# Patient Record
Sex: Male | Born: 1945 | Race: Black or African American | Hispanic: No | Marital: Married | Smoking: Former smoker
Health system: Southern US, Community
[De-identification: ages and names within clinical notes are randomized; demographics above are authoritative.]

## PROBLEM LIST (undated history)

## (undated) DIAGNOSIS — M1990 Unspecified osteoarthritis, unspecified site: Secondary | ICD-10-CM

## (undated) DIAGNOSIS — I9789 Other postprocedural complications and disorders of the circulatory system, not elsewhere classified: Secondary | ICD-10-CM

## (undated) DIAGNOSIS — M109 Gout, unspecified: Secondary | ICD-10-CM

## (undated) DIAGNOSIS — N183 Chronic kidney disease, stage 3 unspecified: Secondary | ICD-10-CM

## (undated) DIAGNOSIS — G4733 Obstructive sleep apnea (adult) (pediatric): Secondary | ICD-10-CM

## (undated) DIAGNOSIS — I4891 Unspecified atrial fibrillation: Secondary | ICD-10-CM

## (undated) DIAGNOSIS — I2510 Atherosclerotic heart disease of native coronary artery without angina pectoris: Secondary | ICD-10-CM

## (undated) DIAGNOSIS — E7800 Pure hypercholesterolemia, unspecified: Secondary | ICD-10-CM

## (undated) DIAGNOSIS — T8859XA Other complications of anesthesia, initial encounter: Secondary | ICD-10-CM

## (undated) DIAGNOSIS — I10 Essential (primary) hypertension: Secondary | ICD-10-CM

## (undated) DIAGNOSIS — E119 Type 2 diabetes mellitus without complications: Secondary | ICD-10-CM

## (undated) DIAGNOSIS — G4730 Sleep apnea, unspecified: Secondary | ICD-10-CM

## (undated) DIAGNOSIS — K635 Polyp of colon: Secondary | ICD-10-CM

## (undated) DIAGNOSIS — Z9989 Dependence on other enabling machines and devices: Secondary | ICD-10-CM

## (undated) DIAGNOSIS — N471 Phimosis: Secondary | ICD-10-CM

## (undated) DIAGNOSIS — I350 Nonrheumatic aortic (valve) stenosis: Secondary | ICD-10-CM

## (undated) DIAGNOSIS — R011 Cardiac murmur, unspecified: Secondary | ICD-10-CM

## (undated) DIAGNOSIS — I340 Nonrheumatic mitral (valve) insufficiency: Secondary | ICD-10-CM

## (undated) HISTORY — DX: Nonrheumatic mitral (valve) insufficiency: I34.0

## (undated) HISTORY — DX: Unspecified atrial fibrillation: I48.91

## (undated) HISTORY — DX: Gout, unspecified: M10.9

## (undated) HISTORY — DX: Chronic kidney disease, stage 3 (moderate): N18.3

## (undated) HISTORY — DX: Cardiac murmur, unspecified: R01.1

## (undated) HISTORY — DX: Other postprocedural complications and disorders of the circulatory system, not elsewhere classified: I97.89

## (undated) HISTORY — DX: Chronic kidney disease, stage 3 unspecified: N18.30

## (undated) HISTORY — DX: Essential (primary) hypertension: I10

## (undated) HISTORY — PX: EYE SURGERY: SHX253

## (undated) HISTORY — DX: Sleep apnea, unspecified: G47.30

## (undated) HISTORY — DX: Nonrheumatic aortic (valve) stenosis: I35.0

## (undated) HISTORY — DX: Polyp of colon: K63.5

## (undated) HISTORY — PX: CARDIAC CATHETERIZATION: SHX172

---

## 1998-08-11 ENCOUNTER — Ambulatory Visit (HOSPITAL_COMMUNITY): Admission: RE | Admit: 1998-08-11 | Discharge: 1998-08-11 | Payer: Self-pay | Admitting: *Deleted

## 2001-01-03 ENCOUNTER — Encounter: Admission: RE | Admit: 2001-01-03 | Discharge: 2001-02-22 | Payer: Self-pay | Admitting: Emergency Medicine

## 2006-07-26 HISTORY — PX: COLONOSCOPY: SHX174

## 2006-10-28 ENCOUNTER — Ambulatory Visit: Payer: Self-pay | Admitting: Gastroenterology

## 2006-11-04 ENCOUNTER — Ambulatory Visit: Payer: Self-pay | Admitting: Gastroenterology

## 2006-11-04 ENCOUNTER — Encounter (INDEPENDENT_AMBULATORY_CARE_PROVIDER_SITE_OTHER): Payer: Self-pay | Admitting: Specialist

## 2008-07-24 ENCOUNTER — Encounter: Admission: RE | Admit: 2008-07-24 | Discharge: 2008-07-24 | Payer: Self-pay | Admitting: Family Medicine

## 2010-12-08 ENCOUNTER — Encounter: Payer: BC Managed Care – PPO | Attending: Family Medicine | Admitting: *Deleted

## 2010-12-08 DIAGNOSIS — Z713 Dietary counseling and surveillance: Secondary | ICD-10-CM | POA: Insufficient documentation

## 2010-12-08 DIAGNOSIS — E119 Type 2 diabetes mellitus without complications: Secondary | ICD-10-CM | POA: Insufficient documentation

## 2011-07-16 ENCOUNTER — Ambulatory Visit (INDEPENDENT_AMBULATORY_CARE_PROVIDER_SITE_OTHER): Payer: BC Managed Care – PPO | Admitting: Family Medicine

## 2011-07-16 DIAGNOSIS — I10 Essential (primary) hypertension: Secondary | ICD-10-CM

## 2011-07-16 DIAGNOSIS — IMO0001 Reserved for inherently not codable concepts without codable children: Secondary | ICD-10-CM

## 2011-07-16 DIAGNOSIS — M109 Gout, unspecified: Secondary | ICD-10-CM

## 2011-08-03 ENCOUNTER — Ambulatory Visit: Payer: 59

## 2011-08-03 DIAGNOSIS — Z23 Encounter for immunization: Secondary | ICD-10-CM

## 2011-08-12 ENCOUNTER — Encounter: Payer: Self-pay | Admitting: Family Medicine

## 2011-08-12 DIAGNOSIS — G4733 Obstructive sleep apnea (adult) (pediatric): Secondary | ICD-10-CM | POA: Insufficient documentation

## 2011-08-12 DIAGNOSIS — M109 Gout, unspecified: Secondary | ICD-10-CM | POA: Insufficient documentation

## 2011-08-12 DIAGNOSIS — I10 Essential (primary) hypertension: Secondary | ICD-10-CM

## 2011-08-12 DIAGNOSIS — E7800 Pure hypercholesterolemia, unspecified: Secondary | ICD-10-CM | POA: Insufficient documentation

## 2011-08-12 DIAGNOSIS — E1165 Type 2 diabetes mellitus with hyperglycemia: Secondary | ICD-10-CM

## 2011-08-27 ENCOUNTER — Ambulatory Visit (INDEPENDENT_AMBULATORY_CARE_PROVIDER_SITE_OTHER): Payer: 59 | Admitting: Family Medicine

## 2011-08-27 ENCOUNTER — Encounter: Payer: Self-pay | Admitting: Family Medicine

## 2011-08-27 DIAGNOSIS — M109 Gout, unspecified: Secondary | ICD-10-CM

## 2011-08-27 DIAGNOSIS — E785 Hyperlipidemia, unspecified: Secondary | ICD-10-CM

## 2011-08-27 DIAGNOSIS — E1165 Type 2 diabetes mellitus with hyperglycemia: Secondary | ICD-10-CM

## 2011-08-27 DIAGNOSIS — E7800 Pure hypercholesterolemia, unspecified: Secondary | ICD-10-CM

## 2011-08-27 DIAGNOSIS — I10 Essential (primary) hypertension: Secondary | ICD-10-CM

## 2011-08-27 LAB — COMPREHENSIVE METABOLIC PANEL
Alkaline Phosphatase: 50 U/L (ref 39–117)
Creat: 1.35 mg/dL (ref 0.50–1.35)
Glucose, Bld: 205 mg/dL — ABNORMAL HIGH (ref 70–99)
Sodium: 141 mEq/L (ref 135–145)
Total Bilirubin: 0.4 mg/dL (ref 0.3–1.2)
Total Protein: 7.2 g/dL (ref 6.0–8.3)

## 2011-08-27 LAB — LIPID PANEL
Cholesterol: 157 mg/dL (ref 0–200)
Total CHOL/HDL Ratio: 3.7 Ratio

## 2011-08-27 MED ORDER — LOSARTAN POTASSIUM-HCTZ 100-12.5 MG PO TABS: 1.0000 | ORAL_TABLET | Freq: Every day | ORAL | Status: DC

## 2011-08-27 NOTE — Assessment & Plan Note (Addendum)
Will check home cbgs at various times, and rtc in about 6-7 wks to recheck hgb a1c  Work on diet for wt loss.  Paper rx given for testing strips

## 2011-08-27 NOTE — Progress Notes (Signed)
  Subjective:    Patient ID: Antonio Wells, male    DOB: Oct 23, 1945, 66 y.o.   MRN: [id number]  HPI Antonio Wells is a 66 y.o. male  DM - checking cbg 110-120 in am.  No symptomatic lows.  Rare missed dose metformin, increased to bid last ov due to A1c 9.1.  Exercise 5 d/week.    HTN - No outside BP's recorded, no missed doses. Cr 1.28-->1.39at 07/07/11 ov.  Fasting for labs today.  Gout - Uric acid 9.0 on 07/15/12.  2 flairs in past year,  Last one August. Hasn't purchased Colcrys yet due to cost. Would prefer to treat flairs only instead of daily med.   Review of Systems  Constitutional: Negative for fever, chills, diaphoresis and unexpected weight change.  Respiratory: Negative for cough, chest tightness and shortness of breath.   Cardiovascular: Negative for chest pain and palpitations.  Gastrointestinal: Negative for blood in stool.  Genitourinary: Negative for hematuria.  Musculoskeletal: Positive for arthralgias. Negative for gait problem.       R knee with bike riding, but no flairs of gout.  Knee pain few months.  Skin: Negative for rash.  Neurological: Negative for dizziness, syncope and light-headedness.  Hematological: Does not bruise/bleed easily.       Objective:   Physical Exam  Constitutional: He is oriented to person, place, and time. He appears well-developed and well-nourished.  HENT:  Head: Normocephalic and atraumatic.  Eyes: Pupils are equal, round, and reactive to light.  Neck: Normal range of motion. Neck supple. No JVD present.  Cardiovascular: Normal rate, regular rhythm and intact distal pulses.  Exam reveals no gallop and no friction rub.   No murmur heard.      No bruit.  Pulmonary/Chest: Effort normal and breath sounds normal. He has no rales.  Abdominal: Soft.  Musculoskeletal: Normal range of motion. He exhibits no edema and no tenderness.  Neurological: He is alert and oriented to person, place, and time.  Skin: Skin is warm and dry.    Psychiatric: He has a normal mood and affect.          Assessment & Plan:

## 2011-08-27 NOTE — Assessment & Plan Note (Addendum)
Uncontrolled for DM.  Will incr doses today of losartan.  Slight elevated creatinine last ov - will recheck today, and if still elevated, may need to check renal U/S, or stop the ARB

## 2011-08-27 NOTE — Assessment & Plan Note (Signed)
Tolerating lovastatin.  Has been taking last 3 weeks.  Fasting labs today.

## 2011-08-27 NOTE — Patient Instructions (Signed)
See handout.  Check blood sugars, and outside blood pressures.  Recheck office visit in 6-8 weeks, sooner if any new sx's, or worsening.  Gout Gout is an inflammatory condition (arthritis) caused by a buildup of uric acid crystals in the joints. Uric acid is a chemical that is normally present in the blood. Under some circumstances, uric acid can form into crystals in your joints. This causes joint redness, soreness, and swelling (inflammation). Repeat attacks are common. Over time, uric acid crystals can form into masses (tophi) near a joint, causing disfigurement. Gout is treatable and often preventable. CAUSES  The disease begins with elevated levels of uric acid in the blood. Uric acid is produced by your body when it breaks down a naturally found substance called purines. This also happens when you eat certain foods such as meats and fish. Causes of an elevated uric acid level include:  Being passed down from parent to child (heredity).   Diseases that cause increased uric acid production (obesity, psoriasis, some cancers).   Excessive alcohol use.   Diet, especially diets rich in meat and seafood.   Medicines, including certain cancer-fighting drugs (chemotherapy), diuretics, and aspirin.   Chronic kidney disease. The kidneys are no longer able to remove uric acid well.   Problems with metabolism.  Conditions strongly associated with gout include:  Obesity.   High blood pressure.   High cholesterol.   Diabetes.  Not everyone with elevated uric acid levels gets gout. It is not understood why some people get gout and others do not. Surgery, joint injury, and eating too much of certain foods are some of the factors that can lead to gout. SYMPTOMS   An attack of gout comes on quickly. It causes intense pain with redness, swelling, and warmth in a joint.   Fever can occur.   Often, only one joint is involved. Certain joints are more commonly involved:   Base of the big toe.    Knee.   Ankle.   Wrist.   Finger.  Without treatment, an attack usually goes away in a few days to weeks. Between attacks, you usually will not have symptoms, which is different from many other forms of arthritis. DIAGNOSIS  Your caregiver will suspect gout based on your symptoms and exam. Removal of fluid from the joint (arthrocentesis) is done to check for uric acid crystals. Your caregiver will give you a medicine that numbs the area (local anesthetic) and use a needle to remove joint fluid for exam. Gout is confirmed when uric acid crystals are seen in joint fluid, using a special microscope. Sometimes, blood, urine, and X-ray tests are also used. TREATMENT  There are 2 phases to gout treatment: treating the sudden onset (acute) attack and preventing attacks (prophylaxis). Treatment of an Acute Attack  Medicines are used. These include anti-inflammatory medicines or steroid medicines.   An injection of steroid medicine into the affected joint is sometimes necessary.   The painful joint is rested. Movement can worsen the arthritis.   You may use warm or cold treatments on painful joints, depending which works best for you.   Discuss the use of coffee, vitamin C, or cherries with your caregiver. These may be helpful treatment options.  Treatment to Prevent Attacks After the acute attack subsides, your caregiver may advise prophylactic medicine. These medicines either help your kidneys eliminate uric acid from your body or decrease your uric acid production. You may need to stay on these medicines for a very long time. The  early phase of treatment with prophylactic medicine can be associated with an increase in acute gout attacks. For this reason, during the first few months of treatment, your caregiver may also advise you to take medicines usually used for acute gout treatment. Be sure you understand your caregiver's directions. You should also discuss dietary treatment with your  caregiver. Certain foods such as meats and fish can increase uric acid levels. Other foods such as dairy can decrease levels. Your caregiver can give you a list of foods to avoid. HOME CARE INSTRUCTIONS   Do not take aspirin to relieve pain. This raises uric acid levels.   Only take over-the-counter or prescription medicines for pain, discomfort, or fever as directed by your caregiver.   Rest the joint as much as possible. When in bed, keep sheets and blankets off painful areas.   Keep the affected joint raised (elevated).   Use crutches if the painful joint is in your leg.   Drink enough water and fluids to keep your urine clear or pale yellow. This helps your body get rid of uric acid. Do not drink alcoholic beverages. They slow the passage of uric acid.   Follow your caregiver's dietary instructions. Pay careful attention to the amount of protein you eat. Your daily diet should emphasize fruits, vegetables, whole grains, and fat-free or low-fat milk products.   Maintain a healthy body weight.  SEEK MEDICAL CARE IF:   You have an oral temperature above 102 F (38.9 C).   You develop diarrhea, vomiting, or any side effects from medicines.   You do not feel better in 24 hours, or you are getting worse.  SEEK IMMEDIATE MEDICAL CARE IF:   Your joint becomes suddenly more tender and you have:   Chills.   An oral temperature above 102 F (38.9 C), not controlled by medicine.  MAKE SURE YOU:   Understand these instructions.   Will watch your condition.   Will get help right away if you are not doing well or get worse.  Document Released: 07/09/2000 Document Revised: 03/24/2011 Document Reviewed: 10/20/2009 [facility] Patient Information 2012 [place], [place].

## 2011-08-27 NOTE — Assessment & Plan Note (Addendum)
Discussed daily allopurinol.  Wishes to treat acute flairs with colcrys.  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 Paper Rx and coupon for 7days given.

## 2011-09-01 ENCOUNTER — Telehealth: Payer: Self-pay

## 2011-09-01 NOTE — Telephone Encounter (Signed)
.  [facility]  PT IS REQUESTING WE SEND HIS PRESCRIPTION FOR DIABETIC STRIPS TO [facility]  1 TOUCH ULTRA

## 2011-09-04 ENCOUNTER — Other Ambulatory Visit: Payer: Self-pay

## 2011-09-04 DIAGNOSIS — E119 Type 2 diabetes mellitus without complications: Secondary | ICD-10-CM

## 2011-09-04 MED ORDER — GLUCOSE BLOOD VI STRP: ORAL_STRIP | Status: DC

## 2011-09-04 NOTE — Telephone Encounter (Signed)
Please RF if indicated.

## 2011-10-06 ENCOUNTER — Other Ambulatory Visit: Payer: Self-pay | Admitting: Family Medicine

## 2011-10-22 ENCOUNTER — Encounter: Payer: Self-pay | Admitting: Family Medicine

## 2011-10-22 ENCOUNTER — Ambulatory Visit (INDEPENDENT_AMBULATORY_CARE_PROVIDER_SITE_OTHER): Payer: 59 | Admitting: Family Medicine

## 2011-10-22 VITALS — BP 135/78 | HR 76 | Temp 97.7°F | Resp 18 | Ht 62.5 in | Wt 204.0 lb

## 2011-10-22 DIAGNOSIS — I10 Essential (primary) hypertension: Secondary | ICD-10-CM

## 2011-10-22 DIAGNOSIS — E119 Type 2 diabetes mellitus without complications: Secondary | ICD-10-CM

## 2011-10-22 LAB — BASIC METABOLIC PANEL
CO2: 27 mEq/L (ref 19–32)
Chloride: 102 mEq/L (ref 96–112)
Potassium: 4 mEq/L (ref 3.5–5.3)
Sodium: 139 mEq/L (ref 135–145)

## 2011-10-22 LAB — POCT GLYCOSYLATED HEMOGLOBIN (HGB A1C): Hemoglobin A1C: 7.4

## 2011-10-22 MED ORDER — GLIPIZIDE 10 MG PO TABS: 10.0000 mg | ORAL_TABLET | Freq: Every day | ORAL | Status: DC

## 2011-10-22 MED ORDER — METFORMIN HCL 500 MG PO TABS: 500.0000 mg | ORAL_TABLET | Freq: Two times a day (BID) | ORAL | Status: DC

## 2011-10-22 NOTE — Progress Notes (Signed)
Subjective:    Patient ID: ABHIRAJ DOZAL, male    DOB: [birth year], 66 y.o.   MRN: [id number]  HPI DM- tolerating meds.  Home readings 145-165  Chol.-- no new myalgias, or se's.   Results for orders placed in visit on 08/27/11  LIPID PANEL      Component Value Range   Cholesterol 157  0 - 200 (mg/dL)   Triglycerides 956 (*) <150 (mg/dL)   HDL 42  >21 (mg/dL)   Total CHOL/HDL Ratio 3.7     VLDL 32  0 - 40 (mg/dL)   LDL Cholesterol 83  0 - 99 (mg/dL)  COMPREHENSIVE METABOLIC PANEL      Component Value Range   Sodium 141  135 - 145 (mEq/L)   Potassium 4.4  3.5 - 5.3 (mEq/L)   Chloride 103  96 - 112 (mEq/L)   CO2 28  19 - 32 (mEq/L)   Glucose, Bld 205 (*) 70 - 99 (mg/dL)   BUN 18  6 - 23 (mg/dL)   Creat 3.08  6.57 - 8.46 (mg/dL)   Total Bilirubin 0.4  0.3 - 1.2 (mg/dL)   Alkaline Phosphatase 50  39 - 117 (U/L)   AST 20  0 - 37 (U/L)   ALT 30  0 - 53 (U/L)   Total Protein 7.2  6.0 - 8.3 (g/dL)   Albumin 4.7  3.5 - 5.2 (g/dL)   Calcium 9.4  8.4 - 96.2 (mg/dL)    HTn - no recent outside BP's. Borderline creat last ov.  Trend 1.28 to 1.39 to 1.35.  Review of Systems  Constitutional: Negative for fatigue and unexpected weight change.  Eyes: Negative for visual disturbance.  Respiratory: Negative for cough, chest tightness and shortness of breath.        Minimal cough - occasional, not daily.  Cardiovascular: Negative for chest pain, palpitations and leg swelling.  Gastrointestinal: Negative for abdominal pain and blood in stool.  Neurological: Negative for dizziness, light-headedness and headaches.       Objective:   Physical Exam  Constitutional: He is oriented to person, place, and time. He appears well-developed and well-nourished.  HENT:  Head: Normocephalic and atraumatic.  Eyes: EOM are normal. Pupils are equal, round, and reactive to light.  Neck: No JVD present. Carotid bruit is not present.  Cardiovascular: Normal rate, regular rhythm, normal heart sounds and  intact distal pulses.   No murmur heard. Pulmonary/Chest: Effort normal and breath sounds normal. He has no rales.  Abdominal: Soft. There is no tenderness.  Musculoskeletal: He exhibits no edema.  Neurological: He is alert and oriented to person, place, and time.       Microfilament testing of feet normal bilaterally.  Skin: Skin is warm, dry and intact. No rash noted.  Psychiatric: He has a normal mood and affect. His behavior is normal.   Results for orders placed in visit on 10/22/11  GLUCOSE, POCT (MANUAL RESULT ENTRY)      Component Value Range   POC Glucose 208    POCT GLYCOSYLATED HEMOGLOBIN (HGB A1C)      Component Value Range   Hemoglobin A1C 7.4           Assessment & Plan:   PAZ WINSETT is a 66 y.o. male 1. DM2 (diabetes mellitus, type 2)  POCT glucose (manual entry), POCT glycosylated hemoglobin (Hb A1C)  2. HTN (hypertension)  Basic metabolic panel    DM -  Still uncontrolled, but improving.  Cont same doses of  meds, including metformin, glipizide and ARB, and work on diet, and keep record of cbg's.   Recheck 3 months.  HTN, borderline creat prior.  Improved control.  Check BMP.  Recheck in 3 months.

## 2011-10-22 NOTE — Patient Instructions (Signed)
Continue  To watch diet and same meds for now.   REcheck in 3 months.

## 2011-11-08 ENCOUNTER — Encounter: Payer: Self-pay | Admitting: Gastroenterology

## 2011-11-24 ENCOUNTER — Other Ambulatory Visit: Payer: Self-pay | Admitting: Family Medicine

## 2011-11-29 ENCOUNTER — Other Ambulatory Visit: Payer: Self-pay

## 2012-01-30 ENCOUNTER — Other Ambulatory Visit: Payer: Self-pay | Admitting: Family Medicine

## 2012-01-31 ENCOUNTER — Other Ambulatory Visit: Payer: Self-pay | Admitting: Family Medicine

## 2012-02-11 ENCOUNTER — Encounter: Payer: Self-pay | Admitting: Family Medicine

## 2012-02-11 ENCOUNTER — Ambulatory Visit (INDEPENDENT_AMBULATORY_CARE_PROVIDER_SITE_OTHER): Payer: 59 | Admitting: Family Medicine

## 2012-02-11 VITALS — BP 136/79 | HR 77 | Temp 97.7°F | Resp 16 | Ht 63.0 in | Wt 207.0 lb

## 2012-02-11 DIAGNOSIS — E1165 Type 2 diabetes mellitus with hyperglycemia: Secondary | ICD-10-CM

## 2012-02-11 DIAGNOSIS — I10 Essential (primary) hypertension: Secondary | ICD-10-CM

## 2012-02-11 LAB — POCT GLYCOSYLATED HEMOGLOBIN (HGB A1C): Hemoglobin A1C: 7.4

## 2012-02-11 LAB — BASIC METABOLIC PANEL
BUN: 17 mg/dL (ref 6–23)
Calcium: 9.5 mg/dL (ref 8.4–10.5)
Creat: 1.3 mg/dL (ref 0.50–1.35)

## 2012-02-11 LAB — GLUCOSE, POCT (MANUAL RESULT ENTRY): POC Glucose: 149 mg/dl — AB (ref 70–99)

## 2012-02-11 NOTE — Patient Instructions (Signed)
Your should receive a call or letter about your lab results within the next week to 10 days.  Continue same doses of meds for now, but work on diet and exercise for weight loss.  If A1c remains elevated at next office visit we may need to change doses.  Keep a record of your blood pressures outside of the office and bring them to the next office visit.

## 2012-02-11 NOTE — Progress Notes (Signed)
Subjective:    Patient ID: Antonio Wells, male    DOB: [birth year], 66 y.o.   MRN: [id number]  HPI Antonio Wells is a 66 y.o. male HTn - hx borderline creat.  Trend 1.28 to 1.39 to 1.35.  1.41 last ov. Drinking water frequently.  No difficulty urinating.  Physical in next few months. No new side effects with meds.  No outside BP readings.  Dm2 - last labs 3/29:  No recent home blood sugars. Did cut back on exercise due to knee pain.  Restarting exercise recently. No missed doses.    Results for orders placed in visit on 10/22/11  GLUCOSE, POCT (MANUAL RESULT ENTRY)      Component Value Range   POC Glucose 208    POCT GLYCOSYLATED HEMOGLOBIN (HGB A1C)      Component Value Range   Hemoglobin A1C 7.4    BASIC METABOLIC PANEL      Component Value Range   Sodium 139  135 - 145 mEq/L   Potassium 4.0  3.5 - 5.3 mEq/L   Chloride 102  96 - 112 mEq/L   CO2 27  19 - 32 mEq/L   Glucose, Bld 230 (*) 70 - 99 mg/dL   BUN 16  6 - 23 mg/dL   Creat 4.09 (*) 8.11 - 1.35 mg/dL   Calcium 9.3  8.4 - 91.4 mg/dL   Review of Systems  Constitutional: Negative for fatigue and unexpected weight change.  Eyes: Negative for visual disturbance.  Respiratory: Negative for cough, chest tightness and shortness of breath.   Cardiovascular: Negative for chest pain, palpitations and leg swelling.  Gastrointestinal: Negative for abdominal pain and blood in stool.  Neurological: Negative for dizziness, light-headedness and headaches.       Objective:   Physical Exam  Constitutional: He is oriented to person, place, and time. He appears well-developed and well-nourished.  HENT:  Head: Normocephalic and atraumatic.  Eyes: Pupils are equal, round, and reactive to light.  Cardiovascular: Normal rate, regular rhythm, normal heart sounds and intact distal pulses.   Pulmonary/Chest: Effort normal and breath sounds normal.  Abdominal: Soft. Normal appearance. He exhibits no abdominal bruit and no pulsatile  midline mass. There is no tenderness.  Neurological: He is alert and oriented to person, place, and time.       Microfilament testing of feet normal bilaterally.  Skin: Skin is warm, dry and intact. No rash noted.  Psychiatric: He has a normal mood and affect. His behavior is normal.    Results for orders placed in visit on 02/11/12  GLUCOSE, POCT (MANUAL RESULT ENTRY)      Component Value Range   POC Glucose 149 (*) 70 - 99 mg/dl  POCT GLYCOSYLATED HEMOGLOBIN (HGB A1C)      Component Value Range   Hemoglobin A1C 7.4         Assessment & Plan:  Antonio Wells is a 66 y.o. male  HTN - borderline high, with prior borderline creat.  Recheck BMP.  Outside readings to next ov.  Cont same doses for now.  unknown if rf's needed - ok to refill x 6 months on meds.     DM2 - uncontrolled, slight weight gain with decrease in exercise, but now restarting exercise regimen.  Can keep doses same if working on weight loss and exercise, but consider increasing next ov in 3 months if still elevated.    Plan on lipids at next ov - schedule physical in next 3 months.

## 2012-02-18 ENCOUNTER — Other Ambulatory Visit: Payer: Self-pay | Admitting: Family Medicine

## 2012-02-18 NOTE — Telephone Encounter (Signed)
PT STATES HE HAVE GOUT AND WOULD LIKE TO HAVE SOMETHING CALLED IN. PLEASE CALL PT AT [phone]    [facility] [address]

## 2012-02-20 ENCOUNTER — Telehealth: Payer: Self-pay | Admitting: *Deleted

## 2012-02-20 MED ORDER — COLCHICINE 0.6 MG PO TABS: ORAL_TABLET | ORAL | Status: DC

## 2012-02-20 NOTE — Telephone Encounter (Signed)
Con Memos Kaba 02/18/2012 11:33 AM Signed  PT STATES HE HAVE GOUT AND WOULD LIKE TO HAVE SOMETHING CALLED IN. PLEASE CALL PT AT [phone]  [facility] [address]

## 2012-02-20 NOTE — Telephone Encounter (Signed)
Sent colcrys into pharmacy

## 2012-02-20 NOTE — Telephone Encounter (Signed)
PT NOTIFIED THAT RX WAS SENT INTO PHARMACY 

## 2012-04-25 ENCOUNTER — Other Ambulatory Visit: Payer: Self-pay | Admitting: Physician Assistant

## 2012-04-25 ENCOUNTER — Other Ambulatory Visit: Payer: Self-pay | Admitting: Family Medicine

## 2012-04-29 ENCOUNTER — Other Ambulatory Visit: Payer: Self-pay | Admitting: Physician Assistant

## 2012-04-30 ENCOUNTER — Other Ambulatory Visit: Payer: Self-pay | Admitting: Physician Assistant

## 2012-05-08 ENCOUNTER — Encounter: Payer: 59 | Admitting: Family Medicine

## 2012-05-12 ENCOUNTER — Encounter: Payer: 59 | Admitting: Family Medicine

## 2012-06-09 ENCOUNTER — Other Ambulatory Visit: Payer: Self-pay | Admitting: Family Medicine

## 2012-07-03 ENCOUNTER — Encounter: Payer: Self-pay | Admitting: Family Medicine

## 2012-07-03 ENCOUNTER — Ambulatory Visit (INDEPENDENT_AMBULATORY_CARE_PROVIDER_SITE_OTHER): Payer: 59 | Admitting: Family Medicine

## 2012-07-03 VITALS — BP 128/70 | HR 69 | Temp 98.3°F | Resp 16 | Ht 62.75 in | Wt 204.4 lb

## 2012-07-03 DIAGNOSIS — E1165 Type 2 diabetes mellitus with hyperglycemia: Secondary | ICD-10-CM

## 2012-07-03 DIAGNOSIS — Z23 Encounter for immunization: Secondary | ICD-10-CM

## 2012-07-03 DIAGNOSIS — M25559 Pain in unspecified hip: Secondary | ICD-10-CM

## 2012-07-03 DIAGNOSIS — Z0000 Encounter for general adult medical examination without abnormal findings: Secondary | ICD-10-CM

## 2012-07-03 DIAGNOSIS — M109 Gout, unspecified: Secondary | ICD-10-CM

## 2012-07-03 DIAGNOSIS — IMO0002 Reserved for concepts with insufficient information to code with codable children: Secondary | ICD-10-CM

## 2012-07-03 DIAGNOSIS — M25569 Pain in unspecified knee: Secondary | ICD-10-CM

## 2012-07-03 DIAGNOSIS — E785 Hyperlipidemia, unspecified: Secondary | ICD-10-CM

## 2012-07-03 DIAGNOSIS — I10 Essential (primary) hypertension: Secondary | ICD-10-CM

## 2012-07-03 LAB — COMPREHENSIVE METABOLIC PANEL
ALT: 31 U/L (ref 0–53)
Albumin: 4.8 g/dL (ref 3.5–5.2)
Alkaline Phosphatase: 49 U/L (ref 39–117)
CO2: 29 mEq/L (ref 19–32)
Glucose, Bld: 179 mg/dL — ABNORMAL HIGH (ref 70–99)
Potassium: 4.1 mEq/L (ref 3.5–5.3)
Sodium: 140 mEq/L (ref 135–145)
Total Bilirubin: 0.5 mg/dL (ref 0.3–1.2)
Total Protein: 7.1 g/dL (ref 6.0–8.3)

## 2012-07-03 LAB — CBC WITH DIFFERENTIAL/PLATELET
Eosinophils Absolute: 0.1 10*3/uL (ref 0.0–0.7)
Hemoglobin: 14.5 g/dL (ref 13.0–17.0)
Lymphs Abs: 2 10*3/uL (ref 0.7–4.0)
MCH: 30.5 pg (ref 26.0–34.0)
Monocytes Relative: 7 % (ref 3–12)
Neutro Abs: 1.3 10*3/uL — ABNORMAL LOW (ref 1.7–7.7)
Neutrophils Relative %: 35 % — ABNORMAL LOW (ref 43–77)
Platelets: 221 10*3/uL (ref 150–400)
RBC: 4.76 MIL/uL (ref 4.22–5.81)
WBC: 3.6 10*3/uL — ABNORMAL LOW (ref 4.0–10.5)

## 2012-07-03 LAB — GLUCOSE, POCT (MANUAL RESULT ENTRY): POC Glucose: 175 mg/dl — AB (ref 70–99)

## 2012-07-03 LAB — POCT GLYCOSYLATED HEMOGLOBIN (HGB A1C): Hemoglobin A1C: 7.9

## 2012-07-03 LAB — LIPID PANEL
Cholesterol: 159 mg/dL (ref 0–200)
LDL Cholesterol: 81 mg/dL (ref 0–99)
VLDL: 30 mg/dL (ref 0–40)

## 2012-07-03 LAB — IFOBT (OCCULT BLOOD): IFOBT: NEGATIVE

## 2012-07-03 MED ORDER — FELODIPINE ER 10 MG PO TB24: 10.0000 mg | ORAL_TABLET | Freq: Every day | ORAL | Status: DC

## 2012-07-03 MED ORDER — METOPROLOL SUCCINATE ER 100 MG PO TB24: 100.0000 mg | ORAL_TABLET | Freq: Every day | ORAL | Status: DC

## 2012-07-03 MED ORDER — LOVASTATIN 20 MG PO TABS: 20.0000 mg | ORAL_TABLET | Freq: Every day | ORAL | Status: DC

## 2012-07-03 MED ORDER — LOSARTAN POTASSIUM-HCTZ 100-12.5 MG PO TABS: 1.0000 | ORAL_TABLET | Freq: Every day | ORAL | Status: DC

## 2012-07-03 MED ORDER — METFORMIN HCL 500 MG PO TABS: ORAL_TABLET | ORAL | Status: DC

## 2012-07-03 MED ORDER — COLCHICINE 0.6 MG PO TABS: ORAL_TABLET | ORAL | Status: DC

## 2012-07-03 MED ORDER — GLIPIZIDE 10 MG PO TABS: 10.0000 mg | ORAL_TABLET | Freq: Two times a day (BID) | ORAL | Status: DC

## 2012-07-03 NOTE — Progress Notes (Signed)
Subjective:    Patient ID: Antonio Wells, male    DOB: [birth year], 66 y.o.   MRN: [id number]  HPI Antonio Wells is a 66 y.o. male Hx of HTN, DM2, hyperlipidemia, gout, OSA.  Here for CPE.  Colonoscopy 5 years ago - due for repeat this year. Due for   DM2 - elevated last ov, but planning on diet and exercise changes.  No recent blood sugar checks.  No new side effects with meds. Only rare cough. - less than in past. Has not changed much in weight, minimal diet change.  Last optho visit - 1 year ago. Dentist: last visit 1 year ago.  Will have EKG today.  Results for orders placed in visit on 02/11/12  BASIC METABOLIC PANEL      Component Value Range   Sodium 140  135 - 145 mEq/L   Potassium 4.1  3.5 - 5.3 mEq/L   Chloride 104  96 - 112 mEq/L   CO2 26  19 - 32 mEq/L   Glucose, Bld 149 (*) 70 - 99 mg/dL   BUN 17  6 - 23 mg/dL   Creat 0.86  5.78 - 4.69 mg/dL   Calcium 9.5  8.4 - 62.9 mg/dL  GLUCOSE, POCT (MANUAL RESULT ENTRY)      Component Value Range   POC Glucose 149 (*) 70 - 99 mg/dl  POCT GLYCOSYLATED HEMOGLOBIN (HGB A1C)      Component Value Range   Hemoglobin A1C 7.4    has had some hip and knee pains at times. No attempted treatments. Hip pains for over a year - worse at times - noted with walking [place] theme park recently. 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  R knee pain without injury - on and off for few years,more recently and notes with going down stairs more than up.This has impacted exercise. Rare alleve - minimal relief.   No outside blood pressures.   Rare dry cough - better than in past when on different blood pressure med.  Hx of tobacco abuse in past - quit in 90's.  Brother had lung cancer- deceased in 12-25-2004.   Review of Systems  Constitutional: Negative for fatigue and unexpected weight change.  Eyes: Negative for visual disturbance.  Respiratory: Negative for cough (minimal - improving. ), chest tightness and shortness of breath.    Cardiovascular: Negative for chest pain, palpitations and leg swelling.  Gastrointestinal: Negative for abdominal pain and blood in stool.  Musculoskeletal: Positive for arthralgias.  Neurological: Negative for dizziness, light-headedness and headaches.  other per CMA note form Patient health survey.   EKG: L axis, nonspecific t waves inferiorly, but seen in Dec 26, 2002.     Objective:   Physical Exam  Constitutional: He is oriented to person, place, and time. He appears well-developed and well-nourished.  HENT:  Head: Normocephalic and atraumatic.  Right Ear: External ear normal.  Left Ear: External ear normal.  Mouth/Throat: Oropharynx is clear and moist.  Eyes: Conjunctivae normal and EOM are normal. Pupils are equal, round, and reactive to light.  Neck: Normal range of motion. Neck supple. No thyromegaly present.  Cardiovascular: Normal rate, regular rhythm, normal heart sounds and intact distal pulses.   Pulmonary/Chest: Effort normal and breath sounds normal. No respiratory distress. He has no wheezes.  Abdominal: Soft. He exhibits no distension. There is no tenderness. Hernia confirmed negative in the right inguinal area and confirmed negative in the left inguinal area.  Genitourinary: Prostate normal.  Musculoskeletal: Normal range of motion. He exhibits no edema and no tenderness.  Lymphadenopathy:    He has no cervical adenopathy.  Neurological: He is alert and oriented to person, place, and time. He has normal reflexes.  Skin: Skin is warm and dry.  Psychiatric: He has a normal mood and affect. His behavior is normal.      Results for orders placed in visit on 07/03/12  GLUCOSE, POCT (MANUAL RESULT ENTRY)      Component Value Range   POC Glucose 175 (*) 70 - 99 mg/dl  POCT GLYCOSYLATED HEMOGLOBIN (HGB A1C)      Component Value Range   Hemoglobin A1C 7.9    IFOBT (OCCULT BLOOD)      Component Value Range   IFOBT Negative         Assessment & Plan:  Antonio Wells  is a 66 y.o. male 1. Need for prophylactic vaccination and inoculation against influenza  Flu vaccine greater than or equal to 3yo preservative free IM  2. Annual physical exam  IFOBT POC (occult bld, rslt in office), CBC with Differential, PSA, TSH  3. HTN (hypertension)  felodipine (PLENDIL) 10 MG 24 hr tablet, losartan-hydrochlorothiazide (HYZAAR) 100-12.5 MG per tablet, metoprolol succinate (TOPROL-XL) 100 MG 24 hr tablet, Comprehensive metabolic panel  4. DM (diabetes mellitus), type 2, uncontrolled  glipiZIDE (GLUCOTROL) 10 MG tablet, POCT glucose (manual entry), POCT glycosylated hemoglobin (Hb A1C), metFORMIN (GLUCOPHAGE) 500 MG tablet  5. Gout  colchicine 0.6 MG tablet, Uric Acid  6. Knee pain    7. Hip pain    8. Hyperlipidemia  lovastatin (MEVACOR) 20 MG tablet, Lipid panel   CPE -  Pt will call and schedule repeat colonoscopy.  Flu vaccine given today, tdap, and pneumovax given.  Discussed zostavax - would like to check into this first - i can Rx this without ov if he wants to receive this.  Check labs above.   HTN - controlled. Continue same meds.   DM2 - uncontrolled - increase metformin to 1000mg  in the morning, 500mg  qhs - see rx. Recheck in 3 months.   Gout - no recent attack.  Hip, knee pain more likely OA, but gout in ddx. .  Will check uric acid.  Rare dry cough - FH of lung cancer, and remote tobacco abuse - recommended cxr - declined at present, but encouraged to rtc if he changes his mind or certainly if any new pulmonary sx's. Understanding expressed.   Bilaterral hip pain and R knee pain - longstanding.  Suspected arthritic change, can try otc tylenol and aquatic exercise, quad strengthening,  and recheck in next few weeks for possible xray and further eval.  Patient Instructions  Call your gastroenterologist to schedule the colonoscopy.  Your should receive a call or letter about your lab results within the next week to 10 days.  Increase the metformin to 2 each  morning, 1 each night. Recheck levels in 3 months.  Try tylenol for your knee and hip pain, water exercise as discussed and recheck in the next few weeks to discuss further.  Keeping you healthy  Get these tests  Blood pressure- Have your blood pressure checked once a year by your healthcare provider.  Normal blood pressure is 120/80  Weight- Have your body mass index (BMI) calculated to screen for obesity.  BMI is a measure of body fat based on height and weight. You can also calculate your own BMI at [email or web address].  Cholesterol- Have your cholesterol checked  every year.  Diabetes- Have your blood sugar checked regularly if you have high blood pressure, high cholesterol, have a family history of diabetes or if you are overweight.  Screening for Colon Cancer- Colonoscopy starting at age 60.  Screening may begin sooner depending on your family history and other health conditions. Follow up colonoscopy as directed by your Gastroenterologist.  Screening for Prostate Cancer- Both blood work (PSA) and a rectal exam help screen for Prostate Cancer.  Screening begins at age 6 with African-American men and at age 58 with Caucasian men.  Screening may begin sooner depending on your family history.  Take these medicines  Aspirin- One aspirin daily can help prevent Heart disease and Stroke.  Flu shot- Every fall.  Tetanus- Every 10 years.  Zostavax- Once after the age of 26 to prevent Shingles.  Pneumonia shot- Once after the age of 45; if you are younger than 8, ask your healthcare provider if you need a Pneumonia shot.  Take these steps  Don't smoke- If you do smoke, talk to your doctor about quitting.  For tips on how to quit, go to www.smokefree.gov or call 1-800-QUIT-NOW.  Be physically active- Exercise 5 days a week for at least 30 minutes.  If you are not already physically active start slow and gradually work up to 30 minutes of moderate physical activity.  Examples of  moderate activity include walking briskly, mowing the yard, dancing, swimming, bicycling, etc.  Eat a healthy diet- Eat a variety of healthy food such as fruits, vegetables, low fat milk, low fat cheese, yogurt, lean meant, poultry, fish, beans, tofu, etc. For more information go to www.thenutritionsource.org  Drink alcohol in moderation- Limit alcohol intake to less than two drinks a day. Never drink and drive.  Dentist- Brush and floss twice daily; visit your dentist twice a year.  Depression- Your emotional health is as important as your physical health. If you're feeling down, or losing interest in things you would normally enjoy please talk to your healthcare provider.  Eye exam- Visit your eye doctor every year.  Safe sex- If you may be exposed to a sexually transmitted infection, use a condom.  Seat belts- Seat belts can save your life; always wear one.  Smoke/Carbon Monoxide detectors- These detectors need to be installed on the appropriate level of your home.  Replace batteries at least once a year.  Skin cancer- When out in the sun, cover up and use sunscreen 15 SPF or higher.  Violence- If anyone is threatening you, please tell your healthcare provider.  Living Will/ Health care power of attorney- Speak with your healthcare provider and family.

## 2012-07-03 NOTE — Progress Notes (Signed)
  Subjective:    Patient ID: Antonio Wells, male    DOB: 1945/09/24, 66 y.o.   MRN: [id number]  HPI    Review of Systems  Constitutional: Negative.   HENT: Negative.   Eyes: Negative.   Respiratory: Negative.   Cardiovascular: Negative.   Gastrointestinal: Negative.   Genitourinary: Negative.   Musculoskeletal: Negative.   Skin: Negative.   Neurological: Negative.   Hematological: Negative.   Psychiatric/Behavioral: Negative.        Objective:   Physical Exam        Assessment & Plan:

## 2012-07-03 NOTE — Patient Instructions (Addendum)
Call your gastroenterologist to schedule the colonoscopy.  Your should receive a call or letter about your lab results within the next week to 10 days.  Increase the metformin to 2 each morning, 1 each night. Recheck levels in 3 months.  Try tylenol for your knee and hip pain, water exercise as discussed and recheck in the next few weeks to discuss further.  Keeping you healthy  Get these tests  Blood pressure- Have your blood pressure checked once a year by your healthcare provider.  Normal blood pressure is 120/80  Weight- Have your body mass index (BMI) calculated to screen for obesity.  BMI is a measure of body fat based on height and weight. You can also calculate your own BMI at [email or web address].  Cholesterol- Have your cholesterol checked every year.  Diabetes- Have your blood sugar checked regularly if you have high blood pressure, high cholesterol, have a family history of diabetes or if you are overweight.  Screening for Colon Cancer- Colonoscopy starting at age 5.  Screening may begin sooner depending on your family history and other health conditions. Follow up colonoscopy as directed by your Gastroenterologist.  Screening for Prostate Cancer- Both blood work (PSA) and a rectal exam help screen for Prostate Cancer.  Screening begins at age 32 with African-American men and at age 28 with Caucasian men.  Screening may begin sooner depending on your family history.  Take these medicines  Aspirin- One aspirin daily can help prevent Heart disease and Stroke.  Flu shot- Every fall.  Tetanus- Every 10 years.  Zostavax- Once after the age of 49 to prevent Shingles.  Pneumonia shot- Once after the age of 71; if you are younger than 37, ask your healthcare provider if you need a Pneumonia shot.  Take these steps  Don't smoke- If you do smoke, talk to your doctor about quitting.  For tips on how to quit, go to www.smokefree.gov or call 1-800-QUIT-NOW.  Be physically  active- Exercise 5 days a week for at least 30 minutes.  If you are not already physically active start slow and gradually work up to 30 minutes of moderate physical activity.  Examples of moderate activity include walking briskly, mowing the yard, dancing, swimming, bicycling, etc.  Eat a healthy diet- Eat a variety of healthy food such as fruits, vegetables, low fat milk, low fat cheese, yogurt, lean meant, poultry, fish, beans, tofu, etc. For more information go to www.thenutritionsource.org  Drink alcohol in moderation- Limit alcohol intake to less than two drinks a day. Never drink and drive.  Dentist- Brush and floss twice daily; visit your dentist twice a year.  Depression- Your emotional health is as important as your physical health. If you're feeling down, or losing interest in things you would normally enjoy please talk to your healthcare provider.  Eye exam- Visit your eye doctor every year.  Safe sex- If you may be exposed to a sexually transmitted infection, use a condom.  Seat belts- Seat belts can save your life; always wear one.  Smoke/Carbon Monoxide detectors- These detectors need to be installed on the appropriate level of your home.  Replace batteries at least once a year.  Skin cancer- When out in the sun, cover up and use sunscreen 15 SPF or higher.  Violence- If anyone is threatening you, please tell your healthcare provider.  Living Will/ Health care power of attorney- Speak with your healthcare provider and family.

## 2012-07-04 LAB — TSH: TSH: 3.132 u[IU]/mL (ref 0.350–4.500)

## 2012-07-04 LAB — URIC ACID: Uric Acid, Serum: 8.8 mg/dL — ABNORMAL HIGH (ref 4.0–7.8)

## 2012-07-10 ENCOUNTER — Other Ambulatory Visit: Payer: Self-pay | Admitting: Radiology

## 2012-07-10 DIAGNOSIS — M109 Gout, unspecified: Secondary | ICD-10-CM

## 2012-07-10 MED ORDER — COLCHICINE 0.6 MG PO TABS: ORAL_TABLET | ORAL | Status: DC

## 2012-08-18 ENCOUNTER — Encounter: Payer: Self-pay | Admitting: Gastroenterology

## 2012-08-31 ENCOUNTER — Encounter: Payer: Self-pay | Admitting: Gastroenterology

## 2012-10-05 ENCOUNTER — Encounter: Payer: Self-pay | Admitting: Gastroenterology

## 2012-12-02 ENCOUNTER — Other Ambulatory Visit: Payer: Self-pay | Admitting: Family Medicine

## 2012-12-03 NOTE — Telephone Encounter (Signed)
Pt was due for OV, labs in March 2014

## 2012-12-07 ENCOUNTER — Other Ambulatory Visit: Payer: Self-pay | Admitting: Family Medicine

## 2012-12-07 NOTE — Telephone Encounter (Signed)
Pt last seen Dec 2013, per last note was due for labs March 2014

## 2013-01-03 ENCOUNTER — Other Ambulatory Visit: Payer: Self-pay

## 2013-01-03 DIAGNOSIS — I10 Essential (primary) hypertension: Secondary | ICD-10-CM

## 2013-01-03 DIAGNOSIS — E785 Hyperlipidemia, unspecified: Secondary | ICD-10-CM

## 2013-01-03 MED ORDER — LOVASTATIN 20 MG PO TABS: 20.0000 mg | ORAL_TABLET | Freq: Every day | ORAL | Status: DC

## 2013-01-03 MED ORDER — LOSARTAN POTASSIUM-HCTZ 100-12.5 MG PO TABS: 1.0000 | ORAL_TABLET | Freq: Every day | ORAL | Status: DC

## 2013-05-31 ENCOUNTER — Emergency Department (HOSPITAL_COMMUNITY)
Admission: EM | Admit: 2013-05-31 | Discharge: 2013-05-31 | Disposition: A | Discharge status: Home / Self Care | Payer: Medicare Other | Attending: Emergency Medicine | Admitting: Emergency Medicine

## 2013-05-31 ENCOUNTER — Encounter (HOSPITAL_COMMUNITY): Payer: Self-pay | Admitting: Emergency Medicine

## 2013-05-31 ENCOUNTER — Emergency Department (HOSPITAL_COMMUNITY): Payer: Medicare Other

## 2013-05-31 DIAGNOSIS — R011 Cardiac murmur, unspecified: Secondary | ICD-10-CM | POA: Diagnosis not present

## 2013-05-31 DIAGNOSIS — IMO0002 Reserved for concepts with insufficient information to code with codable children: Secondary | ICD-10-CM

## 2013-05-31 DIAGNOSIS — Z7982 Long term (current) use of aspirin: Secondary | ICD-10-CM | POA: Diagnosis not present

## 2013-05-31 DIAGNOSIS — Z79899 Other long term (current) drug therapy: Secondary | ICD-10-CM | POA: Insufficient documentation

## 2013-05-31 DIAGNOSIS — R51 Headache: Secondary | ICD-10-CM | POA: Diagnosis not present

## 2013-05-31 DIAGNOSIS — Z8669 Personal history of other diseases of the nervous system and sense organs: Secondary | ICD-10-CM | POA: Insufficient documentation

## 2013-05-31 DIAGNOSIS — E7800 Pure hypercholesterolemia, unspecified: Secondary | ICD-10-CM | POA: Insufficient documentation

## 2013-05-31 DIAGNOSIS — R42 Dizziness and giddiness: Secondary | ICD-10-CM | POA: Diagnosis not present

## 2013-05-31 DIAGNOSIS — I10 Essential (primary) hypertension: Secondary | ICD-10-CM | POA: Diagnosis not present

## 2013-05-31 DIAGNOSIS — Z87891 Personal history of nicotine dependence: Secondary | ICD-10-CM | POA: Diagnosis not present

## 2013-05-31 DIAGNOSIS — R739 Hyperglycemia, unspecified: Secondary | ICD-10-CM

## 2013-05-31 DIAGNOSIS — E119 Type 2 diabetes mellitus without complications: Secondary | ICD-10-CM | POA: Diagnosis not present

## 2013-05-31 DIAGNOSIS — E1165 Type 2 diabetes mellitus with hyperglycemia: Secondary | ICD-10-CM

## 2013-05-31 DIAGNOSIS — Z880 Allergy status to penicillin: Secondary | ICD-10-CM | POA: Insufficient documentation

## 2013-05-31 HISTORY — DX: Pure hypercholesterolemia, unspecified: E78.00

## 2013-05-31 LAB — RAPID URINE DRUG SCREEN, HOSP PERFORMED
Barbiturates: NOT DETECTED
Benzodiazepines: NOT DETECTED
Opiates: NOT DETECTED

## 2013-05-31 LAB — CBC WITH DIFFERENTIAL/PLATELET
Basophils Absolute: 0 10*3/uL (ref 0.0–0.1)
Basophils Relative: 0 % (ref 0–1)
Eosinophils Absolute: 0.1 10*3/uL (ref 0.0–0.7)
Eosinophils Relative: 1 % (ref 0–5)
HCT: 41.6 % (ref 39.0–52.0)
MCH: 32.3 pg (ref 26.0–34.0)
MCHC: 35.8 g/dL (ref 30.0–36.0)
Monocytes Absolute: 0.4 10*3/uL (ref 0.1–1.0)
Monocytes Relative: 8 % (ref 3–12)
Neutro Abs: 1.2 10*3/uL — ABNORMAL LOW (ref 1.7–7.7)
RDW: 13.3 % (ref 11.5–15.5)

## 2013-05-31 LAB — URINALYSIS, ROUTINE W REFLEX MICROSCOPIC
Glucose, UA: >1000 mg/dL — AB
Ketones, ur: NEGATIVE mg/dL
Leukocytes, UA: NEGATIVE
Specific Gravity, Urine: 1.026 (ref 1.005–1.030)
pH: 5.5 (ref 5.0–8.0)

## 2013-05-31 LAB — COMPREHENSIVE METABOLIC PANEL
Albumin: 4.1 g/dL (ref 3.5–5.2)
Alkaline Phosphatase: 57 U/L (ref 39–117)
BUN: 18 mg/dL (ref 6–23)
CO2: 23 mEq/L (ref 19–32)
Creatinine, Ser: 1.28 mg/dL (ref 0.50–1.35)
GFR calc Af Amer: 66 mL/min — ABNORMAL LOW (ref >90)
GFR calc non Af Amer: 57 mL/min — ABNORMAL LOW (ref >90)
Glucose, Bld: 364 mg/dL — ABNORMAL HIGH (ref 70–99)
Potassium: 4.2 mEq/L (ref 3.5–5.1)
Total Bilirubin: 0.2 mg/dL — ABNORMAL LOW (ref 0.3–1.2)
Total Protein: 7 g/dL (ref 6.0–8.3)

## 2013-05-31 LAB — PROTIME-INR
INR: 0.93 (ref 0.00–1.49)
Prothrombin Time: 12.3 seconds (ref 11.6–15.2)

## 2013-05-31 LAB — POCT I-STAT, CHEM 8
Creatinine, Ser: 1.6 mg/dL — ABNORMAL HIGH (ref 0.50–1.35)
HCT: 45 % (ref 39.0–52.0)
Hemoglobin: 15.3 g/dL (ref 13.0–17.0)
Potassium: 3.5 mEq/L (ref 3.5–5.1)
Sodium: 138 mEq/L (ref 135–145)
TCO2: 27 mmol/L (ref 0–100)

## 2013-05-31 LAB — TROPONIN I: Troponin I: <0.3 ng/mL (ref <0.30)

## 2013-05-31 LAB — URINE MICROSCOPIC-ADD ON

## 2013-05-31 LAB — GLUCOSE, CAPILLARY
Glucose-Capillary: 373 mg/dL — ABNORMAL HIGH (ref 70–99)
Glucose-Capillary: 321 mg/dL — ABNORMAL HIGH (ref 70–99)
Glucose-Capillary: 197 mg/dL — ABNORMAL HIGH (ref 70–99)

## 2013-05-31 LAB — POCT I-STAT TROPONIN I: Troponin i, poc: 0 ng/mL (ref 0.00–0.08)

## 2013-05-31 MED ORDER — INSULIN ASPART 100 UNIT/ML ~~LOC~~ SOLN
10.0000 [IU] | Freq: Once | SUBCUTANEOUS | Status: AC
  Administered 2013-05-31: 10 [IU] via INTRAVENOUS
  Filled 2013-05-31: qty 1

## 2013-05-31 MED ORDER — METOPROLOL TARTRATE 25 MG PO TABS
100.0000 mg | ORAL_TABLET | Freq: Once | ORAL | Status: AC
  Administered 2013-05-31: 100 mg via ORAL
  Filled 2013-05-31: qty 4

## 2013-05-31 MED ORDER — HYDROCHLOROTHIAZIDE 12.5 MG PO CAPS
12.5000 mg | ORAL_CAPSULE | Freq: Once | ORAL | Status: AC
  Administered 2013-05-31: 12.5 mg via ORAL
  Filled 2013-05-31: qty 1

## 2013-05-31 MED ORDER — ACETAMINOPHEN 325 MG PO TABS
650.0000 mg | ORAL_TABLET | Freq: Once | ORAL | Status: AC
  Administered 2013-05-31: 650 mg via ORAL
  Filled 2013-05-31: qty 2

## 2013-05-31 MED ORDER — SODIUM CHLORIDE 0.9 % IV BOLUS (SEPSIS)
500.0000 mL | Freq: Once | INTRAVENOUS | Status: AC
  Administered 2013-05-31: 500 mL via INTRAVENOUS

## 2013-05-31 MED ORDER — COLCHICINE 0.6 MG PO TABS: 0.6000 mg | ORAL_TABLET | Freq: Every day | ORAL | Status: DC | PRN

## 2013-05-31 MED ORDER — LOVASTATIN 20 MG PO TABS: 20.0000 mg | ORAL_TABLET | Freq: Every day | ORAL | Status: DC

## 2013-05-31 MED ORDER — FELODIPINE ER 10 MG PO TB24
10.0000 mg | ORAL_TABLET | Freq: Every day | ORAL | Status: DC
  Filled 2013-05-31 (×2): qty 1

## 2013-05-31 MED ORDER — LOSARTAN POTASSIUM-HCTZ 50-12.5 MG PO TABS: 1.0000 | ORAL_TABLET | Freq: Every day | ORAL | Status: DC

## 2013-05-31 MED ORDER — METFORMIN HCL 500 MG PO TABS: 500.0000 mg | ORAL_TABLET | Freq: Two times a day (BID) | ORAL | Status: DC

## 2013-05-31 MED ORDER — GLUCOSE BLOOD VI STRP: ORAL_STRIP | Status: DC

## 2013-05-31 MED ORDER — METOPROLOL SUCCINATE ER 100 MG PO TB24: 100.0000 mg | ORAL_TABLET | Freq: Every day | ORAL | Status: DC

## 2013-05-31 MED ORDER — ASPIRIN EC 81 MG PO TBEC: 81.0000 mg | DELAYED_RELEASE_TABLET | Freq: Every day | ORAL | Status: DC

## 2013-05-31 MED ORDER — GLIPIZIDE 10 MG PO TABS: 10.0000 mg | ORAL_TABLET | Freq: Two times a day (BID) | ORAL | Status: DC

## 2013-05-31 MED ORDER — HYDRALAZINE HCL 20 MG/ML IJ SOLN
10.0000 mg | Freq: Once | INTRAMUSCULAR | Status: AC
  Administered 2013-05-31: 10 mg via INTRAVENOUS
  Filled 2013-05-31: qty 1

## 2013-05-31 MED ORDER — SODIUM CHLORIDE 0.9 % IV SOLN
100.0000 mL/h | INTRAVENOUS | Status: DC
  Administered 2013-05-31: 100 mL/h via INTRAVENOUS

## 2013-05-31 MED ORDER — FELODIPINE ER 10 MG PO TB24: 10.0000 mg | ORAL_TABLET | Freq: Every day | ORAL | Status: DC

## 2013-05-31 MED ORDER — SODIUM CHLORIDE 0.9 % IV SOLN: Freq: Once | INTRAVENOUS | Status: DC

## 2013-05-31 MED ORDER — LOSARTAN POTASSIUM 50 MG PO TABS
50.0000 mg | ORAL_TABLET | Freq: Once | ORAL | Status: AC
  Administered 2013-05-31: 50 mg via ORAL
  Filled 2013-05-31: qty 1

## 2013-05-31 NOTE — ED Notes (Signed)
Pt reports HA has decreased. PA states pt okay to leave.

## 2013-05-31 NOTE — ED Provider Notes (Signed)
CSN: [id number]     Arrival date & time 05/31/13  0133 History   First MD Initiated Contact with Patient 05/31/13 0143     Chief Complaint  Patient presents with  . Hypertension   (Consider location/radiation/quality/duration/timing/severity/associated sxs/prior Treatment) HPI Comments: The patient is a 67 year-old male with a past medical history of DM, HTN, OSA, presenting the Emergency Department with a chief complaint of high blood pressure.  The patient states that he ran out of all of his medication for more than one week.  He complains of "equilibrium problems" starting this week and worsening today.  He describes this as having to steady himself after laying or sitting down.  He denies syncope or fall due to the equilibrium issues.  Reports he was concerned because today his symptoms worsened.  The patient reports he has been unable to refill his medication due to insurance reasons.   The history is provided by the patient.    Past Medical History  Diagnosis Date  . Diabetes mellitus without complication   . Hypertension   . High cholesterol    History reviewed. No pertinent past surgical history. No family history on file. History  Substance Use Topics  . Smoking status: Former Games developer  . Smokeless tobacco: Not on file  . Alcohol Use: No    Review of Systems  All other systems reviewed and are negative.    Allergies  Lisinopril and Penicillins  Home Medications   Current Outpatient Rx  Name  Route  Sig  Dispense  Refill  . aspirin EC 81 MG tablet   Oral   Take 1 tablet (81 mg total) by mouth daily.   30 tablet   0   . colchicine 0.6 MG tablet   Oral   Take 1-2 tablets (0.6-1.2 mg total) by mouth daily as needed (for gout flare). 2 po at onset of flair and then repeat in 1 h for acute gout attack then 1 po qd until pain free   30 tablet   0   . felodipine (PLENDIL) 10 MG 24 hr tablet   Oral   Take 1 tablet (10 mg total) by mouth daily. NEED VISIT/LABS!   30 tablet   0   . glipiZIDE (GLUCOTROL) 10 MG tablet   Oral   Take 1 tablet (10 mg total) by mouth 2 (two) times daily before a meal.   90 tablet   1   . glucose blood test strip      Use as directed   100 each   3   . losartan-hydrochlorothiazide (HYZAAR) 50-12.5 MG per tablet   Oral   Take 1 tablet by mouth daily.   30 tablet   0   . lovastatin (MEVACOR) 20 MG tablet   Oral   Take 1 tablet (20 mg total) by mouth at bedtime.   30 tablet   0   . metFORMIN (GLUCOPHAGE) 500 MG tablet   Oral   Take 1 tablet (500 mg total) by mouth 2 (two) times daily with a meal.   60 tablet   0   . metoprolol succinate (TOPROL-XL) 100 MG 24 hr tablet   Oral   Take 1 tablet (100 mg total) by mouth daily. NEED VISIT, LABS!   30 tablet   0    BP 181/84  Pulse 59  Temp(Src) 98 F (36.7 C) (Oral)  Resp 20  Ht 5\' 3"  (1.6 m)  Wt 193 lb 4.8 oz (87.68 kg)  BMI  34.25 kg/m2  SpO2 100% Physical Exam  Nursing note and vitals reviewed. Constitutional: He appears well-developed and well-nourished. No distress.  HENT:  Head: Normocephalic and atraumatic.  Neck: Neck supple.  Cardiovascular: Normal rate and regular rhythm.   Murmur heard. Pulmonary/Chest: Effort normal and breath sounds normal. He has no wheezes. He has no rales.  Abdominal: Soft. Bowel sounds are normal. He exhibits no distension. There is no tenderness. There is no rebound and no guarding.  Musculoskeletal:  Trace pitting edema bilaterally.  Neurological: He is alert. No cranial nerve deficit. Coordination and gait normal.  Patient able to ambulate in room without assistance. Able to walk on the balls of his feet without any sings of ataxia or coordination issues.    ED Course  Procedures (including critical care time) Labs Review Labs Reviewed  COMPREHENSIVE METABOLIC PANEL - Abnormal; Notable for the following:    Glucose, Bld 364 (*)    Total Bilirubin 0.2 (*)    GFR calc non Af Amer 57 (*)    GFR calc Af  Amer 66 (*)    All other components within normal limits  URINALYSIS, ROUTINE W REFLEX MICROSCOPIC - Abnormal; Notable for the following:    Glucose, UA >1000 (*)    Hgb urine dipstick SMALL (*)    Protein, ur 100 (*)    All other components within normal limits  CBC WITH DIFFERENTIAL - Abnormal; Notable for the following:    Neutrophils Relative % 25 (*)    Neutro Abs 1.2 (*)    Lymphocytes Relative 66 (*)    All other components within normal limits  GLUCOSE, CAPILLARY - Abnormal; Notable for the following:    Glucose-Capillary 373 (*)    All other components within normal limits  GLUCOSE, CAPILLARY - Abnormal; Notable for the following:    Glucose-Capillary 325 (*)    All other components within normal limits  GLUCOSE, CAPILLARY - Abnormal; Notable for the following:    Glucose-Capillary 321 (*)    All other components within normal limits  GLUCOSE, CAPILLARY - Abnormal; Notable for the following:    Glucose-Capillary 197 (*)    All other components within normal limits  POCT I-STAT, CHEM 8 - Abnormal; Notable for the following:    Creatinine, Ser 1.60 (*)    Glucose, Bld 357 (*)    All other components within normal limits  ETHANOL  PROTIME-INR  APTT  TROPONIN I  URINE RAPID DRUG SCREEN (HOSP PERFORMED)  URINE MICROSCOPIC-ADD ON  POCT I-STAT TROPONIN I   Imaging Review Ct Head Wo Contrast  05/31/2013   CLINICAL DATA:  Elevated blood pressure. Dizziness.  EXAM: CT HEAD WITHOUT CONTRAST  TECHNIQUE: Contiguous axial images were obtained from the base of the skull through the vertex without intravenous contrast.  COMPARISON:  No priors.  FINDINGS: Mild cerebral atrophy. No acute intracranial abnormalities. Specifically, no evidence of acute intracranial hemorrhage, no definite findings of acute/subacute cerebral ischemia, no mass, mass effect, hydrocephalus or abnormal intra or extra-axial fluid collections. Visualized paranasal sinuses and mastoids are well pneumatized. No  acute displaced skull fractures are identified.  IMPRESSION: 1. No acute intracranial abnormalities. 2. Mild cerebral atrophy.   Electronically Signed   By: Trudie Reed M.D.   On: 05/31/2013 02:47    EKG Interpretation     Ventricular Rate:  58 PR Interval:  147 QRS Duration: 99 QT Interval:  413 QTC Calculation: 406 R Axis:   -30 Text Interpretation:  Age not entered, assumed to  be  67 years old for purpose of ECG interpretation Sinus rhythm Left axis deviation RSR' in V1 or V2, probably normal variant Borderline T abnormalities, inferior leads No old tracing to compare            MDM   1. Hypertension   2. Hyperglycemia   3. Type II or unspecified type diabetes mellitus without mention of complication, not stated as uncontrolled   4. DM (diabetes mellitus), type 2, uncontrolled    Patient with a history of non-compliance presents with a greater than one week history of no HTN and DM medication.  His BP in the ED is 238/99.  Will try and reduce by 20% -25% with patients home oral medications.   TIA-protocol has been initiated.  Due to neuro symptoms.  No focal neuro deficits on exam.  Patient is able to ambulate without assistance.  Last BP 181/88.  Discussed lab result with patient and treatment plan.  He reports understanding and agrees with the treatment plan.  He complains of a headache last BP 175/71, will give tylenol and continue to monitor patients BP to make sure he is not at risk for underperfusion.   Patient headache improved and he is stable for discharge. Reordered all home medications.  Meds given in ED:  Medications  metoprolol tartrate (LOPRESSOR) tablet 100 mg (100 mg Oral Given 05/31/13 0226)  losartan (COZAAR) tablet 50 mg (50 mg Oral Given 05/31/13 0234)  hydrochlorothiazide (MICROZIDE) capsule 12.5 mg (12.5 mg Oral Given 05/31/13 0226)  sodium chloride 0.9 % bolus 500 mL (0 mLs Intravenous Stopped 05/31/13 0320)  insulin aspart (novoLOG) injection  10 Units (10 Units Intravenous Given 05/31/13 0329)  hydrALAZINE (APRESOLINE) injection 10 mg (10 mg Intravenous Given 05/31/13 0353)  acetaminophen (TYLENOL) tablet 650 mg (650 mg Oral Given 05/31/13 0457)    Discharge Medication List as of 05/31/2013  4:46 AM        Leotis Shames Doretha Imus, PA-C 05/31/13 2250

## 2013-05-31 NOTE — ED Provider Notes (Signed)
Medical screening examination/treatment/procedure(s) were conducted as a shared visit with non-physician practitioner(s) and myself.  I personally evaluated the patient during the encounter  Please see my separate respective documentation pertaining to this patient encounter   Vida Roller, MD 05/31/13 2304

## 2013-05-31 NOTE — ED Provider Notes (Signed)
Pt is a 67 year old male with a history of hypertension and diabetes who has been off of his medications for approximately 2 weeks. He states that yesterday he developed intermittent disequilibrium which became worse tonight and he felt dizzy in bed. This is a vague description of his dizziness but he denies vertigo or near syncope. He has no visual changes, no chest pain shortness of breath and no nausea or vomiting. He took his blood pressure and noted that it was severely elevated thus he presents to the hospital for evaluation. He does note that his dizziness gets worse when he stands, he needs to steady himself before he starts to walk which is very abnormal for him.  On exam the patient has a moderate systolic murmur, normal pulses, no carotid bruits and a normal neurologic exam in the supine position. Please see physician assistant's note for gait findings.  Neurologic exam:  Speech clear, pupils equal round reactive to light, extraocular movements intact Normal peripheral visual fields Cranial nerves III through XII normal including no facial droop Follows commands, moves all extremities x4, normal strength to bilateral upper and lower extremities at all major muscle groups including grip Sensation normal to light touch and pinprick Coordination intact, no limb ataxia, finger-nose-finger normal Rapid alternating movements normal No pronator drift Gait normal  The patient will have a stroke workup, antihypertensives to treat his blood pressure an attempt to bring it down approximately 20-25%. He does not have any focal neurologic deficits on my exam that would suggest activating a code stroke.  Last blood pressure check showed 180/88, the patient has no symptoms at rest, he has ambulated twice in the hallway without assistance without any ataxia or disequilibrium. I have discussed the results of the patient's labs with him and CT scan, he has expressed his understanding to the indications for  return. I have offered him admission to the hospital for observation overnight but he has refused and states he would rather followup with his family doctor, will get his medications filled today.  Medical screening examination/treatment/procedure(s) were conducted as a shared visit with non-physician practitioner(s) and myself.  I personally evaluated the patient during the encounter.  Clinical Impression: Hypertension, hyperglycemia      Vida Roller, MD 05/31/13 [phone]

## 2013-05-31 NOTE — ED Notes (Signed)
PA Lauren in to see patient

## 2013-05-31 NOTE — ED Notes (Signed)
Pt. reports elevated blood pressure this evening at home - 208/110 , pt. stated he has not taken his antihypertensive medications for several days , respirations unlabored , slight dizziness .

## 2013-05-31 NOTE — ED Notes (Signed)
Pt complaining of HA. PA notified and told patient he can stay in bed and rest for a while prior to discharge to monitor HA.

## 2013-05-31 NOTE — ED Notes (Signed)
Dr Miller at bedside. 

## 2013-06-25 ENCOUNTER — Encounter: Payer: 59 | Admitting: Family Medicine

## 2013-07-02 ENCOUNTER — Ambulatory Visit (INDEPENDENT_AMBULATORY_CARE_PROVIDER_SITE_OTHER): Payer: Medicare Other | Admitting: Family Medicine

## 2013-07-02 ENCOUNTER — Ambulatory Visit: Payer: Medicare Other

## 2013-07-02 ENCOUNTER — Encounter: Payer: Self-pay | Admitting: Family Medicine

## 2013-07-02 VITALS — BP 150/90 | HR 73 | Temp 97.5°F | Resp 16 | Ht 62.5 in | Wt 198.4 lb

## 2013-07-02 DIAGNOSIS — E785 Hyperlipidemia, unspecified: Secondary | ICD-10-CM | POA: Diagnosis not present

## 2013-07-02 DIAGNOSIS — H6121 Impacted cerumen, right ear: Secondary | ICD-10-CM

## 2013-07-02 DIAGNOSIS — E1165 Type 2 diabetes mellitus with hyperglycemia: Secondary | ICD-10-CM

## 2013-07-02 DIAGNOSIS — R05 Cough: Secondary | ICD-10-CM | POA: Diagnosis not present

## 2013-07-02 DIAGNOSIS — M549 Dorsalgia, unspecified: Secondary | ICD-10-CM

## 2013-07-02 DIAGNOSIS — R059 Cough, unspecified: Secondary | ICD-10-CM

## 2013-07-02 DIAGNOSIS — I10 Essential (primary) hypertension: Secondary | ICD-10-CM | POA: Diagnosis not present

## 2013-07-02 DIAGNOSIS — IMO0001 Reserved for inherently not codable concepts without codable children: Secondary | ICD-10-CM

## 2013-07-02 DIAGNOSIS — H6120 Impacted cerumen, unspecified ear: Secondary | ICD-10-CM

## 2013-07-02 DIAGNOSIS — IMO0002 Reserved for concepts with insufficient information to code with codable children: Secondary | ICD-10-CM

## 2013-07-02 LAB — GLUCOSE, POCT (MANUAL RESULT ENTRY): POC Glucose: 208 mg/dl — AB (ref 70–99)

## 2013-07-02 LAB — COMPREHENSIVE METABOLIC PANEL
ALT: 30 U/L (ref 0–53)
AST: 19 U/L (ref 0–37)
Albumin: 4.7 g/dL (ref 3.5–5.2)
Alkaline Phosphatase: 47 U/L (ref 39–117)
Chloride: 102 mEq/L (ref 96–112)
Potassium: 4.2 mEq/L (ref 3.5–5.3)
Sodium: 141 mEq/L (ref 135–145)
Total Bilirubin: 0.6 mg/dL (ref 0.3–1.2)
Total Protein: 7.1 g/dL (ref 6.0–8.3)

## 2013-07-02 LAB — LIPID PANEL
LDL Cholesterol: 84 mg/dL (ref 0–99)
Triglycerides: 126 mg/dL (ref <150)
VLDL: 25 mg/dL (ref 0–40)

## 2013-07-02 MED ORDER — METFORMIN HCL 500 MG PO TABS: 500.0000 mg | ORAL_TABLET | Freq: Two times a day (BID) | ORAL | Status: DC

## 2013-07-02 MED ORDER — LOSARTAN POTASSIUM-HCTZ 50-12.5 MG PO TABS: 1.0000 | ORAL_TABLET | Freq: Every day | ORAL | Status: DC

## 2013-07-02 MED ORDER — LOVASTATIN 20 MG PO TABS: 20.0000 mg | ORAL_TABLET | Freq: Every day | ORAL | Status: DC

## 2013-07-02 MED ORDER — FELODIPINE ER 10 MG PO TB24: 10.0000 mg | ORAL_TABLET | Freq: Every day | ORAL | Status: DC

## 2013-07-02 MED ORDER — METOPROLOL SUCCINATE ER 100 MG PO TB24: 100.0000 mg | ORAL_TABLET | Freq: Every day | ORAL | Status: DC

## 2013-07-02 MED ORDER — GLIPIZIDE 10 MG PO TABS: 10.0000 mg | ORAL_TABLET | Freq: Two times a day (BID) | ORAL | Status: DC

## 2013-07-02 NOTE — Progress Notes (Signed)
   Subjective:    Patient ID: Antonio Wells, male    DOB: 1945/09/02, 67 y.o.   MRN: [id number]  HPI    Review of Systems  Constitutional: Negative.   HENT: Positive for ear pain.        Right since 07/01/2013  Eyes: Negative.   Respiratory: Positive for cough.   Cardiovascular: Negative.   Gastrointestinal: Negative.   Endocrine: Negative.   Genitourinary: Negative.   Musculoskeletal: Positive for arthralgias and back pain.  Skin: Negative.   Allergic/Immunologic: Negative.   Neurological: Negative.   Hematological: Negative.   Psychiatric/Behavioral: Negative.        Objective:   Physical Exam        Assessment & Plan:

## 2013-07-02 NOTE — Patient Instructions (Addendum)
Your blood sugar test over 3 months is uncontrolled as off meds, but recent home readings better. Continue same medicine for now and recheck in next 6 weeks with home readings. Keep a record of your blood pressures outside of the office and bring them to the next office visit.  I did not see a pneumonia on your chest x ray, but if the soreness in your back worsens, not improved in next few days, or any shortness of breath or chest pain - return here or emergency room to discuss. You should receive a call or letter about your lab results within the next week to 10 days.  If any increase in ear pain - return for recheck.  Return to the clinic or go to the nearest emergency room if any of your symptoms worsen or new symptoms occur. Will plan on physical at future office visit.

## 2013-07-02 NOTE — Progress Notes (Signed)
Subjective:    Patient ID: Antonio Wells, male    DOB: [birth year], 67 y.o.   MRN: [id number]  HPI Antonio Wells is a 67 y.o. male Scheduled for physical and multiple other concerns today, but last ov with me 07/03/2012. Advised to follow up with me in few weeks then for arthralgias, and 3 months for diabetes. Additionally here after hospital eval 05/31/13 for dizziness/disequilibrium. Reason for not follow up - off insurance for awhile.   DM2 - uncontrolled at last OV about a year ago, with A1c 7.9.  Increased metformin to 1000mg  Qam, 500mg  QHS. Did not increase then or recently, has been taking 500mg  twice a day.  Ran out of this only few weeks. When taking glipizide, took twice per day. Back on this and metformin since hospital eval last month. Had been off one of these prior - not sure which one.   recent home cbg's in am: 103, 173, 176, 169, 207, 179, 166, 179, 166, 179, 211, 180, 199.   HTN - controlled at CPE last year. Continued on same meds then. had been off medicine for a month or two at ER eval.  No missed doses since eval, but ran out yesterday.  No other medical providers except here. See below re: hospital eval in November.  Home BP's on meds past few weeks - 128/84-154/95. Usually 130-140's. NO recent chest pains. Denies recent lightheadedness/dizziness. No weakness in arms/legs/face, no slurred speech. No headaches. No new side effects with BP meds. Prior dry cough improved off lisinopril.   Hyperlipidemia - restrted mevacor recently as above, fasting labs drawn this am.   Ear pain - R ear feels clogged since yesterday am. Tried otc wax removal - no relief. No fever. No pain, just blocked. No new cough, no dyspnea. Has had some soreness in upper back today - concerned about pain in back today as hx of pneumonia years ago that occurred wen had back pain and little cough. No calf pain or swelling, no chest pain, not short of breath, no hx of blood clots. Did drive in car over  thanksgiving holiday - 6-7 hours, but no chest/calf pain and not dyspneic. Pain with deep breath in upper back only.   Seen 05/31/13 in ER - off meds for approx 2 weeks, dizziness/equilibrium problems day prior. With elevated BP (238/99 on initial eval).  treatment in ER with hydralazine injection and po lopressor, microzide, cozaar. discharge blood pressure: 180/88. Nonfocal neuro exam. CT head indicated mild cerebral atrophy, but no acute intracranial abnormalities. Admission to hospital offered for overnight observation, but this was refused with plan on follow up with primary provider. See above. No new neurologic sx's today.   Refuses flu shot.   Hospital labs from 05/2013: Results for orders placed during the hospital encounter of 05/31/13  ETHANOL      Result Value Range   Alcohol, Ethyl (B) <11  0 - 11 mg/dL  PROTIME-INR      Result Value Range   Prothrombin Time 12.3  11.6 - 15.2 seconds   INR 0.93  0.00 - 1.49  APTT      Result Value Range   aPTT 31  24 - 37 seconds  COMPREHENSIVE METABOLIC PANEL      Result Value Range   Sodium 137  135 - 145 mEq/L   Potassium 4.2  3.5 - 5.1 mEq/L   Chloride 100  96 - 112 mEq/L   CO2 23  19 - 32 mEq/L  Glucose, Bld 364 (*) 70 - 99 mg/dL   BUN 18  6 - 23 mg/dL   Creatinine, Ser 5.40  0.50 - 1.35 mg/dL   Calcium 9.1  8.4 - 98.1 mg/dL   Total Protein 7.0  6.0 - 8.3 g/dL   Albumin 4.1  3.5 - 5.2 g/dL   AST 26  0 - 37 U/L   ALT 32  0 - 53 U/L   Alkaline Phosphatase 57  39 - 117 U/L   Total Bilirubin 0.2 (*) 0.3 - 1.2 mg/dL   GFR calc non Af Amer 57 (*) >90 mL/min   GFR calc Af Amer 66 (*) >90 mL/min  TROPONIN I      Result Value Range   Troponin I <0.30  <0.30 ng/mL  URINE RAPID DRUG SCREEN (HOSP PERFORMED)      Result Value Range   Opiates NONE DETECTED  NONE DETECTED   Cocaine NONE DETECTED  NONE DETECTED   Benzodiazepines NONE DETECTED  NONE DETECTED   Amphetamines NONE DETECTED  NONE DETECTED   Tetrahydrocannabinol NONE DETECTED   NONE DETECTED   Barbiturates NONE DETECTED  NONE DETECTED  URINALYSIS, ROUTINE W REFLEX MICROSCOPIC      Result Value Range   Color, Urine YELLOW  YELLOW   APPearance CLEAR  CLEAR   Specific Gravity, Urine 1.026  1.005 - 1.030   pH 5.5  5.0 - 8.0   Glucose, UA >1000 (*) NEGATIVE mg/dL   Hgb urine dipstick SMALL (*) NEGATIVE   Bilirubin Urine NEGATIVE  NEGATIVE   Ketones, ur NEGATIVE  NEGATIVE mg/dL   Protein, ur 191 (*) NEGATIVE mg/dL   Urobilinogen, UA 0.2  0.0 - 1.0 mg/dL   Nitrite NEGATIVE  NEGATIVE   Leukocytes, UA NEGATIVE  NEGATIVE  CBC WITH DIFFERENTIAL      Result Value Range   WBC 4.7  4.0 - 10.5 K/uL   RBC 4.61  4.22 - 5.81 MIL/uL   Hemoglobin 14.9  13.0 - 17.0 g/dL   HCT 47.8  29.5 - 62.1 %   MCV 90.2  78.0 - 100.0 fL   MCH 32.3  26.0 - 34.0 pg   MCHC 35.8  30.0 - 36.0 g/dL   RDW 30.8  65.7 - 84.6 %   Platelets 199  150 - 400 K/uL   Neutrophils Relative % 25 (*) 43 - 77 %   Neutro Abs 1.2 (*) 1.7 - 7.7 K/uL   Lymphocytes Relative 66 (*) 12 - 46 %   Lymphs Abs 3.1  0.7 - 4.0 K/uL   Monocytes Relative 8  3 - 12 %   Monocytes Absolute 0.4  0.1 - 1.0 K/uL   Eosinophils Relative 1  0 - 5 %   Eosinophils Absolute 0.1  0.0 - 0.7 K/uL   Basophils Relative 0  0 - 1 %   Basophils Absolute 0.0  0.0 - 0.1 K/uL  GLUCOSE, CAPILLARY      Result Value Range   Glucose-Capillary 373 (*) 70 - 99 mg/dL  GLUCOSE, CAPILLARY      Result Value Range   Glucose-Capillary 325 (*) 70 - 99 mg/dL  URINE MICROSCOPIC-ADD ON      Result Value Range   WBC, UA 0-2  <3 WBC/hpf   RBC / HPF 3-6  <3 RBC/hpf   Bacteria, UA RARE  RARE  GLUCOSE, CAPILLARY      Result Value Range   Glucose-Capillary 321 (*) 70 - 99 mg/dL  GLUCOSE, CAPILLARY  Result Value Range   Glucose-Capillary 197 (*) 70 - 99 mg/dL  POCT I-STAT, CHEM 8      Result Value Range   Sodium 138  135 - 145 mEq/L   Potassium 3.5  3.5 - 5.1 mEq/L   Chloride 101  96 - 112 mEq/L   BUN 19  6 - 23 mg/dL   Creatinine, Ser 1.61  (*) 0.50 - 1.35 mg/dL   Glucose, Bld 096 (*) 70 - 99 mg/dL   Calcium, Ion 0.45  4.09 - 1.30 mmol/L   TCO2 27  0 - 100 mmol/L   Hemoglobin 15.3  13.0 - 17.0 g/dL   HCT 81.1  91.4 - 78.2 %  POCT I-STAT TROPONIN I      Result Value Range   Troponin i, poc 0.00  0.00 - 0.08 ng/mL   Comment 3              Patient Active Problem List   Diagnosis Date Noted  . Gout 08/12/2011  . Hypertension 08/12/2011  . High cholesterol 08/12/2011  . OSA (obstructive sleep apnea) 08/12/2011  . Diabetes type 2, uncontrolled 08/12/2011   Past Medical History  Diagnosis Date  . Diabetes mellitus without complication   . Hypertension   . High cholesterol    No past surgical history on file. Allergies  Allergen Reactions  . Lisinopril Cough  . Penicillins Other (See Comments)    Passed out   Prior to Admission medications   Medication Sig Start Date End Date Taking? Authorizing Provider  aspirin EC 81 MG tablet Take 1 tablet (81 mg total) by mouth daily. 05/31/13  Yes Lauren Doretha Imus, PA-C  colchicine 0.6 MG tablet Take 1-2 tablets (0.6-1.2 mg total) by mouth daily as needed (for gout flare). 2 po at onset of flair and then repeat in 1 h for acute gout attack then 1 po qd until pain free 05/31/13  Yes Lauren Doretha Imus, PA-C  felodipine (PLENDIL) 10 MG 24 hr tablet Take 1 tablet (10 mg total) by mouth daily. NEED VISIT/LABS! 05/31/13  Yes Lauren Doretha Imus, PA-C  glipiZIDE (GLUCOTROL) 10 MG tablet Take 1 tablet (10 mg total) by mouth 2 (two) times daily before a meal. 05/31/13  Yes Lauren Doretha Imus, PA-C  glucose blood test strip Use as directed 05/31/13  Yes Lauren Doretha Imus, PA-C  losartan-hydrochlorothiazide (HYZAAR) 50-12.5 MG per tablet Take 1 tablet by mouth daily. 05/31/13  Yes Lauren Doretha Imus, PA-C  lovastatin (MEVACOR) 20 MG tablet Take 1 tablet (20 mg total) by mouth at bedtime. 05/31/13  Yes Lauren Doretha Imus, PA-C  metFORMIN (GLUCOPHAGE) 500 MG tablet Take 1 tablet (500 mg total) by mouth 2 (two) times  daily with a meal. 05/31/13  Yes Lauren Doretha Imus, PA-C  metoprolol succinate (TOPROL-XL) 100 MG 24 hr tablet Take 1 tablet (100 mg total) by mouth daily. NEED VISIT, LABS! 05/31/13  Yes Clabe Seal, PA-C   History   Social History  . Marital Status: Single    Spouse Name: N/A    Number of Children: N/A  . Years of Education: N/A   Occupational History  . Musician    Social History Main Topics  . Smoking status: Former Games developer  . Smokeless tobacco: Not on file  . Alcohol Use: No  . Drug Use: No  . Sexual Activity: Yes   Other Topics Concern  . Not on file   Social History Narrative   Married. Education: college.  Review of Systems  Constitutional: Negative for fatigue and unexpected weight change.  Eyes: Negative for visual disturbance.  Respiratory: Negative for cough, chest tightness and shortness of breath.   Cardiovascular: Negative for chest pain, palpitations and leg swelling.  Gastrointestinal: Negative for abdominal pain and blood in stool.  Neurological: Negative for dizziness, light-headedness and headaches.    13 point review of systems per patient health survey noted.  Negative other than as indicated on reviewed nursing note or above.      Objective:   Physical Exam  Vitals reviewed. Constitutional: He is oriented to person, place, and time. He appears well-developed and well-nourished.  HENT:  Head: Normocephalic and atraumatic.  R canal occluded - dark yellow cerumen - curette used to remove some of cerumen in external canal without complication. No abrasions noted. Still cerumen close to tm - lavage performed by staff.   Eyes: Pupils are equal, round, and reactive to light.  Cardiovascular: Normal rate, regular rhythm, normal heart sounds and intact distal pulses.   Pulmonary/Chest: Effort normal and breath sounds normal.  Abdominal: Soft. There is no tenderness.  Neurological: He is alert and oriented to person, place, and time.    Microfilament testing of feet normal bilaterally.  Skin: Skin is warm, dry and intact. No rash noted.  Psychiatric: He has a normal mood and affect. His behavior is normal.  noted cerumen in L canal as well, but not completely obstructive.  [facility] reading (PRIMARY) by  Dr. Neva Seat: CXR: few RLL>LLL markings. No discrete infiltrate.   Results for orders placed in visit on 07/02/13  GLUCOSE, POCT (MANUAL RESULT ENTRY)      Result Value Range   POC Glucose 208 (*) 70 - 99 mg/dl  POCT GLYCOSYLATED HEMOGLOBIN (HGB A1C)      Result Value Range   Hemoglobin A1C 10.7     4:24 PM Repeat ear exam after lavage - still some wax bilaterally. Will continue lavage attempt.   After lavage by Eula Listen, PA-C, resolution of cerumen.      Assessment & Plan:   Antonio Wells is a 67 y.o. male CPE deferred for later date d/t other acute concerns followed up today.   Type II or unspecified type diabetes mellitus without mention of complication, uncontrolled - Plan: [facility] Diabetes Foot Exam, POCT glucose (manual entry), POCT glycosylated hemoglobin (Hb A1C)  -uncontrolled due to prior med nonadherence. Recent home readings better. Will restart metformin and glipizide. Will try metformin at 500mg  BID, bu may need increase as discussed at last CPE a year ago. Return with home readings next 6 weeks.   Essential hypertension, benign - Plan: Comprehensive metabolic panel, metoprolol succinate (TOPROL-XL) 100 MG 24 hr tablet, losartan-hydrochlorothiazide (HYZAAR) 50-12.5 MG per tablet, felodipine (PLENDIL) 10 MG 24 hr tablet.   -prior med nonadherence, and off med today, but home readings on meds overall ok.  Restart prior regimen and recheck in next 6 weeks.   Other and unspecified hyperlipidemia - Plan: Lipid panel, lovastatin (MEVACOR) 20 MG tablet refilled. Lab pending  Upper back pain - not reproducible on exam, but location possible MSk/rhomboid source. No hx of dvt, no chest pain, no dyspnea and dry, rare  cough - unchanged. Reassurance at present, but XR to overread and ER/rtc precautions discussed as below.   Cough - Plan: DG Chest 2 View - improved of Ace -I. XR to overread. former smoker and FH of lung CA in brother  Cerumen impaction, right but noted cerumen in left  as well, removed partly by myselfm then CMA and Eula Listen, PA-C. Improved. rtc precautions.   Meds ordered this encounter  Medications  . metoprolol succinate (TOPROL-XL) 100 MG 24 hr tablet    Sig: Take 1 tablet (100 mg total) by mouth daily.    Dispense:  90 tablet    Refill:  1  . lovastatin (MEVACOR) 20 MG tablet    Sig: Take 1 tablet (20 mg total) by mouth at bedtime.    Dispense:  90 tablet    Refill:  1  . losartan-hydrochlorothiazide (HYZAAR) 50-12.5 MG per tablet    Sig: Take 1 tablet by mouth daily.    Dispense:  90 tablet    Refill:  1  . glipiZIDE (GLUCOTROL) 10 MG tablet    Sig: Take 1 tablet (10 mg total) by mouth 2 (two) times daily before a meal.    Dispense:  90 tablet    Refill:  1  . felodipine (PLENDIL) 10 MG 24 hr tablet    Sig: Take 1 tablet (10 mg total) by mouth daily.    Dispense:  90 tablet    Refill:  1  . metFORMIN (GLUCOPHAGE) 500 MG tablet    Sig: Take 1 tablet (500 mg total) by mouth 2 (two) times daily with a meal.    Dispense:  180 tablet    Refill:  1   Patient Instructions  Your blood sugar test over 3 months is uncontrolled as off meds, but recent home readings better. Continue same medicine for now and recheck in next 6 weeks with home readings. Keep a record of your blood pressures outside of the office and bring them to the next office visit.  I did not see a pneumonia on your chest x ray, but if the soreness in your back worsens, not improved in next few days, or any shortness of breath or chest pain - return here or emergency room to discuss. You should receive a call or letter about your lab results within the next week to 10 days.  If any increase in ear pain - return for  recheck.  Return to the clinic or go to the nearest emergency room if any of your symptoms worsen or new symptoms occur. Will plan on physical at future office visit.

## 2013-07-27 ENCOUNTER — Telehealth: Payer: Self-pay | Admitting: Radiology

## 2013-07-27 ENCOUNTER — Telehealth: Payer: Self-pay

## 2013-07-27 DIAGNOSIS — I10 Essential (primary) hypertension: Secondary | ICD-10-CM

## 2013-07-27 DIAGNOSIS — E1165 Type 2 diabetes mellitus with hyperglycemia: Secondary | ICD-10-CM

## 2013-07-27 DIAGNOSIS — E785 Hyperlipidemia, unspecified: Secondary | ICD-10-CM

## 2013-07-27 DIAGNOSIS — IMO0002 Reserved for concepts with insufficient information to code with codable children: Secondary | ICD-10-CM

## 2013-07-27 MED ORDER — FELODIPINE ER 10 MG PO TB24: 10.0000 mg | ORAL_TABLET | Freq: Every day | ORAL | Status: DC

## 2013-07-27 MED ORDER — LOSARTAN POTASSIUM-HCTZ 50-12.5 MG PO TABS: 1.0000 | ORAL_TABLET | Freq: Every day | ORAL | Status: DC

## 2013-07-27 MED ORDER — METOPROLOL SUCCINATE ER 100 MG PO TB24: 100.0000 mg | ORAL_TABLET | Freq: Every day | ORAL | Status: DC

## 2013-07-27 MED ORDER — METFORMIN HCL 500 MG PO TABS
500.0000 mg | ORAL_TABLET | Freq: Two times a day (BID) | ORAL | Status: DC
Start: 2013-07-27 — End: 2014-01-28

## 2013-07-27 MED ORDER — LOVASTATIN 20 MG PO TABS: 20.0000 mg | ORAL_TABLET | Freq: Every day | ORAL | Status: DC

## 2013-07-27 MED ORDER — GLIPIZIDE 10 MG PO TABS: 10.0000 mg | ORAL_TABLET | Freq: Two times a day (BID) | ORAL | Status: DC

## 2013-07-27 NOTE — Telephone Encounter (Signed)
Patient wants the following medications transferred to  [facility] [address], [place], [place]  Fax [phone]  felodipine (PLENDIL) 10 MG 24 hr tablet losartan-hydrochlorothiazide (HYZAAR) 50-12.5 MG per tablet metoprolol succinate (TOPROL-XL) 100 MG 24 hr tablet metFORMIN (GLUCOPHAGE) 500 MG tablet glipiZIDE (GLUCOTROL) 10 MG tablet lovastatin (MEVACOR) 20 MG tablet

## 2013-07-27 NOTE — Telephone Encounter (Signed)
Called to verify. Sent to [facility] order. [phone]

## 2013-07-27 NOTE — Telephone Encounter (Signed)
Can we call to verify pharmacy - I do not see it is in the list for mail order?

## 2013-07-27 NOTE — Telephone Encounter (Signed)
Faxed meds to [facility] order.

## 2013-10-03 ENCOUNTER — Encounter: Payer: Self-pay | Admitting: Internal Medicine

## 2013-10-24 ENCOUNTER — Ambulatory Visit (INDEPENDENT_AMBULATORY_CARE_PROVIDER_SITE_OTHER): Payer: Medicare Other | Admitting: Family Medicine

## 2013-10-24 ENCOUNTER — Encounter: Payer: Self-pay | Admitting: Family Medicine

## 2013-10-24 VITALS — BP 146/82 | HR 80 | Temp 98.0°F | Resp 18

## 2013-10-24 DIAGNOSIS — IMO0001 Reserved for inherently not codable concepts without codable children: Secondary | ICD-10-CM | POA: Diagnosis not present

## 2013-10-24 DIAGNOSIS — I10 Essential (primary) hypertension: Secondary | ICD-10-CM | POA: Diagnosis not present

## 2013-10-24 DIAGNOSIS — R059 Cough, unspecified: Secondary | ICD-10-CM

## 2013-10-24 DIAGNOSIS — IMO0002 Reserved for concepts with insufficient information to code with codable children: Secondary | ICD-10-CM

## 2013-10-24 DIAGNOSIS — R05 Cough: Secondary | ICD-10-CM | POA: Diagnosis not present

## 2013-10-24 DIAGNOSIS — R079 Chest pain, unspecified: Secondary | ICD-10-CM

## 2013-10-24 DIAGNOSIS — J22 Unspecified acute lower respiratory infection: Secondary | ICD-10-CM

## 2013-10-24 DIAGNOSIS — E1165 Type 2 diabetes mellitus with hyperglycemia: Secondary | ICD-10-CM

## 2013-10-24 LAB — GLUCOSE, POCT (MANUAL RESULT ENTRY): POC Glucose: 105 mg/dl — AB (ref 70–99)

## 2013-10-24 LAB — POCT GLYCOSYLATED HEMOGLOBIN (HGB A1C): Hemoglobin A1C: 7.9

## 2013-10-24 MED ORDER — AZITHROMYCIN 250 MG PO TABS: ORAL_TABLET | ORAL | Status: DC

## 2013-10-24 MED ORDER — LOSARTAN POTASSIUM-HCTZ 100-12.5 MG PO TABS: 1.0000 | ORAL_TABLET | Freq: Every day | ORAL | Status: DC

## 2013-10-24 NOTE — Patient Instructions (Addendum)
For your cough - you likely had a virus and if continues to improve - can treat with Mucinex for cough, saline nasal spray for nasal congestion. If not improving in next 3-4 days - can fill antibiotic.   For blood pressure - will change to new dose of losartan (100/12/5mg  - one per day. Keep a record of your blood pressures outside of the office and bring them to the next office visit. Watch for low blood pressure symptoms/lightheadedness.   For diabetes - not quite at goal (A1C less than 7), but much better today. Continue same medicines, but really work on diet, and once evaluated by cardiology - exercise as instructed. Recheck these levels in 3 months and if still elevated, can adjust doses of meds at that time. We will call you about that appointment.   For your chest symptoms, although these sound stable - I will refer you to cardiologist for further evaluation and to discuss stress testing. Return to the clinic or go to the nearest emergency room/call 911 if any of your symptoms worsen or new symptoms occur.  Will likely need to be on a stronger dose of cholesterol medicine (Lipitor or Crestor) but can also discuss this with cardiology.

## 2013-10-24 NOTE — Progress Notes (Addendum)
Subjective:   This chart was scribed for Antonio Ray, MD by Antonio Wells, [facility] Scribe. This patient was seen in room 9 and the patient's care was started 5:21 PM.  Authored by Antonio Wells    Patient ID: Antonio Wells, male    DOB: 1945-08-24, 68 y.o.   MRN: [id number]  HPI  HPI Comments: Regular patient of mine. Last OV December 2014 with plan of recheck in 6 weeks for uncontrolled diabetes and elevated BP in office off medications.  Antonio Wells is a 68 y.o. Male with a PMHx of HTN, high cholesterol, gout, and DM type 2 who presents to [facility] complaining of a constant occasionally productive cough x 4-5 days. He reports associated wheezing and fever that has now resolved. He has tried OTC Tylenol, Nyquil, cough medication with anti-congestions with mild temporary improvement. At this time he denies any fever or chills.  Pt states he has been recording his BP readings regularly. He reports readings ranging from 131/89 to 154/96 while out of office. He admits to central chest "constrictions" during illness while coughing. Pt also reports this same discomfort during exertion. However, he states these pains resolve over time while exercising. Denies any CP while going up flights of stairs. Denies ever undergoing stress test studies in the past. No recent changes in these chest symptoms.   A1C 10.7 during last OV. Blood sugar reading roughly 2 months ago ranging from 149 to 198 out of office. No recent symptomatic lows.   At time of last visit pt was out of Metformin. States he is currently taking Metformin 500 mg, Losartan-HCTZ 50-12.5 mg, and Glipizide 10 mg BID as directed. However, he reports a missed dose of Metformin this morning. Pt is also taking Lovastatin 20 mg once daily . No complaints with any of his current medications.  Pt has seen his dentist in the last 6 months. However, he denies seeing a opthamologist recently. He  plans to follow up within the coming weeks.   Pt states he quit smoking in the 90's. Does not recall exact date.  No other concerns this visit.  Patient Active Problem List   Diagnosis Date Noted   Gout 08/12/2011   Hypertension 08/12/2011   High cholesterol 08/12/2011   OSA (obstructive sleep apnea) 08/12/2011   Diabetes type 2, uncontrolled 08/12/2011   Past Medical History  Diagnosis Date   Diabetes mellitus without complication    Hypertension    High cholesterol    No past surgical history on file. Allergies  Allergen Reactions   Lisinopril Cough   Penicillins Other (See Comments)    Passed out   Prior to Admission medications   Medication Sig Start Date End Date Taking? Authorizing Provider  aspirin EC 81 MG tablet Take 1 tablet (81 mg total) by mouth daily. 05/31/13  Yes Lauren Burnetta Sabin, PA-C  colchicine 0.6 MG tablet Take 1-2 tablets (0.6-1.2 mg total) by mouth daily as needed (for gout flare). 2 po at onset of flair and then repeat in 1 h for acute gout attack then 1 po qd until pain free 05/31/13  Yes Lauren Burnetta Sabin, PA-C  felodipine (PLENDIL) 10 MG 24 hr tablet Take 1 tablet (10 mg total) by mouth daily. 07/27/13  Yes Mancel Bale, PA-C  glipiZIDE (GLUCOTROL) 10 MG tablet Take 1 tablet (10 mg total) by mouth 2 (two) times daily before a meal. 07/27/13  Yes Mancel Bale, PA-C  glucose blood test strip Use as directed 05/31/13  Yes Lauren Burnetta Sabin, PA-C  losartan-hydrochlorothiazide (HYZAAR) 50-12.5 MG per tablet Take 1 tablet by mouth daily. 07/27/13  Yes Mancel Bale, PA-C  lovastatin (MEVACOR) 20 MG tablet Take 1 tablet (20 mg total) by mouth at bedtime. 07/27/13  Yes Mancel Bale, PA-C  metFORMIN (GLUCOPHAGE) 500 MG tablet Take 1 tablet (500 mg total) by mouth 2 (two) times daily with a meal. 07/27/13  Yes Mancel Bale, PA-C  metoprolol succinate (TOPROL-XL) 100 MG 24 hr tablet Take 1 tablet (100 mg total) by mouth daily. 07/27/13  Yes Mancel Bale, PA-C    History   Social History   Marital Status: Single    Spouse Name: N/A    Number of Children: N/A   Years of Education: N/A   Occupational History   Musician    Social History Main Topics   Smoking status: Former Smoker   Smokeless tobacco: Not on file   Alcohol Use: No   Drug Use: No   Sexual Activity: Yes   Other Topics Concern   Not on file   Social History Narrative   Married. Education: college.     Review of Systems  Constitutional: Negative for fatigue and unexpected weight change.  Eyes: Negative for visual disturbance.  Respiratory: Positive for cough and chest tightness. Negative for shortness of breath and wheezing.   Cardiovascular: Positive for chest pain. Negative for palpitations and leg swelling.  Gastrointestinal: Negative for abdominal pain and blood in stool.  Neurological: Negative for dizziness, light-headedness and headaches.    Danley Danker Vitals:   10/24/13 1633  BP: 146/82  Pulse: 80  Temp: 98 F (36.7 C)  TempSrc: Oral  Resp: 18  SpO2: 98%    Objective:  Physical Exam  Vitals reviewed. Constitutional: He is oriented to person, place, and time. He appears well-developed and well-nourished.  HENT:  Head: Normocephalic and atraumatic.  Right Ear: Tympanic membrane, external ear and ear canal normal.  Left Ear: Tympanic membrane, external ear and ear canal normal.  Nose: No rhinorrhea.  Mouth/Throat: Oropharynx is clear and moist and mucous membranes are normal. No oropharyngeal exudate or posterior oropharyngeal erythema.  Eyes: Conjunctivae and EOM are normal. Pupils are equal, round, and reactive to light.  Neck: Neck supple. No JVD present. Carotid bruit is not present.  Cardiovascular: Normal rate, regular rhythm, normal heart sounds and intact distal pulses.  Exam reveals no gallop and no friction rub.   No murmur heard. Pulmonary/Chest: Effort normal and breath sounds normal. He has no wheezes. He has no rhonchi. He has  no rales.  Abdominal: Soft. There is no tenderness.  Musculoskeletal: He exhibits no edema.  No lower extremity edema  Lymphadenopathy:    He has no cervical adenopathy.  Neurological: He is alert and oriented to person, place, and time.  Skin: Skin is warm and dry. No rash noted.  Psychiatric: He has a normal mood and affect. His behavior is normal.    EKG: SR, RSR' V1, no apparent changes from 05/2013 EKG.   Results for orders placed in visit on 10/24/13  GLUCOSE, POCT (MANUAL RESULT ENTRY)      Result Value Ref Range   POC Glucose 105 (*) 70 - 99 mg/dl  POCT GLYCOSYLATED HEMOGLOBIN (HGB A1C)      Result Value Ref Range   Hemoglobin A1C 7.9       Assessment & Plan:   YONIEL ARKWRIGHT is a  68 y.o. male Cough - Plan: EKG 12-Lead, POCT glucose (manual entry), POCT glycosylated hemoglobin (Hb A1C)  -VIRAL uri VS. Lower resp. tract infection - Plan: sx care with mucinex and saline NS, but Rx for Zpak if needed if sx's not improving next 4-5 days.  DM (diabetes mellitus), type 2, uncontrolled -   - improved, not quite at goal, but will keep meds at same doses with plan on diet then exercise changes once eval by cardiology.   Hypertension -   -not at control here or overall home readings with goal below 140/90. will increase losartan component of ACE/thiazide combo. Orthostatic precautions.   Chest pain -   -no recent changes, but discussed ASCVD risks of HTN and DM2.  Will refer to Cardiology to discuss stress testing. Rtc/ER precautions.    Meds ordered this encounter  Medications   losartan-hydrochlorothiazide (HYZAAR) 100-12.5 MG per tablet    Sig: Take 1 tablet by mouth daily.    Dispense:  90 tablet    Refill:  1   azithromycin (ZITHROMAX) 250 MG tablet    Sig: Take 2 pills by mouth on day 1, then 1 pill by mouth per day on days 2 through 5.    Dispense:  6 each    Refill:  0   Patient Instructions  For your cough - you likely had a virus and if continues to  improve - can treat with Mucinex for cough, saline nasal spray for nasal congestion. If not improving in next 3-4 days - can fill antibiotic.   For blood pressure - will change to new dose of losartan (100/12/5mg  - one per day. Keep a record of your blood pressures outside of the office and bring them to the next office visit. Watch for low blood pressure symptoms/lightheadedness.   For diabetes - not quite at goal (A1C less than 7), but much better today. Continue same medicines, but really work on diet, and once evaluated by cardiology - exercise as instructed. Recheck these levels in 3 months and if still elevated, can adjust doses of meds at that time. We will call you about that appointment.   For your chest symptoms, although these sound stable - I will refer you to cardiologist for further evaluation and to discuss stress testing. Return to the clinic or go to the nearest emergency room/call 911 if any of your symptoms worsen or new symptoms occur.  Will likely need to be on a stronger dose of cholesterol medicine (Lipitor or Crestor) but can also discuss this with cardiology.           I personally performed the services described in this documentation, which was scribed in my presence. The recorded information has been reviewed and is accurate.

## 2013-10-30 ENCOUNTER — Encounter: Payer: Self-pay | Admitting: Cardiology

## 2013-10-31 NOTE — Progress Notes (Signed)
Scheduled appt with dr Carlota Raspberry 7/6 @ 2pm for a 3 month reck

## 2013-11-21 ENCOUNTER — Institutional Professional Consult (permissible substitution): Payer: Medicare Other | Admitting: Cardiology

## 2013-11-23 ENCOUNTER — Ambulatory Visit: Payer: Medicare Other | Admitting: *Deleted

## 2013-11-23 VITALS — Ht 63.0 in | Wt 205.8 lb

## 2013-11-23 DIAGNOSIS — Z86010 Personal history of colonic polyps: Secondary | ICD-10-CM

## 2013-11-23 MED ORDER — NA SULFATE-K SULFATE-MG SULF 17.5-3.13-1.6 GM/177ML PO SOLN: 1.0000 | Freq: Once | ORAL | Status: DC

## 2013-11-23 NOTE — Progress Notes (Signed)
No egg or soy allergy. No anesthesia problems.  No home O2.  No diet meds.  

## 2013-11-26 ENCOUNTER — Ambulatory Visit (INDEPENDENT_AMBULATORY_CARE_PROVIDER_SITE_OTHER): Payer: Medicare Other | Admitting: Cardiology

## 2013-11-26 ENCOUNTER — Encounter: Payer: Self-pay | Admitting: Cardiology

## 2013-11-26 VITALS — BP 168/83 | HR 78 | Ht 63.0 in | Wt 206.8 lb

## 2013-11-26 DIAGNOSIS — R0600 Dyspnea, unspecified: Secondary | ICD-10-CM | POA: Insufficient documentation

## 2013-11-26 DIAGNOSIS — R0609 Other forms of dyspnea: Secondary | ICD-10-CM

## 2013-11-26 DIAGNOSIS — R0989 Other specified symptoms and signs involving the circulatory and respiratory systems: Secondary | ICD-10-CM

## 2013-11-26 DIAGNOSIS — R011 Cardiac murmur, unspecified: Secondary | ICD-10-CM

## 2013-11-26 MED ORDER — NEBIVOLOL HCL 10 MG PO TABS: 10.0000 mg | ORAL_TABLET | Freq: Every day | ORAL | Status: DC

## 2013-11-26 NOTE — Patient Instructions (Signed)
STOP THE METOPROLOL   START BYSTOLIC 10 MG DAILY  Your physician has requested that you have an echocardiogram. Echocardiography is a painless test that uses sound waves to create images of your heart. It provides your doctor with information about the size and shape of your heart and how well your heart's chambers and valves are working. This procedure takes approximately one hour. There are no restrictions for this procedure.  Your physician has requested that you have en exercise stress myoview. For further information please visit [email or web address]. Please follow instruction sheet, as given.  Your physician recommends that you schedule a follow-up appointment in: [place]

## 2013-11-26 NOTE — Progress Notes (Signed)
Patient ID: AURTHER HARLIN, male   DOB: 1945/09/28, 68 y.o.   MRN: [id number]    Patient Name: Antonio Wells Date of Encounter: 11/26/2013  Primary Care Provider:  [name], MD Primary Cardiologist:  [name]  Problem List   Past Medical History  Diagnosis Date  . Diabetes mellitus without complication   . Hypertension   . High cholesterol   . Gout   . Heart murmur   . Sleep apnea    Past Surgical History  Procedure Laterality Date  . Colonoscopy  2008   Allergies  Allergies  Allergen Reactions  . Lisinopril Cough  . Penicillins Other (See Comments)    Passed out   HPI  A pleasant 68 year old male with prior medical history of insulin-dependent diabetes mellitus known for the last 5 years, hypertension diagnosed 15 years ago, hyperlipidemia, obstructive sleep apnea, and prior history of smoking, quit 15 years ago who is coming with concern of dyspnea on exertion.the patient is currently retired and states that he used to exercise on a regular basis. Specifically he was use a stationary bike and exercise for up to an hour 3 times a week. However shows he also states that lately he hasn't been that active and noticed that he gets short of breath easily. This is a episodic sometimes he can exercise no problem and then other times he gets short of breath there quickly for example after walking one or 2 flight of stairs. He is very compliant with his medications. He quit smoking 15 years ago. He was prescribed a CPAP machine for obstructive sleep apnea but is not using it. He denies any chest pain left arm pain jaw pain or back pain, no palpitations or syncope no orthopnea or lower extremity edema.  The patient also states that his blood pressure has been uncontrolled lately.  Home Medications  Prior to Admission medications   Medication Sig Start Date End Date Taking? Authorizing Provider  aspirin EC 81 MG tablet Take 1 tablet (81 mg total) by mouth daily.  05/31/13  Yes Lauren Burnetta Sabin, PA-C  colchicine 0.6 MG tablet Take 1-2 tablets (0.6-1.2 mg total) by mouth daily as needed (for gout flare). 2 po at onset of flair and then repeat in 1 h for acute gout attack then 1 po qd until pain free 05/31/13  Yes Lauren Burnetta Sabin, PA-C  felodipine (PLENDIL) 10 MG 24 hr tablet Take 1 tablet (10 mg total) by mouth daily. 07/27/13  Yes Mancel Bale, PA-C  glipiZIDE (GLUCOTROL) 10 MG tablet Take 1 tablet (10 mg total) by mouth 2 (two) times daily before a meal. 07/27/13  Yes Mancel Bale, PA-C  glucose blood test strip Use as directed 05/31/13  Yes Lauren Burnetta Sabin, PA-C  losartan-hydrochlorothiazide (HYZAAR) 100-12.5 MG per tablet Take 1 tablet by mouth daily. 10/24/13  Yes [name], MD  lovastatin (MEVACOR) 20 MG tablet Take 1 tablet (20 mg total) by mouth at bedtime. 07/27/13  Yes Mancel Bale, PA-C  metFORMIN (GLUCOPHAGE) 500 MG tablet Take 1 tablet (500 mg total) by mouth 2 (two) times daily with a meal. 07/27/13  Yes Mancel Bale, PA-C  metoprolol succinate (TOPROL-XL) 100 MG 24 hr tablet Take 1 tablet (100 mg total) by mouth daily. 07/27/13  Yes Mancel Bale, PA-C    Family History  Family History  Problem Relation Age of Onset  . Hypertension Mother   . Hypertension Father   . Cancer Brother   .  Stroke Brother   . Colon cancer Neg Hx     Social History  History   Social History  . Marital Status: Single    Spouse Name: N/A    Number of Children: N/A  . Years of Education: N/A   Occupational History  . Musician    Social History Main Topics  . Smoking status: Former Research scientist (life sciences)  . Smokeless tobacco: Never Used  . Alcohol Use: Yes     Comment: occasionally  . Drug Use: No  . Sexual Activity: Yes   Other Topics Concern  . Not on file   Social History Narrative   Married. Education: college.     Review of Systems, as per HPI, otherwise negative General:  No chills, fever, night sweats or weight changes.  Cardiovascular:  No chest pain,  dyspnea on exertion, edema, orthopnea, palpitations, paroxysmal nocturnal dyspnea. Dermatological: No rash, lesions/masses Respiratory: No cough, dyspnea Urologic: No hematuria, dysuria Abdominal:   No nausea, vomiting, diarrhea, bright red blood per rectum, melena, or hematemesis Neurologic:  No visual changes, wkns, changes in mental status. All other systems reviewed and are otherwise negative except as noted above.  Physical Exam  Blood pressure 168/83, pulse 78, height 5\' 3"  (1.6 m), weight 206 lb 12.8 oz (93.804 kg).  General: Pleasant, NAD Psych: Normal affect. Neuro: Alert and oriented X 3. Moves all extremities spontaneously. HEENT: Normal  Neck: Supple without bruits or JVD. Lungs:  Resp regular and unlabored, CTA. Heart: RRR no s3, s4, or 2/6 systolic murmur.. Abdomen: Soft, non-tender, non-distended, BS + x 4.  Extremities: No clubbing, cyanosis or edema. DP/PT/Radials 2+ and equal bilaterally.  Labs:  No results found for this basename: CKTOTAL, CKMB, TROPONINI,  in the last 72 hours Lab Results  Component Value Date   WBC 4.7 05/31/2013   HGB 15.3 05/31/2013   HCT 45.0 05/31/2013   MCV 90.2 05/31/2013   PLT 199 05/31/2013    No results found for this basename: DDIMER   No components found with this basename: POCBNP,     Component Value Date/Time   NA 141 07/02/2013 1044   K 4.2 07/02/2013 1044   CL 102 07/02/2013 1044   CO2 30 07/02/2013 1044   GLUCOSE 195* 07/02/2013 1044   BUN 13 07/02/2013 1044   CREATININE 1.29 07/02/2013 1044   CREATININE 1.60* 05/31/2013 0208   CALCIUM 9.5 07/02/2013 1044   PROT 7.1 07/02/2013 1044   ALBUMIN 4.7 07/02/2013 1044   AST 19 07/02/2013 1044   ALT 30 07/02/2013 1044   ALKPHOS 47 07/02/2013 1044   BILITOT 0.6 07/02/2013 1044   GFRNONAA 57* 05/31/2013 0205   GFRAA 66* 05/31/2013 0205   Lab Results  Component Value Date   CHOL 156 07/02/2013   HDL 47 07/02/2013   LDLCALC 84 07/02/2013   TRIG 126 07/02/2013    Accessory Clinical  Findings  Echocardiogram - none  ECG - Sinus rhythm, nonspecific ST-T wave several days.    Assessment & Plan  68 year old male with multiple risk factors for coronary artery disease including prior smoking, obesity, insulin-dependent diabetes mellitus, hyperlipidemia, uncontrolled hypertension and untreated obstructive sleep apnea who is coming with concerns of   1. Dyspnea on exertion. EKG shows nonspecific ST-T wave abnormalities. Patient states that he has difficulty walking but would try to walk on a treadmill. Therefore we will order an exercise nuclear stress test to evaluate for possible ischemia. If unable to achieve the goal with exercise we would convert to a  Lexiscan test.  2. Hypertension - we'll switch metoprolol XL Bystolic (nebivolol) 10 mg daily and order an echocardiogram to evaluate for systolic diastolic function and degree of LVH.  3. Murmur - we will order echocardiogram to evaluate  4. Hyperlipidemia - followed by his primary care currently on lovastatin.  Followup in 1 months.   [name], MD, [facility] 11/26/2013, 2:36 PM

## 2013-12-07 ENCOUNTER — Encounter: Payer: Self-pay | Admitting: Internal Medicine

## 2013-12-20 ENCOUNTER — Ambulatory Visit (HOSPITAL_COMMUNITY): Payer: Medicare Other | Attending: Cardiovascular Disease | Admitting: Radiology

## 2013-12-20 ENCOUNTER — Ambulatory Visit (HOSPITAL_BASED_OUTPATIENT_CLINIC_OR_DEPARTMENT_OTHER): Payer: Medicare Other | Admitting: Radiology

## 2013-12-20 VITALS — BP 169/87 | HR 61 | Ht 63.0 in | Wt 204.0 lb

## 2013-12-20 DIAGNOSIS — R011 Cardiac murmur, unspecified: Secondary | ICD-10-CM | POA: Diagnosis not present

## 2013-12-20 DIAGNOSIS — R0602 Shortness of breath: Secondary | ICD-10-CM

## 2013-12-20 DIAGNOSIS — R0609 Other forms of dyspnea: Principal | ICD-10-CM

## 2013-12-20 DIAGNOSIS — R0989 Other specified symptoms and signs involving the circulatory and respiratory systems: Principal | ICD-10-CM | POA: Insufficient documentation

## 2013-12-20 MED ORDER — TECHNETIUM TC 99M SESTAMIBI GENERIC - CARDIOLITE
33.0000 | Freq: Once | INTRAVENOUS | Status: AC | PRN
  Administered 2013-12-20: 33 via INTRAVENOUS

## 2013-12-20 MED ORDER — REGADENOSON 0.4 MG/5ML IV SOLN
0.4000 mg | Freq: Once | INTRAVENOUS | Status: AC
  Administered 2013-12-20: 0.4 mg via INTRAVENOUS

## 2013-12-20 MED ORDER — TECHNETIUM TC 99M SESTAMIBI GENERIC - CARDIOLITE
11.0000 | Freq: Once | INTRAVENOUS | Status: AC | PRN
  Administered 2013-12-20: 11 via INTRAVENOUS

## 2013-12-20 NOTE — Progress Notes (Signed)
[place] [facility] NUCLE[facility] [address] [place], [place] [address] [phone]    Cardiology Nuclear Med Study  Antonio Wells is a 68 y.o. male     MRN : [id number]     DOB: 18-Nov-1945  Procedure Date: 12/20/2013  Nuclear Med Background Indication for Stress Test:  Evaluation for Ischemia History:  No known CAD, Echo 11/2013 (pending) Cardiac Risk Factors: History of Smoking, Hypertension, Lipids and NIDDM  Symptoms:  DOE   Nuclear Pre-Procedure Caffeine/Decaff Intake:  None> 12hrs NPO After: 9:00pm   Lungs:  clear O2 Sat: 98% on room air. IV 0.9% NS with Angio Cath:  22g  IV Site: R Antecubital x 1, tolerated well IV Started by:  Irven Baltimore, RN  Chest Size (in):  46 Cup Size: n/a  Height: 5\' 3"  (1.6 m)  Weight:  204 lb (92.534 kg)  BMI:  Body mass index is 36.15 kg/(m^2). Tech Comments:  Held Bystolic x 24 hrs. No medications (Metformin, or Glipizide) this am. Irven Baltimore, RN.    Nuclear Med Study 1 or 2 day study: 1 day  Stress Test Type:  Treadmill/Lexiscan  Reading MD: N/A  Order Authorizing Provider:  Ena Dawley, MD  Resting Radionuclide: Technetium 67m Sestamibi  Resting Radionuclide Dose: 11.0 mCi   Stress Radionuclide:  Technetium 54m Sestamibi  Stress Radionuclide Dose: 33.0 mCi           Stress Protocol Rest HR: 61 Stress HR: 114  Rest BP: 169/87 Stress BP: 154/79  Exercise Time (min): n/a METS: n/a           Dose of Adenosine (mg):  n/a Dose of Lexiscan: 0.4 mg  Dose of Atropine (mg): n/a Dose of Dobutamine: n/a mcg/kg/min (at max HR)  Stress Test Technologist: Glade Lloyd, BS-ES  Nuclear Technologist:  Charlton Amor, CNMT     Rest Procedure:  Myocardial perfusion imaging was performed at rest 45 minutes following the intravenous administration of Technetium 80m Sestamibi. 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  Quantitative spect images were obtained after a 45-minute delay.  Attempted to walk patient on the [place] Protocol but the patient had hip and back pain.  Switched to [place].  During the infusion the patient complained of SOB and stomach feeling uneasy.  These symptoms began to resolve in recovery.  Stress ECG: Nonspecific T wave abnormality at peak exercise and in recovery  QPS Raw Data Images:  There is interference from nuclear activity from structures below the diaphragm. This does not affect the ability to read the study. Stress Images:  Normal homogeneous uptake in all areas of the myocardium. Rest Images:  Normal homogeneous uptake in all areas of the myocardium. Subtraction (SDS):  Normal Transient Ischemic Dilatation (Normal <1.22):  1.08 Lung/Heart Ratio (Normal <0.45):  0.38  Quantitative Gated Spect Images QGS EDV:  106 ml QGS ESV:  50 ml  Impression Exercise Capacity:  Lexiscan with low level exercise. BP Response:  Normal blood pressure response. Clinical Symptoms:  There is dyspnea. ECG Impression:  Nonspecific T wave abnormality during exercise and Lexiscan infusion and in recovery Comparison with Prior Nuclear Study: No images to compare  Overall Impression:  Normal stress nuclear study.  LV Ejection Fraction: 53%.  LV Wall Motion:  NL LV Function; NL Wall Motion  Signed: Fransico Him, MD [facility]

## 2013-12-20 NOTE — Progress Notes (Signed)
Echocardiogram performed.  

## 2013-12-24 ENCOUNTER — Telehealth: Payer: Self-pay

## 2013-12-24 NOTE — Telephone Encounter (Signed)
Discussed with pt.   Using cane or assistive device to walk long distances to to knee and hip pain, and short of breath with walking - seen by cardiologist recently for stress testing.  Card completed, but advised to rtc to discuss this areas in further detail to determine if OA or other cause and treatment options.

## 2013-12-24 NOTE — Telephone Encounter (Signed)
Please advise 

## 2013-12-24 NOTE — Telephone Encounter (Signed)
Pt brought handicap placard form by 104 and asked if Dr Carlota Raspberry could fill it out. I am placing the form at Amy L's desk and let the pt know Dr Carlota Raspberry will take care of it when he is able.   Call pt when ready: [phone]  bf

## 2013-12-25 ENCOUNTER — Telehealth: Payer: Self-pay | Admitting: *Deleted

## 2013-12-25 NOTE — Telephone Encounter (Signed)
Message copied by Earvin Hansen on Tue Dec 25, 2013 10:05 AM ------      Message from: [name]      Created: Mon Dec 24, 2013  8:10 AM       Normal stress test, you please let him know.      Take ------

## 2013-12-25 NOTE — Telephone Encounter (Signed)
Advised patient of results.  

## 2014-01-01 ENCOUNTER — Encounter: Payer: Self-pay | Admitting: Cardiology

## 2014-01-01 ENCOUNTER — Ambulatory Visit (INDEPENDENT_AMBULATORY_CARE_PROVIDER_SITE_OTHER): Payer: Medicare Other | Admitting: Cardiology

## 2014-01-01 VITALS — BP 140/82 | HR 66 | Ht 63.0 in | Wt 207.0 lb

## 2014-01-01 DIAGNOSIS — I080 Rheumatic disorders of both mitral and aortic valves: Secondary | ICD-10-CM

## 2014-01-01 NOTE — Progress Notes (Signed)
Patient ID: Antonio KAWANO, male   DOB: May 02, 1946, 68 y.o.   MRN: [id number]    Patient Name: Antonio Wells Date of Encounter: 01/01/2014  Primary Care Provider:  [name], MD Primary Cardiologist:  [name]  Problem List   Past Medical History  Diagnosis Date  . Diabetes mellitus without complication   . Hypertension   . High cholesterol   . Gout   . Heart murmur   . Sleep apnea    Past Surgical History  Procedure Laterality Date  . Colonoscopy  2008   Allergies  Allergies  Allergen Reactions  . Lisinopril Cough  . Penicillins Other (See Comments)    Passed out   HPI  A pleasant 68 year old male with prior medical history of insulin-dependent diabetes mellitus known for the last 5 years, hypertension diagnosed 15 years ago, hyperlipidemia, obstructive sleep apnea, and prior history of smoking, quit 15 years ago who is coming with concern of dyspnea on exertion.the patient is currently retired and states that he used to exercise on a regular basis. Specifically he was use a stationary bike and exercise for up to an hour 3 times a week. However shows he also states that lately he hasn't been that active and noticed that he gets short of breath easily. This is a episodic sometimes he can exercise no problem and then other times he gets short of breath there quickly for example after walking one or 2 flight of stairs. He is very compliant with his medications. He quit smoking 15 years ago. He was prescribed a CPAP machine for obstructive sleep apnea but is not using it. He denies any chest pain left arm pain jaw pain or back pain, no palpitations or syncope no orthopnea or lower extremity edema.  The patient also states that his blood pressure has been uncontrolled lately.  Since the last visit patient blood pressure has been under much better control. He states that his shortness of breath has significantly improved since the blood pressure has been better  controlled. He denies any chest pain palpitation or syncope.he denies any side effect Bystolic.  Home Medications  Prior to Admission medications   Medication Sig Start Date End Date Taking? Authorizing Provider  aspirin EC 81 MG tablet Take 1 tablet (81 mg total) by mouth daily. 05/31/13  Yes Lauren Burnetta Sabin, PA-C  colchicine 0.6 MG tablet Take 1-2 tablets (0.6-1.2 mg total) by mouth daily as needed (for gout flare). 2 po at onset of flair and then repeat in 1 h for acute gout attack then 1 po qd until pain free 05/31/13  Yes Lauren Burnetta Sabin, PA-C  felodipine (PLENDIL) 10 MG 24 hr tablet Take 1 tablet (10 mg total) by mouth daily. 07/27/13  Yes Mancel Bale, PA-C  glipiZIDE (GLUCOTROL) 10 MG tablet Take 1 tablet (10 mg total) by mouth 2 (two) times daily before a meal. 07/27/13  Yes Mancel Bale, PA-C  glucose blood test strip Use as directed 05/31/13  Yes Lauren Burnetta Sabin, PA-C  losartan-hydrochlorothiazide (HYZAAR) 100-12.5 MG per tablet Take 1 tablet by mouth daily. 10/24/13  Yes [name], MD  lovastatin (MEVACOR) 20 MG tablet Take 1 tablet (20 mg total) by mouth at bedtime. 07/27/13  Yes Mancel Bale, PA-C  metFORMIN (GLUCOPHAGE) 500 MG tablet Take 1 tablet (500 mg total) by mouth 2 (two) times daily with a meal. 07/27/13  Yes Mancel Bale, PA-C  metoprolol succinate (TOPROL-XL) 100 MG 24 hr  tablet Take 1 tablet (100 mg total) by mouth daily. 07/27/13  Yes Mancel Bale, PA-C    Family History  Family History  Problem Relation Age of Onset  . Hypertension Mother   . Hypertension Father   . Cancer Brother   . Stroke Brother   . Colon cancer Neg Hx     Social History  History   Social History  . Marital Status: Single    Spouse Name: N/A    Number of Children: N/A  . Years of Education: N/A   Occupational History  . Musician    Social History Main Topics  . Smoking status: Former Research scientist (life sciences)  . Smokeless tobacco: Never Used  . Alcohol Use: Yes     Comment: occasionally  . Drug  Use: No  . Sexual Activity: Yes   Other Topics Concern  . Not on file   Social History Narrative   Married. Education: college.     Review of Systems, as per HPI, otherwise negative General:  No chills, fever, night sweats or weight changes.  Cardiovascular:  No chest pain, dyspnea on exertion, edema, orthopnea, palpitations, paroxysmal nocturnal dyspnea. Dermatological: No rash, lesions/masses Respiratory: No cough, dyspnea Urologic: No hematuria, dysuria Abdominal:   No nausea, vomiting, diarrhea, bright red blood per rectum, melena, or hematemesis Neurologic:  No visual changes, wkns, changes in mental status. All other systems reviewed and are otherwise negative except as noted above.  Physical Exam  Blood pressure 140/82, pulse 66, height 5\' 3"  (1.6 m), weight 207 lb (93.895 kg).  General: Pleasant, NAD Psych: Normal affect. Neuro: Alert and oriented X 3. Moves all extremities spontaneously. HEENT: Normal  Neck: Supple without bruits or JVD. Lungs:  Resp regular and unlabored, CTA. Heart: RRR no s3, s4, or 2/6 systolic murmur.. Abdomen: Soft, non-tender, non-distended, BS + x 4.  Extremities: No clubbing, cyanosis or edema. DP/PT/Radials 2+ and equal bilaterally.  Labs:  No results found for this basename: CKTOTAL, CKMB, TROPONINI,  in the last 72 hours Lab Results  Component Value Date   WBC 4.7 05/31/2013   HGB 15.3 05/31/2013   HCT 45.0 05/31/2013   MCV 90.2 05/31/2013   PLT 199 05/31/2013    No results found for this basename: DDIMER   No components found with this basename: POCBNP,     Component Value Date/Time   NA 141 07/02/2013 1044   K 4.2 07/02/2013 1044   CL 102 07/02/2013 1044   CO2 30 07/02/2013 1044   GLUCOSE 195* 07/02/2013 1044   BUN 13 07/02/2013 1044   CREATININE 1.29 07/02/2013 1044   CREATININE 1.60* 05/31/2013 0208   CALCIUM 9.5 07/02/2013 1044   PROT 7.1 07/02/2013 1044   ALBUMIN 4.7 07/02/2013 1044   AST 19 07/02/2013 1044   ALT 30 07/02/2013  1044   ALKPHOS 47 07/02/2013 1044   BILITOT 0.6 07/02/2013 1044   GFRNONAA 57* 05/31/2013 0205   GFRAA 66* 05/31/2013 0205   Lab Results  Component Value Date   CHOL 156 07/02/2013   HDL 47 07/02/2013   LDLCALC 84 07/02/2013   TRIG 126 07/02/2013    Accessory Clinical Findings  Echocardiogram -12/20/2013   - Left ventricle: The cavity size was normal. There was mild concentric hypertrophy. Systolic function was normal. The estimated ejection fraction was in the range of 60% to 65%. Wall motion was normal; there were no regional wall motion abnormalities. Doppler parameters are consistent with abnormal left ventricular relaxation (grade 1 diastolic dysfunction). The E/e&'  ratio is >15, suggesting elevated LV filling pressure. - Aortic valve: Trileaflet; mildly calcified leaflets. There is mild aortic stenosis. Peak and mean gradients of 18 and 13 mmHg, respectively. Based on an LVOT diameter of 2.2, the calculated AVA is 1.5-1.6 cm2. - Mitral valve: There was mild regurgitation. - Atrial septum: No defect or patent foramen ovale was identified. - Tricuspid valve: There was mild regurgitation. - Pulmonary arteries: PA peak pressure: 37 mm Hg (S). Mild pulmonary hypertension.  Impressions:  - LVEF of 60-65%, mild concentric LVH. Diastolic dysfunction with elevated LV filling pressure. Mild aortic stenosis - AVA around 1.6 cm2. Mild pulmonary hypertension - RVSP 37 mmHg.  ECG - Sinus rhythm, nonspecific ST-T wave several days.  Lexiscan nuclear stress test; 12/21/13 Quantitative Gated Spect Images  QGS EDV: 106 ml  QGS ESV: 50 ml  Impression  Exercise Capacity: Lexiscan with low level exercise.  BP Response: Normal blood pressure response.  Clinical Symptoms: There is dyspnea.  ECG Impression: Nonspecific T wave abnormality during exercise and Lexiscan infusion and in recovery  Comparison with Prior Nuclear Study: No images to compare  Overall Impression: Normal stress nuclear  study.  LV Ejection Fraction: 53%. LV Wall Motion: NL LV Function; NL Wall Motion   Assessment & Plan  68 year old male with multiple risk factors for coronary artery disease including prior smoking, obesity, insulin-dependent diabetes mellitus, hyperlipidemia, uncontrolled hypertension and untreated obstructive sleep apnea who is coming with concerns of   1. Dyspnea on exertion. EKG shows nonspecific ST-T wave abnormalities.  the patient only performed a lower of exercise and a stress test was converted to [employer]. However it showed no prior scar and no ischemia. It showed normal blood pressure response to stress. Since the patient is deconditioned we advised on proper exercise and his frequency.  2. Hypertension - since we  switched metoprolol XL to Bystolic (nebivolol) 10 mg daily  blood pressure controlled and just borderline on today's doctor's visit. Echocardiogram showed mild LVH impaired for observation and mild pulmonary hypertension. Bystolic should improve all of the above abnormalities.  3. Murmur - mild mitral regurgitation and mild aortic stenosis, but will follow with echocardiogram in 1 year   4. Hyperlipidemia - followed by his primary care currently on lovastatin.  Followup in 6 months.   [name], MD, [facility] 01/01/2014, 12:05 PM

## 2014-01-01 NOTE — Patient Instructions (Signed)
Your physician wants you to follow-up in: with Dr Meda Coffee in 6 months  You will receive a reminder letter in the mail two months in advance. If you don't receive a letter, please call our office to schedule the follow-up appointment.

## 2014-01-02 DIAGNOSIS — I080 Rheumatic disorders of both mitral and aortic valves: Secondary | ICD-10-CM | POA: Insufficient documentation

## 2014-01-28 ENCOUNTER — Encounter: Payer: Self-pay | Admitting: Family Medicine

## 2014-01-28 ENCOUNTER — Ambulatory Visit (INDEPENDENT_AMBULATORY_CARE_PROVIDER_SITE_OTHER): Payer: Medicare Other | Admitting: Family Medicine

## 2014-01-28 VITALS — BP 126/72 | HR 69 | Temp 97.2°F | Resp 16 | Ht 63.0 in | Wt 204.0 lb

## 2014-01-28 DIAGNOSIS — M25562 Pain in left knee: Secondary | ICD-10-CM

## 2014-01-28 DIAGNOSIS — M25569 Pain in unspecified knee: Secondary | ICD-10-CM | POA: Diagnosis not present

## 2014-01-28 DIAGNOSIS — IMO0002 Reserved for concepts with insufficient information to code with codable children: Secondary | ICD-10-CM

## 2014-01-28 DIAGNOSIS — M25551 Pain in right hip: Secondary | ICD-10-CM

## 2014-01-28 DIAGNOSIS — I10 Essential (primary) hypertension: Secondary | ICD-10-CM

## 2014-01-28 DIAGNOSIS — E785 Hyperlipidemia, unspecified: Secondary | ICD-10-CM

## 2014-01-28 DIAGNOSIS — E1165 Type 2 diabetes mellitus with hyperglycemia: Secondary | ICD-10-CM

## 2014-01-28 DIAGNOSIS — G4733 Obstructive sleep apnea (adult) (pediatric): Secondary | ICD-10-CM

## 2014-01-28 DIAGNOSIS — M25559 Pain in unspecified hip: Secondary | ICD-10-CM

## 2014-01-28 DIAGNOSIS — IMO0001 Reserved for inherently not codable concepts without codable children: Secondary | ICD-10-CM

## 2014-01-28 DIAGNOSIS — M545 Low back pain, unspecified: Secondary | ICD-10-CM

## 2014-01-28 DIAGNOSIS — M25561 Pain in right knee: Secondary | ICD-10-CM

## 2014-01-28 LAB — GLUCOSE, POCT (MANUAL RESULT ENTRY): POC Glucose: 219 mg/dl — AB (ref 70–99)

## 2014-01-28 LAB — POCT GLYCOSYLATED HEMOGLOBIN (HGB A1C): HEMOGLOBIN A1C: 8.7

## 2014-01-28 MED ORDER — LOSARTAN POTASSIUM-HCTZ 100-12.5 MG PO TABS: 1.0000 | ORAL_TABLET | Freq: Every day | ORAL | Status: DC

## 2014-01-28 MED ORDER — GLIPIZIDE 10 MG PO TABS: 10.0000 mg | ORAL_TABLET | Freq: Two times a day (BID) | ORAL | Status: DC

## 2014-01-28 MED ORDER — FELODIPINE ER 10 MG PO TB24: 10.0000 mg | ORAL_TABLET | Freq: Every day | ORAL | Status: DC

## 2014-01-28 MED ORDER — METFORMIN HCL 850 MG PO TABS: 850.0000 mg | ORAL_TABLET | Freq: Two times a day (BID) | ORAL | Status: DC

## 2014-01-28 MED ORDER — LOVASTATIN 20 MG PO TABS: 20.0000 mg | ORAL_TABLET | Freq: Every day | ORAL | Status: DC

## 2014-01-28 MED ORDER — NEBIVOLOL HCL 10 MG PO TABS: 10.0000 mg | ORAL_TABLET | Freq: Every day | ORAL | Status: DC
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
## 2014-01-28 NOTE — Patient Instructions (Addendum)
Pool based exercise, see the back care manual, tylenol if needed for aches in back, knees or hip, and if not improved - recheck in next 4-6 weeks. Return to the clinic or go to the nearest emergency room if any of your symptoms worsen or new symptoms occur. Increased metformin to 850mg  twice per day.  No other med changes at this time.  Return in next week for fasting lab only visit.  We will refer you back to sleep medicine to possible repeat sleep study and discuss CPAP.  You should receive a call or letter about your lab results within the next week to 10 days.

## 2014-02-01 DIAGNOSIS — M12869 Other specific arthropathies, not elsewhere classified, unspecified knee: Secondary | ICD-10-CM | POA: Diagnosis not present

## 2014-02-01 DIAGNOSIS — M25469 Effusion, unspecified knee: Secondary | ICD-10-CM | POA: Diagnosis not present

## 2014-02-01 DIAGNOSIS — M1710 Unilateral primary osteoarthritis, unspecified knee: Secondary | ICD-10-CM | POA: Diagnosis not present

## 2014-02-04 ENCOUNTER — Encounter: Payer: Self-pay | Admitting: Cardiology

## 2014-02-04 ENCOUNTER — Encounter: Payer: Self-pay | Admitting: Neurology

## 2014-02-04 ENCOUNTER — Telehealth: Payer: Self-pay | Admitting: *Deleted

## 2014-02-04 ENCOUNTER — Ambulatory Visit (INDEPENDENT_AMBULATORY_CARE_PROVIDER_SITE_OTHER): Payer: Medicare Other | Admitting: Neurology

## 2014-02-04 VITALS — BP 147/77 | HR 66 | Resp 16 | Ht 63.75 in | Wt 205.0 lb

## 2014-02-04 DIAGNOSIS — E669 Obesity, unspecified: Secondary | ICD-10-CM | POA: Insufficient documentation

## 2014-02-04 DIAGNOSIS — G4710 Hypersomnia, unspecified: Secondary | ICD-10-CM | POA: Diagnosis not present

## 2014-02-04 DIAGNOSIS — G4733 Obstructive sleep apnea (adult) (pediatric): Secondary | ICD-10-CM | POA: Diagnosis not present

## 2014-02-04 DIAGNOSIS — I10 Essential (primary) hypertension: Secondary | ICD-10-CM

## 2014-02-04 MED ORDER — METOPROLOL TARTRATE 50 MG PO TABS: 50.0000 mg | ORAL_TABLET | Freq: Two times a day (BID) | ORAL | Status: DC

## 2014-02-04 NOTE — Patient Instructions (Signed)
Sleep Apnea  Sleep apnea is a sleep disorder characterized by abnormal pauses in breathing while you sleep. When your breathing pauses, the level of oxygen in your blood decreases. This causes you to move out of deep sleep and into light sleep. As a result, your quality of sleep is poor, and the system that carries your blood throughout your body (cardiovascular system) experiences stress. If sleep apnea remains untreated, the following conditions can develop:  High blood pressure (hypertension).  Coronary artery disease.  Inability to achieve or maintain an erection (impotence).  Impairment of your thought process (cognitive dysfunction). There are three types of sleep apnea: 1. Obstructive sleep apnea--Pauses in breathing during sleep because of a blocked airway. 2. Central sleep apnea--Pauses in breathing during sleep because the area of the brain that controls your breathing does not send the correct signals to the muscles that control breathing. 3. Mixed sleep apnea--A combination of both obstructive and central sleep apnea. RISK FACTORS The following risk factors can increase your risk of developing sleep apnea:  Being overweight.  Smoking.  Having narrow passages in your nose and throat.  Being of older age.  Being male.  Alcohol use.  Sedative and tranquilizer use.  Ethnicity. Among individuals younger than 35 years, African Americans are at increased risk of sleep apnea. SYMPTOMS   Difficulty staying asleep.  Daytime sleepiness and fatigue.  Loss of energy.  Irritability.  Loud, heavy snoring.  Morning headaches.  Trouble concentrating.  Forgetfulness.  Decreased interest in sex. DIAGNOSIS  In order to diagnose sleep apnea, your caregiver will perform a physical examination. Your caregiver may suggest that you take a home sleep test. Your caregiver may also recommend that you spend the night in a sleep lab. In the sleep lab, several monitors record  information about your heart, lungs, and brain while you sleep. Your leg and arm movements and blood oxygen level are also recorded. TREATMENT The following actions may help to resolve mild sleep apnea:  Sleeping on your side.   Using a decongestant if you have nasal congestion.   Avoiding the use of depressants, including alcohol, sedatives, and narcotics.   Losing weight and modifying your diet if you are overweight. There also are devices and treatments to help open your airway:  Oral appliances. These are custom-made mouthpieces that shift your lower jaw forward and slightly open your bite. This opens your airway.  Devices that create positive airway pressure. This positive pressure "splints" your airway open to help you breathe better during sleep. The following devices create positive airway pressure:  Continuous positive airway pressure (CPAP) device. The CPAP device creates a continuous level of air pressure with an air pump. The air is delivered to your airway through a mask while you sleep. This continuous pressure keeps your airway open.  Nasal expiratory positive airway pressure (EPAP) device. The EPAP device creates positive air pressure as you exhale. The device consists of single-use valves, which are inserted into each nostril and held in place by adhesive. The valves create very little resistance when you inhale but create much more resistance when you exhale. That increased resistance creates the positive airway pressure. This positive pressure while you exhale keeps your airway open, making it easier to breath when you inhale again.  Bilevel positive airway pressure (BPAP) device. The BPAP device is used mainly in patients with central sleep apnea. This device is similar to the CPAP device because it also uses an air pump to deliver continuous air pressure   through a mask. However, with the BPAP machine, the pressure is set at two different levels. The pressure when you  exhale is lower than the pressure when you inhale.  Surgery. Typically, surgery is only done if you cannot comply with less invasive treatments or if the less invasive treatments do not improve your condition. Surgery involves removing excess tissue in your airway to create a wider passage way. Document Released: 07/02/2002 Document Revised: 11/06/2012 Document Reviewed: 11/18/2011 [facility] Patient Information 2015 [place], [place]. This information is not intended to replace advice given to you by your health care provider. Make sure you discuss any questions you have with your health care provider. Obesity Obesity is defined as having too much total body fat and a body mass index (BMI) of 30 or more. BMI is an estimate of body fat and is calculated from your height and weight. Obesity happens when you consume more calories than you can burn by exercising or performing daily physical tasks. Prolonged obesity can cause major illnesses or emergencies, such as:   A stroke.  Heart disease.  Diabetes.  Cancer.  Arthritis.  High blood pressure (hypertension).  High cholesterol.  Sleep apnea.  Erectile dysfunction.  Infertility problems. CAUSES   Regularly eating unhealthy foods.  Physical inactivity.  Certain disorders, such as an underactive thyroid (hypothyroidism), Cushing's syndrome, and polycystic ovarian syndrome.  Certain medicines, such as steroids, some depression medicines, and antipsychotics.  Genetics.  Lack of sleep. DIAGNOSIS  A caregiver can diagnose obesity after calculating your BMI. Obesity will be diagnosed if your BMI is 30 or higher.  There are other methods of measuring obesity levels. Some other methods include measuring your skin fold thickness, your waist circumference, and comparing your hip circumference to your waist circumference. TREATMENT  A healthy treatment program includes some or all of the following:  Long-term dietary changes.  Exercise  and physical activity.  Behavioral and lifestyle changes.  Medicine only under the supervision of your caregiver. Medicines may help, but only if they are used with diet and exercise programs. An unhealthy treatment program includes:  Fasting.  Fad diets.  Supplements and drugs. These choices do not succeed in long-term weight control.  HOME CARE INSTRUCTIONS   Exercise and perform physical activity as directed by your caregiver. To increase physical activity, try the following:  Use stairs instead of elevators.  Park farther away from store entrances.  Garden, bike, or walk instead of watching television or using the computer.  Eat healthy, low-calorie foods and drinks on a regular basis. Eat more fruits and vegetables. Use low-calorie cookbooks or take healthy cooking classes.  Limit fast food, sweets, and processed snack foods.  Eat smaller portions.  Keep a daily journal of everything you eat. There are many free websites to help you with this. It may be helpful to measure your foods so you can determine if you are eating the correct portion sizes.  Avoid drinking alcohol. Drink more water and drinks without calories.  Take vitamins and supplements only as recommended by your caregiver.  Weight-loss support groups, Nurse, mental health, counselors, and stress reduction education can also be very helpful. SEEK IMMEDIATE MEDICAL CARE IF:  You have chest pain or tightness.  You have trouble breathing or feel short of breath.  You have weakness or leg numbness.  You feel confused or have trouble talking.  You have sudden changes in your vision. MAKE SURE YOU:  Understand these instructions.  Will watch your condition.  Will get help  right away if you are not doing well or get worse. Document Released: 08/19/2004 Document Revised: 01/11/2012 Document Reviewed: 08/18/2011 [facility] Patient Information 2015 [place], [place]. This information is not intended to  replace advice given to you by your health care provider. Make sure you discuss any questions you have with your health care provider.

## 2014-02-04 NOTE — Telephone Encounter (Signed)
Pharmacy notified us that Bystolic is not covered. They suggested switching to Metoprolol, carvedilol, Atenolol/chlorthalidone. Please advise.  Thanks,

## 2014-02-04 NOTE — Progress Notes (Signed)
[facility]  Provider:  Larey Seat, [place] D  Referring Provider: [name], MD Primary Care Physician:  Antonio Agreste, MD  Chief Complaint  Patient presents with  . New Evaluation    Room 10  . Sleep consult    HPI:  Antonio Wells is a 68 y.o. male , who  is seen here as a referral  from Dr. Carlota Wells for sleep apnea evaluation,  Antonio Wells is afro-american , right handed gentleman with a known diagnosis of OSA, diagnosed in 2011. At the time he was still full time employed.  He was placed on CPAP after being told he had severe apnea. He discontinued using CPAP after he felt he had no benefit from using the CPAP.  He  felt neither refreshed nor restored and continues to have trouble staying awake in meetings. This "embarrassment " caused him to look for early retirement. He has poorly controled diabetes and confessed this may cause fatigue as well. He is now retired and often can take a nap after lunch , which would have been impossible while gainfully employed . 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  He is a Therapist, nutritional, Teacher, English as a foreign language - when reading or studying a new piece, he can fall asleep!  He has gaines weight slowly over a decade. He gained 10 pounds the last 3 years .      He will usually go to sleep around 11.00 PM , he watches TV in bedroom( set on a sleeper 15 minutes )  , he falls asleep promptly.  It varied greatly how long he sleeps, due to pain form arthritis in back , knees and hips. He became a side sleeper.  He has some restless legs symptoms, witnessed PLMs.  He is a loud snorer and has apneas, witnessed by his wife, who shares the bedroom with him. The bed room  is cool, quiet and dark.  He wakes up with an alarm, needs it. He wakes up but doesn't rise , drinks a cup of coffee in the morning. Diet soda in afternoon.    He is not a smoker and not ETOH drinker.   He has no trauma or  surgeries to neck, face or airway. No tonsillectomy.       Review of Systems: Out of a complete 14 system review, the patient complains of only the following symptoms, and all other reviewed systems are negative. GDS 2 , FSS 36, Epworth 16   History   Social History  . Marital Status: Married    Spouse Name: [name]    Number of Children: 3  . Years of Education: 16   Occupational History  . Musician    Social History Main Topics  . Smoking status: Former Research scientist (life sciences)  . Smokeless tobacco: Never Used  . Alcohol Use: Yes     Comment: occasionally  . Drug Use: No  . Sexual Activity: Yes   Other Topics Concern  . Not on file   Social History Narrative   Patient is married [name]) and lives at home with his wife.   Patient has three adult children.   Patient is retired.   Patient is a Research officer, political party.   Patient is right-handed.   Patient drinks one cup of coffee daily and 2-3 sodas per day.          Family History  Problem Relation Age of Onset  . Hypertension Mother   .  Hypertension Father   . Cancer Brother     lung  . Stroke Brother   . Colon cancer Neg Hx     Past Medical History  Diagnosis Date  . Diabetes mellitus without complication   . Hypertension   . High cholesterol   . Gout   . Heart murmur   . Sleep apnea     Past Surgical History  Procedure Laterality Date  . Colonoscopy  2008    Current Outpatient Prescriptions  Medication Sig Dispense Refill  . aspirin EC 81 MG tablet Take 1 tablet (81 mg total) by mouth daily.  30 tablet  0  . colchicine 0.6 MG tablet Take 1-2 tablets (0.6-1.2 mg total) by mouth daily as needed (for gout flare). 2 po at onset of flair and then repeat in 1 h for acute gout attack then 1 po qd until pain free  30 tablet  0  . felodipine (PLENDIL) 10 MG 24 hr tablet Take 1 tablet (10 mg total) by mouth daily.  90 tablet  1  . glipiZIDE (GLUCOTROL) 10 MG tablet Take 1 tablet (10 mg total) by mouth 2 (two) times daily before a  meal.  180 tablet  1  . glucose blood test strip Use as directed  100 each  3  . losartan-hydrochlorothiazide (HYZAAR) 100-12.5 MG per tablet Take 1 tablet by mouth daily.  90 tablet  1  . lovastatin (MEVACOR) 20 MG tablet Take 1 tablet (20 mg total) by mouth at bedtime.  90 tablet  1  . metFORMIN (GLUCOPHAGE) 850 MG tablet Take 1 tablet (850 mg total) by mouth 2 (two) times daily with a meal.  180 tablet  1  . nebivolol (BYSTOLIC) 10 MG tablet Take 1 tablet (10 mg total) by mouth daily.  30 tablet  5   No current facility-administered medications for this visit.    Allergies as of 02/04/2014 - Review Complete 02/04/2014  Allergen Reaction Noted  . Lisinopril Cough 08/12/2011  . Penicillins Other (See Comments) 08/12/2011  . Tramadol  02/04/2014    Vitals: BP 147/77  Pulse 66  Resp 16  Ht 5' 3.75" (1.619 m)  Wt 205 lb (92.987 kg)  BMI 35.48 kg/m2 Last Weight:  Wt Readings from Last 1 Encounters:  02/04/14 205 lb (92.987 kg)   Last Height:   Ht Readings from Last 1 Encounters:  02/04/14 5' 3.75" (1.619 m)     Physical exam:  General: The patient is awake, alert and appears not in acute distress. The patient is well groomed. Head: Normocephalic, atraumatic. Neck is supple. Mallampati 3, neck circumference: 18.5 inches. He has clear  retrognathia with overbite.  Cardiovascular:  Regular rate and rhythm, without  murmurs or carotid bruit, and without distended neck veins. Respiratory: Lungs are clear to auscultation. Skin:  Without evidence of edema, or rash Trunk: BMI is  elevated . This  patient has normal posture.  Neurologic exam : The patient is awake and alert, oriented to place and time.  Memory subjective  described as intact. There is a normal attention span & concentration ability. Speech is fluent without dysarthria, dysphonia or aphasia. Mood and affect are appropriate.  Cranial nerves: Pupils are equal and briskly reactive to light. Funduscopic exam without  evidence of pallor or edema.  He is diabetic . Extraocular movements  in vertical and horizontal planes intact and without nystagmus. Visual fields by finger perimetry are intact. Hearing to finger rub intact.   Facial sensation intact to  fine touch. Facial motor strength is symmetric and tongue and uvula move midline.  Motor exam:  Normal tone , muscle bulk and symmetric normal strength in all extremities.  Sensory:  Fine touch, pinprick and vibration were tested in all extremities.  Proprioception is normal.  Coordination: Rapid alternating movements in the fingers/hands is tested and normal.  Finger-to-nose maneuver tested and normal without evidence of ataxia, dysmetria or tremor.  Gait and station: Patient walks without assistive device . Strength within normal limits. Stance is stable and normal.   Deep tendon reflexes: in the  upper and lower extremities are symmetric and intact. Babinski maneuver response is downgoing.   Assessment:  After physical and neurologic examination, review of laboratory studies, imaging, neurophysiology testing and pre-existing records, assessment is   1) patient with a history of sleep apnea, now untreated for several years. 2) reported persistent hypersomnia and fatigue while on CPAP. 3) retrognathia, weight gain and age related changes may have caused worsening of the OSA, there is not evidence that  it could have resolved.   Plan:  Treatment plan and additional workup :  SPLIT study ASA- david please, I need a gentle titration for a " jaded " patient with some apprehension to resume CPAP therapy  Nasal pillow. He had a full face mask before .

## 2014-02-04 NOTE — Telephone Encounter (Signed)
Can change to metoprolol 50mg  BID.  Stop Bystolic.  Keep a record of blood pressures outside of the office.  If blood pressures run over 160/100 or any lightheadedness or dizziness with this dose - return to clinic to discuss different meds.

## 2014-02-05 MED ORDER — METOPROLOL TARTRATE 50 MG PO TABS: 50.0000 mg | ORAL_TABLET | Freq: Two times a day (BID) | ORAL | Status: DC

## 2014-02-05 NOTE — Telephone Encounter (Signed)
No. He should not take both.  Based on cardiology notes, it appears they wanted him to stop metoprolol and start bystolic in place of that. I also see a note from cardiology yesterday that he should be on one or the other. Bystolic OR metoprolol.  Thanks. -JG.

## 2014-02-05 NOTE — Telephone Encounter (Signed)
Pt sent an email on mychart asking Dr Meda Coffee to prescribe something in place of the Nebivolol 10 mg he was ordered to take at last OV.  Pt states the nebivolol is not covered by his insurance plan.  Informed pt that Dr Meda Coffee is out of the office until the end of the month.  Obtain new order from DOD Dr Harrington Challenger for pt to supplement in taking metoprolol tartrate 50 mg bid.  Noted in pts current med list that he's already taking this.  Asked pt if he was taking both nebivolol and metoprolol at the same time.  Pt stated he was.  Advised pt to only take one of these meds not both.  Informed pt that he can finish out the nebivolol samples that was given to him, and start taking metoprolol tartrate 50mg  bid thereafter.  Informed pt to continue logging his BP reading and sending them to [place].  Sent the metoprolol tartrate 50 mg bid order to pts pharmacy of choice.  Pt verbalized understanding and agrees with this plan.

## 2014-02-05 NOTE — Telephone Encounter (Signed)
Lm for rtn call- please page Clarise Cruz to speak to patient.

## 2014-02-05 NOTE — Telephone Encounter (Signed)
Pt is taking metoprolol succinate 100 mg, do you want him to add the lopressor along with this medication?  Please advise

## 2014-02-06 NOTE — Telephone Encounter (Signed)
Spoke to pt in detail about medications. He verbalized an understanding not to take the Bystolic and Metoprolol together. He states that he is almost finished with the sample the cardiologist had dispensed at his visit. Pt is going to be changing pharmacy to mail order. He is not sure which one. He will contact us when he has all the information.

## 2014-02-07 ENCOUNTER — Telehealth: Payer: Self-pay

## 2014-02-07 ENCOUNTER — Telehealth: Payer: Self-pay | Admitting: General Surgery

## 2014-02-07 ENCOUNTER — Telehealth: Payer: Self-pay | Admitting: Cardiology

## 2014-02-07 NOTE — Telephone Encounter (Signed)
Pt asked for a call in regards to new medication Dr Meda Coffee put him on. It is not covered by his insurance.

## 2014-02-07 NOTE — Telephone Encounter (Signed)
Antonio Wells, I think pt is returning your call.

## 2014-02-07 NOTE — Telephone Encounter (Signed)
Pt mistakenly called back about the med telephone call on 7/14.  Pt in clear understanding and received new med.

## 2014-02-07 NOTE — Telephone Encounter (Signed)
New message    Patient calling stating he returning Amy calls.

## 2014-02-07 NOTE — Telephone Encounter (Signed)
Left message with pts wife to have pt call back in regards to his medications.

## 2014-02-07 NOTE — Telephone Encounter (Signed)
Patient states his medications should all (except for his gout medication) be sent to [facility] option #3

## 2014-02-12 ENCOUNTER — Other Ambulatory Visit (INDEPENDENT_AMBULATORY_CARE_PROVIDER_SITE_OTHER): Payer: Medicare Other | Admitting: Family Medicine

## 2014-02-12 DIAGNOSIS — IMO0001 Reserved for inherently not codable concepts without codable children: Secondary | ICD-10-CM | POA: Diagnosis not present

## 2014-02-12 DIAGNOSIS — IMO0002 Reserved for concepts with insufficient information to code with codable children: Secondary | ICD-10-CM

## 2014-02-12 DIAGNOSIS — I10 Essential (primary) hypertension: Secondary | ICD-10-CM | POA: Diagnosis not present

## 2014-02-12 DIAGNOSIS — E1165 Type 2 diabetes mellitus with hyperglycemia: Secondary | ICD-10-CM

## 2014-02-12 LAB — COMPLETE METABOLIC PANEL WITH GFR
ALT: 27 U/L (ref 0–53)
AST: 16 U/L (ref 0–37)
Albumin: 4.3 g/dL (ref 3.5–5.2)
Alkaline Phosphatase: 44 U/L (ref 39–117)
BILIRUBIN TOTAL: 0.4 mg/dL (ref 0.2–1.2)
BUN: 17 mg/dL (ref 6–23)
CO2: 28 meq/L (ref 19–32)
Calcium: 9 mg/dL (ref 8.4–10.5)
Chloride: 99 mEq/L (ref 96–112)
Creat: 1.49 mg/dL — ABNORMAL HIGH (ref 0.50–1.35)
GFR, EST AFRICAN AMERICAN: 55 mL/min — AB
GFR, EST NON AFRICAN AMERICAN: 48 mL/min — AB
GLUCOSE: 210 mg/dL — AB (ref 70–99)
Potassium: 4.5 mEq/L (ref 3.5–5.3)
SODIUM: 139 meq/L (ref 135–145)
TOTAL PROTEIN: 6.6 g/dL (ref 6.0–8.3)

## 2014-02-12 LAB — LIPID PANEL
CHOLESTEROL: 142 mg/dL (ref 0–200)
HDL: 48 mg/dL (ref >39)
LDL Cholesterol: 66 mg/dL (ref 0–99)
Total CHOL/HDL Ratio: 3 Ratio
Triglycerides: 141 mg/dL (ref <150)
VLDL: 28 mg/dL (ref 0–40)

## 2014-02-13 ENCOUNTER — Other Ambulatory Visit: Payer: Self-pay | Admitting: Physician Assistant

## 2014-02-19 ENCOUNTER — Ambulatory Visit (INDEPENDENT_AMBULATORY_CARE_PROVIDER_SITE_OTHER): Payer: Medicare Other | Admitting: Family Medicine

## 2014-02-19 ENCOUNTER — Ambulatory Visit (INDEPENDENT_AMBULATORY_CARE_PROVIDER_SITE_OTHER): Payer: Medicare Other

## 2014-02-19 VITALS — BP 146/87 | HR 69 | Temp 98.0°F | Resp 18 | Ht 61.5 in | Wt 202.2 lb

## 2014-02-19 DIAGNOSIS — E1165 Type 2 diabetes mellitus with hyperglycemia: Secondary | ICD-10-CM

## 2014-02-19 DIAGNOSIS — K3189 Other diseases of stomach and duodenum: Secondary | ICD-10-CM

## 2014-02-19 DIAGNOSIS — R1319 Other dysphagia: Secondary | ICD-10-CM

## 2014-02-19 DIAGNOSIS — R1013 Epigastric pain: Secondary | ICD-10-CM | POA: Diagnosis not present

## 2014-02-19 DIAGNOSIS — IMO0001 Reserved for inherently not codable concepts without codable children: Secondary | ICD-10-CM

## 2014-02-19 DIAGNOSIS — IMO0002 Reserved for concepts with insufficient information to code with codable children: Secondary | ICD-10-CM

## 2014-02-19 DIAGNOSIS — K30 Functional dyspepsia: Secondary | ICD-10-CM

## 2014-02-19 LAB — POCT CBC
Granulocyte percent: 39.6 %G (ref 37–80)
HCT, POC: 44 % (ref 43.5–53.7)
Hemoglobin: 14.4 g/dL (ref 14.1–18.1)
Lymph, poc: 2.2 (ref 0.6–3.4)
MCH, POC: 30.8 pg (ref 27–31.2)
MCHC: 32.8 g/dL (ref 31.8–35.4)
MCV: 94 fL (ref 80–97)
MID (cbc): 0.2 (ref 0–0.9)
MPV: 8.7 fL (ref 0–99.8)
POC Granulocyte: 1.6 — AB (ref 2–6.9)
POC LYMPH PERCENT: 55.8 %L — AB (ref 10–50)
POC MID %: 4.6 %M (ref 0–12)
Platelet Count, POC: 209 10*3/uL (ref 142–424)
RBC: 4.67 M/uL — AB (ref 4.69–6.13)
RDW, POC: 13.9 %
WBC: 4 10*3/uL — AB (ref 4.6–10.2)

## 2014-02-19 LAB — POCT URINALYSIS DIPSTICK
Bilirubin, UA: NEGATIVE
Blood, UA: NEGATIVE
Glucose, UA: 100
Ketones, UA: NEGATIVE
Leukocytes, UA: NEGATIVE
Nitrite, UA: NEGATIVE
Protein, UA: 30
Spec Grav, UA: 1.015
Urobilinogen, UA: 0.2
pH, UA: 7

## 2014-02-19 LAB — POCT UA - MICROSCOPIC ONLY
Bacteria, U Microscopic: NEGATIVE
Casts, Ur, LPF, POC: NEGATIVE
Crystals, Ur, HPF, POC: NEGATIVE
Epithelial cells, urine per micros: NEGATIVE
Mucus, UA: NEGATIVE
Yeast, UA: NEGATIVE

## 2014-02-19 LAB — POCT SEDIMENTATION RATE: POCT SED RATE: 31 mm/h — AB (ref 0–22)

## 2014-02-19 MED ORDER — OMEPRAZOLE 40 MG PO CPDR
40.0000 mg | DELAYED_RELEASE_CAPSULE | Freq: Every day | ORAL | Status: DC
Start: 2014-02-19 — End: 2014-05-30

## 2014-02-19 MED ORDER — METOCLOPRAMIDE HCL 10 MG PO TABS: 10.0000 mg | ORAL_TABLET | Freq: Three times a day (TID) | ORAL | Status: DC

## 2014-02-19 MED ORDER — SENNOSIDES-DOCUSATE SODIUM 8.6-50 MG PO TABS: 1.0000 | ORAL_TABLET | Freq: Two times a day (BID) | ORAL | Status: DC

## 2014-02-19 MED ORDER — GI COCKTAIL ~~LOC~~
30.0000 mL | Freq: Once | ORAL | Status: AC
  Administered 2014-02-19: 30 mL via ORAL

## 2014-02-19 NOTE — Progress Notes (Addendum)
Subjective:    Patient ID: Antonio Wells, male    DOB: July 18, 1946, 68 y.o.   MRN: [id number] Chief Complaint  Patient presents with  . Abdominal Pain    x over 1 week  . Constipation    HPI  2 wks ago pt saw ortho who did aspiration and injection of rt knee Violent hiccups and throwing up due to the tramadol - only took pill about 2 wks ago and the below sxs started 1d later. Know having bilaterally lower chest/abd pain every night with some dysphagia type sxs - feels like it is getting stuck in his lower esophagus No h/o bowel prob - usually has 3-4 BM daily but now bowels have decreased dramatically - still daily but with straining. Not taking any otc medications other than asa and alka-seltzer which does provide some relief.  Wife told him that he was breathing short when laying on his stomach but improves when lays to side or back.  Has not been eating as much as he is feeling more full quicker then later will have the lower abd discomfort - like spikes of pain.  Worse when taking a deep breath so that triggers the breathing shallow.  No melena or blood, no nausea, nml GU and no sxs w/ liquids.  At first thought he had a stomach flu - has the same amount of problems with all types of food.  Was thinking about trying some castor oil. No recent antibiotics, no h/o gerd or treatment of.  cbgs have been ok - below 200 - about 190s-200s in June.  Pt reports he had a cardiac stress test last wk and all was normal.  Past Medical History  Diagnosis Date  . Diabetes mellitus without complication   . Hypertension   . High cholesterol   . Gout   . Heart murmur   . Sleep apnea    Current Outpatient Prescriptions on File Prior to Visit  Medication Sig Dispense Refill  . aspirin EC 81 MG tablet Take 1 tablet (81 mg total) by mouth daily.  30 tablet  0  . colchicine 0.6 MG tablet Take 1-2 tablets (0.6-1.2 mg total) by mouth daily as needed (for gout flare). 2 po at onset of flair  and then repeat in 1 h for acute gout attack then 1 po qd until pain free  30 tablet  0  . felodipine (PLENDIL) 10 MG 24 hr tablet TAKE 1 TABLET BY MOUTH DAILY  90 tablet  1  . glipiZIDE (GLUCOTROL) 10 MG tablet TAKE 1 TABLET BY MOUTH TWICE DAILY BEFORE A MEAL  180 tablet  1  . glucose blood test strip Use as directed  100 each  3  . losartan-hydrochlorothiazide (HYZAAR) 100-12.5 MG per tablet Take 1 tablet by mouth daily.  90 tablet  1  . lovastatin (MEVACOR) 20 MG tablet TAKE 1 TABLET BY MOUTH AT BEDTIME  90 tablet  1  . metFORMIN (GLUCOPHAGE) 850 MG tablet Take 1 tablet (850 mg total) by mouth 2 (two) times daily with a meal.  180 tablet  1  . metoprolol (LOPRESSOR) 50 MG tablet Take 1 tablet (50 mg total) by mouth 2 (two) times daily.  180 tablet  6   No current facility-administered medications on file prior to visit.   Allergies  Allergen Reactions  . Lisinopril Cough  . Penicillins Other (See Comments)    Passed out  . Tramadol     Hiccups and vomiting  Review of Systems  Constitutional: Positive for appetite change. Negative for fever, chills, diaphoresis, activity change and unexpected weight change.  HENT: Positive for trouble swallowing. Negative for postnasal drip, sore throat and voice change.   Respiratory: Positive for shortness of breath. Negative for wheezing.   Cardiovascular: Negative for chest pain, palpitations and leg swelling.  Gastrointestinal: Positive for abdominal pain and constipation. Negative for nausea, vomiting, diarrhea, blood in stool, abdominal distention, anal bleeding and rectal pain.  Genitourinary: Negative for dysuria, frequency and decreased urine volume.  Skin: Negative for rash.  Hematological: Negative for adenopathy.  Psychiatric/Behavioral: Negative for sleep disturbance.     Objective:  BP 146/87  Pulse 69  Temp(Src) 98 F (36.7 C) (Oral)  Resp 18  Ht 5' 1.5" (1.562 m)  Wt 202 lb 3.2 oz (91.717 kg)  BMI 37.59 kg/m2  SpO2  96%  Physical Exam  Constitutional: He appears well-developed and well-nourished. No distress.  HENT:  Head: Normocephalic and atraumatic.  Neck: Normal range of motion. Neck supple. No thyromegaly present.  Cardiovascular: Normal rate, regular rhythm and S2 normal.   Murmur heard.  Systolic murmur is present with a grade of 3/6  Pulmonary/Chest: Effort normal and breath sounds normal.  Abdominal: Soft. Normal appearance and bowel sounds are normal. He exhibits distension. He exhibits no mass. There is tenderness in the epigastric area and left upper quadrant. There is no rigidity, no rebound, no guarding and no CVA tenderness. No hernia.  Genitourinary: Rectum normal and prostate normal. Rectal exam shows no tenderness and anal tone normal. Guaiac negative stool.  Lymphadenopathy:    He has no cervical adenopathy.  Skin: He is not diaphoretic.   Results for orders placed in visit on 02/19/14  POCT CBC      Result Value Ref Range   WBC 4.0 (*) 4.6 - 10.2 K/uL   Lymph, poc 2.2  0.6 - 3.4   POC LYMPH PERCENT 55.8 (*) 10 - 50 %L   MID (cbc) 0.2  0 - 0.9   POC MID % 4.6  0 - 12 %M   POC Granulocyte 1.6 (*) 2 - 6.9   Granulocyte percent 39.6  37 - 80 %G   RBC 4.67 (*) 4.69 - 6.13 M/uL   Hemoglobin 14.4  14.1 - 18.1 g/dL   HCT, POC 44.0  43.5 - 53.7 %   MCV 94.0  80 - 97 fL   MCH, POC 30.8  27 - 31.2 pg   MCHC 32.8  31.8 - 35.4 g/dL   RDW, POC 13.9     Platelet Count, POC 209  142 - 424 K/uL   MPV 8.7  0 - 99.8 fL  POCT SEDIMENTATION RATE      Result Value Ref Range   POCT SED RATE 31 (*) 0 - 22 mm/hr  POCT URINALYSIS DIPSTICK      Result Value Ref Range   Color, UA yellow     Clarity, UA clear     Glucose, UA 100     Bilirubin, UA neg     Ketones, UA neg     Spec Grav, UA 1.015     Blood, UA neg     pH, UA 7.0     Protein, UA 30     Urobilinogen, UA 0.2     Nitrite, UA neg     Leukocytes, UA Negative    POCT UA - MICROSCOPIC ONLY      Result Value Ref Range   WBC,  Ur,  HPF, POC 0-1     RBC, urine, microscopic 0-1     Bacteria, U Microscopic neg     Mucus, UA neg     Epithelial cells, urine per micros neg     Crystals, Ur, HPF, POC neg     Casts, Ur, LPF, POC neg     Yeast, UA neg      [facility] reading (PRIMARY) by  Dr. Brigitte Pulse. Acute abd series: No acute abnormality. Moderate constipation, no air fluid levels.    Assessment & Plan:   Diabetes type 2, uncontrolled - Plan: DG Abd Acute W/Chest, POCT CBC, POCT SEDIMENTATION RATE, POCT urinalysis dipstick, POCT UA - Microscopic Only, Comprehensive metabolic panel, Lipase  Abdominal pain, epigastric - Plan: DG Abd Acute W/Chest, POCT CBC, POCT SEDIMENTATION RATE, POCT urinalysis dipstick, POCT UA - Microscopic Only, Comprehensive metabolic panel, Lipase, gi cocktail (Maalox,Lidocaine,Donnatal)  Indigestion - Plan: DG Abd Acute W/Chest, POCT CBC, POCT SEDIMENTATION RATE, POCT urinalysis dipstick, POCT UA - Microscopic Only, Comprehensive metabolic panel, Lipase  Other dysphagia - Plan: DG Abd Acute W/Chest, POCT CBC, POCT SEDIMENTATION RATE, POCT urinalysis dipstick, POCT UA - Microscopic Only, Comprehensive metabolic panel, Lipase, gi cocktail (Maalox,Lidocaine,Donnatal)  Unknown etiology of epigastric dysphagia type sxs - ddx inc esophagitis, hiatal hernia, ileus after severe tramadol reaction.  Rec starting below meds then will try to wean back/off the reglan asap.  Recheck in 1 wk - sooner if worsening.  If sxs persist, rec imaging and GI referral.   Recheck renal function - last wk cr was bumped from baseline of 1.3 to 1.5 - if increased further may have to hold metformin (and if sig worsened acei/hctz) until improved.     Meds ordered this encounter  Medications  . gi cocktail (Maalox,Lidocaine,Donnatal)    Sig:   . senna-docusate (SENOKOT-S) 8.6-50 MG per tablet    Sig: Take 1 tablet by mouth 2 (two) times daily.    Dispense:  30 tablet    Refill:  0  . metoCLOPramide (REGLAN) 10 MG tablet    Sig:  Take 1 tablet (10 mg total) by mouth 4 (four) times daily -  before meals and at bedtime.    Dispense:  60 tablet    Refill:  0  . omeprazole (PRILOSEC) 40 MG capsule    Sig: Take 1 capsule (40 mg total) by mouth daily.    Dispense:  30 capsule    Refill:  1    Delman Cheadle, MD MPH

## 2014-02-20 ENCOUNTER — Encounter (HOSPITAL_COMMUNITY): Payer: Self-pay | Admitting: Emergency Medicine

## 2014-02-20 ENCOUNTER — Emergency Department (HOSPITAL_COMMUNITY): Payer: Medicare Other

## 2014-02-20 ENCOUNTER — Emergency Department (HOSPITAL_COMMUNITY)
Admission: EM | Admit: 2014-02-20 | Discharge: 2014-02-20 | Disposition: A | Discharge status: Home / Self Care | Payer: Medicare Other | Attending: Emergency Medicine | Admitting: Emergency Medicine

## 2014-02-20 DIAGNOSIS — Z880 Allergy status to penicillin: Secondary | ICD-10-CM | POA: Diagnosis not present

## 2014-02-20 DIAGNOSIS — K859 Acute pancreatitis without necrosis or infection, unspecified: Secondary | ICD-10-CM | POA: Insufficient documentation

## 2014-02-20 DIAGNOSIS — Z9889 Other specified postprocedural states: Secondary | ICD-10-CM | POA: Insufficient documentation

## 2014-02-20 DIAGNOSIS — R1013 Epigastric pain: Secondary | ICD-10-CM | POA: Insufficient documentation

## 2014-02-20 DIAGNOSIS — E7800 Pure hypercholesterolemia, unspecified: Secondary | ICD-10-CM | POA: Insufficient documentation

## 2014-02-20 DIAGNOSIS — Z79899 Other long term (current) drug therapy: Secondary | ICD-10-CM | POA: Diagnosis not present

## 2014-02-20 DIAGNOSIS — I10 Essential (primary) hypertension: Secondary | ICD-10-CM | POA: Insufficient documentation

## 2014-02-20 DIAGNOSIS — Z87891 Personal history of nicotine dependence: Secondary | ICD-10-CM | POA: Diagnosis not present

## 2014-02-20 DIAGNOSIS — E119 Type 2 diabetes mellitus without complications: Secondary | ICD-10-CM | POA: Diagnosis not present

## 2014-02-20 DIAGNOSIS — Z7982 Long term (current) use of aspirin: Secondary | ICD-10-CM | POA: Insufficient documentation

## 2014-02-20 DIAGNOSIS — K858 Other acute pancreatitis without necrosis or infection: Secondary | ICD-10-CM

## 2014-02-20 DIAGNOSIS — K7689 Other specified diseases of liver: Secondary | ICD-10-CM | POA: Diagnosis not present

## 2014-02-20 DIAGNOSIS — R011 Cardiac murmur, unspecified: Secondary | ICD-10-CM | POA: Diagnosis not present

## 2014-02-20 LAB — COMPREHENSIVE METABOLIC PANEL
ALK PHOS: 55 U/L (ref 39–117)
ALT: 26 U/L (ref 0–53)
ALT: 26 U/L (ref 0–53)
ANION GAP: 14 (ref 5–15)
AST: 15 U/L (ref 0–37)
AST: 18 U/L (ref 0–37)
Albumin: 4.6 g/dL (ref 3.5–5.2)
Albumin: 4.4 g/dL (ref 3.5–5.2)
Alkaline Phosphatase: 53 U/L (ref 39–117)
BILIRUBIN TOTAL: 0.5 mg/dL (ref 0.3–1.2)
BUN: 17 mg/dL (ref 6–23)
BUN: 17 mg/dL (ref 6–23)
CALCIUM: 9.3 mg/dL (ref 8.4–10.5)
CHLORIDE: 98 meq/L (ref 96–112)
CO2: 29 mEq/L (ref 19–32)
CO2: 28 meq/L (ref 19–32)
Calcium: 8.9 mg/dL (ref 8.4–10.5)
Chloride: 98 mEq/L (ref 96–112)
Creat: 1.24 mg/dL (ref 0.50–1.35)
Creatinine, Ser: 1.36 mg/dL — ABNORMAL HIGH (ref 0.50–1.35)
GFR calc Af Amer: 61 mL/min — ABNORMAL LOW (ref >90)
GFR, EST NON AFRICAN AMERICAN: 52 mL/min — AB (ref >90)
GLUCOSE: 162 mg/dL — AB (ref 70–99)
Glucose, Bld: 220 mg/dL — ABNORMAL HIGH (ref 70–99)
POTASSIUM: 4.3 meq/L (ref 3.7–5.3)
Potassium: 4.4 mEq/L (ref 3.5–5.3)
SODIUM: 140 meq/L (ref 137–147)
Sodium: 137 mEq/L (ref 135–145)
Total Bilirubin: 0.5 mg/dL (ref 0.2–1.2)
Total Protein: 7.4 g/dL (ref 6.0–8.3)
Total Protein: 7.3 g/dL (ref 6.0–8.3)

## 2014-02-20 LAB — CBC WITH DIFFERENTIAL/PLATELET
Basophils Absolute: 0 10*3/uL (ref 0.0–0.1)
Basophils Relative: 0 % (ref 0–1)
Eosinophils Absolute: 0.1 10*3/uL (ref 0.0–0.7)
Eosinophils Relative: 2 % (ref 0–5)
HCT: 39 % (ref 39.0–52.0)
HEMOGLOBIN: 13.3 g/dL (ref 13.0–17.0)
LYMPHS PCT: 54 % — AB (ref 12–46)
Lymphs Abs: 1.8 10*3/uL (ref 0.7–4.0)
MCH: 31.1 pg (ref 26.0–34.0)
MCHC: 34.1 g/dL (ref 30.0–36.0)
MCV: 91.1 fL (ref 78.0–100.0)
MONOS PCT: 7 % (ref 3–12)
Monocytes Absolute: 0.2 10*3/uL (ref 0.1–1.0)
NEUTROS PCT: 37 % — AB (ref 43–77)
Neutro Abs: 1.2 10*3/uL — ABNORMAL LOW (ref 1.7–7.7)
PLATELETS: 180 10*3/uL (ref 150–400)
RBC: 4.28 MIL/uL (ref 4.22–5.81)
RDW: 13.4 % (ref 11.5–15.5)
WBC: 3.3 10*3/uL — ABNORMAL LOW (ref 4.0–10.5)

## 2014-02-20 LAB — URINALYSIS, ROUTINE W REFLEX MICROSCOPIC
BILIRUBIN URINE: NEGATIVE
GLUCOSE, UA: NEGATIVE mg/dL
HGB URINE DIPSTICK: NEGATIVE
KETONES UR: NEGATIVE mg/dL
Leukocytes, UA: NEGATIVE
Nitrite: NEGATIVE
PROTEIN: NEGATIVE mg/dL
Specific Gravity, Urine: 1.016 (ref 1.005–1.030)
UROBILINOGEN UA: 0.2 mg/dL (ref 0.0–1.0)
pH: 6.5 (ref 5.0–8.0)

## 2014-02-20 LAB — LIPASE, BLOOD: Lipase: 167 U/L — ABNORMAL HIGH (ref 11–59)

## 2014-02-20 LAB — LIPASE: Lipase: 208 U/L — ABNORMAL HIGH (ref 0–75)

## 2014-02-20 MED ORDER — HYDROCODONE-ACETAMINOPHEN 5-325 MG PO TABS: 1.0000 | ORAL_TABLET | ORAL | Status: DC | PRN

## 2014-02-20 NOTE — ED Provider Notes (Signed)
CSN: [id number]     Arrival date & time 02/20/14  1013 History   First MD Initiated Contact with Patient 02/20/14 1216     Chief Complaint  Patient presents with  . Abdominal Pain     (Consider location/radiation/quality/duration/timing/severity/associated sxs/prior Treatment) HPI Comments: Patient is a 68 year old male with history of diabetes and high cholesterol. He presents for evaluation of epigastric discomfort and elevated lipase. He saw his primary care doctor yesterday and had laboratory studies obtained. This revealed an elevated lipase. He was called this morning and told to come here for further evaluation. He tells me his symptoms are improving somewhat. He denies any fevers or chills. He denies any vomiting or diarrhea. He does state that his discomfort is worse with eating. He denies regular alcohol use denies history of previous gallstones.  Patient is a 68 y.o. male presenting with abdominal pain. The history is provided by the patient.  Abdominal Pain Pain location:  Epigastric Pain quality: cramping   Pain radiates to:  Does not radiate Pain severity:  Mild Onset quality:  Sudden Duration:  2 days Timing:  Constant Progression:  Partially resolved Chronicity:  New Context: not alcohol use and not diet changes   Relieved by:  Nothing Worsened by:  Eating Ineffective treatments:  None tried   Past Medical History  Diagnosis Date  . Diabetes mellitus without complication   . Hypertension   . High cholesterol   . Gout   . Heart murmur   . Sleep apnea    Past Surgical History  Procedure Laterality Date  . Colonoscopy  2008   Family History  Problem Relation Age of Onset  . Hypertension Mother   . Hypertension Father   . Cancer Brother     lung  . Stroke Brother   . Colon cancer Neg Hx    History  Substance Use Topics  . Smoking status: Former Research scientist (life sciences)  . Smokeless tobacco: Never Used  . Alcohol Use: Yes     Comment: occasionally    Review of  Systems  Gastrointestinal: Positive for abdominal pain.  All other systems reviewed and are negative.     Allergies  Lisinopril; Penicillins; and Tramadol  Home Medications   Prior to Admission medications   Medication Sig Start Date End Date Taking? Authorizing Provider  aspirin EC 81 MG tablet Take 1 tablet (81 mg total) by mouth daily. 05/31/13  Yes Lauren Burnetta Sabin, PA-C  colchicine 0.6 MG tablet Take 1-2 tablets (0.6-1.2 mg total) by mouth daily as needed (for gout flare). 2 po at onset of flair and then repeat in 1 h for acute gout attack then 1 po qd until pain free 05/31/13  Yes Lauren Burnetta Sabin, PA-C  felodipine (PLENDIL) 10 MG 24 hr tablet Take 10 mg by mouth daily.   Yes Historical Provider, MD  glipiZIDE (GLUCOTROL) 10 MG tablet Take 10 mg by mouth daily before breakfast.   Yes Historical Provider, MD  losartan-hydrochlorothiazide (HYZAAR) 100-12.5 MG per tablet Take 1 tablet by mouth daily. 01/28/14  Yes [name], MD  lovastatin (MEVACOR) 20 MG tablet Take 20 mg by mouth at bedtime.   Yes Historical Provider, MD  metFORMIN (GLUCOPHAGE) 850 MG tablet Take 1 tablet (850 mg total) by mouth 2 (two) times daily with a meal. 01/28/14  Yes [name], MD  metoCLOPramide (REGLAN) 10 MG tablet Take 1 tablet (10 mg total) by mouth 4 (four) times daily -  before meals and at bedtime. 02/19/14  Yes Shawnee Knapp, MD  metoprolol (LOPRESSOR) 100 MG tablet Take 100 mg by mouth 2 (two) times daily.   Yes Historical Provider, MD  omeprazole (PRILOSEC) 40 MG capsule Take 1 capsule (40 mg total) by mouth daily. 02/19/14  Yes Shawnee Knapp, MD  senna-docusate (SENOKOT-S) 8.6-50 MG per tablet Take 1 tablet by mouth 2 (two) times daily. 02/19/14  Yes Shawnee Knapp, MD   BP 164/75  Pulse 81  Temp(Src) 98 F (36.7 C) (Oral)  Resp 17  Ht 5\' 3"  (1.6 m)  Wt 202 lb (91.627 kg)  BMI 35.79 kg/m2  SpO2 99% Physical Exam  Nursing note and vitals reviewed. Constitutional: He is oriented to person, place,  and time. He appears well-developed and well-nourished. No distress.  HENT:  Head: Normocephalic and atraumatic.  Mouth/Throat: Oropharynx is clear and moist.  Neck: Normal range of motion. Neck supple.  Cardiovascular: Normal rate, regular rhythm and normal heart sounds.   No murmur heard. Pulmonary/Chest: Effort normal and breath sounds normal. No respiratory distress. He has no wheezes.  Abdominal: Soft. Bowel sounds are normal. He exhibits no distension. There is tenderness. There is no rebound and no guarding.  There is mild ttp in the epigastric region with rebound or guarding.  Musculoskeletal: Normal range of motion. He exhibits no edema.  Neurological: He is alert and oriented to person, place, and time.  Skin: Skin is warm and dry. He is not diaphoretic.    ED Course  Procedures (including critical care time) Labs Review Labs Reviewed  CBC WITH DIFFERENTIAL - Abnormal; Notable for the following:    WBC 3.3 (*)    Neutrophils Relative % 37 (*)    Neutro Abs 1.2 (*)    Lymphocytes Relative 54 (*)    All other components within normal limits  COMPREHENSIVE METABOLIC PANEL - Abnormal; Notable for the following:    Glucose, Bld 220 (*)    Creatinine, Ser 1.36 (*)    GFR calc non Af Amer 52 (*)    GFR calc Af Amer 61 (*)    All other components within normal limits  LIPASE, BLOOD - Abnormal; Notable for the following:    Lipase 167 (*)    All other components within normal limits  URINALYSIS, ROUTINE W REFLEX MICROSCOPIC    Imaging Review Dg Abd Acute W/chest  02/19/2014   CLINICAL DATA:  Abdominal pain and constipation.  EXAM: ACUTE ABDOMEN SERIES (ABDOMEN 2 VIEW & CHEST 1 VIEW)  COMPARISON:  Chest x-ray 07/02/2013  FINDINGS: Lungs are somewhat hypoinflated without consolidation or effusion. There is mild stable vascular crowding in the right infrahilar region. Cardiomediastinal silhouette and remainder of the chest is unchanged.  Bowel gas pattern is nonobstructive with  mild fecal retention within the colon. There are a few air-filled nondilated small bowel loops. No free peritoneal air. No pathologic calcifications. Minimal degenerative change of the spine and hips.  IMPRESSION: Nonobstructive bowel gas pattern with mild fecal retention over the colon.  No acute cardiopulmonary disease.   Electronically Signed   By: Marin Olp M.D.   On: 02/19/2014 17:00     EKG Interpretation None      MDM   Final diagnoses:  None    Patient sent here for evaluation of epigastric pain and elevated lipase. He appears clinically well and in no distress. He is afebrile and is not tachycardic. Ultrasound reveals what appears to be pancreatitis, however no evidence for biliary obstruction or gallstones. I suspect and  idiopathic etiology as the patient does not consume alcohol. He'll be recommended to adhere to a clear liquid diet for the next 48 hours, then slowly advance his diet to normal. He is to followup with his doctor in the next week and return to the ER if he develops worsening pain, high fever, vomiting, or other new and concerning symptoms.    Veryl Speak, MD 02/20/14 [phone]

## 2014-02-20 NOTE — Discharge Instructions (Signed)
Hydrocodone as prescribed as needed for pain.  Return to the emergency department if you develop worsening pain, high fever, vomiting, or other new and concerning symptoms.   Acute Pancreatitis Acute pancreatitis is a disease in which the pancreas becomes suddenly inflamed. The pancreas is a large gland located behind your stomach. The pancreas produces enzymes that help digest food. The pancreas also releases the hormones glucagon and insulin that help regulate blood sugar. Damage to the pancreas occurs when the digestive enzymes from the pancreas are activated and begin attacking the pancreas before being released into the intestine. Most acute attacks last a couple of days and can cause serious complications. Some people become dehydrated and develop low blood pressure. In severe cases, bleeding into the pancreas can lead to shock and can be life-threatening. The lungs, heart, and kidneys may fail. CAUSES  Pancreatitis can happen to anyone. In some cases, the cause is unknown. Most cases are caused by:  Alcohol abuse.  Gallstones. Other less common causes are:  Certain medicines.  Exposure to certain chemicals.  Infection.  Damage caused by an accident (trauma).  Abdominal surgery. SYMPTOMS   Pain in the upper abdomen that may radiate to the back.  Tenderness and swelling of the abdomen.  Nausea and vomiting. DIAGNOSIS  Your caregiver will perform a physical exam. Blood and stool tests may be done to confirm the diagnosis. Imaging tests may also be done, such as X-rays, CT scans, or an ultrasound of the abdomen. TREATMENT  Treatment usually requires a stay in the hospital. Treatment may include:  Pain medicine.  Fluid replacement through an intravenous line (IV).  Placing a tube in the stomach to remove stomach contents and control vomiting.  Not eating for 3 or 4 days. This gives your pancreas a rest, because enzymes are not being produced that can cause further  damage.  Antibiotic medicines if your condition is caused by an infection.  Surgery of the pancreas or gallbladder. HOME CARE INSTRUCTIONS   Follow the diet advised by your caregiver. This may involve avoiding alcohol and decreasing the amount of fat in your diet.  Eat smaller, more frequent meals. This reduces the amount of digestive juices the pancreas produces.  Drink enough fluids to keep your urine clear or pale yellow.  Only take over-the-counter or prescription medicines as directed by your caregiver.  Avoid drinking alcohol if it caused your condition.  Do not smoke.  Get plenty of rest.  Check your blood sugar at home as directed by your caregiver.  Keep all follow-up appointments as directed by your caregiver. SEEK MEDICAL CARE IF:   You do not recover as quickly as expected.  You develop new or worsening symptoms.  You have persistent pain, weakness, or nausea.  You recover and then have another episode of pain. SEEK IMMEDIATE MEDICAL CARE IF:   You are unable to eat or keep fluids down.  Your pain becomes severe.  You have a fever or persistent symptoms for more than 2 to 3 days.  You have a fever and your symptoms suddenly get worse.  Your skin or the white part of your eyes turn yellow (jaundice).  You develop vomiting.  You feel dizzy, or you faint.  Your blood sugar is high (over 300 mg/dL). MAKE SURE YOU:   Understand these instructions.  Will watch your condition.  Will get help right away if you are not doing well or get worse. Document Released: 07/12/2005 Document Revised: 01/11/2012 Document Reviewed: 10/21/2011 ExitCare  Patient Information 2015 [place]. This information is not intended to replace advice given to you by your health care provider. Make sure you discuss any questions you have with your health care provider.

## 2014-02-20 NOTE — ED Notes (Signed)
Pt c/o abd pain, sts he went to PCP yesterday and then this morning was called by them and told he could have pancreatitis and to go straight to the ED. Pt reports he hasn't eaten or taken any medications today. Pt sts 2 weeks ago he took tramadol for his knee pain and had a very bad reaction to the medication, sts his abd pain has been going on ever since then. Reports the pain is worse at bedtime, sts he doesn't think the pain worsens directly after eating. Denies n/v/d. Nad, skin warm and dry, resp e/u.

## 2014-02-21 ENCOUNTER — Encounter: Payer: Self-pay | Admitting: Family Medicine

## 2014-02-21 DIAGNOSIS — M1710 Unilateral primary osteoarthritis, unspecified knee: Secondary | ICD-10-CM | POA: Diagnosis not present

## 2014-02-21 DIAGNOSIS — IMO0002 Reserved for concepts with insufficient information to code with codable children: Secondary | ICD-10-CM

## 2014-02-21 DIAGNOSIS — E1165 Type 2 diabetes mellitus with hyperglycemia: Secondary | ICD-10-CM

## 2014-02-22 ENCOUNTER — Telehealth: Payer: Self-pay | Admitting: *Deleted

## 2014-02-22 NOTE — Telephone Encounter (Signed)
Antonio Spark, MD [facility] Triage            Could you please prescribe this patient carvedilol 12.5 mg po BID and stop nebivolol 10 mg daily (his insurance doesn't cover it).  Thank you,  Ena Dawley     Left message for pt to call back to discuss medication change and where he would like RX sent to.

## 2014-02-23 ENCOUNTER — Other Ambulatory Visit: Payer: Self-pay | Admitting: *Deleted

## 2014-02-23 DIAGNOSIS — IMO0002 Reserved for concepts with insufficient information to code with codable children: Secondary | ICD-10-CM

## 2014-02-23 DIAGNOSIS — E1165 Type 2 diabetes mellitus with hyperglycemia: Secondary | ICD-10-CM

## 2014-02-23 MED ORDER — METFORMIN HCL 850 MG PO TABS: 850.0000 mg | ORAL_TABLET | Freq: Two times a day (BID) | ORAL | Status: DC

## 2014-03-04 NOTE — Telephone Encounter (Signed)
Will forward to [employer] to follow up.  Telephone notes from after this one states the patient got his medicine but the medication list still lists nebivolol and not carvedilol.

## 2014-03-11 ENCOUNTER — Encounter: Payer: Self-pay | Admitting: Family Medicine

## 2014-03-11 DIAGNOSIS — Z23 Encounter for immunization: Secondary | ICD-10-CM

## 2014-03-11 MED ORDER — ZOSTER VACCINE LIVE 19400 UNT/0.65ML ~~LOC~~ SOLR: 0.6500 mL | Freq: Once | SUBCUTANEOUS | Status: DC

## 2014-03-11 NOTE — Telephone Encounter (Signed)
Ready

## 2014-03-11 NOTE — Telephone Encounter (Signed)
Last OV 02/19/2014. Please advise.

## 2014-03-18 ENCOUNTER — Ambulatory Visit (INDEPENDENT_AMBULATORY_CARE_PROVIDER_SITE_OTHER): Payer: Medicare Other | Admitting: Neurology

## 2014-03-18 DIAGNOSIS — G4733 Obstructive sleep apnea (adult) (pediatric): Secondary | ICD-10-CM | POA: Diagnosis not present

## 2014-03-18 DIAGNOSIS — G4710 Hypersomnia, unspecified: Secondary | ICD-10-CM

## 2014-03-18 DIAGNOSIS — R0902 Hypoxemia: Secondary | ICD-10-CM

## 2014-03-20 LAB — HM DIABETES EYE EXAM

## 2014-04-08 ENCOUNTER — Encounter: Payer: Self-pay | Admitting: Neurology

## 2014-04-08 ENCOUNTER — Telehealth: Payer: Self-pay | Admitting: Neurology

## 2014-04-08 ENCOUNTER — Other Ambulatory Visit: Payer: Self-pay | Admitting: Neurology

## 2014-04-08 DIAGNOSIS — G4733 Obstructive sleep apnea (adult) (pediatric): Secondary | ICD-10-CM

## 2014-04-08 DIAGNOSIS — R0902 Hypoxemia: Secondary | ICD-10-CM

## 2014-04-08 NOTE — Telephone Encounter (Signed)
Pt was contacted and provided results of his split night study performed on 03/18/2014.  Pt was informed of the positive indicator for the presence of obstructive OSA with hypoxemia.  Pt was informed that Dr. Brett Fairy had placed an order for him to be placed on CPAP therapy.  Pt informed that the DME company would be [employer] and that a rep from their office would be in touch with him in order for him to acquire his own CPAP supplies.  Pt was informed a copy of the results would be mailed to him and a copy would be provided to Dr. [name].  Pt was understanding and in agreement with plan.

## 2014-04-17 ENCOUNTER — Encounter: Payer: Self-pay | Admitting: Family Medicine

## 2014-04-18 ENCOUNTER — Telehealth: Payer: Self-pay | Admitting: *Deleted

## 2014-04-18 NOTE — Telephone Encounter (Signed)
Called patient to advise make appointment to follow up on his diabetes to check HgAlc. The patient will call to make an appointment after he follow up with Dr Brett Fairy. He has been using a CPAP machine since his sleep study.

## 2014-05-08 ENCOUNTER — Other Ambulatory Visit: Payer: Self-pay | Admitting: Radiology

## 2014-05-08 DIAGNOSIS — I10 Essential (primary) hypertension: Secondary | ICD-10-CM

## 2014-05-08 MED ORDER — LOSARTAN POTASSIUM-HCTZ 100-12.5 MG PO TABS: 1.0000 | ORAL_TABLET | Freq: Every day | ORAL | Status: DC

## 2014-05-09 ENCOUNTER — Telehealth: Payer: Self-pay | Admitting: Family Medicine

## 2014-05-09 DIAGNOSIS — I10 Essential (primary) hypertension: Secondary | ICD-10-CM

## 2014-05-09 MED ORDER — METOPROLOL TARTRATE 100 MG PO TABS: 100.0000 mg | ORAL_TABLET | Freq: Two times a day (BID) | ORAL | Status: DC

## 2014-05-09 NOTE — Telephone Encounter (Signed)
rx request for metoprolol

## 2014-05-17 ENCOUNTER — Encounter: Payer: Self-pay | Admitting: Neurology

## 2014-05-24 ENCOUNTER — Ambulatory Visit (INDEPENDENT_AMBULATORY_CARE_PROVIDER_SITE_OTHER): Payer: Medicare Other

## 2014-05-24 ENCOUNTER — Encounter: Payer: Self-pay | Admitting: Family Medicine

## 2014-05-24 ENCOUNTER — Ambulatory Visit (INDEPENDENT_AMBULATORY_CARE_PROVIDER_SITE_OTHER): Payer: Medicare Other | Admitting: Family Medicine

## 2014-05-24 ENCOUNTER — Encounter: Payer: Self-pay | Admitting: Neurology

## 2014-05-24 VITALS — BP 136/76 | HR 98 | Temp 98.5°F | Resp 16 | Ht 63.0 in | Wt 203.0 lb

## 2014-05-24 DIAGNOSIS — E785 Hyperlipidemia, unspecified: Secondary | ICD-10-CM | POA: Diagnosis not present

## 2014-05-24 DIAGNOSIS — G4733 Obstructive sleep apnea (adult) (pediatric): Secondary | ICD-10-CM | POA: Diagnosis not present

## 2014-05-24 DIAGNOSIS — E1165 Type 2 diabetes mellitus with hyperglycemia: Secondary | ICD-10-CM

## 2014-05-24 DIAGNOSIS — R0600 Dyspnea, unspecified: Secondary | ICD-10-CM | POA: Diagnosis not present

## 2014-05-24 DIAGNOSIS — Z8719 Personal history of other diseases of the digestive system: Secondary | ICD-10-CM

## 2014-05-24 DIAGNOSIS — Z23 Encounter for immunization: Secondary | ICD-10-CM

## 2014-05-24 DIAGNOSIS — IMO0002 Reserved for concepts with insufficient information to code with codable children: Secondary | ICD-10-CM

## 2014-05-24 DIAGNOSIS — Z87891 Personal history of nicotine dependence: Secondary | ICD-10-CM

## 2014-05-24 DIAGNOSIS — I10 Essential (primary) hypertension: Secondary | ICD-10-CM | POA: Diagnosis not present

## 2014-05-24 DIAGNOSIS — E119 Type 2 diabetes mellitus without complications: Secondary | ICD-10-CM | POA: Diagnosis not present

## 2014-05-24 LAB — PULMONARY FUNCTION TEST

## 2014-05-24 LAB — POCT CBC
Granulocyte percent: 44.2 %G (ref 37–80)
HEMATOCRIT: 43 % — AB (ref 43.5–53.7)
HEMOGLOBIN: 13.4 g/dL — AB (ref 14.1–18.1)
Lymph, poc: 2.1 (ref 0.6–3.4)
MCH: 29.6 pg (ref 27–31.2)
MCHC: 31.1 g/dL — AB (ref 31.8–35.4)
MCV: 95 fL (ref 80–97)
MID (cbc): 0.3 (ref 0–0.9)
MPV: 9.4 fL (ref 0–99.8)
POC Granulocyte: 1.9 — AB (ref 2–6.9)
POC LYMPH PERCENT: 49.7 %L (ref 10–50)
POC MID %: 6.1 % (ref 0–12)
Platelet Count, POC: 223 10*3/uL (ref 142–424)
RBC: 4.53 M/uL — AB (ref 4.69–6.13)
RDW, POC: 14.5 %
WBC: 4.2 10*3/uL — AB (ref 4.6–10.2)

## 2014-05-24 LAB — POCT GLYCOSYLATED HEMOGLOBIN (HGB A1C): Hemoglobin A1C: 7.7

## 2014-05-24 MED ORDER — ZOSTER VACCINE LIVE 19400 UNT/0.65ML ~~LOC~~ SOLR: 0.6500 mL | Freq: Once | SUBCUTANEOUS | Status: DC

## 2014-05-24 MED ORDER — IPRATROPIUM-ALBUTEROL 20-100 MCG/ACT IN AERS
1.0000 | INHALATION_SPRAY | Freq: Four times a day (QID) | RESPIRATORY_TRACT | Status: DC | PRN
Start: 2014-05-24 — End: 2014-11-22

## 2014-05-24 MED ORDER — IPRATROPIUM BROMIDE 0.02 % IN SOLN: 0.5000 mg | Freq: Once | RESPIRATORY_TRACT | Status: DC

## 2014-05-24 MED ORDER — ALBUTEROL SULFATE (2.5 MG/3ML) 0.083% IN NEBU: 2.5000 mg | INHALATION_SOLUTION | Freq: Once | RESPIRATORY_TRACT | Status: DC

## 2014-05-24 NOTE — Progress Notes (Signed)
Subjective:    Patient ID: Antonio Wells, male    DOB: Feb 18, 1946, 68 y.o.   MRN: [id number]  HPI  Antonio Wells is an 68 y.o. male with pmh of DM (dx within the past 10 years), HTN, HL, pancreatitis (01/2014) presenting for 3 month diabetes follow up.  Patient is checking home blood sugars.   Home blood sugar records: BGs range between 140's and 190's fasting in September, ran out of strips so has not checked bs in October Current symptoms include: intermittent dyspnea, hypoglycemia when he skips breakfast. Patient denies foot ulcerations, increased appetite, paresthesia of the feet, polydipsia, polyuria, visual disturbances and weight loss.  Patient is checking their feet daily. Foot concerns (callous, ulcer, wound, thickened nails, toenail fungus, skin fungus, hammer toe): none. Last dilated eye exam earlier this year, no significant findings, vision unchanged, due for follow up in 2016.  Current treatments: metformin 500mg  BID, glipizide 10mg  QD. Reports GI disturbance at 850mg  of Metformin BID, denies adverse effects at current dose. Medication compliance: excellent  Current diet: non-compliant, reports that he knows he should be eating better Current exercise: limited by bilateral knee and hip pain, followed by orthopedist Known diabetic complications: recent history of pancreatitis (01/2015)   Other concerns:  OSA - started using CPAP 1 month ago. Patient is doing well with machine, not the usual mask, nasal canula, feels like he is seeing a downward trend in his blood pressure.  HTN - checking BP daily, averaging 203'T - 597'C systolic, diet and exercise as above.  Dyspnea - occurs once to twice a week for the past month, stops to rest for less than a minute, symptoms resolve thereafter. 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 Patient has an extensive smoking history, 2ppd for ~20 years, quit smoking  in 2000. Reports occasional use of alcohol. Denies chest pain, heart racing, palpitations, diaphoresis, neck pain, jaw pain, arm pain, dizziness, syncope, n/v, sense of dread.   Prior to Admission medications   Medication Sig Start Date End Date Taking? Authorizing Provider  aspirin EC 81 MG tablet Take 1 tablet (81 mg total) by mouth daily. 05/31/13  Yes Harvie Heck, PA-C  colchicine 0.6 MG tablet Take 1-2 tablets (0.6-1.2 mg total) by mouth daily as needed (for gout flare). 2 po at onset of flair and then repeat in 1 h for acute gout attack then 1 po qd until pain free 05/31/13  Yes Harvie Heck, PA-C  felodipine (PLENDIL) 10 MG 24 hr tablet Take 10 mg by mouth daily.   Yes Historical Provider, MD  glipiZIDE (GLUCOTROL) 10 MG tablet Take 10 mg by mouth daily before breakfast.   Yes Historical Provider, MD  HYDROcodone-acetaminophen (NORCO) 5-325 MG per tablet Take 1-2 tablets by mouth every 4 (four) hours as needed. 02/20/14  Yes Veryl Speak, MD  losartan-hydrochlorothiazide (HYZAAR) 100-12.5 MG per tablet Take 1 tablet by mouth daily. 05/08/14  Yes Shawnee Knapp, MD  lovastatin (MEVACOR) 20 MG tablet Take 20 mg by mouth at bedtime.   Yes Historical Provider, MD  metFORMIN (GLUCOPHAGE) 850 MG tablet Take 1 tablet (850 mg total) by mouth 2 (two) times daily with a meal. 02/23/14  Yes Mancel Bale, PA-C  metoprolol (LOPRESSOR) 100 MG tablet Take 1 tablet (100 mg total) by mouth 2 (two) times daily. 05/09/14  Yes Shawnee Knapp, MD  omeprazole (PRILOSEC) 40 MG capsule Take 1  capsule (40 mg total) by mouth daily. 02/19/14  Yes Shawnee Knapp, MD  metoCLOPramide (REGLAN) 10 MG tablet Take 1 tablet (10 mg total) by mouth 4 (four) times daily -  before meals and at bedtime. 02/19/14   Shawnee Knapp, MD  senna-docusate (SENOKOT-S) 8.6-50 MG per tablet Take 1 tablet by mouth 2 (two) times daily. 02/19/14   Shawnee Knapp, MD  zoster vaccine live, PF, (ZOSTAVAX) 03546 UNT/0.65ML injection Inject 19,400 Units into the skin once.  03/11/14   Mancel Bale, PA-C    Allergies  Allergen Reactions  . Lisinopril Cough  . Penicillins Other (See Comments)    Passed out  . Tramadol     Hiccups and vomiting    Review of Systems As in subjective.    Objective:   Physical Exam  Constitutional: He is oriented to person, place, and time. He appears well-developed and well-nourished. No distress.  BP 136/76  Pulse 98  Temp(Src) 98.5 F (36.9 C) (Oral)  Resp 16  Ht 5\' 3"  (1.6 m)  Wt 203 lb (92.08 kg)  BMI 35.97 kg/m2  SpO2 98%  BP Readings from Last 3 Encounters: 05/24/14 : 136/76 03/19/14 : 119/87 02/20/14 : 139/74  Wt Readings from Last 3 Encounters: 05/24/14 : 203 lb (92.08 kg) 02/20/14 : 202 lb (91.627 kg) 02/19/14 : 202 lb 3.2 oz (91.717 kg)   Eyes: Conjunctivae are normal. Pupils are equal, round, and reactive to light. No scleral icterus.  Neck: Normal range of motion. Neck supple. No thyromegaly present.  Cardiovascular: Normal rate, regular rhythm and intact distal pulses.  Exam reveals no gallop and no friction rub.   Murmur (systolic ejection murmur heard best in RUSB) heard. Pulmonary/Chest: Effort normal and breath sounds normal. No stridor. No respiratory distress. He has no wheezes. He has no rales. He exhibits no tenderness.  Abdominal: Soft. Bowel sounds are normal. He exhibits no distension and no mass. There is no tenderness.  Musculoskeletal: Normal range of motion. He exhibits edema (trace edema up to the calf bilaterally). He exhibits no tenderness.  Lymphadenopathy:    He has no cervical adenopathy.  Neurological: He is alert and oriented to person, place, and time.  Skin: Skin is warm and dry. No rash noted. He is not diaphoretic. No erythema.  Psychiatric: He has a normal mood and affect. His behavior is normal.    Results for orders placed in visit on 05/24/14 (from the past 24 hour(s))  POCT CBC     Status: Abnormal   Collection Time    05/24/14  3:25 PM      Result Value Ref  Range   WBC 4.2 (*) 4.6 - 10.2 K/uL   Lymph, poc 2.1  0.6 - 3.4   POC LYMPH PERCENT 49.7  10 - 50 %L   MID (cbc) 0.3  0 - 0.9   POC MID % 6.1  0 - 12 %M   POC Granulocyte 1.9 (*) 2 - 6.9   Granulocyte percent 44.2  37 - 80 %G   RBC 4.53 (*) 4.69 - 6.13 M/uL   Hemoglobin 13.4 (*) 14.1 - 18.1 g/dL   HCT, POC 43.0 (*) 43.5 - 53.7 %   MCV 95.0  80 - 97 fL   MCH, POC 29.6  27 - 31.2 pg   MCHC 31.1 (*) 31.8 - 35.4 g/dL   RDW, POC 14.5     Platelet Count, POC 223  142 - 424 K/uL   MPV 9.4  0 - 99.8 fL  POCT GLYCOSYLATED HEMOGLOBIN (HGB A1C)     Status: None   Collection Time    05/24/14  3:36 PM      Result Value Ref Range   Hemoglobin A1C 7.7     [facility] reading (PRIMARY) by  Dr. Brigitte Pulse and PA-Wilberto Console. CXR: No acute cardiopulmonary process. Mild signs of LVH. Lungs are clear bilaterally. The visualized skeletal structures are unremarkable.     Assessment & Plan:   1. Type II diabetes mellitus, uncontrolled Not at goal d/t dietary non-compliance and lack of exercise, discussed at length importance of diet and exercise, referral to nutrition for counseling; patient unable to tolerate higher doses of metformin, continue glipizide, d/t history of pancreatitis cannot try DPP-IV inhibitor or GLP-1 agonists, consider adding SGL-2 inhibitors if remains uncontrolled, follow up in 4 months  - [facility] Diabetes Foot Exam - POCT glycosylated hemoglobin (Hb A1C) - COMPLETE METABOLIC PANEL WITH GFR - Microalbumin, urine - Ambulatory referral to Nutrition and Diabetic Education  2. Need for shingles vaccine - zoster vaccine live, PF, (ZOSTAVAX) 16109 UNT/0.65ML injection; Inject 19,400 Units into the skin once.  Dispense: 1 each; Refill: 0  3. Dyspnea 4. History of smoking 10-25 pack years Likely due to pulmonary source considering PFTs today, recent cardiac work-up and extensive smoking history of abuse, advised patient to schedule visit with his Cardiologist, Rx Combivent for dyspnea and prophylactic use  prior to periods of physical activity, advised patient to seek immediate medical attention if he begins to experience chest pain or sob that does not resolve with Combivent, consider referral to Pulmonologist if symptoms fail to resolve, otherwise follow up in 4 months - DG Chest 2 View; Future - POCT CBC - EKG 12-Lead - Pulmonary Function Test; Standing - Pulmonary Function Test - DG Chest 2 View; Future - Ipratropium-Albuterol (COMBIVENT) respimat 1 puff; Inhale 1 puff into the lungs every 6 (six) hours as needed for wheezing or shortness of breath (use before becoming physically active or during periods of shortness of breath). - albuterol (PROVENTIL) (2.5 MG/3ML) 0.083% nebulizer solution 2.5 mg; Take 3 mLs (2.5 mg total) by nebulization once. - ipratropium (ATROVENT) nebulizer solution 0.5 mg; Take 2.5 mLs (0.5 mg total) by nebulization once.  5. History of pancreatitis Repeat labs today, monitor - Lipase  6. Hyperlipidemia Stable, repeat labs, follow up as above - Lipid panel  7. HTN Stable, continue current regimen, repeat labs, follow up as above   Jaynee Eagles, PA-C [facility] [phone] 05/24/2014 5:25 PM

## 2014-05-24 NOTE — Progress Notes (Signed)
EKG: NSR, no ischemic change PFTs: FVC 73%, FEV1 79% FEV1/FVC 81% After duoneb:  FVC 59%, FEV1 68% FEV1/FVC 85% [facility] reading (PRIMARY) by  Dr. Brigitte Pulse. CXR: no acute abnormality  Type II diabetes mellitus, uncontrolled - Plan: [facility] Diabetes Foot Exam, POCT glycosylated hemoglobin (Hb A1C), COMPLETE METABOLIC PANEL WITH GFR, Microalbumin, urine, Ambulatory referral to Nutrition and Diabetic Education - improving some - going to work on tlc and recheck at f/u - will need new med if a1c not at goal at f/u as cannot tolerate higher metformin dose - consider SGL-2 inh prn at f/u  Dyspnea - Plan: POCT CBC, EKG 12-Lead, Pulmonary Function Test, Pulmonary Function Test, DG Chest 2 View, Ipratropium-Albuterol (COMBIVENT) respimat 1 puff, albuterol (PROVENTIL) (2.5 MG/3ML) 0.083% nebulizer solution 2.5 mg, ipratropium (ATROVENT) nebulizer solution 0.5 mg - unknown etiology as PFTs wnml - try prn combivent when he has sxs, if this persists, consider pulmonary referral.  History of pancreatitis - Plan: Lipase, Pulmonary Function Test, Pulmonary Function Test - resolved after outpt management of initial episode.  Hyperlipidemia - Plan: Lipid panel  Essential hypertension  History of smoking 10-25 pack years - Plan: DG Chest 2 View  Need for shingles vaccine - Plan: zoster vaccine live, PF, (ZOSTAVAX) 07622 UNT/0.65ML injection, DISCONTINUED: zoster vaccine live, PF, (ZOSTAVAX) 63335 UNT/0.65ML injection  Meds ordered this encounter  Medications  . DISCONTD: zoster vaccine live, PF, (ZOSTAVAX) 45625 UNT/0.65ML injection    Sig: Inject 19,400 Units into the skin once.    Dispense:  1 each    Refill:  0  . zoster vaccine live, PF, (ZOSTAVAX) 63893 UNT/0.65ML injection    Sig: Inject 19,400 Units into the skin once.    Dispense:  1 each    Refill:  0    Order Specific Question:  Supervising Provider    Answer:  Anelly Samarin N [[id number]]  . Ipratropium-Albuterol (COMBIVENT) respimat 1 puff    Sig:   . albuterol  (PROVENTIL) (2.5 MG/3ML) 0.083% nebulizer solution 2.5 mg    Sig:   . ipratropium (ATROVENT) nebulizer solution 0.5 mg    Sig:    Pt assessed, reviewed xray and documentation and agree w/ assessment and plan. Delman Cheadle, MD MPH

## 2014-05-25 LAB — COMPLETE METABOLIC PANEL WITH GFR
ALT: 29 U/L (ref 0–53)
AST: 20 U/L (ref 0–37)
Albumin: 4.7 g/dL (ref 3.5–5.2)
Alkaline Phosphatase: 45 U/L (ref 39–117)
BUN: 17 mg/dL (ref 6–23)
CALCIUM: 8.9 mg/dL (ref 8.4–10.5)
CHLORIDE: 101 meq/L (ref 96–112)
CO2: 27 meq/L (ref 19–32)
Creat: 1.31 mg/dL (ref 0.50–1.35)
GFR, EST AFRICAN AMERICAN: 65 mL/min
GFR, Est Non African American: 56 mL/min — ABNORMAL LOW
Glucose, Bld: 151 mg/dL — ABNORMAL HIGH (ref 70–99)
POTASSIUM: 4.1 meq/L (ref 3.5–5.3)
Sodium: 141 mEq/L (ref 135–145)
Total Bilirubin: 0.4 mg/dL (ref 0.2–1.2)
Total Protein: 7 g/dL (ref 6.0–8.3)

## 2014-05-25 LAB — LIPID PANEL
Cholesterol: 157 mg/dL (ref 0–200)
HDL: 46 mg/dL (ref >39)
LDL Cholesterol: 87 mg/dL (ref 0–99)
Total CHOL/HDL Ratio: 3.4 Ratio
Triglycerides: 122 mg/dL (ref <150)
VLDL: 24 mg/dL (ref 0–40)

## 2014-05-25 LAB — LIPASE: Lipase: 23 U/L (ref 0–75)

## 2014-05-25 LAB — MICROALBUMIN, URINE: Microalb, Ur: 7.3 mg/dL — ABNORMAL HIGH (ref <2.0)

## 2014-05-29 ENCOUNTER — Telehealth: Payer: Self-pay

## 2014-05-29 ENCOUNTER — Encounter: Payer: Self-pay | Admitting: Urgent Care

## 2014-05-29 ENCOUNTER — Ambulatory Visit: Payer: Medicare Other | Admitting: Neurology

## 2014-05-29 NOTE — Telephone Encounter (Signed)
Pt states someone called him and he think it was Dr. Raul Del asst. Please call [phone], wasn't sure what it was about

## 2014-05-30 ENCOUNTER — Ambulatory Visit (INDEPENDENT_AMBULATORY_CARE_PROVIDER_SITE_OTHER): Payer: Medicare Other | Admitting: Neurology

## 2014-05-30 ENCOUNTER — Encounter: Payer: Self-pay | Admitting: Neurology

## 2014-05-30 VITALS — BP 122/66 | HR 68 | Temp 98.4°F | Resp 14 | Ht 63.5 in | Wt 207.0 lb

## 2014-05-30 DIAGNOSIS — G4710 Hypersomnia, unspecified: Secondary | ICD-10-CM | POA: Diagnosis not present

## 2014-05-30 DIAGNOSIS — G4733 Obstructive sleep apnea (adult) (pediatric): Secondary | ICD-10-CM | POA: Diagnosis not present

## 2014-05-30 DIAGNOSIS — G4730 Sleep apnea, unspecified: Secondary | ICD-10-CM | POA: Diagnosis not present

## 2014-05-30 DIAGNOSIS — Z9989 Dependence on other enabling machines and devices: Secondary | ICD-10-CM

## 2014-05-30 MED ORDER — ARMODAFINIL 150 MG PO TABS: 150.0000 mg | ORAL_TABLET | Freq: Every day | ORAL | Status: DC

## 2014-05-30 NOTE — Patient Instructions (Signed)
Modafinil tablets  What is this medicine?  MODAFINIL (moe DAF i nil) is used to treat excessive sleepiness caused by certain sleep disorders. This includes narcolepsy, sleep apnea, and shift work sleep disorder.  This medicine may be used for other purposes; ask your health care provider or pharmacist if you have questions.  COMMON BRAND NAME(S): Provigil  What should I tell my health care provider before I take this medicine?  They need to know if you have any of these conditions:  -history of depression, mania, or other mental disorder  -kidney disease  -liver disease  -an unusual or allergic reaction to modafinil, other medicines, foods, dyes, or preservatives  -pregnant or trying to get pregnant  -breast-feeding  How should I use this medicine?  Take this medicine by mouth with a glass of water. Follow the directions on the prescription label. Take your doses at regular intervals. Do not take your medicine more often than directed. Do not stop taking except on your doctor's advice.  A special MedGuide will be given to you by the pharmacist with each prescription and refill. Be sure to read this information carefully each time.  Talk to your pediatrician regarding the use of this medicine in children. This medicine is not approved for use in children.  Overdosage: If you think you have taken too much of this medicine contact a poison control center or emergency room at once.  NOTE: This medicine is only for you. Do not share this medicine with others.  What if I miss a dose?  If you miss a dose, take it as soon as you can. If it is almost time for your next dose, take only that dose. Do not take double or extra doses.  What may interact with this medicine?  Do not take this medicine with any of the following medications:  -amphetamine or dextroamphetamine  -dexmethylphenidate or methylphenidate  -medicines called MAO Inhibitors like Nardil, Parnate, Marplan, Eldepryl  -pemoline  -procarbazine  This medicine may  also interact with the following medications:  -antifungal medicines like itraconazole or ketoconazole  -barbiturates like phenobarbital  -birth control pills or other hormone-containing birth control devices or implants  -carbamazepine  -cyclosporine  -diazepam  -medicines for depression, anxiety, or psychotic disturbances  -phenytoin  -propranolol  -triazolam  -warfarin  This list may not describe all possible interactions. Give your health care provider a list of all the medicines, herbs, non-prescription drugs, or dietary supplements you use. Also tell them if you smoke, drink alcohol, or use illegal drugs. Some items may interact with your medicine.  What should I watch for while using this medicine?  Visit your doctor or health care professional for regular checks on your progress. The full effects of this medicine may not be seen right away.  This medicine may affect your concentration, function, or may hide signs that you are tired. You may get dizzy. Do not drive, use machinery, or do anything that needs mental alertness until you know how this drug affects you. Alcohol can make you more dizzy and may interfere with your response to this medicine or your alertness. Avoid alcoholic drinks.  Birth control pills may not work properly while you are taking this medicine. Talk to your doctor about using an extra method of birth control.  It is unknown if the effects of this medicine will be increased by the use of caffeine. Caffeine is available in many foods, beverages, and medications. Ask your doctor if you should limit or   swelling of the face, lips, or tongue -anxiety -breathing problems -chest pain -fast, irregular  heartbeat -hallucinations -increased blood pressure -redness, blistering, peeling or loosening of the skin, including inside the mouth -sore throat, fever, or chills -suicidal thoughts or other mood changes -tremors -vomiting Side effects that usually do not require medical attention (report to your doctor or health care professional if they continue or are bothersome): -headache -nausea, diarrhea, or stomach upset -nervousness -trouble sleeping This list may not describe all possible side effects. Call your doctor for medical advice about side effects. You may report side effects to FDA at 1-800-FDA-1088. Where should I keep my medicine? Keep out of the reach of children. This medicine can be abused. Keep your medicine in a safe place to protect it from theft. Do not share this medicine with anyone. Selling or giving away this medicine is dangerous and against the law. Store at room temperature between 20 and 25 degrees C (68 and 77 degrees F). Throw away any unused medicine after the expiration date. NOTE: This sheet is a summary. It may not cover all possible information. If you have questions about this medicine, talk to your doctor, pharmacist, or health care provider.  2015, [employer]/Gold Standard. (2009-06-24 21:07:42)  

## 2014-05-30 NOTE — Progress Notes (Signed)
[facility]  Provider:  Larey Seat, [place] D  Referring Provider: [name], MD Primary Care Physician:  Antonio Agreste, MD  Chief Complaint  Patient presents with  . RV Sleep cpap    Rm 11, alone    HPI:  Antonio Wells is a 68 y.o. male , who  is seen here as a referral  from Dr. Carlota Wells for sleep apnea evaluation,  Antonio Wells is afro-american , right handed gentleman with a known diagnosis of OSA, diagnosed in 2011. At the time he was still full time employed.  He was placed on CPAP after being told he had severe apnea. He discontinued using CPAP after he felt he had no benefit from using the CPAP.   He  felt neither refreshed nor restored and continues to have trouble staying awake in meetings. This "embarrassment " caused him to look for early retirement. He has poorly controled diabetes and confessed this may cause fatigue as well. He is now retired and often can take a nap after lunch , which would have been impossible while gainfully employed . 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  He is a Therapist, nutritional, Teacher, English as a foreign language - when reading or studying a new piece, he can fall asleep!  He has gained weight slowly, for  over a decade. He gained 10 pounds the last 3 years .      He will usually go to sleep around 11.00 PM , he watches TV in bedroom( set on a sleeper 15 minutes )  , he falls asleep promptly.  It varied greatly how long he sleeps, due to pain form arthritis in back , knees and hips. He became a side sleeper.  He has some restless legs symptoms, witnessed PLMs.  He is a loud snorer and has apneas, witnessed by his wife, who shares the bedroom with him. The bed room  is cool, quiet and dark.  He wakes up with an alarm, needs it. He wakes up but doesn't rise , drinks a cup of coffee in the morning. Diet soda in afternoon. He is not a smoker and not ETOH drinker.  He has no trauma or surgeries to  neck, face or airway. No tonsillectomy.   Interval history 05-30-14, [name] underwent a split night polysomnography on 8-20 4-15. He was diagnosed with an AHI of 58.1 and an RDI of 59.4 nearly all apneas were obstructive in nature his lowest oxygen level was 66% saturation 26.7 minutes of time he was then titrated to CPAP and the hypoxemia was much shorter 9.7 minutes but the nadir was still 75%. He did well on 11 cm water /3 cm EPR , a F and P p[place] mask in large size,  nasal model , was used. He is here today for his first follow-up visit after CPAP was initiated.  Today's compliance report for the last 30 days shows 100% compliance on days and 28 days of use a time over 4 hours at night. The was user time is 6 hours and 29 minutes the set pressure is 11 cm with 3 cm EPR the residual AHI is 1.6 and the airleak is some very modest at 7 L/m. The patient reports that he is no longer snoring this has been confirmed by his spouse.  One nocturia , some nights none. He feels refreshed in the morning , he feels still sleepy in PM. He has tried a small and large  nasal mask, but not a medium or standard size.          Review of Systems: Out of a complete 14 system review, the patient complains of only the following symptoms, and all other reviewed systems are negative.  pre CPAP ;GDS 2 , FSS 36, Epworth 16  He still endorsed the Epworth sleepiness score at 15 points, the geriatric depression score was endorsed at one point only.  History   Social History  . Marital Status: Married    Spouse Name: Antonio Wells    Number of Children: 3  . Years of Education: 16   Occupational History  . Musician    Social History Main Topics  . Smoking status: Former Smoker -- 2.00 packs/day for 15 years    Quit date: 07/26/1998  . Smokeless tobacco: Never Used  . Alcohol Use: Yes     Comment: occasionally  . Drug Use: No  . Sexual Activity: Yes   Other Topics Concern  . Not on file   Social History  Narrative   Patient is married Antonio Wells) and lives at home with his wife.   Patient has three adult children.   Patient is retired.   Patient is a Research officer, political party.   Patient is right-handed.   Patient drinks one cup of coffee daily and 2-3 sodas per day.          Family History  Problem Relation Age of Onset  . Hypertension Mother   . Hypertension Father   . Cancer Brother     lung  . Stroke Brother   . Colon cancer Neg Hx     Past Medical History  Diagnosis Date  . Diabetes mellitus without complication   . Hypertension   . High cholesterol   . Gout   . Heart murmur   . Sleep apnea     Past Surgical History  Procedure Laterality Date  . Colonoscopy  2008    Current Outpatient Prescriptions  Medication Sig Dispense Refill  . aspirin EC 81 MG tablet Take 1 tablet (81 mg total) by mouth daily. 30 tablet 0  . felodipine (PLENDIL) 10 MG 24 hr tablet Take 10 mg by mouth daily.    Marland Kitchen glipiZIDE (GLUCOTROL) 10 MG tablet Take 10 mg by mouth daily before breakfast.    . losartan-hydrochlorothiazide (HYZAAR) 100-12.5 MG per tablet Take 1 tablet by mouth daily. 90 tablet 1  . lovastatin (MEVACOR) 20 MG tablet 20 mg daily.     . metFORMIN (GLUCOPHAGE) 500 MG tablet 500 mg 2 (two) times daily with a meal.     . metoprolol (LOPRESSOR) 100 MG tablet Take 1 tablet (100 mg total) by mouth 2 (two) times daily. 90 tablet 0  . colchicine 0.6 MG tablet Take 1-2 tablets (0.6-1.2 mg total) by mouth daily as needed (for gout flare). 2 po at onset of flair and then repeat in 1 h for acute gout attack then 1 po qd until pain free 30 tablet 0  . HYDROcodone-acetaminophen (NORCO) 5-325 MG per tablet Take 1-2 tablets by mouth every 4 (four) hours as needed. 15 tablet 0  . zoster vaccine live, PF, (ZOSTAVAX) 55732 UNT/0.65ML injection Inject 19,400 Units into the skin once. 1 each 0   Current Facility-Administered Medications  Medication Dose Route Frequency Provider Last Rate Last Dose  . albuterol  (PROVENTIL) (2.5 MG/3ML) 0.083% nebulizer solution 2.5 mg  2.5 mg Nebulization Once Jaynee Eagles, PA-C      . ipratropium (ATROVENT) nebulizer solution  0.5 mg  0.5 mg Nebulization Once Jaynee Eagles, PA-C      . Ipratropium-Albuterol (COMBIVENT) respimat 1 puff  1 puff Inhalation Q6H PRN Jaynee Eagles, PA-C        Allergies as of 05/30/2014 - Review Complete 05/30/2014  Allergen Reaction Noted  . Lisinopril Cough 08/12/2011  . Penicillins Other (See Comments) 08/12/2011  . Tramadol  02/04/2014    Vitals: BP 122/66 mmHg  Pulse 68  Temp(Src) 98.4 F (36.9 C) (Oral)  Resp 14  Ht 5' 3.5" (1.613 m)  Wt 207 lb (93.895 kg)  BMI 36.09 kg/m2 Last Weight:  Wt Readings from Last 1 Encounters:  05/30/14 207 lb (93.895 kg)   Last Height:   Ht Readings from Last 1 Encounters:  05/30/14 5' 3.5" (1.613 m)     Physical exam:  General: The patient is awake, alert and appears not in acute distress. The patient is well groomed. Head: Normocephalic, atraumatic. Neck is supple. Mallampati 3, neck circumference: 18.5 inches. He has clear  retrognathia with overbite.  Cardiovascular:  Regular rate and rhythm, without  murmurs or carotid bruit, and without distended neck veins. Respiratory: Lungs are clear to auscultation. SOB reported.  Skin:  Without evidence of edema, or rash Trunk: BMI is  elevated . This  patient has normal posture.  Neurologic exam : The patient is awake and alert, oriented to place and time.  Memory subjective  described as intact. There is a normal attention span & concentration ability. Speech is fluent without dysarthria, dysphonia or aphasia. Mood and affect are appropriate.  Cranial nerves: Pupils are equal and briskly reactive to light. Visual fields by finger perimetry are intact. Hearing to finger rub intact.   Facial sensation intact to fine touch. Facial motor strength is symmetric and tongue and uvula move midline.  Motor exam:  Normal tone , muscle bulk and  symmetric normal strength in all extremities. Sensory:  Fine touch, pinprick and vibration were normal.  Coordination: Rapid alternating movements in the fingers/hands is tested and normal.  Finger-to-nose maneuverwithout evidence of ataxia, dysmetria or tremor.  Gait and station: Patient walks without assistive device . Strength within normal limits. Stance is stable and normal.      Assessment:  After physical and neurologic examination, review of laboratory studies, imaging, neurophysiology testing and pre-existing records, assessment is   1) patient with a  Renewed diagnosis  of  Severe sleep sleep apnea, now treated fon CPAP 11 cm water. Marland Kitchen 3)  Diabetes, nocturia , retrognathia, weight gain and age related change may have caused worsening of the OSA,  And fatigue and sleepiness.   Plan:  Treatment plan and additional workup :  11 cm water pressure CPAP, 3 cm EPR.  Consider nuvigil for this patient with high CPAP compliance and residual Epworth of 15, FSS of 30.  Persistent daytime slepiness - retired , not under the same pressure. Will try Nuvigil / modafinil prn.

## 2014-05-30 NOTE — Progress Notes (Deleted)
Subjective:    Patient ID: Antonio Wells is a 68 y.o. male.  HPI {Common ambulatory SmartLinks:[id number]}  Review of Systems  Objective:  Neurologic Exam  Physical Exam  Assessment:   ***  Plan:   ***

## 2014-05-31 ENCOUNTER — Encounter: Payer: Medicare Other | Attending: Urgent Care | Admitting: *Deleted

## 2014-05-31 VITALS — Ht 63.5 in | Wt 204.8 lb

## 2014-05-31 DIAGNOSIS — E1165 Type 2 diabetes mellitus with hyperglycemia: Secondary | ICD-10-CM | POA: Diagnosis not present

## 2014-05-31 DIAGNOSIS — IMO0002 Reserved for concepts with insufficient information to code with codable children: Secondary | ICD-10-CM

## 2014-05-31 DIAGNOSIS — Z713 Dietary counseling and surveillance: Secondary | ICD-10-CM | POA: Insufficient documentation

## 2014-05-31 NOTE — Progress Notes (Signed)
Diabetes Self-Management Education  Visit Type:  Initial  Appt. Start Time: 0800 Appt. End Time: 0930  05/31/2014  [name], identified by name and date of birth, is a 68 y.o. male with a diagnosis of Diabetes: Type 2.  Other people present during visit:  Patient   ASSESSMENT  Height 5' 3.5" (1.613 m), weight 204 lb 12.8 oz (92.897 kg). Body mass index is 35.71 kg/(m^2).  Initial Visit Information:  Are you currently following a meal plan?: No   Are you taking your medications as prescribed?: Yes Are you checking your feet?: Yes How many days per week are you checking your feet?: 7 How often do you need to have someone help you when you read instructions, pamphlets, or other written materials from your doctor or pharmacy?: 1 - Never What is the last grade level you completed in school?: college  Psychosocial:     Patient Belief/Attitude about Diabetes: Motivated to manage diabetes Self-care barriers: None Self-management support: Doctor's office Other persons present: Patient Patient Concerns: Nutrition/Meal planning Special Needs: None Preferred Learning Style: Visual Learning Readiness: Ready  Complications:   Last HgB A1C per patient/outside source: 7.7 % How often do you check your blood sugar?: 1-2 times/day (has new meter, not using yet, hasn't tested in about a month) Fasting Blood glucose range (mg/dL): 130-179 Have you had a dilated eye exam in the past 12 months?: Yes Have you had a dental exam in the past 12 months?: Yes  Diet Intake:  Breakfast: 2 pancakes with syrup OR eggs and toast OR fruit with fruit juice or water, coffee with cream and 2 pkgs Splenda Snack (morning): not usually Lunch: sandwich OR burger and small order of fries, diet soda or water Snack (afternoon): chips OR cookies occasionally Dinner: often lean meat, starch and vegetable, bread, diet soda or water Snack (evening): 1 or 2 miniature candy bars Beverage(s): diet soda  or water, coffee, fruit juice  Exercise:  Exercise: ADL's (interested in biking)  Individualized Plan for Diabetes Self-Management Training:   Learning Objective:  Patient will have a greater understanding of diabetes self-management.  Patient education plan per assessed needs and concerns is to attend individual sessions for     Education Topics Reviewed with Patient Today:  Definition of diabetes, type 1 and 2, and the diagnosis of diabetes Role of diet in the treatment of diabetes and the relationship between the three main macronutrients and blood glucose level, Reviewed blood glucose goals for pre and post meals and how to evaluate the patients' food intake on their blood glucose level., Food label reading, portion sizes and measuring food., Carbohydrate counting Role of exercise on diabetes management, blood pressure control and cardiac health. Reviewed patients medication for diabetes, action, purpose, timing of dose and side effects. Identified appropriate SMBG and/or A1C goals., Purpose and frequency of SMBG. Taught treatment of hypoglycemia - the 15 rule., Other (comment) (Discussed risk of hypoglycemia while on Glipizide if carb intake or increase in activity change) Relationship between chronic complications and blood glucose control Role of stress on diabetes      PATIENTS GOALS/Plan (Developed by the patient):  Nutrition: Adjust meds/carbs with exercise as discussed, Follow meal plan discussed Medications: take my medication as prescribed Monitoring : test blood glucose pre and post meals as discussed Reducing Risk: treat hypoglycemia with 15 grams of carbs if blood glucose less than 70mg /dL  Plan:   Patient Instructions  Plan:  Aim for 3 Carb Choices per meal (45 grams) +/-  1 either way  Aim for 0-2 Carbs per snack if hungry  Include protein in moderation with your meals and snacks Consider reading food labels for Total Carbohydrate of foods Consider  increasing  your activity level as tolerated Consider checking BG at alternate times per day         Expected Outcomes:  Demonstrated interest in learning. Expect positive outcomes  Education material provided: Living Well with Diabetes, Meal plan card and Carbohydrate counting sheet  If problems or questions, patient to contact team via:  Phone and Email  Future DSME appointment: PRN

## 2014-05-31 NOTE — Patient Instructions (Signed)
Plan:  Aim for 3 Carb Choices per meal (45 grams) +/- 1 either way  Aim for 0-2 Carbs per snack if hungry  Include protein in moderation with your meals and snacks Consider reading food labels for Total Carbohydrate of foods Consider  increasing your activity level as tolerated Consider checking BG at alternate times per day

## 2014-06-04 ENCOUNTER — Telehealth: Payer: Self-pay | Admitting: Neurology

## 2014-06-04 NOTE — Telephone Encounter (Signed)
Called the patient and advised him of normal overnight pulse oximetry results.  Patient verbalized understanding.

## 2014-07-01 ENCOUNTER — Encounter: Payer: Self-pay | Admitting: Family Medicine

## 2014-07-05 ENCOUNTER — Telehealth: Payer: Self-pay

## 2014-07-05 NOTE — Telephone Encounter (Signed)
Left message for patient to have his flu shot.

## 2014-07-08 ENCOUNTER — Ambulatory Visit (INDEPENDENT_AMBULATORY_CARE_PROVIDER_SITE_OTHER): Payer: Medicare Other | Admitting: Cardiology

## 2014-07-08 ENCOUNTER — Encounter: Payer: Self-pay | Admitting: Cardiology

## 2014-07-08 VITALS — BP 118/78 | HR 71 | Ht 63.5 in | Wt 209.0 lb

## 2014-07-08 DIAGNOSIS — R0609 Other forms of dyspnea: Secondary | ICD-10-CM | POA: Diagnosis not present

## 2014-07-08 DIAGNOSIS — I272 Pulmonary hypertension, unspecified: Secondary | ICD-10-CM

## 2014-07-08 DIAGNOSIS — I270 Primary pulmonary hypertension: Secondary | ICD-10-CM | POA: Diagnosis not present

## 2014-07-08 DIAGNOSIS — I10 Essential (primary) hypertension: Secondary | ICD-10-CM

## 2014-07-08 DIAGNOSIS — I340 Nonrheumatic mitral (valve) insufficiency: Secondary | ICD-10-CM | POA: Diagnosis not present

## 2014-07-08 DIAGNOSIS — R0600 Dyspnea, unspecified: Secondary | ICD-10-CM

## 2014-07-08 DIAGNOSIS — I350 Nonrheumatic aortic (valve) stenosis: Secondary | ICD-10-CM | POA: Diagnosis not present

## 2014-07-08 MED ORDER — AMLODIPINE BESYLATE 10 MG PO TABS: 10.0000 mg | ORAL_TABLET | Freq: Every day | ORAL | Status: DC

## 2014-07-08 MED ORDER — FUROSEMIDE 20 MG PO TABS: 20.0000 mg | ORAL_TABLET | Freq: Every day | ORAL | Status: DC | PRN

## 2014-07-08 MED ORDER — LOSARTAN POTASSIUM 100 MG PO TABS: 100.0000 mg | ORAL_TABLET | Freq: Every day | ORAL | Status: DC

## 2014-07-08 NOTE — Addendum Note (Signed)
Addended by: Newt Minion on: 07/08/2014 01:31 PM   Modules accepted: Orders

## 2014-07-08 NOTE — Patient Instructions (Addendum)
Your physician wants you to follow-up in: 6 MONTHS; You will need LAB work at least a week before this appointment.   Your physician has requested that you have an echocardiogram. Echocardiography is a painless test that uses sound waves to create images of your heart. It provides your doctor with information about the size and shape of your heart and how well your heart's chambers and valves are working. This procedure takes approximately one hour. There are no restrictions for this procedure. TO BE DONE IN ABOUT 6 MONTHS; WEEK OR 2 BEFORE NEXT APPT.    Your physician has recommended you make the following change in your medication:  1)STOP Losartan-Hydrochlorothiazide   2) START AMLODIPINE 10 MG DAILY  3) START LOSARTAN 100 MG DAILY  4) START LASIX 20 MG DAILY, AS NEEDED

## 2014-07-08 NOTE — Progress Notes (Signed)
Patient ID: RAVON MORTELLARO, male   DOB: 1945-10-08, 68 y.o.   MRN: [id number]    Patient Name: Antonio Wells Date of Encounter: 07/08/2014  Primary Care Provider:  [name], MD Primary Cardiologist:  [name]  Problem List   Past Medical History  Diagnosis Date  . Diabetes mellitus without complication   . Hypertension   . High cholesterol   . Gout   . Heart murmur   . Sleep apnea    Past Surgical History  Procedure Laterality Date  . Colonoscopy  2008   Allergies  Allergies  Allergen Reactions  . Lisinopril Cough  . Penicillins Other (See Comments)    Passed out  . Tramadol     Hiccups and vomiting   HPI  A pleasant 68 year old male with prior medical history of insulin-dependent diabetes mellitus known for the last 5 years, hypertension diagnosed 15 years ago, hyperlipidemia, obstructive sleep apnea, and prior history of smoking, quit 15 years ago who is coming with concern of dyspnea on exertion.the patient is currently retired and states that he used to exercise on a regular basis. Specifically he was use a stationary bike and exercise for up to an hour 3 times a week. However shows he also states that lately he hasn't been that active and noticed that he gets short of breath easily. This is a episodic sometimes he can exercise no problem and then other times he gets short of breath there quickly for example after walking one or 2 flight of stairs. He is very compliant with his medications. He quit smoking 15 years ago. He was prescribed a CPAP machine for obstructive sleep apnea but is not using it. He denies any chest pain left arm pain jaw pain or back pain, no palpitations or syncope no orthopnea or lower extremity edema.  The patient also states that his blood pressure has been uncontrolled lately.  Since the last visit patient blood pressure has been under much better control. He states that his shortness of breath has significantly improved  since the blood pressure has been better controlled. He denies any chest pain palpitation or syncope.he denies any side effect Bystolic.  [id number] -  The patient is coming after 6 months , he was prescribed nebivolol however couldn't afford it. He is compliant with his medicine but doesn't exercise. He states that his limiting factors are knee and hip pain. He denies any chest pain , claudications , palpitations orthopnea.  He has occasional lower extremity edema. He is asking if there is generic to felodipine. He had an episode of shortness of breath months ago that has resolved.   Home Medications  Prior to Admission medications   Medication Sig Start Date End Date Taking? Authorizing Provider  aspirin EC 81 MG tablet Take 1 tablet (81 mg total) by mouth daily. 05/31/13  Yes Lauren Burnetta Sabin, PA-C  colchicine 0.6 MG tablet Take 1-2 tablets (0.6-1.2 mg total) by mouth daily as needed (for gout flare). 2 po at onset of flair and then repeat in 1 h for acute gout attack then 1 po qd until pain free 05/31/13  Yes Lauren Burnetta Sabin, PA-C  felodipine (PLENDIL) 10 MG 24 hr tablet Take 1 tablet (10 mg total) by mouth daily. 07/27/13  Yes Mancel Bale, PA-C  glipiZIDE (GLUCOTROL) 10 MG tablet Take 1 tablet (10 mg total) by mouth 2 (two) times daily before a meal. 07/27/13  Yes Mancel Bale, PA-C  glucose blood test  strip Use as directed 05/31/13  Yes Lauren Burnetta Sabin, PA-C  losartan-hydrochlorothiazide (HYZAAR) 100-12.5 MG per tablet Take 1 tablet by mouth daily. 10/24/13  Yes [name], MD  lovastatin (MEVACOR) 20 MG tablet Take 1 tablet (20 mg total) by mouth at bedtime. 07/27/13  Yes Mancel Bale, PA-C  metFORMIN (GLUCOPHAGE) 500 MG tablet Take 1 tablet (500 mg total) by mouth 2 (two) times daily with a meal. 07/27/13  Yes Mancel Bale, PA-C  metoprolol succinate (TOPROL-XL) 100 MG 24 hr tablet Take 1 tablet (100 mg total) by mouth daily. 07/27/13  Yes Mancel Bale, PA-C    Family History  Family  History  Problem Relation Age of Onset  . Hypertension Mother   . Hypertension Father   . Cancer Brother     lung  . Stroke Brother   . Colon cancer Neg Hx     Social History  History   Social History  . Marital Status: Married    Spouse Name: [name]    Number of Children: 3  . Years of Education: 16   Occupational History  . Musician    Social History Main Topics  . Smoking status: Former Smoker -- 2.00 packs/day for 15 years    Quit date: 07/26/1998  . Smokeless tobacco: Never Used  . Alcohol Use: Yes     Comment: occasionally  . Drug Use: No  . Sexual Activity: Yes   Other Topics Concern  . Not on file   Social History Narrative   Patient is married [name]) and lives at home with his wife.   Patient has three adult children.   Patient is retired.   Patient is a Research officer, political party.   Patient is right-handed.   Patient drinks one cup of coffee daily and 2-3 sodas per day.           Review of Systems, as per HPI, otherwise negative General:  No chills, fever, night sweats or weight changes.  Cardiovascular:  No chest pain, dyspnea on exertion, edema, orthopnea, palpitations, paroxysmal nocturnal dyspnea. Dermatological: No rash, lesions/masses Respiratory: No cough, dyspnea Urologic: No hematuria, dysuria Abdominal:   No nausea, vomiting, diarrhea, bright red blood per rectum, melena, or hematemesis Neurologic:  No visual changes, wkns, changes in mental status. All other systems reviewed and are otherwise negative except as noted above.  Physical Exam  Blood pressure 118/78, pulse 71, height 5' 3.5" (1.613 m), weight 209 lb (94.802 kg), SpO2 97 %.  General: Pleasant, NAD Psych: Normal affect. Neuro: Alert and oriented X 3. Moves all extremities spontaneously. HEENT: Normal  Neck: Supple without bruits or JVD. Lungs:  Resp regular and unlabored, CTA. Heart: RRR no s3, s4, or 3/6 systolic murmur. Abdomen: Soft, non-tender, non-distended, BS + x 4.    Extremities: No clubbing, cyanosis or edema. DP/PT/Radials 2+ and equal bilaterally.  Labs:  No results for input(s): CKTOTAL, CKMB, TROPONINI in the last 72 hours. Lab Results  Component Value Date   WBC 4.2* 05/24/2014   HGB 13.4* 05/24/2014   HCT 43.0* 05/24/2014   MCV 95.0 05/24/2014   PLT 180 02/20/2014    No results found for: DDIMER Invalid input(s): POCBNP    Component Value Date/Time   NA 141 05/24/2014 0950   K 4.1 05/24/2014 0950   CL 101 05/24/2014 0950   CO2 27 05/24/2014 0950   GLUCOSE 151* 05/24/2014 0950   BUN 17 05/24/2014 0950   CREATININE 1.31 05/24/2014 0950   CREATININE 1.36*  02/20/2014 1041   CALCIUM 8.9 05/24/2014 0950   PROT 7.0 05/24/2014 0950   ALBUMIN 4.7 05/24/2014 0950   AST 20 05/24/2014 0950   ALT 29 05/24/2014 0950   ALKPHOS 45 05/24/2014 0950   BILITOT 0.4 05/24/2014 0950   GFRNONAA 56* 05/24/2014 0950   GFRNONAA 52* 02/20/2014 1041   GFRAA 65 05/24/2014 0950   GFRAA 61* 02/20/2014 1041   Lab Results  Component Value Date   CHOL 157 05/24/2014   HDL 46 05/24/2014   LDLCALC 87 05/24/2014   TRIG 122 05/24/2014    Accessory Clinical Findings  Echocardiogram -12/20/2013   - Left ventricle: The cavity size was normal. There was mild concentric hypertrophy. Systolic function was normal. The estimated ejection fraction was in the range of 60% to 65%. Wall motion was normal; there were no regional wall motion abnormalities. Doppler parameters are consistent with abnormal left ventricular relaxation (grade 1 diastolic dysfunction). The E/e&' ratio is >15, suggesting elevated LV filling pressure. - Aortic valve: Trileaflet; mildly calcified leaflets. There is mild aortic stenosis. Peak and mean gradients of 18 and 13 mmHg, respectively. Based on an LVOT diameter of 2.2, the calculated AVA is 1.5-1.6 cm2. - Mitral valve: There was mild regurgitation. - Atrial septum: No defect or patent foramen ovale was identified. - Tricuspid  valve: There was mild regurgitation. - Pulmonary arteries: PA peak pressure: 37 mm Hg (S). Mild pulmonary hypertension.  Impressions:  - LVEF of 60-65%, mild concentric LVH. Diastolic dysfunction with elevated LV filling pressure. Mild aortic stenosis - AVA around 1.6 cm2. Mild pulmonary hypertension - RVSP 37 mmHg.  ECG - Sinus rhythm, nonspecific ST-T wave several days.  Lexiscan nuclear stress test; 12/21/13 Quantitative Gated Spect Images  QGS EDV: 106 ml  QGS ESV: 50 ml  Impression  Exercise Capacity: Lexiscan with low level exercise.  BP Response: Normal blood pressure response.  Clinical Symptoms: There is dyspnea.  ECG Impression: Nonspecific T wave abnormality during exercise and Lexiscan infusion and in recovery  Comparison with Prior Nuclear Study: No images to compare  Overall Impression: Normal stress nuclear study.  LV Ejection Fraction: 53%. LV Wall Motion: NL LV Function; NL Wall Motion   Assessment & Plan  68 year old male with multiple risk factors for coronary artery disease including prior smoking, obesity, insulin-dependent diabetes mellitus, hyperlipidemia, uncontrolled hypertension and untreated obstructive sleep apnea who is coming with concerns of   1. Dyspnea on exertion. EKG shows nonspecific ST-T wave abnormalities.  the patient only performed a lower of exercise and a stress test was converted to [employer]. However it showed no prior scar and no ischemia. It showed normal blood pressure response to stress. Since the patient is deconditioned we advised on proper exercise and his frequency.  2. Hypertension -  Controlled now , we will change few medications. We'll switch felodipine to amlodipine 10 mg daily as it is generic. We will also break Hyzaar to losartan 100 mg daily. And discontinue hydrochlorothiazide as he has history of gout. Echocardiogram showed mild LVH impaired for observation and mild pulmonary hypertension.   3. Lower extremity edema -   Episodic, we'll prescribe Lasix 20 mg daily as needed.  4. Murmur - mild mitral regurgitation and mild aortic stenosis, but will follow with echocardiogram in 1 year , Prior to next visit.  5. Hyperlipidemia - followed by his primary care currently on lovastatin.  Followup in 6 months.   [name], MD, [facility] 07/08/2014, 10:13 AM

## 2014-07-08 NOTE — Addendum Note (Signed)
Addended by: Newt Minion on: 07/08/2014 01:19 PM   Modules accepted: Orders

## 2014-07-29 ENCOUNTER — Other Ambulatory Visit: Payer: Self-pay | Admitting: Family Medicine

## 2014-08-31 ENCOUNTER — Other Ambulatory Visit: Payer: Self-pay | Admitting: Radiology

## 2014-08-31 DIAGNOSIS — G4730 Sleep apnea, unspecified: Secondary | ICD-10-CM

## 2014-08-31 DIAGNOSIS — I10 Essential (primary) hypertension: Secondary | ICD-10-CM

## 2014-08-31 DIAGNOSIS — Z9989 Dependence on other enabling machines and devices: Secondary | ICD-10-CM

## 2014-08-31 DIAGNOSIS — G4733 Obstructive sleep apnea (adult) (pediatric): Secondary | ICD-10-CM

## 2014-08-31 DIAGNOSIS — G4710 Hypersomnia, unspecified: Secondary | ICD-10-CM

## 2014-08-31 NOTE — Telephone Encounter (Signed)
[facility] called: Patient requesting refills:  metformin 500mg  1 BID 90 day supply #180 Metoprolol 50mg  1 BID 90 day supply #180

## 2014-09-02 MED ORDER — METOPROLOL TARTRATE 50 MG PO TABS: 50.0000 mg | ORAL_TABLET | Freq: Two times a day (BID) | ORAL | Status: DC

## 2014-09-02 MED ORDER — METFORMIN HCL 500 MG PO TABS: 500.0000 mg | ORAL_TABLET | Freq: Two times a day (BID) | ORAL | Status: DC

## 2014-09-03 ENCOUNTER — Telehealth: Payer: Self-pay

## 2014-09-03 NOTE — Telephone Encounter (Signed)
Pt called in stating that he had his pharmacy call in his RX on Saturday 08/31/14 and he had not heard anything back from [place] or them so he wanted to check on the status. He was needing his metoprolol (LOPRESSOR) 50 MG tablet even though he stated he is taking 100MG  now and also Metformin 500MG  which I do not see on his med list. [facility] [address]  He would like to be called back on [phone]

## 2014-09-04 ENCOUNTER — Telehealth: Payer: Self-pay | Admitting: Radiology

## 2014-09-04 NOTE — Telephone Encounter (Signed)
Gave pt message that rx's had been called in.

## 2014-10-01 ENCOUNTER — Other Ambulatory Visit: Payer: Self-pay | Admitting: Family Medicine

## 2014-10-24 ENCOUNTER — Other Ambulatory Visit: Payer: Self-pay | Admitting: Family Medicine

## 2014-10-24 NOTE — Telephone Encounter (Signed)
Spoke to pt about need for f/up. He agreed to make appt, was set up for 11/22/14. I RFd his metformin until then.

## 2014-11-14 ENCOUNTER — Other Ambulatory Visit: Payer: Self-pay

## 2014-11-14 MED ORDER — LOSARTAN POTASSIUM 100 MG PO TABS: 100.0000 mg | ORAL_TABLET | Freq: Every day | ORAL | Status: DC

## 2014-11-14 MED ORDER — AMLODIPINE BESYLATE 10 MG PO TABS: 10.0000 mg | ORAL_TABLET | Freq: Every day | ORAL | Status: DC

## 2014-11-22 ENCOUNTER — Ambulatory Visit (INDEPENDENT_AMBULATORY_CARE_PROVIDER_SITE_OTHER): Payer: Medicare Other | Admitting: Family Medicine

## 2014-11-22 ENCOUNTER — Encounter: Payer: Self-pay | Admitting: Family Medicine

## 2014-11-22 VITALS — BP 142/78 | HR 72 | Temp 97.9°F | Resp 16 | Ht 62.75 in | Wt 208.0 lb

## 2014-11-22 DIAGNOSIS — I10 Essential (primary) hypertension: Secondary | ICD-10-CM

## 2014-11-22 DIAGNOSIS — E1165 Type 2 diabetes mellitus with hyperglycemia: Secondary | ICD-10-CM

## 2014-11-22 DIAGNOSIS — E669 Obesity, unspecified: Secondary | ICD-10-CM | POA: Diagnosis not present

## 2014-11-22 DIAGNOSIS — G4733 Obstructive sleep apnea (adult) (pediatric): Secondary | ICD-10-CM | POA: Diagnosis not present

## 2014-11-22 DIAGNOSIS — G4710 Hypersomnia, unspecified: Secondary | ICD-10-CM

## 2014-11-22 DIAGNOSIS — Z125 Encounter for screening for malignant neoplasm of prostate: Secondary | ICD-10-CM

## 2014-11-22 DIAGNOSIS — Z79899 Other long term (current) drug therapy: Secondary | ICD-10-CM | POA: Diagnosis not present

## 2014-11-22 DIAGNOSIS — IMO0002 Reserved for concepts with insufficient information to code with codable children: Secondary | ICD-10-CM

## 2014-11-22 DIAGNOSIS — I080 Rheumatic disorders of both mitral and aortic valves: Secondary | ICD-10-CM

## 2014-11-22 DIAGNOSIS — M79645 Pain in left finger(s): Secondary | ICD-10-CM

## 2014-11-22 DIAGNOSIS — R609 Edema, unspecified: Secondary | ICD-10-CM

## 2014-11-22 DIAGNOSIS — E7800 Pure hypercholesterolemia, unspecified: Secondary | ICD-10-CM

## 2014-11-22 DIAGNOSIS — D649 Anemia, unspecified: Secondary | ICD-10-CM

## 2014-11-22 DIAGNOSIS — R972 Elevated prostate specific antigen [PSA]: Secondary | ICD-10-CM

## 2014-11-22 DIAGNOSIS — E119 Type 2 diabetes mellitus without complications: Secondary | ICD-10-CM | POA: Diagnosis not present

## 2014-11-22 DIAGNOSIS — R0609 Other forms of dyspnea: Secondary | ICD-10-CM

## 2014-11-22 DIAGNOSIS — M109 Gout, unspecified: Secondary | ICD-10-CM

## 2014-11-22 MED ORDER — FUROSEMIDE 20 MG PO TABS: 20.0000 mg | ORAL_TABLET | Freq: Every day | ORAL | Status: DC

## 2014-11-22 MED ORDER — POTASSIUM CHLORIDE ER 10 MEQ PO TBCR: 10.0000 meq | EXTENDED_RELEASE_TABLET | Freq: Every day | ORAL | Status: DC

## 2014-11-22 MED ORDER — METHYLPHENIDATE HCL ER 10 MG PO TBCR: 10.0000 mg | EXTENDED_RELEASE_TABLET | Freq: Every day | ORAL | Status: DC

## 2014-11-22 NOTE — Patient Instructions (Signed)
De Quervain's Tenosynovitis De Quervain's tenosynovitis involves inflammation of one or two tendon linings (sheaths) or strain of one or two tendons to the thumb: extensor pollicis brevis (EPB), or abductor pollicis longus (APL). This causes pain on the side of the wrist and base of the thumb. Tendon sheaths secrete a fluid that lubricates the tendon, allowing the tendon to move smoothly. When the sheath becomes inflamed, the tendon cannot move freely in the sheath. Both the EPB and APL tendons are important for proper use of the hand. The EPB tendon is important for straightening the thumb. The APL tendon is important for moving the thumb away from the index finger (abducting). The two tendons pass through a small tube (canal) in the wrist, near the base of the thumb. When the tendons become inflamed, pain is usually felt in this area. SYMPTOMS   Pain, tenderness, swelling, warmth, or redness over the base of the thumb and thumb side of the wrist.  Pain that gets worse when straightening the thumb.  Pain that gets worse when moving the thumb away from the index finger, against resistance.  Pain with pinching or gripping.  Locking or catching of the thumb.  Limited motion of the thumb.  Crackling sound (crepitation) when the tendon or thumb is moved or touched.  Fluid-filled cyst in the area of the base of the thumb. CAUSES   Tenosynovitis is often linked with overuse of the wrist.  Tenosynovitis may be caused by repeated injury to the thumb muscle and tendon units, and with repeated motions of the hand and wrist, due to friction of the tendon within the lining (sheath).  Tenosynovitis may also be due to a sudden increase in activity or change in activity. RISK INCREASES WITH:  Sports that involve repeated hand and wrist motions (golf, bowling, tennis, squash, racquetball).  Heavy labor.  Poor physical wrist strength and flexibility.  Failure to warm up properly before practice or  play.  Male gender.  New mothers who hold their baby's head for long periods or lift infants with thumbs in the infant's armpit (axilla). PREVENTION  Warm up and stretch properly before practice or competition.  Allow enough time for rest and recovery between practices and competition.  Maintain appropriate conditioning:  Cardiovascular fitness.  Forearm, wrist, and hand flexibility.  Muscle strength and endurance.  Use proper exercise technique. PROGNOSIS  This condition is usually curable within 6 weeks, if treated properly with non-surgical treatment and resting of the affected area.  RELATED COMPLICATIONS   Longer healing time if not properly treated or if not given enough time to heal.  Chronic inflammation, causing recurring symptoms of tenosynovitis. Permanent pain or restriction of movement.  Risks of surgery: infection, bleeding, injury to nerves (numbness of the thumb), continued pain, incomplete release of the tendon sheath, recurring symptoms, cutting of the tendons, tendons sliding out of position, weakness of the thumb, thumb stiffness. TREATMENT  First, treatment involves the use of medicine and ice, to reduce pain and inflammation. Patients are encouraged to stop or modify activities that aggravate the injury. Stretching and strengthening exercises may be advised. Exercises may be completed at home or with a therapist. You may be fitted with a brace or splint, to limit motion and allow the injury to heal. Your caregiver may also choose to give you a corticosteroid injection, to reduce the pain and inflammation. If non-surgical treatment is not successful, surgery may be needed. Most tenosynovitis surgeries are done as outpatient procedures (you go home the   same day). Surgery may involve local, regional (whole arm), or general anesthesia.  MEDICATION   If pain medicine is needed, nonsteroidal anti-inflammatory medicines (aspirin and ibuprofen), or other minor pain  relievers (acetaminophen), are often advised.  Do not take pain medicine for 7 days before surgery.  Prescription pain relievers are often prescribed only after surgery. Use only as directed and only as much as you need.  Corticosteroid injections may be given if your caregiver thinks they are needed. There is a limited number of times these injections may be given. COLD THERAPY   Cold treatment (icing) should be applied for 10 to 15 minutes every 2 to 3 hours for inflammation and pain, and immediately after activity that aggravates your symptoms. Use ice packs or an ice massage. SEEK MEDICAL CARE IF:   Symptoms get worse or do not improve in 2 to 4 weeks, despite treatment.  You experience pain, numbness, or coldness in the hand.  Blue, gray, or dark color appears in the fingernails.  Any of the following occur after surgery: increased pain, swelling, redness, drainage of fluids, bleeding in the affected area, or signs of infection.  New, unexplained symptoms develop. (Drugs used in treatment may produce side effects.) Document Released: 07/12/2005 Document Revised: 10/04/2011 Document Reviewed: 10/24/2008 Exit[employer] Patient Information 2015 [employer]. This information is not intended to replace advice given to you by your health care provider. Make sure you discuss any questions you have with your health care provider.   

## 2014-11-22 NOTE — Progress Notes (Signed)
Subjective:    Patient ID: Antonio Wells, male    DOB: 1946/03/26, 69 y.o.   MRN: [id number] Chief Complaint  Patient presents with  . Medication Refill    amlodipine, losartan, metformin, lovastatin, glipizide, metoprolol, and furosemide    HPI  Pt is doing well.   He has started biking for exercise - road biking - needs to get helmet.  He is using cpap machine nightly and sleeping fine but still very very sleepy during the day - wants to nap all the time.  Dr. Westley Hummer had tried pt on Provigil which worked really well for him but he couldn't afford it - was going to be $800/mo.  He called neuro to ask if there was something more affordable that he could be switched to but has never heard back - has not tried anything else.  He has been noticing that is feet are swelling more - still fits in the same shoes - down to normal in a.m. When elevated then worsens throughout the day.  Has been taking lasix 20mg  for the pat sev days but it says prn on the bottle and he is unclear of when exactly he should start and stop the lasix or even what it is for.  Pt has appt w/ his cardiologist dr. Meda Coffee in June - get labs today.  He has stopped his plendil - having trouble w expense and claudication sxs seem to be resolving.  Past Medical History  Diagnosis Date  . Diabetes mellitus without complication   . Hypertension   . High cholesterol   . Gout   . Heart murmur   . Sleep apnea    Current Outpatient Prescriptions on File Prior to Visit  Medication Sig Dispense Refill  . amLODipine (NORVASC) 10 MG tablet Take 1 tablet (10 mg total) by mouth daily. 90 tablet 1  . aspirin EC 81 MG tablet Take 1 tablet (81 mg total) by mouth daily. 30 tablet 0  . colchicine 0.6 MG tablet Take 1-2 tablets (0.6-1.2 mg total) by mouth daily as needed (for gout flare). 2 po at onset of flair and then repeat in 1 h for acute gout attack then 1 po qd until pain free 30 tablet 0  . glipiZIDE (GLUCOTROL) 10 MG  tablet Take 10 mg by mouth daily before breakfast.    . losartan (COZAAR) 100 MG tablet Take 1 tablet (100 mg total) by mouth daily. 90 tablet 1  . lovastatin (MEVACOR) 20 MG tablet TAKE ONE TABLET BY MOUTH AT BEDTIME 90 tablet 0  . metFORMIN (GLUCOPHAGE) 500 MG tablet Take 1 tablet (500 mg total) by mouth 2 (two) times daily with a meal. 60 tablet 0  . metoprolol (LOPRESSOR) 50 MG tablet Take 1 tablet (50 mg total) by mouth 2 (two) times daily. 60 tablet 2   No current facility-administered medications on file prior to visit.   Allergies  Allergen Reactions  . Lisinopril Cough  . Penicillins Other (See Comments)    Passed out  . Tramadol     Hiccups and vomiting     Review of Systems  Constitutional: Positive for fatigue and unexpected weight change. Negative for fever, chills, activity change and appetite change.  Eyes: Negative for visual disturbance.  Respiratory: Positive for apnea. Negative for cough, shortness of breath and wheezing.   Cardiovascular: Positive for leg swelling. Negative for chest pain.  Genitourinary: Negative for dysuria, urgency, decreased urine volume and difficulty urinating.  Musculoskeletal: Positive for myalgias  and arthralgias. Negative for joint swelling and gait problem.  Skin: Positive for rash.  Neurological: Negative for dizziness, syncope, facial asymmetry, weakness, light-headedness, numbness and headaches.  Psychiatric/Behavioral: Positive for sleep disturbance. Negative for dysphoric mood. The patient is not nervous/anxious.        Objective:  BP 142/78 mmHg  Pulse 72  Temp(Src) 97.9 F (36.6 C) (Oral)  Resp 16  Ht 5' 2.75" (1.594 m)  Wt 208 lb (94.348 kg)  BMI 37.13 kg/m2  SpO2 98%  Physical Exam  Constitutional: He is oriented to person, place, and time. He appears well-developed and well-nourished. No distress.  HENT:  Head: Normocephalic and atraumatic.  Eyes: Conjunctivae are normal. Pupils are equal, round, and reactive to  light. No scleral icterus.  Neck: Normal range of motion. Neck supple. No thyromegaly present.  Cardiovascular: Normal rate, regular rhythm and intact distal pulses.   Murmur heard.  Systolic murmur is present with a grade of 3/6  2+ pitting edema bilaterally to distal knees  Pulmonary/Chest: Effort normal and breath sounds normal. No respiratory distress.  Musculoskeletal: He exhibits edema and tenderness.       Right hand: He exhibits decreased range of motion and bony tenderness. He exhibits normal capillary refill, no deformity and no swelling. Normal sensation noted.  Pain over first metacarpa worse with resisted extenion and medial rotation  Lymphadenopathy:    He has no cervical adenopathy.  Neurological: He is alert and oriented to person, place, and time.  Neg tinel's, phalen's, reverse phalen's.  THumb strength 4+/5   Skin: Skin is warm and dry. Rash noted. He is not diaphoretic. No erythema. No pallor.  Psychiatric: He has a normal mood and affect. His behavior is normal.      Diabetic Foot Exam - Simple   Simple Foot Form  Diabetic Foot exam was performed with the following findings:  Yes 11/22/2014  4:04 PM  Visual Inspection  Sensation Testing  Pulse Check  Comments         Assessment & Plan:   1. Type II diabetes mellitus, uncontrolled   2. Diabetes type 2, uncontrolled   3. Gout without tophus, unspecified cause, unspecified chronicity, unspecified site - cont prn colcyrs  4. Hypersomnia, persistent   5. Essential hypertension - mildly elevated. Check bp at home, starting lasix.  6. Mitral regurgitation and aortic stenosis   7. Obesity   8. High cholesterol   9. DOE (dyspnea on exertion)   10. OSA (obstructive sleep apnea)   11. 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 Peripheral edema - suspect mild venous stasis change due to HTN and abd obesity - try compression socks in winter, cont to be  active, push fluids, low salt, bike. Elevate legs when sedentary. Monitor weight at home and call if >3 lbs o/n or 5lbs in 1 wk with worsening edema so that diurtic can be adjusted.  INcrease lasix from 20 to 40 and start K10 replacement w/ lasix.  13. Polypharmacy   14. Anemia, unspecified anemia type   15. Screening for prostate cancer   16. Special screening for malignant neoplasm of prostate     Orders Placed This Encounter  Procedures  . Comprehensive metabolic panel    Order Specific Question:  Has the patient fasted?    Answer:  Yes  . Lipid panel    Order Specific Question:  Has the patient fasted?  Answer:  Yes  . Hemoglobin A1c  . TSH  . CBC  . Uric Acid  . PSA, Medicare  . HM Diabetes Foot Exam    Meds ordered this encounter  Medications  . furosemide (LASIX) 20 MG tablet    Sig: Take 1 tablet (20 mg total) by mouth daily.    Dispense:  90 tablet    Refill:  1  . potassium chloride (K-DUR) 10 MEQ tablet    Sig: Take 1 tablet (10 mEq total) by mouth daily.    Dispense:  90 tablet    Refill:  1  . methylphenidate 10 MG ER tablet    Sig: Take 1 tablet (10 mg total) by mouth daily.    Dispense:  30 tablet    Refill:  0   Over 40 min spent in face-to-face evaluation of and consultation with patient and coordination of care.  Over 50% of this time was spent counseling this patient.  Delman Cheadle, MD MPH   Results for orders placed or performed in visit on 11/22/14  Comprehensive metabolic panel  Result Value Ref Range   Sodium 142 135 - 145 mEq/L   Potassium 4.5 3.5 - 5.3 mEq/L   Chloride 105 96 - 112 mEq/L   CO2 26 19 - 32 mEq/L   Glucose, Bld 173 (H) 70 - 99 mg/dL   BUN 17 6 - 23 mg/dL   Creat 1.50 (H) 0.50 - 1.35 mg/dL   Total Bilirubin 0.3 0.2 - 1.2 mg/dL   Alkaline Phosphatase 57 39 - 117 U/L   AST 23 0 - 37 U/L   ALT 34 0 - 53 U/L   Total Protein 6.9 6.0 - 8.3 g/dL   Albumin 4.5 3.5 - 5.2 g/dL   Calcium 9.2 8.4 - 10.5 mg/dL  Lipid panel  Result  Value Ref Range   Cholesterol 149 0 - 200 mg/dL   Triglycerides 286 (H) <150 mg/dL   HDL 35 (L) >=40 mg/dL   Total CHOL/HDL Ratio 4.3 Ratio   VLDL 57 (H) 0 - 40 mg/dL   LDL Cholesterol 57 0 - 99 mg/dL  Hemoglobin A1c  Result Value Ref Range   Hgb A1c MFr Bld 9.6 (H) <5.7 %   Mean Plasma Glucose 229 (H) <117 mg/dL  TSH  Result Value Ref Range   TSH 3.244 0.350 - 4.500 uIU/mL  CBC  Result Value Ref Range   WBC 4.1 4.0 - 10.5 K/uL   RBC 4.30 4.22 - 5.81 MIL/uL   Hemoglobin 13.3 13.0 - 17.0 g/dL   HCT 39.9 39.0 - 52.0 %   MCV 92.8 78.0 - 100.0 fL   MCH 30.9 26.0 - 34.0 pg   MCHC 33.3 30.0 - 36.0 g/dL   RDW 14.4 11.5 - 15.5 %   Platelets 223 150 - 400 K/uL   MPV 12.2 8.6 - 12.4 fL  Uric Acid  Result Value Ref Range   Uric Acid, Serum 7.8 4.0 - 7.8 mg/dL  PSA, Medicare  Result Value Ref Range   PSA  <=4.00 ng/mL

## 2014-11-23 LAB — CBC
HEMATOCRIT: 39.9 % (ref 39.0–52.0)
Hemoglobin: 13.3 g/dL (ref 13.0–17.0)
MCH: 30.9 pg (ref 26.0–34.0)
MCHC: 33.3 g/dL (ref 30.0–36.0)
MCV: 92.8 fL (ref 78.0–100.0)
MPV: 12.2 fL (ref 8.6–12.4)
Platelets: 223 10*3/uL (ref 150–400)
RBC: 4.3 MIL/uL (ref 4.22–5.81)
RDW: 14.4 % (ref 11.5–15.5)
WBC: 4.1 10*3/uL (ref 4.0–10.5)

## 2014-11-23 LAB — COMPREHENSIVE METABOLIC PANEL
ALT: 34 U/L (ref 0–53)
AST: 23 U/L (ref 0–37)
Albumin: 4.5 g/dL (ref 3.5–5.2)
Alkaline Phosphatase: 57 U/L (ref 39–117)
BUN: 17 mg/dL (ref 6–23)
CALCIUM: 9.2 mg/dL (ref 8.4–10.5)
CO2: 26 meq/L (ref 19–32)
CREATININE: 1.5 mg/dL — AB (ref 0.50–1.35)
Chloride: 105 mEq/L (ref 96–112)
GLUCOSE: 173 mg/dL — AB (ref 70–99)
Potassium: 4.5 mEq/L (ref 3.5–5.3)
Sodium: 142 mEq/L (ref 135–145)
Total Bilirubin: 0.3 mg/dL (ref 0.2–1.2)
Total Protein: 6.9 g/dL (ref 6.0–8.3)

## 2014-11-23 LAB — URIC ACID: Uric Acid, Serum: 7.8 mg/dL (ref 4.0–7.8)

## 2014-11-23 LAB — LIPID PANEL
CHOLESTEROL: 149 mg/dL (ref 0–200)
HDL: 35 mg/dL — ABNORMAL LOW (ref >=40)
LDL Cholesterol: 57 mg/dL (ref 0–99)
TRIGLYCERIDES: 286 mg/dL — AB (ref <150)
Total CHOL/HDL Ratio: 4.3 Ratio
VLDL: 57 mg/dL — ABNORMAL HIGH (ref 0–40)

## 2014-11-23 LAB — HEMOGLOBIN A1C
HEMOGLOBIN A1C: 9.6 % — AB (ref <5.7)
Mean Plasma Glucose: 229 mg/dL — ABNORMAL HIGH (ref <117)

## 2014-11-23 LAB — TSH: TSH: 3.244 u[IU]/mL (ref 0.350–4.500)

## 2014-11-25 LAB — PSA, MEDICARE: PSA: 4.92 ng/mL — ABNORMAL HIGH (ref <=4.00)

## 2014-12-02 ENCOUNTER — Other Ambulatory Visit: Payer: Self-pay | Admitting: Family Medicine

## 2014-12-12 ENCOUNTER — Ambulatory Visit (INDEPENDENT_AMBULATORY_CARE_PROVIDER_SITE_OTHER): Payer: Medicare Other | Admitting: Neurology

## 2014-12-12 ENCOUNTER — Encounter: Payer: Self-pay | Admitting: Neurology

## 2014-12-12 VITALS — BP 108/80 | HR 82 | Resp 20 | Ht 63.78 in | Wt 208.5 lb

## 2014-12-12 DIAGNOSIS — Z9989 Dependence on other enabling machines and devices: Secondary | ICD-10-CM

## 2014-12-12 DIAGNOSIS — G4730 Sleep apnea, unspecified: Secondary | ICD-10-CM

## 2014-12-12 DIAGNOSIS — G4710 Hypersomnia, unspecified: Secondary | ICD-10-CM

## 2014-12-12 DIAGNOSIS — G4733 Obstructive sleep apnea (adult) (pediatric): Secondary | ICD-10-CM | POA: Diagnosis not present

## 2014-12-12 MED ORDER — METHYLPHENIDATE HCL ER 10 MG PO TBCR: 10.0000 mg | EXTENDED_RELEASE_TABLET | Freq: Every day | ORAL | Status: DC

## 2014-12-12 NOTE — Progress Notes (Signed)
[facility]  Provider:  Larey Wells, Antonio Wells  Referring Provider: [name], MD Primary Care Physician:  Antonio Agreste, MD  Chief Complaint  Patient presents with  . Follow-up    cpap, rm 11, alone    HPI:  Antonio Wells is a 69 y.o. male , who  is seen here as a referral  from Dr. Carlota Wells for sleep apnea evaluation,  Antonio Wells is afro-american , right handed gentleman with a known diagnosis of OSA, diagnosed in 2011. At the time he was still full time employed.  He was placed on CPAP after being told he had severe apnea. He discontinued using CPAP after he felt he had no benefit from using the CPAP.   He  felt neither refreshed nor restored and continues to have trouble staying awake in meetings. This "embarrassment " caused him to look for early retirement. He has poorly controled diabetes and confessed this may cause fatigue as well. He is now retired and often can take a nap after lunch , which would have been impossible while gainfully employed . 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  He is a Therapist, nutritional, Teacher, English as a foreign language - when reading or studying a new piece, he can fall asleep!  He has gained weight slowly, for  over a decade. He gained 10 pounds the last 3 years He will usually go to sleep around 11.00 PM , he watches TV in bedroom( set on a sleeper 15 minutes )  , he falls asleep promptly.It varied greatly how long he sleeps, due to pain form arthritis in back , knees and hips. He became a side sleeper.  He has some restless legs symptoms, witnessed PLMs.  He is a loud snorer and has apneas, witnessed by his wife, who shares the bedroom with him. The bed room  is cool, quiet and dark.  He wakes up with an alarm, needs it. He wakes up but doesn't rise , drinks a cup of coffee in the morning. Diet soda in afternoon. He is not a smoker and not ETOH drinker.  He has no trauma or surgeries to neck, face or  airway. No tonsillectomy.   Interval history 05-30-14, Antonio Wells underwent a split night polysomnography on 8-20 4-15. He was diagnosed with an AHI of 58.1 and an RDI of 59.4 nearly all apneas were obstructive in nature his lowest oxygen level was 66% saturation 26.7 minutes of time he was then titrated to CPAP and the hypoxemia was much shorter 9.7 minutes but the nadir was still 75%. He did well on 11 cm water /3 cm EPR , a F and P pico mask in large size,  nasal model , was used. He is here today for his first follow-up visit after CPAP was initiated. Today's compliance report for the last 30 days shows 100% compliance on days and 28 days of use a time over 4 hours at night. The was user time is 6 hours and 29 minutes the set pressure is 11 cm with 3 cm EPR the residual AHI is 1.6 and the airleak is some very modest at 7 L/m. The patient reports that he is no longer snoring this has been confirmed by his spouse.  One nocturia , some nights none. He feels refreshed in the morning , he feels still sleepy in PM. He has tried a small and large nasal mask, but not a medium or standard size. Cave  cm water pressure CPAP, 3 cm EPR.  Consider nuvigil for this patient with high CPAP compliance and residual Epworth of 15, FSS of 30.  Persistent daytime slepiness - retired , not under the same pressure.  Will try Nuvigil / modafinil prn.   Interval history 12-12-14  Antonio Wells is here today for a yearly revisit and CPAP compliance. His split-night polysomnography from 8-20 4-15 have been quoted above the patient had been titrated to 11 cm water was 170 m flex after a HI of 58 was diagnosed he also had 75% oxygen nadir. His compliance record is excellent 87% 4 days 26 out of 30 days, 21 of those days over 4 hours which is a compliance of 70% average user time right now is 4 hours and 30 minutes he has skipped about 3 days out of the last 40 days on CPAP use. The machine is still set at 11 cm water pressure with  3 cm EPR his AHI is 1.7. He does not need any adjustments this is an excellent result. I would like for Antonio Wells to use the machine about half an hour or more each day. Fatigue severity score is down to 23 points and Epworth sleepiness score was high at 16. Knowing that his sleep apnea is sufficiently treated I had prescribed modafinil after his last visit but she found was not covered by his insurance - burden to him with an out-of-pocket cost of $800.     Review of Systems: Out of a complete 14 system review, the patient complains of only the following symptoms, and all other reviewed systems are negative.  pre CPAP ;GDS 2 , FSS  23 from 36, Epworth  15 from 16   History   Social History  . Marital Status: Married    Spouse Name: Antonio Wells  . Number of Children: 3  . Years of Education: 16   Occupational History  . Musician    Social History Main Topics  . Smoking status: Former Smoker -- 2.00 packs/day for 15 years    Quit date: 07/26/1998  . Smokeless tobacco: Never Used  . Alcohol Use: Yes     Comment: occasionally  . Drug Use: No  . Sexual Activity: Yes   Other Topics Concern  . Not on file   Social History Narrative   Patient is married Antonio Wells) and lives at home with his wife.   Patient has three adult children.   Patient is retired.   Patient is a Research officer, political party.   Patient is right-handed.   Patient drinks one cup of coffee daily and 2-3 sodas per day.          Family History  Problem Relation Age of Onset  . Hypertension Mother   . Hypertension Father   . Cancer Brother     lung  . Stroke Brother   . Colon cancer Neg Hx     Past Medical History  Diagnosis Date  . Diabetes mellitus without complication   . Hypertension   . High cholesterol   . Gout   . Heart murmur   . Sleep apnea     Past Surgical History  Procedure Laterality Date  . Colonoscopy  2008    Current Outpatient Prescriptions  Medication Sig Dispense Refill  . amLODipine  (NORVASC) 10 MG tablet Take 1 tablet (10 mg total) by mouth daily. 90 tablet 1  . aspirin EC 81 MG tablet Take 1 tablet (81 mg total) by mouth daily. 30 tablet  0  . colchicine 0.6 MG tablet Take 1-2 tablets (0.6-1.2 mg total) by mouth daily as needed (for gout flare). 2 po at onset of flair and then repeat in 1 h for acute gout attack then 1 po qd until pain free 30 tablet 0  . furosemide (LASIX) 20 MG tablet Take 1 tablet (20 mg total) by mouth daily. 90 tablet 1  . glipiZIDE (GLUCOTROL) 10 MG tablet Take 10 mg by mouth daily before breakfast.    . losartan (COZAAR) 100 MG tablet Take 1 tablet (100 mg total) by mouth daily. 90 tablet 1  . lovastatin (MEVACOR) 20 MG tablet TAKE ONE TABLET BY MOUTH AT BEDTIME 90 tablet 0  . metFORMIN (GLUCOPHAGE) 500 MG tablet Take 1 tablet (500 mg total) by mouth 2 (two) times daily with a meal. 60 tablet 0  . metoprolol (LOPRESSOR) 50 MG tablet TAKE 1 TABLET BY MOUTH TWICE A DAY 60 tablet 2  . potassium chloride (K-DUR) 10 MEQ tablet Take 1 tablet (10 mEq total) by mouth daily. 90 tablet 1  . methylphenidate 10 MG ER tablet Take 1 tablet (10 mg total) by mouth daily. (Patient not taking: Reported on 12/12/2014) 30 tablet 0   No current facility-administered medications for this visit.    Allergies as of 12/12/2014 - Review Complete 12/12/2014  Allergen Reaction Noted  . Lisinopril Cough 08/12/2011  . Penicillins Other (See Comments) 08/12/2011  . Tramadol  02/04/2014    Vitals: BP 108/80 mmHg  Pulse 82  Resp 20  Ht 5' 3.78" (1.62 m)  Wt 208 lb 8 oz (94.575 kg)  BMI 36.04 kg/m2 Last Weight:  Wt Readings from Last 1 Encounters:  12/12/14 208 lb 8 oz (94.575 kg)   Last Height:   Ht Readings from Last 1 Encounters:  12/12/14 5' 3.78" (1.62 m)     Physical exam:  General: The patient is awake, alert and appears not in acute distress. The patient is well groomed. Head: Normocephalic, atraumatic. Neck is supple. Mallampati 3, neck circumference:  18.5 inches. He has clear retrognathia with overbite.  Cardiovascular:  Regular rate and rhythm.Marland Kitchen Respiratory: Lungs are clear to auscultation. Chest tightness reported.  Skin:  Without evidence of edema, or rash Trunk: BMI is elevated /normal posture.  Neurologic exam : The patient is awake and alert, oriented to place and time.  Memory subjective  described as intact. There is a normal attention span & concentration ability. Speech is fluent without dysarthria, dysphonia or aphasia. Cranial nerves: Pupils are equal and briskly reactive to light.  Visual fields by finger perimetry are intact. Hearing to finger rub intact.   Facial sensation intact to fine touch. He has no pressure marks form CPAP use.  Facial motor strength is symmetric and tongue and uvula move midline.  Motor exam:  Normal tone , muscle bulk and symmetric normal strength in all extremities.   Assessment:  After physical and neurologic examination, review of laboratory studies, imaging, neurophysiology testing and pre-existing records, assessment is   1) patient with a  Re-newed diagnosis of severe obstructive  sleep apnea, now treated on CPAP 11 cm water. His compliance can be slightly improved but the CPAP settings do not need adjustment, his residual AHI for May 2016 is 1.7 just very good. He remains excessively daytime sleepy and Nuvigil has not been covered by his health insurance Medicare plan. I discussed alternatives such as Ritalin and Adderall as being difficult to prescribe for an individual that may have left pressure  issues at baseline. Other medications that help to stay awake. 3)  Diabetes, nocturia, retrognathia, weight gain and age related change may have caused worsening of the OSA,  and fatigue and sleepiness.   Plan:  Treatment plan and additional workup :

## 2014-12-12 NOTE — Patient Instructions (Signed)
Hypersomnia Hypersomnia usually brings recurrent episodes of excessive daytime sleepiness or prolonged nighttime sleep. It is different than feeling tired due to lack of or interrupted sleep at night. People with hypersomnia are compelled to nap repeatedly during the day. This is often at inappropriate times such as:  At work.  During a meal.  In conversation. These daytime naps usually provide no relief. This disorder typically affects adolescents and young adults. CAUSES  This condition may be caused by:  Another sleep disorder (such as narcolepsy or sleep apnea).  Dysfunction of the autonomic nervous system.  Drug or alcohol abuse.  A physical problem, such as:  A tumor.  Head trauma. This is damage caused by an accident.  Injury to the central nervous system.  Certain medications, or medicine withdrawal.  Medical conditions may contribute to the disorder, including:  Multiple sclerosis.  Depression.  Encephalitis.  Epilepsy.  Obesity.  Some people appear to have a genetic predisposition to this disorder. In others, there is no known cause. SYMPTOMS   Patients often have difficulty waking from a long sleep. They may feel dazed or confused.  Other symptoms may include:  Anxiety.  Increased irritation (inflammation).  Decreased energy.  Restlessness.  Slow thinking.  Slow speech.  Loss of appetite.  Hallucinations.  Memory difficulty.  Tremors, Tics.  Some patients lose the ability to function in family, social, occupational, or other settings. TREATMENT  Treatment is symptomatic in nature. Stimulants and other drugs may be used to treat this disorder. Changes in behavior may help. For example, avoid night work and social activities that delay bed time. Changes in diet may offer some relief. Patients should avoid alcohol and caffeine. PROGNOSIS  The likely outcome (prognosis) for persons with hypersomnia depends on the cause of the disorder.  The disorder itself is not life threatening. But it can have serious consequences. For example, automobile accidents can be caused by falling asleep while driving. The attacks usually continue indefinitely. Document Released: 07/02/2002 Document Revised: 10/04/2011 Document Reviewed: 06/05/2008 [facility] Patient Information 2015 [place], [place]. This information is not intended to replace advice given to you by your health care provider. Make sure you discuss any questions you have with your health care provider.

## 2014-12-15 ENCOUNTER — Other Ambulatory Visit: Payer: Self-pay | Admitting: Family Medicine

## 2014-12-20 ENCOUNTER — Other Ambulatory Visit: Payer: Self-pay | Admitting: Family Medicine

## 2014-12-30 ENCOUNTER — Other Ambulatory Visit (INDEPENDENT_AMBULATORY_CARE_PROVIDER_SITE_OTHER): Payer: Medicare Other | Admitting: *Deleted

## 2014-12-30 ENCOUNTER — Ambulatory Visit (HOSPITAL_COMMUNITY): Payer: Medicare Other | Attending: Cardiovascular Disease

## 2014-12-30 ENCOUNTER — Other Ambulatory Visit: Payer: Self-pay

## 2014-12-30 DIAGNOSIS — I340 Nonrheumatic mitral (valve) insufficiency: Secondary | ICD-10-CM

## 2014-12-30 DIAGNOSIS — I10 Essential (primary) hypertension: Secondary | ICD-10-CM

## 2014-12-30 DIAGNOSIS — R0609 Other forms of dyspnea: Secondary | ICD-10-CM | POA: Diagnosis not present

## 2014-12-30 DIAGNOSIS — I350 Nonrheumatic aortic (valve) stenosis: Secondary | ICD-10-CM

## 2014-12-30 LAB — HEPATIC FUNCTION PANEL
ALT: 32 U/L (ref 0–53)
AST: 21 U/L (ref 0–37)
Albumin: 4.9 g/dL (ref 3.5–5.2)
Alkaline Phosphatase: 66 U/L (ref 39–117)
Bilirubin, Direct: 0.1 mg/dL (ref 0.0–0.3)
Total Bilirubin: 0.5 mg/dL (ref 0.2–1.2)
Total Protein: 7.7 g/dL (ref 6.0–8.3)

## 2014-12-30 LAB — BASIC METABOLIC PANEL
BUN: 18 mg/dL (ref 6–23)
CO2: 30 mEq/L (ref 19–32)
Calcium: 9.4 mg/dL (ref 8.4–10.5)
Chloride: 100 mEq/L (ref 96–112)
Creatinine, Ser: 1.44 mg/dL (ref 0.40–1.50)
GFR: 62.75 mL/min (ref >60.00)
Glucose, Bld: 243 mg/dL — ABNORMAL HIGH (ref 70–99)
Potassium: 3.6 mEq/L (ref 3.5–5.1)
Sodium: 138 mEq/L (ref 135–145)

## 2014-12-30 NOTE — Addendum Note (Signed)
Addended by: Eulis Foster on: 12/30/2014 12:14 PM   Modules accepted: Orders

## 2015-01-09 ENCOUNTER — Encounter: Payer: Self-pay | Admitting: Cardiology

## 2015-01-09 ENCOUNTER — Ambulatory Visit (INDEPENDENT_AMBULATORY_CARE_PROVIDER_SITE_OTHER): Payer: Medicare Other | Admitting: Cardiology

## 2015-01-09 VITALS — BP 140/98 | HR 71 | Ht 63.78 in | Wt 207.0 lb

## 2015-01-09 DIAGNOSIS — R0609 Other forms of dyspnea: Secondary | ICD-10-CM | POA: Diagnosis not present

## 2015-01-09 DIAGNOSIS — I350 Nonrheumatic aortic (valve) stenosis: Secondary | ICD-10-CM

## 2015-01-09 DIAGNOSIS — I10 Essential (primary) hypertension: Secondary | ICD-10-CM

## 2015-01-09 DIAGNOSIS — I080 Rheumatic disorders of both mitral and aortic valves: Secondary | ICD-10-CM

## 2015-01-09 DIAGNOSIS — R0600 Dyspnea, unspecified: Secondary | ICD-10-CM

## 2015-01-09 DIAGNOSIS — I5032 Chronic diastolic (congestive) heart failure: Secondary | ICD-10-CM

## 2015-01-09 DIAGNOSIS — R600 Localized edema: Secondary | ICD-10-CM

## 2015-01-09 MED ORDER — FUROSEMIDE 20 MG PO TABS: 20.0000 mg | ORAL_TABLET | Freq: Every day | ORAL | Status: DC

## 2015-01-09 MED ORDER — OMEGA-3-ACID ETHYL ESTERS 1 G PO CAPS
2.0000 g | ORAL_CAPSULE | Freq: Two times a day (BID) | ORAL | Status: DC
Start: 2015-01-09 — End: 2015-01-30

## 2015-01-09 NOTE — Progress Notes (Signed)
Patient ID: Antonio Wells, male   DOB: [birth year], 69 y.o.   MRN: [id number] Patient ID: Antonio Wells, male   DOB: 02-09-1946, 69 y.o.   MRN: [id number]    Patient Name: Antonio Wells Date of Encounter: 01/09/2015  Primary Care Provider:  [name], MD Primary Cardiologist:  [name]  Problem List   Past Medical History  Diagnosis Date  . Diabetes mellitus without complication   . Hypertension   . High cholesterol   . Gout   . Heart murmur   . Sleep apnea    Past Surgical History  Procedure Laterality Date  . Colonoscopy  2008   Allergies  Allergies  Allergen Reactions  . Lisinopril Cough  . Penicillins Other (See Comments)    Passed out  . Tramadol     Hiccups and vomiting   HPI  A pleasant 69 year old male with prior medical history of insulin-dependent diabetes mellitus known for the last 5 years, hypertension diagnosed 15 years ago, hyperlipidemia, obstructive sleep apnea, and prior history of smoking, quit 15 years ago who is coming with concern of dyspnea on exertion.the patient is currently retired and states that he used to exercise on a regular basis. Specifically he was use a stationary bike and exercise for up to an hour 3 times a week. However shows he also states that lately he hasn't been that active and noticed that he gets short of breath easily. This is a episodic sometimes he can exercise no problem and then other times he gets short of breath there quickly for example after walking one or 2 flight of stairs. He is very compliant with his medications. He quit smoking 15 years ago. He was prescribed a CPAP machine for obstructive sleep apnea but is not using it. He denies any chest pain left arm pain jaw pain or back pain, no palpitations or syncope no orthopnea or lower extremity edema.  The patient also states that his blood pressure has been uncontrolled lately.  Since the last visit patient blood pressure has been under much  better control. He states that his shortness of breath has significantly improved since the blood pressure has been better controlled. He denies any chest pain palpitation or syncope.he denies any side effect Bystolic.  01/09/2015  -  He is compliant with his medicine but doesn't exercise. There eis also room for improvement in his diet. Denies orthopnea, PND, chest pain, he has mild LE edema around the ankles. He started to take lasix 20 mg po BID with some improvement.  He denies any claudications , palpitations.   Home Medications  Prior to Admission medications   Medication Sig Start Date End Date Taking? Authorizing Provider  aspirin EC 81 MG tablet Take 1 tablet (81 mg total) by mouth daily. 05/31/13  Yes Lauren Burnetta Sabin, PA-C  colchicine 0.6 MG tablet Take 1-2 tablets (0.6-1.2 mg total) by mouth daily as needed (for gout flare). 2 po at onset of flair and then repeat in 1 h for acute gout attack then 1 po qd until pain free 05/31/13  Yes Lauren Burnetta Sabin, PA-C  felodipine (PLENDIL) 10 MG 24 hr tablet Take 1 tablet (10 mg total) by mouth daily. 07/27/13  Yes Mancel Bale, PA-C  glipiZIDE (GLUCOTROL) 10 MG tablet Take 1 tablet (10 mg total) by mouth 2 (two) times daily before a meal. 07/27/13  Yes Mancel Bale, PA-C  glucose blood test strip Use as directed 05/31/13  Yes Lauren  Burnetta Sabin, PA-C  losartan-hydrochlorothiazide (HYZAAR) 100-12.5 MG per tablet Take 1 tablet by mouth daily. 10/24/13  Yes [name], MD  lovastatin (MEVACOR) 20 MG tablet Take 1 tablet (20 mg total) by mouth at bedtime. 07/27/13  Yes Mancel Bale, PA-C  metFORMIN (GLUCOPHAGE) 500 MG tablet Take 1 tablet (500 mg total) by mouth 2 (two) times daily with a meal. 07/27/13  Yes Mancel Bale, PA-C  metoprolol succinate (TOPROL-XL) 100 MG 24 hr tablet Take 1 tablet (100 mg total) by mouth daily. 07/27/13  Yes Mancel Bale, PA-C    Family History  Family History  Problem Relation Age of Onset  . Hypertension Mother   .  Hypertension Father   . Cancer Brother     lung  . Stroke Brother   . Colon cancer Neg Hx     Social History  History   Social History  . Marital Status: Married    Spouse Name: Antonio Wells  . Number of Children: 3  . Years of Education: 16   Occupational History  . Musician    Social History Main Topics  . Smoking status: Former Smoker -- 2.00 packs/day for 15 years    Quit date: 07/26/1998  . Smokeless tobacco: Never Used  . Alcohol Use: Yes     Comment: occasionally  . Drug Use: No  . Sexual Activity: Yes   Other Topics Concern  . Not on file   Social History Narrative   Patient is married Antonio Wells) and lives at home with his wife.   Patient has three adult children.   Patient is retired.   Patient is a Research officer, political party.   Patient is right-handed.   Patient drinks one cup of coffee daily and 2-3 sodas per day.           Review of Systems, as per HPI, otherwise negative General:  No chills, fever, night sweats or weight changes.  Cardiovascular:  No chest pain, dyspnea on exertion, edema, orthopnea, palpitations, paroxysmal nocturnal dyspnea. Dermatological: No rash, lesions/masses Respiratory: No cough, dyspnea Urologic: No hematuria, dysuria Abdominal:   No nausea, vomiting, diarrhea, bright red blood per rectum, melena, or hematemesis Neurologic:  No visual changes, wkns, changes in mental status. All other systems reviewed and are otherwise negative except as noted above.  Physical Exam  There were no vitals taken for this visit.  General: Pleasant, NAD Psych: Normal affect. Neuro: Alert and oriented X 3. Moves all extremities spontaneously. HEENT: Normal  Neck: Supple without bruits or JVD. Lungs:  Resp regular and unlabored, CTA. Heart: RRR no s3, s4, or 3/6 systolic murmur. Abdomen: Soft, non-tender, non-distended, BS + x 4.  Extremities: No clubbing, cyanosis or edema. DP/PT/Radials 2+ and equal bilaterally.  Labs:  No results for input(s): CKTOTAL,  CKMB, TROPONINI in the last 72 hours. Lab Results  Component Value Date   WBC 4.1 11/22/2014   HGB 13.3 11/22/2014   HCT 39.9 11/22/2014   MCV 92.8 11/22/2014   PLT 223 11/22/2014    No results found for: DDIMER Invalid input(s): POCBNP    Component Value Date/Time   NA 138 12/30/2014 1214   K 3.6 12/30/2014 1214   CL 100 12/30/2014 1214   CO2 30 12/30/2014 1214   GLUCOSE 243* 12/30/2014 1214   BUN 18 12/30/2014 1214   CREATININE 1.44 12/30/2014 1214   CREATININE 1.50* 11/22/2014 1855   CALCIUM 9.4 12/30/2014 1214   PROT 7.7 12/30/2014 1214   ALBUMIN 4.9 12/30/2014 1214  AST 21 12/30/2014 1214   ALT 32 12/30/2014 1214   ALKPHOS 66 12/30/2014 1214   BILITOT 0.5 12/30/2014 1214   GFRNONAA 56* 05/24/2014 0950   GFRNONAA 52* 02/20/2014 1041   GFRAA 65 05/24/2014 0950   GFRAA 61* 02/20/2014 1041   Lab Results  Component Value Date   CHOL 149 11/22/2014   HDL 35* 11/22/2014   LDLCALC 57 11/22/2014   TRIG 286* 11/22/2014    Accessory Clinical Findings  Echocardiogram -12/20/2013   - Left ventricle: The cavity size was normal. There was mild concentric hypertrophy. Systolic function was normal. The estimated ejection fraction was in the range of 60% to 65%. Wall motion was normal; there were no regional wall motion abnormalities. Doppler parameters are consistent with abnormal left ventricular relaxation (grade 1 diastolic dysfunction). The E/e&' ratio is >15, suggesting elevated LV filling pressure. - Aortic valve: Trileaflet; mildly calcified leaflets. There is mild aortic stenosis. Peak and mean gradients of 18 and 13 mmHg, respectively. Based on an LVOT diameter of 2.2, the calculated AVA is 1.5-1.6 cm2. - Mitral valve: There was mild regurgitation. - Atrial septum: No defect or patent foramen ovale was identified. - Tricuspid valve: There was mild regurgitation. - Pulmonary arteries: PA peak pressure: 37 mm Hg (S). Mild pulmonary  hypertension.  Impressions:  - LVEF of 60-65%, mild concentric LVH. Diastolic dysfunction with elevated LV filling pressure. Mild aortic stenosis - AVA around 1.6 cm2. Mild pulmonary hypertension - RVSP 37 mmHg.  ECG - Sinus rhythm, nonspecific ST-T wave several days.  Lexiscan nuclear stress test; 12/21/13 Quantitative Gated Spect Images  QGS EDV: 106 ml  QGS ESV: 50 ml  Impression  Exercise Capacity: Lexiscan with low level exercise.  BP Response: Normal blood pressure response.  Clinical Symptoms: There is dyspnea.  ECG Impression: Nonspecific T wave abnormality during exercise and Lexiscan infusion and in recovery  Comparison with Prior Nuclear Study: No images to compare  Overall Impression: Normal stress nuclear study.  LV Ejection Fraction: 53%. LV Wall Motion: NL LV Function; NL Wall Motion   Assessment & Plan  69 year old male with multiple risk factors for coronary artery disease including prior smoking, obesity, insulin-dependent diabetes mellitus, hyperlipidemia, uncontrolled hypertension and untreated obstructive sleep apnea who is coming with concerns of   1. Dyspnea on exertion. EKG shows nonspecific ST-T wave abnormalities.  the patient only performed a lower of exercise and a stress test was converted to [employer]. However it showed no prior scar and no ischemia. It showed normal blood pressure response to stress. Since the patient is deconditioned we advised on proper exercise and his frequency again.  2. Hypertension -  Controlled now , we will continue the same regimen. Echocardiogram showed mild LVH impaired for observation and mild pulmonary hypertension.   3. Lower extremity edema -  Episodic, agree with lasix 20 mg po BID, we will add compression stocking for the night. Crea 1.44 - baseline.  4. Murmur - mild mitral regurgitation and mild aortic stenosis, but will follow with echocardiogram in 1 year , prior to next visit.  5. Hyperlipidemia - TAG 266, add  LOvaza (fish oil) 4 g daily.  Followup in 6 months.   [name], MD, [facility] 01/09/2015, 10:29 AM

## 2015-01-09 NOTE — Patient Instructions (Addendum)
Medication Instructions:   TAKE LASIX 20 MG ONCE DAILY  TAKE LOVAZA 4 GRAMS PER DAY.  PLEASE FOLLOW INSTRUCTIONS ON THE BOTTLE. (YOU WILL TAKE 2 CAPSULES 2 TIMES A DAY TO TOTAL 4 GRAMS)     Your physician has requested that you have an echocardiogram. Echocardiography is a painless test that uses sound waves to create images of your heart. It provides your doctor with information about the size and shape of your heart and how well your heart's chambers and valves are working. This procedure takes approximately one hour. There are no restrictions for this procedure.  PLEASE HAVE THIS SCHEDULED PRIOR TO YOUR 6 MONTH FOLLOW-UP APPOINTMENT WITH DR Meda Coffee    --PLEASE WEAR YOUR COMPRESSION STOCKINGS AT NIGHTIME/SEDENTARY --    Follow-Up:  Your physician wants you to follow-up in: [place] will receive a reminder letter in the mail two months in advance. If you don't receive a letter, please call our office to schedule the follow-up appointment.  PLEASE HAVE ECHO PRIOR TO THIS VISIT

## 2015-01-16 ENCOUNTER — Other Ambulatory Visit: Payer: Self-pay | Admitting: Family Medicine

## 2015-01-30 ENCOUNTER — Ambulatory Visit (INDEPENDENT_AMBULATORY_CARE_PROVIDER_SITE_OTHER): Payer: Medicare Other | Admitting: Emergency Medicine

## 2015-01-30 VITALS — BP 144/76 | HR 76 | Temp 98.2°F | Resp 16 | Ht 63.75 in | Wt 206.2 lb

## 2015-01-30 DIAGNOSIS — J0140 Acute pansinusitis, unspecified: Secondary | ICD-10-CM

## 2015-01-30 DIAGNOSIS — J209 Acute bronchitis, unspecified: Secondary | ICD-10-CM | POA: Diagnosis not present

## 2015-01-30 DIAGNOSIS — I10 Essential (primary) hypertension: Secondary | ICD-10-CM

## 2015-01-30 MED ORDER — HYDROCOD POLST-CPM POLST ER 10-8 MG/5ML PO SUER: 5.0000 mL | Freq: Two times a day (BID) | ORAL | Status: DC

## 2015-01-30 MED ORDER — LEVOFLOXACIN 500 MG PO TABS: 500.0000 mg | ORAL_TABLET | Freq: Every day | ORAL | Status: AC

## 2015-01-30 NOTE — Progress Notes (Signed)
Subjective:  Patient ID: Antonio Wells, male    DOB: 1945-08-25  Age: 69 y.o. MRN: [id number]  CC: Cough   HPI HANDSOME ANGLIN presents  With nasal congestion and postnasal drainage. He has a cough. His cough is productive of mucopurulent sputum he hasno shortness of breath. He does have some wheezing at night.he has no fever or chills. No nausea or vomiting. No stool change. No rash. No sore throat. Has had no improvement with over-the-counter medication. They'll for a week.  History Cecil has a past medical history of Diabetes mellitus without complication; Hypertension; High cholesterol; Gout; Heart murmur; and Sleep apnea.   He has past surgical history that includes Colonoscopy (2008).   His  family history includes Cancer in his brother; Hypertension in his father and mother; Stroke in his brother. There is no history of Colon cancer.  He   reports that he quit smoking about 16 years ago. He has never used smokeless tobacco. He reports that he drinks alcohol. He reports that he does not use illicit drugs.  Outpatient Prescriptions Prior to Visit  Medication Sig Dispense Refill  . amLODipine (NORVASC) 10 MG tablet Take 1 tablet (10 mg total) by mouth daily. 90 tablet 1  . aspirin EC 81 MG tablet Take 1 tablet (81 mg total) by mouth daily. 30 tablet 0  . colchicine 0.6 MG tablet Take 1-2 tablets (0.6-1.2 mg total) by mouth daily as needed (for gout flare). 2 po at onset of flair and then repeat in 1 h for acute gout attack then 1 po qd until pain free 30 tablet 0  . furosemide (LASIX) 20 MG tablet Take 1 tablet (20 mg total) by mouth daily. 90 tablet 3  . glipiZIDE (GLUCOTROL) 10 MG tablet Take 10 mg by mouth daily before breakfast.    . losartan (COZAAR) 100 MG tablet Take 1 tablet (100 mg total) by mouth daily. 90 tablet 1  . lovastatin (MEVACOR) 20 MG tablet TAKE ONE TABLET BY MOUTH AT BEDTIME 90 tablet 0  . metFORMIN (GLUCOPHAGE) 500 MG tablet TAKE 1 TABLET (500 MG  TOTAL) BY MOUTH 2 (TWO) TIMES DAILY WITH A MEAL. 60 tablet 0  . metFORMIN (GLUCOPHAGE) 500 MG tablet Take 1 tablet (500 mg total) by mouth 2 (two) times daily with a meal. PATIENT NEEDS OFFICE VISIT FOR ADDITIONAL REFILLS 180 tablet 0  . metoprolol (LOPRESSOR) 50 MG tablet TAKE 1 TABLET BY MOUTH TWICE A DAY 60 tablet 2  . methylphenidate 10 MG ER tablet Take 1 tablet (10 mg total) by mouth daily. 90 tablet 0  . omega-3 acid ethyl esters (LOVAZA) 1 G capsule Take 2 capsules (2 g total) by mouth 2 (two) times daily. 180 capsule 3   No facility-administered medications prior to visit.    History   Social History  . Marital Status: Married    Spouse Name: [name]  . Number of Children: 3  . Years of Education: 16   Occupational History  . Musician    Social History Main Topics  . Smoking status: Former Smoker -- 2.00 packs/day for 15 years    Quit date: 07/26/1998  . Smokeless tobacco: Never Used  . Alcohol Use: Yes     Comment: occasionally  . Drug Use: No  . Sexual Activity: Yes   Other Topics Concern  . None   Social History Narrative   Patient is married [name]) and lives at home with his wife.   Patient has three adult  children.   Patient is retired.   Patient is a Research officer, political party.   Patient is right-handed.   Patient drinks one cup of coffee daily and 2-3 sodas per day.           Review of Systems  Constitutional: Negative for fever, chills and appetite change.  HENT: Positive for congestion, postnasal drip and rhinorrhea. Negative for ear pain, sinus pressure and sore throat.   Eyes: Negative for pain and redness.  Respiratory: Positive for cough and wheezing. Negative for shortness of breath.   Cardiovascular: Negative for leg swelling.  Gastrointestinal: Negative for nausea, vomiting, abdominal pain, diarrhea, constipation and blood in stool.  Endocrine: Negative for polyuria.  Genitourinary: Negative for dysuria, urgency, frequency and flank pain.    Musculoskeletal: Negative for gait problem.  Skin: Negative for rash.  Neurological: Negative for weakness and headaches.  Psychiatric/Behavioral: Negative for confusion and decreased concentration. The patient is not nervous/anxious.     Objective:  BP 144/76 mmHg  Pulse 76  Temp(Src) 98.2 F (36.8 C) (Oral)  Resp 16  Ht 5' 3.75" (1.619 m)  Wt 206 lb 4 oz (93.554 kg)  BMI 35.69 kg/m2  SpO2 98%  Physical Exam  Constitutional: He is oriented to person, place, and time. He appears well-developed and well-nourished. No distress.  HENT:  Head: Normocephalic and atraumatic.  Right Ear: External ear normal.  Left Ear: External ear normal.  Nose: Nose normal.  Eyes: Conjunctivae and EOM are normal. Pupils are equal, round, and reactive to light. No scleral icterus.  Neck: Normal range of motion. Neck supple. No tracheal deviation present.  Cardiovascular: Normal rate, regular rhythm and normal heart sounds.   Pulmonary/Chest: Effort normal. No respiratory distress. He has no wheezes. He has no rales.  Abdominal: He exhibits no mass. There is no tenderness. There is no rebound and no guarding.  Musculoskeletal: He exhibits no edema.  Lymphadenopathy:    He has no cervical adenopathy.  Neurological: He is alert and oriented to person, place, and time. Coordination normal.  Skin: Skin is warm and dry. No rash noted.  Psychiatric: He has a normal mood and affect. His behavior is normal.      Assessment & Plan:   Elih was seen today for cough.  Diagnoses and all orders for this visit:  Acute pansinusitis, recurrence not specified  Acute bronchitis, unspecified organism  Essential hypertension  Other orders -     chlorpheniramine-HYDROcodone (TUSSIONEX PENNKINETIC ER) 10-8 MG/5ML SUER; Take 5 mLs by mouth 2 (two) times daily. -     levofloxacin (LEVAQUIN) 500 MG tablet; Take 1 tablet (500 mg total) by mouth daily.   I have discontinued Mr. Ingrum's methylphenidate and  omega-3 acid ethyl esters. I am also having him start on chlorpheniramine-HYDROcodone and levofloxacin. Additionally, I am having him maintain his colchicine, aspirin EC, glipiZIDE, lovastatin, amLODipine, losartan, metoprolol, metFORMIN, furosemide, and metFORMIN.  Meds ordered this encounter  Medications  . chlorpheniramine-HYDROcodone (TUSSIONEX PENNKINETIC ER) 10-8 MG/5ML SUER    Sig: Take 5 mLs by mouth 2 (two) times daily.    Dispense:  60 mL    Refill:  0  . levofloxacin (LEVAQUIN) 500 MG tablet    Sig: Take 1 tablet (500 mg total) by mouth daily.    Dispense:  10 tablet    Refill:  0    Appropriate red flag conditions were discussed with the patient as well as actions that should be taken.  Patient expressed his understanding.  Follow-up: Return  if symptoms worsen or fail to improve.  Roselee Culver, MD

## 2015-01-30 NOTE — Patient Instructions (Signed)

## 2015-02-10 ENCOUNTER — Other Ambulatory Visit: Payer: Self-pay | Admitting: Family Medicine

## 2015-02-28 ENCOUNTER — Encounter: Payer: Self-pay | Admitting: *Deleted

## 2015-03-06 DIAGNOSIS — M170 Bilateral primary osteoarthritis of knee: Secondary | ICD-10-CM | POA: Diagnosis not present

## 2015-03-08 ENCOUNTER — Other Ambulatory Visit: Payer: Self-pay | Admitting: Family Medicine

## 2015-03-17 ENCOUNTER — Other Ambulatory Visit: Payer: Self-pay | Admitting: Family Medicine

## 2015-04-18 ENCOUNTER — Other Ambulatory Visit: Payer: Self-pay | Admitting: Family Medicine

## 2015-04-20 ENCOUNTER — Other Ambulatory Visit: Payer: Self-pay | Admitting: Family Medicine

## 2015-05-05 ENCOUNTER — Other Ambulatory Visit: Payer: Self-pay | Admitting: Cardiology

## 2015-05-14 ENCOUNTER — Other Ambulatory Visit: Payer: Self-pay | Admitting: Emergency Medicine

## 2015-05-16 ENCOUNTER — Other Ambulatory Visit: Payer: Self-pay | Admitting: Family Medicine

## 2015-05-19 ENCOUNTER — Telehealth: Payer: Self-pay | Admitting: Family Medicine

## 2015-05-19 NOTE — Telephone Encounter (Signed)
SPOKE WITH PATIENT ABOUT COMING IN FOR HIS DIABETES CHECK UP.  HE WILL COME IN TO SEE DR Carlota Raspberry ON October 31 AT 315.

## 2015-05-22 ENCOUNTER — Other Ambulatory Visit: Payer: Self-pay | Admitting: Family Medicine

## 2015-05-26 ENCOUNTER — Ambulatory Visit (INDEPENDENT_AMBULATORY_CARE_PROVIDER_SITE_OTHER): Payer: Medicare Other | Admitting: Family Medicine

## 2015-05-26 ENCOUNTER — Encounter: Payer: Self-pay | Admitting: Family Medicine

## 2015-05-26 VITALS — BP 130/74 | HR 82 | Temp 99.4°F | Resp 16 | Ht 62.75 in | Wt 203.8 lb

## 2015-05-26 DIAGNOSIS — I10 Essential (primary) hypertension: Secondary | ICD-10-CM

## 2015-05-26 DIAGNOSIS — Z9989 Dependence on other enabling machines and devices: Secondary | ICD-10-CM

## 2015-05-26 DIAGNOSIS — E1165 Type 2 diabetes mellitus with hyperglycemia: Secondary | ICD-10-CM | POA: Diagnosis not present

## 2015-05-26 DIAGNOSIS — R609 Edema, unspecified: Secondary | ICD-10-CM | POA: Diagnosis not present

## 2015-05-26 DIAGNOSIS — IMO0001 Reserved for inherently not codable concepts without codable children: Secondary | ICD-10-CM

## 2015-05-26 DIAGNOSIS — G4733 Obstructive sleep apnea (adult) (pediatric): Secondary | ICD-10-CM | POA: Diagnosis not present

## 2015-05-26 LAB — GLUCOSE, POCT (MANUAL RESULT ENTRY): POC Glucose: 200 mg/dl — AB (ref 70–99)

## 2015-05-26 LAB — BASIC METABOLIC PANEL
BUN: 16 mg/dL (ref 7–25)
CHLORIDE: 103 mmol/L (ref 98–110)
CO2: 26 mmol/L (ref 20–31)
Calcium: 8.9 mg/dL (ref 8.6–10.3)
Creat: 1.4 mg/dL — ABNORMAL HIGH (ref 0.70–1.25)
GLUCOSE: 219 mg/dL — AB (ref 65–99)
POTASSIUM: 4.3 mmol/L (ref 3.5–5.3)
Sodium: 140 mmol/L (ref 135–146)

## 2015-05-26 LAB — POCT GLYCOSYLATED HEMOGLOBIN (HGB A1C): Hemoglobin A1C: 9.2

## 2015-05-26 MED ORDER — METFORMIN HCL 850 MG PO TABS: ORAL_TABLET | ORAL | Status: DC

## 2015-05-26 MED ORDER — LOVASTATIN 20 MG PO TABS
ORAL_TABLET | ORAL | Status: DC
Start: 2015-05-26 — End: 2016-01-08

## 2015-05-26 MED ORDER — GLIPIZIDE 10 MG PO TABS: ORAL_TABLET | ORAL | Status: DC

## 2015-05-26 NOTE — Patient Instructions (Signed)
Cut out sugar containing beverages/sodas. Decrease fast food.  Blood sugar too high, so will increase metformin to 850mg  twice per day, but diet changes will also be needed.  If not controlled in 3 months, will need to add another diabetes medicine.  Keep follow up with cardiology.  Decrease salt in diet (fast food is likely a big culprit) to see if this helps with swelling. continue lasix at same dose for now.  Return to the clinic or go to the nearest emergency room if any of your symptoms worsen or new symptoms occur. You should receive a call or letter about your lab results within the next week to 10 days.    Low-Sodium Eating Plan Sodium raises blood pressure and causes water to be held in the body. Getting less sodium from food will help lower your blood pressure, reduce any swelling, and protect your heart, liver, and kidneys. We get sodium by adding salt (sodium chloride) to food. Most of our sodium comes from canned, boxed, and frozen foods. Restaurant foods, fast foods, and pizza are also very high in sodium. Even if you take medicine to lower your blood pressure or to reduce fluid in your body, getting less sodium from your food is important. WHAT IS MY PLAN? Most people should limit their sodium intake to 2,300 mg a day. Your health care provider recommends that you limit your sodium intake to __________ a day.  WHAT DO I NEED TO KNOW ABOUT THIS EATING PLAN? For the low-sodium eating plan, you will follow these general guidelines:  Choose foods with a % Daily Value for sodium of less than 5% (as listed on the food label).   Use salt-free seasonings or herbs instead of table salt or sea salt.   Check with your health care provider or pharmacist before using salt substitutes.   Eat fresh foods.  Eat more vegetables and fruits.  Limit canned vegetables. If you do use them, rinse them well to decrease the sodium.   Limit cheese to 1 oz (28 g) per day.   Eat lower-sodium  products, often labeled as "lower sodium" or "no salt added."  Avoid foods that contain monosodium glutamate (MSG). MSG is sometimes added to [place] food and some canned foods.  Check food labels (Nutrition Facts labels) on foods to learn how much sodium is in one serving.  Eat more home-cooked food and less restaurant, buffet, and fast food.  When eating at a restaurant, ask that your food be prepared with less salt, or no salt if possible.  HOW DO I READ FOOD LABELS FOR SODIUM INFORMATION? The Nutrition Facts label lists the amount of sodium in one serving of the food. If you eat more than one serving, you must multiply the listed amount of sodium by the number of servings. Food labels may also identify foods as:  Sodium free--Less than 5 mg in a serving.  Very low sodium--35 mg or less in a serving.  Low sodium--140 mg or less in a serving.  Light in sodium--50% less sodium in a serving. For example, if a food that usually has 300 mg of sodium is changed to become light in sodium, it will have 150 mg of sodium.  Reduced sodium--25% less sodium in a serving. For example, if a food that usually has 400 mg of sodium is changed to reduced sodium, it will have 300 mg of sodium. WHAT FOODS CAN I EAT? Grains Low-sodium cereals, including oats, puffed wheat and rice, and shredded wheat  cereals. Low-sodium crackers. Unsalted rice and pasta. Lower-sodium bread.  Vegetables Frozen or fresh vegetables. Low-sodium or reduced-sodium canned vegetables. Low-sodium or reduced-sodium tomato sauce and paste. Low-sodium or reduced-sodium tomato and vegetable juices.  Fruits Fresh, frozen, and canned fruit. Fruit juice.  Meat and Other Protein Products Low-sodium canned tuna and salmon. Fresh or frozen meat, poultry, seafood, and fish. Lamb. Unsalted nuts. Dried beans, peas, and lentils without added salt. Unsalted canned beans. Homemade soups without salt. Eggs.  Dairy Milk. Soy milk.  Ricotta cheese. Low-sodium or reduced-sodium cheeses. Yogurt.  Condiments Fresh and dried herbs and spices. Salt-free seasonings. Onion and garlic powders. Low-sodium varieties of mustard and ketchup. Fresh or refrigerated horseradish. Lemon juice.  Fats and Oils Reduced-sodium salad dressings. Unsalted butter.  Other Unsalted popcorn and pretzels.  The items listed above may not be a complete list of recommended foods or beverages. Contact your dietitian for more options.  WHAT FOODS ARE NOT RECOMMENDED? Grains Instant hot cereals. Bread stuffing, pancake, and biscuit mixes. Croutons. Seasoned rice or pasta mixes. Noodle soup cups. Boxed or frozen macaroni and cheese. Self-rising flour. Regular salted crackers. Vegetables Regular canned vegetables. Regular canned tomato sauce and paste. Regular tomato and vegetable juices. Frozen vegetables in sauces. Salted [place] fries. Olives. Angie Fava. Relishes. Sauerkraut. Salsa. Meat and Other Protein Products Salted, canned, smoked, spiced, or pickled meats, seafood, or fish. Bacon, ham, sausage, hot dogs, corned beef, chipped beef, and packaged luncheon meats. Salt pork. Jerky. Pickled herring. Anchovies, regular canned tuna, and sardines. Salted nuts. Dairy Processed cheese and cheese spreads. Cheese curds. Blue cheese and cottage cheese. Buttermilk.  Condiments Onion and garlic salt, seasoned salt, table salt, and sea salt. Canned and packaged gravies. Worcestershire sauce. Tartar sauce. Barbecue sauce. Teriyaki sauce. Soy sauce, including reduced sodium. Steak sauce. Fish sauce. Oyster sauce. Cocktail sauce. Horseradish that you find on the shelf. Regular ketchup and mustard. Meat flavorings and tenderizers. Bouillon cubes. Hot sauce. Tabasco sauce. Marinades. Taco seasonings. Relishes. Fats and Oils Regular salad dressings. Salted butter. Margarine. Ghee. Bacon fat.  Other Potato and tortilla chips. Corn chips and puffs. Salted popcorn  and pretzels. Canned or dried soups. Pizza. Frozen entrees and pot pies.  The items listed above may not be a complete list of foods and beverages to avoid. Contact your dietitian for more information.   This information is not intended to replace advice given to you by your health care provider. Make sure you discuss any questions you have with your health care provider.   Document Released: 01/01/2002 Document Revised: 08/02/2014 Document Reviewed: 05/16/2013 Elsevier Interactive Patient Education [employer].

## 2015-05-26 NOTE — Progress Notes (Signed)
Subjective:    Patient ID: Antonio Wells, male    DOB: 03-12-1946, 69 y.o.   MRN: [id number] This chart was scribed for [name], MD by Zola Button, Medical Scribe. This patient was seen in Room 26 and the patient's care was started at 3:37 PM.    HPI HPI Comments: Antonio Wells is a 69 y.o. male with a history of DM and hypertension who presents to the [facility] for a follow-up. Patient has been doing okay overall. He is concerned with the amount of medications he is taking.  Diabetes:  Last seen by Dr. Brigitte Pulse in April this year for multiple concerns. Diabetes was uncontrolled at that time with A1c of 9.6. He had been checking his blood sugars before, but had not been checking recently; he had been getting blood sugars in the 140-206 range in May, but he has not checked his blood sugar in the past 2 months. He is on glipizide and metformin. He also takes aspirin 81 mg. He believes he had his eyes checked within the past year and does see a dentist regularly. Patient denies symptomatic lows. Lab Results  Component Value Date   MICROALBUR 7.3* 05/24/2014    Hypertension: Noted to have some peripheral edema at April visit with Dr. Brigitte Pulse. Improves in the morning, worse at night. He was taking Lasix 20 mg at that time. Had not been taking Plendil that was used for claudication in the past. Appointment with cardiology June 16th. Apparently had some DOE at that visit. He had non-specific ST/T waves on his EKG. He had a nuclear stress test May 2015, no scar and no ischemia. Thought to be de-condition, so advised to exercise. He did have an echocardiogram May 2015; mild LVH, mild pulmonary hypertension. Was continued on Lasix 20 mg twice a day, compression stockings at night. Follow-up in 1 year for repeat cardiogram for mild MR and aortic stenosis. Follow-up with cardiology in 6 months. Has had some creatinines up to 1.5 back in April. Patient states he has noticed more ankle  swelling. This is noticed more when he is not moving; he is a Therapist, nutritional and stands a lot. He has been using the compression stockings occasionally. He is taking the Lasix once a day. He has not checked his blood pressure recently, but had been getting blood pressure readings of 130s-140s/80s-90s. Patient does salt his food and admits to eating out 1-2 times a week and eating fast food 3-4 times a week. He does not drink much alcohol or eat much frozen food. He did begin exercising at the [facility] through the [employer] program; he has been exercising 3 times a week and plans to increase activity. Lab Results  Component Value Date   CREATININE 1.44 12/30/2014    Hyperlipidemia: They added Lovaza 4 g daily at June cardiology visit.  Lab Results  Component Value Date   CHOL 149 11/22/2014   HDL 35* 11/22/2014   LDLCALC 57 11/22/2014   TRIG 286* 11/22/2014   CHOLHDL 4.3 11/22/2014    OSA on CPAP, hypersomnia: Followed by Dr. Brett Fairy, last visit in May. Continued on CPAP. With daytime sleepiness, the Nuvigil was not covered by insurance. Possible consideration of Ritalin or Adderall, but difficult due to his history. Plan on following up in 1 year.  Health Maintenance: He declines flu shot.  Patient Active Problem List   Diagnosis Date Noted  . Aortic stenosis 01/09/2015  . OSA on CPAP 12/12/2014  .  Hypersomnia with sleep apnea 12/12/2014  . Obstructive sleep apnea (adult) (pediatric) 02/04/2014  . 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 Obesity 02/04/2014  . Mitral regurgitation and aortic stenosis 01/02/2014  . DOE (dyspnea on exertion) 11/26/2013  . Gout 08/12/2011  . Hypertension 08/12/2011  . High cholesterol 08/12/2011  . OSA (obstructive sleep apnea) 08/12/2011  . Diabetes type 2, uncontrolled ([place]) 08/12/2011   Past Medical History  Diagnosis Date  . Diabetes mellitus without complication ([place])   . Hypertension   . High cholesterol   . Gout   . Heart murmur   . Sleep apnea     Past Surgical History  Procedure Laterality Date  . Colonoscopy  2008   Allergies  Allergen Reactions  . Penicillins Other (See Comments)    Passed out  . Tramadol Other (See Comments)    Hiccups and vomiting  . Cortisone     Hiccups and vomiting  . Lisinopril Cough   Prior to Admission medications   Medication Sig Start Date End Date Taking? Authorizing Provider  amLODipine (NORVASC) 10 MG tablet TAKE 1 TABLET (10 MG TOTAL) BY MOUTH DAILY. 05/05/15   [name], MD  aspirin EC 81 MG tablet Take 1 tablet (81 mg total) by mouth daily. 05/31/13   Harvie Heck, PA-C  chlorpheniramine-HYDROcodone (TUSSIONEX PENNKINETIC ER) 10-8 MG/5ML SUER Take 5 mLs by mouth 2 (two) times daily. 01/30/15   Roselee Culver, MD  colchicine 0.6 MG tablet Take 1-2 tablets (0.6-1.2 mg total) by mouth daily as needed (for gout flare). 2 po at onset of flair and then repeat in 1 h for acute gout attack then 1 po qd until pain free 05/31/13   Harvie Heck, PA-C  furosemide (LASIX) 20 MG tablet Take 1 tablet (20 mg total) by mouth daily. 01/09/15   [name], MD  glipiZIDE (GLUCOTROL) 10 MG tablet TAKE 1 TABLET BY MOUTH TWICE A DAY BEFORE MEALS.  "OFFICE VISIT NEEDED" 05/14/15   Roselee Culver, MD  losartan (COZAAR) 100 MG tablet TAKE 1 TABLET (100 MG TOTAL) BY MOUTH DAILY. 05/05/15   [name], MD  lovastatin (MEVACOR) 20 MG tablet TAKE 1 TABLET BY MOUTH EVERY DAY AT BEDTIME.  "OV NEEDED" 03/18/15   Harrison Mons, PA-C  metFORMIN (GLUCOPHAGE) 500 MG tablet TAKE 1 TABLET (500 MG TOTAL) BY MOUTH 2 (TWO) TIMES DAILY WITH A MEAL. 12/21/14   Chelle Jeffery, PA-C  metFORMIN (GLUCOPHAGE) 500 MG tablet TAKE 1 TABLET BY MOUTH TWICE A DAY WITH A MEAL 05/23/15   Jaynee Eagles, PA-C  metoprolol (LOPRESSOR) 50 MG tablet TAKE 1 TABLET BY MOUTH TWICE A DAY  "OV NEEDED" 03/08/15   Mancel Bale, PA-C   Social History   Social History  . Marital Status: Married    Spouse Name: [name]  . Number of  Children: 3  . Years of Education: 16   Occupational History  . Musician    Social History Main Topics  . Smoking status: Former Smoker -- 2.00 packs/day for 15 years    Quit date: 07/26/1998  . Smokeless tobacco: Never Used  . Alcohol Use: Yes     Comment: occasionally  . Drug Use: No  . Sexual Activity: Yes   Other Topics Concern  . Not on file   Social History Narrative   Patient is married [name]) and lives at home with his wife.   Patient has three adult children.   Patient is retired.   Patient is a Secretary/administrator  grad.   Patient is right-handed.   Patient drinks one cup of coffee daily and 2-3 sodas per day.           Review of Systems  Cardiovascular: Positive for leg swelling.       Objective:   Physical Exam  Constitutional: He is oriented to person, place, and time. He appears well-developed and well-nourished.  HENT:  Head: Normocephalic and atraumatic.  Eyes: EOM are normal. Pupils are equal, round, and reactive to light.  Neck: No JVD present. Carotid bruit is not present.  Cardiovascular: Normal rate, regular rhythm and normal heart sounds.   No murmur heard. Pulmonary/Chest: Effort normal and breath sounds normal. He has no rales.  Musculoskeletal: He exhibits edema. He exhibits no tenderness.  1+ pedal edema extends to just the lower 1/3 of the tibia. Calves non-tender.  Neurological: He is alert and oriented to person, place, and time.  Skin: Skin is warm and dry.  Psychiatric: He has a normal mood and affect.  Vitals reviewed.  Results for orders placed or performed in visit on 05/26/15  POCT glucose (manual entry)  Result Value Ref Range   POC Glucose 200 (A) 70 - 99 mg/dl  POCT glycosylated hemoglobin (Hb A1C)  Result Value Ref Range   Hemoglobin A1C 9.2       Filed Vitals:   05/26/15 1524  BP: 130/74  Pulse: 82  Temp: 99.4 F (37.4 C)  TempSrc: Oral  Resp: 16  Height: 5' 2.75" (1.594 m)  Weight: 203 lb 12.8 oz (92.443 kg)  SpO2:  97%       Assessment & Plan:   Antonio Wells is a 69 y.o. male Uncontrolled type 2 diabetes mellitus without complication, without long-term current use of insulin ([place]) - Plan: POCT glucose (manual entry), POCT glycosylated hemoglobin (Hb A1C)  - uncontrolled.  Will try initial increase in metformin, but based oin A1c - suspect additional agent such as Invokana may be needed. He would like to try just metformin change initially. Advised to cut out sugar containing beverages. Recheck in next 3 months.   Essential hypertension - Plan: Basic metabolic panel  - controlled in office. No med changes. Keep follow up with cardiology.   Peripheral edema - Plan: Basic metabolic panel  -followed by cardiology, intolerant to compressive stockings. Continue lasix, and follow up with cardiology, but could also try decrease in sodium in diet.   OSA on CPAP  - continue CPAP.   Meds ordered this encounter  Medications  . glipiZIDE (GLUCOTROL) 10 MG tablet    Sig: TAKE 1 TABLET BY MOUTH TWICE A DAY BEFORE MEALS.    Dispense:  180 tablet    Refill:  1  . lovastatin (MEVACOR) 20 MG tablet    Sig: TAKE 1 TABLET BY MOUTH EVERY DAY AT BEDTIME.    Dispense:  90 tablet    Refill:  1  . metFORMIN (GLUCOPHAGE) 850 MG tablet    Sig: TAKE 1 TABLET (500 MG TOTAL) BY MOUTH 2 (TWO) TIMES DAILY WITH A MEAL.    Dispense:  180 tablet    Refill:  1   Patient Instructions  Cut out sugar containing beverages/sodas. Decrease fast food.  Blood sugar too high, so will increase metformin to 850mg  twice per day, but diet changes will also be needed.  If not controlled in 3 months, will need to add another diabetes medicine.  Keep follow up with cardiology.  Decrease salt in diet (fast food is  likely a big culprit) to see if this helps with swelling. continue lasix at same dose for now.  Return to the clinic or go to the nearest emergency room if any of your symptoms worsen or new symptoms occur. You should receive  a call or letter about your lab results within the next week to 10 days.    Low-Sodium Eating Plan Sodium raises blood pressure and causes water to be held in the body. Getting less sodium from food will help lower your blood pressure, reduce any swelling, and protect your heart, liver, and kidneys. We get sodium by adding salt (sodium chloride) to food. Most of our sodium comes from canned, boxed, and frozen foods. Restaurant foods, fast foods, and pizza are also very high in sodium. Even if you take medicine to lower your blood pressure or to reduce fluid in your body, getting less sodium from your food is important. WHAT IS MY PLAN? Most people should limit their sodium intake to 2,300 mg a day. Your health care provider recommends that you limit your sodium intake to __________ a day.  WHAT DO I NEED TO KNOW ABOUT THIS EATING PLAN? For the low-sodium eating plan, you will follow these general guidelines:  Choose foods with a % Daily Value for sodium of less than 5% (as listed on the food label).   Use salt-free seasonings or herbs instead of table salt or sea salt.   Check with your health care provider or pharmacist before using salt substitutes.   Eat fresh foods.  Eat more vegetables and fruits.  Limit canned vegetables. If you do use them, rinse them well to decrease the sodium.   Limit cheese to 1 oz (28 g) per day.   Eat lower-sodium products, often labeled as "lower sodium" or "no salt added."  Avoid foods that contain monosodium glutamate (MSG). MSG is sometimes added to [place] food and some canned foods.  Check food labels (Nutrition Facts labels) on foods to learn how much sodium is in one serving.  Eat more home-cooked food and less restaurant, buffet, and fast food.  When eating at a restaurant, ask that your food be prepared with less salt, or no salt if possible.  HOW DO I READ FOOD LABELS FOR SODIUM INFORMATION? The Nutrition Facts label lists the  amount of sodium in one serving of the food. If you eat more than one serving, you must multiply the listed amount of sodium by the number of servings. Food labels may also identify foods as:  Sodium free--Less than 5 mg in a serving.  Very low sodium--35 mg or less in a serving.  Low sodium--140 mg or less in a serving.  Light in sodium--50% less sodium in a serving. For example, if a food that usually has 300 mg of sodium is changed to become light in sodium, it will have 150 mg of sodium.  Reduced sodium--25% less sodium in a serving. For example, if a food that usually has 400 mg of sodium is changed to reduced sodium, it will have 300 mg of sodium. WHAT FOODS CAN I EAT? Grains Low-sodium cereals, including oats, puffed wheat and rice, and shredded wheat cereals. Low-sodium crackers. Unsalted rice and pasta. Lower-sodium bread.  Vegetables Frozen or fresh vegetables. Low-sodium or reduced-sodium canned vegetables. Low-sodium or reduced-sodium tomato sauce and paste. Low-sodium or reduced-sodium tomato and vegetable juices.  Fruits Fresh, frozen, and canned fruit. Fruit juice.  Meat and Other Protein Products Low-sodium canned tuna and salmon. Fresh or  frozen meat, poultry, seafood, and fish. Lamb. Unsalted nuts. Dried beans, peas, and lentils without added salt. Unsalted canned beans. Homemade soups without salt. Eggs.  Dairy Milk. Soy milk. Ricotta cheese. Low-sodium or reduced-sodium cheeses. Yogurt.  Condiments Fresh and dried herbs and spices. Salt-free seasonings. Onion and garlic powders. Low-sodium varieties of mustard and ketchup. Fresh or refrigerated horseradish. Lemon juice.  Fats and Oils Reduced-sodium salad dressings. Unsalted butter.  Other Unsalted popcorn and pretzels.  The items listed above may not be a complete list of recommended foods or beverages. Contact your dietitian for more options.  WHAT FOODS ARE NOT RECOMMENDED? Grains Instant hot  cereals. Bread stuffing, pancake, and biscuit mixes. Croutons. Seasoned rice or pasta mixes. Noodle soup cups. Boxed or frozen macaroni and cheese. Self-rising flour. Regular salted crackers. Vegetables Regular canned vegetables. Regular canned tomato sauce and paste. Regular tomato and vegetable juices. Frozen vegetables in sauces. Salted [place] fries. Olives. Angie Fava. Relishes. Sauerkraut. Salsa. Meat and Other Protein Products Salted, canned, smoked, spiced, or pickled meats, seafood, or fish. Bacon, ham, sausage, hot dogs, corned beef, chipped beef, and packaged luncheon meats. Salt pork. Jerky. Pickled herring. Anchovies, regular canned tuna, and sardines. Salted nuts. Dairy Processed cheese and cheese spreads. Cheese curds. Blue cheese and cottage cheese. Buttermilk.  Condiments Onion and garlic salt, seasoned salt, table salt, and sea salt. Canned and packaged gravies. Worcestershire sauce. Tartar sauce. Barbecue sauce. Teriyaki sauce. Soy sauce, including reduced sodium. Steak sauce. Fish sauce. Oyster sauce. Cocktail sauce. Horseradish that you find on the shelf. Regular ketchup and mustard. Meat flavorings and tenderizers. Bouillon cubes. Hot sauce. Tabasco sauce. Marinades. Taco seasonings. Relishes. Fats and Oils Regular salad dressings. Salted butter. Margarine. Ghee. Bacon fat.  Other Potato and tortilla chips. Corn chips and puffs. Salted popcorn and pretzels. Canned or dried soups. Pizza. Frozen entrees and pot pies.  The items listed above may not be a complete list of foods and beverages to avoid. Contact your dietitian for more information.   This information is not intended to replace advice given to you by your health care provider. Make sure you discuss any questions you have with your health care provider.   Document Released: 01/01/2002 Document Revised: 08/02/2014 Document Reviewed: 05/16/2013 Elsevier Interactive Patient Education [employer].     I  personally performed the services described in this documentation, which was scribed in my presence. The recorded information has been reviewed and considered, and addended by me as needed.    By signing my name below, I, Zola Button, attest that this documentation has been prepared under the direction and in the presence of [name], MD.  Electronically Signed: Zola Button, Medical Scribe. 05/26/2015. 3:37 PM.

## 2015-06-01 NOTE — Addendum Note (Signed)
Addended by: Delman Cheadle on: 06/01/2015 08:37 AM   Modules accepted: Orders

## 2015-06-03 ENCOUNTER — Other Ambulatory Visit (INDEPENDENT_AMBULATORY_CARE_PROVIDER_SITE_OTHER): Payer: Medicare Other

## 2015-06-03 DIAGNOSIS — R972 Elevated prostate specific antigen [PSA]: Secondary | ICD-10-CM | POA: Diagnosis not present

## 2015-06-03 DIAGNOSIS — Z125 Encounter for screening for malignant neoplasm of prostate: Secondary | ICD-10-CM

## 2015-06-03 NOTE — Addendum Note (Signed)
Addended by: Tommas Olp B on: 06/03/2015 11:21 AM   Modules accepted: Orders

## 2015-06-03 NOTE — Progress Notes (Signed)
Pt is here for lab work only. 

## 2015-06-04 LAB — PSA: PSA: 0.88 ng/mL (ref <=4.00)

## 2015-06-13 ENCOUNTER — Other Ambulatory Visit: Payer: Self-pay | Admitting: Family Medicine

## 2015-06-15 ENCOUNTER — Other Ambulatory Visit: Payer: Self-pay | Admitting: Physician Assistant

## 2015-06-29 ENCOUNTER — Other Ambulatory Visit: Payer: Self-pay | Admitting: Physician Assistant

## 2015-07-09 ENCOUNTER — Ambulatory Visit: Payer: Medicare Other | Admitting: Cardiology

## 2015-07-09 ENCOUNTER — Other Ambulatory Visit (HOSPITAL_COMMUNITY): Payer: Medicare Other

## 2015-07-14 ENCOUNTER — Encounter: Payer: Self-pay | Admitting: Cardiology

## 2015-07-14 ENCOUNTER — Ambulatory Visit (HOSPITAL_COMMUNITY): Payer: Medicare Other | Attending: Cardiovascular Disease

## 2015-07-14 ENCOUNTER — Ambulatory Visit (INDEPENDENT_AMBULATORY_CARE_PROVIDER_SITE_OTHER): Payer: Medicare Other | Admitting: Cardiology

## 2015-07-14 ENCOUNTER — Other Ambulatory Visit: Payer: Self-pay

## 2015-07-14 VITALS — BP 142/72 | HR 72 | Ht 63.0 in | Wt 208.0 lb

## 2015-07-14 DIAGNOSIS — I080 Rheumatic disorders of both mitral and aortic valves: Secondary | ICD-10-CM | POA: Diagnosis not present

## 2015-07-14 DIAGNOSIS — G4733 Obstructive sleep apnea (adult) (pediatric): Secondary | ICD-10-CM | POA: Insufficient documentation

## 2015-07-14 DIAGNOSIS — Z87891 Personal history of nicotine dependence: Secondary | ICD-10-CM | POA: Insufficient documentation

## 2015-07-14 DIAGNOSIS — I371 Nonrheumatic pulmonary valve insufficiency: Secondary | ICD-10-CM | POA: Diagnosis not present

## 2015-07-14 DIAGNOSIS — I272 Other secondary pulmonary hypertension: Secondary | ICD-10-CM | POA: Diagnosis not present

## 2015-07-14 DIAGNOSIS — M109 Gout, unspecified: Secondary | ICD-10-CM | POA: Diagnosis not present

## 2015-07-14 DIAGNOSIS — I10 Essential (primary) hypertension: Secondary | ICD-10-CM | POA: Insufficient documentation

## 2015-07-14 DIAGNOSIS — I119 Hypertensive heart disease without heart failure: Secondary | ICD-10-CM | POA: Diagnosis not present

## 2015-07-14 DIAGNOSIS — I350 Nonrheumatic aortic (valve) stenosis: Secondary | ICD-10-CM | POA: Diagnosis not present

## 2015-07-14 DIAGNOSIS — R600 Localized edema: Secondary | ICD-10-CM | POA: Diagnosis not present

## 2015-07-14 DIAGNOSIS — E785 Hyperlipidemia, unspecified: Secondary | ICD-10-CM

## 2015-07-14 DIAGNOSIS — G4710 Hypersomnia, unspecified: Secondary | ICD-10-CM | POA: Insufficient documentation

## 2015-07-14 DIAGNOSIS — E119 Type 2 diabetes mellitus without complications: Secondary | ICD-10-CM | POA: Insufficient documentation

## 2015-07-14 DIAGNOSIS — E669 Obesity, unspecified: Secondary | ICD-10-CM | POA: Insufficient documentation

## 2015-07-14 DIAGNOSIS — R0609 Other forms of dyspnea: Secondary | ICD-10-CM

## 2015-07-14 DIAGNOSIS — I340 Nonrheumatic mitral (valve) insufficiency: Secondary | ICD-10-CM | POA: Insufficient documentation

## 2015-07-14 DIAGNOSIS — I517 Cardiomegaly: Secondary | ICD-10-CM | POA: Insufficient documentation

## 2015-07-14 DIAGNOSIS — Z6837 Body mass index (BMI) 37.0-37.9, adult: Secondary | ICD-10-CM | POA: Diagnosis not present

## 2015-07-14 NOTE — Patient Instructions (Signed)
Medication Instructions:   Your physician recommends that you continue on your current medications as directed. Please refer to the Current Medication list given to you today.   Testing/Procedures:  Your physician has requested that you have a lower extremity venous duplex. This test is an ultrasound of the veins in the legs or arms. It looks at venous blood flow that carries blood from the heart to the legs or arms. Allow one hour for a Lower Venous exam. Allow thirty minutes for an Upper Venous exam. There are no restrictions or special instructions.   Your physician has requested that you have en exercise stress myoview. For further information please visit [email or web address]. Please follow instruction sheet, as given.    Follow-Up:  2 MONTHS WITH DR Meda Coffee   If you need a refill on your cardiac medications before your next appointment, please call your pharmacy.

## 2015-07-14 NOTE — Progress Notes (Signed)
Patient ID: Antonio Wells, male   DOB: 1946/07/02, 69 y.o.   MRN: [id number]    Patient Name: Antonio Wells Date of Encounter: 07/14/2015  Primary Care Provider:  [name], MD Primary Cardiologist:  Antonio Wells  Problem List   Past Medical History  Diagnosis Date  . Diabetes mellitus without complication ([place])   . Hypertension   . High cholesterol   . Gout   . Heart murmur   . Sleep apnea    Past Surgical History  Procedure Laterality Date  . Colonoscopy  2008   Allergies  Allergies  Allergen Reactions  . Penicillins Other (See Comments)    Passed out  . Tramadol Other (See Comments)    Hiccups and vomiting  . Cortisone     Hiccups and vomiting  . Lisinopril Cough   Chief complain: DOE  HPI  A pleasant 68 year old male with prior medical history of insulin-dependent diabetes mellitus known for the last 5 years, hypertension diagnosed 15 years ago, hyperlipidemia, obstructive sleep apnea, and prior history of smoking, quit 15 years ago who is coming with concern of dyspnea on exertion.the patient is currently retired and states that he used to exercise on a regular basis. Specifically he was use a stationary bike and exercise for up to an hour 3 times a week. However shows he also states that lately he hasn't been that active and noticed that he gets short of breath easily. This is a episodic sometimes he can exercise no problem and then other times he gets short of breath there quickly for example after walking one or 2 flight of stairs. He is very compliant with his medications. He quit smoking 15 years ago. He was prescribed a CPAP machine for obstructive sleep apnea but is not using it. He denies any chest pain left arm pain jaw pain or back pain, no palpitations or syncope no orthopnea or lower extremity edema.  The patient also states that his blood pressure has been uncontrolled lately.  Since the last visit patient blood pressure has been under  much better control. He states that his shortness of breath has significantly improved since the blood pressure has been better controlled. He denies any chest pain palpitation or syncope.he denies any side effect Bystolic.  0000000  -  6 month follow up, he is trying to attend [employer] program 2x/week, unable to walk a lot sec to back and knee pains. Denies CP, however has worsening exertional dyspnea. On and off LLE edema. No palpitations, syncope, no orthopnea, PND. He is a Therapist, nutritional, plays saxofone.  Home Medications  Prior to Admission medications   Medication Sig Start Date End Date Taking? Authorizing Provider  aspirin EC 81 MG tablet Take 1 tablet (81 mg total) by mouth daily. 05/31/13  Yes Antonio Burnetta Sabin, PA-C  colchicine 0.6 MG tablet Take 1-2 tablets (0.6-1.2 mg total) by mouth daily as needed (for gout flare). 2 po at onset of flair and then repeat in 1 h for acute gout attack then 1 po qd until pain free 05/31/13  Yes Antonio Burnetta Sabin, PA-C  felodipine (PLENDIL) 10 MG 24 hr tablet Take 1 tablet (10 mg total) by mouth daily. 07/27/13  Yes Antonio Bale, PA-C  glipiZIDE (GLUCOTROL) 10 MG tablet Take 1 tablet (10 mg total) by mouth 2 (two) times daily before a meal. 07/27/13  Yes Antonio Bale, PA-C  glucose blood test strip Use as directed 05/31/13  Yes Antonio Burnetta Sabin,  PA-C  losartan-hydrochlorothiazide (HYZAAR) 100-12.5 MG per tablet Take 1 tablet by mouth daily. 10/24/13  Yes Antonio Agreste, MD  lovastatin (MEVACOR) 20 MG tablet Take 1 tablet (20 mg total) by mouth at bedtime. 07/27/13  Yes Antonio Bale, PA-C  metFORMIN (GLUCOPHAGE) 500 MG tablet Take 1 tablet (500 mg total) by mouth 2 (two) times daily with a meal. 07/27/13  Yes Antonio Bale, PA-C  metoprolol succinate (TOPROL-XL) 100 MG 24 hr tablet Take 1 tablet (100 mg total) by mouth daily. 07/27/13  Yes Antonio Bale, PA-C    Family History  Family History  Problem Relation Age of Onset  . Hypertension Mother   .  Hypertension Father   . Cancer Brother     lung  . Stroke Brother   . Colon cancer Neg Hx     Social History  Social History   Social History  . Marital Status: Married    Spouse Name: [name]  . Number of Children: 3  . Years of Education: 16   Occupational History  . Musician    Social History Main Topics  . Smoking status: Former Smoker -- 2.00 packs/day for 15 years    Quit date: 07/26/1998  . Smokeless tobacco: Never Used  . Alcohol Use: Yes     Comment: occasionally  . Drug Use: No  . Sexual Activity: Yes   Other Topics Concern  . Not on file   Social History Narrative   Patient is married [name]) and lives at home with his wife.   Patient has three adult children.   Patient is retired.   Patient is a Research officer, political party.   Patient is right-handed.   Patient drinks one cup of coffee daily and 2-3 sodas per day.           Review of Systems, as per HPI, otherwise negative General:  No chills, fever, night sweats or weight changes.  Cardiovascular:  No chest pain, dyspnea on exertion, edema, orthopnea, palpitations, paroxysmal nocturnal dyspnea. Dermatological: No rash, lesions/masses Respiratory: No cough, dyspnea Urologic: No hematuria, dysuria Abdominal:   No nausea, vomiting, diarrhea, bright red blood per rectum, melena, or hematemesis Neurologic:  No visual changes, wkns, changes in mental status. All other systems reviewed and are otherwise negative except as noted above.  Physical Exam  There were no vitals taken for this visit.  General: Pleasant, NAD Psych: Normal affect. Neuro: Alert and oriented X 3. Moves all extremities spontaneously. HEENT: Normal  Neck: Supple without bruits or JVD. Lungs:  Resp regular and unlabored, CTA. Heart: RRR no s3, s4, or 3/6 systolic murmur. Abdomen: Soft, non-tender, non-distended, BS + x 4.  Extremities: No clubbing, cyanosis or edema. DP/PT/Radials 2+ and equal bilaterally.  Labs:  No results for input(s):  CKTOTAL, CKMB, TROPONINI in the last 72 hours. Lab Results  Component Value Date   WBC 4.1 11/22/2014   HGB 13.3 11/22/2014   HCT 39.9 11/22/2014   MCV 92.8 11/22/2014   PLT 223 11/22/2014    No results found for: DDIMER Invalid input(s): POCBNP    Component Value Date/Time   NA 140 05/26/2015 1511   K 4.3 05/26/2015 1511   CL 103 05/26/2015 1511   CO2 26 05/26/2015 1511   GLUCOSE 219* 05/26/2015 1511   BUN 16 05/26/2015 1511   CREATININE 1.40* 05/26/2015 1511   CREATININE 1.44 12/30/2014 1214   CALCIUM 8.9 05/26/2015 1511   PROT 7.7 12/30/2014 1214   ALBUMIN 4.9 12/30/2014 1214  AST 21 12/30/2014 1214   ALT 32 12/30/2014 1214   ALKPHOS 66 12/30/2014 1214   BILITOT 0.5 12/30/2014 1214   GFRNONAA 56* 05/24/2014 0950   GFRNONAA 52* 02/20/2014 1041   GFRAA 65 05/24/2014 0950   GFRAA 61* 02/20/2014 1041   Lab Results  Component Value Date   CHOL 149 11/22/2014   HDL 35* 11/22/2014   LDLCALC 57 11/22/2014   TRIG 286* 11/22/2014    Accessory Clinical Findings  Echocardiogram -12/20/2013   - Left ventricle: The cavity size was normal. There was mild concentric hypertrophy. Systolic function was normal. The estimated ejection fraction was in the range of 60% to 65%. Wall motion was normal; there were no regional wall motion abnormalities. Doppler parameters are consistent with abnormal left ventricular relaxation (grade 1 diastolic dysfunction). The E/e&' ratio is >15, suggesting elevated LV filling pressure. - Aortic valve: Trileaflet; mildly calcified leaflets. There is mild aortic stenosis. Peak and mean gradients of 18 and 13 mmHg, respectively. Based on an LVOT diameter of 2.2, the calculated AVA is 1.5-1.6 cm2. - Mitral valve: There was mild regurgitation. - Atrial septum: No defect or patent foramen ovale was identified. - Tricuspid valve: There was mild regurgitation. - Pulmonary arteries: PA peak pressure: 37 mm Hg (S). Mild pulmonary  hypertension.  Impressions:  - LVEF of 60-65%, mild concentric LVH. Diastolic dysfunction with elevated LV filling pressure. Mild aortic stenosis - AVA around 1.6 cm2. Mild pulmonary hypertension - RVSP 37 mmHg.  TTE: 12/2014 - Left ventricle: The cavity size was normal. Systolic function was normal. The estimated ejection fraction was in the range of 60% to 65%. Wall motion was normal; there were no regional wall motion abnormalities. Indeterminate left ventricular filling pressure by Doppler parameters. - Aortic valve: There was very mild stenosis. Valve area (VTI): 1.67 cm^2. Valve area (Vmax): 1.49 cm^2. Valve area (Vmean): 1.29 cm^2. - Pulmonary arteries: PA peak pressure: 32 mm Hg (S).  ECG - Sinus rhythm, nonspecific ST-T wave several days.  Lexiscan nuclear stress test; 12/21/13 Quantitative Gated Spect Images  QGS EDV: 106 ml  QGS ESV: 50 ml  Impression  Exercise Capacity: Lexiscan with low level exercise.  BP Response: Normal blood pressure response.  Clinical Symptoms: There is dyspnea.  ECG Impression: Nonspecific T wave abnormality during exercise and Lexiscan infusion and in recovery  Comparison with Prior Nuclear Study: No images to compare  Overall Impression: Normal stress nuclear study.  LV Ejection Fraction: 53%. LV Wall Motion: NL LV Function; NL Wall Motion   Assessment & Plan  69 year old male with multiple risk factors for coronary artery disease including prior smoking, obesity, insulin-dependent diabetes mellitus, hyperlipidemia, uncontrolled hypertension and untreated obstructive sleep apnea who is coming with concerns of   1. Dyspnea on exertion. Worsening, we will order en exercise nuclears tress test, if normal consider coronary CT as normal in the past. EKG shows nonspecific ST-T wave abnormalities.    2. Hypertension -  Borderline, evaluate during rest and on exertion during the stress test. Echocardiogram showed mild LVH impaired for  observation and mild pulmonary hypertension.   3. Lower extremity edema -  Episodic, agree with lasix 20 mg po BID, L>R, we will order B/L venous Duplex.  4. Murmur - mild mitral regurgitation and mild aortic stenosis, today  5. Hyperlipidemia - TAG 266, add Lovaza (fish oil) 4 g daily.  Followup in 2 months.   Antonio Spark, MD, [facility] 07/14/2015, 10:55 AM

## 2015-08-03 ENCOUNTER — Other Ambulatory Visit: Payer: Self-pay | Admitting: Cardiology

## 2015-08-06 ENCOUNTER — Encounter (HOSPITAL_COMMUNITY): Payer: Medicare Other

## 2015-08-07 ENCOUNTER — Encounter (HOSPITAL_COMMUNITY): Payer: Medicare Other

## 2015-08-14 ENCOUNTER — Telehealth (HOSPITAL_COMMUNITY): Payer: Self-pay

## 2015-08-14 NOTE — Telephone Encounter (Signed)
Encounter complete. 

## 2015-08-19 ENCOUNTER — Ambulatory Visit (HOSPITAL_COMMUNITY)
Admission: RE | Admit: 2015-08-19 | Discharge: 2015-08-19 | Disposition: A | Discharge status: Home / Self Care | Payer: Medicare Other | Source: Ambulatory Visit | Attending: Cardiology | Admitting: Cardiology

## 2015-08-19 ENCOUNTER — Ambulatory Visit (HOSPITAL_BASED_OUTPATIENT_CLINIC_OR_DEPARTMENT_OTHER)
Admission: RE | Admit: 2015-08-19 | Discharge: 2015-08-19 | Disposition: A | Discharge status: Home / Self Care | Payer: Medicare Other | Source: Ambulatory Visit | Attending: Cardiology | Admitting: Cardiology

## 2015-08-19 ENCOUNTER — Telehealth: Payer: Self-pay | Admitting: *Deleted

## 2015-08-19 DIAGNOSIS — Z8249 Family history of ischemic heart disease and other diseases of the circulatory system: Secondary | ICD-10-CM | POA: Insufficient documentation

## 2015-08-19 DIAGNOSIS — Z87891 Personal history of nicotine dependence: Secondary | ICD-10-CM | POA: Insufficient documentation

## 2015-08-19 DIAGNOSIS — R0609 Other forms of dyspnea: Secondary | ICD-10-CM

## 2015-08-19 DIAGNOSIS — M7989 Other specified soft tissue disorders: Secondary | ICD-10-CM | POA: Diagnosis not present

## 2015-08-19 DIAGNOSIS — Z6836 Body mass index (BMI) 36.0-36.9, adult: Secondary | ICD-10-CM | POA: Insufficient documentation

## 2015-08-19 DIAGNOSIS — I119 Hypertensive heart disease without heart failure: Secondary | ICD-10-CM | POA: Diagnosis not present

## 2015-08-19 DIAGNOSIS — R600 Localized edema: Secondary | ICD-10-CM | POA: Diagnosis not present

## 2015-08-19 DIAGNOSIS — G4733 Obstructive sleep apnea (adult) (pediatric): Secondary | ICD-10-CM | POA: Insufficient documentation

## 2015-08-19 DIAGNOSIS — E669 Obesity, unspecified: Secondary | ICD-10-CM | POA: Insufficient documentation

## 2015-08-19 DIAGNOSIS — E119 Type 2 diabetes mellitus without complications: Secondary | ICD-10-CM | POA: Insufficient documentation

## 2015-08-19 DIAGNOSIS — I10 Essential (primary) hypertension: Secondary | ICD-10-CM | POA: Diagnosis not present

## 2015-08-19 DIAGNOSIS — R609 Edema, unspecified: Secondary | ICD-10-CM | POA: Diagnosis not present

## 2015-08-19 LAB — MYOCARDIAL PERFUSION IMAGING
Estimated workload: 7 METS
Exercise duration (min): 5 min
LV dias vol: 83 mL
LV sys vol: 39 mL
MPHR: 152 {beats}/min
Peak HR: 141 {beats}/min
Percent HR: 92 %
RPE: 15
Rest HR: 89 {beats}/min
SDS: 1
SRS: 0
SSS: 1
TID: 1.11

## 2015-08-19 MED ORDER — ISOSORBIDE MONONITRATE ER 30 MG PO TB24: 30.0000 mg | ORAL_TABLET | Freq: Every day | ORAL | Status: DC

## 2015-08-19 MED ORDER — TECHNETIUM TC 99M SESTAMIBI GENERIC - CARDIOLITE
10.5000 | Freq: Once | INTRAVENOUS | Status: AC | PRN
  Administered 2015-08-19: 11 via INTRAVENOUS

## 2015-08-19 MED ORDER — TECHNETIUM TC 99M SESTAMIBI GENERIC - CARDIOLITE
30.7000 | Freq: Once | INTRAVENOUS | Status: AC | PRN
  Administered 2015-08-19: 30.7 via INTRAVENOUS

## 2015-08-19 NOTE — Telephone Encounter (Signed)
Notified the pt that per Dr Meda Coffee, his stress test was normal, but hypertensive response was noted.  Informed the pt that per Dr Meda Coffee, she recommends that we add imdur 30 mg po daily to his regimen.  Confirmed the pharmacy of choice with the pt.  Pt verbalized understanding and agrees with this plan.

## 2015-08-19 NOTE — Telephone Encounter (Signed)
-----   Message from [name], MD sent at 08/19/2015  2:35 PM EST ----- Normal stress test , hypertensive response, please add imdur 30 mg po to his regimen

## 2015-08-20 ENCOUNTER — Telehealth: Payer: Self-pay | Admitting: Family Medicine

## 2015-08-20 NOTE — Telephone Encounter (Signed)
LEFT A MESSAGE FOR PATIENT TO RETURN CALL TO SEE IF HE HAS HAD A RECENT EYE EXAM WITHIN THE LAST YEAR.  IF SO WHERE AND WHEN?  IF HE HAS NOT, MAY WE HELP HIM GET ONE SCHEDULED.

## 2015-08-25 ENCOUNTER — Ambulatory Visit (INDEPENDENT_AMBULATORY_CARE_PROVIDER_SITE_OTHER): Payer: Medicare Other | Admitting: Family Medicine

## 2015-08-25 ENCOUNTER — Encounter: Payer: Self-pay | Admitting: Family Medicine

## 2015-08-25 VITALS — BP 165/82 | HR 70 | Temp 98.6°F | Resp 16 | Ht 63.0 in | Wt 203.0 lb

## 2015-08-25 DIAGNOSIS — IMO0001 Reserved for inherently not codable concepts without codable children: Secondary | ICD-10-CM

## 2015-08-25 DIAGNOSIS — E1165 Type 2 diabetes mellitus with hyperglycemia: Secondary | ICD-10-CM

## 2015-08-25 DIAGNOSIS — Z23 Encounter for immunization: Secondary | ICD-10-CM | POA: Diagnosis not present

## 2015-08-25 DIAGNOSIS — I10 Essential (primary) hypertension: Secondary | ICD-10-CM

## 2015-08-25 DIAGNOSIS — E119 Type 2 diabetes mellitus without complications: Secondary | ICD-10-CM | POA: Diagnosis not present

## 2015-08-25 LAB — MICROALBUMIN, URINE: Microalb, Ur: 39.6 mg/dL

## 2015-08-25 LAB — GLUCOSE, POCT (MANUAL RESULT ENTRY): POC GLUCOSE: 180 mg/dL — AB (ref 70–99)

## 2015-08-25 LAB — POCT GLYCOSYLATED HEMOGLOBIN (HGB A1C): Hemoglobin A1C: 9

## 2015-08-25 MED ORDER — DAPAGLIFLOZIN PROPANEDIOL 5 MG PO TABS: 5.0000 mg | ORAL_TABLET | Freq: Every day | ORAL | Status: DC

## 2015-08-25 MED ORDER — METFORMIN HCL 500 MG PO TABS: ORAL_TABLET | ORAL | Status: DC

## 2015-08-25 NOTE — Progress Notes (Signed)
Subjective:  By signing my name below, I, [name], attest that this documentation has been prepared under the direction and in the presence of [name], MD. Electronically Signed: [name], [place]. 08/25/2015 , 11:32 AM .  Patient was seen in Room 24 .   Patient ID: Antonio Wells, male    DOB: [birth year], 70 y.o.   MRN: [id number] Chief Complaint  Patient presents with  . Diabetes  . Hypertension   HPI Antonio Wells is a 70 y.o. male Pt is here for follow up.   DM Lab Results  Component Value Date   HGBA1C 9.2 05/26/2015   Lab Results  Component Value Date   MICROALBUR 7.3* 05/24/2014   Last eye exam: He's going to see them in next couple of months. He's waiting on insurance to cover him.  Uncontrolled last visit in October. Diet changes were discussed along with increasing metformin to 850 mg bid. Continued glipizide 10mg  bid.   He started on [facility] program 2 weeks ago.  He takes metformin 850 mg in the morning and 500 mg at night. When he took the 850 mg bid, he had stomachache with leading into diarrhea.   Wt Readings from Last 3 Encounters:  08/25/15 203 lb (92.08 kg)  08/19/15 208 lb (94.348 kg)  07/14/15 208 lb (94.348 kg)    HTN Borderline: Seen cardiology on dec 19th 2016, Dr. Meda Coffee with [facility].  Did have some ST/T-wave on EKG, he had nuclear stress test on Jan 24th. Recommended imdur 30mg  qd, low risk. With hypertensive response EF 53%.   Lab Results  Component Value Date   CREATININE 1.40* 05/26/2015   Prior creatinine in June was 1.44 with GFR of 62.   Planned on follow up in 2 weeks with Dr. Meda Coffee.  He denies complications with the imdur. He denies chest pain, chest tightness, abd pain, blood in stool. He has some shortness of breath with exertion.   He has a BP wrist cuff at home.   Lower extremity edema Advised to decrease salt in diet last visit.  Lower extremity doppler on jan 24th, no evidence of DVT.    Immunizations He received a flu shot today.   Patient Active Problem List   Diagnosis Date Noted  . Hypertensive heart disease 07/14/2015  . Lower extremity edema 07/14/2015  . Aortic stenosis 01/09/2015  . OSA on CPAP 12/12/2014  . Hypersomnia with sleep apnea 12/12/2014  . Obstructive sleep apnea (adult) (pediatric) 02/04/2014  . 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 Obesity 02/04/2014  . Mitral regurgitation and aortic stenosis 01/02/2014  . DOE (dyspnea on exertion) 11/26/2013  . Gout 08/12/2011  . Hypertension 08/12/2011  . High cholesterol 08/12/2011  . OSA (obstructive sleep apnea) 08/12/2011  . Diabetes type 2, uncontrolled ([place]) 08/12/2011   Past Medical History  Diagnosis Date  . Diabetes mellitus without complication ([place])   . Hypertension   . High cholesterol   . Gout   . Heart murmur   . Sleep apnea    Past Surgical History  Procedure Laterality Date  . Colonoscopy  2008   Allergies  Allergen Reactions  . Penicillins Other (See Comments)    Passed out  . Tramadol Other (See Comments)    Hiccups and vomiting  . Cortisone     Hiccups and vomiting  . Lisinopril Cough   Prior to Admission medications   Medication Sig Start Date End Date Taking? Authorizing Provider  amLODipine (NORVASC) 10 MG tablet  TAKE 1 TABLET (10 MG TOTAL) BY MOUTH DAILY. 08/04/15   [name], MD  aspirin EC 81 MG tablet Take 1 tablet (81 mg total) by mouth daily. 05/31/13   Harvie Heck, PA-C  colchicine 0.6 MG tablet Take 1-2 tablets (0.6-1.2 mg total) by mouth daily as needed (for gout flare). 2 po at onset of flair and then repeat in 1 h for acute gout attack then 1 po qd until pain free 05/31/13   Harvie Heck, PA-C  furosemide (LASIX) 20 MG tablet TAKE 1 TABLET (20 MG TOTAL) BY MOUTH DAILY 06/13/15   Shawnee Knapp, MD  glipiZIDE (GLUCOTROL) 10 MG tablet TAKE 1 TABLET BY MOUTH TWICE A DAY BEFORE MEALS. 05/26/15   [name], MD  isosorbide mononitrate (IMDUR) 30 MG 24  hr tablet Take 1 tablet (30 mg total) by mouth daily. 08/19/15   [name], MD  losartan (COZAAR) 100 MG tablet TAKE 1 TABLET (100 MG TOTAL) BY MOUTH DAILY. 08/04/15   [name], MD  lovastatin (MEVACOR) 20 MG tablet TAKE 1 TABLET BY MOUTH EVERY DAY AT BEDTIME. 05/26/15   [name], MD  metFORMIN (GLUCOPHAGE) 850 MG tablet TAKE 1 TABLET (500 MG TOTAL) BY MOUTH 2 (TWO) TIMES DAILY WITH A MEAL. 05/26/15   [name], MD  metoprolol (LOPRESSOR) 50 MG tablet Take 1 tablet (50 mg total) by mouth 2 (two) times daily. 06/16/15   [name], MD   Social History   Social History  . Marital Status: Married    Spouse Name: [name]  . Number of Children: 3  . Years of Education: 16   Occupational History  . Musician    Social History Main Topics  . Smoking status: Former Smoker -- 2.00 packs/day for 15 years    Quit date: 07/26/1998  . Smokeless tobacco: Never Used  . Alcohol Use: Yes     Comment: occasionally  . Drug Use: No  . Sexual Activity: Yes   Other Topics Concern  . Not on file   Social History Narrative   Patient is married [name]) and lives at home with his wife.   Patient has three adult children.   Patient is retired.   Patient is a Research officer, political party.   Patient is right-handed.   Patient drinks one cup of coffee daily and 2-3 sodas per day.         Review of Systems  Constitutional: Negative for fatigue and unexpected weight change.  Eyes: Negative for visual disturbance.  Respiratory: Negative for cough, chest tightness and shortness of breath.   Cardiovascular: Negative for chest pain, palpitations and leg swelling.  Gastrointestinal: Negative for abdominal pain and blood in stool.  Neurological: Negative for dizziness, light-headedness and headaches.       Objective:   Physical Exam  Constitutional: He is oriented to person, place, and time. He appears well-developed and well-nourished.  HENT:  Head: Normocephalic and atraumatic.   Eyes: EOM are normal. Pupils are equal, round, and reactive to light.  Neck: No JVD present. Carotid bruit is not present.  Cardiovascular: Normal rate, regular rhythm and normal heart sounds.   No murmur heard. Pulmonary/Chest: Effort normal and breath sounds normal. He has no rales.  Musculoskeletal: He exhibits no edema.  No lower extremity edema  Neurological: He is alert and oriented to person, place, and time.  Skin: Skin is warm and dry.  Psychiatric: He has a normal mood and affect.  Vitals reviewed.  Filed Vitals:   08/25/15 1047 08/25/15 1048  BP: 156/75 165/82  Pulse: 73 70  Temp: 98.6 F (37 C)   Resp: 16   Height: 5\' 3"  (1.6 m)   Weight: 203 lb (92.08 kg)    Results for orders placed or performed in visit on 08/25/15  POCT glucose (manual entry)  Result Value Ref Range   POC Glucose 180 (A) 70 - 99 mg/dl  POCT glycosylated hemoglobin (Hb A1C)  Result Value Ref Range   Hemoglobin A1C 9.0        Assessment & Plan:   Antonio Wells is a 70 y.o. male Need for prophylactic vaccination and inoculation against influenza - Plan: Flu Vaccine QUAD 36+ mos IM  Given  Uncontrolled type 2 diabetes mellitus without complication, without long-term current use of insulin ([place]) - Plan: Microalbumin, urine, POCT glucose (manual entry), POCT glycosylated hemoglobin (Hb 123XX123), Basic metabolic panel, dapagliflozin propanediol (FARXIGA) 5 MG TABS tablet, metFORMIN (GLUCOPHAGE) 500 MG tablet  -  Uncontrolled, and did not tolerate higher dose of metformin. We'll re-start metformin at 500 mg twice a day, continue glipizide 10 mg twice a day, but add Farxiga  5 mg daily. Will check BMP, to make sure his GFR is over 60, as if less than 60, would need to pick different agent. For cost reasons did start with this medicine as he should be able to get this for free with a discount card.   Plan on repeat BMP in the next 6 weeks and assessment of home blood sugars at that time.  Essential  hypertension - Plan: Basic metabolic panel  - uncontrolled  In office  - check outside blood pressures, and if remaining over 140/90, will need to adjust medications.  - Can also be rechecked and discussed at his upcoming cardiology visit.  Meds ordered this encounter  Medications  . dapagliflozin propanediol (FARXIGA) 5 MG TABS tablet    Sig: Take 5 mg by mouth daily.    Dispense:  30 tablet    Refill:  1  . metFORMIN (GLUCOPHAGE) 500 MG tablet    Sig: TAKE 1 TABLET (500 MG TOTAL) BY MOUTH 2 (TWO) TIMES DAILY WITH A MEAL.    Dispense:  180 tablet    Refill:  1   Patient Instructions  Keep a record of your blood pressures outside of the office and if remaining over 140/90 into next week or two, may need to adjust your medicines. This can also be discussed at upcoming cardiology visit.  Start [place] for improved diabetes control.  Can continue metformin 500mg  twice per day if not tolerating 850mg  twice per day, and continue same dose of glipizide for now. I will check another kidney test, but if it is low, may need to stop farxiga and pick different medicine. Follow up in 6 weeks with home blood sugar readings. Make sure you are staying well hydrated. Continue work on diet and avoidance of sugar containing beverages.  You should receive a call or letter about your other lab results within the next week to 10 days.     I personally performed the services described in this documentation, which was scribed in my presence. The recorded information has been reviewed and considered, and addended by me as needed.

## 2015-08-25 NOTE — Patient Instructions (Addendum)
Keep a record of your blood pressures outside of the office and if remaining over 140/90 into next week or two, may need to adjust your medicines. This can also be discussed at upcoming cardiology visit.  Start [place] for improved diabetes control.  Can continue metformin 500mg  twice per day if not tolerating 850mg  twice per day, and continue same dose of glipizide for now. I will check another kidney test, but if it is low, may need to stop farxiga and pick different medicine. Follow up in 6 weeks with home blood sugar readings. Make sure you are staying well hydrated. Continue work on diet and avoidance of sugar containing beverages.  You should receive a call or letter about your other lab results within the next week to 10 days.

## 2015-08-27 ENCOUNTER — Encounter: Payer: Self-pay | Admitting: Family Medicine

## 2015-08-27 LAB — BASIC METABOLIC PANEL
BUN: 16 mg/dL (ref 7–25)
CHLORIDE: 105 mmol/L (ref 98–110)
CO2: 22 mmol/L (ref 20–31)
CREATININE: 1.27 mg/dL — AB (ref 0.70–1.25)
Calcium: 9.5 mg/dL (ref 8.6–10.3)
GLUCOSE: 144 mg/dL — AB (ref 65–99)
Potassium: 4.3 mmol/L (ref 3.5–5.3)
Sodium: 140 mmol/L (ref 135–146)

## 2015-08-30 NOTE — Telephone Encounter (Signed)
Antonio Hurry, do know of other ways to get this covered on Medicare?  Or is Invokana going to be better covered? b

## 2015-09-10 ENCOUNTER — Other Ambulatory Visit: Payer: Self-pay | Admitting: Family Medicine

## 2015-09-12 NOTE — Telephone Encounter (Signed)
I had checked with the pharmacy and manufacturer rep and [facility] [place] is covered for $45 co-pay, but savings card can not be used. The Invokana would not be any less expensive, if it is even covered because both are brand name drugs. I asked pharm to get the rx ready for pt and advised pt of all of this on MyChart, along with giving him info about [place] and [place] assistance program.

## 2015-09-22 ENCOUNTER — Encounter: Payer: Self-pay | Admitting: Cardiology

## 2015-09-22 ENCOUNTER — Ambulatory Visit (INDEPENDENT_AMBULATORY_CARE_PROVIDER_SITE_OTHER): Payer: Medicare Other | Admitting: Cardiology

## 2015-09-22 VITALS — BP 160/92 | HR 63 | Ht 63.0 in | Wt 202.0 lb

## 2015-09-22 DIAGNOSIS — E7800 Pure hypercholesterolemia, unspecified: Secondary | ICD-10-CM

## 2015-09-22 DIAGNOSIS — I10 Essential (primary) hypertension: Secondary | ICD-10-CM

## 2015-09-22 DIAGNOSIS — R0600 Dyspnea, unspecified: Secondary | ICD-10-CM

## 2015-09-22 DIAGNOSIS — R0609 Other forms of dyspnea: Secondary | ICD-10-CM

## 2015-09-22 DIAGNOSIS — I5032 Chronic diastolic (congestive) heart failure: Secondary | ICD-10-CM | POA: Diagnosis not present

## 2015-09-22 NOTE — Patient Instructions (Signed)
Medication Instructions:  Same-no changes  Labwork: In 6 months (just before next office visit). Please do not eat or drink after midnight the night before labs are drawn.  Testing/Procedures: None  Follow-Up: Your physician wants you to follow-up in: 6 month. You will receive a reminder letter in the mail two months in advance. If you don't receive a letter, please call our office to schedule the follow-up appointment.     If you need a refill on your cardiac medications before your next appointment, please call your pharmacy.

## 2015-09-22 NOTE — Progress Notes (Signed)
Patient ID: JEX ARKIN, male   DOB: 09/25/1945, 70 y.o.   MRN: [id number]    Patient Name: Antonio Wells Date of Encounter: 09/22/2015  Primary Care Provider:  [name], MD Primary Cardiologist:  [name]  Problem List   Past Medical History  Diagnosis Date  . Diabetes mellitus without complication ([place])   . Hypertension   . High cholesterol   . Gout   . Heart murmur   . Sleep apnea    Past Surgical History  Procedure Laterality Date  . Colonoscopy  2008   Allergies  Allergies  Allergen Reactions  . Penicillins Other (See Comments)    Passed out  . Tramadol Other (See Comments)    Hiccups and vomiting  . Cortisone     Hiccups and vomiting  . Lisinopril Cough   Chief complain: DOE  HPI  A pleasant 70 year old male with prior medical history of insulin-dependent diabetes mellitus known for the last 5 years, hypertension diagnosed 15 years ago, hyperlipidemia, obstructive sleep apnea, and prior history of smoking, quit 15 years ago who is coming with concern of dyspnea on exertion.the patient is currently retired and states that he used to exercise on a regular basis. Specifically he was use a stationary bike and exercise for up to an hour 3 times a week. However shows he also states that lately he hasn't been that active and noticed that he gets short of breath easily. This is a episodic sometimes he can exercise no problem and then other times he gets short of breath there quickly for example after walking one or 2 flight of stairs. He is very compliant with his medications. He quit smoking 15 years ago. He was prescribed a CPAP machine for obstructive sleep apnea but is not using it. He denies any chest pain left arm pain jaw pain or back pain, no palpitations or syncope no orthopnea or lower extremity edema.  The patient also states that his blood pressure has been uncontrolled lately.  09/22/2015 - patient is coming after 2 months, his dyspnea  on exertion improved with adding milligrams daily. He underwent an exercise stress test that showed no ischemia but hypertensive response to stress. He is trying to exercise on a stationary and has improved on Lasix.  Home Medications  Prior to Admission medications   Medication Sig Start Date End Date Taking? Authorizing Provider  aspirin EC 81 MG tablet Take 1 tablet (81 mg total) by mouth daily. 05/31/13  Yes Lauren Burnetta Sabin, PA-C  colchicine 0.6 MG tablet Take 1-2 tablets (0.6-1.2 mg total) by mouth daily as needed (for gout flare). 2 po at onset of flair and then repeat in 1 h for acute gout attack then 1 po qd until pain free 05/31/13  Yes Lauren Burnetta Sabin, PA-C  felodipine (PLENDIL) 10 MG 24 hr tablet Take 1 tablet (10 mg total) by mouth daily. 07/27/13  Yes Mancel Bale, PA-C  glipiZIDE (GLUCOTROL) 10 MG tablet Take 1 tablet (10 mg total) by mouth 2 (two) times daily before a meal. 07/27/13  Yes Mancel Bale, PA-C  glucose blood test strip Use as directed 05/31/13  Yes Lauren Burnetta Sabin, PA-C  losartan-hydrochlorothiazide (HYZAAR) 100-12.5 MG per tablet Take 1 tablet by mouth daily. 10/24/13  Yes [name], MD  lovastatin (MEVACOR) 20 MG tablet Take 1 tablet (20 mg total) by mouth at bedtime. 07/27/13  Yes Mancel Bale, PA-C  metFORMIN (GLUCOPHAGE) 500 MG tablet Take 1  tablet (500 mg total) by mouth 2 (two) times daily with a meal. 07/27/13  Yes Mancel Bale, PA-C  metoprolol succinate (TOPROL-XL) 100 MG 24 hr tablet Take 1 tablet (100 mg total) by mouth daily. 07/27/13  Yes Mancel Bale, PA-C    Family History  Family History  Problem Relation Age of Onset  . Hypertension Mother   . Hypertension Father   . Cancer Brother     lung  . Stroke Brother   . Colon cancer Neg Hx     Social History  Social History   Social History  . Marital Status: Married    Spouse Name: [name]  . Number of Children: 3  . Years of Education: 16   Occupational History  . Musician    Social History  Main Topics  . Smoking status: Former Smoker -- 2.00 packs/day for 15 years    Quit date: 07/26/1998  . Smokeless tobacco: Never Used  . Alcohol Use: Yes     Comment: occasionally  . Drug Use: No  . Sexual Activity: Yes   Other Topics Concern  . Not on file   Social History Narrative   Patient is married [name]) and lives at home with his wife.   Patient has three adult children.   Patient is retired.   Patient is a Research officer, political party.   Patient is right-handed.   Patient drinks one cup of coffee daily and 2-3 sodas per day.           Review of Systems, as per HPI, otherwise negative General:  No chills, fever, night sweats or weight changes.  Cardiovascular:  No chest pain, dyspnea on exertion, edema, orthopnea, palpitations, paroxysmal nocturnal dyspnea. Dermatological: No rash, lesions/masses Respiratory: No cough, dyspnea Urologic: No hematuria, dysuria Abdominal:   No nausea, vomiting, diarrhea, bright red blood per rectum, melena, or hematemesis Neurologic:  No visual changes, wkns, changes in mental status. All other systems reviewed and are otherwise negative except as noted above.  Physical Exam  Blood pressure 160/92, pulse 63, height 5\' 3"  (1.6 m), weight 202 lb (91.627 kg).  General: Pleasant, NAD Psych: Normal affect. Neuro: Alert and oriented X 3. Moves all extremities spontaneously. HEENT: Normal  Neck: Supple without bruits or JVD. Lungs:  Resp regular and unlabored, CTA. Heart: RRR no s3, s4, or 3/6 systolic murmur. Abdomen: Soft, non-tender, non-distended, BS + x 4.  Extremities: No clubbing, cyanosis or edema. DP/PT/Radials 2+ and equal bilaterally.  Labs:  No results for input(s): CKTOTAL, CKMB, TROPONINI in the last 72 hours. Lab Results  Component Value Date   WBC 4.1 11/22/2014   HGB 13.3 11/22/2014   HCT 39.9 11/22/2014   MCV 92.8 11/22/2014   PLT 223 11/22/2014    No results found for: DDIMER Invalid input(s): POCBNP    Component Value  Date/Time   NA 140 08/25/2015 1213   K 4.3 08/25/2015 1213   CL 105 08/25/2015 1213   CO2 22 08/25/2015 1213   GLUCOSE 144* 08/25/2015 1213   BUN 16 08/25/2015 1213   CREATININE 1.27* 08/25/2015 1213   CREATININE 1.44 12/30/2014 1214   CALCIUM 9.5 08/25/2015 1213   PROT 7.7 12/30/2014 1214   ALBUMIN 4.9 12/30/2014 1214   AST 21 12/30/2014 1214   ALT 32 12/30/2014 1214   ALKPHOS 66 12/30/2014 1214   BILITOT 0.5 12/30/2014 1214   GFRNONAA 56* 05/24/2014 0950   GFRNONAA 52* 02/20/2014 1041   GFRAA 65 05/24/2014 0950   GFRAA 61*  02/20/2014 1041   Lab Results  Component Value Date   CHOL 149 11/22/2014   HDL 35* 11/22/2014   LDLCALC 57 11/22/2014   TRIG 286* 11/22/2014    Accessory Clinical Findings  Echocardiogram -12/20/2013   - Left ventricle: The cavity size was normal. There was mild concentric hypertrophy. Systolic function was normal. The estimated ejection fraction was in the range of 60% to 65%. Wall motion was normal; there were no regional wall motion abnormalities. Doppler parameters are consistent with abnormal left ventricular relaxation (grade 1 diastolic dysfunction). The E/e&' ratio is >15, suggesting elevated LV filling pressure. - Aortic valve: Trileaflet; mildly calcified leaflets. There is mild aortic stenosis. Peak and mean gradients of 18 and 13 mmHg, respectively. Based on an LVOT diameter of 2.2, the calculated AVA is 1.5-1.6 cm2. - Mitral valve: There was mild regurgitation. - Atrial septum: No defect or patent foramen ovale was identified. - Tricuspid valve: There was mild regurgitation. - Pulmonary arteries: PA peak pressure: 37 mm Hg (S). Mild pulmonary hypertension.  Impressions:  - LVEF of 60-65%, mild concentric LVH. Diastolic dysfunction with elevated LV filling pressure. Mild aortic stenosis - AVA around 1.6 cm2. Mild pulmonary hypertension - RVSP 37 mmHg.  TTE: 12/2014 - Left ventricle: The cavity size was normal. Systolic function  was normal. The estimated ejection fraction was in the range of 60% to 65%. Wall motion was normal; there were no regional wall motion abnormalities. Indeterminate left ventricular filling pressure by Doppler parameters. - Aortic valve: There was very mild stenosis. Valve area (VTI): 1.67 cm^2. Valve area (Vmax): 1.49 cm^2. Valve area (Vmean): 1.29 cm^2. - Pulmonary arteries: PA peak pressure: 32 mm Hg (S).  ECG - Sinus rhythm, nonspecific ST-T wave several days.  Lexiscan nuclear stress test; 12/21/13 Quantitative Gated Spect Images  QGS EDV: 106 ml  QGS ESV: 50 ml  Impression  Exercise Capacity: Lexiscan with low level exercise.  BP Response: Normal blood pressure response.  Clinical Symptoms: There is dyspnea.  ECG Impression: Nonspecific T wave abnormality during exercise and Lexiscan infusion and in recovery  Comparison with Prior Nuclear Study: No images to compare  Overall Impression: Normal stress nuclear study.  LV Ejection Fraction: 53%. LV Wall Motion: NL LV Function; NL Wall Motion   Assessment & Plan  70 year old male with multiple risk factors for coronary artery disease including prior smoking, obesity, insulin-dependent diabetes mellitus, hyperlipidemia, uncontrolled hypertension and untreated obstructive sleep apnea who is coming with concerns of   1. Dyspnea on exertion. He underwent an exercise nuclear stress test that was negative for ischemia but showed hypertensive response to stress. We added Imdur 30 mg daily with improvement of his symptoms, he is able to exercise on a stationary bike without any symptoms. EKG today is normal.  2. Hypertension -  Blood pressure today 138/85. Improved with adding Imdur to his regimen.  3. Lower extremity edema -  Episodic, agree with lasix 20 mg po BID, L>R, B/L venous Duplex was negative for clot.  4. Murmur - mild mitral regurgitation and mild aortic stenosis, we will follow.  5. Hyperlipidemia - TAG 266,  added Lovaza (fish oil) 4 g daily. We'll check lipids prior to next visit.  Followup in 6 months.   [name], MD, [facility] 09/22/2015, 10:42 AM

## 2015-10-15 ENCOUNTER — Encounter: Payer: Self-pay | Admitting: Family Medicine

## 2015-10-15 ENCOUNTER — Ambulatory Visit (INDEPENDENT_AMBULATORY_CARE_PROVIDER_SITE_OTHER): Payer: Medicare Other | Admitting: Family Medicine

## 2015-10-15 VITALS — BP 116/70 | HR 86 | Temp 98.3°F | Resp 16 | Ht 62.75 in | Wt 198.8 lb

## 2015-10-15 DIAGNOSIS — I10 Essential (primary) hypertension: Secondary | ICD-10-CM | POA: Diagnosis not present

## 2015-10-15 DIAGNOSIS — E1165 Type 2 diabetes mellitus with hyperglycemia: Secondary | ICD-10-CM | POA: Diagnosis not present

## 2015-10-15 DIAGNOSIS — R7989 Other specified abnormal findings of blood chemistry: Secondary | ICD-10-CM

## 2015-10-15 DIAGNOSIS — E781 Pure hyperglyceridemia: Secondary | ICD-10-CM | POA: Diagnosis not present

## 2015-10-15 DIAGNOSIS — R748 Abnormal levels of other serum enzymes: Secondary | ICD-10-CM | POA: Diagnosis not present

## 2015-10-15 DIAGNOSIS — IMO0001 Reserved for inherently not codable concepts without codable children: Secondary | ICD-10-CM

## 2015-10-15 LAB — COMPLETE METABOLIC PANEL WITH GFR
ALBUMIN: 4.6 g/dL (ref 3.6–5.1)
ALK PHOS: 51 U/L (ref 40–115)
ALT: 23 U/L (ref 9–46)
AST: 19 U/L (ref 10–35)
BILIRUBIN TOTAL: 0.5 mg/dL (ref 0.2–1.2)
BUN: 17 mg/dL (ref 7–25)
CALCIUM: 9.1 mg/dL (ref 8.6–10.3)
CO2: 28 mmol/L (ref 20–31)
CREATININE: 1.36 mg/dL — AB (ref 0.70–1.25)
Chloride: 105 mmol/L (ref 98–110)
GFR, EST AFRICAN AMERICAN: 61 mL/min (ref >=60)
GFR, EST NON AFRICAN AMERICAN: 53 mL/min — AB (ref >=60)
Glucose, Bld: 145 mg/dL — ABNORMAL HIGH (ref 65–99)
Potassium: 4.2 mmol/L (ref 3.5–5.3)
Sodium: 142 mmol/L (ref 135–146)
TOTAL PROTEIN: 6.7 g/dL (ref 6.1–8.1)

## 2015-10-15 LAB — LIPID PANEL
Cholesterol: 145 mg/dL (ref 125–200)
HDL: 41 mg/dL (ref >=40)
LDL CALC: 77 mg/dL (ref <130)
TRIGLYCERIDES: 133 mg/dL (ref <150)
Total CHOL/HDL Ratio: 3.5 Ratio (ref <=5.0)
VLDL: 27 mg/dL (ref <30)

## 2015-10-15 LAB — GLUCOSE, POCT (MANUAL RESULT ENTRY): POC Glucose: 136 mg/dl — AB (ref 70–99)

## 2015-10-15 NOTE — Progress Notes (Signed)
Subjective:  By signing my name below, I, [name], attest that this documentation has been prepared under the direction and in the presence of [name], MD. Electronically Signed: [name], [place]. 10/15/2015 , 4:35 PM .  Patient was seen in Room 21 .   Patient ID: Antonio Wells, male    DOB: [birth year], 70 y.o.   MRN: [id number] Chief Complaint  Patient presents with  . Follow-up    6 mos  . Diabetes  . Hyperlipidemia  . LE edema    "better"  . Shortness of Breath    "better"  . Hypertension   HPI Antonio Wells is a 70 y.o. male Pt is here for 6 months follow up.   DM Lab Results  Component Value Date   HGBA1C 9.0 08/25/2015   Last office visit 08/25/15. Did not tolerate higher dose of metformin so restarted metformin 500mg  bid. Continued glipizide 10mg  bid, and farxiga 5mg  qd. See emails regarding farxiga. Apparently this is a $45 copay with his insurance.   He is in [facility] diabetes program.  He's been taking metformin 850mg  in the morning and 500mg  in the evening. He still has some nausea but not as bad as before. He never tried [place] due to its cost.   He forgot to bring his sugar readings today. His sugar ran about 116 this morning. He remembers his sugar ranges from 90s to up to 150s. He denies any symptomatic lows.   Lab Results  Component Value Date   MICROALBUR 39.6 08/25/2015   Vision He doesn't remember when his last vision appointment was and believes more than 1 year ago. He is planning to have an appointment with eye doctor soon.   HTN BP elevated last visit. Saw cardiology 09/22/15. Has been noting dyspnea on exertion. Under went stress test without ischemia and was improving with exercise on stationary bike and lasix.   He is doing stationary bike. He will check with cardiology in 6 months.   Wt Readings from Last 3 Encounters:  10/15/15 198 lb 12.8 oz (90.175 kg)  09/22/15 202 lb (91.627 kg)  08/25/15 203 lb (92.08 kg)    Dyspnea on exertion See information above; negative stress test without ischemia but did have hypertensive response. He's continued imdur 30mg  qd.   Lower extremity edema He's on lasix 20mg  qd. He had a negative duplex for DVT in January.    HLD Cardiologist added Lovaza for elevated triglycerides; plan on lipids prior to next visit.   Patient Active Problem List   Diagnosis Date Noted  . Hypertensive heart disease 07/14/2015  . Lower extremity edema 07/14/2015  . Aortic stenosis 01/09/2015  . OSA on CPAP 12/12/2014  . Hypersomnia with sleep apnea 12/12/2014  . Obstructive sleep apnea (adult) (pediatric) 02/04/2014  . 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 Obesity 02/04/2014  . Mitral regurgitation and aortic stenosis 01/02/2014  . DOE (dyspnea on exertion) 11/26/2013  . Gout 08/12/2011  . Hypertension 08/12/2011  . High cholesterol 08/12/2011  . OSA (obstructive sleep apnea) 08/12/2011  . Diabetes type 2, uncontrolled ([place]) 08/12/2011   Past Medical History  Diagnosis Date  . Diabetes mellitus without complication ([place])   . Hypertension   . High cholesterol   . Gout   . Heart murmur   . Sleep apnea    Past Surgical History  Procedure Laterality Date  . Colonoscopy  2008   Allergies  Allergen Reactions  . Penicillins Other (See Comments)    Passed  out  . Tramadol Other (See Comments)    Hiccups and vomiting  . Cortisone     Hiccups and vomiting  . Lisinopril Cough   Prior to Admission medications   Medication Sig Start Date End Date Taking? Authorizing Provider  amLODipine (NORVASC) 10 MG tablet TAKE 1 TABLET (10 MG TOTAL) BY MOUTH DAILY. 08/04/15   [name], MD  aspirin EC 81 MG tablet Take 1 tablet (81 mg total) by mouth daily. 05/31/13   Harvie Heck, PA-C  colchicine 0.6 MG tablet Take 1-2 tablets (0.6-1.2 mg total) by mouth daily as needed (for gout flare). 2 po at onset of flair and then repeat in 1 h for acute gout attack then 1 po qd until pain  free 05/31/13   Harvie Heck, PA-C  furosemide (LASIX) 20 MG tablet TAKE 1 TABLET (20 MG TOTAL) BY MOUTH DAILY 09/10/15   [name], MD  glipiZIDE (GLUCOTROL) 10 MG tablet TAKE 1 TABLET BY MOUTH TWICE A DAY BEFORE MEALS. 05/26/15   [name], MD  isosorbide mononitrate (IMDUR) 30 MG 24 hr tablet Take 1 tablet (30 mg total) by mouth daily. 08/19/15   [name], MD  losartan (COZAAR) 100 MG tablet TAKE 1 TABLET (100 MG TOTAL) BY MOUTH DAILY. 08/04/15   [name], MD  lovastatin (MEVACOR) 20 MG tablet TAKE 1 TABLET BY MOUTH EVERY DAY AT BEDTIME. 05/26/15   [name], MD  metFORMIN (GLUCOPHAGE) 500 MG tablet TAKE 1 TABLET (500 MG TOTAL) BY MOUTH 2 (TWO) TIMES DAILY WITH A MEAL. 08/25/15   [name], MD  metoprolol (LOPRESSOR) 50 MG tablet Take 1 tablet (50 mg total) by mouth 2 (two) times daily. 06/16/15   [name], MD   Social History   Social History  . Marital Status: Married    Spouse Name: [name]  . Number of Children: 3  . Years of Education: 16   Occupational History  . Musician    Social History Main Topics  . Smoking status: Former Smoker -- 2.00 packs/day for 15 years    Quit date: 07/26/1998  . Smokeless tobacco: Never Used  . Alcohol Use: Yes     Comment: occasionally  . Drug Use: No  . Sexual Activity: Yes   Other Topics Concern  . Not on file   Social History Narrative   Patient is married [name]) and lives at home with his wife.   Patient has three adult children.   Patient is retired.   Patient is a Research officer, political party.   Patient is right-handed.   Patient drinks one cup of coffee daily and 2-3 sodas per day.         Review of Systems  Constitutional: Negative for fatigue and unexpected weight change.  Eyes: Negative for visual disturbance.  Respiratory: Negative for cough, chest tightness and shortness of breath.   Cardiovascular: Negative for chest pain, palpitations and leg swelling.  Gastrointestinal: Negative for  abdominal pain and blood in stool.  Neurological: Negative for dizziness, light-headedness and headaches.       Objective:   Physical Exam  Constitutional: He is oriented to person, place, and time. He appears well-developed and well-nourished.  HENT:  Head: Normocephalic and atraumatic.  Eyes: EOM are normal. Pupils are equal, round, and reactive to light.  Neck: No JVD present. Carotid bruit is not present.  Cardiovascular: Normal rate, regular rhythm and normal heart sounds.   No murmur heard. Pulmonary/Chest: Effort normal  and breath sounds normal. He has no rales.  Musculoskeletal: He exhibits no edema.  Neurological: He is alert and oriented to person, place, and time.  Skin: Skin is warm and dry.  Psychiatric: He has a normal mood and affect.  Vitals reviewed.   Filed Vitals:   10/15/15 1538  BP: 116/70  Pulse: 86  Temp: 98.3 F (36.8 C)  TempSrc: Oral  Resp: 16  Height: 5' 2.75" (1.594 m)  Weight: 198 lb 12.8 oz (90.175 kg)  SpO2: 97%      Assessment & Plan:    Antonio Wells is a 70 y.o. male Uncontrolled type 2 diabetes mellitus without complication, without long-term current use of insulin ([place]) - Plan: [facility] Diabetes Foot Exam, POCT glucose (manual entry)  - Improved control based on home readings. We'll continue 850 mg metformin morning, 500 mg at night. Cost prohibitive for [place]. We'll repeat A1c in 6 weeks. If still uncontrolled, may need to look at other classes of medications as intolerant to higher doses of metformin. Continue to work on diet and exercise.  Essential hypertension - Plan: COMPLETE METABOLIC PANEL WITH GFR  -Stable. Improving dyspnea on exertion and lower extremity edema. Has Lasix if needed, but advised may not need to take this unless he is symptomatic. Continue amlodipine, metoprolol and losartan at same doses. CMP pending.  Elevated serum creatinine  -Minimally elevated. Avoid nephrotoxins/NSAIDs. CMP  pending.  Hypertriglyceridemia - Plan: COMPLETE METABOLIC PANEL WITH GFR, Lipid panel  -Likely due in part to his uncontrolled diabetes when last checked. Will repeat lipid panel and CMP. May need to start Lovaza depending on those results. Continue lovastatin 20 mg daily for now.  No orders of the defined types were placed in this encounter.   Patient Instructions       IF you received an x-ray today, you will receive an invoice from [facility]. Please contact [facility] at [phone] with questions or concerns regarding your invoice.   IF you received labwork today, you will receive an invoice from [employer]. Please contact [facility] at [phone] with questions or concerns regarding your invoice.   Our billing staff will not be able to assist you with questions regarding bills from these companies.  You will be contacted with the lab results as soon as they are available. The fastest way to get your results is to activate your My Chart account. Instructions are located on the last page of this paperwork. If you have not heard from [place] regarding the results in 2 weeks, please contact this office.    Lasix if needed for swelling. Call cardiologist to see if you need to be on Lovaza for triglycerides. Can wait to see cholesterol test form today if you would like. These should also improve with improved blood sugar control.   Keep up the good work with diet and exercise. Ok to continue metformin and glipizide for now, but recheck in 6-8 weeks for repeat A1c.   Return to the clinic or go to the nearest emergency room if any of your symptoms worsen or new symptoms occur.      I personally performed the services described in this documentation, which was scribed in my presence. The recorded information has been reviewed and considered, and addended by me as needed.

## 2015-10-15 NOTE — Patient Instructions (Addendum)
     IF you received an x-ray today, you will receive an invoice from [facility]. Please contact [facility] at [phone] with questions or concerns regarding your invoice.   IF you received labwork today, you will receive an invoice from [employer]. Please contact [facility] at [phone] with questions or concerns regarding your invoice.   Our billing staff will not be able to assist you with questions regarding bills from these companies.  You will be contacted with the lab results as soon as they are available. The fastest way to get your results is to activate your My Chart account. Instructions are located on the last page of this paperwork. If you have not heard from [place] regarding the results in 2 weeks, please contact this office.    Lasix if needed for swelling. Call cardiologist to see if you need to be on Lovaza for triglycerides. Can wait to see cholesterol test form today if you would like. These should also improve with improved blood sugar control.   Keep up the good work with diet and exercise. Ok to continue metformin and glipizide for now, but recheck in 6-8 weeks for repeat A1c.   Return to the clinic or go to the nearest emergency room if any of your symptoms worsen or new symptoms occur.

## 2015-11-19 ENCOUNTER — Ambulatory Visit: Payer: Medicare Other | Admitting: Family Medicine

## 2015-12-11 DIAGNOSIS — E119 Type 2 diabetes mellitus without complications: Secondary | ICD-10-CM | POA: Diagnosis not present

## 2015-12-11 DIAGNOSIS — L602 Onychogryphosis: Secondary | ICD-10-CM | POA: Diagnosis not present

## 2015-12-11 DIAGNOSIS — L603 Nail dystrophy: Secondary | ICD-10-CM | POA: Diagnosis not present

## 2015-12-11 DIAGNOSIS — B353 Tinea pedis: Secondary | ICD-10-CM | POA: Diagnosis not present

## 2015-12-11 DIAGNOSIS — B351 Tinea unguium: Secondary | ICD-10-CM | POA: Diagnosis not present

## 2015-12-23 ENCOUNTER — Other Ambulatory Visit: Payer: Self-pay | Admitting: Family Medicine

## 2016-01-06 ENCOUNTER — Other Ambulatory Visit: Payer: Self-pay | Admitting: Family Medicine

## 2016-01-06 DIAGNOSIS — E083291 Diabetes mellitus due to underlying condition with mild nonproliferative diabetic retinopathy without macular edema, right eye: Secondary | ICD-10-CM | POA: Diagnosis not present

## 2016-01-06 DIAGNOSIS — H2513 Age-related nuclear cataract, bilateral: Secondary | ICD-10-CM | POA: Diagnosis not present

## 2016-01-06 DIAGNOSIS — H35033 Hypertensive retinopathy, bilateral: Secondary | ICD-10-CM | POA: Diagnosis not present

## 2016-01-06 DIAGNOSIS — Z7984 Long term (current) use of oral hypoglycemic drugs: Secondary | ICD-10-CM | POA: Diagnosis not present

## 2016-01-08 ENCOUNTER — Encounter: Payer: Self-pay | Admitting: Family Medicine

## 2016-01-08 ENCOUNTER — Ambulatory Visit (INDEPENDENT_AMBULATORY_CARE_PROVIDER_SITE_OTHER): Payer: Medicare Other | Admitting: Family Medicine

## 2016-01-08 VITALS — BP 155/77 | HR 74 | Temp 98.2°F | Resp 16 | Ht 63.0 in | Wt 199.6 lb

## 2016-01-08 DIAGNOSIS — E11319 Type 2 diabetes mellitus with unspecified diabetic retinopathy without macular edema: Secondary | ICD-10-CM

## 2016-01-08 DIAGNOSIS — E1165 Type 2 diabetes mellitus with hyperglycemia: Secondary | ICD-10-CM | POA: Diagnosis not present

## 2016-01-08 DIAGNOSIS — I10 Essential (primary) hypertension: Secondary | ICD-10-CM

## 2016-01-08 DIAGNOSIS — E113299 Type 2 diabetes mellitus with mild nonproliferative diabetic retinopathy without macular edema, unspecified eye: Secondary | ICD-10-CM

## 2016-01-08 DIAGNOSIS — Z1159 Encounter for screening for other viral diseases: Secondary | ICD-10-CM | POA: Diagnosis not present

## 2016-01-08 DIAGNOSIS — E785 Hyperlipidemia, unspecified: Secondary | ICD-10-CM

## 2016-01-08 DIAGNOSIS — R21 Rash and other nonspecific skin eruption: Secondary | ICD-10-CM

## 2016-01-08 DIAGNOSIS — Z0000 Encounter for general adult medical examination without abnormal findings: Secondary | ICD-10-CM | POA: Diagnosis not present

## 2016-01-08 DIAGNOSIS — R609 Edema, unspecified: Secondary | ICD-10-CM

## 2016-01-08 LAB — GLUCOSE, POCT (MANUAL RESULT ENTRY): POC Glucose: 242 mg/dl — AB (ref 70–99)

## 2016-01-08 LAB — POCT GLYCOSYLATED HEMOGLOBIN (HGB A1C): Hemoglobin A1C: 7.8

## 2016-01-08 MED ORDER — LOVASTATIN 20 MG PO TABS: ORAL_TABLET | ORAL | Status: DC

## 2016-01-08 MED ORDER — METOPROLOL TARTRATE 50 MG PO TABS: 50.0000 mg | ORAL_TABLET | Freq: Two times a day (BID) | ORAL | Status: DC

## 2016-01-08 MED ORDER — FUROSEMIDE 20 MG PO TABS: ORAL_TABLET | ORAL | Status: DC

## 2016-01-08 NOTE — Progress Notes (Signed)
By signing my name below, I, Antonio Wells, attest that this documentation has been prepared under the direction and in the presence of Antonio Ray, MD.  Electronically Signed: Verlee Wells, Medical Scribe. 01/08/2016. 10:49 AM.  Subjective:    Patient ID: Antonio Wells, male    DOB: 1946-04-09, 70 y.o.   MRN: [id number]  HPI Chief Complaint  Patient presents with  . Annual Exam    per patient no refills at this visit    HPI Comments: Antonio Wells is a 70 y.o. male with a PMHx of DM who presents to the [facility] for an annual physical exam. Pt had a visit with his podiatrist for a rash around his ankle that radiated up his leg- symptoms have improved since then. Pt was given a antifungal cream for symptoms. Pt gets a cough while on Lisinopril.  OSA on CPAP: Still uses his machine.  HTN: Stable at last visit. Seen by Cardiology in Feb. Stable echo in Dec 2016. NL stress test overall Jan 2017- Imdur 30 mg QD added at that time, continued other medications. Episodic lower extremity edema, treated with Lasix PRN. Pt doesn't skip on his medications, but states it's been running high; ranging 140/100. Pt will start to write down his numbers to help him remember. Pt has been taking Lasix QD for a while now.  HLD: Lab Results  Component Value Date   CHOL 145 10/15/2015   HDL 41 10/15/2015   LDLCALC 77 10/15/2015   TRIG 133 10/15/2015   CHOLHDL 3.5 10/15/2015   Lab Results  Component Value Date   ALT 23 10/15/2015   AST 19 10/15/2015   ALKPHOS 51 10/15/2015   BILITOT 0.5 10/15/2015   Controled on Lovastatin. Pt denies side affects from his medication.  Gout: Takes Colchicine PRN. Pt had a couple of flares in the past year- not that often. Last flare was a while back.  Aortic Stenosis: Cardiologist is Dr. Meda Wells  CA Screening: Colon CA: Colonoscopy in 2008- due in 10 years. Diverticulosis with hyperplastic polyps. Prostate CA: Lab Results    Component Value Date   PSA 0.88 06/03/2015   PSA 4.92* 11/22/2014   PSA 0.74 07/03/2012  Thyroid CA: Pt denies having FHx of thyroid CA.  Immunizations: Immunization History  Administered Date(s) Administered  . Influenza, Seasonal, Injecte, Preservative Fre 07/03/2012  . Influenza,inj,Quad PF,36+ Mos 08/25/2015  . Pneumococcal Polysaccharide-23 07/03/2012  . Tdap 07/03/2012   He is due for Prevnar. Pt deferred vaccine due to being sick for a couple of weeks from is last PNA vaccine. He felt like it was worse than getting PNA.   Depression Screening: Depression screen [facility] 2/9 01/08/2016 10/15/2015 08/25/2015 05/26/2015 01/30/2015  Decreased Interest 0 0 0 0 0  Down, Depressed, Hopeless 0 0 0 0 0  PHQ - 2 Score 0 0 0 0 0   Fall Screening: No falls in the past year.  Functional Status Survey: Positive for pain in his joints; affecting walking, climbing stairs, and going down stairs. Pt states he can't walk that far. Difficulty is due to knee and hip pain- has been going for a while. Pt will occasionally take Tylenol when it's bad. Pt had shots in both knees and found out he was allergic to Cortisone- preformed by [facility] Dr. Berenice Wells. Pt is willing to take synthetic cartilage injections.  Vision:  Visual Acuity Screening   Right eye Left eye Both eyes  Without correction:  With correction: 20/20 20/30 20/20    Pt went to his ophthalmologist recently. Pt had a mild issue in his right eye due to his DM- leakage of his blood vessel, no swelling in eye, and above the pupil.  Dentist: Can't get appt until Sept.  Exercise: Pt exercises 3-4x a week for about 45 mins to an hour.  Advance Directives: Not sure if he has a living will. Material is provided today.  DM:  Lab Results  Component Value Date   HGBA1C 9.0 08/25/2015   Lab Results  Component Value Date   MICROALBUR 39.6 08/25/2015   Takes Glipizide 10 mg QD, and Metformin 500 mg  BID. He was in a DM program for  diet and exercise. We tried adding [place], but cost prohibited. He was able to tolerate 500 mg of Metformin in the morning, 500 mg at night. He could not tolerate anything higher than 500 mg BID Metformin due to side affects. Pt's blood sugar runs between 110 (rarely) to 150. This morning his blood sugar was 170, but he thinks it's due to eating peanutbutter crackers this morning.  HIV/Hep C Testing: Pt would like to get Hep C testing. Pt deferred HIV screening since he doesn't have any symptoms and he hasn't had it yet.  SHx: Pt is a new grandfather as of last week.  Patient Active Problem List   Diagnosis Date Noted  . Hypertensive heart disease 07/14/2015  . Lower extremity edema 07/14/2015  . Aortic stenosis 01/09/2015  . OSA on CPAP 12/12/2014  . Hypersomnia with sleep apnea 12/12/2014  . Obstructive sleep apnea (adult) (pediatric) 02/04/2014  . 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 Obesity 02/04/2014  . Mitral regurgitation and aortic stenosis 01/02/2014  . DOE (dyspnea on exertion) 11/26/2013  . Gout 08/12/2011  . Hypertension 08/12/2011  . High cholesterol 08/12/2011  . OSA (obstructive sleep apnea) 08/12/2011  . Diabetes type 2, uncontrolled ([place]) 08/12/2011   Past Medical History  Diagnosis Date  . Diabetes mellitus without complication ([place])   . Hypertension   . High cholesterol   . Gout   . Heart murmur   . Sleep apnea    Past Surgical History  Procedure Laterality Date  . Colonoscopy  2008   Allergies  Allergen Reactions  . Penicillins Other (See Comments)    Passed out  . Tramadol Other (See Comments)    Hiccups and vomiting  . Cortisone     Hiccups and vomiting  . Lisinopril Cough   Prior to Admission medications   Medication Sig Start Date End Date Taking? Authorizing Provider  amLODipine (NORVASC) 10 MG tablet TAKE 1 TABLET (10 MG TOTAL) BY MOUTH DAILY. 08/04/15  Yes Antonio Spark, MD  aspirin EC 81 MG tablet Take 1 tablet (81 mg total) by mouth  daily. 05/31/13  Yes Antonio Heck, PA-C  colchicine 0.6 MG tablet Take 1-2 tablets (0.6-1.2 mg total) by mouth daily as needed (for gout flare). 2 po at onset of flair and then repeat in 1 h for acute gout attack then 1 po qd until pain free 05/31/13  Yes Antonio Heck, PA-C  furosemide (LASIX) 20 MG tablet TAKE 1 TABLET (20 MG TOTAL) BY MOUTH DAILY 12/24/15  Yes [name], MD  glipiZIDE (GLUCOTROL) 10 MG tablet TAKE 1 TABLET BY MOUTH TWICE A DAY BEFORE MEALS. 05/26/15  Yes [name], MD  isosorbide mononitrate (IMDUR) 30 MG 24 hr tablet Take 1 tablet (30 mg total) by mouth daily. 08/19/15  Yes Antonio Spark, MD  losartan (COZAAR) 100 MG tablet TAKE 1 TABLET (100 MG TOTAL) BY MOUTH DAILY. 08/04/15  Yes Antonio Spark, MD  lovastatin (MEVACOR) 20 MG tablet TAKE 1 TABLET BY MOUTH EVERY DAY AT BEDTIME. 05/26/15  Yes [name], MD  metFORMIN (GLUCOPHAGE) 500 MG tablet TAKE 1 TABLET (500 MG TOTAL) BY MOUTH 2 (TWO) TIMES DAILY WITH A MEAL. 08/25/15  Yes [name], MD  metoprolol (LOPRESSOR) 50 MG tablet Take 1 tablet (50 mg total) by mouth 2 (two) times daily. 06/16/15  Yes [name], MD   Social History   Social History  . Marital Status: Married    Spouse Name: [name]  . Number of Children: 3  . Years of Education: 16   Occupational History  . Musician    Social History Main Topics  . Smoking status: Former Smoker -- 2.00 packs/day for 15 years    Types: Cigarettes    Quit date: 07/26/1998  . Smokeless tobacco: Never Used  . Alcohol Use: 0.0 oz/week    0 Standard drinks or equivalent per week     Comment: occasionally  . Drug Use: No  . Sexual Activity: Yes   Other Topics Concern  . Not on file   Social History Narrative   Patient is married [name]) and lives at home with his wife.   Patient has three adult children.   Patient is retired.   Patient is a Research officer, political party.   Patient is right-handed.   Patient drinks one cup of Wells daily and 2-3  sodas per day.          Review of Systems  Constitutional: Negative for unexpected weight change.  Cardiovascular: Negative for leg swelling.  Gastrointestinal: Negative for abdominal pain.  Genitourinary: Negative for dysuria and difficulty urinating.  Musculoskeletal: Positive for arthralgias. Negative for back pain.  Skin: Positive for rash.  Neurological: Negative for dizziness and light-headedness.    Objective:  BP 155/77 mmHg  Pulse 74  Temp(Src) 98.2 F (36.8 C) (Oral)  Resp 16  Ht 5\' 3"  (1.6 m)  Wt 199 lb 9.6 oz (90.538 kg)  BMI 35.37 kg/m2  Physical Exam  Constitutional: He is oriented to person, place, and time. He appears well-developed and well-nourished.  HENT:  Head: Normocephalic and atraumatic.  Right Ear: External ear normal.  Left Ear: External ear normal.  Mouth/Throat: Oropharynx is clear and moist.  Eyes: Conjunctivae and EOM are normal. Pupils are equal, round, and reactive to light.  Neck: Normal range of motion. Neck supple. No thyromegaly present.  Cardiovascular: Normal rate, regular rhythm, normal heart sounds and intact distal pulses.   Pulmonary/Chest: Effort normal and breath sounds normal. No respiratory distress. He has no wheezes.  Abdominal: Soft. He exhibits no distension. There is no tenderness.  Musculoskeletal: Normal range of motion. He exhibits no edema or tenderness.  Bilateral knees have FROM No focal bony tenderness  Lymphadenopathy:    He has no cervical adenopathy.  Neurological: He is alert and oriented to person, place, and time. He has normal reflexes.  Skin: Skin is warm and dry.  Hyperpigmented areas with dry, scaly skin on medial thigh. Patches on lower leg bilaterally. Patches on the upper arm bilaterally.  Psychiatric: He has a normal mood and affect. His behavior is normal.  Vitals reviewed.  Results for orders placed or performed in visit on 01/08/16  POCT glycosylated hemoglobin (Hb A1C)  Result Value Ref  Range  Hemoglobin A1C 7.8   POCT glucose (manual entry)  Result Value Ref Range   POC Glucose 242 (A) 70 - 99 mg/dl   Assessment & Plan:   Antonio Wells is a 70 y.o. male Medicare annual wellness visit, subsequent  --anticipatory guidance as below in AVS, screening labs above. Health maintenance items as above in HPI discussed/recommended as applicable.   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 Discussed options, including adding medication such as Trulicity. He would like to continue diet efforts and exercise, maintain same medications for now. Discussed need for tighter control, especially with recent diagnosis of retinopathy. Will recheck in 3 months, and at that time if A1c over 7, plan on new medication.  Essential hypertension - Plan: Basic metabolic panel  -Elevated in office, discussed goal of under 130/80 with his diabetes and now retinopathy. If remaining over the level of the next few weeks at home, call and I can adjust medications, or can discuss with his cardiologist. Labs pending  Hyperlipidemia  -Tolerating lovastatin. No change for now.  Rash and nonspecific skin eruption  -Possible tinea, multiple areas. Currently on treatment from podiatrist, but if rash not improving the next few weeks, return for further testing or other options.  Need for hepatitis C screening test - Plan: Hepatitis C antibody  Peripheral edema - Plan: furosemide (LASIX) 20 MG tablet, Basic metabolic panel  -Stable. Labs pending. Lasix daily when necessary. Continue routine follow-up with cardiology.  Meds  ordered this encounter  Medications  . furosemide (LASIX) 20 MG tablet    Sig: TAKE 1 TABLET (20 MG TOTAL) BY MOUTH DAILY    Dispense:  90 tablet    Refill:  1  . lovastatin (MEVACOR) 20 MG tablet    Sig: TAKE 1 TABLET BY MOUTH EVERY DAY AT BEDTIME.    Dispense:  90 tablet    Refill:  1  . metoprolol (LOPRESSOR) 50 MG tablet    Sig: Take 1 tablet (50 mg total) by mouth 2 (two) times daily.    Dispense:  180 tablet    Refill:  1   Patient Instructions       IF you received an x-Wells today, you will receive an invoice from [facility]. Please contact [facility] at [phone] with questions or concerns regarding your invoice.   IF you received labwork today, you will receive an invoice from [employer]. Please contact [facility] at [phone] with questions or concerns regarding your invoice.   Our billing staff will not be able to assist you with questions regarding bills from these companies.  You will be contacted with the lab results as soon as they are available. The fastest way to get your results is to activate your My Chart account. Instructions are located on the last page of this paperwork. If you have not heard from [place] regarding the results in 2 weeks, please contact this office.    Congrats on being a grandfather!  Tylenol as needed for knee pain, and you can try glucosamine over the counter.  If you do not notice relief within a few months, would not continue. If pain persists - can follow up with your orthopaedist  to discuss viscosupplementation injections to see if these are an option for you.   Keep a record of your blood pressures outside of the office and if persistently running over 130/80 - return here or talk to cardiology about increasing medication.   If rash on arms,legs not resolving in next 2 weeks (or worse sooner) - return for recheck.   Diabetes control is improved, but still above the recommended goal of  7.0. Especially with recent information about your eyes, I would like to see improved control. If you want to continue diet efforts, exercise, and keep a record of your blood sugars throughout the day, we can recheck in 3 months. At that time if still over 7, would recommend starting new medication. If you would like to start that medicine sooner, let me know. The class of medicines I would consider would be something like Trulicity if you want to check to see how that is covered by insurance. Let me know if you have any questions in the meantime.   Keeping you healthy  Get these tests  Blood pressure- Have your blood pressure checked once a year by your healthcare provider.  Normal blood pressure is 120/80  Weight- Have your body mass index (BMI) calculated to screen for obesity.  BMI is a measure of body fat based on height and weight. You can also calculate your own BMI at [email or web address].  Cholesterol- Have your cholesterol checked every year.  Diabetes- Have your blood sugar checked regularly if you have high blood pressure, high cholesterol, have a family history of diabetes or if you are overweight.  Screening for Colon Cancer- Colonoscopy starting at age 37.  Screening may begin sooner depending on your family history and other health conditions. Follow up colonoscopy as directed by your Gastroenterologist.  Screening for Prostate Cancer- Both blood work (PSA) and a rectal exam help screen for Prostate Cancer.  Screening begins at age 54 with African-American men and at age 2 with Caucasian men.  Screening may begin sooner depending on your family history.  Take these medicines  Aspirin- One aspirin daily can help prevent Heart disease and Stroke.  Flu shot- Every fall.  Tetanus- Every 10 years.  Zostavax- Once after the age of 37 to prevent Shingles.  Pneumonia shot- Once after the age of 38; if you are younger than 9, ask your healthcare provider if you need a  Pneumonia shot.  Take these steps  Don't smoke- If you do smoke, talk to your doctor about quitting.  For tips on how to quit, go to www.smokefree.gov or call 1-800-QUIT-NOW.  Be physically active- Exercise 5 days a week for at least 30 minutes.  If you are not already physically active start slow and gradually work up to 30 minutes of moderate physical activity.  Examples of moderate activity include walking briskly, mowing the yard, dancing, swimming, bicycling, etc.  Eat a healthy diet- Eat a variety of healthy food such as fruits, vegetables, low fat milk, low fat cheese, yogurt, lean meant, poultry, fish, beans, tofu, etc. For more information go to www.thenutritionsource.org  Drink alcohol in moderation- Limit alcohol intake to less than two drinks a day. Never drink and drive.  Dentist- Brush and floss twice daily; visit your dentist twice a year.  Depression- Your emotional health is as important as your physical health. If you're feeling down, or losing interest in things you would normally enjoy please talk to your healthcare provider.  Eye exam- Visit your eye doctor every  year.  Safe sex- If you may be exposed to a sexually transmitted infection, use a condom.  Seat belts- Seat belts can save your life; always wear one.  Smoke/Carbon Monoxide detectors- These detectors need to be installed on the appropriate level of your home.  Replace batteries at least once a year.  Skin cancer- When out in the sun, cover up and use sunscreen 15 SPF or higher.  Violence- If anyone is threatening you, please tell your healthcare provider.  Living Will/ Health care power of attorney- Speak with your healthcare provider and family.      I personally performed the services described in this documentation, which was scribed in my presence. The recorded information has been reviewed and considered, and addended by me as needed.   Signed,   Antonio Ray, MD [facility].  01/10/2016 9:50 AM

## 2016-01-08 NOTE — Patient Instructions (Addendum)
IF you received an x-ray today, you will receive an invoice from [facility]. Please contact [facility] at [phone] with questions or concerns regarding your invoice.   IF you received labwork today, you will receive an invoice from [employer]. Please contact [facility] at [phone] with questions or concerns regarding your invoice.   Our billing staff will not be able to assist you with questions regarding bills from these companies.  You will be contacted with the lab results as soon as they are available. The fastest way to get your results is to activate your My Chart account. Instructions are located on the last page of this paperwork. If you have not heard from [place] regarding the results in 2 weeks, please contact this office.    Congrats on being a grandfather!  Tylenol as needed for knee pain, and you can try glucosamine over the counter.  If you do not notice relief within a few months, would not continue. If pain persists - can follow up with your orthopaedist to discuss viscosupplementation injections to see if these are an option for you.   Keep a record of your blood pressures outside of the office and if persistently running over 130/80 - return here or talk to cardiology about increasing medication.   If rash on arms,legs not resolving in next 2 weeks (or worse sooner) - return for recheck.   Diabetes control is improved, but still above the recommended goal of 7.0. Especially with recent information about your eyes, I would like to see improved control. If you want to continue diet efforts, exercise, and keep a record of your blood sugars throughout the day, we can recheck in 3 months. At that time if still over 7, would recommend starting new medication. If you would like to start that medicine sooner, let me know. The class of medicines I would consider would be something like Trulicity if you want to check to see how that is  covered by insurance. Let me know if you have any questions in the meantime.   Keeping you healthy  Get these tests  Blood pressure- Have your blood pressure checked once a year by your healthcare provider.  Normal blood pressure is 120/80  Weight- Have your body mass index (BMI) calculated to screen for obesity.  BMI is a measure of body fat based on height and weight. You can also calculate your own BMI at [email or web address].  Cholesterol- Have your cholesterol checked every year.  Diabetes- Have your blood sugar checked regularly if you have high blood pressure, high cholesterol, have a family history of diabetes or if you are overweight.  Screening for Colon Cancer- Colonoscopy starting at age 41.  Screening may begin sooner depending on your family history and other health conditions. Follow up colonoscopy as directed by your Gastroenterologist.  Screening for Prostate Cancer- Both blood work (PSA) and a rectal exam help screen for Prostate Cancer.  Screening begins at age 2 with African-American men and at age 5 with Caucasian men.  Screening may begin sooner depending on your family history.  Take these medicines  Aspirin- One aspirin daily can help prevent Heart disease and Stroke.  Flu shot- Every fall.  Tetanus- Every 10 years.  Zostavax- Once after the age of 58 to prevent Shingles.  Pneumonia shot- Once after the age of 51; if you are younger than 65, ask your healthcare provider if you need a Pneumonia shot.  Take these steps  Don't  smoke- If you do smoke, talk to your doctor about quitting.  For tips on how to quit, go to www.smokefree.gov or call 1-800-QUIT-NOW.  Be physically active- Exercise 5 days a week for at least 30 minutes.  If you are not already physically active start slow and gradually work up to 30 minutes of moderate physical activity.  Examples of moderate activity include walking briskly, mowing the yard, dancing, swimming, bicycling,  etc.  Eat a healthy diet- Eat a variety of healthy food such as fruits, vegetables, low fat milk, low fat cheese, yogurt, lean meant, poultry, fish, beans, tofu, etc. For more information go to www.thenutritionsource.org  Drink alcohol in moderation- Limit alcohol intake to less than two drinks a day. Never drink and drive.  Dentist- Brush and floss twice daily; visit your dentist twice a year.  Depression- Your emotional health is as important as your physical health. If you're feeling down, or losing interest in things you would normally enjoy please talk to your healthcare provider.  Eye exam- Visit your eye doctor every year.  Safe sex- If you may be exposed to a sexually transmitted infection, use a condom.  Seat belts- Seat belts can save your life; always wear one.  Smoke/Carbon Monoxide detectors- These detectors need to be installed on the appropriate level of your home.  Replace batteries at least once a year.  Skin cancer- When out in the sun, cover up and use sunscreen 15 SPF or higher.  Violence- If anyone is threatening you, please tell your healthcare provider.  Living Will/ Health care power of attorney- Speak with your healthcare provider and family.

## 2016-01-09 LAB — HEPATITIS C ANTIBODY: HCV AB: NEGATIVE

## 2016-01-09 LAB — BASIC METABOLIC PANEL
BUN: 19 mg/dL (ref 7–25)
CALCIUM: 9.6 mg/dL (ref 8.6–10.3)
CO2: 23 mmol/L (ref 20–31)
CREATININE: 1.39 mg/dL — AB (ref 0.70–1.25)
Chloride: 104 mmol/L (ref 98–110)
Glucose, Bld: 231 mg/dL — ABNORMAL HIGH (ref 65–99)
Potassium: 4.3 mmol/L (ref 3.5–5.3)
SODIUM: 143 mmol/L (ref 135–146)

## 2016-01-12 ENCOUNTER — Other Ambulatory Visit: Payer: Self-pay | Admitting: Family Medicine

## 2016-01-20 DIAGNOSIS — L602 Onychogryphosis: Secondary | ICD-10-CM | POA: Diagnosis not present

## 2016-01-20 DIAGNOSIS — Z79899 Other long term (current) drug therapy: Secondary | ICD-10-CM | POA: Diagnosis not present

## 2016-01-20 DIAGNOSIS — E119 Type 2 diabetes mellitus without complications: Secondary | ICD-10-CM | POA: Diagnosis not present

## 2016-02-17 DIAGNOSIS — B351 Tinea unguium: Secondary | ICD-10-CM | POA: Diagnosis not present

## 2016-02-17 DIAGNOSIS — Z79899 Other long term (current) drug therapy: Secondary | ICD-10-CM | POA: Diagnosis not present

## 2016-02-18 DIAGNOSIS — Z01818 Encounter for other preprocedural examination: Secondary | ICD-10-CM | POA: Diagnosis not present

## 2016-02-20 ENCOUNTER — Other Ambulatory Visit: Payer: Self-pay | Admitting: Family Medicine

## 2016-02-20 DIAGNOSIS — IMO0001 Reserved for inherently not codable concepts without codable children: Secondary | ICD-10-CM

## 2016-02-20 DIAGNOSIS — E1165 Type 2 diabetes mellitus with hyperglycemia: Principal | ICD-10-CM

## 2016-02-24 ENCOUNTER — Other Ambulatory Visit: Payer: Medicare Other | Admitting: *Deleted

## 2016-02-24 DIAGNOSIS — E7800 Pure hypercholesterolemia, unspecified: Secondary | ICD-10-CM

## 2016-02-24 LAB — COMPREHENSIVE METABOLIC PANEL
ALT: 19 U/L (ref 9–46)
AST: 15 U/L (ref 10–35)
Albumin: 4.5 g/dL (ref 3.6–5.1)
Alkaline Phosphatase: 48 U/L (ref 40–115)
BUN: 14 mg/dL (ref 7–25)
CO2: 25 mmol/L (ref 20–31)
Calcium: 8.9 mg/dL (ref 8.6–10.3)
Chloride: 106 mmol/L (ref 98–110)
Creat: 1.52 mg/dL — ABNORMAL HIGH (ref 0.70–1.25)
Glucose, Bld: 162 mg/dL — ABNORMAL HIGH (ref 65–99)
Potassium: 4.2 mmol/L (ref 3.5–5.3)
Sodium: 142 mmol/L (ref 135–146)
Total Bilirubin: 0.4 mg/dL (ref 0.2–1.2)
Total Protein: 6.5 g/dL (ref 6.1–8.1)

## 2016-02-24 LAB — LIPID PANEL
Cholesterol: 145 mg/dL (ref 125–200)
HDL: 51 mg/dL (ref >=40)
LDL Cholesterol: 70 mg/dL (ref <130)
Total CHOL/HDL Ratio: 2.8 Ratio (ref <=5.0)
Triglycerides: 121 mg/dL (ref <150)
VLDL: 24 mg/dL (ref <30)

## 2016-03-01 ENCOUNTER — Telehealth: Payer: Self-pay | Admitting: Cardiology

## 2016-03-01 DIAGNOSIS — R609 Edema, unspecified: Secondary | ICD-10-CM

## 2016-03-01 MED ORDER — FUROSEMIDE 20 MG PO TABS: 20.0000 mg | ORAL_TABLET | Freq: Every day | ORAL | 3 refills | Status: DC | PRN

## 2016-03-01 NOTE — Telephone Encounter (Signed)
-----   Message from [name], MD sent at 03/01/2016 11:10 AM EDT ----- His Crea is slightly worse, please advise to take lasix just as needed. Avoid if there is no LE edema. Thank you, KN

## 2016-03-01 NOTE — Telephone Encounter (Signed)
Follow up  Pt returning phone call to [facility].

## 2016-03-01 NOTE — Telephone Encounter (Signed)
Notified the pt that per Dr Meda Coffee, his labs showed that his Crea is slightly worse, she recommends that he take lasix 20 mg po daily just as needed and avoid if there is no LE edema.  Changed this in the pts medication list.  Informed the pt if he continuously has swelling and sob, then he should notify our office back.  Pt verbalized understanding and agrees with this plan.

## 2016-03-01 NOTE — Telephone Encounter (Signed)
New Message  Pt returning [facility] phone call.  Per notes it's regarding lab results.  Please follow up with pt.

## 2016-03-26 ENCOUNTER — Other Ambulatory Visit: Payer: Self-pay | Admitting: Family Medicine

## 2016-04-15 ENCOUNTER — Encounter: Payer: Self-pay | Admitting: Cardiology

## 2016-04-22 ENCOUNTER — Ambulatory Visit (INDEPENDENT_AMBULATORY_CARE_PROVIDER_SITE_OTHER): Payer: Medicare Other | Admitting: Family Medicine

## 2016-04-22 ENCOUNTER — Encounter: Payer: Self-pay | Admitting: Family Medicine

## 2016-04-22 VITALS — BP 128/78 | HR 58 | Temp 99.0°F | Resp 16 | Ht 63.0 in | Wt 204.6 lb

## 2016-04-22 DIAGNOSIS — N189 Chronic kidney disease, unspecified: Secondary | ICD-10-CM | POA: Diagnosis not present

## 2016-04-22 DIAGNOSIS — E11319 Type 2 diabetes mellitus with unspecified diabetic retinopathy without macular edema: Secondary | ICD-10-CM

## 2016-04-22 DIAGNOSIS — I10 Essential (primary) hypertension: Secondary | ICD-10-CM | POA: Diagnosis not present

## 2016-04-22 LAB — BASIC METABOLIC PANEL
BUN: 12 mg/dL (ref 7–25)
CO2: 27 mmol/L (ref 20–31)
Calcium: 9.5 mg/dL (ref 8.6–10.3)
Chloride: 103 mmol/L (ref 98–110)
Creat: 1.34 mg/dL — ABNORMAL HIGH (ref 0.70–1.25)
Glucose, Bld: 151 mg/dL — ABNORMAL HIGH (ref 65–99)
POTASSIUM: 4.4 mmol/L (ref 3.5–5.3)
SODIUM: 142 mmol/L (ref 135–146)

## 2016-04-22 NOTE — Progress Notes (Signed)
By signing my name below I, Tereasa Coop, attest that this documentation has been prepared under the direction and in the presence of [name], MD. Electonically Signed. Tereasa Coop, Scribe 04/22/2016 at 11:31 AM  Subjective:    Patient ID: Antonio Wells, male    DOB: May 11, 1946, 70 y.o.   MRN: [id number]  Chief Complaint  Patient presents with  . Follow-up    Diabetes and Hypertension     HPI Antonio Wells is a 70 y.o. male who presents to the [facility] for follow up reevaluation of his HTN and DM.  HTN Lab Results  Component Value Date   CREATININE 1.52 (H) 02/24/2016  Phone note from cardiology to take lasix only as needed and to avoid if there is no edema. Followed by Dr Meda Coffee with cardiology. On 5m amlodipine QD, 1026mlosartan QD, and 5086metoprolol BID.  Home BP has been ranging at 123-174/73-102. Pt uses a wrist BP cuff. Note that when pt seen by Dr GreCarlota Raspberry 01/08/16 BP was 155/77. BP on 10/15/15 visit was 116/70. Pt's BP with Dr NelMeda Coffees 160/92, pt started on 13m89mdur QD and pt's BP was 135/85 upon recheck with Dr NelsMeda Coffee has an upcoming appointment with Dr NelsMeda Coffeehin the next month. Pt denies any CP, SOB, dizziness, lightheadedness, HAs, or changes in his vision.   Pt has been taking lasix approximately every other day.   HLD Lab Results  Component Value Date   CHOL 145 02/24/2016   HDL 51 02/24/2016   LDLCALC 70 02/24/2016   TRIG 121 02/24/2016   CHOLHDL 2.8 02/24/2016  Takes lovastatin 20mg64m    DM Lab Results  Component Value Date   HGBA1C 7.8 01/08/2016   Lab Results  Component Value Date   MICROALBUR 39.6 08/25/2015     Pt has diabetic retinopathy. Due to history of complications, recommended target A1C to be under 7. Pt stated at last visit that he wanted to stay on same medication and work on diet and exercise. Pt has been exercising 3-4 time a week. Pt has been lifting weights and also riding an  exercise bike for 45 minutes at a time 3 times a week. Pt last met with a nutritionist a year ago and states that he has not been doing well with his diet and struggles with portion sizes. Pt states he "eats out more than [he] should." Pt does not think seeing a nutritionist helps. Pt takes 10mg 61mizide BID and 500mg m11mrmin BID. Pt had GI intolerance with higher doses of metformin. Pt denies having any diarrhea or any other GI intolerance at current dose.    Wt Readings from Last 3 Encounters:  04/22/16 204 lb 9.6 oz (92.8 kg)  01/08/16 199 lb 9.6 oz (90.5 kg)  10/15/15 198 lb 12.8 oz (90.2 kg)  Body mass index is 36.24 kg/m.  Lab Results  Component Value Date   MICROALBUR 39.6 08/25/2015        Patient Active Problem List   Diagnosis Date Noted  . Hypertensive heart disease 07/14/2015  . Lower extremity edema 07/14/2015  . Aortic stenosis 01/09/2015  . OSA on CPAP 12/12/2014  . Hypersomnia with sleep apnea 12/12/2014  . Obstructive sleep apnea (adult) (pediatric) 02/04/2014  . 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 Obesity 02/04/2014  . Mitral regurgitation and aortic stenosis 01/02/2014  . DOE (dyspnea on exertion) 11/26/2013  . Gout 08/12/2011  . Hypertension 08/12/2011  . High cholesterol  08/12/2011  . OSA (obstructive sleep apnea) 08/12/2011  . Diabetes type 2, uncontrolled ([place]) 08/12/2011   Past Medical History:  Diagnosis Date  . Diabetes mellitus without complication ([place])   . Gout   . Heart murmur   . High cholesterol   . Hypertension   . Sleep apnea    Past Surgical History:  Procedure Laterality Date  . COLONOSCOPY  2008   Allergies  Allergen Reactions  . Penicillins Other (See Comments)    Passed out  . Tramadol Other (See Comments)    Hiccups and vomiting  . Cortisone     Hiccups and vomiting  . Lisinopril Cough   Prior to Admission medications   Medication Sig Start Date End Date Taking? Authorizing Provider  amLODipine (NORVASC) 10 MG  tablet TAKE 1 TABLET (10 MG TOTAL) BY MOUTH DAILY. 08/04/15  Yes [name], MD  aspirin EC 81 MG tablet Take 1 tablet (81 mg total) by mouth daily. 05/31/13  Yes Harvie Heck, PA-C  colchicine 0.6 MG tablet Take 1-2 tablets (0.6-1.2 mg total) by mouth daily as needed (for gout flare). 2 po at onset of flair and then repeat in 1 h for acute gout attack then 1 po qd until pain free 05/31/13  Yes Harvie Heck, PA-C  furosemide (LASIX) 20 MG tablet Take 1 tablet (20 mg total) by mouth daily as needed for edema. 03/01/16  Yes [name], MD  glipiZIDE (GLUCOTROL) 10 MG tablet TAKE 1 TABLET BY MOUTH TWICE A DAY BEFORE MEALS. 03/26/16  Yes [name], MD  isosorbide mononitrate (IMDUR) 30 MG 24 hr tablet Take 1 tablet (30 mg total) by mouth daily. 08/19/15  Yes [name], MD  losartan (COZAAR) 100 MG tablet TAKE 1 TABLET (100 MG TOTAL) BY MOUTH DAILY. 08/04/15  Yes [name], MD  lovastatin (MEVACOR) 20 MG tablet TAKE 1 TABLET BY MOUTH EVERY DAY AT BEDTIME. 01/08/16  Yes [name], MD  metFORMIN (GLUCOPHAGE) 500 MG tablet TAKE 1 TABLET (500 MG TOTAL) BY MOUTH 2 (TWO) TIMES DAILY WITH A MEAL. 02/20/16  Yes [name], MD  metoprolol (LOPRESSOR) 50 MG tablet Take 1 tablet (50 mg total) by mouth 2 (two) times daily. 01/08/16  Yes [name], MD   Social History   Social History  . Marital status: Married    Spouse name: [name]  . Number of children: 3  . Years of education: 65   Occupational History  . Musician    Social History Main Topics  . Smoking status: Former Smoker    Packs/day: 2.00    Years: 15.00    Types: Cigarettes    Quit date: 07/26/1998  . Smokeless tobacco: Never Used  . Alcohol use 0.0 oz/week     Comment: occasionally  . Drug use: No  . Sexual activity: Yes   Other Topics Concern  . Not on file   Social History Narrative   Patient is married [name]) and lives at home with his wife.   Patient has three adult children.   Patient  is retired.   Patient is a Research officer, political party.   Patient is right-handed.   Patient drinks one cup of coffee daily and 2-3 sodas per day.            Review of Systems  Constitutional: Negative for fatigue and unexpected weight change.  Eyes: Negative for visual disturbance.  Respiratory: Negative for cough, chest tightness and shortness of breath.  Cardiovascular: Negative for chest pain, palpitations and leg swelling.  Gastrointestinal: Negative for abdominal pain, blood in stool and diarrhea.  Neurological: Negative for dizziness, light-headedness and headaches.       Objective:   Physical Exam  Constitutional: He is oriented to person, place, and time. He appears well-developed and well-nourished.  HENT:  Head: Normocephalic and atraumatic.  Eyes: EOM are normal. Pupils are equal, round, and reactive to light.  Neck: No JVD present. Carotid bruit is not present.  Cardiovascular: Normal rate and regular rhythm.  Exam reveals no gallop and no friction rub.   Murmur heard.  Systolic murmur is present with a grade of 1/6  Pulmonary/Chest: Effort normal and breath sounds normal. No accessory muscle usage. He has no decreased breath sounds. He has no wheezes. He has no rhonchi. He has no rales.  Abdominal: Normal appearance and bowel sounds are normal. He exhibits no distension and no mass. There is no tenderness. There is no rebound and no guarding.  Musculoskeletal: He exhibits no edema.  Neurological: He is alert and oriented to person, place, and time.  Skin: Skin is warm and dry.  Psychiatric: He has a normal mood and affect.  Vitals reviewed.   Vitals:   04/22/16 1031  BP: 128/78  Pulse: (!) 58  Resp: 16  Temp: 99 F (37.2 C)  TempSrc: Oral  SpO2: 98%  Weight: 204 lb 9.6 oz (92.8 kg)  Height: 5' 3"  (1.6 m)         Assessment & Plan:   Antonio Wells is a 70 y.o. male Type 2 diabetes mellitus with retinopathy, without long-term current use of insulin, macular  edema presence unspecified, unspecified laterality, unspecified retinopathy severity ([place]) - Plan: Hemoglobin A1C  - discussed concerns of persistent decreased control and hx of underlying retinopathy. Will recheck A1c, then consider adding other agent as unable to tolerate higher dose of metformin and elevated creatinine in past (depending on level, may need to stop metformin as well).   - diet recommendations given.   CKD (chronic kidney disease), unspecified stage - Plan: Basic metabolic panel  -repeat V8L, may need to hold metformin depending on reading.   Essential hypertension - Plan: Basic metabolic panel  - stable, no med changes for now.   No orders of the defined types were placed in this encounter.  Patient Instructions   I will check your kidney function today and if still elevated, we will need to stop metformin and change meds. If A1c still elevated, I would recommend additional medication to lessen damage to eyes, kidneys and other areas as discussed. If you attempt diet change as treatment - follow up in 6 weeks.  Can discuss this further after we receive your blood work results.  Follow up with cardiology next week to discuss blood pressure med changes and bring your meter to that visit to compare.    Tips for Eating Away From Home If You Have Diabetes Controlling your level of blood glucose, also known as blood sugar, can be challenging. It can be even more difficult when you do not prepare your own meals. The following tips can help you manage your diabetes when you eat away from home. PLANNING AHEAD Plan ahead if you know you will be eating away from home:  Ask your health care provider how to time meals and medicine if you are taking insulin.  Make a list of restaurants near you that offer healthy choices. If they have a carry-out menu,  take it home and plan what you will order ahead of time.  Look up the restaurant you want to eat at online. Many chain and fast-food  restaurants list nutritional information online. Use this information to choose the healthiest options and to calculate how many carbohydrates will be in your meal.  Use a carbohydrate-counting book or mobile app to look up the carbohydrate content and serving size of the foods you want to eat.  Become familiar with serving sizes and learn to recognize how many servings are in a portion. This will allow you to estimate how many carbohydrates you can eat. FREE FOODS A "free food" is any food or drink that has less than 5 g of carbohydrates per serving. Free foods include:  Many vegetables.  Hard boiled eggs.  Nuts or seeds.  Olives.  Cheeses.  Meats. These types of foods make good appetizer choices and are often available at salad bars. Lemon juice, vinegar, or a low-calorie salad dressing of fewer than 20 calories per serving can be used as a "free" salad dressing.  CHOICES TO REDUCE CARBOHYDRATES  Substitute nonfat sweetened yogurt with a sugar-free yogurt. Yogurt made from soy milk may also be used, but you will still want a sugar-free or plain option to choose a lower carbohydrate amount.  Ask your server to take away the bread basket or chips from your table.  Order fresh fruit. A salad bar often offers fresh fruit choices. Avoid canned fruit because it is usually packed in sugar or syrup.  Order a salad, and eat it without dressing. Or, create a "free" salad dressing.  Ask for substitutions. For example, instead of [place] fries, request an order of a vegetable such as salad, green beans, or broccoli. OTHER TIPS   If you take insulin, take the insulin once your food arrives to your table. This will ensure your insulin and food are timed correctly.  Ask your server about the portion size before your order, and ask for a take-out box if the portion has more servings than you should have. When your food comes, leave the amount you should have on the plate, and put the rest in the  take-out box.  Consider splitting an entree with someone and ordering a side salad.   This information is not intended to replace advice given to you by your health care provider. Make sure you discuss any questions you have with your health care provider.   Document Released: 07/12/2005 Document Revised: 04/02/2015 Document Reviewed: 10/09/2013 Elsevier Interactive Patient Education 2016 [employer]. Diabetes Mellitus and Food It is important for you to manage your blood sugar (glucose) level. Your blood glucose level can be greatly affected by what you eat. Eating healthier foods in the appropriate amounts throughout the day at about the same time each day will help you control your blood glucose level. It can also help slow or prevent worsening of your diabetes mellitus. Healthy eating may even help you improve the level of your blood pressure and reach or maintain a healthy weight.  General recommendations for healthful eating and cooking habits include:  Eating meals and snacks regularly. Avoid going long periods of time without eating to lose weight.  Eating a diet that consists mainly of plant-based foods, such as fruits, vegetables, nuts, legumes, and whole grains.  Using low-heat cooking methods, such as baking, instead of high-heat cooking methods, such as deep frying. Work with your dietitian to make sure you understand how to use the Nutrition Facts  information on food labels. HOW CAN FOOD AFFECT ME? Carbohydrates Carbohydrates affect your blood glucose level more than any other type of food. Your dietitian will help you determine how many carbohydrates to eat at each meal and teach you how to count carbohydrates. Counting carbohydrates is important to keep your blood glucose at a healthy level, especially if you are using insulin or taking certain medicines for diabetes mellitus. Alcohol Alcohol can cause sudden decreases in blood glucose (hypoglycemia), especially if you use  insulin or take certain medicines for diabetes mellitus. Hypoglycemia can be a life-threatening condition. Symptoms of hypoglycemia (sleepiness, dizziness, and disorientation) are similar to symptoms of having too much alcohol.  If your health care provider has given you approval to drink alcohol, do so in moderation and use the following guidelines:  Women should not have more than one drink per day, and men should not have more than two drinks per day. One drink is equal to:  12 oz of beer.  5 oz of wine.  1 oz of hard liquor.  Do not drink on an empty stomach.  Keep yourself hydrated. Have water, diet soda, or unsweetened iced tea.  Regular soda, juice, and other mixers might contain a lot of carbohydrates and should be counted. WHAT FOODS ARE NOT RECOMMENDED? As you make food choices, it is important to remember that all foods are not the same. Some foods have fewer nutrients per serving than other foods, even though they might have the same number of calories or carbohydrates. It is difficult to get your body what it needs when you eat foods with fewer nutrients. Examples of foods that you should avoid that are high in calories and carbohydrates but low in nutrients include:  Trans fats (most processed foods list trans fats on the Nutrition Facts label).  Regular soda.  Juice.  Candy.  Sweets, such as cake, pie, doughnuts, and cookies.  Fried foods. WHAT FOODS CAN I EAT? Eat nutrient-rich foods, which will nourish your body and keep you healthy. The food you should eat also will depend on several factors, including:  The calories you need.  The medicines you take.  Your weight.  Your blood glucose level.  Your blood pressure level.  Your cholesterol level. You should eat a variety of foods, including:  Protein.  Lean cuts of meat.  Proteins low in saturated fats, such as fish, egg whites, and beans. Avoid processed meats.  Fruits and vegetables.  Fruits and  vegetables that may help control blood glucose levels, such as apples, mangoes, and yams.  Dairy products.  Choose fat-free or low-fat dairy products, such as milk, yogurt, and cheese.  Grains, bread, pasta, and rice.  Choose whole grain products, such as multigrain bread, whole oats, and brown rice. These foods may help control blood pressure.  Fats.  Foods containing healthful fats, such as nuts, avocado, olive oil, canola oil, and fish. DOES EVERYONE WITH DIABETES MELLITUS HAVE THE SAME MEAL PLAN? Because every person with diabetes mellitus is different, there is not one meal plan that works for everyone. It is very important that you meet with a dietitian who will help you create a meal plan that is just right for you.   This information is not intended to replace advice given to you by your health care provider. Make sure you discuss any questions you have with your health care provider.   Document Released: 04/08/2005 Document Revised: 08/02/2014 Document Reviewed: 06/08/2013 Elsevier Interactive Patient Education [employer].  IF you received an x-ray today, you will receive an invoice from [facility]. Please contact [facility] at [phone] with questions or concerns regarding your invoice.   IF you received labwork today, you will receive an invoice from [employer]. Please contact [facility] at [phone] with questions or concerns regarding your invoice.   Our billing staff will not be able to assist you with questions regarding bills from these companies.  You will be contacted with the lab results as soon as they are available. The fastest way to get your results is to activate your My Chart account. Instructions are located on the last page of this paperwork. If you have not heard from [place] regarding the results in 2 weeks, please contact this office.        I personally performed the services described in  this documentation, which was scribed in my presence. The recorded information has been reviewed and considered, and addended by me as needed.   Signed,   [name], MD [facility].  04/24/16 12:48 AM

## 2016-04-22 NOTE — Patient Instructions (Addendum)
I will check your kidney function today and if still elevated, we will need to stop metformin and change meds. If A1c still elevated, I would recommend additional medication to lessen damage to eyes, kidneys and other areas as discussed. If you attempt diet change as treatment - follow up in 6 weeks.  Can discuss this further after we receive your blood work results.  Follow up with cardiology next week to discuss blood pressure med changes and bring your meter to that visit to compare.    Tips for Eating Away From Home If You Have Diabetes Controlling your level of blood glucose, also known as blood sugar, can be challenging. It can be even more difficult when you do not prepare your own meals. The following tips can help you manage your diabetes when you eat away from home. PLANNING AHEAD Plan ahead if you know you will be eating away from home:  Ask your health care provider how to time meals and medicine if you are taking insulin.  Make a list of restaurants near you that offer healthy choices. If they have a carry-out menu, take it home and plan what you will order ahead of time.  Look up the restaurant you want to eat at online. Many chain and fast-food restaurants list nutritional information online. Use this information to choose the healthiest options and to calculate how many carbohydrates will be in your meal.  Use a carbohydrate-counting book or mobile app to look up the carbohydrate content and serving size of the foods you want to eat.  Become familiar with serving sizes and learn to recognize how many servings are in a portion. This will allow you to estimate how many carbohydrates you can eat. FREE FOODS A "free food" is any food or drink that has less than 5 g of carbohydrates per serving. Free foods include:  Many vegetables.  Hard boiled eggs.  Nuts or seeds.  Olives.  Cheeses.  Meats. These types of foods make good appetizer choices and are often available at  salad bars. Lemon juice, vinegar, or a low-calorie salad dressing of fewer than 20 calories per serving can be used as a "free" salad dressing.  CHOICES TO REDUCE CARBOHYDRATES  Substitute nonfat sweetened yogurt with a sugar-free yogurt. Yogurt made from soy milk may also be used, but you will still want a sugar-free or plain option to choose a lower carbohydrate amount.  Ask your server to take away the bread basket or chips from your table.  Order fresh fruit. A salad bar often offers fresh fruit choices. Avoid canned fruit because it is usually packed in sugar or syrup.  Order a salad, and eat it without dressing. Or, create a "free" salad dressing.  Ask for substitutions. For example, instead of [place] fries, request an order of a vegetable such as salad, green beans, or broccoli. OTHER TIPS   If you take insulin, take the insulin once your food arrives to your table. This will ensure your insulin and food are timed correctly.  Ask your server about the portion size before your order, and ask for a take-out box if the portion has more servings than you should have. When your food comes, leave the amount you should have on the plate, and put the rest in the take-out box.  Consider splitting an entree with someone and ordering a side salad.   This information is not intended to replace advice given to you by your health care provider. Make sure you  discuss any questions you have with your health care provider.   Document Released: 07/12/2005 Document Revised: 04/02/2015 Document Reviewed: 10/09/2013 Elsevier Interactive Patient Education 2016 [employer]. Diabetes Mellitus and Food It is important for you to manage your blood sugar (glucose) level. Your blood glucose level can be greatly affected by what you eat. Eating healthier foods in the appropriate amounts throughout the day at about the same time each day will help you control your blood glucose level. It can also help slow or  prevent worsening of your diabetes mellitus. Healthy eating may even help you improve the level of your blood pressure and reach or maintain a healthy weight.  General recommendations for healthful eating and cooking habits include:  Eating meals and snacks regularly. Avoid going long periods of time without eating to lose weight.  Eating a diet that consists mainly of plant-based foods, such as fruits, vegetables, nuts, legumes, and whole grains.  Using low-heat cooking methods, such as baking, instead of high-heat cooking methods, such as deep frying. Work with your dietitian to make sure you understand how to use the Nutrition Facts information on food labels. HOW CAN FOOD AFFECT ME? Carbohydrates Carbohydrates affect your blood glucose level more than any other type of food. Your dietitian will help you determine how many carbohydrates to eat at each meal and teach you how to count carbohydrates. Counting carbohydrates is important to keep your blood glucose at a healthy level, especially if you are using insulin or taking certain medicines for diabetes mellitus. Alcohol Alcohol can cause sudden decreases in blood glucose (hypoglycemia), especially if you use insulin or take certain medicines for diabetes mellitus. Hypoglycemia can be a life-threatening condition. Symptoms of hypoglycemia (sleepiness, dizziness, and disorientation) are similar to symptoms of having too much alcohol.  If your health care provider has given you approval to drink alcohol, do so in moderation and use the following guidelines:  Women should not have more than one drink per day, and men should not have more than two drinks per day. One drink is equal to:  12 oz of beer.  5 oz of wine.  1 oz of hard liquor.  Do not drink on an empty stomach.  Keep yourself hydrated. Have water, diet soda, or unsweetened iced tea.  Regular soda, juice, and other mixers might contain a lot of carbohydrates and should be  counted. WHAT FOODS ARE NOT RECOMMENDED? As you make food choices, it is important to remember that all foods are not the same. Some foods have fewer nutrients per serving than other foods, even though they might have the same number of calories or carbohydrates. It is difficult to get your body what it needs when you eat foods with fewer nutrients. Examples of foods that you should avoid that are high in calories and carbohydrates but low in nutrients include:  Trans fats (most processed foods list trans fats on the Nutrition Facts label).  Regular soda.  Juice.  Candy.  Sweets, such as cake, pie, doughnuts, and cookies.  Fried foods. WHAT FOODS CAN I EAT? Eat nutrient-rich foods, which will nourish your body and keep you healthy. The food you should eat also will depend on several factors, including:  The calories you need.  The medicines you take.  Your weight.  Your blood glucose level.  Your blood pressure level.  Your cholesterol level. You should eat a variety of foods, including:  Protein.  Lean cuts of meat.  Proteins low in saturated fats, such  as fish, egg whites, and beans. Avoid processed meats.  Fruits and vegetables.  Fruits and vegetables that may help control blood glucose levels, such as apples, mangoes, and yams.  Dairy products.  Choose fat-free or low-fat dairy products, such as milk, yogurt, and cheese.  Grains, bread, pasta, and rice.  Choose whole grain products, such as multigrain bread, whole oats, and brown rice. These foods may help control blood pressure.  Fats.  Foods containing healthful fats, such as nuts, avocado, olive oil, canola oil, and fish. DOES EVERYONE WITH DIABETES MELLITUS HAVE THE SAME MEAL PLAN? Because every person with diabetes mellitus is different, there is not one meal plan that works for everyone. It is very important that you meet with a dietitian who will help you create a meal plan that is just right for you.    This information is not intended to replace advice given to you by your health care provider. Make sure you discuss any questions you have with your health care provider.   Document Released: 04/08/2005 Document Revised: 08/02/2014 Document Reviewed: 06/08/2013 Elsevier Interactive Patient Education 2016 [employer].      IF you received an x-ray today, you will receive an invoice from [facility]. Please contact [facility] at [phone] with questions or concerns regarding your invoice.   IF you received labwork today, you will receive an invoice from [employer]. Please contact [facility] at [phone] with questions or concerns regarding your invoice.   Our billing staff will not be able to assist you with questions regarding bills from these companies.  You will be contacted with the lab results as soon as they are available. The fastest way to get your results is to activate your My Chart account. Instructions are located on the last page of this paperwork. If you have not heard from [place] regarding the results in 2 weeks, please contact this office.

## 2016-04-23 LAB — HEMOGLOBIN A1C
HEMOGLOBIN A1C: 7.6 % — AB (ref <5.7)
MEAN PLASMA GLUCOSE: 171 mg/dL

## 2016-04-30 ENCOUNTER — Encounter: Payer: Self-pay | Admitting: Cardiology

## 2016-04-30 ENCOUNTER — Ambulatory Visit (INDEPENDENT_AMBULATORY_CARE_PROVIDER_SITE_OTHER): Payer: Medicare Other | Admitting: Cardiology

## 2016-04-30 VITALS — BP 126/76 | HR 60 | Ht 63.0 in | Wt 201.0 lb

## 2016-04-30 DIAGNOSIS — E7849 Other hyperlipidemia: Secondary | ICD-10-CM

## 2016-04-30 DIAGNOSIS — I10 Essential (primary) hypertension: Secondary | ICD-10-CM | POA: Diagnosis not present

## 2016-04-30 DIAGNOSIS — I119 Hypertensive heart disease without heart failure: Secondary | ICD-10-CM | POA: Diagnosis not present

## 2016-04-30 DIAGNOSIS — E784 Other hyperlipidemia: Secondary | ICD-10-CM | POA: Diagnosis not present

## 2016-04-30 DIAGNOSIS — R609 Edema, unspecified: Secondary | ICD-10-CM

## 2016-04-30 DIAGNOSIS — E782 Mixed hyperlipidemia: Secondary | ICD-10-CM

## 2016-04-30 DIAGNOSIS — I350 Nonrheumatic aortic (valve) stenosis: Secondary | ICD-10-CM

## 2016-04-30 DIAGNOSIS — I080 Rheumatic disorders of both mitral and aortic valves: Secondary | ICD-10-CM

## 2016-04-30 NOTE — Patient Instructions (Signed)

## 2016-04-30 NOTE — Progress Notes (Signed)
Patient ID: MICHALE FATULA, male   DOB: Jul 25, 1946, 70 y.o.   MRN: [id number]    Patient Name: Antonio Wells Date of Encounter: 04/30/2016  Primary Care Provider:  [name], MD Primary Cardiologist:  Ena Dawley  Problem List   Past Medical History:  Diagnosis Date  . Diabetes mellitus without complication ([place])   . Gout   . Heart murmur   . High cholesterol   . Hypertension   . Sleep apnea    Past Surgical History:  Procedure Laterality Date  . COLONOSCOPY  2008   Allergies  Allergies  Allergen Reactions  . Penicillins Other (See Comments)    Passed out  . Tramadol Other (See Comments)    Hiccups and vomiting  . Cortisone     Hiccups and vomiting  . Lisinopril Cough   Chief complain: DOE  HPI  A pleasant 70 year-old male with prior medical history of insulin-dependent diabetes mellitus known for the last 5 years, hypertension diagnosed 15 years ago, hyperlipidemia, obstructive sleep apnea, and prior history of smoking, quit 15 years ago who is coming with concern of dyspnea on exertion.the patient is currently retired and states that he used to exercise on a regular basis. Specifically he was use a stationary bike and exercise for up to an hour 3 times a week. However shows he also states that lately he hasn't been that active and noticed that he gets short of breath easily. This is a episodic sometimes he can exercise no problem and then other times he gets short of breath there quickly for example after walking one or 2 flight of stairs. He is very compliant with his medications. He quit smoking 15 years ago. He was prescribed a CPAP machine for obstructive sleep apnea but is not using it. He denies any chest pain left arm pain jaw pain or back pain, no palpitations or syncope no orthopnea or lower extremity edema.  The patient also states that his blood pressure has been uncontrolled lately.  04/30/2016 - This is 6 months follow-up, patient states that  he feels great, he is on and off lower extremity edema mostly toward the end of the day but hasn't been using his furosemide. He is compliant with his meds, and has no muscle pain from statins. His blood pressure has been well controlled, he denies any chest pain, dyspnea on exertion, orthopnea paroxysmal nocturnal dyspnea palpitations or syncope.   Home Medications  Prior to Admission medications   Medication Sig Start Date End Date Taking? Authorizing Provider  aspirin EC 81 MG tablet Take 1 tablet (81 mg total) by mouth daily. 05/31/13  Yes Lauren Burnetta Sabin, PA-C  colchicine 0.6 MG tablet Take 1-2 tablets (0.6-1.2 mg total) by mouth daily as needed (for gout flare). 2 po at onset of flair and then repeat in 1 h for acute gout attack then 1 po qd until pain free 05/31/13  Yes Lauren Burnetta Sabin, PA-C  felodipine (PLENDIL) 10 MG 24 hr tablet Take 1 tablet (10 mg total) by mouth daily. 07/27/13  Yes Mancel Bale, PA-C  glipiZIDE (GLUCOTROL) 10 MG tablet Take 1 tablet (10 mg total) by mouth 2 (two) times daily before a meal. 07/27/13  Yes Mancel Bale, PA-C  glucose blood test strip Use as directed 05/31/13  Yes Lauren Burnetta Sabin, PA-C  losartan-hydrochlorothiazide (HYZAAR) 100-12.5 MG per tablet Take 1 tablet by mouth daily. 10/24/13  Yes [name], MD  lovastatin (MEVACOR) 20 MG tablet  Take 1 tablet (20 mg total) by mouth at bedtime. 07/27/13  Yes Mancel Bale, PA-C  metFORMIN (GLUCOPHAGE) 500 MG tablet Take 1 tablet (500 mg total) by mouth 2 (two) times daily with a meal. 07/27/13  Yes Mancel Bale, PA-C  metoprolol succinate (TOPROL-XL) 100 MG 24 hr tablet Take 1 tablet (100 mg total) by mouth daily. 07/27/13  Yes Mancel Bale, PA-C    Family History  Family History  Problem Relation Age of Onset  . Hypertension Mother   . Hypertension Father   . Cancer Brother     lung  . Stroke Brother   . Colon cancer Neg Hx     Social History  Social History   Social History  . Marital status:  Married    Spouse name: [name]  . Number of children: 3  . Years of education: 62   Occupational History  . Musician    Social History Main Topics  . Smoking status: Former Smoker    Packs/day: 2.00    Years: 15.00    Types: Cigarettes    Quit date: 07/26/1998  . Smokeless tobacco: Never Used  . Alcohol use 0.0 oz/week     Comment: occasionally  . Drug use: No  . Sexual activity: Yes   Other Topics Concern  . Not on file   Social History Narrative   Patient is married [name]) and lives at home with his wife.   Patient has three adult children.   Patient is retired.   Patient is a Research officer, political party.   Patient is right-handed.   Patient drinks one cup of coffee daily and 2-3 sodas per day.           Review of Systems, as per HPI, otherwise negative General:  No chills, fever, night sweats or weight changes.  Cardiovascular:  No chest pain, dyspnea on exertion, edema, orthopnea, palpitations, paroxysmal nocturnal dyspnea. Dermatological: No rash, lesions/masses Respiratory: No cough, dyspnea Urologic: No hematuria, dysuria Abdominal:   No nausea, vomiting, diarrhea, bright red blood per rectum, melena, or hematemesis Neurologic:  No visual changes, wkns, changes in mental status. All other systems reviewed and are otherwise negative except as noted above.  Physical Exam  Blood pressure 126/76, pulse 60, height 5\' 3"  (1.6 m), weight 201 lb (91.2 kg).  General: Pleasant, NAD Psych: Normal affect. Neuro: Alert and oriented X 3. Moves all extremities spontaneously. HEENT: Normal  Neck: Supple without bruits or JVD. Lungs:  Resp regular and unlabored, CTA. Heart: RRR no s3, s4, or 3/6 systolic murmur. Abdomen: Soft, non-tender, non-distended, BS + x 4.  Extremities: No clubbing, cyanosis or edema. DP/PT/Radials 2+ and equal bilaterally.  Labs:  No results for input(s): CKTOTAL, CKMB, TROPONINI in the last 72 hours. Lab Results  Component Value Date   WBC 4.1 11/22/2014     HGB 13.3 11/22/2014   HCT 39.9 11/22/2014   MCV 92.8 11/22/2014   PLT 223 11/22/2014    No results found for: DDIMER Invalid input(s): POCBNP    Component Value Date/Time   NA 142 04/22/2016 1141   K 4.4 04/22/2016 1141   CL 103 04/22/2016 1141   CO2 27 04/22/2016 1141   GLUCOSE 151 (H) 04/22/2016 1141   BUN 12 04/22/2016 1141   CREATININE 1.34 (H) 04/22/2016 1141   CALCIUM 9.5 04/22/2016 1141   PROT 6.5 02/24/2016 0749   ALBUMIN 4.5 02/24/2016 0749   AST 15 02/24/2016 0749   ALT 19 02/24/2016 0749  ALKPHOS 48 02/24/2016 0749   BILITOT 0.4 02/24/2016 0749   GFRNONAA 53 (L) 10/15/2015 1638   GFRAA 61 10/15/2015 1638   Lab Results  Component Value Date   CHOL 145 02/24/2016   HDL 51 02/24/2016   LDLCALC 70 02/24/2016   TRIG 121 02/24/2016    Accessory Clinical Findings  Echocardiogram -12/20/2013   - Left ventricle: The cavity size was normal. There was mild concentric hypertrophy. Systolic function was normal. The estimated ejection fraction was in the range of 60% to 65%. Wall motion was normal; there were no regional wall motion abnormalities. Doppler parameters are consistent with abnormal left ventricular relaxation (grade 1 diastolic dysfunction). The E/e&' ratio is >15, suggesting elevated LV filling pressure. - Aortic valve: Trileaflet; mildly calcified leaflets. There is mild aortic stenosis. Peak and mean gradients of 18 and 13 mmHg, respectively. Based on an LVOT diameter of 2.2, the calculated AVA is 1.5-1.6 cm2. - Mitral valve: There was mild regurgitation. - Atrial septum: No defect or patent foramen ovale was identified. - Tricuspid valve: There was mild regurgitation. - Pulmonary arteries: PA peak pressure: 37 mm Hg (S). Mild pulmonary hypertension.  Impressions:  - LVEF of 60-65%, mild concentric LVH. Diastolic dysfunction with elevated LV filling pressure. Mild aortic stenosis - AVA around 1.6 cm2. Mild pulmonary hypertension - RVSP 37  mmHg.  TTE: 12/2014 - Left ventricle: The cavity size was normal. Systolic function was normal. The estimated ejection fraction was in the range of 60% to 65%. Wall motion was normal; there were no regional wall motion abnormalities. Indeterminate left ventricular filling pressure by Doppler parameters. - Aortic valve: There was very mild stenosis. Valve area (VTI): 1.67 cm^2. Valve area (Vmax): 1.49 cm^2. Valve area (Vmean): 1.29 cm^2. - Pulmonary arteries: PA peak pressure: 32 mm Hg (S).  ECG - Sinus rhythm, nonspecific ST-T wave several days.  Lexiscan nuclear stress test; 12/21/13 Quantitative Gated Spect Images  QGS EDV: 106 ml  QGS ESV: 50 ml  Impression  Exercise Capacity: Lexiscan with low level exercise.  BP Response: Normal blood pressure response.  Clinical Symptoms: There is dyspnea.  ECG Impression: Nonspecific T wave abnormality during exercise and Lexiscan infusion and in recovery  Comparison with Prior Nuclear Study: No images to compare  Overall Impression: Normal stress nuclear study.  LV Ejection Fraction: 53%. LV Wall Motion: NL LV Function; NL Wall Motion  EKG performed today 04/30/2016 shows normal sinus rhythm and early repolarization unchanged from the prior EKG.  Assessment & Plan  70 year old male with multiple risk factors for coronary artery disease including prior smoking, obesity, insulin-dependent diabetes mellitus, hyperlipidemia, uncontrolled hypertension and untreated obstructive sleep apnea who is coming with concerns of   1. Dyspnea on exertion. He underwent an exercise nuclear stress test that was negative for ischemia but showed hypertensive response to stress. We added Imdur 30 mg daily with improvement of his symptoms, he continues to exercise about 4 times a week without any symptoms..  2. 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   3. Lower extremity edema -   Episodic, he is again advised to use Lasix when necessary for lower extremity edema.   4. Murmur - mild mitral regurgitation and mild aortic stenosis on the last echo in June 2016, his murmur sounds about the same is no need to repeat right now.  5. Hyperlipidemia - TAG 266, repeat lipids significantly improved reckless rides 121, LDL 70 and HDL 51.  Followup in 6 months.   Ena Dawley, MD, [facility] 04/30/2016, 9:46 AM

## 2016-05-04 MED ORDER — SITAGLIPTIN PHOSPHATE 50 MG PO TABS: 50.0000 mg | ORAL_TABLET | Freq: Every day | ORAL | 2 refills | Status: DC

## 2016-05-04 NOTE — Addendum Note (Signed)
Addended by: [name] R on: 05/04/2016 04:20 PM   Modules accepted: Orders

## 2016-05-11 DIAGNOSIS — B353 Tinea pedis: Secondary | ICD-10-CM | POA: Diagnosis not present

## 2016-05-11 DIAGNOSIS — B351 Tinea unguium: Secondary | ICD-10-CM | POA: Diagnosis not present

## 2016-05-11 DIAGNOSIS — L602 Onychogryphosis: Secondary | ICD-10-CM | POA: Diagnosis not present

## 2016-05-20 ENCOUNTER — Other Ambulatory Visit: Payer: Self-pay | Admitting: Family Medicine

## 2016-05-20 DIAGNOSIS — IMO0001 Reserved for inherently not codable concepts without codable children: Secondary | ICD-10-CM

## 2016-05-20 DIAGNOSIS — E1165 Type 2 diabetes mellitus with hyperglycemia: Principal | ICD-10-CM

## 2016-05-31 ENCOUNTER — Other Ambulatory Visit: Payer: Self-pay | Admitting: Cardiology

## 2016-07-12 ENCOUNTER — Other Ambulatory Visit: Payer: Self-pay | Admitting: Family Medicine

## 2016-07-14 DIAGNOSIS — B353 Tinea pedis: Secondary | ICD-10-CM | POA: Diagnosis not present

## 2016-07-14 DIAGNOSIS — L602 Onychogryphosis: Secondary | ICD-10-CM | POA: Diagnosis not present

## 2016-07-14 DIAGNOSIS — Z23 Encounter for immunization: Secondary | ICD-10-CM | POA: Diagnosis not present

## 2016-07-14 DIAGNOSIS — B351 Tinea unguium: Secondary | ICD-10-CM | POA: Diagnosis not present

## 2016-07-14 NOTE — Telephone Encounter (Signed)
03/2016 last ov 03/2016 last labs

## 2016-07-22 ENCOUNTER — Ambulatory Visit: Payer: Medicare Other | Admitting: Family Medicine

## 2016-07-27 ENCOUNTER — Other Ambulatory Visit: Payer: Self-pay | Admitting: Cardiology

## 2016-07-29 ENCOUNTER — Ambulatory Visit: Payer: Medicare Other | Admitting: Family Medicine

## 2016-08-05 ENCOUNTER — Ambulatory Visit (INDEPENDENT_AMBULATORY_CARE_PROVIDER_SITE_OTHER): Payer: Medicare Other | Admitting: Family Medicine

## 2016-08-05 VITALS — BP 156/70 | HR 65 | Temp 98.7°F | Resp 18 | Ht 63.0 in | Wt 201.0 lb

## 2016-08-05 DIAGNOSIS — M5431 Sciatica, right side: Secondary | ICD-10-CM | POA: Diagnosis not present

## 2016-08-05 DIAGNOSIS — E11319 Type 2 diabetes mellitus with unspecified diabetic retinopathy without macular edema: Secondary | ICD-10-CM

## 2016-08-05 DIAGNOSIS — I10 Essential (primary) hypertension: Secondary | ICD-10-CM

## 2016-08-05 DIAGNOSIS — N189 Chronic kidney disease, unspecified: Secondary | ICD-10-CM

## 2016-08-05 DIAGNOSIS — E1165 Type 2 diabetes mellitus with hyperglycemia: Secondary | ICD-10-CM

## 2016-08-05 MED ORDER — METFORMIN HCL 500 MG PO TABS: ORAL_TABLET | ORAL | 1 refills | Status: DC

## 2016-08-05 MED ORDER — GLIPIZIDE 10 MG PO TABS: ORAL_TABLET | ORAL | 1 refills | Status: DC

## 2016-08-05 NOTE — Patient Instructions (Addendum)
Please ask your pharmacist to look at other medications comparable to Januvia to see if there are less costly options.  We need to get your A1C  below 7, especially with retinopathy. I will check your A1c, but then can discuss options.   Your back pain was likely due to sciatica. See information below as well as exercises. If those cause any pain, stop right away and if symptoms return, return to discuss further and possible x-rays.  Monitor your blood pressure outside of office and if remaining over 140/90, return here or discuss with your cardiologist.  Recheck in 3 months.    Sciatica Sciatica is pain, numbness, weakness, or tingling along the path of the sciatic nerve. The sciatic nerve starts in the lower back and runs down the back of each leg. The nerve controls the muscles in the lower leg and in the back of the knee. It also provides feeling (sensation) to the back of the thigh, the lower leg, and the sole of the foot. Sciatica is a symptom of another medical condition that pinches or puts pressure on the sciatic nerve. Generally, sciatica only affects one side of the body. Sciatica usually goes away on its own or with treatment. In some cases, sciatica may keep coming back (recur). What are the causes? This condition is caused by pressure on the sciatic nerve, or pinching of the sciatic nerve. This may be the result of:  A disk in between the bones of the spine (vertebrae) bulging out too far (herniated disk).  Age-related changes in the spinal disks (degenerative disk disease).  A pain disorder that affects a muscle in the buttock (piriformis syndrome).  Extra bone growth (bone spur) near the sciatic nerve.  An injury or break (fracture) of the pelvis.  Pregnancy.  Tumor (rare). What increases the risk? The following factors may make you more likely to develop this condition:  Playing sports that place pressure or stress on the spine, such as football or weight  lifting.  Having poor strength and flexibility.  A history of back injury.  A history of back surgery.  Sitting for long periods of time.  Doing activities that involve repetitive bending or lifting.  Obesity. What are the signs or symptoms? Symptoms can vary from mild to very severe, and they may include:  Any of these problems in the lower back, leg, hip, or buttock:  Mild tingling or dull aches.  Burning sensations.  Sharp pains.  Numbness in the back of the calf or the sole of the foot.  Leg weakness.  Severe back pain that makes movement difficult. These symptoms may get worse when you cough, sneeze, or laugh, or when you sit or stand for long periods of time. Being overweight may also make symptoms worse. In some cases, symptoms may recur over time. How is this diagnosed? This condition may be diagnosed based on:  Your symptoms.  A physical exam. Your health care provider may ask you to do certain movements to check whether those movements trigger your symptoms.  You may have tests, including:  Blood tests.  X-rays.  MRI.  CT scan. How is this treated? In many cases, this condition improves on its own, without any treatment. However, treatment may include:  Reducing or modifying physical activity during periods of pain.  Exercising and stretching to strengthen your abdomen and improve the flexibility of your spine.  Icing and applying heat to the affected area.  Medicines that help:  To relieve pain and  swelling.  To relax your muscles.  Injections of medicines that help to relieve pain, irritation, and inflammation around the sciatic nerve (steroids).  Surgery. Follow these instructions at home: Medicines  Take over-the-counter and prescription medicines only as told by your health care provider.  Do not drive or operate heavy machinery while taking prescription pain medicine. Managing pain  If directed, apply ice to the affected  area.  Put ice in a plastic bag.  Place a towel between your skin and the bag.  Leave the ice on for 20 minutes, 2-3 times a day.  After icing, apply heat to the affected area before you exercise or as often as told by your health care provider. Use the heat source that your health care provider recommends, such as a moist heat pack or a heating pad.  Place a towel between your skin and the heat source.  Leave the heat on for 20-30 minutes.  Remove the heat if your skin turns bright red. This is especially important if you are unable to feel pain, heat, or cold. You may have a greater risk of getting burned. Activity  Return to your normal activities as told by your health care provider. Ask your health care provider what activities are safe for you.  Avoid activities that make your symptoms worse.  Take brief periods of rest throughout the day. Resting in a lying or standing position is usually better than sitting to rest.  When you rest for longer periods, mix in some mild activity or stretching between periods of rest. This will help to prevent stiffness and pain.  Avoid sitting for long periods of time without moving. Get up and move around at least one time each hour.  Exercise and stretch regularly, as told by your health care provider.  Do not lift anything that is heavier than 10 lb (4.5 kg) while you have symptoms of sciatica. When you do not have symptoms, you should still avoid heavy lifting, especially repetitive heavy lifting.  When you lift objects, always use proper lifting technique, which includes:  Bending your knees.  Keeping the load close to your body.  Avoiding twisting. General instructions  Use good posture.  Avoid leaning forward while sitting.  Avoid hunching over while standing.  Maintain a healthy weight. Excess weight puts extra stress on your back and makes it difficult to maintain good posture.  Wear supportive, comfortable shoes. Avoid  wearing high heels.  Avoid sleeping on a mattress that is too soft or too hard. A mattress that is firm enough to support your back when you sleep may help to reduce your pain.  Keep all follow-up visits as told by your health care provider. This is important. Contact a health care provider if:  You have pain that wakes you up when you are sleeping.  You have pain that gets worse when you lie down.  Your pain is worse than you have experienced in the past.  Your pain lasts longer than 4 weeks.  You experience unexplained weight loss. Get help right away if:  You lose control of your bowel or bladder (incontinence).  You have:  Weakness in your lower back, pelvis, buttocks, or legs that gets worse.  Redness or swelling of your back.  A burning sensation when you urinate. This information is not intended to replace advice given to you by your health care provider. Make sure you discuss any questions you have with your health care provider. Document Released: 07/06/2001 Document Revised:  12/16/2015 Document Reviewed: 03/21/2015 Elsevier Interactive Patient Education  2017 [place].  Sciatica Rehab Ask your health care provider which exercises are safe for you. Do exercises exactly as told by your health care provider and adjust them as directed. It is normal to feel mild stretching, pulling, tightness, or discomfort as you do these exercises, but you should stop right away if you feel sudden pain or your pain gets worse.Do not begin these exercises until told by your health care provider. Stretching and range of motion exercises These exercises warm up your muscles and joints and improve the movement and flexibility of your hips and your back. These exercises also help to relieve pain, numbness, and tingling. Exercise A: Sciatic nerve glide 1. Sit in a chair with your head facing down toward your chest. Place your hands behind your back. Let your shoulders slump  forward. 2. Slowly straighten one of your knees while you tilt your head back as if you are looking toward the ceiling. Only straighten your leg as far as you can without making your symptoms worse. 3. Hold for __________ seconds. 4. Slowly return to the starting position. 5. Repeat with your other leg. Repeat __________ times. Complete this exercise __________ times a day. Exercise B: Knee to chest with hip adduction and internal rotation 1. Lie on your back on a firm surface with both legs straight. 2. Bend one of your knees and move it up toward your chest until you feel a gentle stretch in your lower back and buttock. Then, move your knee toward the shoulder that is on the opposite side from your leg.  Hold your leg in this position by holding onto the front of your knee. 3. Hold for __________ seconds. 4. Slowly return to the starting position. 5. Repeat with your other leg. Repeat __________ times. Complete this exercise __________ times a day. Exercise C: Prone extension on elbows 1. Lie on your abdomen on a firm surface. A bed may be too soft for this exercise. 2. Prop yourself up on your elbows. 3. Use your arms to help lift your chest up until you feel a gentle stretch in your abdomen and your lower back.  This will place some of your body weight on your elbows. If this is uncomfortable, try stacking pillows under your chest.  Your hips should stay down, against the surface that you are lying on. Keep your hip and back muscles relaxed. 4. Hold for __________ seconds. 5. Slowly relax your upper body and return to the starting position. Repeat __________ times. Complete this exercise __________ times a day. Strengthening exercises These exercises build strength and endurance in your back. Endurance is the ability to use your muscles for a long time, even after they get tired. Exercise D: Pelvic tilt 1. Lie on your back on a firm surface. Bend your knees and keep your feet  flat. 2. Tense your abdominal muscles. Tip your pelvis up toward the ceiling and flatten your lower back into the floor.  To help with this exercise, you may place a small towel under your lower back and try to push your back into the towel. 3. Hold for __________ seconds. 4. Let your muscles relax completely before you repeat this exercise. Repeat __________ times. Complete this exercise __________ times a day. Exercise E: Alternating arm and leg raises 1. Get on your hands and knees on a firm surface. If you are on a hard floor, you may want to use padding to cushion your  knees, such as an exercise mat. 2. Line up your arms and legs. Your hands should be below your shoulders, and your knees should be below your hips. 3. Lift your left leg behind you. At the same time, raise your right arm and straighten it in front of you.  Do not lift your leg higher than your hip.  Do not lift your arm higher than your shoulder.  Keep your abdominal and back muscles tight.  Keep your hips facing the ground.  Do not arch your back.  Keep your balance carefully, and do not hold your breath. 4. Hold for __________ seconds. 5. Slowly return to the starting position and repeat with your right leg and your left arm. Repeat __________ times. Complete this exercise __________ times a day. Posture and body mechanics   Body mechanics refers to the movements and positions of your body while you do your daily activities. Posture is part of body mechanics. Good posture and healthy body mechanics can help to relieve stress in your body's tissues and joints. Good posture means that your spine is in its natural S-curve position (your spine is neutral), your shoulders are pulled back slightly, and your head is not tipped forward. The following are general guidelines for applying improved posture and body mechanics to your everyday activities. Standing   When standing, keep your spine neutral and your feet about  hip-width apart. Keep a slight bend in your knees. Your ears, shoulders, and hips should line up.  When you do a task in which you stand in one place for a long time, place one foot up on a stable object that is 2-4 inches (5-10 cm) high, such as a footstool. This helps keep your spine neutral. Sitting  When sitting, keep your spine neutral and keep your feet flat on the floor. Use a footrest, if necessary, and keep your thighs parallel to the floor. Avoid rounding your shoulders, and avoid tilting your head forward.  When working at a desk or a computer, keep your desk at a height where your hands are slightly lower than your elbows. Slide your chair under your desk so you are close enough to maintain good posture.  When working at a computer, place your monitor at a height where you are looking straight ahead and you do not have to tilt your head forward or downward to look at the screen. Resting   When lying down and resting, avoid positions that are most painful for you.  If you have pain with activities such as sitting, bending, stooping, or squatting (flexion-based activities), lie in a position in which your body does not bend very much. For example, avoid curling up on your side with your arms and knees near your chest (fetal position).  If you have pain with activities such as standing for a long time or reaching with your arms (extension-based activities), lie with your spine in a neutral position and bend your knees slightly. Try the following positions:  Lying on your side with a pillow between your knees.  Lying on your back with a pillow under your knees. Lifting   When lifting objects, keep your feet at least shoulder-width apart and tighten your abdominal muscles.  Bend your knees and hips and keep your spine neutral. It is important to lift using the strength of your legs, not your back. Do not lock your knees straight out.  Always ask for help to lift heavy or awkward  objects. This information is not  intended to replace advice given to you by your health care provider. Make sure you discuss any questions you have with your health care provider. Document Released: 07/12/2005 Document Revised: 03/18/2016 Document Reviewed: 03/28/2015 Elsevier Interactive Patient Education  2017 [employer].        IF you received an x-ray today, you will receive an invoice from [facility]. Please contact [facility] at [phone] with questions or concerns regarding your invoice.   IF you received labwork today, you will receive an invoice from [facility]. Please contact [facility] at [phone] with questions or concerns regarding your invoice.   Our billing staff will not be able to assist you with questions regarding bills from these companies.  You will be contacted with the lab results as soon as they are available. The fastest way to get your results is to activate your My Chart account. Instructions are located on the last page of this paperwork. If you have not heard from [place] regarding the results in 2 weeks, please contact this office.

## 2016-08-05 NOTE — Progress Notes (Signed)
By signing my name below I, Tereasa Coop, attest that this documentation has been prepared under the direction and in the presence of [name], MD. Electonically Signed. Tereasa Coop, Scribe 08/05/2016 at 4:29 PM  Subjective:    Patient ID: Antonio Wells, male    DOB: Jul 29, 1945, 71 y.o.   MRN: [id number]  Chief Complaint  Patient presents with  . Follow-up    diabetes    HPI Antonio Wells is a 71 y.o. male who presents to the [facility] for follow up reevaluation of DM  DM with history of diabetic retinopathy Target A1C is less than 7. Is followed by eye doctor regularly for the retinopathy. Lab Results  Component Value Date   HGBA1C 7.6 (H) 04/22/2016  Added januvia 50mg  QD at last visit. Continued metformin 500mg  BID as creatinine was stable at 1.34. Intolerant due to higher dose of metformin due to GI side effects. Continued glucotrol 10mg  BID. Pt states he has not started [place] because it was too expensive.  HTN See last office visit. Followed by Dr Meda Coffee; cardiologist. Last office visit was 04/30/16. On Imdur 30mg  QD. Improved his DOE. Uses lasix as needed for lower extremity edema. 126/76 at that visit. Pt was continued on losartan, metoprolol, and amlodipine at same doses.   Pt states he had low back pain at sciatic notch that radiates down his rt leg. Pt thinks pain is due to the cold weather and has not been exercising as frequently as usual, states his back pain has improved and is planning to resume exercising.   Reports that he sees his dentist every 6 months.    Patient Active Problem List   Diagnosis Date Noted  . Hypertensive heart disease 07/14/2015  . Lower extremity edema 07/14/2015  . Aortic stenosis 01/09/2015  . OSA on CPAP 12/12/2014  . Hypersomnia with sleep apnea 12/12/2014  . Obstructive sleep apnea (adult) (pediatric) 02/04/2014  . 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 Obesity 02/04/2014  . Mitral  regurgitation and aortic stenosis 01/02/2014  . DOE (dyspnea on exertion) 11/26/2013  . Gout 08/12/2011  . Hypertension 08/12/2011  . High cholesterol 08/12/2011  . OSA (obstructive sleep apnea) 08/12/2011  . Diabetes type 2, uncontrolled ([place]) 08/12/2011   Past Medical History:  Diagnosis Date  . Diabetes mellitus without complication ([place])   . Gout   . Heart murmur   . High cholesterol   . Hypertension   . Sleep apnea    Past Surgical History:  Procedure Laterality Date  . COLONOSCOPY  2008   Allergies  Allergen Reactions  . Penicillins Other (See Comments)    Passed out  . Tramadol Other (See Comments)    Hiccups and vomiting  . Cortisone     Hiccups and vomiting  . Lisinopril Cough   Prior to Admission medications   Medication Sig Start Date End Date Taking? Authorizing Provider  amLODipine (NORVASC) 10 MG tablet TAKE 1 TABLET (10 MG TOTAL) BY MOUTH DAILY. 06/02/16   [name], MD  aspirin EC 81 MG tablet Take 1 tablet (81 mg total) by mouth daily. 05/31/13   Harvie Heck, PA-C  colchicine 0.6 MG tablet Take 1-2 tablets (0.6-1.2 mg total) by mouth daily as needed (for gout flare). 2 po at onset of flair and then repeat in 1 h for acute gout attack then 1 po qd until pain free 05/31/13   Harvie Heck, PA-C  furosemide (LASIX) 20 MG tablet Take 1  tablet (20 mg total) by mouth daily as needed for edema. 03/01/16   [name], MD  glipiZIDE (GLUCOTROL) 10 MG tablet TAKE 1 TABLET BY MOUTH TWICE A DAY BEFORE MEALS. 03/26/16   [name], MD  isosorbide mononitrate (IMDUR) 30 MG 24 hr tablet TAKE 1 TABLET (30 MG TOTAL) BY MOUTH DAILY. 07/28/16   [name], MD  losartan (COZAAR) 100 MG tablet TAKE 1 TABLET (100 MG TOTAL) BY MOUTH DAILY. 06/02/16   [name], MD  lovastatin (MEVACOR) 20 MG tablet TAKE 1 TABLET BY MOUTH EVERY DAY AT BEDTIME. 07/14/16   [name], MD  metFORMIN (GLUCOPHAGE) 500 MG tablet TAKE 1 TABLET (500 MG TOTAL) BY MOUTH 2  (TWO) TIMES DAILY WITH A MEAL. 05/20/16   [name], MD  metoprolol (LOPRESSOR) 50 MG tablet TAKE 1 TABLET (50 MG TOTAL) BY MOUTH 2 (TWO) TIMES DAILY. 07/14/16   [name], MD  sitaGLIPtin (JANUVIA) 50 MG tablet Take 1 tablet (50 mg total) by mouth daily. 05/04/16   [name], MD   Social History   Social History  . Marital status: Married    Spouse name: [name]  . Number of children: 3  . Years of education: 46   Occupational History  . Musician    Social History Main Topics  . Smoking status: Former Smoker    Packs/day: 2.00    Years: 15.00    Types: Cigarettes    Quit date: 07/26/1998  . Smokeless tobacco: Never Used  . Alcohol use 0.0 oz/week     Comment: occasionally  . Drug use: No  . Sexual activity: Yes   Other Topics Concern  . Not on file   Social History Narrative   Patient is married [name]) and lives at home with his wife.   Patient has three adult children.   Patient is retired.   Patient is a Research officer, political party.   Patient is right-handed.   Patient drinks one cup of coffee daily and 2-3 sodas per day.            Review of Systems  Constitutional: Negative for fatigue and unexpected weight change.  Eyes: Negative for visual disturbance.  Respiratory: Negative for cough, chest tightness and shortness of breath.   Cardiovascular: Negative for chest pain, palpitations and leg swelling.  Gastrointestinal: Negative for abdominal pain and blood in stool.  Musculoskeletal: Positive for back pain (mild, resolving spontaneously).  Neurological: Negative for dizziness, light-headedness and headaches.       Objective:   Physical Exam  Constitutional: He is oriented to person, place, and time. He appears well-developed and well-nourished.  HENT:  Head: Normocephalic and atraumatic.  Eyes: EOM are normal. Pupils are equal, round, and reactive to light.  Neck: No JVD present. Carotid bruit is not present.  Cardiovascular: Normal rate, regular  rhythm and normal heart sounds.   No murmur heard. Pulmonary/Chest: Effort normal and breath sounds normal. He has no rales.  Musculoskeletal: He exhibits no edema.       Lumbar back: He exhibits normal range of motion, no tenderness and no bony tenderness.  Pt has full ROM of the lumbar spine. Non tender.   Neurological: He is alert and oriented to person, place, and time.  Negative seated leg raise. Normal heel and toe walk.   Skin: Skin is warm and dry.  Psychiatric: He has a normal mood and affect.  Vitals reviewed.    Vitals:   08/05/16  1625  BP: (!) 156/70  Pulse: 65  Resp: 18  Temp: 98.7 F (37.1 C)  TempSrc: Oral  SpO2: 98%  Weight: 201 lb (91.2 kg)  Height: 5\' 3"  (1.6 m)    Wt Readings from Last 3 Encounters:  04/30/16 201 lb (91.2 kg)  04/22/16 204 lb 9.6 oz (92.8 kg)  01/08/16 199 lb 9.6 oz (90.5 kg)         Assessment & Plan:    Antonio Wells is a 71 y.o. male Uncontrolled type 2 diabetes mellitus with retinopathy, without long-term current use of insulin, macular edema presence unspecified, unspecified laterality, unspecified retinopathy severity ([place]) - Plan: metFORMIN (GLUCOPHAGE) 500 MG tablet, Hemoglobin A1C, glipiZIDE (GLUCOTROL) 10 MG tablet  -Did not start Januvia. Did not tolerate higher doses of metformin. Will check A1c, but likely will need additional agent as discussed last time, especially for improved control with history of kidney disease and retinopathy.  Essential hypertension  - Overall stable prior. Monitor outside of visit, a change in meds for now. Labs pending.  Chronic kidney disease, unspecified CKD stage - Plan: Basic metabolic panel  -Check creatinine, avoid nephrotoxins. If increasing creatinine, may need to discontinue metformin.  Sciatica, right side  -Suspected sciatica based on location and description. Improving now. Handout given, RTC precautions given.  Meds ordered this encounter  Medications  . terbinafine  (LAMISIL) 250 MG tablet    Sig: Take 250 mg by mouth daily.  . metFORMIN (GLUCOPHAGE) 500 MG tablet    Sig: TAKE 1 TABLET (500 MG TOTAL) BY MOUTH 2 (TWO) TIMES DAILY WITH A MEAL.    Dispense:  180 tablet    Refill:  1  . glipiZIDE (GLUCOTROL) 10 MG tablet    Sig: TAKE 1 TABLET BY MOUTH TWICE A DAY BEFORE MEALS.    Dispense:  180 tablet    Refill:  1   Patient Instructions    Please ask your pharmacist to look at other medications comparable to Januvia to see if there are less costly options.  We need to get your A1C  below 7, especially with retinopathy. I will check your A1c, but then can discuss options.   Your back pain was likely due to sciatica. See information below as well as exercises. If those cause any pain, stop right away and if symptoms return, return to discuss further and possible x-rays.  Monitor your blood pressure outside of office and if remaining over 140/90, return here or discuss with your cardiologist.  Recheck in 3 months.    Sciatica Sciatica is pain, numbness, weakness, or tingling along the path of the sciatic nerve. The sciatic nerve starts in the lower back and runs down the back of each leg. The nerve controls the muscles in the lower leg and in the back of the knee. It also provides feeling (sensation) to the back of the thigh, the lower leg, and the sole of the foot. Sciatica is a symptom of another medical condition that pinches or puts pressure on the sciatic nerve. Generally, sciatica only affects one side of the body. Sciatica usually goes away on its own or with treatment. In some cases, sciatica may keep coming back (recur). What are the causes? This condition is caused by pressure on the sciatic nerve, or pinching of the sciatic nerve. This may be the result of:  A disk in between the bones of the spine (vertebrae) bulging out too far (herniated disk).  Age-related changes in the spinal disks (degenerative  disk disease).  A pain disorder that  affects a muscle in the buttock (piriformis syndrome).  Extra bone growth (bone spur) near the sciatic nerve.  An injury or break (fracture) of the pelvis.  Pregnancy.  Tumor (rare). What increases the risk? The following factors may make you more likely to develop this condition:  Playing sports that place pressure or stress on the spine, such as football or weight lifting.  Having poor strength and flexibility.  A history of back injury.  A history of back surgery.  Sitting for long periods of time.  Doing activities that involve repetitive bending or lifting.  Obesity. What are the signs or symptoms? Symptoms can vary from mild to very severe, and they may include:  Any of these problems in the lower back, leg, hip, or buttock:  Mild tingling or dull aches.  Burning sensations.  Sharp pains.  Numbness in the back of the calf or the sole of the foot.  Leg weakness.  Severe back pain that makes movement difficult. These symptoms may get worse when you cough, sneeze, or laugh, or when you sit or stand for long periods of time. Being overweight may also make symptoms worse. In some cases, symptoms may recur over time. How is this diagnosed? This condition may be diagnosed based on:  Your symptoms.  A physical exam. Your health care provider may ask you to do certain movements to check whether those movements trigger your symptoms.  You may have tests, including:  Blood tests.  X-rays.  MRI.  CT scan. How is this treated? In many cases, this condition improves on its own, without any treatment. However, treatment may include:  Reducing or modifying physical activity during periods of pain.  Exercising and stretching to strengthen your abdomen and improve the flexibility of your spine.  Icing and applying heat to the affected area.  Medicines that help:  To relieve pain and swelling.  To relax your muscles.  Injections of medicines that help to  relieve pain, irritation, and inflammation around the sciatic nerve (steroids).  Surgery. Follow these instructions at home: Medicines  Take over-the-counter and prescription medicines only as told by your health care provider.  Do not drive or operate heavy machinery while taking prescription pain medicine. Managing pain  If directed, apply ice to the affected area.  Put ice in a plastic bag.  Place a towel between your skin and the bag.  Leave the ice on for 20 minutes, 2-3 times a day.  After icing, apply heat to the affected area before you exercise or as often as told by your health care provider. Use the heat source that your health care provider recommends, such as a moist heat pack or a heating pad.  Place a towel between your skin and the heat source.  Leave the heat on for 20-30 minutes.  Remove the heat if your skin turns bright red. This is especially important if you are unable to feel pain, heat, or cold. You may have a greater risk of getting burned. Activity  Return to your normal activities as told by your health care provider. Ask your health care provider what activities are safe for you.  Avoid activities that make your symptoms worse.  Take brief periods of rest throughout the day. Resting in a lying or standing position is usually better than sitting to rest.  When you rest for longer periods, mix in some mild activity or stretching between periods of rest. This will help  to prevent stiffness and pain.  Avoid sitting for long periods of time without moving. Get up and move around at least one time each hour.  Exercise and stretch regularly, as told by your health care provider.  Do not lift anything that is heavier than 10 lb (4.5 kg) while you have symptoms of sciatica. When you do not have symptoms, you should still avoid heavy lifting, especially repetitive heavy lifting.  When you lift objects, always use proper lifting technique, which  includes:  Bending your knees.  Keeping the load close to your body.  Avoiding twisting. General instructions  Use good posture.  Avoid leaning forward while sitting.  Avoid hunching over while standing.  Maintain a healthy weight. Excess weight puts extra stress on your back and makes it difficult to maintain good posture.  Wear supportive, comfortable shoes. Avoid wearing high heels.  Avoid sleeping on a mattress that is too soft or too hard. A mattress that is firm enough to support your back when you sleep may help to reduce your pain.  Keep all follow-up visits as told by your health care provider. This is important. Contact a health care provider if:  You have pain that wakes you up when you are sleeping.  You have pain that gets worse when you lie down.  Your pain is worse than you have experienced in the past.  Your pain lasts longer than 4 weeks.  You experience unexplained weight loss. Get help right away if:  You lose control of your bowel or bladder (incontinence).  You have:  Weakness in your lower back, pelvis, buttocks, or legs that gets worse.  Redness or swelling of your back.  A burning sensation when you urinate. This information is not intended to replace advice given to you by your health care provider. Make sure you discuss any questions you have with your health care provider. Document Released: 07/06/2001 Document Revised: 12/16/2015 Document Reviewed: 03/21/2015 Elsevier Interactive Patient Education  2017 [employer].  Sciatica Rehab Ask your health care provider which exercises are safe for you. Do exercises exactly as told by your health care provider and adjust them as directed. It is normal to feel mild stretching, pulling, tightness, or discomfort as you do these exercises, but you should stop right away if you feel sudden pain or your pain gets worse.Do not begin these exercises until told by your health care provider. Stretching  and range of motion exercises These exercises warm up your muscles and joints and improve the movement and flexibility of your hips and your back. These exercises also help to relieve pain, numbness, and tingling. Exercise A: Sciatic nerve glide 1. Sit in a chair with your head facing down toward your chest. Place your hands behind your back. Let your shoulders slump forward. 2. Slowly straighten one of your knees while you tilt your head back as if you are looking toward the ceiling. Only straighten your leg as far as you can without making your symptoms worse. 3. Hold for __________ seconds. 4. Slowly return to the starting position. 5. Repeat with your other leg. Repeat __________ times. Complete this exercise __________ times a day. Exercise B: Knee to chest with hip adduction and internal rotation 1. Lie on your back on a firm surface with both legs straight. 2. Bend one of your knees and move it up toward your chest until you feel a gentle stretch in your lower back and buttock. Then, move your knee toward the shoulder that  is on the opposite side from your leg.  Hold your leg in this position by holding onto the front of your knee. 3. Hold for __________ seconds. 4. Slowly return to the starting position. 5. Repeat with your other leg. Repeat __________ times. Complete this exercise __________ times a day. Exercise C: Prone extension on elbows 1. Lie on your abdomen on a firm surface. A bed may be too soft for this exercise. 2. Prop yourself up on your elbows. 3. Use your arms to help lift your chest up until you feel a gentle stretch in your abdomen and your lower back.  This will place some of your body weight on your elbows. If this is uncomfortable, try stacking pillows under your chest.  Your hips should stay down, against the surface that you are lying on. Keep your hip and back muscles relaxed. 4. Hold for __________ seconds. 5. Slowly relax your upper body and return to the  starting position. Repeat __________ times. Complete this exercise __________ times a day. Strengthening exercises These exercises build strength and endurance in your back. Endurance is the ability to use your muscles for a long time, even after they get tired. Exercise D: Pelvic tilt 1. Lie on your back on a firm surface. Bend your knees and keep your feet flat. 2. Tense your abdominal muscles. Tip your pelvis up toward the ceiling and flatten your lower back into the floor.  To help with this exercise, you may place a small towel under your lower back and try to push your back into the towel. 3. Hold for __________ seconds. 4. Let your muscles relax completely before you repeat this exercise. Repeat __________ times. Complete this exercise __________ times a day. Exercise E: Alternating arm and leg raises 1. Get on your hands and knees on a firm surface. If you are on a hard floor, you may want to use padding to cushion your knees, such as an exercise mat. 2. Line up your arms and legs. Your hands should be below your shoulders, and your knees should be below your hips. 3. Lift your left leg behind you. At the same time, raise your right arm and straighten it in front of you.  Do not lift your leg higher than your hip.  Do not lift your arm higher than your shoulder.  Keep your abdominal and back muscles tight.  Keep your hips facing the ground.  Do not arch your back.  Keep your balance carefully, and do not hold your breath. 4. Hold for __________ seconds. 5. Slowly return to the starting position and repeat with your right leg and your left arm. Repeat __________ times. Complete this exercise __________ times a day. Posture and body mechanics   Body mechanics refers to the movements and positions of your body while you do your daily activities. Posture is part of body mechanics. Good posture and healthy body mechanics can help to relieve stress in your body's tissues and  joints. Good posture means that your spine is in its natural S-curve position (your spine is neutral), your shoulders are pulled back slightly, and your head is not tipped forward. The following are general guidelines for applying improved posture and body mechanics to your everyday activities. Standing   When standing, keep your spine neutral and your feet about hip-width apart. Keep a slight bend in your knees. Your ears, shoulders, and hips should line up.  When you do a task in which you stand in one place for  a long time, place one foot up on a stable object that is 2-4 inches (5-10 cm) high, such as a footstool. This helps keep your spine neutral. Sitting  When sitting, keep your spine neutral and keep your feet flat on the floor. Use a footrest, if necessary, and keep your thighs parallel to the floor. Avoid rounding your shoulders, and avoid tilting your head forward.  When working at a desk or a computer, keep your desk at a height where your hands are slightly lower than your elbows. Slide your chair under your desk so you are close enough to maintain good posture.  When working at a computer, place your monitor at a height where you are looking straight ahead and you do not have to tilt your head forward or downward to look at the screen. Resting   When lying down and resting, avoid positions that are most painful for you.  If you have pain with activities such as sitting, bending, stooping, or squatting (flexion-based activities), lie in a position in which your body does not bend very much. For example, avoid curling up on your side with your arms and knees near your chest (fetal position).  If you have pain with activities such as standing for a long time or reaching with your arms (extension-based activities), lie with your spine in a neutral position and bend your knees slightly. Try the following positions:  Lying on your side with a pillow between your knees.  Lying on your  back with a pillow under your knees. Lifting   When lifting objects, keep your feet at least shoulder-width apart and tighten your abdominal muscles.  Bend your knees and hips and keep your spine neutral. It is important to lift using the strength of your legs, not your back. Do not lock your knees straight out.  Always ask for help to lift heavy or awkward objects. This information is not intended to replace advice given to you by your health care provider. Make sure you discuss any questions you have with your health care provider. Document Released: 07/12/2005 Document Revised: 03/18/2016 Document Reviewed: 03/28/2015 Elsevier Interactive Patient Education  2017 [employer].        IF you received an x-ray today, you will receive an invoice from [facility]. Please contact [facility] at [phone] with questions or concerns regarding your invoice.   IF you received labwork today, you will receive an invoice from [facility]. Please contact [facility] at [phone] with questions or concerns regarding your invoice.   Our billing staff will not be able to assist you with questions regarding bills from these companies.  You will be contacted with the lab results as soon as they are available. The fastest way to get your results is to activate your My Chart account. Instructions are located on the last page of this paperwork. If you have not heard from [place] regarding the results in 2 weeks, please contact this office.       I personally performed the services described in this documentation, which was scribed in my presence. The recorded information has been reviewed and considered, and addended by me as needed.   Signed,   [name], MD [facility].  08/06/16 4:34 PM

## 2016-08-06 ENCOUNTER — Encounter: Payer: Self-pay | Admitting: Family Medicine

## 2016-08-06 LAB — BASIC METABOLIC PANEL
BUN/Creatinine Ratio: 14 (ref 10–24)
BUN: 17 mg/dL (ref 8–27)
CALCIUM: 9.3 mg/dL (ref 8.6–10.2)
CHLORIDE: 105 mmol/L (ref 96–106)
CO2: 26 mmol/L (ref 18–29)
Creatinine, Ser: 1.2 mg/dL (ref 0.76–1.27)
GFR calc Af Amer: 71 mL/min/{1.73_m2} (ref >59)
GFR, EST NON AFRICAN AMERICAN: 61 mL/min/{1.73_m2} (ref >59)
GLUCOSE: 163 mg/dL — AB (ref 65–99)
POTASSIUM: 4.5 mmol/L (ref 3.5–5.2)
Sodium: 147 mmol/L — ABNORMAL HIGH (ref 134–144)

## 2016-08-06 LAB — HEMOGLOBIN A1C
Est. average glucose Bld gHb Est-mCnc: 154 mg/dL
HEMOGLOBIN A1C: 7 % — AB (ref 4.8–5.6)

## 2016-08-11 ENCOUNTER — Other Ambulatory Visit: Payer: Self-pay | Admitting: Cardiology

## 2016-09-11 ENCOUNTER — Other Ambulatory Visit: Payer: Self-pay | Admitting: Family Medicine

## 2016-09-11 DIAGNOSIS — E1165 Type 2 diabetes mellitus with hyperglycemia: Principal | ICD-10-CM

## 2016-09-11 DIAGNOSIS — IMO0001 Reserved for inherently not codable concepts without codable children: Secondary | ICD-10-CM

## 2016-09-23 ENCOUNTER — Other Ambulatory Visit: Payer: Self-pay | Admitting: Family Medicine

## 2016-09-23 NOTE — Telephone Encounter (Signed)
Meds ordered this encounter  Medications  . glipiZIDE (GLUCOTROL) 10 MG tablet    Sig: TAKE 1 TABLET BY MOUTH TWICE A DAY BEFORE MEALS.    Dispense:  180 tablet    Refill:  1   Visit with Dr. Carlota Raspberry 11/04/2016

## 2016-09-30 ENCOUNTER — Other Ambulatory Visit: Payer: Self-pay | Admitting: Family Medicine

## 2016-10-15 ENCOUNTER — Other Ambulatory Visit: Payer: Self-pay | Admitting: Family Medicine

## 2016-10-18 ENCOUNTER — Other Ambulatory Visit: Payer: Self-pay | Admitting: Family Medicine

## 2016-10-28 ENCOUNTER — Other Ambulatory Visit: Payer: Self-pay | Admitting: Family Medicine

## 2016-10-28 DIAGNOSIS — IMO0001 Reserved for inherently not codable concepts without codable children: Secondary | ICD-10-CM

## 2016-10-28 DIAGNOSIS — E1165 Type 2 diabetes mellitus with hyperglycemia: Principal | ICD-10-CM

## 2016-11-04 ENCOUNTER — Encounter: Payer: Self-pay | Admitting: Family Medicine

## 2016-11-04 ENCOUNTER — Ambulatory Visit (INDEPENDENT_AMBULATORY_CARE_PROVIDER_SITE_OTHER): Payer: Medicare Other | Admitting: Family Medicine

## 2016-11-04 VITALS — BP 150/81 | HR 65 | Temp 98.1°F | Resp 16 | Ht 63.0 in | Wt 205.0 lb

## 2016-11-04 DIAGNOSIS — E11319 Type 2 diabetes mellitus with unspecified diabetic retinopathy without macular edema: Secondary | ICD-10-CM | POA: Diagnosis not present

## 2016-11-04 DIAGNOSIS — R82998 Other abnormal findings in urine: Secondary | ICD-10-CM

## 2016-11-04 DIAGNOSIS — I10 Essential (primary) hypertension: Secondary | ICD-10-CM | POA: Diagnosis not present

## 2016-11-04 DIAGNOSIS — R8299 Other abnormal findings in urine: Secondary | ICD-10-CM | POA: Diagnosis not present

## 2016-11-04 DIAGNOSIS — E785 Hyperlipidemia, unspecified: Secondary | ICD-10-CM | POA: Diagnosis not present

## 2016-11-04 DIAGNOSIS — N189 Chronic kidney disease, unspecified: Secondary | ICD-10-CM

## 2016-11-04 MED ORDER — LOVASTATIN 20 MG PO TABS: 20.0000 mg | ORAL_TABLET | Freq: Every day | ORAL | 1 refills | Status: DC

## 2016-11-04 MED ORDER — HYDROCHLOROTHIAZIDE 12.5 MG PO TABS: 12.5000 mg | ORAL_TABLET | Freq: Every day | ORAL | 2 refills | Status: DC

## 2016-11-04 NOTE — Patient Instructions (Addendum)
Blood pressure has remained high, so will need to adjust medications. You can try the hydrochlorothiazide once per day, continue your other medications. If you do have a flare of gout, we may need to stop hydrochlorothiazide and look at other options. Follow up with your cardiologist, but if any increased shortness of breath or new chest pains, be seen there, here, or emergency room if needed.  Schedule ophthalmology follow-up. I will check kidney function and urine test for protein. Avoid sodas and continue exercise to work on weight loss. Recheck in 3 months.     IF you received an x-ray today, you will receive an invoice from [facility]. Please contact [facility] at [phone] with questions or concerns regarding your invoice.   IF you received labwork today, you will receive an invoice from [place]. Please contact [facility] at [phone] with questions or concerns regarding your invoice.   Our billing staff will not be able to assist you with questions regarding bills from these companies.  You will be contacted with the lab results as soon as they are available. The fastest way to get your results is to activate your My Chart account. Instructions are located on the last page of this paperwork. If you have not heard from [place] regarding the results in 2 weeks, please contact this office.

## 2016-11-04 NOTE — Progress Notes (Signed)
Subjective:  This chart was scribed for [name], MD by Tamsen Roers, at  [place] [facility].  This patient was seen in room 26 and the patient's care was started at 10:09 AM.    Patient ID: Antonio Wells, male    DOB: [birth year], 71 y.o.   MRN: [id number] Chief Complaint  Patient presents with  . Follow-up    3 mth f/u diabetes    HPI HPI Comments: Antonio Wells is a 71 y.o. male who presents to Primary Care at [facility] for a three month follow up regarding his diabetes. His last office visit was January 11th. At that time he had not started Januvia as planned due to cost.  He had been taking metformin 500 mg BID and Glucotrol 10 mg BID.  No new medications were added.  Patient sees the dentist every 6 months. He is also taking Statin and Aspirin. ----Patient exercises about 3-4 times every week (rides a bike 40-60 minutes). He states that when he urinates, he has foamy urine.  He denies any hematuria. Patient is not taking Januvia.  Diet: Patient drinks about 1-2 sodas per day. Vision- patient will schedule a visit with his eye doctor soon.   Lab Results  Component Value Date   HGBA1C 7.0 (H) 08/05/2016   Lab Results  Component Value Date   MICROALBUR 39.6 08/25/2015   Wt Readings from Last 3 Encounters:  11/04/16 205 lb (93 kg)  08/05/16 201 lb (91.2 kg)  04/30/16 201 lb (91.2 kg)    Shortness of breath/ occasional dizziness: Patient states that he occasionally has SOB and slight dizziness.  He is a [employer] fast streamer and states that when he was playing a couple weeks ago, he felt like he did not have enough of a breath to play with ease.  He will discuss this with his cardiologist.   He did have a negative nuclear stress test May 2015 and normal stress test January 2017.      Hypertension:  Slightly elevated last visit.  He is followed by Dr Meda Coffee with cardiology who has him on Imdur as well as Lasix and needed for edema. He was continued on losartan,  metoprolol and Norvasc at same doses.  ---- Patient is  occasionally checking his blood pressure but not as frequently as he used to.  He takes his Lasix occasionally- as needed.  He notes of swelling in his ankles about once or twice every month.  He is willing to try HCTZ and will keep track of his gout symptoms.     Immunizations:  He is due for Prevnar. -He deferred that as he felt sick for a few weeks after his last pneumonia vaccine. He would not like one today.     Chronic Kidney Disease: Creatinine had ranged from 1.2 to 1.52 most recently looked improved at 1.2.  Lab Results  Component Value Date   CREATININE 1.20 08/05/2016    Patient has a history of gout flare ups.     Patient Active Problem List   Diagnosis Date Noted  . Hypertensive heart disease 07/14/2015  . Lower extremity edema 07/14/2015  . Aortic stenosis 01/09/2015  . OSA on CPAP 12/12/2014  . Hypersomnia with sleep apnea 12/12/2014  . Obstructive sleep apnea (adult) (pediatric) 02/04/2014  . 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 Obesity 02/04/2014  . Mitral regurgitation and aortic stenosis 01/02/2014  . DOE (dyspnea on exertion) 11/26/2013  . Gout 08/12/2011  . Hypertension 08/12/2011  .  High cholesterol 08/12/2011  . OSA (obstructive sleep apnea) 08/12/2011  . Diabetes type 2, uncontrolled ([place]) 08/12/2011   Past Medical History:  Diagnosis Date  . Diabetes mellitus without complication ([place])   . Gout   . Heart murmur   . High cholesterol   . Hypertension   . Sleep apnea    Past Surgical History:  Procedure Laterality Date  . COLONOSCOPY  2008   Allergies  Allergen Reactions  . Penicillins Other (See Comments)    Passed out  . Tramadol Other (See Comments)    Hiccups and vomiting  . Cortisone     Hiccups and vomiting  . Lisinopril Cough   Prior to Admission medications   Medication Sig Start Date End Date Taking? Authorizing Provider  amLODipine (NORVASC) 10 MG tablet TAKE 1 TABLET  (10 MG TOTAL) BY MOUTH DAILY. 06/02/16  Yes [name], MD  aspirin EC 81 MG tablet Take 1 tablet (81 mg total) by mouth daily. 05/31/13  Yes Harvie Heck, PA-C  colchicine 0.6 MG tablet Take 1-2 tablets (0.6-1.2 mg total) by mouth daily as needed (for gout flare). 2 po at onset of flair and then repeat in 1 h for acute gout attack then 1 po qd until pain free 05/31/13  Yes Harvie Heck, PA-C  furosemide (LASIX) 20 MG tablet Take 1 tablet (20 mg total) by mouth daily as needed for edema. 03/01/16  Yes [name], MD  glipiZIDE (GLUCOTROL) 10 MG tablet TAKE 1 TABLET BY MOUTH TWICE A DAY BEFORE MEALS. 08/05/16  Yes [name], MD  glipiZIDE (GLUCOTROL) 10 MG tablet TAKE 1 TABLET BY MOUTH TWICE A DAY BEFORE MEALS. 09/23/16  Yes Chelle Jeffery, PA-C  glipiZIDE (GLUCOTROL) 10 MG tablet TAKE 1 TABLET BY MOUTH TWICE A DAY BEFORE MEALS. 09/30/16  Yes [name], MD  isosorbide mononitrate (IMDUR) 30 MG 24 hr tablet TAKE 1 TABLET (30 MG TOTAL) BY MOUTH DAILY. 07/28/16  Yes [name], MD  isosorbide mononitrate (IMDUR) 30 MG 24 hr tablet TAKE 1 TABLET (30 MG TOTAL) BY MOUTH DAILY. 08/13/16  Yes [name], MD  losartan (COZAAR) 100 MG tablet TAKE 1 TABLET (100 MG TOTAL) BY MOUTH DAILY. 06/02/16  Yes [name], MD  lovastatin (MEVACOR) 20 MG tablet TAKE 1 TABLET BY MOUTH EVERY DAY AT BEDTIME. 10/15/16  Yes [name], MD  lovastatin (MEVACOR) 20 MG tablet TAKE 1 TABLET BY MOUTH EVERY DAY AT BEDTIME. 10/20/16  Yes Mancel Bale, PA-C  metFORMIN (GLUCOPHAGE) 500 MG tablet TAKE 1 TABLET (500 MG TOTAL) BY MOUTH 2 (TWO) TIMES DAILY WITH A MEAL. 08/05/16  Yes [name], MD  metFORMIN (GLUCOPHAGE) 500 MG tablet TAKE 1 TABLET BY MOUTH TWICE A DAY WITH MEALS 09/13/16  Yes Elizabeth Whitney McVey, PA-C  metFORMIN (GLUCOPHAGE) 500 MG tablet TAKE 1 TABLET (500 MG TOTAL) BY MOUTH 2 (TWO) TIMES DAILY WITH A MEAL. 10/29/16  Yes [name], MD  metoprolol (LOPRESSOR) 50 MG  tablet TAKE 1 TABLET BY MOUTH TWICE A DAY 10/15/16  Yes [name], MD  metoprolol (LOPRESSOR) 50 MG tablet TAKE 1 TABLET BY MOUTH TWICE A DAY 10/20/16  Yes Mancel Bale, PA-C  sitaGLIPtin (JANUVIA) 50 MG tablet Take 1 tablet (50 mg total) by mouth daily. 05/04/16  Yes [name], MD  terbinafine (LAMISIL) 250 MG tablet Take 250 mg by mouth daily.   Yes Historical Provider, MD   Social History  Social History  . Marital status: Married    Spouse name: [name]  . Number of children: 3  . Years of education: 76   Occupational History  . Musician    Social History Main Topics  . Smoking status: Former Smoker    Packs/day: 2.00    Years: 15.00    Types: Cigarettes    Quit date: 07/26/1998  . Smokeless tobacco: Never Used  . Alcohol use 0.0 oz/week     Comment: occasionally  . Drug use: No  . Sexual activity: Yes   Other Topics Concern  . Not on file   Social History Narrative   Patient is married [name]) and lives at home with his wife.   Patient has three adult children.   Patient is retired.   Patient is a Research officer, political party.   Patient is right-handed.   Patient drinks one cup of coffee daily and 2-3 sodas per day.            Review of Systems  Constitutional: Negative for chills and fever.  Eyes: Negative for pain, redness and itching.  Cardiovascular: Negative for chest pain.  Gastrointestinal: Negative for nausea and vomiting.  Musculoskeletal: Negative for neck pain and neck stiffness.       Objective:   Physical Exam  Constitutional: He is oriented to person, place, and time. He appears well-developed and well-nourished.  HENT:  Head: Normocephalic and atraumatic.  Eyes: EOM are normal. Pupils are equal, round, and reactive to light.  Neck: No JVD present. Carotid bruit is not present.  Cardiovascular: Normal rate, regular rhythm and normal heart sounds.   No murmur heard. Pulmonary/Chest: Effort normal and breath sounds normal. He has no rales.    Musculoskeletal: He exhibits no edema.  Neurological: He is alert and oriented to person, place, and time.  Skin: Skin is warm and dry.  Psychiatric: He has a normal mood and affect.  Vitals reviewed.   Vitals:   11/04/16 0944 11/04/16 1042  BP: (!) 176/76 (!) 150/81  Pulse: 65   Resp: 16   Temp: 98.1 F (36.7 C)   TempSrc: Oral   SpO2: 98%   Weight: 205 lb (93 kg)   Height: 5\' 3"  (1.6 m)         Assessment & Plan:   Antonio Wells is a 71 y.o. male Type 2 diabetes mellitus with retinopathy, without long-term current use of insulin, macular edema presence unspecified, unspecified laterality, unspecified retinopathy severity ([place]) - Plan: HM Diabetes Foot Exam, Microalbumin, urine  - Improved on last reading. Continue follow-up with ophthalmologist. Continue same dose of metformin and Glucotrol for now with repeat A1c. Watch diet, exercise once cleared by cardiology with symptoms described above of dyspnea. ER/RTC precautions if acute worsening prior to evaluation with cardiology.  Essential hypertension  - Persistently elevated. With diabetes, would like to see him below 130/80 ideally. Will add hydrochlorothiazide, but if any flare of gout symptoms, may need to choose different agent. Potential risks and gout flare possibility discussed.  Chronic kidney disease, unspecified CKD stage  - Repeat CMP, previously stable/improved continue to avoid nephrotoxins, work on blood pressure and diabetes control as above.  Foamy urine - Plan: Hemoglobin A1c, Comprehensive metabolic panel  - Check urine microalbumin, CMP, creatinine. If persistent symptoms, or worsening renal function, consider nephrology evaluation.  Hyperlipidemia, unspecified hyperlipidemia type - Plan: Lipid panel  - Check lipid panel, continue lovastatin at same dose as tolerating currently,   Meds ordered this encounter  Medications  . lovastatin (MEVACOR) 20 MG tablet    Sig: Take 1 tablet (20 mg total) by  mouth at bedtime.    Dispense:  90 tablet    Refill:  1  . hydrochlorothiazide (HYDRODIURIL) 12.5 MG tablet    Sig: Take 1 tablet (12.5 mg total) by mouth daily.    Dispense:  30 tablet    Refill:  2   Patient Instructions   Blood pressure has remained high, so will need to adjust medications. You can try the hydrochlorothiazide once per day, continue your other medications. If you do have a flare of gout, we may need to stop hydrochlorothiazide and look at other options. Follow up with your cardiologist, but if any increased shortness of breath or new chest pains, be seen there, here, or emergency room if needed.  Schedule ophthalmology follow-up. I will check kidney function and urine test for protein. Avoid sodas and continue exercise to work on weight loss. Recheck in 3 months.     IF you received an x-ray today, you will receive an invoice from [facility]. Please contact [facility] at [phone] with questions or concerns regarding your invoice.   IF you received labwork today, you will receive an invoice from [place]. Please contact [facility] at [phone] with questions or concerns regarding your invoice.   Our billing staff will not be able to assist you with questions regarding bills from these companies.  You will be contacted with the lab results as soon as they are available. The fastest way to get your results is to activate your My Chart account. Instructions are located on the last page of this paperwork. If you have not heard from [place] regarding the results in 2 weeks, please contact this office.       I personally performed the services described in this documentation, which was scribed in my presence. The recorded information has been reviewed and considered for accuracy and completeness, addended by me as needed, and agree with information above.  Signed,   [name], MD Primary [facility] at [place].  11/05/16 9:30  AM

## 2016-11-06 LAB — LIPID PANEL
CHOLESTEROL TOTAL: 146 mg/dL (ref 100–199)
Chol/HDL Ratio: 3.1 ratio (ref 0.0–5.0)
HDL: 47 mg/dL (ref >39)
LDL Calculated: 69 mg/dL (ref 0–99)
Triglycerides: 148 mg/dL (ref 0–149)
VLDL CHOLESTEROL CAL: 30 mg/dL (ref 5–40)

## 2016-11-06 LAB — MICROALBUMIN, URINE

## 2016-11-06 LAB — COMPREHENSIVE METABOLIC PANEL
ALK PHOS: 57 IU/L (ref 39–117)
ALT: 19 IU/L (ref 0–44)
AST: 18 IU/L (ref 0–40)
Albumin/Globulin Ratio: 2.2 (ref 1.2–2.2)
Albumin: 4.6 g/dL (ref 3.6–4.8)
BUN / CREAT RATIO: 11 (ref 10–24)
BUN: 14 mg/dL (ref 8–27)
Bilirubin Total: 0.3 mg/dL (ref 0.0–1.2)
CO2: 26 mmol/L (ref 18–29)
CREATININE: 1.29 mg/dL — AB (ref 0.76–1.27)
Calcium: 9.4 mg/dL (ref 8.6–10.2)
Chloride: 104 mmol/L (ref 96–106)
GFR calc Af Amer: 65 mL/min/{1.73_m2} (ref >59)
GFR calc non Af Amer: 56 mL/min/{1.73_m2} — ABNORMAL LOW (ref >59)
GLUCOSE: 167 mg/dL — AB (ref 65–99)
Globulin, Total: 2.1 g/dL (ref 1.5–4.5)
Potassium: 4.7 mmol/L (ref 3.5–5.2)
Sodium: 146 mmol/L — ABNORMAL HIGH (ref 134–144)
Total Protein: 6.7 g/dL (ref 6.0–8.5)

## 2016-11-06 LAB — HEMOGLOBIN A1C
Est. average glucose Bld gHb Est-mCnc: 189 mg/dL
HEMOGLOBIN A1C: 8.2 % — AB (ref 4.8–5.6)

## 2016-11-11 ENCOUNTER — Encounter: Payer: Self-pay | Admitting: Family Medicine

## 2016-11-17 ENCOUNTER — Telehealth: Payer: Self-pay | Admitting: Family Medicine

## 2016-11-17 MED ORDER — METOPROLOL TARTRATE 50 MG PO TABS: 50.0000 mg | ORAL_TABLET | Freq: Two times a day (BID) | ORAL | 0 refills | Status: DC

## 2016-11-17 MED ORDER — AMLODIPINE BESYLATE 10 MG PO TABS: ORAL_TABLET | ORAL | 0 refills | Status: DC

## 2016-11-17 MED ORDER — LOVASTATIN 20 MG PO TABS: 20.0000 mg | ORAL_TABLET | Freq: Every day | ORAL | 0 refills | Status: DC

## 2016-11-17 MED ORDER — LOSARTAN POTASSIUM 100 MG PO TABS: ORAL_TABLET | ORAL | 0 refills | Status: DC

## 2016-11-17 MED ORDER — ISOSORBIDE MONONITRATE ER 30 MG PO TB24: 30.0000 mg | ORAL_TABLET | Freq: Every day | ORAL | 0 refills | Status: DC

## 2016-11-17 NOTE — Telephone Encounter (Signed)
Pt needs refills on isosorbide, amlodipine, losartan, lovastatin, metformin, metoprolol.  [facility] mail order - fax number is [phone]. Call him at [phone]

## 2016-12-24 ENCOUNTER — Encounter: Payer: Self-pay | Admitting: Urgent Care

## 2016-12-24 ENCOUNTER — Ambulatory Visit (INDEPENDENT_AMBULATORY_CARE_PROVIDER_SITE_OTHER): Payer: Medicare Other | Admitting: Urgent Care

## 2016-12-24 VITALS — BP 151/72 | HR 80 | Temp 99.0°F | Resp 17 | Ht 63.5 in | Wt 201.0 lb

## 2016-12-24 DIAGNOSIS — R030 Elevated blood-pressure reading, without diagnosis of hypertension: Secondary | ICD-10-CM

## 2016-12-24 DIAGNOSIS — J029 Acute pharyngitis, unspecified: Secondary | ICD-10-CM | POA: Diagnosis not present

## 2016-12-24 DIAGNOSIS — B9789 Other viral agents as the cause of diseases classified elsewhere: Secondary | ICD-10-CM | POA: Diagnosis not present

## 2016-12-24 DIAGNOSIS — J069 Acute upper respiratory infection, unspecified: Secondary | ICD-10-CM

## 2016-12-24 DIAGNOSIS — I10 Essential (primary) hypertension: Secondary | ICD-10-CM

## 2016-12-24 MED ORDER — BENZONATATE 100 MG PO CAPS: 100.0000 mg | ORAL_CAPSULE | Freq: Three times a day (TID) | ORAL | 0 refills | Status: DC | PRN

## 2016-12-24 MED ORDER — HYDROCODONE-HOMATROPINE 5-1.5 MG/5ML PO SYRP
10.0000 mL | ORAL_SOLUTION | Freq: Every evening | ORAL | 0 refills | Status: DC | PRN
Start: 2016-12-24 — End: 2017-02-03

## 2016-12-24 NOTE — Progress Notes (Signed)
  MRN: [id number] DOB: [birth year]  Subjective:   Antonio Wells is a 71 y.o. male presenting for chief complaint of Cough; URI; and Sore Throat  Reports 5 day history of sore throat, dry cough, runny nose, itchy eyes. Cough elicits wheezing, interrupts his sleep. Has tried Robitussin, Coricidin with minimal relief. Has a history of mild seasonal allergies, does not take anything for this consistently. Denies history of asthma. Denies fever, sinus pain, ear pain, ear drainage, chest pain, shob, n/v, abdominal pain, rashes. Denies smoking cigarettes. Patient is on HCTZ, losartan, amlodipine for his blood pressure. His diet is not compliant. He has f/u with his cardiologist in a couple of weeks. His blood pressure checks at home range from 937'D-428'J systolic.   Antonio Wells has a current medication list which includes the following prescription(s): amlodipine, aspirin ec, colchicine, furosemide, glipizide, glipizide, glipizide, hydrochlorothiazide, isosorbide mononitrate, isosorbide mononitrate, losartan, lovastatin, metformin, metoprolol tartrate, metoprolol tartrate, and terbinafine. Also is allergic to penicillins; tramadol; cortisone; and lisinopril. Antonio Wells  has a past medical history of Diabetes mellitus without complication ([place]); Gout; Heart murmur; High cholesterol; Hypertension; and Sleep apnea. Also  has a past surgical history that includes Colonoscopy (2008).  Objective:   Vitals: BP (!) 151/72   Pulse 80   Temp 99 F (37.2 C) (Oral)   Resp 17   Ht 5' 3.5" (1.613 m)   Wt 201 lb (91.2 kg)   SpO2 98%   BMI 35.05 kg/m   BP Readings from Last 3 Encounters:  12/24/16 (!) 151/72  11/04/16 (!) 150/81  08/05/16 (!) 156/70   Physical Exam  Constitutional: He is oriented to person, place, and time. He appears well-developed and well-nourished.  HENT:  TM's intact bilaterally, no effusions or erythema. Nasal turbinates pink and moist, nasal passages patent. No sinus  tenderness. Oropharynx with moderate post-nasal drainage in cobblestone pattern, mucous membranes moist.  Eyes: Right eye exhibits no discharge. Left eye exhibits no discharge.  Neck: Normal range of motion. Neck supple.  Cardiovascular: Normal rate, regular rhythm and intact distal pulses.  Exam reveals no gallop and no friction rub.   Murmur (systolic ejection (not new)) heard. Pulmonary/Chest: No respiratory distress. He has no wheezes. He has no rales.  Musculoskeletal: He exhibits no edema.  Lymphadenopathy:    He has no cervical adenopathy.  Neurological: He is alert and oriented to person, place, and time.  Skin: Skin is warm and dry.   Assessment and Plan :   1. Viral URI with cough 2. Sore throat - Likely viral in nature, advised supportive care. - If no improvement or symptoms do not resolve return to clinic in 4-6 days.  3. Essential hypertension 4. Elevated blood pressure reading - Patient is to check back with his PCP, cardiologist regarding his BP medications. Return-to-clinic precautions discussed, patient verbalized understanding.   Jaynee Eagles, PA-C Primary [facility] at [place] [phone] 12/24/2016  10:33 AM

## 2016-12-24 NOTE — Patient Instructions (Addendum)
You may take 500mg  Tylenol with ibuprofen 400 every 6 hours for throat pain, fever. Use Zyrtec to address any allergy component. Take 10mg  once daily for at least the next week.     Upper Respiratory Infection, Adult Most upper respiratory infections (URIs) are a viral infection of the air passages leading to the lungs. A URI affects the nose, throat, and upper air passages. The most common type of URI is nasopharyngitis and is typically referred to as "the common cold." URIs run their course and usually go away on their own. Most of the time, a URI does not require medical attention, but sometimes a bacterial infection in the upper airways can follow a viral infection. This is called a secondary infection. Sinus and middle ear infections are common types of secondary upper respiratory infections. Bacterial pneumonia can also complicate a URI. A URI can worsen asthma and chronic obstructive pulmonary disease (COPD). Sometimes, these complications can require emergency medical care and may be life threatening. What are the causes? Almost all URIs are caused by viruses. A virus is a type of germ and can spread from one person to another. What increases the risk? You may be at risk for a URI if:  You smoke.  You have chronic heart or lung disease.  You have a weakened defense (immune) system.  You are very young or very old.  You have nasal allergies or asthma.  You work in crowded or poorly ventilated areas.  You work in health care facilities or schools.  What are the signs or symptoms? Symptoms typically develop 2-3 days after you come in contact with a cold virus. Most viral URIs last 7-10 days. However, viral URIs from the influenza virus (flu virus) can last 14-18 days and are typically more severe. Symptoms may include:  Runny or stuffy (congested) nose.  Sneezing.  Cough.  Sore throat.  Headache.  Fatigue.  Fever.  Loss of appetite.  Pain in your forehead, behind  your eyes, and over your cheekbones (sinus pain).  Muscle aches.  How is this diagnosed? Your health care provider may diagnose a URI by:  Physical exam.  Tests to check that your symptoms are not due to another condition such as: ? Strep throat. ? Sinusitis. ? Pneumonia. ? Asthma.  How is this treated? A URI goes away on its own with time. It cannot be cured with medicines, but medicines may be prescribed or recommended to relieve symptoms. Medicines may help:  Reduce your fever.  Reduce your cough.  Relieve nasal congestion.  Follow these instructions at home:  Take medicines only as directed by your health care provider.  Gargle warm saltwater or take cough drops to comfort your throat as directed by your health care provider.  Use a warm mist humidifier or inhale steam from a shower to increase air moisture. This may make it easier to breathe.  Drink enough fluid to keep your urine clear or pale yellow.  Eat soups and other clear broths and maintain good nutrition.  Rest as needed.  Return to work when your temperature has returned to normal or as your health care provider advises. You may need to stay home longer to avoid infecting others. You can also use a face mask and careful hand washing to prevent spread of the virus.  Increase the usage of your inhaler if you have asthma.  Do not use any tobacco products, including cigarettes, chewing tobacco, or electronic cigarettes. If you need help quitting, ask your  health care provider. How is this prevented? The best way to protect yourself from getting a cold is to practice good hygiene.  Avoid oral or hand contact with people with cold symptoms.  Wash your hands often if contact occurs.  There is no clear evidence that vitamin C, vitamin E, echinacea, or exercise reduces the chance of developing a cold. However, it is always recommended to get plenty of rest, exercise, and practice good nutrition. Contact a  health care provider if:  You are getting worse rather than better.  Your symptoms are not controlled by medicine.  You have chills.  You have worsening shortness of breath.  You have brown or red mucus.  You have yellow or brown nasal discharge.  You have pain in your face, especially when you bend forward.  You have a fever.  You have swollen neck glands.  You have pain while swallowing.  You have white areas in the back of your throat. Get help right away if:  You have severe or persistent: ? Headache. ? Ear pain. ? Sinus pain. ? Chest pain.  You have chronic lung disease and any of the following: ? Wheezing. ? Prolonged cough. ? Coughing up blood. ? A change in your usual mucus.  You have a stiff neck.  You have changes in your: ? Vision. ? Hearing. ? Thinking. ? Mood. This information is not intended to replace advice given to you by your health care provider. Make sure you discuss any questions you have with your health care provider. Document Released: 01/05/2001 Document Revised: 03/14/2016 Document Reviewed: 10/17/2013 Elsevier Interactive Patient Education  2017 [employer].     IF you received an x-ray today, you will receive an invoice from [facility]. Please contact [facility] at [phone] with questions or concerns regarding your invoice.   IF you received labwork today, you will receive an invoice from [place]. Please contact [facility] at [phone] with questions or concerns regarding your invoice.   Our billing staff will not be able to assist you with questions regarding bills from these companies.  You will be contacted with the lab results as soon as they are available. The fastest way to get your results is to activate your My Chart account. Instructions are located on the last page of this paperwork. If you have not heard from [place] regarding the results in 2 weeks, please contact this office.

## 2016-12-28 ENCOUNTER — Encounter: Payer: Self-pay | Admitting: Family Medicine

## 2016-12-28 ENCOUNTER — Ambulatory Visit (INDEPENDENT_AMBULATORY_CARE_PROVIDER_SITE_OTHER): Payer: Medicare Other | Admitting: Family Medicine

## 2016-12-28 VITALS — BP 152/77 | HR 65 | Temp 98.3°F | Resp 16 | Ht 63.5 in | Wt 200.4 lb

## 2016-12-28 DIAGNOSIS — J22 Unspecified acute lower respiratory infection: Secondary | ICD-10-CM | POA: Diagnosis not present

## 2016-12-28 DIAGNOSIS — R05 Cough: Secondary | ICD-10-CM

## 2016-12-28 DIAGNOSIS — R059 Cough, unspecified: Secondary | ICD-10-CM

## 2016-12-28 DIAGNOSIS — J069 Acute upper respiratory infection, unspecified: Secondary | ICD-10-CM

## 2016-12-28 MED ORDER — AZITHROMYCIN 250 MG PO TABS: ORAL_TABLET | ORAL | 0 refills | Status: DC

## 2016-12-28 MED ORDER — HYDROCOD POLST-CPM POLST ER 10-8 MG/5ML PO SUER: 5.0000 mL | Freq: Every evening | ORAL | 0 refills | Status: DC | PRN

## 2016-12-28 NOTE — Progress Notes (Signed)
Subjective:  By signing my name below, I, [name], attest that this documentation has been prepared under the direction and in the presence of [name], MD Electronically Signed: Ladene Artist, ED Scribe 12/28/2016 at 12:03 PM.   Patient ID: Antonio Wells, male    DOB: [birth year], 71 y.o.   MRN: [id number]  Chief Complaint  Patient presents with  . URI    cough, ST, vomited yesterday    HPI Antonio Wells is a 71 y.o. male who presents to Primary Care at [facility] complaining of persistent cough x 10 days. Pt was seen in the office on 6/1 by Jaynee Eagles, PA-C for 5-6 days of cough and wheezing at night. He was started on Hycodan and Tessalon tid which he states has not provided any relief. Pt was also advised to start Zyrtec but states he forgot and has not started the medication. Since his last visit, he states that he has still been experiencing a cough, wheezing at night and a sore throat. Pt has also tried Tylenol and ibuprofen without significant relief. He denies fever, sob, myalgias, congestion, sinus pain/pressure. States he had similar cough in Juy 2016 that resolved with Tussionex.   Patient Active Problem List   Diagnosis Date Noted  . Hypertensive heart disease 07/14/2015  . Lower extremity edema 07/14/2015  . Aortic stenosis 01/09/2015  . OSA on CPAP 12/12/2014  . Hypersomnia with sleep apnea 12/12/2014  . Obstructive sleep apnea (adult) (pediatric) 02/04/2014  . 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 Obesity 02/04/2014  . Mitral regurgitation and aortic stenosis 01/02/2014  . DOE (dyspnea on exertion) 11/26/2013  . Gout 08/12/2011  . Hypertension 08/12/2011  . High cholesterol 08/12/2011  . OSA (obstructive sleep apnea) 08/12/2011  . Diabetes type 2, uncontrolled ([place]) 08/12/2011   Past Medical History:  Diagnosis Date  . Diabetes mellitus without complication ([place])   . Gout   . Heart murmur   . High cholesterol   . Hypertension   . Sleep apnea      Past Surgical History:  Procedure Laterality Date  . COLONOSCOPY  2008   Allergies  Allergen Reactions  . Penicillins Other (See Comments)    Passed out  . Tramadol Other (See Comments)    Hiccups and vomiting  . Cortisone     Hiccups and vomiting  . Lisinopril Cough   Prior to Admission medications   Medication Sig Start Date End Date Taking? Authorizing Provider  amLODipine (NORVASC) 10 MG tablet TAKE 1 TABLET (10 MG TOTAL) BY MOUTH DAILY. 11/17/16   [name], MD  aspirin EC 81 MG tablet Take 1 tablet (81 mg total) by mouth daily. 05/31/13   Harvie Heck, PA-C  benzonatate (TESSALON) 100 MG capsule Take 1-2 capsules (100-200 mg total) by mouth 3 (three) times daily as needed. 12/24/16   Jaynee Eagles, PA-C  colchicine 0.6 MG tablet Take 1-2 tablets (0.6-1.2 mg total) by mouth daily as needed (for gout flare). 2 po at onset of flair and then repeat in 1 h for acute gout attack then 1 po qd until pain free 05/31/13   Harvie Heck, PA-C  furosemide (LASIX) 20 MG tablet Take 1 tablet (20 mg total) by mouth daily as needed for edema. 03/01/16   [name], MD  glipiZIDE (GLUCOTROL) 10 MG tablet TAKE 1 TABLET BY MOUTH TWICE A DAY BEFORE MEALS. 08/05/16   [name], MD  glipiZIDE (GLUCOTROL) 10 MG tablet TAKE 1 TABLET BY MOUTH  TWICE A DAY BEFORE MEALS. 09/23/16   Jeffery, Chelle, PA-C  glipiZIDE (GLUCOTROL) 10 MG tablet TAKE 1 TABLET BY MOUTH TWICE A DAY BEFORE MEALS. 09/30/16   [name], MD  hydrochlorothiazide (HYDRODIURIL) 12.5 MG tablet Take 1 tablet (12.5 mg total) by mouth daily. 11/04/16   [name], MD  HYDROcodone-homatropine [facility]) 5-1.5 MG/5ML syrup Take 10 mLs by mouth at bedtime as needed. 12/24/16   Jaynee Eagles, PA-C  isosorbide mononitrate (IMDUR) 30 MG 24 hr tablet TAKE 1 TABLET (30 MG TOTAL) BY MOUTH DAILY. 07/28/16   [name], MD  isosorbide mononitrate (IMDUR) 30 MG 24 hr tablet Take 1 tablet (30 mg total) by mouth daily. 11/17/16    [name], MD  losartan (COZAAR) 100 MG tablet TAKE 1 TABLET (100 MG TOTAL) BY MOUTH DAILY. 11/17/16   [name], MD  lovastatin (MEVACOR) 20 MG tablet Take 1 tablet (20 mg total) by mouth at bedtime. 11/17/16   [name], MD  metFORMIN (GLUCOPHAGE) 500 MG tablet TAKE 1 TABLET (500 MG TOTAL) BY MOUTH 2 (TWO) TIMES DAILY WITH A MEAL. 10/29/16   [name], MD  metoprolol (LOPRESSOR) 50 MG tablet TAKE 1 TABLET BY MOUTH TWICE A DAY 10/20/16   Weber, Damaris Hippo, PA-C  metoprolol (LOPRESSOR) 50 MG tablet Take 1 tablet (50 mg total) by mouth 2 (two) times daily. 11/17/16   [name], MD  terbinafine (LAMISIL) 250 MG tablet Take 250 mg by mouth daily.    [provider]   Social History   Social History  . Marital status: Married    Spouse name: [name]  . Number of children: 3  . Years of education: 56   Occupational History  . Musician    Social History Main Topics  . Smoking status: Former Smoker    Packs/day: 2.00    Years: 15.00    Types: Cigarettes    Quit date: 07/26/1998  . Smokeless tobacco: Never Used  . Alcohol use 0.0 oz/week     Comment: occasionally  . Drug use: No  . Sexual activity: Yes   Other Topics Concern  . Not on file   Social History Narrative   Patient is married [name]) and lives at home with his wife.   Patient has three adult children.   Patient is retired.   Patient is a Research officer, political party.   Patient is right-handed.   Patient drinks one cup of coffee daily and 2-3 sodas per day.         Review of Systems  Constitutional: Negative for fever.  HENT: Positive for sore throat. Negative for congestion, sinus pain and sinus pressure.   Respiratory: Positive for cough and wheezing. Negative for shortness of breath.   Musculoskeletal: Negative for myalgias.      Objective:   Physical Exam  Constitutional: He is oriented to person, place, and time. He appears well-developed and well-nourished.  HENT:  Head: Normocephalic  and atraumatic.  Right Ear: Tympanic membrane, external ear and ear canal normal.  Left Ear: Tympanic membrane, external ear and ear canal normal.  Nose: No rhinorrhea. Right sinus exhibits no maxillary sinus tenderness and no frontal sinus tenderness. Left sinus exhibits no maxillary sinus tenderness and no frontal sinus tenderness.  Mouth/Throat: Oropharynx is clear and moist and mucous membranes are normal. No oropharyngeal exudate or posterior oropharyngeal erythema.  Moderate cerumen on R but visualized TM is normal.   Eyes: Conjunctivae are normal. Pupils are  equal, round, and reactive to light.  Neck: Neck supple.  Cardiovascular: Normal rate, regular rhythm, normal heart sounds and intact distal pulses.   No murmur heard. Pulmonary/Chest: Effort normal and breath sounds normal. He has no wheezes. He has no rhonchi. He has no rales.  Abdominal: Soft. There is no tenderness.  Lymphadenopathy:    He has no cervical adenopathy.  Neurological: He is alert and oriented to person, place, and time.  Skin: Skin is warm and dry. No rash noted.  Psychiatric: He has a normal mood and affect. His behavior is normal.  Vitals reviewed.  Vitals:   12/28/16 1151  BP: (!) 152/77  Pulse: 65  Resp: 16  Temp: 98.3 F (36.8 C)  TempSrc: Oral  SpO2: 96%  Weight: 200 lb 6.4 oz (90.9 kg)  Height: 5' 3.5" (1.613 m)      Assessment & Plan:   Antonio Wells is a 71 y.o. male Cough - Plan: chlorpheniramine-HYDROcodone (TUSSIONEX PENNKINETIC ER) 10-8 MG/5ML SUER, azithromycin (ZITHROMAX) 250 MG tablet  Acute upper respiratory infection  LRTI (lower respiratory tract infection) - Plan: azithromycin (ZITHROMAX) 250 MG tablet  Persistent cough, still appears to be likely viral versus early community-acquired/atypical.  -Change Hycodan to Tussionex as he had better results in the past. Side effects discussed  -Mucinex or Tessalon [place] during the day.  -If not improving into next week,  azithromycin prescription given. Potential side effects and risks of Vigamox discussed. RTC precautions discussed. Deferred x-ray at present as clear exam, afebrile, and reassuring O2sat  Meds ordered this encounter  Medications  . chlorpheniramine-HYDROcodone (TUSSIONEX PENNKINETIC ER) 10-8 MG/5ML SUER    Sig: Take 5 mLs by mouth at bedtime as needed for cough.    Dispense:  140 mL    Refill:  0  . azithromycin (ZITHROMAX) 250 MG tablet    Sig: Take 2 pills by mouth on day 1, then 1 pill by mouth per day on days 2 through 5.    Dispense:  6 tablet    Refill:  0   Patient Instructions    Stop hydrocodone cough syrup that was prescribed last week, change to Tussionex suspension at bedtime if needed. Mucinex or Mucinex DM over-the-counter during the day, Tessalon if needed during the day. Make sure you're drinking plenty of fluids, cough drops such as Cepacol if needed for sore throat or irritation of the throat that may be triggering your cough.  If you are wheezing or short of breath, do not use hydrocodone cough syrup and be seen for those symptoms.  If cough is not improving in next 1 week, can start antibiotic, but if you have fevers, short of breath or any worsening symptoms, return for recheck and possible x-ray.   IF you received an x-ray today, you will receive an invoice from [facility]. Please contact [facility] at [phone] with questions or concerns regarding your invoice.   IF you received labwork today, you will receive an invoice from [place]. Please contact [facility] at [phone] with questions or concerns regarding your invoice.   Our billing staff will not be able to assist you with questions regarding bills from these companies.  You will be contacted with the lab results as soon as they are available. The fastest way to get your results is to activate your My Chart account. Instructions are located on the last page of this paperwork. If you  have not heard from [place] regarding the results in 2 weeks, please contact this  office.       I personally performed the services described in this documentation, which was scribed in my presence. The recorded information has been reviewed and considered for accuracy and completeness, addended by me as needed, and agree with information above.  Signed,   [name], MD [facility] at [place].  12/29/16 12:52 PM

## 2016-12-28 NOTE — Patient Instructions (Addendum)
  Stop hydrocodone cough syrup that was prescribed last week, change to Tussionex suspension at bedtime if needed. Mucinex or Mucinex DM over-the-counter during the day, Tessalon if needed during the day. Make sure you're drinking plenty of fluids, cough drops such as Cepacol if needed for sore throat or irritation of the throat that may be triggering your cough.  If you are wheezing or short of breath, do not use hydrocodone cough syrup and be seen for those symptoms.  If cough is not improving in next 1 week, can start antibiotic, but if you have fevers, short of breath or any worsening symptoms, return for recheck and possible x-ray.   IF you received an x-ray today, you will receive an invoice from [facility]. Please contact [facility] at [phone] with questions or concerns regarding your invoice.   IF you received labwork today, you will receive an invoice from [place]. Please contact [facility] at [phone] with questions or concerns regarding your invoice.   Our billing staff will not be able to assist you with questions regarding bills from these companies.  You will be contacted with the lab results as soon as they are available. The fastest way to get your results is to activate your My Chart account. Instructions are located on the last page of this paperwork. If you have not heard from [place] regarding the results in 2 weeks, please contact this office.

## 2017-01-05 ENCOUNTER — Ambulatory Visit: Payer: Medicare Other | Admitting: Cardiology

## 2017-01-12 ENCOUNTER — Encounter: Payer: Self-pay | Admitting: Cardiology

## 2017-02-03 ENCOUNTER — Ambulatory Visit (INDEPENDENT_AMBULATORY_CARE_PROVIDER_SITE_OTHER): Payer: Medicare Other | Admitting: Family Medicine

## 2017-02-03 ENCOUNTER — Ambulatory Visit (INDEPENDENT_AMBULATORY_CARE_PROVIDER_SITE_OTHER): Payer: Medicare Other

## 2017-02-03 ENCOUNTER — Encounter: Payer: Self-pay | Admitting: Family Medicine

## 2017-02-03 VITALS — BP 127/73 | HR 74 | Temp 98.1°F | Resp 16 | Ht 63.5 in | Wt 202.2 lb

## 2017-02-03 DIAGNOSIS — E119 Type 2 diabetes mellitus without complications: Secondary | ICD-10-CM

## 2017-02-03 DIAGNOSIS — R05 Cough: Secondary | ICD-10-CM

## 2017-02-03 DIAGNOSIS — R059 Cough, unspecified: Secondary | ICD-10-CM

## 2017-02-03 DIAGNOSIS — I10 Essential (primary) hypertension: Secondary | ICD-10-CM

## 2017-02-03 NOTE — Progress Notes (Signed)
Subjective:  This chart was scribed for [name], MD by Tamsen Roers, at [place] at [facility].  This patient was seen in room 27 and the patient's care was started at 9:40 AM.   Chief Complaint  Patient presents with  . Follow-up    TIIDM      Patient ID: [name], male    DOB: [birth year], 71 y.o.   MRN: [id number]  HPI HPI Comments: Antonio Wells is a 71 y.o. male who presents to Primary Care at [facility] for a diabetes follow up.  He has chronic kidney disease.  He was continued on metformin and glucotrol but with elevated A1C,  discussed starting Januvia. Currently he is just taking glucotrol and metformin. Patient is on a statin and was intolerant to ACE inhibitor due to cough.  --- Patient is complaint with taking Glipizide 10 mg twice per day day and Metformin 500 mg twice per day.  He does not check his blood sugar levels regularly.  Patient was seen for a cough/cold seen June 1st and most recently June 5th.  At that time he was given Hycodan and tessalon pearls which he was complaint with. He was also compliant with the azithromycin which was prescribed to him at that time. He now still has a cough which requires him to clear his throat and has some discomfort in his chest with shortness of breath occasionally. He feels that his current cough seems to be getting worse as it is more persistent. Patient states that he is unable to lie flat on his bed and has to have his head elevated as he has some draining.  Denies coughing up any blood or fever/chills.  He denies congestion, rhinorrhea or heart burn.  Patient denies suffering from heart burn in the past.    Lab Results  Component Value Date   CREATININE 1.29 (H) 11/04/2016   Lab Results  Component Value Date   HGBA1C 8.2 (H) 11/04/2016   Lab Results  Component Value Date   MICROALBUR 39.6 08/25/2015   Wt Readings from Last 3 Encounters:  02/03/17 202 lb 3.2 oz (91.7 kg)  12/28/16 200 lb 6.4 oz  (90.9 kg)  12/24/16 201 lb (91.2 kg)   Optho : Patient has an appointment coming up with his eye doctor.    Health maintenance: He is due for Prevnar.  We dicussed exercise last visit once he is cleared by cardiology. ----He has been exercising about 5 days per week but states that he is not "always successful".  Prevnar was recommended but refused.     Patient Active Problem List   Diagnosis Date Noted  . Hypertensive heart disease 07/14/2015  . Lower extremity edema 07/14/2015  . Aortic stenosis 01/09/2015  . OSA on CPAP 12/12/2014  . Hypersomnia with sleep apnea 12/12/2014  . Obstructive sleep apnea (adult) (pediatric) 02/04/2014  . 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 Obesity 02/04/2014  . Mitral regurgitation and aortic stenosis 01/02/2014  . DOE (dyspnea on exertion) 11/26/2013  . Gout 08/12/2011  . Hypertension 08/12/2011  . High cholesterol 08/12/2011  . OSA (obstructive sleep apnea) 08/12/2011  . Diabetes type 2, uncontrolled ([place]) 08/12/2011   Past Medical History:  Diagnosis Date  . Diabetes mellitus without complication ([place])   . Gout   . Heart murmur   . High cholesterol   . Hypertension   . Sleep apnea    Past Surgical History:  Procedure Laterality Date  . COLONOSCOPY  2008  Allergies  Allergen Reactions  . Penicillins Other (See Comments)    Passed out  . Tramadol Other (See Comments)    Hiccups and vomiting  . Cortisone     Hiccups and vomiting  . Lisinopril Cough   Prior to Admission medications   Medication Sig Start Date End Date Taking? Authorizing Provider  amLODipine (NORVASC) 10 MG tablet TAKE 1 TABLET (10 MG TOTAL) BY MOUTH DAILY. 11/17/16   [name], MD  aspirin EC 81 MG tablet Take 1 tablet (81 mg total) by mouth daily. 05/31/13   Harvie Heck, PA-C  azithromycin (ZITHROMAX) 250 MG tablet Take 2 pills by mouth on day 1, then 1 pill by mouth per day on days 2 through 5. 12/28/16   [name], MD  benzonatate (TESSALON)  100 MG capsule Take 1-2 capsules (100-200 mg total) by mouth 3 (three) times daily as needed. 12/24/16   Jaynee Eagles, PA-C  chlorpheniramine-HYDROcodone (TUSSIONEX PENNKINETIC ER) 10-8 MG/5ML SUER Take 5 mLs by mouth at bedtime as needed for cough. 12/28/16   [name], MD  colchicine 0.6 MG tablet Take 1-2 tablets (0.6-1.2 mg total) by mouth daily as needed (for gout flare). 2 po at onset of flair and then repeat in 1 h for acute gout attack then 1 po qd until pain free 05/31/13   Harvie Heck, PA-C  furosemide (LASIX) 20 MG tablet Take 1 tablet (20 mg total) by mouth daily as needed for edema. 03/01/16   [name], MD  glipiZIDE (GLUCOTROL) 10 MG tablet TAKE 1 TABLET BY MOUTH TWICE A DAY BEFORE MEALS. 08/05/16   [name], MD  glipiZIDE (GLUCOTROL) 10 MG tablet TAKE 1 TABLET BY MOUTH TWICE A DAY BEFORE MEALS. 09/23/16   Jeffery, Chelle, PA-C  glipiZIDE (GLUCOTROL) 10 MG tablet TAKE 1 TABLET BY MOUTH TWICE A DAY BEFORE MEALS. 09/30/16   [name], MD  hydrochlorothiazide (HYDRODIURIL) 12.5 MG tablet Take 1 tablet (12.5 mg total) by mouth daily. 11/04/16   [name], MD  HYDROcodone-homatropine [facility]) 5-1.5 MG/5ML syrup Take 10 mLs by mouth at bedtime as needed. 12/24/16   Jaynee Eagles, PA-C  isosorbide mononitrate (IMDUR) 30 MG 24 hr tablet TAKE 1 TABLET (30 MG TOTAL) BY MOUTH DAILY. 07/28/16   [name], MD  isosorbide mononitrate (IMDUR) 30 MG 24 hr tablet Take 1 tablet (30 mg total) by mouth daily. 11/17/16   [name], MD  losartan (COZAAR) 100 MG tablet TAKE 1 TABLET (100 MG TOTAL) BY MOUTH DAILY. 11/17/16   [name], MD  lovastatin (MEVACOR) 20 MG tablet Take 1 tablet (20 mg total) by mouth at bedtime. 11/17/16   [name], MD  metFORMIN (GLUCOPHAGE) 500 MG tablet TAKE 1 TABLET (500 MG TOTAL) BY MOUTH 2 (TWO) TIMES DAILY WITH A MEAL. 10/29/16   [name], MD  metoprolol (LOPRESSOR) 50 MG tablet TAKE 1 TABLET BY MOUTH TWICE A DAY  10/20/16   Weber, Damaris Hippo, PA-C  metoprolol (LOPRESSOR) 50 MG tablet Take 1 tablet (50 mg total) by mouth 2 (two) times daily. 11/17/16   [name], MD  terbinafine (LAMISIL) 250 MG tablet Take 250 mg by mouth daily.    [provider]   Social History   Social History  . Marital status: Married    Spouse name: [name]  . Number of children: 3  . Years of education: 32   Occupational History  . Musician  Social History Main Topics  . Smoking status: Former Smoker    Packs/day: 2.00    Years: 15.00    Types: Cigarettes    Quit date: 07/26/1998  . Smokeless tobacco: Never Used  . Alcohol use 0.0 oz/week     Comment: occasionally  . Drug use: No  . Sexual activity: Yes   Other Topics Concern  . Not on file   Social History Narrative   Patient is married [name]) and lives at home with his wife.   Patient has three adult children.   Patient is retired.   Patient is a Research officer, political party.   Patient is right-handed.   Patient drinks one cup of coffee daily and 2-3 sodas per day.            Review of Systems  Constitutional: Negative for chills and fever.  HENT: Negative for congestion, rhinorrhea and sore throat.   Eyes: Negative for pain and redness.  Respiratory: Positive for cough.   Gastrointestinal: Negative for nausea and vomiting.  Neurological: Negative for speech difficulty.       Objective:   Physical Exam  Constitutional: He is oriented to person, place, and time. He appears well-developed and well-nourished.  HENT:  Head: Normocephalic and atraumatic.  Moist oral mucosa no post nasal discharge.   Eyes: Pupils are equal, round, and reactive to light. EOM are normal.  Neck: No JVD present. Carotid bruit is not present.  Cardiovascular: Normal rate, regular rhythm and normal heart sounds.   No murmur heard. Pulmonary/Chest: Effort normal and breath sounds normal. He has no rales.  Musculoskeletal: He exhibits no edema.  Neurological: He is  alert and oriented to person, place, and time.  Skin: Skin is warm and dry.  Psychiatric: He has a normal mood and affect.  Vitals reviewed.  Vitals:   02/03/17 0930  BP: 127/73  Pulse: 74  Resp: 16  Temp: 98.1 F (36.7 C)  TempSrc: Oral  SpO2: 97%  Weight: 202 lb 3.2 oz (91.7 kg)  Height: 5' 3.5" (1.613 m)   Dg Chest 2 View  Result Date: 02/03/2017 CLINICAL DATA:  Chronic cough. EXAM: CHEST  2 VIEW COMPARISON:  PA and lateral chest 05/24/2014 and 07/02/2013. FINDINGS: The lungs are clear. Heart size is normal. No pneumothorax or pleural effusion. No bony abnormality. IMPRESSION: Negative chest. Electronically Signed   By: Inge Rise M.D.   On: 02/03/2017 10:40        Assessment & Plan:   Antonio Wells is a 71 y.o. male Type 2 diabetes mellitus without complication, without long-term current use of insulin ([place]) - Plan: Hemoglobin N8M, Basic metabolic panel  - Check V6H, but suspect it will still be elevated. Options for additional medication were discussed including SGLT2 inhibitor, DPP-4 inhibitor, or GLP-1 agonist, with sample names to discuss with pharmacist which would be most cost effective. Potential side effects with each class were discussed.  Cough - Plan: DG Chest 2 View  -Chest x-ray without acute findings. May be related to allergies, reflux, or both with upper airway cough syndrome, or persistent postinfectious cough.  - Trial of Zantac, Zyrtec, samples of Tessalon Perles, RTC precautions if not improving in the next 2-4 weeks, sooner if worse  Essential hypertension - Plan: Basic metabolic panel  - Stable, no med changes.  Plan for 3 month follow-up for diabetes.  No orders of the defined types were placed in this encounter.  Patient Instructions   If blood sugar still too high,  would recommend adding medication. One option would be Januvia. Other option would be medication such as Lovie Macadamia, or Invokana. Other class would be med like  trulicity.  You can discuss these with your pharmacist to see if one will be less costly.   For your cough, I will check a chest x-ray today, but that may be due to either postinfectious cough from what you had in June, reflux or possible discharge/drainage from allergies. Start Zantac over-the-counter once per day, and Zyrtec 10 mg once per day. If cough is not improving in the next 2-4 weeks or worsening sooner, return to discuss other potential causes.   Cough, Adult Coughing is a reflex that clears your throat and your airways. Coughing helps to heal and protect your lungs. It is normal to cough occasionally, but a cough that happens with other symptoms or lasts a long time may be a sign of a condition that needs treatment. A cough may last only 2-3 weeks (acute), or it may last longer than 8 weeks (chronic). What are the causes? Coughing is commonly caused by:  Breathing in substances that irritate your lungs.  A viral or bacterial respiratory infection.  Allergies.  Asthma.  Postnasal drip.  Smoking.  Acid backing up from the stomach into the esophagus (gastroesophageal reflux).  Certain medicines.  Chronic lung problems, including COPD (or rarely, lung cancer).  Other medical conditions such as heart failure.  Follow these instructions at home: Pay attention to any changes in your symptoms. Take these actions to help with your discomfort:  Take medicines only as told by your health care provider. ? If you were prescribed an antibiotic medicine, take it as told by your health care provider. Do not stop taking the antibiotic even if you start to feel better. ? Talk with your health care provider before you take a cough suppressant medicine.  Drink enough fluid to keep your urine clear or pale yellow.  If the air is dry, use a cold steam vaporizer or humidifier in your bedroom or your home to help loosen secretions.  Avoid anything that causes you to cough at work or at  home.  If your cough is worse at night, try sleeping in a semi-upright position.  Avoid cigarette smoke. If you smoke, quit smoking. If you need help quitting, ask your health care provider.  Avoid caffeine.  Avoid alcohol.  Rest as needed.  Contact a health care provider if:  You have new symptoms.  You cough up pus.  Your cough does not get better after 2-3 weeks, or your cough gets worse.  You cannot control your cough with suppressant medicines and you are losing sleep.  You develop pain that is getting worse or pain that is not controlled with pain medicines.  You have a fever.  You have unexplained weight loss.  You have night sweats. Get help right away if:  You cough up blood.  You have difficulty breathing.  Your heartbeat is very fast. This information is not intended to replace advice given to you by your health care provider. Make sure you discuss any questions you have with your health care provider. Document Released: 01/08/2011 Document Revised: 12/18/2015 Document Reviewed: 09/18/2014 Elsevier Interactive Patient Education  2017 [employer].       IF you received an x-ray today, you will receive an invoice from [facility] Radiology. Please contact [facility] Radiology at [phone] with questions or concerns regarding your invoice.   IF you received labwork today, you  will receive an invoice from [place]. Please contact [facility] at [phone] with questions or concerns regarding your invoice.   Our billing staff will not be able to assist you with questions regarding bills from these companies.  You will be contacted with the lab results as soon as they are available. The fastest way to get your results is to activate your My Chart account. Instructions are located on the last page of this paperwork. If you have not heard from [place] regarding the results in 2 weeks, please contact this office.        I personally performed the services  described in this documentation, which was scribed in my presence. The recorded information has been reviewed and considered for accuracy and completeness, addended by me as needed, and agree with information above.  Signed,   [name], MD Primary Care at [place].  02/04/17 2:15 PM

## 2017-02-03 NOTE — Patient Instructions (Addendum)
If blood sugar still too high, would recommend adding medication. One option would be Januvia. Other option would be medication such as Lovie Macadamia, or Invokana. Other class would be med like trulicity.  You can discuss these with your pharmacist to see if one will be less costly.   For your cough, I will check a chest x-ray today, but that may be due to either postinfectious cough from what you had in June, reflux or possible discharge/drainage from allergies. Start Zantac over-the-counter once per day, and Zyrtec 10 mg once per day. If cough is not improving in the next 2-4 weeks or worsening sooner, return to discuss other potential causes.   Cough, Adult Coughing is a reflex that clears your throat and your airways. Coughing helps to heal and protect your lungs. It is normal to cough occasionally, but a cough that happens with other symptoms or lasts a long time may be a sign of a condition that needs treatment. A cough may last only 2-3 weeks (acute), or it may last longer than 8 weeks (chronic). What are the causes? Coughing is commonly caused by:  Breathing in substances that irritate your lungs.  A viral or bacterial respiratory infection.  Allergies.  Asthma.  Postnasal drip.  Smoking.  Acid backing up from the stomach into the esophagus (gastroesophageal reflux).  Certain medicines.  Chronic lung problems, including COPD (or rarely, lung cancer).  Other medical conditions such as heart failure.  Follow these instructions at home: Pay attention to any changes in your symptoms. Take these actions to help with your discomfort:  Take medicines only as told by your health care provider. ? If you were prescribed an antibiotic medicine, take it as told by your health care provider. Do not stop taking the antibiotic even if you start to feel better. ? Talk with your health care provider before you take a cough suppressant medicine.  Drink enough fluid to keep your urine  clear or pale yellow.  If the air is dry, use a cold steam vaporizer or humidifier in your bedroom or your home to help loosen secretions.  Avoid anything that causes you to cough at work or at home.  If your cough is worse at night, try sleeping in a semi-upright position.  Avoid cigarette smoke. If you smoke, quit smoking. If you need help quitting, ask your health care provider.  Avoid caffeine.  Avoid alcohol.  Rest as needed.  Contact a health care provider if:  You have new symptoms.  You cough up pus.  Your cough does not get better after 2-3 weeks, or your cough gets worse.  You cannot control your cough with suppressant medicines and you are losing sleep.  You develop pain that is getting worse or pain that is not controlled with pain medicines.  You have a fever.  You have unexplained weight loss.  You have night sweats. Get help right away if:  You cough up blood.  You have difficulty breathing.  Your heartbeat is very fast. This information is not intended to replace advice given to you by your health care provider. Make sure you discuss any questions you have with your health care provider. Document Released: 01/08/2011 Document Revised: 12/18/2015 Document Reviewed: 09/18/2014 Elsevier Interactive Patient Education  2017 [employer].       IF you received an x-ray today, you will receive an invoice from [facility] Radiology. Please contact [facility] Radiology at [phone] with questions or concerns regarding your invoice.   IF  you received labwork today, you will receive an invoice from [place]. Please contact [facility] at [phone] with questions or concerns regarding your invoice.   Our billing staff will not be able to assist you with questions regarding bills from these companies.  You will be contacted with the lab results as soon as they are available. The fastest way to get your results is to activate your My Chart account.  Instructions are located on the last page of this paperwork. If you have not heard from [place] regarding the results in 2 weeks, please contact this office.

## 2017-02-04 LAB — BASIC METABOLIC PANEL
BUN/Creatinine Ratio: 11 (ref 10–24)
BUN: 16 mg/dL (ref 8–27)
CO2: 24 mmol/L (ref 20–29)
Calcium: 9.4 mg/dL (ref 8.6–10.2)
Chloride: 104 mmol/L (ref 96–106)
Creatinine, Ser: 1.5 mg/dL — ABNORMAL HIGH (ref 0.76–1.27)
GFR calc Af Amer: 54 mL/min/{1.73_m2} — ABNORMAL LOW (ref >59)
GFR calc non Af Amer: 46 mL/min/{1.73_m2} — ABNORMAL LOW (ref >59)
Glucose: 171 mg/dL — ABNORMAL HIGH (ref 65–99)
Potassium: 4.8 mmol/L (ref 3.5–5.2)
Sodium: 143 mmol/L (ref 134–144)

## 2017-02-04 LAB — HEMOGLOBIN A1C
Est. average glucose Bld gHb Est-mCnc: 186 mg/dL
Hgb A1c MFr Bld: 8.1 % — ABNORMAL HIGH (ref 4.8–5.6)

## 2017-02-12 ENCOUNTER — Other Ambulatory Visit: Payer: Self-pay | Admitting: Family Medicine

## 2017-03-18 ENCOUNTER — Other Ambulatory Visit: Payer: Self-pay | Admitting: Family Medicine

## 2017-03-23 ENCOUNTER — Telehealth: Payer: Self-pay

## 2017-03-23 NOTE — Telephone Encounter (Signed)
Called pt to schedule Medicare Annual Wellness Visit. -nr  

## 2017-03-29 ENCOUNTER — Ambulatory Visit (INDEPENDENT_AMBULATORY_CARE_PROVIDER_SITE_OTHER): Payer: Medicare Other

## 2017-03-29 VITALS — BP 150/76 | HR 69 | Temp 98.2°F | Ht 64.0 in | Wt 199.1 lb

## 2017-03-29 DIAGNOSIS — Z0000 Encounter for general adult medical examination without abnormal findings: Secondary | ICD-10-CM | POA: Diagnosis not present

## 2017-03-29 MED ORDER — LOSARTAN POTASSIUM 100 MG PO TABS: ORAL_TABLET | ORAL | 0 refills | Status: DC

## 2017-03-29 NOTE — Progress Notes (Signed)
Subjective:   Antonio Wells is a 71 y.o. male who presents for Medicare Annual/Subsequent preventive examination.  Review of Systems:  N/A Cardiac Risk Factors include: advanced age (>40men, >29 women);diabetes mellitus;hypertension;male gender;dyslipidemia;obesity (BMI >30kg/m2)     Objective:    Vitals: BP (!) 150/76   Pulse 69   Temp 98.2 F (36.8 C) (Temporal)   Ht 5\' 4"  (1.626 m)   Wt 199 lb 2 oz (90.3 kg)   BMI 34.18 kg/m   Body mass index is 34.18 kg/m.  Tobacco History  Smoking Status  . Former Smoker  . Packs/day: 2.00  . Years: 15.00  . Types: Cigarettes  . Quit date: 07/26/1998  Smokeless Tobacco  . Never Used     Counseling given: Not Answered   Past Medical History:  Diagnosis Date  . Diabetes mellitus without complication ([place])   . Gout   . Heart murmur   . High cholesterol   . Hypertension   . Sleep apnea    Past Surgical History:  Procedure Laterality Date  . COLONOSCOPY  2008   Family History  Problem Relation Age of Onset  . Hypertension Mother   . Hypertension Father   . Cancer Brother        lung  . Stroke Brother   . Colon cancer Neg Hx    History  Sexual Activity  . Sexual activity: Yes    Outpatient Encounter Prescriptions as of 03/29/2017  Medication Sig  . amLODipine (NORVASC) 10 MG tablet TAKE 1 TABLET DAILY  . aspirin EC 81 MG tablet Take 1 tablet (81 mg total) by mouth daily.  . colchicine 0.6 MG tablet Take 1-2 tablets (0.6-1.2 mg total) by mouth daily as needed (for gout flare). 2 po at onset of flair and then repeat in 1 h for acute gout attack then 1 po qd until pain free  . furosemide (LASIX) 20 MG tablet Take 1 tablet (20 mg total) by mouth daily as needed for edema.  Marland Kitchen glipiZIDE (GLUCOTROL) 10 MG tablet TAKE 1 TABLET BY MOUTH TWICE A DAY BEFORE MEALS.  . hydrochlorothiazide (HYDRODIURIL) 12.5 MG tablet Take 1 tablet (12.5 mg total) by mouth daily.  . isosorbide mononitrate (IMDUR) 30 MG 24 hr tablet TAKE 1  TABLET DAILY  . losartan (COZAAR) 100 MG tablet TAKE 1 TABLET (100 MG TOTAL) BY MOUTH DAILY.  Marland Kitchen lovastatin (MEVACOR) 20 MG tablet Take 1 tablet (20 mg total) by mouth at bedtime.  . metFORMIN (GLUCOPHAGE) 500 MG tablet TAKE 1 TABLET (500 MG TOTAL) BY MOUTH 2 (TWO) TIMES DAILY WITH A MEAL.  . metoprolol (LOPRESSOR) 50 MG tablet TAKE 1 TABLET BY MOUTH TWICE A DAY  . terbinafine (LAMISIL) 250 MG tablet Take 250 mg by mouth daily.  . [DISCONTINUED] losartan (COZAAR) 100 MG tablet TAKE 1 TABLET (100 MG TOTAL) BY MOUTH DAILY.  . [DISCONTINUED] metoprolol (LOPRESSOR) 50 MG tablet Take 1 tablet (50 mg total) by mouth 2 (two) times daily.  . [DISCONTINUED] glipiZIDE (GLUCOTROL) 10 MG tablet TAKE 1 TABLET BY MOUTH TWICE A DAY BEFORE MEALS.  . [DISCONTINUED] glipiZIDE (GLUCOTROL) 10 MG tablet TAKE 1 TABLET BY MOUTH TWICE A DAY BEFORE MEALS.   No facility-administered encounter medications on file as of 03/29/2017.     Activities of Daily Living In your present state of health, do you have any difficulty performing the following activities: 03/29/2017 11/04/2016  Hearing? N N  Vision? N N  Difficulty concentrating or making decisions? N N  Walking or climbing stairs? Y N  Comment Patient has chronic knee pain -  Dressing or bathing? N N  Doing errands, shopping? N N  Preparing Food and eating ? N -  Using the Toilet? N -  In the past six months, have you accidently leaked urine? N -  Do you have problems with loss of bowel control? N -  Managing your Medications? N -  Managing your Finances? N -  Housekeeping or managing your Housekeeping? N -  Some recent data might be hidden    Patient Care Team: [name], MD as PCP - General (Family Medicine) [name], MD as Consulting Physician (Cardiology)   Assessment:     Exercise Activities and Dietary recommendations Current Exercise Habits: Structured exercise class, Type of exercise: strength training/weights (cardio, bike), Time  (Minutes): 60, Frequency (Times/Week): 3, Weekly Exercise (Minutes/Week): 180, Intensity: Moderate, Exercise limited by: None identified  Goals    . Increase water intake          Patient will try to drink more water daily, eat less and lose weight.      Fall Risk Fall Risk  03/29/2017 02/03/2017 12/28/2016 12/24/2016 11/04/2016  Falls in the past year? No No No No No  Number falls in past yr: - - - - -  Injury with Fall? - - - - -   Depression Screen PHQ 2/9 Scores 03/29/2017 02/03/2017 12/28/2016 12/24/2016  PHQ - 2 Score 0 0 0 0    Cognitive Function     6CIT Screen 03/29/2017  What Year? 0 points  What month? 0 points  What time? 0 points  Count back from 20 0 points  Months in reverse 0 points  Repeat phrase 0 points  Total Score 0    Immunization History  Administered Date(s) Administered  . Influenza, Seasonal, Injecte, Preservative Fre 07/03/2012  . Influenza,inj,Quad PF,6+ Mos 08/25/2015  . Influenza-Unspecified 07/24/2016  . Pneumococcal Polysaccharide-23 07/03/2012  . Tdap 07/03/2012   Screening Tests Health Maintenance  Topic Date Due  . OPHTHALMOLOGY EXAM  01/05/2017  . INFLUENZA VACCINE  05/29/2017 (Originally 02/23/2017)  . PNA vac Low Risk Adult (2 of 2 - PCV13) 03/30/2027 (Originally 07/03/2013)  . COLONOSCOPY  07/26/2017  . HEMOGLOBIN A1C  08/06/2017  . FOOT EXAM  02/03/2018  . TETANUS/TDAP  07/03/2022  . Hepatitis C Screening  Completed      Plan:   I have personally reviewed and noted the following in the patient's chart:   . Medical and social history . Use of alcohol, tobacco or illicit drugs  . Current medications and supplements . Functional ability and status . Nutritional status . Physical activity . Advanced directives . List of other physicians . Hospitalizations, surgeries, and ER visits in previous 12 months . Vitals . Screenings to include cognitive, depression, and falls . Referrals and appointments  In addition, I have reviewed and  discussed with patient certain preventive protocols, quality metrics, and best practice recommendations. A written personalized care plan for preventive services as well as general preventive health recommendations were provided to patient.     Andrez Grime, LPN  11/27/5623

## 2017-03-29 NOTE — Patient Instructions (Addendum)
Antonio Wells , Thank you for taking time to come for your Medicare Wellness Visit. I appreciate your ongoing commitment to your health goals. Please review the following plan we discussed and let me know if I can assist you in the future.   Screening recommendations/referrals: Colonoscopy: up to date, next due 07/26/2017 Recommended yearly ophthalmology/optometry visit for glaucoma screening and checkup Recommended yearly dental visit for hygiene and checkup  Vaccinations: Influenza vaccine: You will wait and receive this later in the season.  Pneumococcal vaccine: You refused the Prevnar 13 vaccine today also.  Tdap vaccine: up to date, next due 07/03/2022 Shingles vaccine: you declined this vaccine today.   Advanced directives: Advance directive discussed with you today. Even though you declined this today please call our office should you change your mind and we can give you the proper paperwork for you to fill out.   Conditions/risks identified: Please try to drink more water daily, eat less and lose weight. Will help with overall health.    Next appointment: 05/12/17 @ 9:20 am with Dr. Carlota Raspberry  Preventive Care 70 Years and Older, Male Preventive care refers to lifestyle choices and visits with your health care provider that can promote health and wellness. What does preventive care include?  A yearly physical exam. This is also called an annual well check.  Dental exams once or twice a year.  Routine eye exams. Ask your health care provider how often you should have your eyes checked.  Personal lifestyle choices, including:  Daily care of your teeth and gums.  Regular physical activity.  Eating a healthy diet.  Avoiding tobacco and drug use.  Limiting alcohol use.  Practicing safe sex.  Taking low doses of aspirin every day.  Taking vitamin and mineral supplements as recommended by your health care provider. What happens during an annual well check? The services and  screenings done by your health care provider during your annual well check will depend on your age, overall health, lifestyle risk factors, and family history of disease. Counseling  Your health care provider may ask you questions about your:  Alcohol use.  Tobacco use.  Drug use.  Emotional well-being.  Home and relationship well-being.  Sexual activity.  Eating habits.  History of falls.  Memory and ability to understand (cognition).  Work and work Statistician. Screening  You may have the following tests or measurements:  Height, weight, and BMI.  Blood pressure.  Lipid and cholesterol levels. These may be checked every 5 years, or more frequently if you are over 26 years old.  Skin check.  Lung cancer screening. You may have this screening every year starting at age 70 if you have a 30-pack-year history of smoking and currently smoke or have quit within the past 15 years.  Fecal occult blood test (FOBT) of the stool. You may have this test every year starting at age 70.  Flexible sigmoidoscopy or colonoscopy. You may have a sigmoidoscopy every 5 years or a colonoscopy every 10 years starting at age 70.  Prostate cancer screening. Recommendations will vary depending on your family history and other risks.  Hepatitis C blood test.  Hepatitis B blood test.  Sexually transmitted disease (STD) testing.  Diabetes screening. This is done by checking your blood sugar (glucose) after you have not eaten for a while (fasting). You may have this done every 1-3 years.  Abdominal aortic aneurysm (AAA) screening. You may need this if you are a current or former smoker.  Osteoporosis. You  may be screened starting at age 70 if you are at high risk. Talk with your health care provider about your test results, treatment options, and if necessary, the need for more tests. Vaccines  Your health care provider may recommend certain vaccines, such as:  Influenza vaccine. This is  recommended every year.  Tetanus, diphtheria, and acellular pertussis (Tdap, Td) vaccine. You may need a Td booster every 10 years.  Zoster vaccine. You may need this after age 70.  Pneumococcal 13-valent conjugate (PCV13) vaccine. One dose is recommended after age 70.  Pneumococcal polysaccharide (PPSV23) vaccine. One dose is recommended after age 70. Talk to your health care provider about which screenings and vaccines you need and how often you need them. This information is not intended to replace advice given to you by your health care provider. Make sure you discuss any questions you have with your health care provider. Document Released: 08/08/2015 Document Revised: 03/31/2016 Document Reviewed: 05/13/2015 Elsevier Interactive Patient Education  2017 [place] Prevention in the Home Falls can cause injuries. They can happen to people of all ages. There are many things you can do to make your home safe and to help prevent falls. What can I do on the outside of my home?  Regularly fix the edges of walkways and driveways and fix any cracks.  Remove anything that might make you trip as you walk through a door, such as a raised step or threshold.  Trim any bushes or trees on the path to your home.  Use bright outdoor lighting.  Clear any walking paths of anything that might make someone trip, such as rocks or tools.  Regularly check to see if handrails are loose or broken. Make sure that both sides of any steps have handrails.  Any raised decks and porches should have guardrails on the edges.  Have any leaves, snow, or ice cleared regularly.  Use sand or salt on walking paths during winter.  Clean up any spills in your garage right away. This includes oil or grease spills. What can I do in the bathroom?  Use night lights.  Install grab bars by the toilet and in the tub and shower. Do not use towel bars as grab bars.  Use non-skid mats or decals in the tub or  shower.  If you need to sit down in the shower, use a plastic, non-slip stool.  Keep the floor dry. Clean up any water that spills on the floor as soon as it happens.  Remove soap buildup in the tub or shower regularly.  Attach bath mats securely with double-sided non-slip rug tape.  Do not have throw rugs and other things on the floor that can make you trip. What can I do in the bedroom?  Use night lights.  Make sure that you have a light by your bed that is easy to reach.  Do not use any sheets or blankets that are too big for your bed. They should not hang down onto the floor.  Have a firm chair that has side arms. You can use this for support while you get dressed.  Do not have throw rugs and other things on the floor that can make you trip. What can I do in the kitchen?  Clean up any spills right away.  Avoid walking on wet floors.  Keep items that you use a lot in easy-to-reach places.  If you need to reach something above you, use a strong step stool that  has a grab bar.  Keep electrical cords out of the way.  Do not use floor polish or wax that makes floors slippery. If you must use wax, use non-skid floor wax.  Do not have throw rugs and other things on the floor that can make you trip. What can I do with my stairs?  Do not leave any items on the stairs.  Make sure that there are handrails on both sides of the stairs and use them. Fix handrails that are broken or loose. Make sure that handrails are as long as the stairways.  Check any carpeting to make sure that it is firmly attached to the stairs. Fix any carpet that is loose or worn.  Avoid having throw rugs at the top or bottom of the stairs. If you do have throw rugs, attach them to the floor with carpet tape.  Make sure that you have a light switch at the top of the stairs and the bottom of the stairs. If you do not have them, ask someone to add them for you. What else can I do to help prevent  falls?  Wear shoes that:  Do not have high heels.  Have rubber bottoms.  Are comfortable and fit you well.  Are closed at the toe. Do not wear sandals.  If you use a stepladder:  Make sure that it is fully opened. Do not climb a closed stepladder.  Make sure that both sides of the stepladder are locked into place.  Ask someone to hold it for you, if possible.  Clearly mark and make sure that you can see:  Any grab bars or handrails.  First and last steps.  Where the edge of each step is.  Use tools that help you move around (mobility aids) if they are needed. These include:  Canes.  Walkers.  Scooters.  Crutches.  Turn on the lights when you go into a dark area. Replace any light bulbs as soon as they burn out.  Set up your furniture so you have a clear path. Avoid moving your furniture around.  If any of your floors are uneven, fix them.  If there are any pets around you, be aware of where they are.  Review your medicines with your doctor. Some medicines can make you feel dizzy. This can increase your chance of falling. Ask your doctor what other things that you can do to help prevent falls. This information is not intended to replace advice given to you by your health care provider. Make sure you discuss any questions you have with your health care provider. Document Released: 05/08/2009 Document Revised: 12/18/2015 Document Reviewed: 08/16/2014 Elsevier Interactive Patient Education  2017 [employer].

## 2017-04-14 DIAGNOSIS — M1711 Unilateral primary osteoarthritis, right knee: Secondary | ICD-10-CM | POA: Diagnosis not present

## 2017-04-14 DIAGNOSIS — M1712 Unilateral primary osteoarthritis, left knee: Secondary | ICD-10-CM | POA: Diagnosis not present

## 2017-04-19 ENCOUNTER — Other Ambulatory Visit: Payer: Self-pay | Admitting: Family Medicine

## 2017-04-19 DIAGNOSIS — E11319 Type 2 diabetes mellitus with unspecified diabetic retinopathy without macular edema: Secondary | ICD-10-CM

## 2017-04-19 DIAGNOSIS — E1165 Type 2 diabetes mellitus with hyperglycemia: Principal | ICD-10-CM

## 2017-04-21 ENCOUNTER — Encounter: Payer: Self-pay | Admitting: Family Medicine

## 2017-04-21 ENCOUNTER — Ambulatory Visit (INDEPENDENT_AMBULATORY_CARE_PROVIDER_SITE_OTHER): Payer: Medicare Other | Admitting: Family Medicine

## 2017-04-21 VITALS — BP 140/86 | HR 75 | Temp 98.0°F | Resp 16 | Ht 64.0 in | Wt 201.0 lb

## 2017-04-21 DIAGNOSIS — M1712 Unilateral primary osteoarthritis, left knee: Secondary | ICD-10-CM | POA: Diagnosis not present

## 2017-04-21 DIAGNOSIS — H6121 Impacted cerumen, right ear: Secondary | ICD-10-CM | POA: Diagnosis not present

## 2017-04-21 NOTE — Progress Notes (Signed)
9/27/20183:26 PM  Antonio Wells [birth year], 71 y.o. male [id number]  Chief Complaint  Patient presents with  . Ear Fullness    rt ear pt states he cant hear well/ x 2 days    HPI:   Patient is a 71 y.o. male who presents today for right ear fullness sensation and decreased hearing for past 2 days. Has history of ear wax accumulation of right ear. Tried OTC meds without improvement. Has not used qtips or any other objects in ears. Denies any URI sx, denies any ear pain, drainage, fever or chills.  Depression screen [facility] 2/9 04/21/2017 03/29/2017 02/03/2017  Decreased Interest 0 0 0  Down, Depressed, Hopeless 0 0 0  PHQ - 2 Score 0 0 0    Allergies  Allergen Reactions  . Penicillins Other (See Comments)    Passed out  . Tramadol Other (See Comments)    Hiccups and vomiting  . Cortisone     Hiccups and vomiting  . Lisinopril Cough    Current Outpatient Prescriptions on File Prior to Visit  Medication Sig Dispense Refill  . amLODipine (NORVASC) 10 MG tablet TAKE 1 TABLET DAILY 90 tablet 0  . aspirin EC 81 MG tablet Take 1 tablet (81 mg total) by mouth daily. 30 tablet 0  . colchicine 0.6 MG tablet Take 1-2 tablets (0.6-1.2 mg total) by mouth daily as needed (for gout flare). 2 po at onset of flair and then repeat in 1 h for acute gout attack then 1 po qd until pain free 30 tablet 0  . furosemide (LASIX) 20 MG tablet Take 1 tablet (20 mg total) by mouth daily as needed for edema. 30 tablet 3  . glipiZIDE (GLUCOTROL) 10 MG tablet TAKE 1 TABLET BY MOUTH TWICE A DAY BEFORE MEALS. 180 tablet 1  . hydrochlorothiazide (HYDRODIURIL) 12.5 MG tablet Take 1 tablet (12.5 mg total) by mouth daily. 30 tablet 2  . isosorbide mononitrate (IMDUR) 30 MG 24 hr tablet TAKE 1 TABLET DAILY 90 tablet 0  . losartan (COZAAR) 100 MG tablet TAKE 1 TABLET (100 MG TOTAL) BY MOUTH DAILY. 90 tablet 0  . lovastatin (MEVACOR) 20 MG tablet Take 1 tablet (20 mg total) by mouth at bedtime. 90 tablet 0  .  metFORMIN (GLUCOPHAGE) 500 MG tablet TAKE 1 TABLET (500 MG TOTAL) BY MOUTH 2 (TWO) TIMES DAILY WITH A MEAL. 180 tablet 0  . metoprolol (LOPRESSOR) 50 MG tablet TAKE 1 TABLET BY MOUTH TWICE A DAY 180 tablet 0  . terbinafine (LAMISIL) 250 MG tablet Take 250 mg by mouth daily.     No current facility-administered medications on file prior to visit.     Past Medical History:  Diagnosis Date  . Diabetes mellitus without complication ([place])   . Gout   . Heart murmur   . High cholesterol   . Hypertension   . Sleep apnea     Past Surgical History:  Procedure Laterality Date  . COLONOSCOPY  2008    Social History  Substance Use Topics  . Smoking status: Former Smoker    Packs/day: 2.00    Years: 15.00    Types: Cigarettes    Quit date: 07/26/1998  . Smokeless tobacco: Never Used  . Alcohol use No    Family History  Problem Relation Age of Onset  . Hypertension Mother   . Hypertension Father   . Cancer Brother        lung  . Stroke Brother   . Colon  cancer Neg Hx     ROS Per hpi  OBJECTIVE:  Blood pressure 140/86, pulse 75, temperature 98 F (36.7 C), temperature source Oral, resp. rate 16, height 5\' 4"  (1.626 m), weight 201 lb (91.2 kg), SpO2 97 %.  Physical Exam  Gen: WDWN, appears less than stated age, AAOx3, NAD HEENT: Left external ear, ear canal and TM unremarkable Right external ear unremarkable, canal with impacted cerumen, TM not visualized MMM Neck is supple Breathing comfortably   No results found for this or any previous visit (from the past 24 hour(s)).  No results found.   ASSESSMENT and PLAN  1. Impacted cerumen of right ear - Ear wax removal   No Follow-up on file.    Rutherford Guys, MD [facility] [place], [place] [address] Ph.  [phone] Fax [phone]

## 2017-04-21 NOTE — Patient Instructions (Signed)
     IF you received an x-ray today, you will receive an invoice from [facility]. Please contact [facility] at [phone] with questions or concerns regarding your invoice.   IF you received labwork today, you will receive an invoice from [facility]. Please contact [facility] at [phone] with questions or concerns regarding your invoice.   Our billing staff will not be able to assist you with questions regarding bills from these companies.  You will be contacted with the lab results as soon as they are available. The fastest way to get your results is to activate your My Chart account. Instructions are located on the last page of this paperwork. If you have not heard from us regarding the results in 2 weeks, please contact this office.     

## 2017-04-22 ENCOUNTER — Other Ambulatory Visit: Payer: Self-pay

## 2017-04-27 ENCOUNTER — Ambulatory Visit (INDEPENDENT_AMBULATORY_CARE_PROVIDER_SITE_OTHER): Payer: Medicare Other | Admitting: Cardiology

## 2017-04-27 ENCOUNTER — Encounter: Payer: Self-pay | Admitting: Cardiology

## 2017-04-27 ENCOUNTER — Other Ambulatory Visit: Payer: Self-pay | Admitting: Physician Assistant

## 2017-04-27 ENCOUNTER — Other Ambulatory Visit: Payer: Self-pay | Admitting: Family Medicine

## 2017-04-27 VITALS — BP 126/80 | HR 70 | Ht 64.0 in | Wt 204.0 lb

## 2017-04-27 DIAGNOSIS — I350 Nonrheumatic aortic (valve) stenosis: Secondary | ICD-10-CM | POA: Diagnosis not present

## 2017-04-27 DIAGNOSIS — R600 Localized edema: Secondary | ICD-10-CM

## 2017-04-27 DIAGNOSIS — R0609 Other forms of dyspnea: Secondary | ICD-10-CM | POA: Diagnosis not present

## 2017-04-27 DIAGNOSIS — I5032 Chronic diastolic (congestive) heart failure: Secondary | ICD-10-CM | POA: Diagnosis not present

## 2017-04-27 DIAGNOSIS — R079 Chest pain, unspecified: Secondary | ICD-10-CM | POA: Diagnosis not present

## 2017-04-27 DIAGNOSIS — I119 Hypertensive heart disease without heart failure: Secondary | ICD-10-CM | POA: Diagnosis not present

## 2017-04-27 MED ORDER — FUROSEMIDE 40 MG PO TABS: 40.0000 mg | ORAL_TABLET | Freq: Every day | ORAL | 2 refills | Status: DC

## 2017-04-27 NOTE — Patient Instructions (Addendum)
Medication Instructions:  Your physician recommends that you continue on your current medications as directed. Please refer to the Current Medication list given to you today.  -- If you need a refill on your cardiac medications before your next appointment, please call your pharmacy. --  Labwork: Today: CMET & BNP  Testing/Procedures: Your physician has requested that you have a lower or upper extremity venous duplex. This test is an ultrasound of the veins in the legs or arms. It looks at venous blood flow that carries blood from the heart to the legs or arms. Allow one hour for a Lower Venous exam. Allow thirty minutes for an Upper Venous exam. There are no restrictions or special instructions.  Your physician has requested that you have cardiac CT. Cardiac computed tomography (CT) is a painless test that uses an x-ray machine to take clear, detailed pictures of your heart. For further information please visit [email or web address]. Please follow instruction below listed under Special Instructions.  Our office will call you to schedule this test once we have pre certified it with your  insurance.  Follow-Up: Your physician recommends that you schedule a follow-up appointment in: 2 months with Dr. Meda Coffee or someone on her care team.  Thank you for choosing [facility]!!    CT INSTRUCTIONS  Any Other Special Instructions Will Be Listed Below (If Applicable). Please arrive at the [facility] main entrance of [facility] at xx:xx AM (30-45 minutes prior to test start time)  [facility] [address] [place], [place] [address] [phone]  Proceed to the [facility] Radiology Department (First Floor).  Please follow these instructions carefully (unless otherwise directed):  Hold all erectile dysfunction medications at least 48 hours prior to test.  On the Night Before the Test: . Drink plenty of water. . Do not consume any caffeinated/decaffeinated  beverages or chocolate 12 hours prior to your test. . Do not take any antihistamines 12 hours prior to your test. . If you take Metformin do not take 24 hours prior to test. . If the patient has contrast allergy: ? Patient will need a prescription for Prednisone and very clear instructions (as follows): 1. Prednisone 50 mg - take 13 hours prior to test 2. Take another Prednisone 50 mg 7 hours prior to test 3. Take another Prednisone 50 mg 1 hour prior to test 4. Take Benadryl 50 mg 1 hour prior to test . Patient must complete all four doses of above prophylactic medications. . Patient will need a ride after test due to Benadryl.  On the Day of the Test: . Drink plenty of water. Do not drink any water within one hour of the test. . Do not eat any food 4 hours prior to the test. . You may take your regular medications prior to the test. . IF NOT ON A BETA BLOCKER - Take 50 mg of lopressor (metoprolol) one hour before the test. . HOLD Furosemide morning of the test.  After the Test: . Drink plenty of water. . After receiving IV contrast, you may experience a mild flushed feeling. This is normal. . On occasion, you may experience a mild rash up to 24 hours after the test. This is not dangerous. If this occurs, you can take Benadryl 25 mg and increase your fluid intake. . If you experience trouble breathing, this can be serious. If it is severe call 911 IMMEDIATELY. If it is mild, please call our office. . If you take any of these  medications: Glipizide/Metformin, Avandament, Glucavance, please do not take 48 hours after completing test.

## 2017-04-27 NOTE — Progress Notes (Signed)
Patient ID: Antonio Wells, male   DOB: [birth year], 71 y.o.   MRN: [id number]    Patient Name: Antonio Wells Date of Encounter: 04/27/2017  Primary Care Provider:  [name], MD Primary Cardiologist:  Ena Dawley  Problem List   Past Medical History:  Diagnosis Date  . Diabetes mellitus without complication ([place])   . Gout   . Heart murmur   . High cholesterol   . Hypertension   . Sleep apnea    Past Surgical History:  Procedure Laterality Date  . COLONOSCOPY  2008   Allergies  Allergies  Allergen Reactions  . Penicillins Other (See Comments)    Passed out  . Tramadol Other (See Comments)    Hiccups and vomiting  . Cortisone     Hiccups and vomiting  . Lisinopril Cough   Chief complain: DOE  HPI  A pleasant 71 year-old male with prior medical history of insulin-dependent diabetes mellitus known for the last 5 years, hypertension diagnosed 15 years ago, hyperlipidemia, obstructive sleep apnea, and prior history of smoking, quit 15 years ago who is coming with concern of dyspnea on exertion.the patient is currently retired and states that he used to exercise on a regular basis. Specifically he was use a stationary bike and exercise for up to an hour 3 times a week. However shows he also states that lately he hasn't been that active and noticed that he gets short of breath easily. This is a episodic sometimes he can exercise no problem and then other times he gets short of breath there quickly for example after walking one or 2 flight of stairs. He is very compliant with his medications. He quit smoking 15 years ago. He was prescribed a CPAP machine for obstructive sleep apnea but is not using it. He denies any chest pain left arm pain jaw pain or back pain, no palpitations or syncope no orthopnea or lower extremity edema.  The patient also states that his blood pressure has been uncontrolled lately.  04/30/2016 - This is 6 months follow-up, patient states that  he feels great, he is on and off lower extremity edema mostly toward the end of the day but hasn't been using his furosemide. He is compliant with his meds, and has no muscle pain from statins. His blood pressure has been well controlled, he denies any chest pain, dyspnea on exertion, orthopnea paroxysmal nocturnal dyspnea palpitations or syncope.  04/27/2017 - 1 year follow-up, patient states that he has been dealing with significant knee pain that prevents him from exercising. His getting shots by his orthopedic surgeon. He has also noticed worsening lower extremity edema much more on the left. No orthopnea or proximal nocturnal dyspnea. Within the last few months he has noticed worsening dyspnea on exertion mostly when walking up to here or stairs. This is associated mild chest pressure that resolves at rest.  Home Medications  Prior to Admission medications   Medication Sig Start Date End Date Taking? Authorizing Provider  aspirin EC 81 MG tablet Take 1 tablet (81 mg total) by mouth daily. 05/31/13  Yes Lauren Burnetta Sabin, PA-C  colchicine 0.6 MG tablet Take 1-2 tablets (0.6-1.2 mg total) by mouth daily as needed (for gout flare). 2 po at onset of flair and then repeat in 1 h for acute gout attack then 1 po qd until pain free 05/31/13  Yes Lauren Burnetta Sabin, PA-C  felodipine (PLENDIL) 10 MG 24 hr tablet Take 1 tablet (10 mg total) by  mouth daily. 07/27/13  Yes Mancel Bale, PA-C  glipiZIDE (GLUCOTROL) 10 MG tablet Take 1 tablet (10 mg total) by mouth 2 (two) times daily before a meal. 07/27/13  Yes Mancel Bale, PA-C  glucose blood test strip Use as directed 05/31/13  Yes Lauren Burnetta Sabin, PA-C  losartan-hydrochlorothiazide (HYZAAR) 100-12.5 MG per tablet Take 1 tablet by mouth daily. 10/24/13  Yes [name], MD  lovastatin (MEVACOR) 20 MG tablet Take 1 tablet (20 mg total) by mouth at bedtime. 07/27/13  Yes Mancel Bale, PA-C  metFORMIN (GLUCOPHAGE) 500 MG tablet Take 1 tablet (500 mg total) by mouth 2  (two) times daily with a meal. 07/27/13  Yes Mancel Bale, PA-C  metoprolol succinate (TOPROL-XL) 100 MG 24 hr tablet Take 1 tablet (100 mg total) by mouth daily. 07/27/13  Yes Mancel Bale, PA-C   Family History  Family History  Problem Relation Age of Onset  . Hypertension Mother   . Hypertension Father   . Cancer Brother        lung  . Stroke Brother   . Colon cancer Neg Hx    Social History  Social History   Social History  . Marital status: Married    Spouse name: [name]  . Number of children: 3  . Years of education: 2   Occupational History  . Musician    Social History Main Topics  . Smoking status: Former Smoker    Packs/day: 2.00    Years: 15.00    Types: Cigarettes    Quit date: 07/26/1998  . Smokeless tobacco: Never Used  . Alcohol use No  . Drug use: No  . Sexual activity: Yes   Other Topics Concern  . Not on file   Social History Narrative   Patient is married [name]) and lives at home with his wife.   Patient has three adult children.   Patient is retired.   Patient is a Research officer, political party.   Patient is right-handed.   Patient drinks one cup of coffee daily and 2-3 sodas per day.          Review of Systems, as per HPI, otherwise negative General:  No chills, fever, night sweats or weight changes.  Cardiovascular:  No chest pain, dyspnea on exertion, edema, orthopnea, palpitations, paroxysmal nocturnal dyspnea. Dermatological: No rash, lesions/masses Respiratory: No cough, dyspnea Urologic: No hematuria, dysuria Abdominal:   No nausea, vomiting, diarrhea, bright red blood per rectum, melena, or hematemesis Neurologic:  No visual changes, wkns, changes in mental status. All other systems reviewed and are otherwise negative except as noted above.  Physical Exam  Blood pressure 126/80, pulse 70, height 5\' 4"  (1.626 m), weight 204 lb (92.5 kg), SpO2 98 %.  General: Pleasant, NAD Psych: Normal affect. Neuro: Alert and oriented X 3. Moves all  extremities spontaneously. HEENT: Normal  Neck: Supple without bruits or JVD. Lungs:  Resp regular and unlabored, CTA. Heart: RRR no s3, s4, or 3/6 systolic murmur. Abdomen: Soft, non-tender, non-distended, BS + x 4.  Extremities: No clubbing, cyanosis, B/L LE edema >> Left mostly around the ankle. DP/PT/Radials 2+ and equal bilaterally.  Labs:  No results for input(s): CKTOTAL, CKMB, TROPONINI in the last 72 hours. Lab Results  Component Value Date   WBC 4.1 11/22/2014   HGB 13.3 11/22/2014   HCT 39.9 11/22/2014   MCV 92.8 11/22/2014   PLT 223 11/22/2014    No results found for: DDIMER Invalid input(s): POCBNP  Component Value Date/Time   NA 143 02/03/2017 1052   K 4.8 02/03/2017 1052   CL 104 02/03/2017 1052   CO2 24 02/03/2017 1052   GLUCOSE 171 (H) 02/03/2017 1052   GLUCOSE 151 (H) 04/22/2016 1141   BUN 16 02/03/2017 1052   CREATININE 1.50 (H) 02/03/2017 1052   CREATININE 1.34 (H) 04/22/2016 1141   CALCIUM 9.4 02/03/2017 1052   PROT 6.7 11/04/2016 1040   ALBUMIN 4.6 11/04/2016 1040   AST 18 11/04/2016 1040   ALT 19 11/04/2016 1040   ALKPHOS 57 11/04/2016 1040   BILITOT 0.3 11/04/2016 1040   GFRNONAA 46 (L) 02/03/2017 1052   GFRNONAA 53 (L) 10/15/2015 1638   GFRAA 54 (L) 02/03/2017 1052   GFRAA 61 10/15/2015 1638   Lab Results  Component Value Date   CHOL 146 11/04/2016   HDL 47 11/04/2016   LDLCALC 69 11/04/2016   TRIG 148 11/04/2016   Accessory Clinical Findings  Echocardiogram -12/20/2013   - Left ventricle: The cavity size was normal. There was mild concentric hypertrophy. Systolic function was normal. The estimated ejection fraction was in the range of 60% to 65%. Wall motion was normal; there were no regional wall motion abnormalities. Doppler parameters are consistent with abnormal left ventricular relaxation (grade 1 diastolic dysfunction). The E/e&' ratio is >15, suggesting elevated LV filling pressure. - Aortic valve: Trileaflet; mildly  calcified leaflets. There is mild aortic stenosis. Peak and mean gradients of 18 and 13 mmHg, respectively. Based on an LVOT diameter of 2.2, the calculated AVA is 1.5-1.6 cm2. - Mitral valve: There was mild regurgitation. - Atrial septum: No defect or patent foramen ovale was identified. - Tricuspid valve: There was mild regurgitation. - Pulmonary arteries: PA peak pressure: 37 mm Hg (S). Mild pulmonary hypertension.  Impressions: - LVEF of 60-65%, mild concentric LVH. Diastolic dysfunction with elevated LV filling pressure. Mild aortic stenosis - AVA around 1.6 cm2. Mild pulmonary hypertension - RVSP 37 mmHg.  TTE: 12/2014 - Left ventricle: The cavity size was normal. Systolic function was normal. The estimated ejection fraction was in the range of 60% to 65%. Wall motion was normal; there were no regional wall motion abnormalities. Indeterminate left ventricular filling pressure by Doppler parameters. - Aortic valve: There was very mild stenosis. Valve area (VTI): 1.67 cm^2. Valve area (Vmax): 1.49 cm^2. Valve area (Vmean): 1.29 cm^2. - Pulmonary arteries: PA peak pressure: 32 mm Hg (S).  ECG - Sinus rhythm, nonspecific ST-T wave several days.  Lexiscan nuclear stress test; 12/21/13 Quantitative Gated Spect Images  QGS EDV: 106 ml  QGS ESV: 50 ml  Impression  Exercise Capacity: Lexiscan with low level exercise.  BP Response: Normal blood pressure response.  Clinical Symptoms: There is dyspnea.  ECG Impression: Nonspecific T wave abnormality during exercise and Lexiscan infusion and in recovery  Comparison with Prior Nuclear Study: No images to compare  Overall Impression: Normal stress nuclear study.  LV Ejection Fraction: 53%. LV Wall Motion: NL LV Function; NL Wall Motion  EKG performed today 04/27/2017 shows normal sinus rhythm, negative T waves in leads 3, LVH, unchanged from prior. This is personally reviewed.   Assessment & Plan  71 year old male with  multiple risk factors for coronary artery disease including prior smoking, obesity, insulin-dependent diabetes mellitus, hyperlipidemia, uncontrolled hypertension and untreated obstructive sleep apnea who is coming with concerns of   1. Dyspnea on exertion. Worsening. He underwent an exercise nuclear stress test that was negative for ischemia but showed hypertensive response to stress.  We added Imdur 30 mg daily with improvement of his symptoms, however now worsening and associated with chest pressure. We will schedule coronary CTA for further evaluation.  2. Hypertensive heart disease without CHF, well controlled.   3. Lower extremity edema -  Left worse, we will obtain bilateral lower extremity ultrasound to rule out DVT. I will discontinue hydrochlorothiazide and increase Lasix to 40 mg daily. I will obtain BMP, LFTs and BNP today.  4. Mild aortic stenosis - echo in 2016, I will assess his valve on cardiac CT.  5. Hyperlipidemia - TAG 266, repeat lipids significantly improved reckless rides 121, LDL 70 and HDL 51. Continue pravastatin. Lipids followed by his primary care physician is not fasting today so we cannot check.  Followup in 2 months.   Ena Dawley, MD, [facility] 04/27/2017, 11:51 AM

## 2017-04-28 DIAGNOSIS — M1712 Unilateral primary osteoarthritis, left knee: Secondary | ICD-10-CM | POA: Diagnosis not present

## 2017-04-28 LAB — COMPREHENSIVE METABOLIC PANEL
ALT: 22 IU/L (ref 0–44)
AST: 22 IU/L (ref 0–40)
Albumin/Globulin Ratio: 1.9 (ref 1.2–2.2)
Albumin: 4.7 g/dL (ref 3.5–4.8)
Alkaline Phosphatase: 59 IU/L (ref 39–117)
BUN/Creatinine Ratio: 11 (ref 10–24)
BUN: 14 mg/dL (ref 8–27)
Bilirubin Total: 0.3 mg/dL (ref 0.0–1.2)
CO2: 24 mmol/L (ref 20–29)
Calcium: 9.4 mg/dL (ref 8.6–10.2)
Chloride: 102 mmol/L (ref 96–106)
Creatinine, Ser: 1.33 mg/dL — ABNORMAL HIGH (ref 0.76–1.27)
GFR calc Af Amer: 62 mL/min/{1.73_m2} (ref >59)
GFR calc non Af Amer: 54 mL/min/{1.73_m2} — ABNORMAL LOW (ref >59)
Globulin, Total: 2.5 g/dL (ref 1.5–4.5)
Glucose: 155 mg/dL — ABNORMAL HIGH (ref 65–99)
Potassium: 4.2 mmol/L (ref 3.5–5.2)
Sodium: 145 mmol/L — ABNORMAL HIGH (ref 134–144)
Total Protein: 7.2 g/dL (ref 6.0–8.5)

## 2017-04-28 LAB — PRO B NATRIURETIC PEPTIDE: NT-Pro BNP: 46 pg/mL (ref 0–376)

## 2017-04-28 MED ORDER — LOSARTAN POTASSIUM 100 MG PO TABS: ORAL_TABLET | ORAL | 0 refills | Status: DC

## 2017-04-28 MED ORDER — ISOSORBIDE MONONITRATE ER 30 MG PO TB24: 30.0000 mg | ORAL_TABLET | Freq: Every day | ORAL | 0 refills | Status: DC

## 2017-04-28 MED ORDER — AMLODIPINE BESYLATE 10 MG PO TABS: 10.0000 mg | ORAL_TABLET | Freq: Every day | ORAL | 0 refills | Status: DC

## 2017-04-28 MED ORDER — LOVASTATIN 20 MG PO TABS: 20.0000 mg | ORAL_TABLET | Freq: Every day | ORAL | 0 refills | Status: DC

## 2017-04-28 MED ORDER — METOPROLOL TARTRATE 50 MG PO TABS: 50.0000 mg | ORAL_TABLET | Freq: Two times a day (BID) | ORAL | 0 refills | Status: DC

## 2017-04-28 NOTE — Telephone Encounter (Signed)
Refill req for metoprol, lovastatin, isosorbide, amlodipine losartan [facility] sent.

## 2017-04-28 NOTE — Telephone Encounter (Signed)
Refill req for: metoprol, lovastatin, isosorbide, amlodipine, losartan  for [facility] - sent.  Pt has 3 mo appt scheduled 05/12/2017

## 2017-05-03 ENCOUNTER — Ambulatory Visit (HOSPITAL_COMMUNITY)
Admission: RE | Admit: 2017-05-03 | Discharge: 2017-05-03 | Disposition: A | Discharge status: Home / Self Care | Payer: Medicare Other | Source: Ambulatory Visit | Attending: Cardiovascular Disease | Admitting: Cardiovascular Disease

## 2017-05-03 ENCOUNTER — Telehealth: Payer: Self-pay | Admitting: *Deleted

## 2017-05-03 DIAGNOSIS — R600 Localized edema: Secondary | ICD-10-CM

## 2017-05-03 DIAGNOSIS — M7989 Other specified soft tissue disorders: Secondary | ICD-10-CM | POA: Insufficient documentation

## 2017-05-03 NOTE — Telephone Encounter (Signed)
Notified the pt that per Dr Meda Coffee, he his bilateral lower extremity venous US is negative for DVT.  Pt verbalized understanding.

## 2017-05-03 NOTE — Telephone Encounter (Signed)
-----   Message from [name], MD sent at 05/03/2017  4:09 PM EDT ----- Regarding: FW: Lower Extremity Venous Duplex Please let the patient know  ----- Message ----- From: Charlaine Dalton, RVT Sent: 05/03/2017   3:22 PM To: [name], MD Subject: Lower Extremity Venous Duplex                  Today's bilateral lower extremity venous duplex is negative for DVT.

## 2017-05-05 ENCOUNTER — Telehealth: Payer: Self-pay | Admitting: *Deleted

## 2017-05-05 ENCOUNTER — Encounter: Payer: Self-pay | Admitting: Cardiology

## 2017-05-05 NOTE — Telephone Encounter (Signed)
  Vashti Hey D - 05/05/17 Cardiac CT,schedule on 05/24/17 at 0930.. This patient is on Metformin,please call him with Instruction. Thanks    Please arrive at the [facility] main entrance of [facility] at xx:xx AM (30-45 minutes prior to test start time)  [facility] [address] [place], [place] [address] [phone]  Proceed to the [facility] Radiology Department (First Floor).  Please follow these instructions carefully (unless otherwise directed):  Hold all erectile dysfunction medications at least 48 hours prior to test.  On the Night Before the Test: . Drink plenty of water. . Do not consume any caffeinated/decaffeinated beverages or chocolate 12 hours prior to your test. . Do not take any antihistamines 12 hours prior to your test. . If you take Metformin do not take 24 hours prior to test.   On the Day of the Test: . Drink plenty of water. Do not drink any water within one hour of the test. . Do not eat any food 4 hours prior to the test. . You may take your regular medications prior to the test. . IF NOT ON A BETA BLOCKER - Take 50 mg of lopressor (metoprolol) one hour before the test. . HOLD Furosemide morning of the test.  After the Test: . Drink plenty of water. . After receiving IV contrast, you may experience a mild flushed feeling. This is normal. . On occasion, you may experience a mild rash up to 24 hours after the test. This is not dangerous. If this occurs, you can take Benadryl 25 mg and increase your fluid intake. . If you experience trouble breathing, this can be serious. If it is severe call 911 IMMEDIATELY. If it is mild, please call our office. . If you take any of these medications: Glipizide/Metformin,please do not take 48 hours after completing test.  Informed the pt of coronary ct instructions, for upcoming cta scheduled for 05/24/17.  Reiterated to the pt to hold his Metformin and Glipizide 24 hrs prior to ct, and 48  hours after ct.  Pt verbalized understanding and agrees with this plan.

## 2017-05-08 ENCOUNTER — Other Ambulatory Visit: Payer: Self-pay | Admitting: Family Medicine

## 2017-05-11 ENCOUNTER — Other Ambulatory Visit: Payer: Self-pay | Admitting: Family Medicine

## 2017-05-12 ENCOUNTER — Ambulatory Visit (INDEPENDENT_AMBULATORY_CARE_PROVIDER_SITE_OTHER): Payer: Medicare Other | Admitting: Family Medicine

## 2017-05-12 ENCOUNTER — Encounter: Payer: Self-pay | Admitting: Family Medicine

## 2017-05-12 VITALS — BP 136/82 | HR 71 | Temp 97.6°F | Resp 18 | Ht 63.54 in | Wt 199.4 lb

## 2017-05-12 DIAGNOSIS — E785 Hyperlipidemia, unspecified: Secondary | ICD-10-CM | POA: Diagnosis not present

## 2017-05-12 DIAGNOSIS — Z23 Encounter for immunization: Secondary | ICD-10-CM

## 2017-05-12 DIAGNOSIS — L853 Xerosis cutis: Secondary | ICD-10-CM | POA: Diagnosis not present

## 2017-05-12 DIAGNOSIS — E1165 Type 2 diabetes mellitus with hyperglycemia: Secondary | ICD-10-CM

## 2017-05-12 DIAGNOSIS — I10 Essential (primary) hypertension: Secondary | ICD-10-CM | POA: Diagnosis not present

## 2017-05-12 MED ORDER — ISOSORBIDE MONONITRATE ER 30 MG PO TB24: 30.0000 mg | ORAL_TABLET | Freq: Every day | ORAL | 1 refills | Status: DC

## 2017-05-12 MED ORDER — LOVASTATIN 20 MG PO TABS: 20.0000 mg | ORAL_TABLET | Freq: Every day | ORAL | 1 refills | Status: DC

## 2017-05-12 MED ORDER — CANAGLIFLOZIN 100 MG PO TABS: 100.0000 mg | ORAL_TABLET | Freq: Every day | ORAL | 2 refills | Status: DC

## 2017-05-12 MED ORDER — GLIPIZIDE 10 MG PO TABS: ORAL_TABLET | ORAL | 1 refills | Status: DC

## 2017-05-12 MED ORDER — LOSARTAN POTASSIUM 100 MG PO TABS: ORAL_TABLET | ORAL | 1 refills | Status: DC

## 2017-05-12 MED ORDER — METOPROLOL TARTRATE 50 MG PO TABS: 50.0000 mg | ORAL_TABLET | Freq: Two times a day (BID) | ORAL | 1 refills | Status: DC

## 2017-05-12 MED ORDER — ZOSTER VAC RECOMB ADJUVANTED 50 MCG/0.5ML IM SUSR: 0.5000 mL | Freq: Once | INTRAMUSCULAR | 1 refills | Status: AC

## 2017-05-12 MED ORDER — AMLODIPINE BESYLATE 10 MG PO TABS: 10.0000 mg | ORAL_TABLET | Freq: Every day | ORAL | 1 refills | Status: DC

## 2017-05-12 MED ORDER — METFORMIN HCL 500 MG PO TABS: ORAL_TABLET | ORAL | 1 refills | Status: DC

## 2017-05-12 NOTE — Progress Notes (Signed)
Subjective:  This chart was scribed for [name], MD by Tamsen Roers, at [place] at [facility].  This patient was seen in room 10 and the patient's care was started at 9:49 AM.   Chief Complaint  Patient presents with  . Diabetes    3 month follow-up for diabetes     Patient ID: Antonio Wells, male    DOB: 09-Nov-1945, 71 y.o.   MRN: [id number]  HPI HPI Comments: Antonio Wells is a 71 y.o. male who presents to Primary Care at [facility] for a follow up of diabetes.  Patient is fasting this morning. He would like a flu shot and shingles vaccination (which he has not received in the past).     Diabetes with history of retinopathy and chronic kidney disease: Last visit in July, taking Glucotrol (10 mg twice per day) and Metformin (500 mg twice per day).  We did provide different options for additional medications with plan for him to check into cost to see which one would be most cost effective. ---- He has not been checking his blood sugars at home.  Patient had an eye exam completed October 9th, retinopathy was found to be stable and "looked fine".    Lab Results  Component Value Date   HGBA1C 8.1 (H) 02/03/2017    Chronic kidney disease: Creatinine had increased from 1.29 to 1.5 in July but down to 1.33 on October 3rd.     Hypertension with dyspnea on exertion:  He is on Amlodipine 10 mg, Lasix 40 mg, Imdur 30 mg, Losartan 100 mg and Metoprolol 50 mg twice per day. Followed by cardiology: Last seen October 3rd, added Imdur 30 mg previously planned on coronary CTA due to worsening dyspnea and chest pressure.  ---  Coronary CTA has been scheduled for October 30 th.       Lower extremity edema: HCTZ was discontinued, Lasix increased to 40 mg QD. Planned for follow up in two months with cardiology. --- Patient has been using his Lasix daily for the  past 2-3 months.  Lab Results  Component Value Date   CREATININE 1.33 (H) 04/27/2017    Dry skin: Patient has been  having dry skin on his lower extremities bilaterally and has been using a lotion which he received from a podiatrist.    Knee: Patient is planning on having a knee replacement by Dr Mayer Camel (who he has already been seeing).  He has tried injections in the past but thinks its time to consider having surgery.      Patient Active Problem List   Diagnosis Date Noted  . Hypertensive heart disease 07/14/2015  . Lower extremity edema 07/14/2015  . Aortic stenosis 01/09/2015  . OSA on CPAP 12/12/2014  . Hypersomnia with sleep apnea 12/12/2014  . Obstructive sleep apnea (adult) (pediatric) 02/04/2014  . 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 Obesity 02/04/2014  . Mitral regurgitation and aortic stenosis 01/02/2014  . DOE (dyspnea on exertion) 11/26/2013  . Gout 08/12/2011  . Hypertension 08/12/2011  . High cholesterol 08/12/2011  . OSA (obstructive sleep apnea) 08/12/2011  . Diabetes type 2, uncontrolled ([place]) 08/12/2011   Past Medical History:  Diagnosis Date  . Diabetes mellitus without complication ([place])   . Gout   . Heart murmur   . High cholesterol   . Hypertension   . Sleep apnea    Past Surgical History:  Procedure Laterality Date  . COLONOSCOPY  2008   Allergies  Allergen Reactions  .  Penicillins Other (See Comments)    Passed out  . Tramadol Other (See Comments)    Hiccups and vomiting  . Cortisone     Hiccups and vomiting  . Lisinopril Cough   Prior to Admission medications   Medication Sig Start Date End Date Taking? Authorizing Provider  amLODipine (NORVASC) 10 MG tablet Take 1 tablet (10 mg total) by mouth daily. 04/28/17   [name], MD  aspirin EC 81 MG tablet Take 1 tablet (81 mg total) by mouth daily. 05/31/13   Harvie Heck, PA-C  colchicine 0.6 MG tablet Take 1-2 tablets (0.6-1.2 mg total) by mouth daily as needed (for gout flare). 2 po at onset of flair and then repeat in 1 h for acute gout attack then 1 po qd until pain free 05/31/13   Harvie Heck, PA-C  furosemide (LASIX) 40 MG tablet Take 1 tablet (40 mg total) by mouth daily. 04/27/17   [name], MD  glipiZIDE (GLUCOTROL) 10 MG tablet TAKE 1 TABLET BY MOUTH TWICE A DAY BEFORE MEALS. 09/30/16   [name], MD  isosorbide mononitrate (IMDUR) 30 MG 24 hr tablet Take 1 tablet (30 mg total) by mouth daily. 04/28/17   [name], MD  losartan (COZAAR) 100 MG tablet TAKE 1 TABLET (100 MG TOTAL) BY MOUTH DAILY. 04/28/17   [name], MD  lovastatin (MEVACOR) 20 MG tablet Take 1 tablet (20 mg total) by mouth at bedtime. 04/28/17   [name], MD  metFORMIN (GLUCOPHAGE) 500 MG tablet TAKE 1 TABLET (500 MG TOTAL) BY MOUTH 2 (TWO) TIMES DAILY WITH A MEAL. 10/29/16   [name], MD  metoprolol tartrate (LOPRESSOR) 50 MG tablet Take 1 tablet (50 mg total) by mouth 2 (two) times daily. 04/28/17   [name], MD  terbinafine (LAMISIL) 250 MG tablet Take 250 mg by mouth daily.    [provider]   Social History   Social History  . Marital status: Married    Spouse name: [name]  . Number of children: 3  . Years of education: 79   Occupational History  . Musician    Social History Main Topics  . Smoking status: Former Smoker    Packs/day: 2.00    Years: 15.00    Types: Cigarettes    Quit date: 07/26/1998  . Smokeless tobacco: Never Used  . Alcohol use No  . Drug use: No  . Sexual activity: Yes   Other Topics Concern  . Not on file   Social History Narrative   Patient is married [name]) and lives at home with his wife.   Patient has three adult children.   Patient is retired.   Patient is a Research officer, political party.   Patient is right-handed.   Patient drinks one cup of coffee daily and 2-3 sodas per day.          Review of Systems  Constitutional: Negative for fatigue and unexpected weight change.  Eyes: Negative for visual disturbance.  Respiratory: Negative for cough, chest tightness and shortness of breath.   Cardiovascular:  Negative for chest pain, palpitations and leg swelling.  Gastrointestinal: Negative for abdominal pain and blood in stool.  Musculoskeletal:       Knee pain  Skin:       Dry skin  Neurological: Negative for dizziness, light-headedness and headaches.       Objective:   Physical Exam  Constitutional: He is oriented to person, place, and time. He  appears well-developed and well-nourished.  HENT:  Head: Normocephalic and atraumatic.  Eyes: Pupils are equal, round, and reactive to light. EOM are normal.  Neck: No JVD present. Carotid bruit is not present.  Cardiovascular: Normal rate, regular rhythm and normal heart sounds.   No murmur heard. Pulmonary/Chest: Effort normal and breath sounds normal. He has no rales.  Musculoskeletal: He exhibits no edema.  Neurological: He is alert and oriented to person, place, and time.  Skin: Skin is warm and dry.  Some dry skin lower extremities bilaterally, no focal rash or ulcerations.   Psychiatric: He has a normal mood and affect.  Vitals reviewed.  Vitals:   05/12/17 0921  BP: 136/82  Pulse: 71  Resp: 18  Temp: 97.6 F (36.4 C)  TempSrc: Oral  SpO2: 98%  Weight: 199 lb 6.4 oz (90.4 kg)  Height: 5' 3.54" (1.614 m)      Assessment & Plan:   Antonio Wells is a 71 y.o. male Type 2 diabetes mellitus with hyperglycemia, without long-term current use of insulin (HCC) - Plan: Hemoglobin A1c, glipiZIDE (GLUCOTROL) 10 MG tablet, metFORMIN (GLUCOPHAGE) 500 MG tablet, canagliflozin (INVOKANA) 100 MG TABS tablet, DISCONTINUED: canagliflozin (INVOKANA) 100 MG TABS tablet, DISCONTINUED: canagliflozin (INVOKANA) 100 MG TABS tablet  - Uncontrolled previously, will recheck A1c, prescription for Invokana given to fill if elevated as that appears to be less costly option. We discussed potential peripheral vascular disease and potential amputation risk although unlikely. Understanding expressed.  Need for influenza vaccination - Plan: Flu Vaccine QUAD  36+ mos IM  Essential hypertension - Plan: losartan (COZAAR) 100 MG tablet, Comprehensive metabolic panel, amLODipine (NORVASC) 10 MG tablet, metoprolol tartrate (LOPRESSOR) 50 MG tablet, isosorbide mononitrate (IMDUR) 30 MG 24 hr tablet  -Overall stable. Continue same dose of medications. I will check a CMP to evaluate his renal function with higher dose of Lasix from cardiology.  Need for shingles vaccine - Plan: Zoster Vaccine Adjuvanted [facility]) injection sent to pharmacy  Dry skin  -Lower legs, may have some component of stasis dermatitis, but primarily appears to be dry skin. Over-the-counter Aveeno or Eucerin was discussed. RTC precautions if persistent/worsening  Hyperlipidemia, unspecified hyperlipidemia type - Plan: Comprehensive metabolic panel, Lipid panel, lovastatin (MEVACOR) 20 MG tablet  -Check CMP, lipid panel, continue lovastatin as currently tolerated  We did discuss potential knee surgery, would need cardiology clearance for that surgery.  Meds ordered this encounter  Medications  . losartan (COZAAR) 100 MG tablet    Sig: TAKE 1 TABLET (100 MG TOTAL) BY MOUTH DAILY.    Dispense:  90 tablet    Refill:  1  . DISCONTD: canagliflozin (INVOKANA) 100 MG TABS tablet    Sig: Take 1 tablet (100 mg total) by mouth daily before breakfast.    Dispense:  30 tablet    Refill:  2  . amLODipine (NORVASC) 10 MG tablet    Sig: Take 1 tablet (10 mg total) by mouth daily.    Dispense:  90 tablet    Refill:  1  . glipiZIDE (GLUCOTROL) 10 MG tablet    Sig: TAKE 1 TABLET BY MOUTH TWICE A DAY BEFORE MEALS.    Dispense:  180 tablet    Refill:  1  . lovastatin (MEVACOR) 20 MG tablet    Sig: Take 1 tablet (20 mg total) by mouth at bedtime.    Dispense:  90 tablet    Refill:  1  . metFORMIN (GLUCOPHAGE) 500 MG tablet    Sig: TAKE  1 TABLET (500 MG TOTAL) BY MOUTH 2 (TWO) TIMES DAILY WITH A MEAL.    Dispense:  180 tablet    Refill:  1  . metoprolol tartrate (LOPRESSOR) 50 MG tablet      Sig: Take 1 tablet (50 mg total) by mouth 2 (two) times daily.    Dispense:  180 tablet    Refill:  1  . isosorbide mononitrate (IMDUR) 30 MG 24 hr tablet    Sig: Take 1 tablet (30 mg total) by mouth daily.    Dispense:  90 tablet    Refill:  1  . Zoster Vaccine Adjuvanted [facility]) injection    Sig: Inject 0.5 mLs into the muscle once. Repeat in 2-6 months.    Dispense:  0.5 mL    Refill:  1  . DISCONTD: canagliflozin (INVOKANA) 100 MG TABS tablet    Sig: Take 1 tablet (100 mg total) by mouth daily before breakfast.    Dispense:  30 tablet    Refill:  2  . canagliflozin (INVOKANA) 100 MG TABS tablet    Sig: Take 1 tablet (100 mg total) by mouth daily before breakfast.    Dispense:  30 tablet    Refill:  2   Patient Instructions   I will recheck kidney function today on higher dose of Lasix.   If A1c is elevated - start Invokana.   Flu vaccine given, shingles vaccine sent to your pharmacy.   If surgery clearance needed, may need to meet with your cardiologist.   Try aveeno or eucerin for dry skin on legs. Return if that does not help.   Recheck in 3 months.   IF you received an x-ray today, you will receive an invoice from [facility]. Please contact [facility] at [phone] with questions or concerns regarding your invoice.   IF you received labwork today, you will receive an invoice from [place]. Please contact [facility] at [phone] with questions or concerns regarding your invoice.   Our billing staff will not be able to assist you with questions regarding bills from these companies.  You will be contacted with the lab results as soon as they are available. The fastest way to get your results is to activate your My Chart account. Instructions are located on the last page of this paperwork. If you have not heard from [place] regarding the results in 2 weeks, please contact this office.      I personally performed the services described in this  documentation, which was scribed in my presence. The recorded information has been reviewed and considered for accuracy and completeness, addended by me as needed, and agree with information above.  Signed,   [name], MD [facility] at [place].  05/14/17 10:08 AM

## 2017-05-12 NOTE — Patient Instructions (Addendum)
I will recheck kidney function today on higher dose of Lasix.   If A1c is elevated - start Invokana.   Flu vaccine given, shingles vaccine sent to your pharmacy.   If surgery clearance needed, may need to meet with your cardiologist.   Try aveeno or eucerin for dry skin on legs. Return if that does not help.   Recheck in 3 months.   IF you received an x-ray today, you will receive an invoice from [facility]. Please contact [facility] at [phone] with questions or concerns regarding your invoice.   IF you received labwork today, you will receive an invoice from [place] [facility]. Please contact [facility] at [phone] with questions or concerns regarding your invoice.   Our billing staff will not be able to assist you with questions regarding bills from these companies.  You will be contacted with the lab results as soon as they are available. The fastest way to get your results is to activate your My Chart account. Instructions are located on the last page of this paperwork. If you have not heard from [place] regarding the results in 2 weeks, please contact this office.

## 2017-05-13 LAB — COMPREHENSIVE METABOLIC PANEL
ALK PHOS: 61 IU/L (ref 39–117)
ALT: 22 IU/L (ref 0–44)
AST: 21 IU/L (ref 0–40)
Albumin/Globulin Ratio: 1.8 (ref 1.2–2.2)
Albumin: 4.9 g/dL — ABNORMAL HIGH (ref 3.5–4.8)
BILIRUBIN TOTAL: 0.2 mg/dL (ref 0.0–1.2)
BUN/Creatinine Ratio: 12 (ref 10–24)
BUN: 19 mg/dL (ref 8–27)
CO2: 26 mmol/L (ref 20–29)
Calcium: 9.6 mg/dL (ref 8.6–10.2)
Chloride: 103 mmol/L (ref 96–106)
Creatinine, Ser: 1.61 mg/dL — ABNORMAL HIGH (ref 0.76–1.27)
GFR calc Af Amer: 49 mL/min/{1.73_m2} — ABNORMAL LOW (ref >59)
GFR calc non Af Amer: 43 mL/min/{1.73_m2} — ABNORMAL LOW (ref >59)
GLUCOSE: 178 mg/dL — AB (ref 65–99)
Globulin, Total: 2.7 g/dL (ref 1.5–4.5)
Potassium: 4.3 mmol/L (ref 3.5–5.2)
Sodium: 144 mmol/L (ref 134–144)
TOTAL PROTEIN: 7.6 g/dL (ref 6.0–8.5)

## 2017-05-13 LAB — LIPID PANEL
CHOLESTEROL TOTAL: 159 mg/dL (ref 100–199)
Chol/HDL Ratio: 3.5 ratio (ref 0.0–5.0)
HDL: 45 mg/dL (ref >39)
LDL Calculated: 81 mg/dL (ref 0–99)
TRIGLYCERIDES: 166 mg/dL — AB (ref 0–149)
VLDL CHOLESTEROL CAL: 33 mg/dL (ref 5–40)

## 2017-05-13 LAB — HEMOGLOBIN A1C
Est. average glucose Bld gHb Est-mCnc: 197 mg/dL
Hgb A1c MFr Bld: 8.5 % — ABNORMAL HIGH (ref 4.8–5.6)

## 2017-05-24 ENCOUNTER — Encounter (HOSPITAL_COMMUNITY): Payer: Self-pay

## 2017-05-24 ENCOUNTER — Ambulatory Visit (HOSPITAL_COMMUNITY)
Admission: RE | Admit: 2017-05-24 | Discharge: 2017-05-24 | Disposition: A | Discharge status: Home / Self Care | Payer: Medicare Other | Source: Ambulatory Visit | Attending: Cardiology | Admitting: Cardiology

## 2017-05-24 ENCOUNTER — Telehealth: Payer: Self-pay | Admitting: Cardiology

## 2017-05-24 ENCOUNTER — Encounter (HOSPITAL_COMMUNITY): Payer: Self-pay | Admitting: Emergency Medicine

## 2017-05-24 DIAGNOSIS — N183 Chronic kidney disease, stage 3 (moderate): Secondary | ICD-10-CM | POA: Diagnosis not present

## 2017-05-24 DIAGNOSIS — E1122 Type 2 diabetes mellitus with diabetic chronic kidney disease: Secondary | ICD-10-CM | POA: Insufficient documentation

## 2017-05-24 DIAGNOSIS — R079 Chest pain, unspecified: Secondary | ICD-10-CM

## 2017-05-24 DIAGNOSIS — Z7984 Long term (current) use of oral hypoglycemic drugs: Secondary | ICD-10-CM | POA: Insufficient documentation

## 2017-05-24 DIAGNOSIS — I2510 Atherosclerotic heart disease of native coronary artery without angina pectoris: Secondary | ICD-10-CM | POA: Diagnosis not present

## 2017-05-24 DIAGNOSIS — Z880 Allergy status to penicillin: Secondary | ICD-10-CM | POA: Diagnosis not present

## 2017-05-24 DIAGNOSIS — Z6835 Body mass index (BMI) 35.0-35.9, adult: Secondary | ICD-10-CM | POA: Insufficient documentation

## 2017-05-24 DIAGNOSIS — I10 Essential (primary) hypertension: Secondary | ICD-10-CM | POA: Diagnosis not present

## 2017-05-24 DIAGNOSIS — R9439 Abnormal result of other cardiovascular function study: Secondary | ICD-10-CM | POA: Diagnosis not present

## 2017-05-24 DIAGNOSIS — M109 Gout, unspecified: Secondary | ICD-10-CM | POA: Insufficient documentation

## 2017-05-24 DIAGNOSIS — I25110 Atherosclerotic heart disease of native coronary artery with unstable angina pectoris: Secondary | ICD-10-CM | POA: Insufficient documentation

## 2017-05-24 DIAGNOSIS — I1310 Hypertensive heart and chronic kidney disease without heart failure, with stage 1 through stage 4 chronic kidney disease, or unspecified chronic kidney disease: Secondary | ICD-10-CM | POA: Diagnosis not present

## 2017-05-24 DIAGNOSIS — R0609 Other forms of dyspnea: Principal | ICD-10-CM

## 2017-05-24 DIAGNOSIS — Z79899 Other long term (current) drug therapy: Secondary | ICD-10-CM | POA: Diagnosis not present

## 2017-05-24 DIAGNOSIS — Z7982 Long term (current) use of aspirin: Secondary | ICD-10-CM | POA: Insufficient documentation

## 2017-05-24 DIAGNOSIS — E785 Hyperlipidemia, unspecified: Secondary | ICD-10-CM | POA: Insufficient documentation

## 2017-05-24 DIAGNOSIS — G4710 Hypersomnia, unspecified: Secondary | ICD-10-CM | POA: Insufficient documentation

## 2017-05-24 DIAGNOSIS — Z87891 Personal history of nicotine dependence: Secondary | ICD-10-CM | POA: Diagnosis not present

## 2017-05-24 DIAGNOSIS — N179 Acute kidney failure, unspecified: Secondary | ICD-10-CM | POA: Diagnosis not present

## 2017-05-24 DIAGNOSIS — G4733 Obstructive sleep apnea (adult) (pediatric): Secondary | ICD-10-CM | POA: Diagnosis not present

## 2017-05-24 DIAGNOSIS — Z888 Allergy status to other drugs, medicaments and biological substances status: Secondary | ICD-10-CM | POA: Diagnosis not present

## 2017-05-24 DIAGNOSIS — I200 Unstable angina: Secondary | ICD-10-CM | POA: Diagnosis not present

## 2017-05-24 DIAGNOSIS — E7800 Pure hypercholesterolemia, unspecified: Secondary | ICD-10-CM | POA: Diagnosis not present

## 2017-05-24 DIAGNOSIS — I5032 Chronic diastolic (congestive) heart failure: Secondary | ICD-10-CM

## 2017-05-24 DIAGNOSIS — E669 Obesity, unspecified: Secondary | ICD-10-CM | POA: Diagnosis not present

## 2017-05-24 DIAGNOSIS — I350 Nonrheumatic aortic (valve) stenosis: Secondary | ICD-10-CM | POA: Insufficient documentation

## 2017-05-24 DIAGNOSIS — N189 Chronic kidney disease, unspecified: Secondary | ICD-10-CM | POA: Diagnosis not present

## 2017-05-24 LAB — BASIC METABOLIC PANEL
Anion gap: 10 (ref 5–15)
BUN: 17 mg/dL (ref 6–20)
CALCIUM: 8.9 mg/dL (ref 8.9–10.3)
CHLORIDE: 101 mmol/L (ref 101–111)
CO2: 26 mmol/L (ref 22–32)
CREATININE: 1.99 mg/dL — AB (ref 0.61–1.24)
GFR calc Af Amer: 37 mL/min — ABNORMAL LOW (ref >60)
GFR calc non Af Amer: 32 mL/min — ABNORMAL LOW (ref >60)
Glucose, Bld: 216 mg/dL — ABNORMAL HIGH (ref 65–99)
Potassium: 3.5 mmol/L (ref 3.5–5.1)
SODIUM: 137 mmol/L (ref 135–145)

## 2017-05-24 LAB — CBC
HCT: 38.2 % — ABNORMAL LOW (ref 39.0–52.0)
Hemoglobin: 13.2 g/dL (ref 13.0–17.0)
MCH: 30.7 pg (ref 26.0–34.0)
MCHC: 34.6 g/dL (ref 30.0–36.0)
MCV: 88.8 fL (ref 78.0–100.0)
PLATELETS: 216 10*3/uL (ref 150–400)
RBC: 4.3 MIL/uL (ref 4.22–5.81)
RDW: 13.3 % (ref 11.5–15.5)
WBC: 4.6 10*3/uL (ref 4.0–10.5)

## 2017-05-24 LAB — I-STAT TROPONIN, ED: TROPONIN I, POC: 0 ng/mL (ref 0.00–0.08)

## 2017-05-24 MED ORDER — LORAZEPAM 2 MG/ML IJ SOLN
INTRAMUSCULAR | Status: AC
  Administered 2017-05-24: 1 mg
  Filled 2017-05-24: qty 1

## 2017-05-24 MED ORDER — METOPROLOL TARTRATE 5 MG/5ML IV SOLN
INTRAVENOUS | Status: AC
  Administered 2017-05-24: 5 mg
  Filled 2017-05-24: qty 5

## 2017-05-24 MED ORDER — METOPROLOL TARTRATE 5 MG/5ML IV SOLN
5.0000 mg | INTRAVENOUS | Status: AC
  Administered 2017-05-24: 5 mg via INTRAVENOUS

## 2017-05-24 MED ORDER — NITROGLYCERIN 0.4 MG SL SUBL
SUBLINGUAL_TABLET | SUBLINGUAL | Status: AC
  Administered 2017-05-24: 0.8 mg via SUBLINGUAL
  Filled 2017-05-24: qty 2

## 2017-05-24 MED ORDER — METOPROLOL TARTRATE 5 MG/5ML IV SOLN
INTRAVENOUS | Status: AC
  Administered 2017-05-24: 5 mg via INTRAVENOUS
  Filled 2017-05-24: qty 5

## 2017-05-24 MED ORDER — NITROGLYCERIN 0.4 MG SL SUBL
0.8000 mg | SUBLINGUAL_TABLET | SUBLINGUAL | Status: DC | PRN
  Administered 2017-05-24: 0.8 mg via SUBLINGUAL

## 2017-05-24 MED ORDER — IOPAMIDOL (ISOVUE-370) INJECTION 76%
INTRAVENOUS | Status: AC
  Administered 2017-05-24: 80 mL via INTRAVENOUS
  Filled 2017-05-24: qty 100

## 2017-05-24 NOTE — Telephone Encounter (Signed)
Notified by Dr. 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 I called the patient per her request and asked that he come to the ED. Needs to be admitted for heart cath tomorrow. Will notify Cardiologist coming on call.

## 2017-05-24 NOTE — H&P (Signed)
Patient ID: Antonio Wells MRN: [id number], DOB/AGE: [birth year]   Admit date: (Not on file)   rimary Physician: [name], MD  Primary Cardiologist:  Ena Dawley  Pt. Profile: 70 yo male w/ DMII, HTN, CKD, prior smoker and mild AS who underwent CT FFR, found to have severe 2 vessel disease, including ostial LAD.   Problem List  Past Medical History:  Diagnosis Date  . Diabetes mellitus without complication ([place])   . Gout   . Heart murmur   . High cholesterol   . Hypertension   . Sleep apnea     Past Surgical History:  Procedure Laterality Date  . COLONOSCOPY  2008     Allergies  Allergies  Allergen Reactions  . Penicillins Other (See Comments)    Passed out  . Tramadol Other (See Comments)    Hiccups and vomiting  . Cortisone     Hiccups and vomiting  . Lisinopril Cough    HPI 71 yo male w/ DMII, HTN, CKD and mild AS who underwent CT FFR, found to have severe 2 vessel disease, including ostial LAD.   Seen by Dr. Meda Coffee in 10/3 for DOE that has been worsening. Underwent CTA for further evaluation and found to have severe disease. Brought to ED to undergo further care and LHC.   Home Medications  Prior to Admission medications   Medication Sig Start Date End Date Taking? Authorizing Provider  amLODipine (NORVASC) 10 MG tablet Take 1 tablet (10 mg total) by mouth daily. 05/12/17   [name], MD  aspirin EC 81 MG tablet Take 1 tablet (81 mg total) by mouth daily. 05/31/13   Harvie Heck, PA-C  canagliflozin (INVOKANA) 100 MG TABS tablet Take 1 tablet (100 mg total) by mouth daily before breakfast. 05/12/17   [name], MD  colchicine 0.6 MG tablet Take 1-2 tablets (0.6-1.2 mg total) by mouth daily as needed (for gout flare). 2 po at onset of flair and then repeat in 1 h for acute gout attack then 1 po qd until pain free 05/31/13   Harvie Heck, PA-C  furosemide (LASIX) 40 MG tablet Take 1 tablet (40 mg total) by mouth daily.  04/27/17   [name], MD  glipiZIDE (GLUCOTROL) 10 MG tablet TAKE 1 TABLET BY MOUTH TWICE A DAY BEFORE MEALS. 05/12/17   [name], MD  isosorbide mononitrate (IMDUR) 30 MG 24 hr tablet Take 1 tablet (30 mg total) by mouth daily. 05/12/17   [name], MD  losartan (COZAAR) 100 MG tablet TAKE 1 TABLET (100 MG TOTAL) BY MOUTH DAILY. 05/12/17   [name], MD  lovastatin (MEVACOR) 20 MG tablet Take 1 tablet (20 mg total) by mouth at bedtime. 05/12/17   [name], MD  metFORMIN (GLUCOPHAGE) 500 MG tablet TAKE 1 TABLET (500 MG TOTAL) BY MOUTH 2 (TWO) TIMES DAILY WITH A MEAL. 05/12/17   [name], MD  metoprolol tartrate (LOPRESSOR) 50 MG tablet Take 1 tablet (50 mg total) by mouth 2 (two) times daily. 05/12/17   [name], MD  terbinafine (LAMISIL) 250 MG tablet Take 250 mg by mouth daily.    [provider]    Family History  Family History  Problem Relation Age of Onset  . Hypertension Mother   . Hypertension Father   . Cancer Brother        lung  . Stroke Brother   . Colon cancer Neg Hx  Social History  Social History   Social History  . Marital status: Married    Spouse name: [name]  . Number of children: 3  . Years of education: 3   Occupational History  . Musician    Social History Main Topics  . Smoking status: Former Smoker    Packs/day: 2.00    Years: 15.00    Types: Cigarettes    Quit date: 07/26/1998  . Smokeless tobacco: Never Used  . Alcohol use No  . Drug use: No  . Sexual activity: Yes   Other Topics Concern  . Not on file   Social History Narrative   Patient is married [name]) and lives at home with his wife.   Patient has three adult children.   Patient is retired.   Patient is a Research officer, political party.   Patient is right-handed.   Patient drinks one cup of coffee daily and 2-3 sodas per day.           Review of Systems General:  No chills, fever, night sweats or weight changes.    Cardiovascular:  No chest pain, dyspnea on exertion, edema, orthopnea, palpitations, paroxysmal nocturnal dyspnea. Dermatological: No rash, lesions/masses Respiratory: No cough, dyspnea Urologic: No hematuria, dysuria Abdominal:   No nausea, vomiting, diarrhea, bright red blood per rectum, melena, or hematemesis Neurologic:  No visual changes, wkns, changes in mental status. All other systems reviewed and are otherwise negative except as noted above.  Physical Exam  Blood pressure (!) 172/85, pulse 73, temperature 99 F (37.2 C), temperature source Oral, resp. rate 18, height 5\' 4"  (1.626 m), weight 90.3 kg (199 lb), SpO2 97 %.  General: Pleasant, NAD Psych: Normal affect. Neuro: Alert and oriented X 3. Moves all extremities spontaneously. HEENT: Normal  Neck: Supple without bruits or JVD. Lungs:  Resp regular and unlabored, CTA. Heart: RRR no s3, s4, or murmurs. Abdomen: Soft, non-tender, non-distended, BS + x 4.  Extremities: No clubbing, cyanosis or edema. DP/PT/Radials 2+ and equal bilaterally.  Labs  Troponin [facility] of Care Test)  Recent Labs  05/24/17 2242  TROPIPOC 0.00   No results for input(s): CKTOTAL, CKMB, TROPONINI in the last 72 hours. Lab Results  Component Value Date   WBC 4.6 05/24/2017   HGB 13.2 05/24/2017   HCT 38.2 (L) 05/24/2017   MCV 88.8 05/24/2017   PLT 216 05/24/2017    Recent Labs Lab 05/24/17 2228  NA 137  K 3.5  CL 101  CO2 26  BUN 17  CREATININE 1.99*  CALCIUM 8.9  GLUCOSE 216*   Lab Results  Component Value Date   CHOL 159 05/12/2017   HDL 45 05/12/2017   LDLCALC 81 05/12/2017   TRIG 166 (H) 05/12/2017   No results found for: DDIMER   Radiology/Studies  Ct Coronary Morph W/cta Cor W/score W/ca W/cm &/or Wo/cm  Addendum Date: 05/24/2017   ADDENDUM REPORT: 05/24/2017 17:55 CLINICAL DATA:  71 year old male with h/o smoking, obesity, insulin-dependent diabetes mellitus, hyperlipidemia, uncontrolled hypertension, OSA, with  DOE and negative stress test. EXAM: Cardiac/Coronary  CT TECHNIQUE: The patient was scanned on a [employer]. FINDINGS: A 120 kV prospective scan was triggered in the descending thoracic aorta at 111 HU's. Axial non-contrast 3 mm slices were carried out through the heart. The data set was analyzed on a dedicated work station and scored using the [place]. Gantry rotation speed was 250 msecs and collimation was .6 mm. 10 mg of iv Metoprolol and 0.8 mg of sl NTG  was given. The 3D data set was reconstructed in 5% intervals of the 67-82 % of the R-R cycle. Diastolic phases were analyzed on a dedicated work station using MPR, MIP and VRT modes. The patient received 80 cc of contrast. Aorta: Normal size. Mild diffuse calcifications including sinotubular junction. No dissection. Aortic Valve:  Trileaflet.  No calcifications. Coronary Arteries:  Normal coronary origin.  Right dominance. RCA is a medium size dominant artery that gives rise to acute marginal branch, PDA and PLVB. There is mild diffuse predominantly calcified plaque in the ostium, proximal, mid and distal portions with associated stenosis 25-50%. Left main is a short artery that gives rise to LAD and LCX arteries. There is eccentric calcified plaque with associated stenosis 25-50%. LAD is a large vessel that gives rise to two small diagonal branches. Ostial LAD has a severe mixed, predominantly calcified plaque with slit like appearance of the lumen with associated stenosis > 70%. The entire proximal to mid LAD has a severe long diffuse plaque with stenosis >70%. LCX is a non-dominant artery that gives rise to one large OM1 branch. Ostial and proximal LCX artery has moderate diffuse calcified plaque with associated stenosis 50-69% stenosis. Mid LCX at the bifurcation to OM1 has a severe plaque with associated stenosis > 70%. OM1 has an ostial mixed, predominantly non-calcified plaque with associated stenosis > 70%. Other findings: Normal  pulmonary vein drainage into the left atrium. Normal let atrial appendage without a thrombus. Mildly dilated pulmonary artery measuring 32 mm suggestive of pulmonary hypertension. IMPRESSION: 1. Coronary calcium score of 1348. This was 59 percentile for age and sex matched control. 2. Normal coronary origin with right dominance. 3. Severe obstructive 2 vessel disease in the ostial and proximal LAD, mid LCX artery and ostial OM1 vessels. Because of heavy diffuse calcifications additional analysis with CT FFR will be performed. Ena Dawley Electronically Signed   By: Ena Dawley   On: 05/24/2017 17:55   Result Date: 05/24/2017 EXAM: OVER-READ INTERPRETATION  CT CHEST The following report is an over-read performed by radiologist Dr. Collene Leyden [facility] [place] on 05/24/2017. This over-read does not include interpretation of cardiac or coronary anatomy or pathology. The coronary CTA interpretation by the cardiologist is attached. COMPARISON:  None. FINDINGS: Cardiovascular: Heart is borderline in size. Visualized aorta is normal caliber. Mediastinum/Nodes: No adenopathy in the lower mediastinum or hila. Lungs/Pleura: Visualized lungs are clear.  No effusions. Upper Abdomen: Imaging into the upper abdomen shows no acute findings. Musculoskeletal: Chest wall soft tissues are unremarkable. No acute bony abnormality. IMPRESSION: No acute or significant extracardiac abnormality. Electronically Signed: By: Rolm Baptise M.D. On: 05/24/2017 10:22   Ct Coronary Fractional Flow Reserve Fluid Analysis  Result Date: 05/24/2017 EXAM: FF/RCT ANALYSIS FINDINGS: FFRct analysis was performed on the original cardiac CT angiogram dataset. Diagrammatic representation of the FFRct analysis is provided in a separate PDF document in PACS. This dictation was created using the PDF document and an interactive 3D model of the results. 3D model is not available in the EMR/PACS. Normal FFR range is >0.80. 1. Left Main:   No significant stenosis. 2. LAD: Ostial LAD has severe stenosis with CT FFR after the lesion 0.69 to 0.61 in the distal LAD. 3. LCX: Mid LCX has severe stenosis with CT FFR 0.74 after the lesion. 4. OM1:  Severe ostial stenosis with CT FFR 0.71 after the lesion. 5. RCA: No significant stenosis. Mid RCA CT FFR: 0.87, distal RCA CT FFR: 0.81. IMPRESSION: 1. CT FFR  analysis showed severe obstructive 2 vessel disease in the ostial LAD and LCX and OM1 vessels. A cardiac catheterization is recommended. Electronically Signed   By: Ena Dawley   On: 05/24/2017 17:52    Echocardiogram  12/2014 - Left ventricle: The cavity size was normal. Systolic function was   normal. The estimated ejection fraction was in the range of 60%   to 65%. Wall motion was normal; there were no regional wall   motion abnormalities. Indeterminate left ventricular filling   pressure by Doppler parameters. - Aortic valve: There was very mild stenosis. Valve area (VTI):   1.67 cm^2. Valve area (Vmax): 1.49 cm^2. Valve area (Vmean): 1.29   cm^2. - Pulmonary arteries: PA peak pressure: 32 mm Hg (S).    ASSESSMENT AND PLAN 71 yo male w/ DMII, HTN, CKD, prior smoker and mild AS who underwent CT FFR, found to have severe 2 vessel disease, including ostial LAD.   # Severe 2 vessel disease: - start heparin gtt - LHC on 10/31   # HTN - continue home meds, except hold losartan   # DMII - continue home meds, except metformin in setting of CKD - ISS  # CKD: creatinine 1.9 (above baseline of 1.5) - gentle fluids as patient will be NPO  # Mild AS: evolemic  FULL CODE PPx: heparin gtt  Signed, Charlies Silvers, MD

## 2017-05-24 NOTE — ED Triage Notes (Signed)
Pt sent by Cardiology to be admitted and have stress test completed. Pt has been having SOB X several weeks, they scheduled CT Coronary today with showed "a critical ostial LAD lesion" Pt denies CP at this time

## 2017-05-25 ENCOUNTER — Observation Stay (HOSPITAL_BASED_OUTPATIENT_CLINIC_OR_DEPARTMENT_OTHER): Payer: Medicare Other

## 2017-05-25 ENCOUNTER — Encounter (HOSPITAL_COMMUNITY)
Admission: EM | Disposition: A | Discharge status: Home / Self Care | Payer: Self-pay | Source: Home / Self Care | Attending: Emergency Medicine

## 2017-05-25 ENCOUNTER — Observation Stay (HOSPITAL_BASED_OUTPATIENT_CLINIC_OR_DEPARTMENT_OTHER)
Admission: EM | Admit: 2017-05-25 | Discharge: 2017-05-26 | Disposition: A | Discharge status: Home / Self Care | Payer: Medicare Other | Source: Home / Self Care | Attending: Emergency Medicine | Admitting: Emergency Medicine

## 2017-05-25 DIAGNOSIS — I350 Nonrheumatic aortic (valve) stenosis: Secondary | ICD-10-CM | POA: Diagnosis not present

## 2017-05-25 DIAGNOSIS — I340 Nonrheumatic mitral (valve) insufficiency: Secondary | ICD-10-CM

## 2017-05-25 DIAGNOSIS — N183 Chronic kidney disease, stage 3 (moderate): Secondary | ICD-10-CM | POA: Diagnosis not present

## 2017-05-25 DIAGNOSIS — R079 Chest pain, unspecified: Secondary | ICD-10-CM | POA: Diagnosis not present

## 2017-05-25 DIAGNOSIS — I25110 Atherosclerotic heart disease of native coronary artery with unstable angina pectoris: Secondary | ICD-10-CM | POA: Diagnosis not present

## 2017-05-25 DIAGNOSIS — I1310 Hypertensive heart and chronic kidney disease without heart failure, with stage 1 through stage 4 chronic kidney disease, or unspecified chronic kidney disease: Secondary | ICD-10-CM | POA: Diagnosis not present

## 2017-05-25 DIAGNOSIS — N179 Acute kidney failure, unspecified: Secondary | ICD-10-CM | POA: Diagnosis not present

## 2017-05-25 DIAGNOSIS — I200 Unstable angina: Secondary | ICD-10-CM | POA: Diagnosis not present

## 2017-05-25 DIAGNOSIS — E1122 Type 2 diabetes mellitus with diabetic chronic kidney disease: Secondary | ICD-10-CM | POA: Diagnosis not present

## 2017-05-25 DIAGNOSIS — I5032 Chronic diastolic (congestive) heart failure: Secondary | ICD-10-CM | POA: Diagnosis not present

## 2017-05-25 DIAGNOSIS — R0609 Other forms of dyspnea: Secondary | ICD-10-CM | POA: Diagnosis not present

## 2017-05-25 DIAGNOSIS — I2510 Atherosclerotic heart disease of native coronary artery without angina pectoris: Secondary | ICD-10-CM | POA: Diagnosis present

## 2017-05-25 DIAGNOSIS — I25118 Atherosclerotic heart disease of native coronary artery with other forms of angina pectoris: Secondary | ICD-10-CM | POA: Diagnosis not present

## 2017-05-25 HISTORY — PX: LEFT HEART CATH AND CORONARY ANGIOGRAPHY: CATH118249

## 2017-05-25 LAB — ECHOCARDIOGRAM COMPLETE
AV Area VTI: 1.59 cm2
AV Mean grad: 12 mmHg
AV Peak grad: 22 mmHg
AV VEL mean LVOT/AV: 0.44
AV area mean vel ind: 0.82 cm2/m2
AV peak Index: 0.78
AV pk vel: 237 cm/s
AVAREAMEANV: 1.67 cm2
AVAREAVTIIND: 0.78 cm2/m2
Ao pk vel: 0.42 m/s
Ao-asc: 31 cm
CHL CUP AV VEL: 1.59
CHL CUP DOP CALC LVOT VTI: 23.6 cm
CHL CUP REG VEL DIAS: 92.3 cm/s
DOP CAL AO MEAN VELOCITY: 160 cm/s
EERAT: 15.63
EWDT: 250 ms
FS: 30 % (ref 28–44)
HEIGHTINCHES: 63 in
IV/PV OW: 0.94
LA ID, A-P, ES: 41 mm
LA diam end sys: 41 mm
LA vol A4C: 42.6 ml
LA vol index: 23.8 mL/m2
LA vol: 48.5 mL
LADIAMINDEX: 2.01 cm/m2
LV E/e' medial: 15.63
LV E/e'average: 15.63
LV TDI E'LATERAL: 6.24
LV TDI E'MEDIAL: 5.92
LV e' LATERAL: 6.24 cm/s
LVOT SV: 90 mL
LVOT area: 3.8 cm2
LVOT peak VTI: 0.42 cm
LVOTD: 22 mm
LVOTPV: 99.2 cm/s
MV Dec: 250
MV Peak grad: 4 mmHg
MV pk A vel: 125 m/s
MV pk E vel: 97.5 m/s
PW: 12 mm — AB (ref 0.6–1.1)
RV LATERAL S' VELOCITY: 9.81 cm/s
RV TAPSE: 26.7 mm
Reg peak vel: 264 cm/s
TRMAXVEL: 264 cm/s
VTI: 56.5 cm
Valve area index: 0.78
Valve area: 1.59 cm2
WEIGHTICAEL: 3179.2 [oz_av]

## 2017-05-25 LAB — BASIC METABOLIC PANEL
ANION GAP: 6 (ref 5–15)
Anion gap: 7 (ref 5–15)
BUN: 15 mg/dL (ref 6–20)
BUN: 14 mg/dL (ref 6–20)
CALCIUM: 8.6 mg/dL — AB (ref 8.9–10.3)
CHLORIDE: 105 mmol/L (ref 101–111)
CO2: 27 mmol/L (ref 22–32)
CO2: 29 mmol/L (ref 22–32)
CREATININE: 1.67 mg/dL — AB (ref 0.61–1.24)
CREATININE: 1.47 mg/dL — AB (ref 0.61–1.24)
Calcium: 8.6 mg/dL — ABNORMAL LOW (ref 8.9–10.3)
Chloride: 104 mmol/L (ref 101–111)
GFR calc non Af Amer: 40 mL/min — ABNORMAL LOW (ref >60)
GFR calc non Af Amer: 47 mL/min — ABNORMAL LOW (ref >60)
GFR, EST AFRICAN AMERICAN: 46 mL/min — AB (ref >60)
GFR, EST AFRICAN AMERICAN: 54 mL/min — AB (ref >60)
GLUCOSE: 205 mg/dL — AB (ref 65–99)
Glucose, Bld: 162 mg/dL — ABNORMAL HIGH (ref 65–99)
Potassium: 3.5 mmol/L (ref 3.5–5.1)
Potassium: 3.7 mmol/L (ref 3.5–5.1)
SODIUM: 140 mmol/L (ref 135–145)
Sodium: 138 mmol/L (ref 135–145)

## 2017-05-25 LAB — GLUCOSE, CAPILLARY
GLUCOSE-CAPILLARY: 172 mg/dL — AB (ref 65–99)
Glucose-Capillary: 196 mg/dL — ABNORMAL HIGH (ref 65–99)
Glucose-Capillary: 138 mg/dL — ABNORMAL HIGH (ref 65–99)
Glucose-Capillary: 193 mg/dL — ABNORMAL HIGH (ref 65–99)

## 2017-05-25 LAB — PROTIME-INR
INR: 0.98
Prothrombin Time: 12.9 seconds (ref 11.4–15.2)

## 2017-05-25 LAB — HEPARIN LEVEL (UNFRACTIONATED): Heparin Unfractionated: 0.29 IU/mL — ABNORMAL LOW (ref 0.30–0.70)

## 2017-05-25 SURGERY — LEFT HEART CATH AND CORONARY ANGIOGRAPHY
Anesthesia: LOCAL

## 2017-05-25 MED ORDER — GLIPIZIDE 2.5 MG HALF TABLET: 2.5000 mg | ORAL_TABLET | Freq: Two times a day (BID) | ORAL | Status: DC

## 2017-05-25 MED ORDER — INSULIN ASPART 100 UNIT/ML ~~LOC~~ SOLN
0.0000 [IU] | Freq: Every day | SUBCUTANEOUS | Status: DC
Start: 2017-05-25 — End: 2017-05-26

## 2017-05-25 MED ORDER — SODIUM CHLORIDE 0.9% FLUSH: 3.0000 mL | INTRAVENOUS | Status: DC | PRN

## 2017-05-25 MED ORDER — FENTANYL CITRATE (PF) 100 MCG/2ML IJ SOLN
INTRAMUSCULAR | Status: DC | PRN
  Administered 2017-05-25: 50 ug via INTRAVENOUS

## 2017-05-25 MED ORDER — SODIUM CHLORIDE 0.9 % IV SOLN
INTRAVENOUS | Status: DC
  Administered 2017-05-25: 04:00:00 via INTRAVENOUS

## 2017-05-25 MED ORDER — COLCHICINE 0.6 MG PO TABS: 0.6000 mg | ORAL_TABLET | Freq: Every day | ORAL | Status: DC | PRN

## 2017-05-25 MED ORDER — LIDOCAINE HCL 2 % IJ SOLN
INTRAMUSCULAR | Status: DC | PRN
  Administered 2017-05-25: 2 mL

## 2017-05-25 MED ORDER — TERBINAFINE HCL 250 MG PO TABS: 250.0000 mg | ORAL_TABLET | Freq: Every day | ORAL | Status: DC

## 2017-05-25 MED ORDER — CANAGLIFLOZIN 100 MG PO TABS: 100.0000 mg | ORAL_TABLET | Freq: Every day | ORAL | Status: DC

## 2017-05-25 MED ORDER — HEPARIN (PORCINE) IN NACL 100-0.45 UNIT/ML-% IJ SOLN
1150.0000 [IU]/h | INTRAMUSCULAR | Status: DC
  Administered 2017-05-26: 1150 [IU]/h via INTRAVENOUS
  Filled 2017-05-25: qty 250

## 2017-05-25 MED ORDER — PRAVASTATIN SODIUM 10 MG PO TABS: 10.0000 mg | ORAL_TABLET | Freq: Every day | ORAL | Status: DC

## 2017-05-25 MED ORDER — ISOSORBIDE MONONITRATE ER 30 MG PO TB24
30.0000 mg | ORAL_TABLET | Freq: Every day | ORAL | Status: DC
  Administered 2017-05-25 – 2017-05-26 (×2): 30 mg via ORAL
  Filled 2017-05-25 (×2): qty 1

## 2017-05-25 MED ORDER — HEPARIN (PORCINE) IN NACL 100-0.45 UNIT/ML-% IJ SOLN
1150.0000 [IU]/h | INTRAMUSCULAR | Status: DC
  Administered 2017-05-25: 1100 [IU]/h via INTRAVENOUS
  Filled 2017-05-25: qty 250

## 2017-05-25 MED ORDER — GLIPIZIDE 10 MG PO TABS: 10.0000 mg | ORAL_TABLET | Freq: Two times a day (BID) | ORAL | Status: DC

## 2017-05-25 MED ORDER — AMLODIPINE BESYLATE 10 MG PO TABS
10.0000 mg | ORAL_TABLET | Freq: Every day | ORAL | Status: DC
  Administered 2017-05-25 – 2017-05-26 (×2): 10 mg via ORAL
  Filled 2017-05-25: qty 1
  Filled 2017-05-25 (×2): qty 2

## 2017-05-25 MED ORDER — ASPIRIN EC 81 MG PO TBEC
81.0000 mg | DELAYED_RELEASE_TABLET | Freq: Every day | ORAL | Status: DC
  Administered 2017-05-25 – 2017-05-26 (×2): 81 mg via ORAL
  Filled 2017-05-25 (×2): qty 1

## 2017-05-25 MED ORDER — HEPARIN SODIUM (PORCINE) 1000 UNIT/ML IJ SOLN
INTRAMUSCULAR | Status: DC | PRN
  Administered 2017-05-25: 4500 [IU] via INTRAVENOUS

## 2017-05-25 MED ORDER — MIDAZOLAM HCL 2 MG/2ML IJ SOLN
INTRAMUSCULAR | Status: AC
  Filled 2017-05-25: qty 2

## 2017-05-25 MED ORDER — METOPROLOL TARTRATE 50 MG PO TABS
50.0000 mg | ORAL_TABLET | Freq: Two times a day (BID) | ORAL | Status: DC
Start: 2017-05-25 — End: 2017-05-26
  Administered 2017-05-25 – 2017-05-26 (×4): 50 mg via ORAL
  Filled 2017-05-25: qty 2
  Filled 2017-05-25 (×2): qty 1
  Filled 2017-05-25: qty 2

## 2017-05-25 MED ORDER — FENTANYL CITRATE (PF) 100 MCG/2ML IJ SOLN
INTRAMUSCULAR | Status: AC
  Filled 2017-05-25: qty 2

## 2017-05-25 MED ORDER — HEPARIN SODIUM (PORCINE) 1000 UNIT/ML IJ SOLN
INTRAMUSCULAR | Status: AC
  Filled 2017-05-25: qty 1

## 2017-05-25 MED ORDER — VERAPAMIL HCL 2.5 MG/ML IV SOLN
INTRAVENOUS | Status: DC | PRN
  Administered 2017-05-25: 10 mL via INTRA_ARTERIAL

## 2017-05-25 MED ORDER — INSULIN ASPART 100 UNIT/ML ~~LOC~~ SOLN
0.0000 [IU] | Freq: Three times a day (TID) | SUBCUTANEOUS | Status: DC
  Administered 2017-05-25: 3 [IU] via SUBCUTANEOUS
  Administered 2017-05-25: 2 [IU] via SUBCUTANEOUS
  Administered 2017-05-25 – 2017-05-26 (×2): 3 [IU] via SUBCUTANEOUS

## 2017-05-25 MED ORDER — SODIUM CHLORIDE 0.9 % WEIGHT BASED INFUSION
3.0000 mL/kg/h | INTRAVENOUS | Status: DC
  Administered 2017-05-25: 3 mL/kg/h via INTRAVENOUS

## 2017-05-25 MED ORDER — IOPAMIDOL (ISOVUE-370) INJECTION 76%
INTRAVENOUS | Status: DC | PRN
  Administered 2017-05-25: 50 mL

## 2017-05-25 MED ORDER — IOPAMIDOL (ISOVUE-370) INJECTION 76%
INTRAVENOUS | Status: AC
  Filled 2017-05-25: qty 100

## 2017-05-25 MED ORDER — MIDAZOLAM HCL 2 MG/2ML IJ SOLN
INTRAMUSCULAR | Status: DC | PRN
  Administered 2017-05-25: 1 mg via INTRAVENOUS

## 2017-05-25 MED ORDER — SODIUM CHLORIDE 0.9% FLUSH
3.0000 mL | Freq: Two times a day (BID) | INTRAVENOUS | Status: DC
  Administered 2017-05-25 – 2017-05-26 (×2): 3 mL via INTRAVENOUS

## 2017-05-25 MED ORDER — VERAPAMIL HCL 2.5 MG/ML IV SOLN
INTRAVENOUS | Status: AC
  Filled 2017-05-25: qty 2

## 2017-05-25 MED ORDER — HEPARIN (PORCINE) IN NACL 2-0.9 UNIT/ML-% IJ SOLN
INTRAMUSCULAR | Status: AC | PRN
  Administered 2017-05-25: 1000 mL

## 2017-05-25 MED ORDER — HEPARIN (PORCINE) IN NACL 2-0.9 UNIT/ML-% IJ SOLN
INTRAMUSCULAR | Status: AC
  Filled 2017-05-25: qty 1000

## 2017-05-25 MED ORDER — SODIUM CHLORIDE 0.9 % IV SOLN: 250.0000 mL | INTRAVENOUS | Status: DC | PRN

## 2017-05-25 MED ORDER — LIDOCAINE HCL 2 % IJ SOLN
INTRAMUSCULAR | Status: AC
  Filled 2017-05-25: qty 20

## 2017-05-25 MED ORDER — SODIUM CHLORIDE 0.9% FLUSH: 3.0000 mL | Freq: Two times a day (BID) | INTRAVENOUS | Status: DC

## 2017-05-25 MED ORDER — ATORVASTATIN CALCIUM 80 MG PO TABS
80.0000 mg | ORAL_TABLET | Freq: Every day | ORAL | Status: DC
  Administered 2017-05-25: 80 mg via ORAL
  Filled 2017-05-25: qty 1

## 2017-05-25 MED ORDER — SODIUM CHLORIDE 0.9 % IV SOLN: INTRAVENOUS | Status: AC

## 2017-05-25 MED ORDER — SODIUM CHLORIDE 0.9 % WEIGHT BASED INFUSION: 1.0000 mL/kg/h | INTRAVENOUS | Status: DC

## 2017-05-25 SURGICAL SUPPLY — 10 items
CATH INFINITI 5 FR JL3.5 (CATHETERS) ×2 IMPLANT
CATH OPTITORQUE JACKY 4.0 5F (CATHETERS) ×2 IMPLANT
DEVICE RAD COMP TR BAND LRG (VASCULAR PRODUCTS) ×2 IMPLANT
GLIDESHEATH SLEND SS 6F .021 (SHEATH) ×2 IMPLANT
GUIDEWIRE INQWIRE 1.5J.035X260 (WIRE) ×1 IMPLANT
INQWIRE 1.5J .035X260CM (WIRE) ×2
KIT HEART LEFT (KITS) ×2 IMPLANT
PACK CARDIAC CATHETERIZATION (CUSTOM PROCEDURE TRAY) ×2 IMPLANT
TRANSDUCER W/STOPCOCK (MISCELLANEOUS) ×2 IMPLANT
TUBING CIL FLEX 10 FLL-RA (TUBING) ×2 IMPLANT

## 2017-05-25 NOTE — Progress Notes (Signed)
  Echocardiogram 2D Echocardiogram has been performed.  Antonio Wells G Kasumi Ditullio 05/25/2017, 3:50 PM

## 2017-05-25 NOTE — Progress Notes (Signed)
ANTICOAGULATION CONSULT NOTE - Initial Consult  Pharmacy Consult for Heparin Indication: chest pain/ACS  Allergies  Allergen Reactions  . Penicillins Other (See Comments)    Passed out  . Tramadol Other (See Comments)    Hiccups and vomiting  . Cortisone     Hiccups and vomiting  . Lisinopril Cough    Patient Measurements: Height: 5\' 3"  (160 cm) Weight: 198 lb 11.2 oz (90.1 kg) IBW/kg (Calculated) : 56.9 Heparin Dosing Weight: 80 kg  Vital Signs: Temp: 97.8 F (36.6 C) (10/31 0338) Temp Source: Oral (10/31 0338) BP: 159/72 (10/31 0338) Pulse Rate: 78 (10/31 0338)  Labs:  Recent Labs  05/24/17 2228  HGB 13.2  HCT 38.2*  PLT 216  CREATININE 1.99*    Estimated Creatinine Clearance: 34.3 mL/min (A) (by C-G formula based on SCr of 1.99 mg/dL (H)).   Medical History: Past Medical History:  Diagnosis Date  . Diabetes mellitus without complication ([place])   . Gout   . Heart murmur   . High cholesterol   . Hypertension   . Sleep apnea     Medications:  Prescriptions Prior to Admission  Medication Sig Dispense Refill Last Dose  . amLODipine (NORVASC) 10 MG tablet Take 1 tablet (10 mg total) by mouth daily. 90 tablet 1 05/23/2017  . aspirin EC 81 MG tablet Take 1 tablet (81 mg total) by mouth daily. 30 tablet 0 05/23/2017 at 0900  . colchicine 0.6 MG tablet Take 1-2 tablets (0.6-1.2 mg total) by mouth daily as needed (for gout flare). 2 po at onset of flair and then repeat in 1 h for acute gout attack then 1 po qd until pain free 30 tablet 0 unk  . furosemide (LASIX) 40 MG tablet Take 1 tablet (40 mg total) by mouth daily. 90 tablet 2 05/23/2017  . glipiZIDE (GLUCOTROL) 10 MG tablet TAKE 1 TABLET BY MOUTH TWICE A DAY BEFORE MEALS. 180 tablet 1 05/23/2017  . isosorbide mononitrate (IMDUR) 30 MG 24 hr tablet Take 1 tablet (30 mg total) by mouth daily. 90 tablet 1 05/23/2017  . losartan (COZAAR) 100 MG tablet TAKE 1 TABLET (100 MG TOTAL) BY MOUTH DAILY. 90 tablet 1 Past  Month at Unknown time  . lovastatin (MEVACOR) 20 MG tablet Take 1 tablet (20 mg total) by mouth at bedtime. 90 tablet 1 05/23/2017  . metFORMIN (GLUCOPHAGE) 500 MG tablet TAKE 1 TABLET (500 MG TOTAL) BY MOUTH 2 (TWO) TIMES DAILY WITH A MEAL. 180 tablet 1 05/23/2017  . metoprolol tartrate (LOPRESSOR) 50 MG tablet Take 1 tablet (50 mg total) by mouth 2 (two) times daily. 180 tablet 1 05/23/2017 at 2000  . canagliflozin (INVOKANA) 100 MG TABS tablet Take 1 tablet (100 mg total) by mouth daily before breakfast. (Patient not taking: Reported on 05/25/2017) 30 tablet 2 Not Taking at Unknown time    Assessment: 71 y.o. male with CAD on CTA, awaiting cath today, for heparin Goal of Therapy:  Heparin level 0.3-0.7 units/ml Monitor platelets by anticoagulation protocol: Yes   Plan:  Start heparin 1100 units/hr Check heparin level in 8 hours.   Ghazal Pevey, Bronson Curb 05/25/2017,3:50 AM

## 2017-05-25 NOTE — Interval H&P Note (Signed)
Cath Lab Visit (complete for each Cath Lab visit)  Clinical Evaluation Leading to the Procedure:   ACS: No.  Non-ACS:    Anginal Classification: CCS III  Anti-ischemic medical therapy: Maximal Therapy (2 or more classes of medications)  Non-Invasive Test Results: No non-invasive testing performed  Prior CABG: No previous CABG      History and Physical Interval Note:  05/25/2017 4:04 PM  Antonio Wells  has presented today for surgery, with the diagnosis of unstable angina  The various methods of treatment have been discussed with the patient and family. After consideration of risks, benefits and other options for treatment, the patient has consented to  Procedure(s): LEFT HEART CATH AND CORONARY ANGIOGRAPHY (N/A) as a surgical intervention .  The patient's history has been reviewed, patient examined, no change in status, stable for surgery.  I have reviewed the patient's chart and labs.  Questions were answered to the patient's satisfaction.     Kathlyn [place]

## 2017-05-25 NOTE — Progress Notes (Signed)
ANTICOAGULATION CONSULT NOTE - Initial Consult  Pharmacy Consult for Heparin Indication: chest pain/ACS  Allergies  Allergen Reactions  . Penicillins Other (See Comments)    Passed out  . Tramadol Other (See Comments)    Hiccups and vomiting  . Cortisone     Hiccups and vomiting  . Lisinopril Cough    Patient Measurements: Height: 5\' 3"  (160 cm) Weight: 198 lb 11.2 oz (90.1 kg) IBW/kg (Calculated) : 56.9 Heparin Dosing Weight: 80 kg  Vital Signs: Temp: 97.8 F (36.6 C) (10/31 0338) Temp Source: Oral (10/31 0338) BP: 146/77 (10/31 0851) Pulse Rate: 68 (10/31 0852)  Labs:  Recent Labs  05/24/17 2228 05/25/17 0749 05/25/17 1118  HGB 13.2  --   --   HCT 38.2*  --   --   PLT 216  --   --   LABPROT  --   --  12.9  INR  --   --  0.98  HEPARINUNFRC  --   --  0.29*  CREATININE 1.99* 1.67*  --     Estimated Creatinine Clearance: 40.9 mL/min (A) (by C-G formula based on SCr of 1.67 mg/dL (H)).   Medical History: Past Medical History:  Diagnosis Date  . Diabetes mellitus without complication ([place])   . Gout   . Heart murmur   . High cholesterol   . Hypertension   . Sleep apnea     Medications:  Prescriptions Prior to Admission  Medication Sig Dispense Refill Last Dose  . amLODipine (NORVASC) 10 MG tablet Take 1 tablet (10 mg total) by mouth daily. 90 tablet 1 05/23/2017  . aspirin EC 81 MG tablet Take 1 tablet (81 mg total) by mouth daily. 30 tablet 0 05/23/2017 at 0900  . colchicine 0.6 MG tablet Take 1-2 tablets (0.6-1.2 mg total) by mouth daily as needed (for gout flare). 2 po at onset of flair and then repeat in 1 h for acute gout attack then 1 po qd until pain free 30 tablet 0 unk  . furosemide (LASIX) 40 MG tablet Take 1 tablet (40 mg total) by mouth daily. 90 tablet 2 05/23/2017  . glipiZIDE (GLUCOTROL) 10 MG tablet TAKE 1 TABLET BY MOUTH TWICE A DAY BEFORE MEALS. 180 tablet 1 05/23/2017  . isosorbide mononitrate (IMDUR) 30 MG 24 hr tablet Take 1 tablet  (30 mg total) by mouth daily. 90 tablet 1 05/23/2017  . losartan (COZAAR) 100 MG tablet TAKE 1 TABLET (100 MG TOTAL) BY MOUTH DAILY. 90 tablet 1 Past Month at Unknown time  . lovastatin (MEVACOR) 20 MG tablet Take 1 tablet (20 mg total) by mouth at bedtime. 90 tablet 1 05/23/2017  . metFORMIN (GLUCOPHAGE) 500 MG tablet TAKE 1 TABLET (500 MG TOTAL) BY MOUTH 2 (TWO) TIMES DAILY WITH A MEAL. 180 tablet 1 05/23/2017  . metoprolol tartrate (LOPRESSOR) 50 MG tablet Take 1 tablet (50 mg total) by mouth 2 (two) times daily. 180 tablet 1 05/23/2017 at 2000  . canagliflozin (INVOKANA) 100 MG TABS tablet Take 1 tablet (100 mg total) by mouth daily before breakfast. (Patient not taking: Reported on 05/25/2017) 30 tablet 2 Not Taking at Unknown time    Assessment: 71 y.o. male with CAD on CTA, awaiting cath today, for heparin.   Heparin level came back today at 0.29, slightly below goal range, on 110 units/hr. CBC was within normal limits at admission. No signs/symptoms of bleeding. No infusion issues per nursing. Plan for cath today.  Goal of Therapy:  Heparin level 0.3-0.7 units/ml  Monitor platelets by anticoagulation protocol: Yes   Plan:  Increase heparin to 1150 units/hr Monitor daily HL, CBC, and s/sx of bleeding Follow up on anticoagulation plan post-cath  Doylene Canard, PharmD Clinical Pharmacist  Pager: [phone] Clinical Phone for 05/25/2017 until 3:30pm: [phone] If after 3:30pm, please call main pharmacy at [phone]

## 2017-05-25 NOTE — H&P (View-Only) (Signed)
ANTICOAGULATION CONSULT NOTE - Initial Consult  Pharmacy Consult for Heparin Indication: chest pain/ACS  Allergies  Allergen Reactions  . Penicillins Other (See Comments)    Passed out  . Tramadol Other (See Comments)    Hiccups and vomiting  . Cortisone     Hiccups and vomiting  . Lisinopril Cough    Patient Measurements: Height: 5\' 3"  (160 cm) Weight: 198 lb 11.2 oz (90.1 kg) IBW/kg (Calculated) : 56.9 Heparin Dosing Weight: 80 kg  Vital Signs: Temp: 97.8 F (36.6 C) (10/31 0338) Temp Source: Oral (10/31 0338) BP: 159/72 (10/31 0338) Pulse Rate: 78 (10/31 0338)  Labs:  Recent Labs  05/24/17 2228  HGB 13.2  HCT 38.2*  PLT 216  CREATININE 1.99*    Estimated Creatinine Clearance: 34.3 mL/min (A) (by C-G formula based on SCr of 1.99 mg/dL (H)).   Medical History: Past Medical History:  Diagnosis Date  . Diabetes mellitus without complication ([place])   . Gout   . Heart murmur   . High cholesterol   . Hypertension   . Sleep apnea     Medications:  Prescriptions Prior to Admission  Medication Sig Dispense Refill Last Dose  . amLODipine (NORVASC) 10 MG tablet Take 1 tablet (10 mg total) by mouth daily. 90 tablet 1 05/23/2017  . aspirin EC 81 MG tablet Take 1 tablet (81 mg total) by mouth daily. 30 tablet 0 05/23/2017 at 0900  . colchicine 0.6 MG tablet Take 1-2 tablets (0.6-1.2 mg total) by mouth daily as needed (for gout flare). 2 po at onset of flair and then repeat in 1 h for acute gout attack then 1 po qd until pain free 30 tablet 0 unk  . furosemide (LASIX) 40 MG tablet Take 1 tablet (40 mg total) by mouth daily. 90 tablet 2 05/23/2017  . glipiZIDE (GLUCOTROL) 10 MG tablet TAKE 1 TABLET BY MOUTH TWICE A DAY BEFORE MEALS. 180 tablet 1 05/23/2017  . isosorbide mononitrate (IMDUR) 30 MG 24 hr tablet Take 1 tablet (30 mg total) by mouth daily. 90 tablet 1 05/23/2017  . losartan (COZAAR) 100 MG tablet TAKE 1 TABLET (100 MG TOTAL) BY MOUTH DAILY. 90 tablet 1 Past  Month at Unknown time  . lovastatin (MEVACOR) 20 MG tablet Take 1 tablet (20 mg total) by mouth at bedtime. 90 tablet 1 05/23/2017  . metFORMIN (GLUCOPHAGE) 500 MG tablet TAKE 1 TABLET (500 MG TOTAL) BY MOUTH 2 (TWO) TIMES DAILY WITH A MEAL. 180 tablet 1 05/23/2017  . metoprolol tartrate (LOPRESSOR) 50 MG tablet Take 1 tablet (50 mg total) by mouth 2 (two) times daily. 180 tablet 1 05/23/2017 at 2000  . canagliflozin (INVOKANA) 100 MG TABS tablet Take 1 tablet (100 mg total) by mouth daily before breakfast. (Patient not taking: Reported on 05/25/2017) 30 tablet 2 Not Taking at Unknown time    Assessment: 71 y.o. male with CAD on CTA, awaiting cath today, for heparin Goal of Therapy:  Heparin level 0.3-0.7 units/ml Monitor platelets by anticoagulation protocol: Yes   Plan:  Start heparin 1100 units/hr Check heparin level in 8 hours.   Kerston Landeck, Bronson Curb 05/25/2017,3:50 AM

## 2017-05-25 NOTE — ED Notes (Signed)
Attempted report x1. RN unavailable @ this time. Callback # left. 

## 2017-05-25 NOTE — ED Provider Notes (Signed)
[facility]ERGENCY DEPARTMENT Provider Note   CSN: [id number] Arrival date & time: 05/24/17  2219     History   Chief Complaint Chief Complaint  Patient presents with  . Sent by PCP    HPI Antonio Wells is a 71 y.o. male.  The history is provided by the patient, the spouse and a relative.  Shortness of Breath  This is a new problem. The problem occurs frequently.The problem has been resolved. Pertinent negatives include no fever, no chest pain, no syncope and no vomiting.  patient with h/o diabetes, HTN, HLP presents with recent increase in episodes of dyspnea and dyspnea on exertion No CP He is now improved at rest Denies fever/vomiting No abdominal pain He feels well at this time He was sent in by cardiology for admission   Past Medical History:  Diagnosis Date  . Diabetes mellitus without complication ([place])   . Gout   . Heart murmur   . High cholesterol   . Hypertension   . Sleep apnea     Patient Active Problem List   Diagnosis Date Noted  . CAD (coronary artery disease) 05/25/2017  . Hypertensive heart disease 07/14/2015  . Lower extremity edema 07/14/2015  . Aortic stenosis 01/09/2015  . OSA on CPAP 12/12/2014  . Hypersomnia with sleep apnea 12/12/2014  . Obstructive sleep apnea (adult) (pediatric) 02/04/2014  . 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 Obesity 02/04/2014  . Mitral regurgitation and aortic stenosis 01/02/2014  . DOE (dyspnea on exertion) 11/26/2013  . Gout 08/12/2011  . Hypertension 08/12/2011  . High cholesterol 08/12/2011  . OSA (obstructive sleep apnea) 08/12/2011  . Diabetes type 2, uncontrolled ([place]) 08/12/2011    Past Surgical History:  Procedure Laterality Date  . COLONOSCOPY  2008       Home Medications    Prior to Admission medications   Medication Sig Start Date End Date Taking? Authorizing Provider  amLODipine (NORVASC) 10 MG tablet Take 1 tablet (10 mg total) by mouth daily. 05/12/17  Yes  [name], MD  aspirin EC 81 MG tablet Take 1 tablet (81 mg total) by mouth daily. 05/31/13  Yes Harvie Heck, PA-C  colchicine 0.6 MG tablet Take 1-2 tablets (0.6-1.2 mg total) by mouth daily as needed (for gout flare). 2 po at onset of flair and then repeat in 1 h for acute gout attack then 1 po qd until pain free 05/31/13  Yes Harvie Heck, PA-C  furosemide (LASIX) 40 MG tablet Take 1 tablet (40 mg total) by mouth daily. 04/27/17  Yes [name], MD  glipiZIDE (GLUCOTROL) 10 MG tablet TAKE 1 TABLET BY MOUTH TWICE A DAY BEFORE MEALS. 05/12/17  Yes [name], MD  isosorbide mononitrate (IMDUR) 30 MG 24 hr tablet Take 1 tablet (30 mg total) by mouth daily. 05/12/17  Yes [name], MD  losartan (COZAAR) 100 MG tablet TAKE 1 TABLET (100 MG TOTAL) BY MOUTH DAILY. 05/12/17  Yes [name], MD  lovastatin (MEVACOR) 20 MG tablet Take 1 tablet (20 mg total) by mouth at bedtime. 05/12/17  Yes [name], MD  metFORMIN (GLUCOPHAGE) 500 MG tablet TAKE 1 TABLET (500 MG TOTAL) BY MOUTH 2 (TWO) TIMES DAILY WITH A MEAL. 05/12/17  Yes [name], MD  metoprolol tartrate (LOPRESSOR) 50 MG tablet Take 1 tablet (50 mg total) by mouth 2 (two) times daily. 05/12/17  Yes [name], MD  canagliflozin (INVOKANA) 100 MG TABS tablet Take 1  tablet (100 mg total) by mouth daily before breakfast. Patient not taking: Reported on 05/25/2017 05/12/17   [name], MD    Family History Family History  Problem Relation Age of Onset  . Hypertension Mother   . Hypertension Father   . Cancer Brother        lung  . Stroke Brother   . Colon cancer Neg Hx     Social History Social History  Substance Use Topics  . Smoking status: Former Smoker    Packs/day: 2.00    Years: 15.00    Types: Cigarettes    Quit date: 07/26/1998  . Smokeless tobacco: Never Used  . Alcohol use No     Allergies   Penicillins; Tramadol; Cortisone; and Lisinopril   Review of  Systems Review of Systems  Constitutional: Negative for fever.  Respiratory: Positive for shortness of breath.   Cardiovascular: Negative for chest pain and syncope.  Gastrointestinal: Negative for vomiting.  Neurological: Negative for syncope.  All other systems reviewed and are negative.    Physical Exam Updated Vital Signs BP (!) 147/81   Pulse 67   Temp 99 F (37.2 C) (Oral)   Resp 13   Ht 1.626 m (5\' 4" )   Wt 90.3 kg (199 lb)   SpO2 97%   BMI 34.16 kg/m   Physical Exam CONSTITUTIONAL: Well developed/well nourished, appears younger than stated age HEAD: Normocephalic/atraumatic EYES: EOMI/PERRL ENMT: Mucous membranes moist NECK: supple no meningeal signs SPINE/BACK:entire spine nontender CV: S1/S2 noted, no murmurs/rubs/gallops noted LUNGS: Lungs are clear to auscultation bilaterally, no apparent distress ABDOMEN: soft, nontender NEURO: Pt is awake/alert/appropriate, moves all extremitiesx4.  No facial droop.   EXTREMITIES: pulses normal/equal, full ROM, no calf tenderness noted SKIN: warm, color normal PSYCH: no abnormalities of mood noted, alert and oriented to situation   ED Treatments / Results  Labs (all labs ordered are listed, but only abnormal results are displayed) Labs Reviewed  BASIC METABOLIC PANEL - Abnormal; Notable for the following:       Result Value   Glucose, Bld 216 (*)    Creatinine, Ser 1.99 (*)    GFR calc non Af Amer 32 (*)    GFR calc Af Amer 37 (*)    All other components within normal limits  CBC - Abnormal; Notable for the following:    HCT 38.2 (*)    All other components within normal limits  I-STAT TROPONIN, ED    EKG ED ECG REPORT   Date: 05/24/2017  Rate: 81  Rhythm: normal sinus rhythm  QRS Axis: normal  Intervals: normal  ST/T Wave abnormalities: nonspecific ST changes  Conduction Disutrbances:LVH noted  I have personally reviewed the EKG tracing and agree with the computerized printout as  noted.  Radiology Ct Coronary Morph W/cta Cor W/score W/ca W/cm &/or Wo/cm  Addendum Date: 05/24/2017   ADDENDUM REPORT: 05/24/2017 17:55 CLINICAL DATA:  71 year old male with h/o smoking, obesity, insulin-dependent diabetes mellitus, hyperlipidemia, uncontrolled hypertension, OSA, with DOE and negative stress test. EXAM: Cardiac/Coronary  CT TECHNIQUE: The patient was scanned on a [employer]. FINDINGS: A 120 kV prospective scan was triggered in the descending thoracic aorta at 111 HU's. Axial non-contrast 3 mm slices were carried out through the heart. The data set was analyzed on a dedicated work station and scored using the [place]. Gantry rotation speed was 250 msecs and collimation was .6 mm. 10 mg of iv Metoprolol and 0.8 mg of sl NTG was given. The  3D data set was reconstructed in 5% intervals of the 67-82 % of the R-R cycle. Diastolic phases were analyzed on a dedicated work station using MPR, MIP and VRT modes. The patient received 80 cc of contrast. Aorta: Normal size. Mild diffuse calcifications including sinotubular junction. No dissection. Aortic Valve:  Trileaflet.  No calcifications. Coronary Arteries:  Normal coronary origin.  Right dominance. RCA is a medium size dominant artery that gives rise to acute marginal branch, PDA and PLVB. There is mild diffuse predominantly calcified plaque in the ostium, proximal, mid and distal portions with associated stenosis 25-50%. Left main is a short artery that gives rise to LAD and LCX arteries. There is eccentric calcified plaque with associated stenosis 25-50%. LAD is a large vessel that gives rise to two small diagonal branches. Ostial LAD has a severe mixed, predominantly calcified plaque with slit like appearance of the lumen with associated stenosis > 70%. The entire proximal to mid LAD has a severe long diffuse plaque with stenosis >70%. LCX is a non-dominant artery that gives rise to one large OM1 branch. Ostial and proximal LCX  artery has moderate diffuse calcified plaque with associated stenosis 50-69% stenosis. Mid LCX at the bifurcation to OM1 has a severe plaque with associated stenosis > 70%. OM1 has an ostial mixed, predominantly non-calcified plaque with associated stenosis > 70%. Other findings: Normal pulmonary vein drainage into the left atrium. Normal let atrial appendage without a thrombus. Mildly dilated pulmonary artery measuring 32 mm suggestive of pulmonary hypertension. IMPRESSION: 1. Coronary calcium score of 1348. This was 43 percentile for age and sex matched control. 2. Normal coronary origin with right dominance. 3. Severe obstructive 2 vessel disease in the ostial and proximal LAD, mid LCX artery and ostial OM1 vessels. Because of heavy diffuse calcifications additional analysis with CT FFR will be performed. Ena Dawley Electronically Signed   By: Ena Dawley   On: 05/24/2017 17:55   Result Date: 05/24/2017 EXAM: OVER-READ INTERPRETATION  CT CHEST The following report is an over-read performed by radiologist Dr. Collene Leyden [facility], [place] on 05/24/2017. This over-read does not include interpretation of cardiac or coronary anatomy or pathology. The coronary CTA interpretation by the cardiologist is attached. COMPARISON:  None. FINDINGS: Cardiovascular: Heart is borderline in size. Visualized aorta is normal caliber. Mediastinum/Nodes: No adenopathy in the lower mediastinum or hila. Lungs/Pleura: Visualized lungs are clear.  No effusions. Upper Abdomen: Imaging into the upper abdomen shows no acute findings. Musculoskeletal: Chest wall soft tissues are unremarkable. No acute bony abnormality. IMPRESSION: No acute or significant extracardiac abnormality. Electronically Signed: By: Rolm Baptise M.D. On: 05/24/2017 10:22   Ct Coronary Fractional Flow Reserve Fluid Analysis  Result Date: 05/24/2017 EXAM: FF/RCT ANALYSIS FINDINGS: FFRct analysis was performed on the original cardiac CT  angiogram dataset. Diagrammatic representation of the FFRct analysis is provided in a separate PDF document in PACS. This dictation was created using the PDF document and an interactive 3D model of the results. 3D model is not available in the EMR/PACS. Normal FFR range is >0.80. 1. Left Main:  No significant stenosis. 2. LAD: Ostial LAD has severe stenosis with CT FFR after the lesion 0.69 to 0.61 in the distal LAD. 3. LCX: Mid LCX has severe stenosis with CT FFR 0.74 after the lesion. 4. OM1:  Severe ostial stenosis with CT FFR 0.71 after the lesion. 5. RCA: No significant stenosis. Mid RCA CT FFR: 0.87, distal RCA CT FFR: 0.81. IMPRESSION: 1. CT FFR analysis showed severe  obstructive 2 vessel disease in the ostial LAD and LCX and OM1 vessels. A cardiac catheterization is recommended. Electronically Signed   By: Ena Dawley   On: 05/24/2017 17:52    Procedures Procedures (including critical care time)  Medications Ordered in ED Medications - No data to display   Initial Impression / Assessment and Plan / ED Course  I have reviewed the triage vital signs and the nursing notes.  Pertinent labs & imaging results that were available during my care of the patient were reviewed by me and considered in my medical decision making (see chart for details).     Pt in the ED per cardiology request due to abnormal CT coronary that revealed 2 vessel CAD Currently asymptomatic but symptoms usually triggered with ambulating D/w cardiology for admission   Final Clinical Impressions(s) / ED Diagnoses   Final diagnoses:  Unstable angina [facility])    New Prescriptions New Prescriptions   No medications on file     Ripley Fraise, MD 05/25/17 [phone]

## 2017-05-25 NOTE — Progress Notes (Signed)
Progress Note  Patient Name: Antonio Wells Date of Encounter: 05/25/2017  Primary Cardiologist: Meda Coffee  Subjective   Feeling well this morning. No chest pain.   Inpatient Medications    Scheduled Meds: . amLODipine  10 mg Oral Daily  . aspirin EC  81 mg Oral Daily  . [START ON 05/26/2017] canagliflozin  100 mg Oral QAC breakfast  . glipiZIDE  2.5 mg Oral BID AC  . insulin aspart  0-15 Units Subcutaneous TID WC  . insulin aspart  0-5 Units Subcutaneous QHS  . isosorbide mononitrate  30 mg Oral Daily  . metoprolol tartrate  50 mg Oral BID  . pravastatin  10 mg Oral q1800   Continuous Infusions: . sodium chloride 10 mL/hr at 05/25/17 0400  . heparin 1,100 Units/hr (05/25/17 0359)   PRN Meds: colchicine   Vital Signs    Vitals:   05/25/17 0230 05/25/17 0338 05/25/17 0851 05/25/17 0852  BP: (!) 150/82 (!) 159/72 (!) 146/77   Pulse: 71 78  68  Resp: 20 18    Temp:  97.8 F (36.6 C)    TempSrc:  Oral    SpO2: 97% 96%    Weight:  198 lb 11.2 oz (90.1 kg)    Height:  5\' 3"  (1.6 m)      Intake/Output Summary (Last 24 hours) at 05/25/17 1032 Last data filed at 05/25/17 0444  Gross per 24 hour  Intake            15.58 ml  Output                0 ml  Net            15.58 ml   Filed Weights   05/24/17 2225 05/25/17 0338  Weight: 199 lb (90.3 kg) 198 lb 11.2 oz (90.1 kg)    Telemetry    SR - Personally Reviewed  ECG    N/a - Personally Reviewed  Physical Exam   General: Well developed, well nourished, male appearing in no acute distress. Head: Normocephalic, atraumatic.  Neck: Supple without bruits, JVD. Lungs:  Resp regular and unlabored, CTA. Heart: RRR, S1, S2, no S3, S4, 3/6 systolic murmur; no rub. Abdomen: Soft, non-tender, non-distended with normoactive bowel sounds. No hepatomegaly. No rebound/guarding. No obvious abdominal masses. Extremities: No clubbing, cyanosis, edema. Distal pedal pulses are 2+ bilaterally. Neuro: Alert and oriented X  3. Moves all extremities spontaneously. Psych: Normal affect.  Labs    Chemistry Recent Labs Lab 05/24/17 2228 05/25/17 0749  NA 137 138  K 3.5 3.5  CL 101 104  CO2 26 27  GLUCOSE 216* 205*  BUN 17 15  CREATININE 1.99* 1.67*  CALCIUM 8.9 8.6*  GFRNONAA 32* 40*  GFRAA 37* 46*  ANIONGAP 10 7     Hematology Recent Labs Lab 05/24/17 2228  WBC 4.6  RBC 4.30  HGB 13.2  HCT 38.2*  MCV 88.8  MCH 30.7  MCHC 34.6  RDW 13.3  PLT 216    Cardiac EnzymesNo results for input(s): TROPONINI in the last 168 hours.  Recent Labs Lab 05/24/17 2242  TROPIPOC 0.00     BNPNo results for input(s): BNP, PROBNP in the last 168 hours.   DDimer No results for input(s): DDIMER in the last 168 hours.    Radiology    Ct Coronary Morph W/cta Cor W/score W/ca W/cm &/or Wo/cm  Addendum Date: 05/24/2017   ADDENDUM REPORT: 05/24/2017 17:55 CLINICAL DATA:  71 year old male with h/o  smoking, obesity, insulin-dependent diabetes mellitus, hyperlipidemia, uncontrolled hypertension, OSA, with DOE and negative stress test. EXAM: Cardiac/Coronary  CT TECHNIQUE: The patient was scanned on a [employer]. FINDINGS: A 120 kV prospective scan was triggered in the descending thoracic aorta at 111 HU's. Axial non-contrast 3 mm slices were carried out through the heart. The data set was analyzed on a dedicated work station and scored using the [place]. Gantry rotation speed was 250 msecs and collimation was .6 mm. 10 mg of iv Metoprolol and 0.8 mg of sl NTG was given. The 3D data set was reconstructed in 5% intervals of the 67-82 % of the R-R cycle. Diastolic phases were analyzed on a dedicated work station using MPR, MIP and VRT modes. The patient received 80 cc of contrast. Aorta: Normal size. Mild diffuse calcifications including sinotubular junction. No dissection. Aortic Valve:  Trileaflet.  No calcifications. Coronary Arteries:  Normal coronary origin.  Right dominance. RCA is a medium size  dominant artery that gives rise to acute marginal branch, PDA and PLVB. There is mild diffuse predominantly calcified plaque in the ostium, proximal, mid and distal portions with associated stenosis 25-50%. Left main is a short artery that gives rise to LAD and LCX arteries. There is eccentric calcified plaque with associated stenosis 25-50%. LAD is a large vessel that gives rise to two small diagonal branches. Ostial LAD has a severe mixed, predominantly calcified plaque with slit like appearance of the lumen with associated stenosis > 70%. The entire proximal to mid LAD has a severe long diffuse plaque with stenosis >70%. LCX is a non-dominant artery that gives rise to one large OM1 branch. Ostial and proximal LCX artery has moderate diffuse calcified plaque with associated stenosis 50-69% stenosis. Mid LCX at the bifurcation to OM1 has a severe plaque with associated stenosis > 70%. OM1 has an ostial mixed, predominantly non-calcified plaque with associated stenosis > 70%. Other findings: Normal pulmonary vein drainage into the left atrium. Normal let atrial appendage without a thrombus. Mildly dilated pulmonary artery measuring 32 mm suggestive of pulmonary hypertension. IMPRESSION: 1. Coronary calcium score of 1348. This was 11 percentile for age and sex matched control. 2. Normal coronary origin with right dominance. 3. Severe obstructive 2 vessel disease in the ostial and proximal LAD, mid LCX artery and ostial OM1 vessels. Because of heavy diffuse calcifications additional analysis with CT FFR will be performed. Ena Dawley Electronically Signed   By: Ena Dawley   On: 05/24/2017 17:55   Result Date: 05/24/2017 EXAM: OVER-READ INTERPRETATION  CT CHEST The following report is an over-read performed by radiologist Dr. Collene Leyden [facility], [place] on 05/24/2017. This over-read does not include interpretation of cardiac or coronary anatomy or pathology. The coronary CTA interpretation by  the cardiologist is attached. COMPARISON:  None. FINDINGS: Cardiovascular: Heart is borderline in size. Visualized aorta is normal caliber. Mediastinum/Nodes: No adenopathy in the lower mediastinum or hila. Lungs/Pleura: Visualized lungs are clear.  No effusions. Upper Abdomen: Imaging into the upper abdomen shows no acute findings. Musculoskeletal: Chest wall soft tissues are unremarkable. No acute bony abnormality. IMPRESSION: No acute or significant extracardiac abnormality. Electronically Signed: By: Rolm Baptise M.D. On: 05/24/2017 10:22   Ct Coronary Fractional Flow Reserve Fluid Analysis  Result Date: 05/24/2017 EXAM: FF/RCT ANALYSIS FINDINGS: FFRct analysis was performed on the original cardiac CT angiogram dataset. Diagrammatic representation of the FFRct analysis is provided in a separate PDF document in PACS. This dictation was created using the PDF document  and an interactive 3D model of the results. 3D model is not available in the EMR/PACS. Normal FFR range is >0.80. 1. Left Main:  No significant stenosis. 2. LAD: Ostial LAD has severe stenosis with CT FFR after the lesion 0.69 to 0.61 in the distal LAD. 3. LCX: Mid LCX has severe stenosis with CT FFR 0.74 after the lesion. 4. OM1:  Severe ostial stenosis with CT FFR 0.71 after the lesion. 5. RCA: No significant stenosis. Mid RCA CT FFR: 0.87, distal RCA CT FFR: 0.81. IMPRESSION: 1. CT FFR analysis showed severe obstructive 2 vessel disease in the ostial LAD and LCX and OM1 vessels. A cardiac catheterization is recommended. Electronically Signed   By: Ena Dawley   On: 05/24/2017 17:52    Cardiac Studies   N/a   Patient Profile     71 y.o. male with PMH of HTN, CKD, HL, tobacco use, and mild AS who presented for admission after having CT FFR with 2v disease noted. Planned for cath.   Assessment & Plan    1. Chest pain: underwent outpt CT FFR after reporting exertional symptoms for several weeks prior. CT reported severe 2v  obstructive disease in the LAD, LCx and osital OM1. Currently on IV heparin drip, along with ASA. Planned for cardiac cath today.  -- The patient understands that risks included but are not limited to stroke (1 in 1000), death (1 in 40), kidney failure [usually temporary] (1 in 500), bleeding (1 in 200), allergic reaction [possibly serious] (1 in 200).   2. HTN: Borderline controlled. May need further adjustment of medications this admission.  3. HL: on statin therapy  4. NIDDM: Will hold glipizide with plans for cath today as to avoid hypoglycemia  5. Mild AS: Sounds more prominent on exam. Consider repeating echo this admission  6. CKD: Baseline Cr 1.5, was increased at 1.9 on admission. Improved to 1.6 today. Now getting pre-hydration IVFs for cath.   Signed, Reino Bellis, NP  05/25/2017, 10:32 AM  Pager # [phone]   Patient seen, examined. Available data reviewed. Agree with findings, assessment, and plan as outlined by Reino Bellis, NP.  Exam reveals a pleasant overweight African-American male in no distress.  JVP is normal.  Heart is regular rate and rhythm with a harsh 2/6 mid peaking systolic murmur at the right upper sternal border.  Lungs are clear.  Abdomen is soft and nontender.  Peripheral pulses are 2+ and equal.  There is no peripheral edema.  I reviewed the patient's gated cardiac CTA and FFR study demonstrating severe proximal/ostial LAD stenosis and severe circumflex stenosis with abnormal FFR in both vessels.  I reviewed these findings with the patient and told him that catheterization will better define whether he can be treated with PCI for may require surgical revascularization.  The patient is on 3 antianginal drugs and continues to have CCS class II symptoms of exertional angina.  I reviewed the risks, indications, and alternatives to cardiac catheterization.  Agree with updating an echocardiogram to better evaluate the degree of his aortic stenosis.  Other plans  as outlined above.  Sherren Mocha, M.D. 05/25/2017 11:39 AM   For questions or updates, please contact [facility] Please consult [email or web address] for contact info under Cardiology/STEMI. Daytime calls, contact the Day Call APP (6a-8a) or assigned team (Teams A-D) provider (7:30a - 5p). All other daytime calls (7:30-5p), contact the Card Master @ [phone].   Nighttime calls, contact the assigned APP (5p-8p) or MD (6:30p-8p). Overnight calls (8p-6a),  contact the on call Fellow @ [phone].

## 2017-05-25 NOTE — Progress Notes (Signed)
ANTICOAGULATION CONSULT NOTE - Initial Consult  Pharmacy Consult for heparin Indication: 3v disease for CABG  Allergies  Allergen Reactions  . Penicillins Other (See Comments)    Passed out  . Tramadol Other (See Comments)    Hiccups and vomiting  . Cortisone     Hiccups and vomiting  . Lisinopril Cough    Patient Measurements: Height: 5\' 3"  (160 cm) Weight: 198 lb 11.2 oz (90.1 kg) IBW/kg (Calculated) : 56.9 Heparin Dosing Weight: 76 kg   Vital Signs: Temp: 98 F (36.7 C) (10/31 1300) Temp Source: Oral (10/31 1300) BP: 146/90 (10/31 1626) Pulse Rate: 70 (10/31 1626)  Labs:  Recent Labs  05/24/17 2228 05/25/17 0749 05/25/17 1118 05/25/17 1345  HGB 13.2  --   --   --   HCT 38.2*  --   --   --   PLT 216  --   --   --   LABPROT  --   --  12.9  --   INR  --   --  0.98  --   HEPARINUNFRC  --   --  0.29*  --   CREATININE 1.99* 1.67*  --  1.47*    Estimated Creatinine Clearance: 46.4 mL/min (A) (by C-G formula based on SCr of 1.47 mg/dL (H)).   Medical History: Past Medical History:  Diagnosis Date  . Diabetes mellitus without complication ([place])   . Gout   . Heart murmur   . High cholesterol   . Hypertension   . Sleep apnea     Medications:  Prescriptions Prior to Admission  Medication Sig Dispense Refill Last Dose  . amLODipine (NORVASC) 10 MG tablet Take 1 tablet (10 mg total) by mouth daily. 90 tablet 1 05/23/2017  . aspirin EC 81 MG tablet Take 1 tablet (81 mg total) by mouth daily. 30 tablet 0 05/23/2017 at 0900  . colchicine 0.6 MG tablet Take 1-2 tablets (0.6-1.2 mg total) by mouth daily as needed (for gout flare). 2 po at onset of flair and then repeat in 1 h for acute gout attack then 1 po qd until pain free 30 tablet 0 unk  . furosemide (LASIX) 40 MG tablet Take 1 tablet (40 mg total) by mouth daily. 90 tablet 2 05/23/2017  . glipiZIDE (GLUCOTROL) 10 MG tablet TAKE 1 TABLET BY MOUTH TWICE A DAY BEFORE MEALS. 180 tablet 1 05/23/2017  . isosorbide  mononitrate (IMDUR) 30 MG 24 hr tablet Take 1 tablet (30 mg total) by mouth daily. 90 tablet 1 05/23/2017  . losartan (COZAAR) 100 MG tablet TAKE 1 TABLET (100 MG TOTAL) BY MOUTH DAILY. 90 tablet 1 Past Month at Unknown time  . lovastatin (MEVACOR) 20 MG tablet Take 1 tablet (20 mg total) by mouth at bedtime. 90 tablet 1 05/23/2017  . metFORMIN (GLUCOPHAGE) 500 MG tablet TAKE 1 TABLET (500 MG TOTAL) BY MOUTH 2 (TWO) TIMES DAILY WITH A MEAL. 180 tablet 1 05/23/2017  . metoprolol tartrate (LOPRESSOR) 50 MG tablet Take 1 tablet (50 mg total) by mouth 2 (two) times daily. 180 tablet 1 05/23/2017 at 2000  . canagliflozin (INVOKANA) 100 MG TABS tablet Take 1 tablet (100 mg total) by mouth daily before breakfast. (Patient not taking: Reported on 05/25/2017) 30 tablet 2 Not Taking at Unknown time    Assessment: 4 YOF with severe 3v disease s/p LHC to resume IV heparin 8 hours post sheath pull in preparation for CABG. Sheath was pulled at ~ 1630.   Goal of Therapy:  Heparin level 0.3-0.7 units/ml Monitor platelets by anticoagulation protocol: Yes   Plan:  -Start IV heparin at 1150 units/hr at 1230 AM tomorrow (8 hours post sheath removal). No bolus -F/u 8 hr HL  -Monitor daily HL, CBC and s/s of bleeding   Albertina Parr, PharmD., BCPS Clinical Pharmacist Pager [phone]

## 2017-05-26 ENCOUNTER — Encounter (HOSPITAL_COMMUNITY): Payer: Self-pay

## 2017-05-26 DIAGNOSIS — I208 Other forms of angina pectoris: Secondary | ICD-10-CM | POA: Diagnosis not present

## 2017-05-26 DIAGNOSIS — E1122 Type 2 diabetes mellitus with diabetic chronic kidney disease: Secondary | ICD-10-CM | POA: Diagnosis not present

## 2017-05-26 DIAGNOSIS — I350 Nonrheumatic aortic (valve) stenosis: Secondary | ICD-10-CM | POA: Diagnosis not present

## 2017-05-26 DIAGNOSIS — N183 Chronic kidney disease, stage 3 (moderate): Secondary | ICD-10-CM | POA: Diagnosis not present

## 2017-05-26 DIAGNOSIS — I25110 Atherosclerotic heart disease of native coronary artery with unstable angina pectoris: Secondary | ICD-10-CM | POA: Diagnosis not present

## 2017-05-26 DIAGNOSIS — I1310 Hypertensive heart and chronic kidney disease without heart failure, with stage 1 through stage 4 chronic kidney disease, or unspecified chronic kidney disease: Secondary | ICD-10-CM | POA: Diagnosis not present

## 2017-05-26 DIAGNOSIS — N179 Acute kidney failure, unspecified: Secondary | ICD-10-CM | POA: Diagnosis not present

## 2017-05-26 LAB — CBC
HEMATOCRIT: 38.6 % — AB (ref 39.0–52.0)
Hemoglobin: 13.2 g/dL (ref 13.0–17.0)
MCH: 30.1 pg (ref 26.0–34.0)
MCHC: 34.2 g/dL (ref 30.0–36.0)
MCV: 88.1 fL (ref 78.0–100.0)
PLATELETS: 219 10*3/uL (ref 150–400)
RBC: 4.38 MIL/uL (ref 4.22–5.81)
RDW: 13.3 % (ref 11.5–15.5)
WBC: 4.7 10*3/uL (ref 4.0–10.5)

## 2017-05-26 LAB — GLUCOSE, CAPILLARY: GLUCOSE-CAPILLARY: 188 mg/dL — AB (ref 65–99)

## 2017-05-26 MED ORDER — ATORVASTATIN CALCIUM 80 MG PO TABS: 80.0000 mg | ORAL_TABLET | Freq: Every day | ORAL | 6 refills | Status: DC

## 2017-05-26 MED ORDER — NITROGLYCERIN 0.4 MG SL SUBL: 0.4000 mg | SUBLINGUAL_TABLET | SUBLINGUAL | 12 refills | Status: DC | PRN

## 2017-05-26 NOTE — Progress Notes (Signed)
Patient received discharge instructions with wife at bedside. Per cardiology, okay to go home, no chest pain. Aware of follow up with cardiothoracic surgeon soon outpatient. Patient and wife educated on new medications. Verbalized understanding and had no further questions. PIV removed.

## 2017-05-26 NOTE — Progress Notes (Addendum)
Progress Note  Patient Name: Antonio Wells Date of Encounter: 05/26/2017  Primary Cardiologist: Meda Coffee  Subjective   The patient is doing well.  He denies chest pain or shortness of breath.  He has not had any resting chest pain and only experiences anginal symptoms with physical exertion.  Inpatient Medications    Scheduled Meds: . amLODipine  10 mg Oral Daily  . aspirin EC  81 mg Oral Daily  . atorvastatin  80 mg Oral q1800  . insulin aspart  0-15 Units Subcutaneous TID WC  . insulin aspart  0-5 Units Subcutaneous QHS  . isosorbide mononitrate  30 mg Oral Daily  . metoprolol tartrate  50 mg Oral BID  . sodium chloride flush  3 mL Intravenous Q12H   Continuous Infusions: . sodium chloride 10 mL/hr at 05/25/17 0400  . sodium chloride    . heparin 1,150 Units/hr (05/26/17 0020)   PRN Meds: sodium chloride, colchicine, sodium chloride flush   Vital Signs    Vitals:   05/25/17 2139 05/25/17 2317 05/26/17 0409 05/26/17 0823  BP: (!) 158/80 (!) 149/74 (!) 155/77 (!) 143/71  Pulse: 70 60 60 64  Resp:   18   Temp:   98.4 F (36.9 C) 98.2 F (36.8 C)  TempSrc:   Oral Oral  SpO2: 99% 98% 97% 97%  Weight:   198 lb 1.6 oz (89.9 kg)   Height:        Intake/Output Summary (Last 24 hours) at 05/26/17 0915 Last data filed at 05/26/17 0600  Gross per 24 hour  Intake          1268.09 ml  Output             1100 ml  Net           168.09 ml   Filed Weights   05/24/17 2225 05/25/17 0338 05/26/17 0409  Weight: 199 lb (90.3 kg) 198 lb 11.2 oz (90.1 kg) 198 lb 1.6 oz (89.9 kg)    Telemetry    Sinus rhythm- Personally Reviewed   Physical Exam  Alert, oriented male in no distress GEN: No acute distress.   Neck: No JVD Cardiac: RRR, 2/6 harsh early peaking systolic murmur at the right upper sternal border Respiratory: Clear to auscultation bilaterally. GI: Soft, nontender, non-distended  MS: No edema; No deformity.  Right radial site is clear Neuro:  Nonfocal    Psych: Normal affect   Labs    Chemistry Recent Labs Lab 05/24/17 2228 05/25/17 0749 05/25/17 1345  NA 137 138 140  K 3.5 3.5 3.7  CL 101 104 105  CO2 26 27 29   GLUCOSE 216* 205* 162*  BUN 17 15 14   CREATININE 1.99* 1.67* 1.47*  CALCIUM 8.9 8.6* 8.6*  GFRNONAA 32* 40* 47*  GFRAA 37* 46* 54*  ANIONGAP 10 7 6      Hematology Recent Labs Lab 05/24/17 2228 05/26/17 0413  WBC 4.6 4.7  RBC 4.30 4.38  HGB 13.2 13.2  HCT 38.2* 38.6*  MCV 88.8 88.1  MCH 30.7 30.1  MCHC 34.6 34.2  RDW 13.3 13.3  PLT 216 219    Cardiac EnzymesNo results for input(s): TROPONINI in the last 168 hours.  Recent Labs Lab 05/24/17 2242  TROPIPOC 0.00     BNPNo results for input(s): BNP, PROBNP in the last 168 hours.   DDimer No results for input(s): DDIMER in the last 168 hours.   Radiology    Ct Coronary Morph W/cta Cor Nancy Fetter W/ca  W/cm &/or Wo/cm  Addendum Date: 05/24/2017   ADDENDUM REPORT: 05/24/2017 17:55 CLINICAL DATA:  71 year old male with h/o smoking, obesity, insulin-dependent diabetes mellitus, hyperlipidemia, uncontrolled hypertension, OSA, with DOE and negative stress test. EXAM: Cardiac/Coronary  CT TECHNIQUE: The patient was scanned on a [employer]. FINDINGS: A 120 kV prospective scan was triggered in the descending thoracic aorta at 111 HU's. Axial non-contrast 3 mm slices were carried out through the heart. The data set was analyzed on a dedicated work station and scored using the [facility]. Gantry rotation speed was 250 msecs and collimation was .6 mm. 10 mg of iv Metoprolol and 0.8 mg of sl NTG was given. The 3D data set was reconstructed in 5% intervals of the 67-82 % of the R-R cycle. Diastolic phases were analyzed on a dedicated work station using MPR, MIP and VRT modes. The patient received 80 cc of contrast. Aorta: Normal size. Mild diffuse calcifications including sinotubular junction. No dissection. Aortic Valve:  Trileaflet.  No calcifications.  Coronary Arteries:  Normal coronary origin.  Right dominance. RCA is a medium size dominant artery that gives rise to acute marginal branch, PDA and PLVB. There is mild diffuse predominantly calcified plaque in the ostium, proximal, mid and distal portions with associated stenosis 25-50%. Left main is a short artery that gives rise to LAD and LCX arteries. There is eccentric calcified plaque with associated stenosis 25-50%. LAD is a large vessel that gives rise to two small diagonal branches. Ostial LAD has a severe mixed, predominantly calcified plaque with slit like appearance of the lumen with associated stenosis > 70%. The entire proximal to mid LAD has a severe long diffuse plaque with stenosis >70%. LCX is a non-dominant artery that gives rise to one large OM1 branch. Ostial and proximal LCX artery has moderate diffuse calcified plaque with associated stenosis 50-69% stenosis. Mid LCX at the bifurcation to OM1 has a severe plaque with associated stenosis > 70%. OM1 has an ostial mixed, predominantly non-calcified plaque with associated stenosis > 70%. Other findings: Normal pulmonary vein drainage into the left atrium. Normal let atrial appendage without a thrombus. Mildly dilated pulmonary artery measuring 32 mm suggestive of pulmonary hypertension. IMPRESSION: 1. Coronary calcium score of 1348. This was 48 percentile for age and sex matched control. 2. Normal coronary origin with right dominance. 3. Severe obstructive 2 vessel disease in the ostial and proximal LAD, mid LCX artery and ostial OM1 vessels. Because of heavy diffuse calcifications additional analysis with CT FFR will be performed. Ena Dawley Electronically Signed   By: Ena Dawley   On: 05/24/2017 17:55   Result Date: 05/24/2017 EXAM: OVER-READ INTERPRETATION  CT CHEST The following report is an over-read performed by radiologist Dr. Collene Leyden [facility] [place] on 05/24/2017. This over-read does not include interpretation  of cardiac or coronary anatomy or pathology. The coronary CTA interpretation by the cardiologist is attached. COMPARISON:  None. FINDINGS: Cardiovascular: Heart is borderline in size. Visualized aorta is normal caliber. Mediastinum/Nodes: No adenopathy in the lower mediastinum or hila. Lungs/Pleura: Visualized lungs are clear.  No effusions. Upper Abdomen: Imaging into the upper abdomen shows no acute findings. Musculoskeletal: Chest wall soft tissues are unremarkable. No acute bony abnormality. IMPRESSION: No acute or significant extracardiac abnormality. Electronically Signed: By: Rolm Baptise M.D. On: 05/24/2017 10:22   Ct Coronary Fractional Flow Reserve Fluid Analysis  Result Date: 05/24/2017 EXAM: FF/RCT ANALYSIS FINDINGS: FFRct analysis was performed on the original cardiac CT angiogram dataset. Diagrammatic representation of  the FFRct analysis is provided in a separate PDF document in PACS. This dictation was created using the PDF document and an interactive 3D model of the results. 3D model is not available in the EMR/PACS. Normal FFR range is >0.80. 1. Left Main:  No significant stenosis. 2. LAD: Ostial LAD has severe stenosis with CT FFR after the lesion 0.69 to 0.61 in the distal LAD. 3. LCX: Mid LCX has severe stenosis with CT FFR 0.74 after the lesion. 4. OM1:  Severe ostial stenosis with CT FFR 0.71 after the lesion. 5. RCA: No significant stenosis. Mid RCA CT FFR: 0.87, distal RCA CT FFR: 0.81. IMPRESSION: 1. CT FFR analysis showed severe obstructive 2 vessel disease in the ostial LAD and LCX and OM1 vessels. A cardiac catheterization is recommended. Electronically Signed   By: Ena Dawley   On: 05/24/2017 17:52    Cardiac Studies   Echo: Study Conclusions  - Left ventricle: The cavity size was normal. Wall thickness was   increased in a pattern of mild LVH. Systolic function was normal.   The estimated ejection fraction was in the range of 60% to 65%.   Wall motion was normal;  there were no regional wall motion   abnormalities. Doppler parameters are consistent with abnormal   left ventricular relaxation (grade 1 diastolic dysfunction). - Aortic valve: Mildly calcified leaflets. Mild stenosis. Mean   gradient (S): 12 mm Hg. Peak gradient (S): 22 mm Hg. Valve area   (VTI): 1.59 cm^2. Valve area (Vmax): 1.59 cm^2. Valve area   (Vmean): 1.67 cm^2. - Mitral valve: Mildly thickened leaflets . There was mild   regurgitation. - Left atrium: The atrium was normal in size. - Atrial septum: No defect or patent foramen ovale was identified. - Tricuspid valve: There was trivial regurgitation. - Pulmonary arteries: PA peak pressure: 31 mm Hg (S). - Inferior vena cava: The vessel was normal in size. The   respirophasic diameter changes were in the normal range (>= 50%),   consistent with normal central venous pressure.  Impressions:  - Compared to a prior study in 2016, the LVEF is unchanged at   60-65%. There is mild aortic stenosis wtih a small increase in   the mean gradient from 8 to 12 mmHg, corresponding to an AVA of   around 1.6 cm2.  Cath: Conclusion     Ost LAD lesion, 90 %stenosed.  Prox Cx to Mid Cx lesion, 80 %stenosed.  Ost 1st Mrg to 1st Mrg lesion, 90 %stenosed.   1.  Significant two-vessel coronary artery disease including ostial LAD.  The coronary arteries are moderately calcified especially the LAD. 2.  Normal LV systolic function by echocardiogram.  Left ventricular angiography was not performed. 3.  Mild gradient across the aortic valve suggestive of mild stenosis with a mean gradient of 14 mmHg.  Recommendations: Given ostial location of the LAD stenosis as well as bifurcation disease of the left circumflex, I think the best option is CABG especially with underlying diabetes. Aortic stenosis does not appear to be significant enough to require addressing at the present time.    Patient Profile     71 y.o. male with exertional angina  found to have severe multivessel coronary artery disease by CT/FFR confirmed by cardiac catheterization  Assessment & Plan    1.  Coronary artery disease with exertional angina: CCS class II symptoms on multidrug antianginal therapy.  The patient has proximal LAD and severe circumflex stenosis.  With his diabetes and proximal  lesion location, CABG has been recommended.  Reviewed options with him.  Considering the stability of his anginal, I feel comfortable discharging him from the hospital for outpatient cardiac surgical evaluation.  He has had no resting angina, nor is he had anginal symptoms with low level activity.  He is on an aggressive antianginal program that includes amlodipine, isosorbide, and metoprolol.  2.  Hypertension: Continue current medications  3.  Hyperlipidemia: Patient has been changed to a high intensity statin drug with atorvastatin 80 mg daily.  4.  Type 2 diabetes: The patient will continue on his oral hypoglycemic agents.  Recommend that he start back on metformin tomorrow  5.  Aortic stenosis, mild: TCTS to evaluate options of aortic valve replacement at the time of CABG versus continued surveillance. Mean gradient increased from 8 --> 12 mmHg most recent echo   Disposition: Stop heparin, discharge home today, have communicated with the cardiac surgery office who will call him with an appointment either this afternoon or tomorrow.  For questions or updates, please contact [place] Please consult [email or web address] for contact info under Cardiology/STEMI.      Deatra James, MD  05/26/2017, 9:15 AM

## 2017-05-26 NOTE — Care Management Obs Status (Signed)
[place] NOTIFICATION   Patient Details  Name: Antonio Wells MRN: [id number] Date of Birth: 1945/09/26   Medicare Observation Status Notification Given:  Yes    Bethena Roys, RN 05/26/2017, 10:01 AM

## 2017-05-26 NOTE — Discharge Summary (Signed)
Discharge Summary    Patient ID: Antonio Wells,  MRN: [id number], DOB/AGE: 12/23/1945 71 y.o.  Admit date: 05/25/2017 Discharge date: 05/26/2017  Primary Care Provider: [name] R Primary Cardiologist: Dr. Meda Coffee   Discharge Diagnoses    Active Problems:   CAD (coronary artery disease)   Unstable angina (HCC)   HTN   HLd   DM   Mild AS   Acute on CKD stage III  Allergies Allergies  Allergen Reactions  . Penicillins Other (See Comments)    Passed out  . Tramadol Other (See Comments)    Hiccups and vomiting  . Cortisone     Hiccups and vomiting  . Lisinopril Cough    Diagnostic Studies/Procedures    Echo: Study Conclusions  - Left ventricle: The cavity size was normal. Wall thickness was increased in a pattern of mild LVH. Systolic function was normal. The estimated ejection fraction was in the range of 60% to 65%. Wall motion was normal; there were no regional wall motion abnormalities. Doppler parameters are consistent with abnormal left ventricular relaxation (grade 1 diastolic dysfunction). - Aortic valve: Mildly calcified leaflets. Mild stenosis. Mean gradient (S): 12 mm Hg. Peak gradient (S): 22 mm Hg. Valve area (VTI): 1.59 cm^2. Valve area (Vmax): 1.59 cm^2. Valve area (Vmean): 1.67 cm^2. - Mitral valve: Mildly thickened leaflets . There was mild regurgitation. - Left atrium: The atrium was normal in size. - Atrial septum: No defect or patent foramen ovale was identified. - Tricuspid valve: There was trivial regurgitation. - Pulmonary arteries: PA peak pressure: 31 mm Hg (S). - Inferior vena cava: The vessel was normal in size. The respirophasic diameter changes were in the normal range (>= 50%), consistent with normal central venous pressure.  Impressions:  - Compared to a prior study in 2016, the LVEF is unchanged at 60-65%. There is mild aortic stenosis wtih a small increase in the mean gradient from 8  to 12 mmHg, corresponding to an AVA of around 1.6 cm2.  Cath: Conclusion     Ost LAD lesion, 90 %stenosed.  Prox Cx to Mid Cx lesion, 80 %stenosed.  Ost 1st Mrg to 1st Mrg lesion, 90 %stenosed.  1. Significant two-vessel coronary artery disease including ostial LAD. The coronary arteries are moderately calcified especially the LAD. 2. Normal LV systolic function by echocardiogram. Left ventricular angiography was not performed. 3. Mild gradient across the aortic valve suggestive of mild stenosis with a mean gradient of 14 mmHg.  Recommendations: Given ostial location of the LAD stenosis as well as bifurcation disease of the left circumflex, I think the best option is CABG especially with underlying diabetes. Aortic stenosis does not appear to be significant enough to require addressing at the present time.      History of Present Illness     71 yo male w/ DMII, HTN, CKD, prior smoker and mild AS who underwent CT FFR, found to have severe 2 vessel disease, including ostial LAD.   Seen by Dr. Meda Coffee in 10/3 for DOE that has been worsening. Underwent CTA for further evaluation and found to have severe disease. Brought to ED to undergo further care and LHC.   Hospital Course     Consultants: None  1.  Coronary artery disease with exertional angina: cath showed two-vessel coronary artery disease including ostial LAD and bifurcation disease of the left circumflex. With his diabetes and proximal lesion location, CABG has been recommended. He has had no resting angina, nor is he had  anginal symptoms with low level activity on current antianginal therapy. Felt stable for discharge with outpatient cardiac surgical evaluation. Dr. Burt Knack has communicated with the cardiac surgery office who will call him with an appointment either this afternoon or tomorrow.  2.  Hypertension: Stable on current medications  3.  Hyperlipidemia: 05/12/2017: Cholesterol, Total 159; HDL 45; LDL  Calculated 81; Triglycerides 166. Started on atorvastatin 80 mg daily. Lipid panel and LFT in 6 weeks.   4.  Type 2 diabetes: The patient will continue on his oral hypoglycemic agents.  Recommend that he start back on metformin tomorrow  5.  Aortic stenosis, mild: TCTS to evaluate options of aortic valve replacement at the time of CABG versus continued surveillance. Mean gradient increased from 8 --> 12 mmHg most recent echo   6. Acute on CKD stage III - Scr of 1.99 on admission. Lasix and Losartan held during admission. Scr improved to 1.47 (at baseline) with hydration. Resumed lasix at discharge. Continue to hold Losartan.    The patient has been seen by Dr. Burt Knack today and deemed ready for discharge home. All follow-up appointments have been scheduled. Discharge medications are listed below.   Discharge Vitals Blood pressure (!) 143/71, pulse 64, temperature 98.2 F (36.8 C), temperature source Oral, resp. rate 18, height 5\' 3"  (1.6 m), weight 198 lb 1.6 oz (89.9 kg), SpO2 97 %.  Filed Weights   05/24/17 2225 05/25/17 0338 05/26/17 0409  Weight: 199 lb (90.3 kg) 198 lb 11.2 oz (90.1 kg) 198 lb 1.6 oz (89.9 kg)    Labs & Radiologic Studies     CBC  Recent Labs  05/24/17 2228 05/26/17 0413  WBC 4.6 4.7  HGB 13.2 13.2  HCT 38.2* 38.6*  MCV 88.8 88.1  PLT 216 951   Basic Metabolic Panel  Recent Labs  05/25/17 0749 05/25/17 1345  NA 138 140  K 3.5 3.7  CL 104 105  CO2 27 29  GLUCOSE 205* 162*  BUN 15 14  CREATININE 1.67* 1.47*  CALCIUM 8.6* 8.6*     Ct Coronary Morph W/cta Cor W/score W/ca W/cm &/or Wo/cm  Addendum Date: 05/24/2017   ADDENDUM REPORT: 05/24/2017 17:55 CLINICAL DATA:  71 year old male with h/o smoking, obesity, insulin-dependent diabetes mellitus, hyperlipidemia, uncontrolled hypertension, OSA, with DOE and negative stress test. EXAM: Cardiac/Coronary  CT TECHNIQUE: The patient was scanned on a [employer]. FINDINGS: A 120 kV  prospective scan was triggered in the descending thoracic aorta at 111 HU's. Axial non-contrast 3 mm slices were carried out through the heart. The data set was analyzed on a dedicated work station and scored using the [employer]. Gantry rotation speed was 250 msecs and collimation was .6 mm. 10 mg of iv Metoprolol and 0.8 mg of sl NTG was given. The 3D data set was reconstructed in 5% intervals of the 67-82 % of the R-R cycle. Diastolic phases were analyzed on a dedicated work station using MPR, MIP and VRT modes. The patient received 80 cc of contrast. Aorta: Normal size. Mild diffuse calcifications including sinotubular junction. No dissection. Aortic Valve:  Trileaflet.  No calcifications. Coronary Arteries:  Normal coronary origin.  Right dominance. RCA is a medium size dominant artery that gives rise to acute marginal branch, PDA and PLVB. There is mild diffuse predominantly calcified plaque in the ostium, proximal, mid and distal portions with associated stenosis 25-50%. Left main is a short artery that gives rise to LAD and LCX arteries. There is eccentric calcified plaque with  associated stenosis 25-50%. LAD is a large vessel that gives rise to two small diagonal branches. Ostial LAD has a severe mixed, predominantly calcified plaque with slit like appearance of the lumen with associated stenosis > 70%. The entire proximal to mid LAD has a severe long diffuse plaque with stenosis >70%. LCX is a non-dominant artery that gives rise to one large OM1 branch. Ostial and proximal LCX artery has moderate diffuse calcified plaque with associated stenosis 50-69% stenosis. Mid LCX at the bifurcation to OM1 has a severe plaque with associated stenosis > 70%. OM1 has an ostial mixed, predominantly non-calcified plaque with associated stenosis > 70%. Other findings: Normal pulmonary vein drainage into the left atrium. Normal let atrial appendage without a thrombus. Mildly dilated pulmonary artery measuring 32 mm  suggestive of pulmonary hypertension. IMPRESSION: 1. Coronary calcium score of 1348. This was 58 percentile for age and sex matched control. 2. Normal coronary origin with right dominance. 3. Severe obstructive 2 vessel disease in the ostial and proximal LAD, mid LCX artery and ostial OM1 vessels. Because of heavy diffuse calcifications additional analysis with CT FFR will be performed. Ena Dawley Electronically Signed   By: Ena Dawley   On: 05/24/2017 17:55   Result Date: 05/24/2017 EXAM: OVER-READ INTERPRETATION  CT CHEST The following report is an over-read performed by radiologist Dr. Collene Leyden [facility] [place] on 05/24/2017. This over-read does not include interpretation of cardiac or coronary anatomy or pathology. The coronary CTA interpretation by the cardiologist is attached. COMPARISON:  None. FINDINGS: Cardiovascular: Heart is borderline in size. Visualized aorta is normal caliber. Mediastinum/Nodes: No adenopathy in the lower mediastinum or hila. Lungs/Pleura: Visualized lungs are clear.  No effusions. Upper Abdomen: Imaging into the upper abdomen shows no acute findings. Musculoskeletal: Chest wall soft tissues are unremarkable. No acute bony abnormality. IMPRESSION: No acute or significant extracardiac abnormality. Electronically Signed: By: Rolm Baptise M.D. On: 05/24/2017 10:22   Ct Coronary Fractional Flow Reserve Fluid Analysis  Result Date: 05/24/2017 EXAM: FF/RCT ANALYSIS FINDINGS: FFRct analysis was performed on the original cardiac CT angiogram dataset. Diagrammatic representation of the FFRct analysis is provided in a separate PDF document in PACS. This dictation was created using the PDF document and an interactive 3D model of the results. 3D model is not available in the EMR/PACS. Normal FFR range is >0.80. 1. Left Main:  No significant stenosis. 2. LAD: Ostial LAD has severe stenosis with CT FFR after the lesion 0.69 to 0.61 in the distal LAD. 3. LCX: Mid LCX  has severe stenosis with CT FFR 0.74 after the lesion. 4. OM1:  Severe ostial stenosis with CT FFR 0.71 after the lesion. 5. RCA: No significant stenosis. Mid RCA CT FFR: 0.87, distal RCA CT FFR: 0.81. IMPRESSION: 1. CT FFR analysis showed severe obstructive 2 vessel disease in the ostial LAD and LCX and OM1 vessels. A cardiac catheterization is recommended. Electronically Signed   By: Ena Dawley   On: 05/24/2017 17:52    Disposition   Pt is being discharged home today in good condition.  Follow-up Plans & Appointments    Follow-up Information    [facility] Follow up.   Specialty:  Cardiothoracic Surgery Why:  office will call with time and date. IF you did not heard, anything by tomorrow please call  Contact information: [address], [place] [phone]         Discharge Instructions    Diet -  low sodium heart healthy    Complete by:  As directed    Discharge instructions    Complete by:  As directed    No driving until seen by Surgeon. No lifting over 5 lbs for 1 week. No sexual activity for 1 week.  Keep procedure site clean & dry. If you notice increased pain, swelling, bleeding or pus, call/return!  You may shower, but no soaking baths/hot tubs/pools for 1 week.  Hold metformin today. Resume tomorrow.   Increase activity slowly    Complete by:  As directed       Discharge Medications   Current Discharge Medication List    START taking these medications   Details  atorvastatin (LIPITOR) 80 MG tablet Take 1 tablet (80 mg total) by mouth daily at 6 PM. Qty: 30 tablet, Refills: 6    nitroGLYCERIN (NITROSTAT) 0.4 MG SL tablet Place 1 tablet (0.4 mg total) under the tongue every 5 (five) minutes as needed for chest pain. Qty: 25 tablet, Refills: 12      CONTINUE these medications which have NOT CHANGED   Details  amLODipine (NORVASC) 10 MG tablet Take 1 tablet (10 mg total)  by mouth daily. Qty: 90 tablet, Refills: 1   Associated Diagnoses: Essential hypertension    aspirin EC 81 MG tablet Take 1 tablet (81 mg total) by mouth daily. Qty: 30 tablet, Refills: 0    colchicine 0.6 MG tablet Take 1-2 tablets (0.6-1.2 mg total) by mouth daily as needed (for gout flare). 2 po at onset of flair and then repeat in 1 h for acute gout attack then 1 po qd until pain free Qty: 30 tablet, Refills: 0    furosemide (LASIX) 40 MG tablet Take 1 tablet (40 mg total) by mouth daily. Qty: 90 tablet, Refills: 2    glipiZIDE (GLUCOTROL) 10 MG tablet TAKE 1 TABLET BY MOUTH TWICE A DAY BEFORE MEALS. Qty: 180 tablet, Refills: 1   Associated Diagnoses: Type 2 diabetes mellitus with hyperglycemia, without long-term current use of insulin (HCC)    isosorbide mononitrate (IMDUR) 30 MG 24 hr tablet Take 1 tablet (30 mg total) by mouth daily. Qty: 90 tablet, Refills: 1   Associated Diagnoses: Essential hypertension    metFORMIN (GLUCOPHAGE) 500 MG tablet TAKE 1 TABLET (500 MG TOTAL) BY MOUTH 2 (TWO) TIMES DAILY WITH A MEAL. Qty: 180 tablet, Refills: 1   Associated Diagnoses: Type 2 diabetes mellitus with hyperglycemia, without long-term current use of insulin (HCC)    metoprolol tartrate (LOPRESSOR) 50 MG tablet Take 1 tablet (50 mg total) by mouth 2 (two) times daily. Qty: 180 tablet, Refills: 1   Associated Diagnoses: Essential hypertension    canagliflozin (INVOKANA) 100 MG TABS tablet Take 1 tablet (100 mg total) by mouth daily before breakfast. Qty: 30 tablet, Refills: 2   Associated Diagnoses: Type 2 diabetes mellitus with hyperglycemia, without long-term current use of insulin (HCC)      STOP taking these medications     losartan (COZAAR) 100 MG tablet      lovastatin (MEVACOR) 20 MG tablet          Outstanding Labs/Studies   Lipid panel and LFT in 6 weeks.   Duration of Discharge Encounter   Greater than 30 minutes including physician  time.  Signed, Brianny Soulliere PA-C 05/26/2017, 10:25 AM

## 2017-05-27 ENCOUNTER — Telehealth: Payer: Self-pay

## 2017-05-27 NOTE — Telephone Encounter (Signed)
Transition Care Management Follow-up Telephone Call   Date discharged? 05/26/17   How have you been since you were released from the hospital? Patient is still having shortness of breath. But is having not chest pains.    Do you understand why you were in the hospital? yes   Do you understand the discharge instructions? yes   Where were you discharged to? Home   Items Reviewed:  Medications reviewed: yes  Allergies reviewed: yes  Dietary changes reviewed: yes  Referrals reviewed: yes   Functional Questionnaire:   Activities of Daily Living (ADLs):   He states they are independent in the following: ambulation, bathing and hygiene, feeding, continence, grooming, toileting and dressing States they require assistance with the following: none   Any transportation issues/concerns?: no   Any patient concerns? no   Confirmed importance and date/time of follow-up visits scheduled yes  Patient has a follow up visit scheduled with his cardiothoracic surgeon on 06/02/16. He states that he doesn't have to follow up with his PCP right now only his Psychologist, sport and exercise. He will call and schedule follow up with PCP after his surgeon has cleared him to do so.   Confirmed with patient if condition begins to worsen call PCP or go to the ER.  Patient was given the office number and encouraged to call back with question or concerns.  : yes

## 2017-06-02 ENCOUNTER — Institutional Professional Consult (permissible substitution) (INDEPENDENT_AMBULATORY_CARE_PROVIDER_SITE_OTHER): Payer: Medicare Other | Admitting: Surgery

## 2017-06-02 ENCOUNTER — Encounter: Payer: Self-pay | Admitting: Surgery

## 2017-06-02 ENCOUNTER — Other Ambulatory Visit: Payer: Self-pay

## 2017-06-02 VITALS — BP 151/83 | HR 67 | Ht 63.0 in | Wt 198.0 lb

## 2017-06-02 DIAGNOSIS — I200 Unstable angina: Secondary | ICD-10-CM | POA: Diagnosis not present

## 2017-06-02 DIAGNOSIS — I25118 Atherosclerotic heart disease of native coronary artery with other forms of angina pectoris: Secondary | ICD-10-CM | POA: Diagnosis not present

## 2017-06-02 DIAGNOSIS — I209 Angina pectoris, unspecified: Secondary | ICD-10-CM

## 2017-06-03 ENCOUNTER — Other Ambulatory Visit: Payer: Self-pay

## 2017-06-03 DIAGNOSIS — I2510 Atherosclerotic heart disease of native coronary artery without angina pectoris: Secondary | ICD-10-CM

## 2017-06-07 ENCOUNTER — Encounter: Payer: Self-pay | Admitting: Surgery

## 2017-06-07 NOTE — Progress Notes (Signed)
PCP is [name], MD Referring Provider is Wellington Hampshire, MD  Chief Complaint  Patient presents with  . New Patient (Initial Visit)    evaluation for CABG    HPI:  The patient is a 71 year old gentleman with a history of insulin-dependent diabetes for the past 5 years, hypertension, hyperlipidemia, obstructive sleep apnea, prior smoking until 15 years ago, who was evaluated by Dr. Meda Coffee for new onset exertional shortness of breath.  He says that he had been previously very active and was riding an exercise bike for up to an hour 3 times per week but now seems to get tired very quickly with shortness of breath.  He has had lower extremity swelling.  He denies any chest pain or pressure.  He has had no pain in his neck, shoulders, or arms.  He denies any dizziness or syncope.  He had a gated cardiac CT on 05/24/2017 which showed a coronary calcium score of 1348 as well as severe obstructive 2 vessel disease in the ostium and proximal LAD greater than 70%.  The left circumflex coronary artery had ostial and proximal disease with 50-69% stenosis.  An FFR analysis showed severe obstructive two-vessel coronary disease in the ostial LAD and left circumflex as well as the OM1 branch.  Cardiac catheterization on 05/25/2017 showed a 90% ostial LAD stenosis.  There was 80% stenosis in the proximal to mid left circumflex.  There was 90% stenosis in the first marginal branch.  There is mild aortic stenosis with a mean gradient of 14 mmHg.  He had an echocardiogram done on 05/25/2017 which showed normal left ventricular ejection fraction of 60-65%.  There is grade 1 diastolic dysfunction.  The aortic valve had mildly calcified leaflets with a mean gradient of 12 mmHg and a peak gradient of 22 mmHg and a dimensionless index of 0.42 consistent with mild aortic stenosis.  The mitral valve had mildly thickened leaflets with a peak gradient of 4 mmHg and mild regurgitation.  Past Medical History:    Diagnosis Date  . Diabetes mellitus without complication ([place])   . Gout   . Heart murmur   . High cholesterol   . Hypertension   . Sleep apnea     Past Surgical History:  Procedure Laterality Date  . COLONOSCOPY  2008    Family History  Problem Relation Age of Onset  . Hypertension Mother   . Kidney disease Mother   . Diabetes Mother   . Hypertension Father   . Cancer Brother        lung  . Stroke Brother   . Colon cancer Neg Hx     Social History Social History   Tobacco Use  . Smoking status: Former Smoker    Packs/day: 2.00    Years: 15.00    Pack years: 30.00    Types: Cigarettes    Last attempt to quit: 07/26/1998    Years since quitting: 18.8  . Smokeless tobacco: Never Used  Substance Use Topics  . Alcohol use: No    Alcohol/week: 0.0 oz  . Drug use: No    Current Outpatient Medications  Medication Sig Dispense Refill  . amLODipine (NORVASC) 10 MG tablet Take 1 tablet (10 mg total) by mouth daily. 90 tablet 1  . aspirin EC 81 MG tablet Take 1 tablet (81 mg total) by mouth daily. 30 tablet 0  . atorvastatin (LIPITOR) 80 MG tablet Take 1 tablet (80 mg total) by mouth daily at  6 PM. 30 tablet 6  . furosemide (LASIX) 40 MG tablet Take 1 tablet (40 mg total) by mouth daily. 90 tablet 2  . glipiZIDE (GLUCOTROL) 10 MG tablet TAKE 1 TABLET BY MOUTH TWICE A DAY BEFORE MEALS. 180 tablet 1  . isosorbide mononitrate (IMDUR) 30 MG 24 hr tablet Take 1 tablet (30 mg total) by mouth daily. 90 tablet 1  . metFORMIN (GLUCOPHAGE) 500 MG tablet TAKE 1 TABLET (500 MG TOTAL) BY MOUTH 2 (TWO) TIMES DAILY WITH A MEAL. 180 tablet 1  . metoprolol tartrate (LOPRESSOR) 50 MG tablet Take 1 tablet (50 mg total) by mouth 2 (two) times daily. 180 tablet 1  . nitroGLYCERIN (NITROSTAT) 0.4 MG SL tablet Place 1 tablet (0.4 mg total) under the tongue every 5 (five) minutes as needed for chest pain. 25 tablet 12  . colchicine 0.6 MG tablet Take 1-2 tablets (0.6-1.2 mg total) by mouth daily  as needed (for gout flare). 2 po at onset of flair and then repeat in 1 h for acute gout attack then 1 po qd until pain free (Patient not taking: Reported on 06/02/2017) 30 tablet 0   No current facility-administered medications for this visit.     Allergies  Allergen Reactions  . Penicillins Other (See Comments)    Passed out  . Tramadol Other (See Comments)    Hiccups and vomiting  . Cortisone     Hiccups and vomiting  . Lisinopril Cough    Review of Systems  Constitutional: Positive for activity change and fatigue. Negative for appetite change.  HENT: Negative.   Eyes: Negative.   Respiratory: Positive for shortness of breath.   Cardiovascular: Positive for leg swelling. Negative for chest pain and palpitations.  Gastrointestinal: Negative.   Endocrine: Negative.   Genitourinary: Negative.   Musculoskeletal: Positive for arthralgias and gait problem.  Skin: Negative.   Neurological: Negative for dizziness and syncope.  Hematological: Negative.   Psychiatric/Behavioral: Negative.     BP (!) 151/83   Pulse 67   Ht 5\' 3"  (1.6 m)   Wt 198 lb (89.8 kg)   SpO2 98%   BMI 35.07 kg/m  Physical Exam  Constitutional: He is oriented to person, place, and time. He appears well-developed and well-nourished. No distress.  HENT:  Head: Normocephalic and atraumatic.  Mouth/Throat: Oropharynx is clear and moist.  Eyes: EOM are normal. Pupils are equal, round, and reactive to light.  Neck: Normal range of motion. Neck supple. No JVD present. No thyromegaly present.  Cardiovascular: Normal rate and regular rhythm.  Murmur heard. 2/6 systolic murmur RSB  Pulmonary/Chest: Effort normal and breath sounds normal. No respiratory distress. He has no rales.  Abdominal: Soft. Bowel sounds are normal. He exhibits no distension and no mass. There is no tenderness.  Musculoskeletal: Normal range of motion. He exhibits no edema.  Lymphadenopathy:    He has no cervical adenopathy.   Neurological: He is alert and oriented to person, place, and time. He has normal strength. No cranial nerve deficit or sensory deficit.  Skin: Skin is warm and dry.  Psychiatric: He has a normal mood and affect.     Diagnostic Tests:  Physicians   Panel Physicians Referring Physician Case Authorizing Physician  Wellington Hampshire, MD (Primary)    Procedures   LEFT HEART CATH AND CORONARY ANGIOGRAPHY  Conclusion     Ost LAD lesion, 90 %stenosed.  Prox Cx to Mid Cx lesion, 80 %stenosed.  Ost 1st Mrg to 1st Mrg lesion, 90 %  stenosed.   1.  Significant two-vessel coronary artery disease including ostial LAD.  The coronary arteries are moderately calcified especially the LAD. 2.  Normal LV systolic function by echocardiogram.  Left ventricular angiography was not performed. 3.  Mild gradient across the aortic valve suggestive of mild stenosis with a mean gradient of 14 mmHg.  Recommendations: Given ostial location of the LAD stenosis as well as bifurcation disease of the left circumflex, I think the best option is CABG especially with underlying diabetes. Aortic stenosis does not appear to be significant enough to require addressing at the present time.   Indications   Effort angina ([place]) [I20.8 (ICD-10-CM)]  Procedural Details/Technique   Technical Details Procedural Details: The right wrist was prepped, draped, and anesthetized with 1% lidocaine. Using the modified Seldinger technique, a 5 French sheath was introduced into the right radial artery. 3 mg of verapamil was administered through the sheath, weight-based unfractionated heparin was administered intravenously. A Jackie catheter was used for selective coronary angiography and LV pressure. A JL 3.5 catheter was needed to engage the left main. Catheter exchanges were performed over an exchange length guidewire. There were no immediate procedural complications. A TR band was used for radial hemostasis at the completion of  the procedure. The patient was transferred to the post catheterization recovery area for further monitoring.   Estimated blood loss <50 mL.  During this procedure the patient was administered the following to achieve and maintain moderate conscious sedation: Versed 1 mg, Fentanyl 50 mcg, while the patient's heart rate, blood pressure, and oxygen saturation were continuously monitored. The period of conscious sedation was 21 minutes, of which I was present face-to-face 100% of this time.  Coronary Findings   Diagnostic  Dominance: Right  Left Main  There is mild the vessel.  Left Anterior Descending  Ost LAD lesion 90% stenosed  The lesion is moderately calcified.  First Diagonal Branch  Vessel is small in size. The vessel exhibits minimal luminal irregularities.  Second Diagonal Branch  The vessel exhibits minimal luminal irregularities.  Third Diagonal Branch  Vessel is angiographically normal.  Lateral Third Diagonal Branch  Vessel is angiographically normal.  Ramus Intermedius  Vessel is small. The vessel exhibits minimal luminal irregularities.  Left Circumflex  Prox Cx to Mid Cx lesion 80% stenosed  Prox Cx to Mid Cx lesion.  First Obtuse Marginal Branch  Ost 1st Mrg to 1st Mrg lesion 90% stenosed  Ost 1st Mrg to 1st Mrg lesion.  Second Obtuse Marginal Branch  Vessel is angiographically normal.  Third Obtuse Marginal Branch  Vessel is angiographically normal.  Right Coronary Artery  There is mild the vessel.  Right Posterior Descending Artery  The vessel exhibits minimal luminal irregularities.  Right Posterior Atrioventricular Branch  The vessel exhibits minimal luminal irregularities.  First Right Posterolateral  The vessel exhibits minimal luminal irregularities.  Second Right Posterolateral  The vessel exhibits minimal luminal irregularities.  Intervention   No interventions have been documented.  Coronary Diagrams   Diagnostic Diagram       Implants      No implant documentation for this case.  MERGE Images   Show images for CARDIAC CATHETERIZATION   Link to Procedure Log   Procedure Log    Hemo Data    Most Recent Value  Aortic Mean Gradient 26.1 mmHg  Aortic Peak Gradient 19 mmHg  AO Systolic Pressure 329 mmHg  AO Diastolic Pressure 60 mmHg  AO Mean 94 mmHg  LV Systolic Pressure 518  mmHg  LV Diastolic Pressure 6 mmHg  LV EDP 17 mmHg  Arterial Occlusion Pressure Extended Systolic Pressure 585 mmHg  Arterial Occlusion Pressure Extended Diastolic Pressure 66 mmHg  Arterial Occlusion Pressure Extended Mean Pressure 97 mmHg  Left Ventricular Apex Extended Systolic Pressure 277 mmHg  Left Ventricular Apex Extended Diastolic Pressure 9 mmHg  Left Ventricular Apex Extended EDP Pressure 22 mmHg    Result status: Final result                              *[facility]*                         [address], [place] [address]                            [phone]  ------------------------------------------------------------------- Transthoracic Echocardiography  Patient:    Antonio Wells, Weigold MR #:       [id number] Study Date: 05/25/2017 Gender:     M Age:        69 Height:     160 cm Weight:     90 kg BSA:        2.04 m^2 Pt. Status: Room:       6E04C   PERFORMING   [facility], Inpatient  ORDERING     Pricilla Holm  ADMITTING    Elson Areas, [place]  ATTENDING    Durenda Age  SONOGRAPHER  Dance, Tiffany  cc:  ------------------------------------------------------------------- LV EF: 60% -   65%  ------------------------------------------------------------------- Indications:      Aortic stenosis 424.1.  ------------------------------------------------------------------- History:   PMH:   Murmur.  Risk factors:  Hypertension. Diabetes mellitus.  Dyslipidemia.  ------------------------------------------------------------------- Study Conclusions  - Left ventricle: The cavity size was normal. Wall thickness was   increased in a pattern of mild LVH. Systolic function was normal.   The estimated ejection fraction was in the range of 60% to 65%.   Wall motion was normal; there were no regional wall motion   abnormalities. Doppler parameters are consistent with abnormal   left ventricular relaxation (grade 1 diastolic dysfunction). - Aortic valve: Mildly calcified leaflets. Mild stenosis. Mean   gradient (S): 12 mm Hg. Peak gradient (S): 22 mm Hg. Valve area   (VTI): 1.59 cm^2. Valve area (Vmax): 1.59 cm^2. Valve area   (Vmean): 1.67 cm^2. - Mitral valve: Mildly thickened leaflets . There was mild   regurgitation. - Left atrium: The atrium was normal in size. - Atrial septum: No defect or patent foramen ovale was identified. - Tricuspid valve: There was trivial regurgitation. - Pulmonary arteries: PA peak pressure: 31 mm Hg (S). - Inferior vena cava: The vessel was normal in size. The   respirophasic diameter changes were in the normal range (>= 50%),   consistent with normal central venous pressure.  Impressions:  - Compared to a prior study in 2016, the LVEF is unchanged at   60-65%. There is mild aortic stenosis wtih a small increase in   the mean gradient from 8 to 12 mmHg, corresponding to  an AVA of   around 1.6 cm2.  ------------------------------------------------------------------- Study data:  Comparison was made to the study of 07/14/2015.  Study status:  Routine.  Procedure:  The patient reported no pain pre or post test. Transthoracic echocardiography. Image quality was adequate.  Study completion:  There were no complications. Transthoracic echocardiography.  M-mode, complete 2D, spectral Doppler, and color Doppler.  Birthdate:  Patient birthdate: 04-27-1946.  Age:  Patient is 71 yr old.  Sex:  Gender:  male. BMI: 35.1 kg/m^2.  Blood pressure:     146/67  Patient status: Inpatient.  Study date:  Study date: 05/25/2017. Study time: 02:47 PM.  Location:  Bedside.  -------------------------------------------------------------------  ------------------------------------------------------------------- Left ventricle:  The cavity size was normal. Wall thickness was increased in a pattern of mild LVH. Systolic function was normal. The estimated ejection fraction was in the range of 60% to 65%. Wall motion was normal; there were no regional wall motion abnormalities. Doppler parameters are consistent with abnormal left ventricular relaxation (grade 1 diastolic dysfunction). The E/e&' ratio is >15, suggesting elevated LV filling pressure.  ------------------------------------------------------------------- Aortic valve:   Mildly calcified leaflets. Mild stenosis.  Doppler:     VTI ratio of LVOT to aortic valve: 0.42. Valve area (VTI): 1.59 cm^2. Indexed valve area (VTI): 0.78 cm^2/m^2. Peak velocity ratio of LVOT to aortic valve: 0.42. Valve area (Vmax): 1.59 cm^2. Indexed valve area (Vmax): 0.78 cm^2/m^2. Mean velocity ratio of LVOT to aortic valve: 0.44. Valve area (Vmean): 1.67 cm^2. Indexed valve area (Vmean): 0.82 cm^2/m^2.    Mean gradient (S): 12 mm Hg. Peak gradient (S): 22 mm Hg.  ------------------------------------------------------------------- Aorta:  Aortic root: The aortic root was normal in size. Ascending aorta: The ascending aorta was normal in size.  ------------------------------------------------------------------- Mitral valve:   Mildly thickened leaflets .  Doppler:  There was mild regurgitation.    Peak gradient (D): 4 mm Hg.  ------------------------------------------------------------------- Left atrium:  The atrium was normal in size.  ------------------------------------------------------------------- Atrial septum:  No defect or patent foramen ovale  was identified.   ------------------------------------------------------------------- Right ventricle:  The cavity size was normal. Wall thickness was normal. Systolic function was low normal.  ------------------------------------------------------------------- Pulmonic valve:    The valve appears to be grossly normal. Doppler:  There was trivial regurgitation.  ------------------------------------------------------------------- Tricuspid valve:   Doppler:  There was trivial regurgitation.  ------------------------------------------------------------------- Pulmonary artery:   The main pulmonary artery was normal-sized.  ------------------------------------------------------------------- Right atrium:  The atrium was normal in size.  ------------------------------------------------------------------- Pericardium:  There was no pericardial effusion.  ------------------------------------------------------------------- Systemic veins: Inferior vena cava: The vessel was normal in size. The respirophasic diameter changes were in the normal range (>= 50%), consistent with normal central venous pressure.  ------------------------------------------------------------------- Measurements   Left ventricle                           Value          Reference  LV ID, ED, PLAX chordal                  44.7  mm       43 - 52  LV ID, ES, PLAX chordal                  31.5  mm       23 - 38  LV fx shortening, PLAX chordal           30    %        >=  29  LV PW thickness, ED                      12    mm       ----------  IVS/LV PW ratio, ED                      0.94           <=1.3  Stroke volume, 2D                        90    ml       ----------  Stroke volume/bsa, 2D                    44    ml/m^2   ----------  LV e&', lateral                           6.24  cm/s     ----------  LV E/e&', lateral                         15.63          ----------  LV e&', medial                             5.92  cm/s     ----------  LV E/e&', medial                          16.47          ----------  LV e&', average                           6.08  cm/s     ----------  LV E/e&', average                         16.04          ----------    Ventricular septum                       Value          Reference  IVS thickness, ED                        11.3  mm       ----------    LVOT                                     Value          Reference  LVOT ID, S                               22    mm       ----------  LVOT area                                3.8   cm^2     ----------  LVOT peak velocity,  S                    99.2  cm/s     ----------  LVOT mean velocity, S                    70.4  cm/s     ----------  LVOT VTI, S                              23.6  cm       ----------    Aortic valve                             Value          Reference  Aortic valve peak velocity, S            237   cm/s     ----------  Aortic valve mean velocity, S            160   cm/s     ----------  Aortic valve VTI, S                      56.5  cm       ----------  Aortic mean gradient, S                  12    mm Hg    ----------  Aortic peak gradient, S                  22    mm Hg    ----------  VTI ratio, LVOT/AV                       0.42           ----------  Aortic valve area, VTI                   1.59  cm^2     ----------  Aortic valve area/bsa, VTI               0.78  cm^2/m^2 ----------  Velocity ratio, peak, LVOT/AV            0.42           ----------  Aortic valve area, peak velocity         1.59  cm^2     ----------  Aortic valve area/bsa, peak              0.78  cm^2/m^2 ----------  velocity  Velocity ratio, mean, LVOT/AV            0.44           ----------  Aortic valve area, mean velocity         1.67  cm^2     ----------  Aortic valve area/bsa, mean              0.82  cm^2/m^2 ----------  velocity    Aorta                                    Value          Reference  Aortic root ID, ED  32    mm       ----------  Ascending aorta ID, A-P, S               31    mm       ----------    Left atrium                              Value          Reference  LA ID, A-P, ES                           41    mm       ----------  LA ID/bsa, A-P                           2.01  cm/m^2   <=2.2  LA volume, S                             48.5  ml       ----------  LA volume/bsa, S                         23.8  ml/m^2   ----------  LA volume, ES, 1-p A4C                   42.6  ml       ----------  LA volume/bsa, ES, 1-p A4C               20.9  ml/m^2   ----------  LA volume, ES, 1-p A2C                   50.1  ml       ----------  LA volume/bsa, ES, 1-p A2C               24.6  ml/m^2   ----------    Mitral valve                             Value          Reference  Mitral E-wave peak velocity              97.5  cm/s     ----------  Mitral A-wave peak velocity              125   cm/s     ----------  Mitral deceleration time         (H)     250   ms       150 - 230  Mitral peak gradient, D                  4     mm Hg    ----------  Mitral E/A ratio, peak                   0.8            ----------    Pulmonary arteries                       Value          Reference  PA  pressure, S, DP               (H)     31    mm Hg    <=30    Tricuspid valve                          Value          Reference  Tricuspid regurg peak velocity           264   cm/s     ----------  Tricuspid peak RV-RA gradient            28    mm Hg    ----------    Right atrium                             Value          Reference  RA ID, S-I, ES, A4C              (H)     50.6  mm       34 - 49  RA area, ES, A4C                         14    cm^2     8.3 - 19.5  RA volume, ES, A/L                       32.6  ml       ----------  RA volume/bsa, ES, A/L                   16    ml/m^2   ----------    Right ventricle                          Value          Reference  RV ID, minor axis, ED, A4C base           28.8  mm       ----------  TAPSE                                    26.7  mm       ----------  RV s&', lateral, S                        9.81  cm/s     ----------    Pulmonic valve                           Value          Reference  Pulmonic regurg velocity, ED             92.3  cm/s     ----------  Legend: (L)  and  (H)  mark values outside specified reference range.  ------------------------------------------------------------------- Prepared and Electronically Authenticated by  Lyman Bishop MD 2018-10-31T17:00:21    Impression:  This 71 year old gentleman has severe two-vessel coronary disease with exertional shortness of breath and fatigue as well as mild aortic stenosis.  I agree that given the ostial location of his LAD stenosis as well as the bifurcation  disease in the left circumflex coronary artery and the presence of diabetes, coronary bypass graft surgery is the best treatment.  His aortic stenosis is mild and does not require treatment at this time but will require continued follow-up.  I reviewed the cardiac catheterization and echocardiogram studies with the patient and answered his questions. I discussed the operative procedure with the patient including alternatives, benefits and risks; including but not limited to bleeding, blood transfusion, infection, stroke, myocardial infarction, graft failure, heart block requiring a permanent pacemaker, organ dysfunction, and death.  [name] understands and agrees to proceed.    Plan:  We will schedule surgery for 06/23/2017 at the patient's request.   I spent 60 minutes performing this consultation and > 50% of this time was spent face to face counseling and coordinating the care of this patient's severe 2-vessel coronary artery disease and mild aortic stenosis.  Gaye Pollack, MD [facility] [phone]

## 2017-06-09 DIAGNOSIS — M1711 Unilateral primary osteoarthritis, right knee: Secondary | ICD-10-CM | POA: Diagnosis not present

## 2017-06-09 DIAGNOSIS — M1712 Unilateral primary osteoarthritis, left knee: Secondary | ICD-10-CM | POA: Diagnosis not present

## 2017-06-17 NOTE — Pre-Procedure Instructions (Signed)
JONUEL BUTTERFIELD  06/17/2017      [facility] [address] [place] [address] Phone: [phone] Fax: [phone]  [id number] [address], [address] [place] [address] Phone: [phone] Fax: [phone]    Your procedure is scheduled on 06/23/2017.  Report to [facility] Admitting at [place].  Call this number if you have problems the morning of surgery:  [phone]   Remember:  Do not eat food or drink liquids after midnight.  Take these medicines the morning of surgery with A SIP OF WATER : Amlodipine (Norvasc) Colchicine - if needed Isosorbide Mononitrate (Imdur) Metoprolol tartrate (Lopressor) Nitroglycerin (Nitrostat) - if needed  7 days prior to surgery STOP taking any Aspirin(unless otherwise instructed by your surgeon), Aleve, Naproxen, Ibuprofen, Motrin, Advil, Goody's, BC's, all herbal medications, fish oil, and all vitamins   WHAT DO I DO ABOUT MY DIABETES MEDICATION?   Marland Kitchen Do not take oral diabetes medicines (pills) the morning of surgery.    How to Manage Your Diabetes Before and After Surgery  Why is it important to control my blood sugar before and after surgery? . Improving blood sugar levels before and after surgery helps healing and can limit problems. . A way of improving blood sugar control is eating a healthy diet by: o  Eating less sugar and carbohydrates o  Increasing activity/exercise o  Talking with your doctor about reaching your blood sugar goals . High blood sugars (greater than 180 mg/dL) can raise your risk of infections and slow your recovery, so you will need to focus on controlling your diabetes during the weeks before surgery. . Make sure that the doctor who takes care of your diabetes knows about your planned surgery including the date and location.  How do  I manage my blood sugar before surgery? . Check your blood sugar at least 4 times a day, starting 2 days before surgery, to make sure that the level is not too high or low. o Check your blood sugar the morning of your surgery when you wake up and every 2 hours until you get to the Short Stay unit. . If your blood sugar is less than 70 mg/dL, you will need to treat for low blood sugar: o Do not take insulin. o Treat a low blood sugar (less than 70 mg/dL) with  cup of clear juice (cranberry or apple), 4 glucose tablets, OR glucose gel. Recheck blood sugar in 15 minutes after treatment (to make sure it is greater than 70 mg/dL). If your blood sugar is not greater than 70 mg/dL on recheck, call [phone] o  for further instructions. . Report your blood sugar to the short stay nurse when you get to Short Stay.  . If you are admitted to the hospital after surgery: o Your blood sugar will be checked by the staff and you will probably be given insulin after surgery (instead of oral diabetes medicines) to make sure you have good blood sugar levels. o The goal for blood sugar control after surgery is 80-180 mg/dL.       Do not wear jewelry, make-up or nail polish.  Do not wear lotions, powders, or perfumes, or deoderant.  Do not shave 48 hours prior to surgery.  Men may shave face and neck.  Do not bring valuables to the hospital.  [facility] is not responsible for any belongings or valuables.  Contacts, eyeglasses, dentures or bridgework may not be worn into surgery.  Leave your suitcase in the car.  After surgery it may be brought to your room.  For patients admitted to the hospital, discharge time will be determined by your treatment team.  Patients discharged the day of surgery will not be allowed to drive home.   Name and phone number of your driver:    Special instructions:   [facility]- Preparing For Surgery  Before surgery, you can play an important role. Because skin is not  sterile, your skin needs to be as free of germs as possible. You can reduce the number of germs on your skin by washing with CHG (chlorahexidine gluconate) Soap before surgery.  CHG is an antiseptic cleaner which kills germs and bonds with the skin to continue killing germs even after washing.  Please do not use if you have an allergy to CHG or antibacterial soaps. If your skin becomes reddened/irritated stop using the CHG.  Do not shave (including legs and underarms) for at least 48 hours prior to first CHG shower. It is OK to shave your face.  Please follow these instructions carefully.   1. Shower the NIGHT BEFORE SURGERY and the MORNING OF SURGERY with CHG.   2. If you chose to wash your hair, wash your hair first as usual with your normal shampoo.  3. After you shampoo, rinse your hair and body thoroughly to remove the shampoo.  4. Use CHG as you would any other liquid soap. You can apply CHG directly to the skin and wash gently with a scrungie or a clean washcloth.   5. Apply the CHG Soap to your body ONLY FROM THE NECK DOWN.  Do not use on open wounds or open sores. Avoid contact with your eyes, ears, mouth and genitals (private parts). Wash Face and genitals (private parts)  with your normal soap.  6. Wash thoroughly, paying special attention to the area where your surgery will be performed.  7. Thoroughly rinse your body with warm water from the neck down.  8. DO NOT shower/wash with your normal soap after using and rinsing off the CHG Soap.  9. Pat yourself dry with a CLEAN TOWEL.  10. Wear CLEAN PAJAMAS to bed the night before surgery, wear comfortable clothes the morning of surgery  11. Place CLEAN SHEETS on your bed the night of your first shower and DO NOT SLEEP WITH PETS.    Day of Surgery: Shower as stated above. Do not apply any deodorants/lotions.  Please wear clean clothes to the hospital/surgery center.      Please read over the following fact sheets that you  were given.

## 2017-06-20 ENCOUNTER — Ambulatory Visit (HOSPITAL_COMMUNITY)
Admission: RE | Admit: 2017-06-20 | Discharge: 2017-06-20 | Disposition: A | Discharge status: Home / Self Care | Payer: Medicare Other | Source: Ambulatory Visit | Attending: Surgery | Admitting: Surgery

## 2017-06-20 ENCOUNTER — Encounter (HOSPITAL_COMMUNITY): Payer: Self-pay

## 2017-06-20 ENCOUNTER — Encounter (HOSPITAL_COMMUNITY)
Admission: RE | Admit: 2017-06-20 | Discharge: 2017-06-20 | Disposition: A | Discharge status: Home / Self Care | Payer: Medicare Other | Source: Ambulatory Visit | Attending: Surgery | Admitting: Surgery

## 2017-06-20 DIAGNOSIS — Z87891 Personal history of nicotine dependence: Secondary | ICD-10-CM | POA: Diagnosis not present

## 2017-06-20 DIAGNOSIS — Z01818 Encounter for other preprocedural examination: Secondary | ICD-10-CM | POA: Diagnosis not present

## 2017-06-20 DIAGNOSIS — I350 Nonrheumatic aortic (valve) stenosis: Secondary | ICD-10-CM | POA: Insufficient documentation

## 2017-06-20 DIAGNOSIS — I2510 Atherosclerotic heart disease of native coronary artery without angina pectoris: Secondary | ICD-10-CM | POA: Insufficient documentation

## 2017-06-20 DIAGNOSIS — E1122 Type 2 diabetes mellitus with diabetic chronic kidney disease: Secondary | ICD-10-CM | POA: Diagnosis not present

## 2017-06-20 DIAGNOSIS — E7800 Pure hypercholesterolemia, unspecified: Secondary | ICD-10-CM | POA: Diagnosis not present

## 2017-06-20 DIAGNOSIS — G4733 Obstructive sleep apnea (adult) (pediatric): Secondary | ICD-10-CM | POA: Diagnosis not present

## 2017-06-20 DIAGNOSIS — Z79899 Other long term (current) drug therapy: Secondary | ICD-10-CM | POA: Insufficient documentation

## 2017-06-20 DIAGNOSIS — Z01812 Encounter for preprocedural laboratory examination: Secondary | ICD-10-CM | POA: Diagnosis not present

## 2017-06-20 DIAGNOSIS — Z0183 Encounter for blood typing: Secondary | ICD-10-CM | POA: Insufficient documentation

## 2017-06-20 DIAGNOSIS — N189 Chronic kidney disease, unspecified: Secondary | ICD-10-CM | POA: Insufficient documentation

## 2017-06-20 DIAGNOSIS — I129 Hypertensive chronic kidney disease with stage 1 through stage 4 chronic kidney disease, or unspecified chronic kidney disease: Secondary | ICD-10-CM | POA: Diagnosis not present

## 2017-06-20 DIAGNOSIS — Z7982 Long term (current) use of aspirin: Secondary | ICD-10-CM | POA: Insufficient documentation

## 2017-06-20 DIAGNOSIS — R918 Other nonspecific abnormal finding of lung field: Secondary | ICD-10-CM | POA: Diagnosis not present

## 2017-06-20 DIAGNOSIS — M1990 Unspecified osteoarthritis, unspecified site: Secondary | ICD-10-CM | POA: Diagnosis not present

## 2017-06-20 DIAGNOSIS — M109 Gout, unspecified: Secondary | ICD-10-CM | POA: Diagnosis not present

## 2017-06-20 DIAGNOSIS — Z7984 Long term (current) use of oral hypoglycemic drugs: Secondary | ICD-10-CM | POA: Insufficient documentation

## 2017-06-20 HISTORY — DX: Atherosclerotic heart disease of native coronary artery without angina pectoris: I25.10

## 2017-06-20 HISTORY — DX: Unspecified osteoarthritis, unspecified site: M19.90

## 2017-06-20 LAB — PULMONARY FUNCTION TEST
DL/VA % pred: 166 %
DL/VA: 6.79 ml/min/mmHg/L
DLCO COR % PRED: 90 %
DLCO UNC: 19.83 ml/min/mmHg
DLCO cor: 20.77 ml/min/mmHg
DLCO unc % pred: 86 %
FEF 25-75 POST: 2.83 L/s
FEF 25-75 Pre: 1.88 L/sec
FEF2575-%Change-Post: 50 %
FEF2575-%PRED-POST: 153 %
FEF2575-%PRED-PRE: 102 %
FEV1-%Change-Post: 1 %
FEV1-%PRED-POST: 84 %
FEV1-%Pred-Pre: 83 %
FEV1-POST: 1.77 L
FEV1-PRE: 1.75 L
FEV1FVC-%CHANGE-POST: 1 %
FEV1FVC-%PRED-PRE: 115 %
FEV6-%CHANGE-POST: 0 %
FEV6-%Pred-Post: 74 %
FEV6-%Pred-Pre: 74 %
FEV6-Post: 1.99 L
FEV6-Pre: 1.99 L
FEV6FVC-%Pred-Post: 106 %
FEV6FVC-%Pred-Pre: 106 %
FVC-%CHANGE-POST: 0 %
FVC-%PRED-POST: 70 %
FVC-%PRED-PRE: 70 %
FVC-POST: 1.99 L
FVC-PRE: 1.99 L
POST FEV6/FVC RATIO: 100 %
PRE FEV1/FVC RATIO: 88 %
Post FEV1/FVC ratio: 89 %
Pre FEV6/FVC Ratio: 100 %

## 2017-06-20 LAB — COMPREHENSIVE METABOLIC PANEL
ALT: 20 U/L (ref 17–63)
ANION GAP: 10 (ref 5–15)
AST: 21 U/L (ref 15–41)
Albumin: 4.2 g/dL (ref 3.5–5.0)
Alkaline Phosphatase: 63 U/L (ref 38–126)
BILIRUBIN TOTAL: 0.6 mg/dL (ref 0.3–1.2)
BUN: 22 mg/dL — AB (ref 6–20)
CO2: 24 mmol/L (ref 22–32)
Calcium: 8.7 mg/dL — ABNORMAL LOW (ref 8.9–10.3)
Chloride: 106 mmol/L (ref 101–111)
Creatinine, Ser: 1.71 mg/dL — ABNORMAL HIGH (ref 0.61–1.24)
GFR calc Af Amer: 45 mL/min — ABNORMAL LOW (ref >60)
GFR, EST NON AFRICAN AMERICAN: 39 mL/min — AB (ref >60)
Glucose, Bld: 171 mg/dL — ABNORMAL HIGH (ref 65–99)
POTASSIUM: 3.6 mmol/L (ref 3.5–5.1)
Sodium: 140 mmol/L (ref 135–145)
TOTAL PROTEIN: 7.1 g/dL (ref 6.5–8.1)

## 2017-06-20 LAB — ABO/RH: ABO/RH(D): A POS

## 2017-06-20 LAB — BLOOD GAS, ARTERIAL
Acid-Base Excess: 1.7 mmol/L (ref 0.0–2.0)
BICARBONATE: 26.1 mmol/L (ref 20.0–28.0)
Drawn by: 421801
FIO2: 21
O2 Saturation: 93.6 %
PH ART: 7.4 (ref 7.350–7.450)
PO2 ART: 70.9 mmHg — AB (ref 83.0–108.0)
Patient temperature: 98.6
pCO2 arterial: 43 mmHg (ref 32.0–48.0)

## 2017-06-20 LAB — CBC
HEMATOCRIT: 38.2 % — AB (ref 39.0–52.0)
Hemoglobin: 13.1 g/dL (ref 13.0–17.0)
MCH: 30.9 pg (ref 26.0–34.0)
MCHC: 34.3 g/dL (ref 30.0–36.0)
MCV: 90.1 fL (ref 78.0–100.0)
Platelets: 182 10*3/uL (ref 150–400)
RBC: 4.24 MIL/uL (ref 4.22–5.81)
RDW: 13.6 % (ref 11.5–15.5)
WBC: 5 10*3/uL (ref 4.0–10.5)

## 2017-06-20 LAB — TYPE AND SCREEN
ABO/RH(D): A POS
Antibody Screen: NEGATIVE

## 2017-06-20 LAB — HEMOGLOBIN A1C
HEMOGLOBIN A1C: 8.2 % — AB (ref 4.8–5.6)
Mean Plasma Glucose: 188.64 mg/dL

## 2017-06-20 LAB — APTT: aPTT: 36 seconds (ref 24–36)

## 2017-06-20 LAB — PROTIME-INR
INR: 0.94
Prothrombin Time: 12.5 seconds (ref 11.4–15.2)

## 2017-06-20 LAB — URINALYSIS, ROUTINE W REFLEX MICROSCOPIC
Bilirubin Urine: NEGATIVE
GLUCOSE, UA: NEGATIVE mg/dL
Hgb urine dipstick: NEGATIVE
Ketones, ur: NEGATIVE mg/dL
LEUKOCYTES UA: NEGATIVE
Nitrite: NEGATIVE
PROTEIN: NEGATIVE mg/dL
Specific Gravity, Urine: 1.016 (ref 1.005–1.030)
pH: 5 (ref 5.0–8.0)

## 2017-06-20 LAB — SURGICAL PCR SCREEN
MRSA, PCR: NEGATIVE
STAPHYLOCOCCUS AUREUS: NEGATIVE

## 2017-06-20 LAB — GLUCOSE, CAPILLARY: GLUCOSE-CAPILLARY: 145 mg/dL — AB (ref 65–99)

## 2017-06-20 MED ORDER — ALBUTEROL SULFATE (2.5 MG/3ML) 0.083% IN NEBU
2.5000 mg | INHALATION_SOLUTION | Freq: Once | RESPIRATORY_TRACT | Status: AC
  Administered 2017-06-20: 2.5 mg via RESPIRATORY_TRACT

## 2017-06-20 NOTE — Progress Notes (Signed)
Pre-op Cardiac Surgery  Carotid Findings:  1-39% ICA stenosis.  Vertebral artery flow is antegrade.   Upper Extremity Right Left  Brachial Pressures 139T 122T  Radial Waveforms T T  Ulnar Waveforms T T  Palmar Arch (Allen's Test) Doppler signal remains normal with radial compression and obliterates with ulnar compression Doppler signal remains normal with radial compression and obliterates with ulnar compression     Findings:      Lower  Extremity Right Left  Dorsalis Pedis    Anterior Tibial 121B 159T  Posterior Tibial 138T 156T  Ankle/Brachial Indices 0.99 1.14    Findings:  ABIs and waveforms are within normal limits

## 2017-06-21 NOTE — Progress Notes (Signed)
Anesthesia Chart Review: Patient is a 71 year old male scheduled for CABG on 06/24/17 by Dr. Cyndia Bent (was moved from 06/23/17).  History includes former smoker (quit '00), CAD, murmur (mild AS), DM2, HTN, hypercholesterolemia, CKD, gout, OSA, arthritis. BMI is consistent with obesity.  PCP is listed as Dr. [name] ([facility]). Cardiologist is Dr. Ena Dawley.   Meds include amlodipine, aspirin 81 mg, Lipitor, colchicine, Lasix, glipizide, Imdur, metformin, Lopressor, nitroglycerin.  BP 129/60   Pulse 66   Temp 36.6 C   Resp 20   Ht 5\' 3"  (1.6 m)   Wt 195 lb (88.5 kg)   SpO2 96%   BMI 34.54 kg/m    EKG 05/25/17: NSR, LVH.  Cardiac cath 05/25/17:  Ost LAD lesion, 90 %stenosed.  Prox Cx to Mid Cx lesion, 80 %stenosed.  Ost 1st Mrg to 1st Mrg lesion, 90 %stenosed.  1.  Significant two-vessel coronary artery disease including ostial LAD.  The coronary arteries are moderately calcified especially the LAD. 2.  Normal LV systolic function by echocardiogram.  Left ventricular angiography was not performed. 3.  Mild gradient across the aortic valve suggestive of mild stenosis with a mean gradient of 14 mmHg. Recommendations: Given ostial location of the LAD stenosis as well as bifurcation disease of the left circumflex, I think the best option is CABG especially with underlying diabetes. Aortic stenosis does not appear to be significant enough to require addressing at the present time.  Echo 05/25/17: Study Conclusions - Left ventricle: The cavity size was normal. Wall thickness was   increased in a pattern of mild LVH. Systolic function was normal.   The estimated ejection fraction was in the range of 60% to 65%.   Wall motion was normal; there were no regional wall motion   abnormalities. Doppler parameters are consistent with abnormal   left ventricular relaxation (grade 1 diastolic dysfunction). - Aortic valve: Mildly calcified leaflets. Mild stenosis.  Mean   gradient (S): 12 mm Hg. Peak gradient (S): 22 mm Hg. Valve area   (VTI): 1.59 cm^2. Valve area (Vmax): 1.59 cm^2. Valve area   (Vmean): 1.67 cm^2. - Mitral valve: Mildly thickened leaflets . There was mild   regurgitation. - Left atrium: The atrium was normal in size. - Atrial septum: No defect or patent foramen ovale was identified. - Tricuspid valve: There was trivial regurgitation. - Pulmonary arteries: PA peak pressure: 31 mm Hg (S). - Inferior vena cava: The vessel was normal in size. The   respirophasic diameter changes were in the normal range (>= 50%),   consistent with normal central venous pressure. Impressions: - Compared to a prior study in 2016, the LVEF is unchanged at   60-65%. There is mild aortic stenosis wtih a small increase in   the mean gradient from 8 to 12 mmHg, corresponding to an AVA of   around 1.6 cm2.  Carotid U/S 06/20/17:  Right Carotid: There is evidence in the right ICA of a 1-39% stenosis. Left Carotid: There is evidence in the left ICA of a 1-39% stenosis. Vertebrals: Both vertebral arteries were patent with antegrade flow.  CXR 06/20/17: IMPRESSION: No active cardiopulmonary disease.  PFTs 06/20/17: FVC 1.99 (70%), FEV1 1.75 (83%), DLCO unc 19.83 (86%).   Preoperative labs noted. BUN 22, Cr 1.71 (previously 1.33-1.99 since 04/27/17; previously Cr ~ 1.3-1.5 since at least 2016). H/H 13.1/38.2, PLT 182K. A1c 8.2. UA WNL.   If no acute changes then I anticipate that he can proceed as planned.  [facility] Short Stay Center/Anesthesiology Phone [phone] 06/21/2017 6:53 PM

## 2017-06-23 MED ORDER — SODIUM CHLORIDE 0.9 % IV SOLN
INTRAVENOUS | Status: AC
  Administered 2017-06-24: 2.7 [IU]/h via INTRAVENOUS
  Filled 2017-06-23: qty 1

## 2017-06-23 MED ORDER — MILRINONE LACTATE IN DEXTROSE 20-5 MG/100ML-% IV SOLN
0.1250 ug/kg/min | INTRAVENOUS | Status: DC
  Filled 2017-06-23: qty 100

## 2017-06-23 MED ORDER — CHLORHEXIDINE GLUCONATE 0.12 % MT SOLN
15.0000 mL | OROMUCOSAL | Status: AC
  Administered 2017-06-24: 15 mL via OROMUCOSAL
  Filled 2017-06-23: qty 15

## 2017-06-23 MED ORDER — PHENYLEPHRINE HCL 10 MG/ML IJ SOLN
30.0000 ug/min | INTRAMUSCULAR | Status: DC
  Filled 2017-06-23: qty 2

## 2017-06-23 MED ORDER — DEXMEDETOMIDINE HCL IN NACL 400 MCG/100ML IV SOLN
0.1000 ug/kg/h | INTRAVENOUS | Status: AC
  Administered 2017-06-24: .3 ug/kg/h via INTRAVENOUS
  Filled 2017-06-23: qty 100

## 2017-06-23 MED ORDER — VANCOMYCIN HCL 10 G IV SOLR
1500.0000 mg | INTRAVENOUS | Status: AC
  Administered 2017-06-24: 1500 mg via INTRAVENOUS
  Filled 2017-06-23: qty 1500

## 2017-06-23 MED ORDER — PLASMA-LYTE 148 IV SOLN
INTRAVENOUS | Status: AC
  Administered 2017-06-24: 500 mL
  Filled 2017-06-23: qty 2.5

## 2017-06-23 MED ORDER — LEVOFLOXACIN IN D5W 500 MG/100ML IV SOLN
500.0000 mg | INTRAVENOUS | Status: AC
  Administered 2017-06-24: 500 mg via INTRAVENOUS
  Filled 2017-06-23: qty 100

## 2017-06-23 MED ORDER — METOPROLOL TARTRATE 12.5 MG HALF TABLET: 12.5000 mg | ORAL_TABLET | ORAL | Status: DC

## 2017-06-23 MED ORDER — MAGNESIUM SULFATE 50 % IJ SOLN
40.0000 meq | INTRAMUSCULAR | Status: DC
  Filled 2017-06-23: qty 9.85

## 2017-06-23 MED ORDER — EPINEPHRINE PF 1 MG/ML IJ SOLN
0.0000 ug/min | INTRAMUSCULAR | Status: DC
  Filled 2017-06-23: qty 4

## 2017-06-23 MED ORDER — DOPAMINE-DEXTROSE 3.2-5 MG/ML-% IV SOLN
0.0000 ug/kg/min | INTRAVENOUS | Status: AC
  Administered 2017-06-24: 2 ug/kg/min via INTRAVENOUS
  Filled 2017-06-23: qty 250

## 2017-06-23 MED ORDER — SODIUM CHLORIDE 0.9 % IV SOLN
INTRAVENOUS | Status: DC
  Filled 2017-06-23: qty 30

## 2017-06-23 MED ORDER — SODIUM CHLORIDE 0.9 % IV SOLN
1.5000 mg/kg/h | INTRAVENOUS | Status: AC
  Administered 2017-06-24: 1.5 mg/kg/h via INTRAVENOUS
  Filled 2017-06-23: qty 25

## 2017-06-23 MED ORDER — POTASSIUM CHLORIDE 2 MEQ/ML IV SOLN
80.0000 meq | INTRAVENOUS | Status: DC
  Filled 2017-06-23: qty 40

## 2017-06-23 MED ORDER — TRANEXAMIC ACID (OHS) PUMP PRIME SOLUTION
2.0000 mg/kg | INTRAVENOUS | Status: DC
  Filled 2017-06-23: qty 1.77

## 2017-06-23 MED ORDER — NITROGLYCERIN IN D5W 200-5 MCG/ML-% IV SOLN
2.0000 ug/min | INTRAVENOUS | Status: AC
  Administered 2017-06-24: 16.6 ug/min via INTRAVENOUS
  Filled 2017-06-23: qty 250

## 2017-06-23 MED ORDER — TRANEXAMIC ACID (OHS) BOLUS VIA INFUSION
15.0000 mg/kg | INTRAVENOUS | Status: AC
  Administered 2017-06-24: 1327.5 mg via INTRAVENOUS
  Filled 2017-06-23: qty 1328

## 2017-06-23 NOTE — H&P (Signed)
[address]       [place] [address]             [phone]      Cardiothoracic Surgery Admission History and Physical  PCP is Carlota Raspberry Ranell Patrick, MD  Referring Provider is Wellington Hampshire, MD      Chief Complaint  Patient presents with coronary artery disease  .        HPI:  The patient is a 71 year old gentleman with a history of insulin-dependent diabetes for the past 5 years, hypertension, hyperlipidemia, obstructive sleep apnea, prior smoking until 15 years ago, who was evaluated by Dr. Meda Coffee for new onset exertional shortness of breath. He says that he had been previously very active and was riding an exercise bike for up to an hour 3 times per week but now seems to get tired very quickly with shortness of breath. He has had lower extremity swelling. He denies any chest pain or pressure. He has had no pain in his neck, shoulders, or arms. He denies any dizziness or syncope. He had a gated cardiac CT on 05/24/2017 which showed a coronary calcium score of 1348 as well as severe obstructive 2 vessel disease in the ostium and proximal LAD greater than 70%. The left circumflex coronary artery had ostial and proximal disease with 50-69% stenosis. An FFR analysis showed severe obstructive two-vessel coronary disease in the ostial LAD and left circumflex as well as the OM1 branch. Cardiac catheterization on 05/25/2017 showed a 90% ostial LAD stenosis. There was 80% stenosis in the proximal to mid left circumflex. There was 90% stenosis in the first marginal branch. There is mild aortic stenosis with a mean gradient of 14 mmHg. He had an echocardiogram done on 05/25/2017 which showed normal left ventricular ejection fraction of 60-65%. There is grade 1 diastolic dysfunction. The aortic valve had mildly calcified leaflets with a mean gradient of 12 mmHg and a peak gradient of 22 mmHg and a dimensionless index of 0.42 consistent with mild aortic stenosis. The mitral valve had  mildly thickened leaflets with a peak gradient of 4 mmHg and mild regurgitation.      Past Medical History:  Diagnosis Date  . Diabetes mellitus without complication ([place])   . Gout   . Heart murmur   . High cholesterol   . Hypertension   . Sleep apnea         Past Surgical History:  Procedure Laterality Date  . COLONOSCOPY  2008        Family History  Problem Relation Age of Onset  . Hypertension Mother   . Kidney disease Mother   . Diabetes Mother   . Hypertension Father   . Cancer Brother    lung  . Stroke Brother   . Colon cancer Neg Hx    Social History  Social History        Tobacco Use  . Smoking status: Former Smoker    Packs/day: 2.00    Years: 15.00    Pack years: 30.00    Types: Cigarettes    Last attempt to quit: 07/26/1998    Years since quitting: 18.8  . Smokeless tobacco: Never Used  Substance Use Topics  . Alcohol use: No    Alcohol/week: 0.0 oz  . Drug use: No         Current Outpatient Medications  Medication Sig Dispense Refill  . amLODipine (NORVASC) 10 MG tablet Take 1 tablet (10 mg total)  by mouth daily. 90 tablet 1  . aspirin EC 81 MG tablet Take 1 tablet (81 mg total) by mouth daily. 30 tablet 0  . atorvastatin (LIPITOR) 80 MG tablet Take 1 tablet (80 mg total) by mouth daily at 6 PM. 30 tablet 6  . furosemide (LASIX) 40 MG tablet Take 1 tablet (40 mg total) by mouth daily. 90 tablet 2  . glipiZIDE (GLUCOTROL) 10 MG tablet TAKE 1 TABLET BY MOUTH TWICE A DAY BEFORE MEALS. 180 tablet 1  . isosorbide mononitrate (IMDUR) 30 MG 24 hr tablet Take 1 tablet (30 mg total) by mouth daily. 90 tablet 1  . metFORMIN (GLUCOPHAGE) 500 MG tablet TAKE 1 TABLET (500 MG TOTAL) BY MOUTH 2 (TWO) TIMES DAILY WITH A MEAL. 180 tablet 1  . metoprolol tartrate (LOPRESSOR) 50 MG tablet Take 1 tablet (50 mg total) by mouth 2 (two) times daily. 180 tablet 1  . nitroGLYCERIN (NITROSTAT) 0.4 MG SL tablet Place 1 tablet (0.4 mg total) under the tongue every 5 (five)  minutes as needed for chest pain. 25 tablet 12  . colchicine 0.6 MG tablet Take 1-2 tablets (0.6-1.2 mg total) by mouth daily as needed (for gout flare). 2 po at onset of flair and then repeat in 1 h for acute gout attack then 1 po qd until pain free (Patient not taking: Reported on 06/02/2017) 30 tablet 0   No current facility-administered medications for this visit.         Allergies  Allergen Reactions  . Penicillins Other (See Comments)    Passed out  . Tramadol Other (See Comments)    Hiccups and vomiting  . Cortisone     Hiccups and vomiting  . Lisinopril Cough   Review of Systems  Constitutional: Positive for activity change and fatigue. Negative for appetite change.  HENT: Negative.  Eyes: Negative.  Respiratory: Positive for shortness of breath.  Cardiovascular: Positive for leg swelling. Negative for chest pain and palpitations.  Gastrointestinal: Negative.  Endocrine: Negative.  Genitourinary: Negative.  Musculoskeletal: Positive for arthralgias and gait problem.  Skin: Negative.  Neurological: Negative for dizziness and syncope.  Hematological: Negative.  Psychiatric/Behavioral: Negative.   BP (!) 151/83  Pulse 67  Ht 5\' 3"  (1.6 m)  Wt 198 lb (89.8 kg)  SpO2 98%  BMI 35.07 kg/m  Physical Exam  Constitutional: He is oriented to person, place, and time. He appears well-developed and well-nourished. No distress.  HENT:  Head: Normocephalic and atraumatic.  Mouth/Throat: Oropharynx is clear and moist.  Eyes: EOM are normal. Pupils are equal, round, and reactive to light.  Neck: Normal range of motion. Neck supple. No JVD present. No thyromegaly present.  Cardiovascular: Normal rate and regular rhythm.  Murmur heard. 2/6 systolic murmur RSB  Pulmonary/Chest: Effort normal and breath sounds normal. No respiratory distress. He has no rales.  Abdominal: Soft. Bowel sounds are normal. He exhibits no distension and no mass. There is no tenderness.  Musculoskeletal:  Normal range of motion. He exhibits no edema.  Lymphadenopathy:  He has no cervical adenopathy.  Neurological: He is alert and oriented to person, place, and time. He has normal strength. No cranial nerve deficit or sensory deficit.  Skin: Skin is warm and dry.  Psychiatric: He has a normal mood and affect.   Diagnostic Tests:  Physicians  Panel Physicians Referring Physician Case Authorizing Physician  Wellington Hampshire, MD (Primary)    Procedures  LEFT HEART CATH AND CORONARY ANGIOGRAPHY  Conclusion  Ost LAD  lesion, 90 %stenosed.  Prox Cx to Mid Cx lesion, 80 %stenosed.  Ost 1st Mrg to 1st Mrg lesion, 90 %stenosed. 1. Significant two-vessel coronary artery disease including ostial LAD. The coronary arteries are moderately calcified especially the LAD.  2. Normal LV systolic function by echocardiogram. Left ventricular angiography was not performed.  3. Mild gradient across the aortic valve suggestive of mild stenosis with a mean gradient of 14 mmHg.  Recommendations:  Given ostial location of the LAD stenosis as well as bifurcation disease of the left circumflex, I think the best option is CABG especially with underlying diabetes.  Aortic stenosis does not appear to be significant enough to require addressing at the present time.   Indications  Effort angina ([place]) [I20.8 (ICD-10-CM)]  Procedural Details/Technique  Technical Details Procedural Details: The right wrist was prepped, draped, and anesthetized with 1% lidocaine. Using the modified Seldinger technique, a 5 French sheath was introduced into the right radial artery. 3 mg of verapamil was administered through the sheath, weight-based unfractionated heparin was administered intravenously. A Jackie catheter was used for selective coronary angiography and LV pressure. A JL 3.5 catheter was needed to engage the left main. Catheter exchanges were performed over an exchange length guidewire. There were no immediate procedural  complications. A TR band was used for radial hemostasis at the completion of the procedure. The patient was transferred to the post catheterization recovery area for further monitoring.   Estimated blood loss <50 mL.  During this procedure the patient was administered the following to achieve and maintain moderate conscious sedation: Versed 1 mg, Fentanyl 50 mcg, while the patient's heart rate, blood pressure, and oxygen saturation were continuously monitored. The period of conscious sedation was 21 minutes, of which I was present face-to-face 100% of this time.  Coronary Findings  Diagnostic  Dominance: Right  Left Main  There is mild the vessel.  Left Anterior Descending  Ost LAD lesion 90% stenosed  The lesion is moderately calcified.  First Diagonal Branch  Vessel is small in size. The vessel exhibits minimal luminal irregularities.  Second Diagonal Branch  The vessel exhibits minimal luminal irregularities.  Third Diagonal Branch  Vessel is angiographically normal.  Lateral Third Diagonal Branch  Vessel is angiographically normal.  Ramus Intermedius  Vessel is small. The vessel exhibits minimal luminal irregularities.  Left Circumflex  Prox Cx to Mid Cx lesion 80% stenosed  Prox Cx to Mid Cx lesion.  First Obtuse Marginal Branch  Ost 1st Mrg to 1st Mrg lesion 90% stenosed  Ost 1st Mrg to 1st Mrg lesion.  Second Obtuse Marginal Branch  Vessel is angiographically normal.  Third Obtuse Marginal Branch  Vessel is angiographically normal.  Right Coronary Artery  There is mild the vessel.  Right Posterior Descending Artery  The vessel exhibits minimal luminal irregularities.  Right Posterior Atrioventricular Branch  The vessel exhibits minimal luminal irregularities.  First Right Posterolateral  The vessel exhibits minimal luminal irregularities.  Second Right Posterolateral  The vessel exhibits minimal luminal irregularities.  Intervention  No interventions have been  documented.  Coronary Diagrams  Diagnostic Diagram     Implants     No implant documentation for this case.  MERGE Images  Link to Procedure Log   Show images for CARDIAC CATHETERIZATION Procedure Log  Hemo Data   Most Recent Value  Aortic Mean Gradient 26.1 mmHg  Aortic Peak Gradient 19 mmHg  AO Systolic Pressure 382 mmHg  AO Diastolic Pressure 60 mmHg  AO Mean 94 mmHg  LV Systolic Pressure 063 mmHg  LV Diastolic Pressure 6 mmHg  LV EDP 17 mmHg  Arterial Occlusion Pressure Extended Systolic Pressure 016 mmHg  Arterial Occlusion Pressure Extended Diastolic Pressure 66 mmHg  Arterial Occlusion Pressure Extended Mean Pressure 97 mmHg  Left Ventricular Apex Extended Systolic Pressure 010 mmHg  Left Ventricular Apex Extended Diastolic Pressure 9 mmHg  Left Ventricular Apex Extended EDP Pressure 22 mmHg   Result status: Final result  *[facility]*  [address], [place] [address]  [phone]  -------------------------------------------------------------------  Transthoracic Echocardiography  Patient: Eugean, Arnott  MR #: [id number]  Study Date: 05/25/2017  Gender: M  Age: 61  Height: 160 cm  Weight: 90 kg  BSA: 2.04 m^2  Pt. Status:  Room: 6E04C  PERFORMING [facility], Inpatient  ORDERING Pricilla Holm  ADMITTING Elson Areas, Royal Hawthorn  ATTENDING Durenda Age  SONOGRAPHER Dance, Tiffany  cc:  -------------------------------------------------------------------  LV EF: 60% - 65%  -------------------------------------------------------------------  Indications: Aortic stenosis 424.1.  -------------------------------------------------------------------  History: PMH: Murmur. Risk factors: Hypertension. Diabetes  mellitus. Dyslipidemia.  -------------------------------------------------------------------  Study Conclusions  - Left ventricle: The cavity size was normal. Wall thickness was  increased  in a pattern of mild LVH. Systolic function was normal.  The estimated ejection fraction was in the range of 60% to 65%.  Wall motion was normal; there were no regional wall motion  abnormalities. Doppler parameters are consistent with abnormal  left ventricular relaxation (grade 1 diastolic dysfunction).  - Aortic valve: Mildly calcified leaflets. Mild stenosis. Mean  gradient (S): 12 mm Hg. Peak gradient (S): 22 mm Hg. Valve area  (VTI): 1.59 cm^2. Valve area (Vmax): 1.59 cm^2. Valve area  (Vmean): 1.67 cm^2.  - Mitral valve: Mildly thickened leaflets . There was mild  regurgitation.  - Left atrium: The atrium was normal in size.  - Atrial septum: No defect or patent foramen ovale was identified.  - Tricuspid valve: There was trivial regurgitation.  - Pulmonary arteries: PA peak pressure: 31 mm Hg (S).  - Inferior vena cava: The vessel was normal in size. The  respirophasic diameter changes were in the normal range (>= 50%),  consistent with normal central venous pressure.  Impressions:  - Compared to a prior study in 2016, the LVEF is unchanged at  60-65%. There is mild aortic stenosis wtih a small increase in  the mean gradient from 8 to 12 mmHg, corresponding to an AVA of  around 1.6 cm2.  -------------------------------------------------------------------  Study data: Comparison was made to the study of 07/14/2015. Study  status: Routine. Procedure: The patient reported no pain pre or  post test. Transthoracic echocardiography. Image quality was  adequate. Study completion: There were no complications.  Transthoracic echocardiography. M-mode, complete 2D, spectral  Doppler, and color Doppler. Birthdate: Patient birthdate:  [birth year]. Age: Patient is 71 yr old. Sex: Gender: male.  BMI: 35.1 kg/m^2. Blood pressure: 146/67 Patient status:  Inpatient. Study date: Study date: 05/25/2017. Study time: 02:47  PM. Location: Bedside.    -------------------------------------------------------------------  -------------------------------------------------------------------  Left ventricle: The cavity size was normal. Wall thickness was  increased in a pattern of mild LVH. Systolic function was normal.  The estimated ejection fraction was in the range of 60% to 65%.  Wall motion was normal; there were no regional wall motion  abnormalities. Doppler parameters are consistent with abnormal left  ventricular relaxation (grade 1 diastolic dysfunction). The E/e&'  ratio is >15, suggesting elevated LV  filling pressure.  -------------------------------------------------------------------  Aortic valve: Mildly calcified leaflets. Mild stenosis. Doppler:  VTI ratio of LVOT to aortic valve: 0.42. Valve area (VTI): 1.59  cm^2. Indexed valve area (VTI): 0.78 cm^2/m^2. Peak velocity ratio  of LVOT to aortic valve: 0.42. Valve area (Vmax): 1.59 cm^2.  Indexed valve area (Vmax): 0.78 cm^2/m^2. Mean velocity ratio of  LVOT to aortic valve: 0.44. Valve area (Vmean): 1.67 cm^2. Indexed  valve area (Vmean): 0.82 cm^2/m^2. Mean gradient (S): 12 mm Hg.  Peak gradient (S): 22 mm Hg.  -------------------------------------------------------------------  Aorta: Aortic root: The aortic root was normal in size.  Ascending aorta: The ascending aorta was normal in size.  -------------------------------------------------------------------  Mitral valve: Mildly thickened leaflets . Doppler: There was  mild regurgitation. Peak gradient (D): 4 mm Hg.  -------------------------------------------------------------------  Left atrium: The atrium was normal in size.  -------------------------------------------------------------------  Atrial septum: No defect or patent foramen ovale was identified.  -------------------------------------------------------------------  Right ventricle: The cavity size was normal. Wall thickness was  normal. Systolic function  was low normal.  -------------------------------------------------------------------  Pulmonic valve: The valve appears to be grossly normal.  Doppler: There was trivial regurgitation.  -------------------------------------------------------------------  Tricuspid valve: Doppler: There was trivial regurgitation.  -------------------------------------------------------------------  Pulmonary artery: The main pulmonary artery was normal-sized.  -------------------------------------------------------------------  Right atrium: The atrium was normal in size.  -------------------------------------------------------------------  Pericardium: There was no pericardial effusion.  -------------------------------------------------------------------  Systemic veins:  Inferior vena cava: The vessel was normal in size. The  respirophasic diameter changes were in the normal range (>= 50%),  consistent with normal central venous pressure.  -------------------------------------------------------------------  Measurements  Left ventricle Value Reference  LV ID, ED, PLAX chordal 44.7 mm 43 - 52  LV ID, ES, PLAX chordal 31.5 mm 23 - 38  LV fx shortening, PLAX chordal 30 % >=29  LV PW thickness, ED 12 mm ----------  IVS/LV PW ratio, ED 0.94 <=1.3  Stroke volume, 2D 90 ml ----------  Stroke volume/bsa, 2D 44 ml/m^2 ----------  LV e&', lateral 6.24 cm/s ----------  LV E/e&', lateral 15.63 ----------  LV e&', medial 5.92 cm/s ----------  LV E/e&', medial 16.47 ----------  LV e&', average 6.08 cm/s ----------  LV E/e&', average 16.04 ----------  Ventricular septum Value Reference  IVS thickness, ED 11.3 mm ----------  LVOT Value Reference  LVOT ID, S 22 mm ----------  LVOT area 3.8 cm^2 ----------  LVOT peak velocity, S 99.2 cm/s ----------  LVOT mean velocity, S 70.4 cm/s ----------  LVOT VTI, S 23.6 cm ----------  Aortic valve Value Reference  Aortic valve peak velocity, S 237 cm/s ----------    Aortic valve mean velocity, S 160 cm/s ----------  Aortic valve VTI, S 56.5 cm ----------  Aortic mean gradient, S 12 mm Hg ----------  Aortic peak gradient, S 22 mm Hg ----------  VTI ratio, LVOT/AV 0.42 ----------  Aortic valve area, VTI 1.59 cm^2 ----------  Aortic valve area/bsa, VTI 0.78 cm^2/m^2 ----------  Velocity ratio, peak, LVOT/AV 0.42 ----------  Aortic valve area, peak velocity 1.59 cm^2 ----------  Aortic valve area/bsa, peak 0.78 cm^2/m^2 ----------  velocity  Velocity ratio, mean, LVOT/AV 0.44 ----------  Aortic valve area, mean velocity 1.67 cm^2 ----------  Aortic valve area/bsa, mean 0.82 cm^2/m^2 ----------  velocity  Aorta Value Reference  Aortic root ID, ED 32 mm ----------  Ascending aorta ID, A-P, S 31 mm ----------  Left atrium Value Reference  LA ID, A-P, ES 41 mm ----------  LA ID/bsa, A-P 2.01 cm/m^2 <=2.2  LA volume, S 48.5 ml ----------  LA volume/bsa, S 23.8 ml/m^2 ----------  LA volume, ES, 1-p A4C 42.6 ml ----------  LA volume/bsa, ES, 1-p A4C 20.9 ml/m^2 ----------  LA volume, ES, 1-p A2C 50.1 ml ----------  LA volume/bsa, ES, 1-p A2C 24.6 ml/m^2 ----------  Mitral valve Value Reference  Mitral E-wave peak velocity 97.5 cm/s ----------  Mitral A-wave peak velocity 125 cm/s ----------  Mitral deceleration time (H) 250 ms 150 - 230  Mitral peak gradient, D 4 mm Hg ----------  Mitral E/A ratio, peak 0.8 ----------  Pulmonary arteries Value Reference  PA pressure, S, DP (H) 31 mm Hg <=30  Tricuspid valve Value Reference  Tricuspid regurg peak velocity 264 cm/s ----------  Tricuspid peak RV-RA gradient 28 mm Hg ----------  Right atrium Value Reference  RA ID, S-I, ES, A4C (H) 50.6 mm 34 - 49  RA area, ES, A4C 14 cm^2 8.3 - 19.5  RA volume, ES, A/L 32.6 ml ----------  RA volume/bsa, ES, A/L 16 ml/m^2 ----------  Right ventricle Value Reference  RV ID, minor axis, ED, A4C base 28.8 mm ----------  TAPSE 26.7 mm ----------  RV s&', lateral, S  9.81 cm/s ----------  Pulmonic valve Value Reference  Pulmonic regurg velocity, ED 92.3 cm/s ----------  Legend:  (L) and (H) mark values outside specified reference range.  -------------------------------------------------------------------  Prepared and Electronically Authenticated by  Lyman Bishop MD  2018-10-31T17:00:21    Impression:   This 70 year old gentleman has severe two-vessel coronary disease with exertional shortness of breath and fatigue as well as mild aortic stenosis. I agree that given the ostial location of his LAD stenosis as well as the bifurcation disease in the left circumflex coronary artery and the presence of diabetes, coronary bypass graft surgery is the best treatment. His aortic stenosis is mild and does not require treatment at this time but will require continued follow-up. I reviewed the cardiac catheterization and echocardiogram studies with the patient and answered his questions. I discussed the operative procedure with the patient including alternatives, benefits and risks; including but not limited to bleeding, blood transfusion, infection, stroke, myocardial infarction, graft failure, heart block requiring a permanent pacemaker, organ dysfunction, and death. [name] understands and agrees to proceed.   Plan:   Coronary artery bypass graft surgery  Gaye Pollack, MD  [facility]  [phone]

## 2017-06-24 ENCOUNTER — Inpatient Hospital Stay (HOSPITAL_COMMUNITY)
Admission: RE | Admit: 2017-06-24 | Discharge: 2017-06-29 | DRG: 236 | Disposition: A | Discharge status: Home / Self Care | Payer: Medicare Other | Source: Ambulatory Visit | Attending: Surgery | Admitting: Surgery

## 2017-06-24 ENCOUNTER — Inpatient Hospital Stay (HOSPITAL_COMMUNITY): Payer: Medicare Other | Admitting: Anesthesiology

## 2017-06-24 ENCOUNTER — Inpatient Hospital Stay (HOSPITAL_COMMUNITY): Payer: Medicare Other | Admitting: Vascular Surgery

## 2017-06-24 ENCOUNTER — Encounter (HOSPITAL_COMMUNITY)
Admission: RE | Disposition: A | Discharge status: Home / Self Care | Payer: Self-pay | Source: Ambulatory Visit | Attending: Surgery

## 2017-06-24 ENCOUNTER — Inpatient Hospital Stay (HOSPITAL_COMMUNITY): Payer: Medicare Other

## 2017-06-24 DIAGNOSIS — Z4682 Encounter for fitting and adjustment of non-vascular catheter: Secondary | ICD-10-CM | POA: Diagnosis not present

## 2017-06-24 DIAGNOSIS — Z09 Encounter for follow-up examination after completed treatment for conditions other than malignant neoplasm: Secondary | ICD-10-CM

## 2017-06-24 DIAGNOSIS — Z79899 Other long term (current) drug therapy: Secondary | ICD-10-CM | POA: Diagnosis not present

## 2017-06-24 DIAGNOSIS — I25700 Atherosclerosis of coronary artery bypass graft(s), unspecified, with unstable angina pectoris: Secondary | ICD-10-CM | POA: Diagnosis not present

## 2017-06-24 DIAGNOSIS — E8770 Fluid overload, unspecified: Secondary | ICD-10-CM | POA: Diagnosis not present

## 2017-06-24 DIAGNOSIS — Z833 Family history of diabetes mellitus: Secondary | ICD-10-CM | POA: Diagnosis not present

## 2017-06-24 DIAGNOSIS — Z8249 Family history of ischemic heart disease and other diseases of the circulatory system: Secondary | ICD-10-CM

## 2017-06-24 DIAGNOSIS — I2510 Atherosclerotic heart disease of native coronary artery without angina pectoris: Principal | ICD-10-CM | POA: Diagnosis present

## 2017-06-24 DIAGNOSIS — E1165 Type 2 diabetes mellitus with hyperglycemia: Secondary | ICD-10-CM | POA: Diagnosis present

## 2017-06-24 DIAGNOSIS — Z7982 Long term (current) use of aspirin: Secondary | ICD-10-CM | POA: Diagnosis not present

## 2017-06-24 DIAGNOSIS — Z7984 Long term (current) use of oral hypoglycemic drugs: Secondary | ICD-10-CM

## 2017-06-24 DIAGNOSIS — Z87891 Personal history of nicotine dependence: Secondary | ICD-10-CM

## 2017-06-24 DIAGNOSIS — I4891 Unspecified atrial fibrillation: Secondary | ICD-10-CM | POA: Diagnosis not present

## 2017-06-24 DIAGNOSIS — I350 Nonrheumatic aortic (valve) stenosis: Secondary | ICD-10-CM | POA: Diagnosis present

## 2017-06-24 DIAGNOSIS — Z823 Family history of stroke: Secondary | ICD-10-CM

## 2017-06-24 DIAGNOSIS — N183 Chronic kidney disease, stage 3 (moderate): Secondary | ICD-10-CM | POA: Diagnosis present

## 2017-06-24 DIAGNOSIS — E1122 Type 2 diabetes mellitus with diabetic chronic kidney disease: Secondary | ICD-10-CM | POA: Diagnosis present

## 2017-06-24 DIAGNOSIS — I471 Supraventricular tachycardia: Secondary | ICD-10-CM | POA: Diagnosis present

## 2017-06-24 DIAGNOSIS — Z9689 Presence of other specified functional implants: Secondary | ICD-10-CM

## 2017-06-24 DIAGNOSIS — G4733 Obstructive sleep apnea (adult) (pediatric): Secondary | ICD-10-CM | POA: Diagnosis present

## 2017-06-24 DIAGNOSIS — E7800 Pure hypercholesterolemia, unspecified: Secondary | ICD-10-CM | POA: Diagnosis present

## 2017-06-24 DIAGNOSIS — E785 Hyperlipidemia, unspecified: Secondary | ICD-10-CM | POA: Diagnosis present

## 2017-06-24 DIAGNOSIS — Z951 Presence of aortocoronary bypass graft: Secondary | ICD-10-CM

## 2017-06-24 DIAGNOSIS — I129 Hypertensive chronic kidney disease with stage 1 through stage 4 chronic kidney disease, or unspecified chronic kidney disease: Secondary | ICD-10-CM | POA: Diagnosis present

## 2017-06-24 DIAGNOSIS — J9811 Atelectasis: Secondary | ICD-10-CM | POA: Diagnosis not present

## 2017-06-24 DIAGNOSIS — I080 Rheumatic disorders of both mitral and aortic valves: Secondary | ICD-10-CM | POA: Diagnosis not present

## 2017-06-24 HISTORY — PX: CORONARY ARTERY BYPASS GRAFT: SHX141

## 2017-06-24 HISTORY — PX: TEE WITHOUT CARDIOVERSION: SHX5443

## 2017-06-24 LAB — HEMOGLOBIN AND HEMATOCRIT, BLOOD
HCT: 29.6 % — ABNORMAL LOW (ref 39.0–52.0)
Hemoglobin: 10.2 g/dL — ABNORMAL LOW (ref 13.0–17.0)

## 2017-06-24 LAB — POCT I-STAT, CHEM 8
BUN: 20 mg/dL (ref 6–20)
BUN: 16 mg/dL (ref 6–20)
BUN: 19 mg/dL (ref 6–20)
BUN: 18 mg/dL (ref 6–20)
BUN: 18 mg/dL (ref 6–20)
BUN: 17 mg/dL (ref 6–20)
CALCIUM ION: 1.17 mmol/L (ref 1.15–1.40)
CALCIUM ION: 0.91 mmol/L — AB (ref 1.15–1.40)
CALCIUM ION: 1.15 mmol/L (ref 1.15–1.40)
CHLORIDE: 104 mmol/L (ref 101–111)
CHLORIDE: 105 mmol/L (ref 101–111)
CHLORIDE: 105 mmol/L (ref 101–111)
CREATININE: 1.3 mg/dL — AB (ref 0.61–1.24)
CREATININE: 1.4 mg/dL — AB (ref 0.61–1.24)
CREATININE: 1.3 mg/dL — AB (ref 0.61–1.24)
Calcium, Ion: 1.14 mmol/L — ABNORMAL LOW (ref 1.15–1.40)
Calcium, Ion: 1.06 mmol/L — ABNORMAL LOW (ref 1.15–1.40)
Calcium, Ion: 1.05 mmol/L — ABNORMAL LOW (ref 1.15–1.40)
Chloride: 104 mmol/L (ref 101–111)
Chloride: 101 mmol/L (ref 101–111)
Chloride: 105 mmol/L (ref 101–111)
Creatinine, Ser: 1.2 mg/dL (ref 0.61–1.24)
Creatinine, Ser: 1.2 mg/dL (ref 0.61–1.24)
Creatinine, Ser: 1.2 mg/dL (ref 0.61–1.24)
GLUCOSE: 197 mg/dL — AB (ref 65–99)
GLUCOSE: 149 mg/dL — AB (ref 65–99)
GLUCOSE: 174 mg/dL — AB (ref 65–99)
GLUCOSE: 156 mg/dL — AB (ref 65–99)
Glucose, Bld: 216 mg/dL — ABNORMAL HIGH (ref 65–99)
Glucose, Bld: 209 mg/dL — ABNORMAL HIGH (ref 65–99)
HCT: 32 % — ABNORMAL LOW (ref 39.0–52.0)
HCT: 24 % — ABNORMAL LOW (ref 39.0–52.0)
HCT: 29 % — ABNORMAL LOW (ref 39.0–52.0)
HEMATOCRIT: 32 % — AB (ref 39.0–52.0)
HEMATOCRIT: 27 % — AB (ref 39.0–52.0)
HEMATOCRIT: 26 % — AB (ref 39.0–52.0)
HEMOGLOBIN: 10.9 g/dL — AB (ref 13.0–17.0)
HEMOGLOBIN: 10.9 g/dL — AB (ref 13.0–17.0)
HEMOGLOBIN: 8.2 g/dL — AB (ref 13.0–17.0)
HEMOGLOBIN: 9.2 g/dL — AB (ref 13.0–17.0)
Hemoglobin: 8.8 g/dL — ABNORMAL LOW (ref 13.0–17.0)
Hemoglobin: 9.9 g/dL — ABNORMAL LOW (ref 13.0–17.0)
POTASSIUM: 4.2 mmol/L (ref 3.5–5.1)
POTASSIUM: 3.7 mmol/L (ref 3.5–5.1)
POTASSIUM: 3.9 mmol/L (ref 3.5–5.1)
POTASSIUM: 4.4 mmol/L (ref 3.5–5.1)
POTASSIUM: 3.6 mmol/L (ref 3.5–5.1)
Potassium: 4.2 mmol/L (ref 3.5–5.1)
SODIUM: 143 mmol/L (ref 135–145)
SODIUM: 142 mmol/L (ref 135–145)
Sodium: 142 mmol/L (ref 135–145)
Sodium: 142 mmol/L (ref 135–145)
Sodium: 141 mmol/L (ref 135–145)
Sodium: 143 mmol/L (ref 135–145)
TCO2: 29 mmol/L (ref 22–32)
TCO2: 28 mmol/L (ref 22–32)
TCO2: 29 mmol/L (ref 22–32)
TCO2: 29 mmol/L (ref 22–32)
TCO2: 28 mmol/L (ref 22–32)
TCO2: 24 mmol/L (ref 22–32)

## 2017-06-24 LAB — ECHO TEE
AV Peak grad: 22 mmHg
AVG: 11 mmHg
Valve area: 1.17 cm2

## 2017-06-24 LAB — POCT I-STAT 3, ART BLOOD GAS (G3+)
ACID-BASE EXCESS: 3 mmol/L — AB (ref 0.0–2.0)
ACID-BASE EXCESS: 1 mmol/L (ref 0.0–2.0)
Acid-Base Excess: 4 mmol/L — ABNORMAL HIGH (ref 0.0–2.0)
Acid-Base Excess: 2 mmol/L (ref 0.0–2.0)
Acid-Base Excess: 1 mmol/L (ref 0.0–2.0)
Acid-Base Excess: 1 mmol/L (ref 0.0–2.0)
BICARBONATE: 28.7 mmol/L — AB (ref 20.0–28.0)
BICARBONATE: 25.8 mmol/L (ref 20.0–28.0)
BICARBONATE: 26.2 mmol/L (ref 20.0–28.0)
Bicarbonate: 27.9 mmol/L (ref 20.0–28.0)
Bicarbonate: 25.7 mmol/L (ref 20.0–28.0)
Bicarbonate: 26.2 mmol/L (ref 20.0–28.0)
O2 Saturation: 100 %
O2 Saturation: 100 %
O2 Saturation: 100 %
O2 Saturation: 98 %
O2 Saturation: 93 %
O2 Saturation: 94 %
PCO2 ART: 34.6 mmHg (ref 32.0–48.0)
PH ART: 7.406 (ref 7.350–7.450)
PH ART: 7.501 — AB (ref 7.350–7.450)
PH ART: 7.481 — AB (ref 7.350–7.450)
PO2 ART: 277 mmHg — AB (ref 83.0–108.0)
PO2 ART: 77 mmHg — AB (ref 83.0–108.0)
Patient temperature: 35.9
Patient temperature: 37.3
TCO2: 30 mmol/L (ref 22–32)
TCO2: 29 mmol/L (ref 22–32)
TCO2: 27 mmol/L (ref 22–32)
TCO2: 27 mmol/L (ref 22–32)
TCO2: 28 mmol/L (ref 22–32)
TCO2: 28 mmol/L (ref 22–32)
pCO2 arterial: 45.7 mmHg (ref 32.0–48.0)
pCO2 arterial: 35.7 mmHg (ref 32.0–48.0)
pCO2 arterial: 38.1 mmHg (ref 32.0–48.0)
pCO2 arterial: 45.1 mmHg (ref 32.0–48.0)
pCO2 arterial: 46.4 mmHg (ref 32.0–48.0)
pH, Arterial: 7.433 (ref 7.350–7.450)
pH, Arterial: 7.374 (ref 7.350–7.450)
pH, Arterial: 7.361 (ref 7.350–7.450)
pO2, Arterial: 298 mmHg — ABNORMAL HIGH (ref 83.0–108.0)
pO2, Arterial: 330 mmHg — ABNORMAL HIGH (ref 83.0–108.0)
pO2, Arterial: 93 mmHg (ref 83.0–108.0)
pO2, Arterial: 72 mmHg — ABNORMAL LOW (ref 83.0–108.0)

## 2017-06-24 LAB — CBC
HCT: 30.7 % — ABNORMAL LOW (ref 39.0–52.0)
HEMATOCRIT: 27 % — AB (ref 39.0–52.0)
HEMOGLOBIN: 10.5 g/dL — AB (ref 13.0–17.0)
Hemoglobin: 9.4 g/dL — ABNORMAL LOW (ref 13.0–17.0)
MCH: 30.7 pg (ref 26.0–34.0)
MCH: 30 pg (ref 26.0–34.0)
MCHC: 34.8 g/dL (ref 30.0–36.0)
MCHC: 34.2 g/dL (ref 30.0–36.0)
MCV: 88.2 fL (ref 78.0–100.0)
MCV: 87.7 fL (ref 78.0–100.0)
PLATELETS: 120 10*3/uL — AB (ref 150–400)
PLATELETS: 153 10*3/uL (ref 150–400)
RBC: 3.06 MIL/uL — AB (ref 4.22–5.81)
RBC: 3.5 MIL/uL — AB (ref 4.22–5.81)
RDW: 13.4 % (ref 11.5–15.5)
RDW: 13 % (ref 11.5–15.5)
WBC: 6.6 10*3/uL (ref 4.0–10.5)
WBC: 10.7 10*3/uL — ABNORMAL HIGH (ref 4.0–10.5)

## 2017-06-24 LAB — GLUCOSE, CAPILLARY
GLUCOSE-CAPILLARY: 169 mg/dL — AB (ref 65–99)
GLUCOSE-CAPILLARY: 108 mg/dL — AB (ref 65–99)
GLUCOSE-CAPILLARY: 121 mg/dL — AB (ref 65–99)
GLUCOSE-CAPILLARY: 101 mg/dL — AB (ref 65–99)
Glucose-Capillary: 136 mg/dL — ABNORMAL HIGH (ref 65–99)
Glucose-Capillary: 103 mg/dL — ABNORMAL HIGH (ref 65–99)
Glucose-Capillary: 76 mg/dL (ref 65–99)
Glucose-Capillary: 97 mg/dL (ref 65–99)
Glucose-Capillary: 114 mg/dL — ABNORMAL HIGH (ref 65–99)

## 2017-06-24 LAB — CREATININE, SERUM
CREATININE: 1.42 mg/dL — AB (ref 0.61–1.24)
GFR calc Af Amer: 56 mL/min — ABNORMAL LOW (ref >60)
GFR, EST NON AFRICAN AMERICAN: 49 mL/min — AB (ref >60)

## 2017-06-24 LAB — POCT I-STAT 4, (NA,K, GLUC, HGB,HCT)
GLUCOSE: 125 mg/dL — AB (ref 65–99)
HCT: 21 % — ABNORMAL LOW (ref 39.0–52.0)
Hemoglobin: 7.1 g/dL — ABNORMAL LOW (ref 13.0–17.0)
POTASSIUM: 2.6 mmol/L — AB (ref 3.5–5.1)
SODIUM: 147 mmol/L — AB (ref 135–145)

## 2017-06-24 LAB — PROTIME-INR
INR: 1.35
Prothrombin Time: 16.5 seconds — ABNORMAL HIGH (ref 11.4–15.2)

## 2017-06-24 LAB — APTT: APTT: 38 s — AB (ref 24–36)

## 2017-06-24 LAB — PLATELET COUNT: PLATELETS: 163 10*3/uL (ref 150–400)

## 2017-06-24 LAB — POTASSIUM: POTASSIUM: 3.5 mmol/L (ref 3.5–5.1)

## 2017-06-24 SURGERY — CORONARY ARTERY BYPASS GRAFTING (CABG)
Anesthesia: General | Site: Chest

## 2017-06-24 MED ORDER — VANCOMYCIN HCL IN DEXTROSE 1-5 GM/200ML-% IV SOLN
1000.0000 mg | Freq: Once | INTRAVENOUS | Status: AC
  Administered 2017-06-24: 1000 mg via INTRAVENOUS
  Filled 2017-06-24: qty 200

## 2017-06-24 MED ORDER — PROPOFOL 10 MG/ML IV BOLUS
INTRAVENOUS | Status: AC
  Filled 2017-06-24: qty 20

## 2017-06-24 MED ORDER — PROTAMINE SULFATE 10 MG/ML IV SOLN
INTRAVENOUS | Status: DC | PRN
  Administered 2017-06-24: 300 mg via INTRAVENOUS

## 2017-06-24 MED ORDER — DOCUSATE SODIUM 100 MG PO CAPS
200.0000 mg | ORAL_CAPSULE | Freq: Every day | ORAL | Status: DC
  Administered 2017-06-25 – 2017-06-27 (×3): 200 mg via ORAL
  Filled 2017-06-24 (×3): qty 2

## 2017-06-24 MED ORDER — METOPROLOL TARTRATE 12.5 MG HALF TABLET
12.5000 mg | ORAL_TABLET | Freq: Two times a day (BID) | ORAL | Status: DC
  Administered 2017-06-24 – 2017-06-26 (×5): 12.5 mg via ORAL
  Filled 2017-06-24 (×5): qty 1

## 2017-06-24 MED ORDER — POTASSIUM CHLORIDE 10 MEQ/50ML IV SOLN
10.0000 meq | INTRAVENOUS | Status: AC
  Administered 2017-06-24 (×3): 10 meq via INTRAVENOUS

## 2017-06-24 MED ORDER — PHENYLEPHRINE HCL 10 MG/ML IJ SOLN
INTRAMUSCULAR | Status: DC | PRN
  Administered 2017-06-24: 20 ug/min via INTRAVENOUS

## 2017-06-24 MED ORDER — 0.9 % SODIUM CHLORIDE (POUR BTL) OPTIME
TOPICAL | Status: DC | PRN
  Administered 2017-06-24 (×6): 1000 mL

## 2017-06-24 MED ORDER — ROCURONIUM BROMIDE 10 MG/ML (PF) SYRINGE
PREFILLED_SYRINGE | INTRAVENOUS | Status: DC | PRN
  Administered 2017-06-24 (×3): 50 mg via INTRAVENOUS

## 2017-06-24 MED ORDER — ACETAMINOPHEN 500 MG PO TABS
1000.0000 mg | ORAL_TABLET | Freq: Four times a day (QID) | ORAL | Status: DC
  Administered 2017-06-24 – 2017-06-27 (×10): 1000 mg via ORAL
  Filled 2017-06-24 (×10): qty 2

## 2017-06-24 MED ORDER — PROTAMINE SULFATE 10 MG/ML IV SOLN
INTRAVENOUS | Status: AC
  Filled 2017-06-24: qty 5

## 2017-06-24 MED ORDER — LIDOCAINE 2% (20 MG/ML) 5 ML SYRINGE
INTRAMUSCULAR | Status: AC
  Filled 2017-06-24: qty 5

## 2017-06-24 MED ORDER — HEMOSTATIC AGENTS (NO CHARGE) OPTIME
TOPICAL | Status: DC | PRN
  Administered 2017-06-24: 1 via TOPICAL

## 2017-06-24 MED ORDER — SODIUM CHLORIDE 0.9 % IV SOLN: 250.0000 mL | INTRAVENOUS | Status: DC

## 2017-06-24 MED ORDER — FENTANYL CITRATE (PF) 250 MCG/5ML IJ SOLN
INTRAMUSCULAR | Status: AC
  Filled 2017-06-24: qty 5

## 2017-06-24 MED ORDER — SODIUM CHLORIDE 0.9 % IV SOLN
INTRAVENOUS | Status: DC
  Filled 2017-06-24: qty 1

## 2017-06-24 MED ORDER — ASPIRIN 81 MG PO CHEW: 324.0000 mg | CHEWABLE_TABLET | Freq: Every day | ORAL | Status: DC

## 2017-06-24 MED ORDER — ALBUMIN HUMAN 5 % IV SOLN: 250.0000 mL | INTRAVENOUS | Status: AC | PRN

## 2017-06-24 MED ORDER — THROMBIN (RECOMBINANT) 20000 UNITS EX SOLR
CUTANEOUS | Status: DC | PRN
  Administered 2017-06-24 (×3): 5000 [IU] via TOPICAL

## 2017-06-24 MED ORDER — BISACODYL 5 MG PO TBEC
10.0000 mg | DELAYED_RELEASE_TABLET | Freq: Every day | ORAL | Status: DC
  Administered 2017-06-25 – 2017-06-27 (×2): 10 mg via ORAL
  Filled 2017-06-24 (×2): qty 2

## 2017-06-24 MED ORDER — SODIUM CHLORIDE 0.45 % IV SOLN
INTRAVENOUS | Status: DC | PRN
Start: 2017-06-24 — End: 2017-06-27
  Administered 2017-06-24: 13:00:00 via INTRAVENOUS

## 2017-06-24 MED ORDER — LACTATED RINGERS IV SOLN: INTRAVENOUS | Status: DC

## 2017-06-24 MED ORDER — SODIUM CHLORIDE 0.9 % IJ SOLN
OROMUCOSAL | Status: DC | PRN
  Administered 2017-06-24 (×3): 4 mL via TOPICAL

## 2017-06-24 MED ORDER — SUCCINYLCHOLINE CHLORIDE 200 MG/10ML IV SOSY
PREFILLED_SYRINGE | INTRAVENOUS | Status: AC
  Filled 2017-06-24: qty 10

## 2017-06-24 MED ORDER — MORPHINE SULFATE (PF) 2 MG/ML IV SOLN: 1.0000 mg | INTRAVENOUS | Status: AC | PRN

## 2017-06-24 MED ORDER — SODIUM CHLORIDE 0.9 % IV SOLN: 0.0000 ug/kg/h | INTRAVENOUS | Status: DC

## 2017-06-24 MED ORDER — NITROGLYCERIN 0.2 MG/ML ON CALL CATH LAB
INTRAVENOUS | Status: DC | PRN
  Administered 2017-06-24 (×2): 60 mg via INTRAVENOUS

## 2017-06-24 MED ORDER — CHLORHEXIDINE GLUCONATE 0.12% ORAL RINSE (MEDLINE KIT)
15.0000 mL | Freq: Two times a day (BID) | OROMUCOSAL | Status: DC
  Administered 2017-06-24: 15 mL via OROMUCOSAL

## 2017-06-24 MED ORDER — ROCURONIUM BROMIDE 10 MG/ML (PF) SYRINGE
PREFILLED_SYRINGE | INTRAVENOUS | Status: AC
  Filled 2017-06-24: qty 5

## 2017-06-24 MED ORDER — LEVOFLOXACIN IN D5W 750 MG/150ML IV SOLN
750.0000 mg | INTRAVENOUS | Status: AC
  Administered 2017-06-25: 750 mg via INTRAVENOUS
  Filled 2017-06-24: qty 150

## 2017-06-24 MED ORDER — METOPROLOL TARTRATE 5 MG/5ML IV SOLN
2.5000 mg | INTRAVENOUS | Status: DC | PRN
  Administered 2017-06-24: 5 mg via INTRAVENOUS
  Filled 2017-06-24: qty 5

## 2017-06-24 MED ORDER — PHENYLEPHRINE 40 MCG/ML (10ML) SYRINGE FOR IV PUSH (FOR BLOOD PRESSURE SUPPORT)
PREFILLED_SYRINGE | INTRAVENOUS | Status: AC
  Filled 2017-06-24: qty 10

## 2017-06-24 MED ORDER — ONDANSETRON HCL 4 MG/2ML IJ SOLN
4.0000 mg | Freq: Four times a day (QID) | INTRAMUSCULAR | Status: DC | PRN
  Administered 2017-06-24: 4 mg via INTRAVENOUS
  Filled 2017-06-24 (×2): qty 2

## 2017-06-24 MED ORDER — CHLORHEXIDINE GLUCONATE 4 % EX LIQD: 30.0000 mL | CUTANEOUS | Status: DC

## 2017-06-24 MED ORDER — NITROGLYCERIN IN D5W 200-5 MCG/ML-% IV SOLN: 0.0000 ug/min | INTRAVENOUS | Status: DC

## 2017-06-24 MED ORDER — LACTATED RINGERS IV SOLN
INTRAVENOUS | Status: DC | PRN
  Administered 2017-06-24: 07:00:00 via INTRAVENOUS

## 2017-06-24 MED ORDER — ALBUMIN HUMAN 5 % IV SOLN
INTRAVENOUS | Status: DC | PRN
  Administered 2017-06-24: 12:00:00 via INTRAVENOUS

## 2017-06-24 MED ORDER — MORPHINE SULFATE (PF) 4 MG/ML IV SOLN
1.0000 mg | INTRAVENOUS | Status: DC | PRN
  Administered 2017-06-24 – 2017-06-25 (×5): 4 mg via INTRAVENOUS
  Filled 2017-06-24 (×5): qty 1

## 2017-06-24 MED ORDER — HEPARIN SODIUM (PORCINE) 1000 UNIT/ML IJ SOLN
INTRAMUSCULAR | Status: DC | PRN
  Administered 2017-06-24: 30000 [IU] via INTRAVENOUS

## 2017-06-24 MED ORDER — CHLORHEXIDINE GLUCONATE 0.12 % MT SOLN
15.0000 mL | OROMUCOSAL | Status: AC
  Administered 2017-06-24: 15 mL via OROMUCOSAL

## 2017-06-24 MED ORDER — MIDAZOLAM HCL 10 MG/2ML IJ SOLN
INTRAMUSCULAR | Status: AC
  Filled 2017-06-24: qty 2

## 2017-06-24 MED ORDER — HEPARIN SODIUM (PORCINE) 1000 UNIT/ML IJ SOLN
INTRAMUSCULAR | Status: AC
  Filled 2017-06-24: qty 1

## 2017-06-24 MED ORDER — ACETAMINOPHEN 160 MG/5ML PO SOLN: 650.0000 mg | Freq: Once | ORAL | Status: AC

## 2017-06-24 MED ORDER — SODIUM CHLORIDE 0.9 % IV SOLN
INTRAVENOUS | Status: DC
  Administered 2017-06-24: 13:00:00 via INTRAVENOUS

## 2017-06-24 MED ORDER — FENTANYL CITRATE (PF) 250 MCG/5ML IJ SOLN
INTRAMUSCULAR | Status: DC | PRN
  Administered 2017-06-24: 250 ug via INTRAVENOUS
  Administered 2017-06-24: 50 ug via INTRAVENOUS
  Administered 2017-06-24: 150 ug via INTRAVENOUS
  Administered 2017-06-24 (×4): 250 ug via INTRAVENOUS
  Administered 2017-06-24: 200 ug via INTRAVENOUS
  Administered 2017-06-24: 250 ug via INTRAVENOUS

## 2017-06-24 MED ORDER — ATORVASTATIN CALCIUM 80 MG PO TABS
80.0000 mg | ORAL_TABLET | Freq: Every day | ORAL | Status: DC
  Administered 2017-06-25 – 2017-06-28 (×4): 80 mg via ORAL
  Filled 2017-06-24 (×4): qty 1

## 2017-06-24 MED ORDER — SODIUM CHLORIDE 0.9% FLUSH: 3.0000 mL | INTRAVENOUS | Status: DC | PRN

## 2017-06-24 MED ORDER — ACETAMINOPHEN 650 MG RE SUPP
650.0000 mg | Freq: Once | RECTAL | Status: AC
  Administered 2017-06-24: 650 mg via RECTAL

## 2017-06-24 MED ORDER — THROMBIN (RECOMBINANT) 5000 UNITS EX SOLR
CUTANEOUS | Status: AC
  Filled 2017-06-24: qty 15000

## 2017-06-24 MED ORDER — ACETAMINOPHEN 160 MG/5ML PO SOLN: 1000.0000 mg | Freq: Four times a day (QID) | ORAL | Status: DC

## 2017-06-24 MED ORDER — MAGNESIUM SULFATE 4 GM/100ML IV SOLN
4.0000 g | Freq: Once | INTRAVENOUS | Status: AC
  Administered 2017-06-24: 4 g via INTRAVENOUS
  Filled 2017-06-24: qty 100

## 2017-06-24 MED ORDER — FAMOTIDINE IN NACL 20-0.9 MG/50ML-% IV SOLN
20.0000 mg | Freq: Two times a day (BID) | INTRAVENOUS | Status: DC
  Administered 2017-06-24: 20 mg via INTRAVENOUS

## 2017-06-24 MED ORDER — PANTOPRAZOLE SODIUM 40 MG PO TBEC
40.0000 mg | DELAYED_RELEASE_TABLET | Freq: Every day | ORAL | Status: DC
  Administered 2017-06-26 – 2017-06-27 (×2): 40 mg via ORAL
  Filled 2017-06-24 (×2): qty 1

## 2017-06-24 MED ORDER — METOPROLOL TARTRATE 25 MG/10 ML ORAL SUSPENSION: 12.5000 mg | Freq: Two times a day (BID) | ORAL | Status: DC

## 2017-06-24 MED ORDER — MIDAZOLAM HCL 5 MG/5ML IJ SOLN
INTRAMUSCULAR | Status: DC | PRN
  Administered 2017-06-24: 2 mg via INTRAVENOUS
  Administered 2017-06-24: 5 mg via INTRAVENOUS
  Administered 2017-06-24: 3 mg via INTRAVENOUS

## 2017-06-24 MED ORDER — SUCCINYLCHOLINE CHLORIDE 20 MG/ML IJ SOLN
INTRAMUSCULAR | Status: DC | PRN
  Administered 2017-06-24: 120 mg via INTRAVENOUS

## 2017-06-24 MED ORDER — LACTATED RINGERS IV SOLN: 500.0000 mL | Freq: Once | INTRAVENOUS | Status: DC | PRN

## 2017-06-24 MED ORDER — BISACODYL 10 MG RE SUPP: 10.0000 mg | Freq: Every day | RECTAL | Status: DC

## 2017-06-24 MED ORDER — PROPOFOL 10 MG/ML IV BOLUS
INTRAVENOUS | Status: DC | PRN
  Administered 2017-06-24: 50 mg via INTRAVENOUS

## 2017-06-24 MED ORDER — TRAMADOL HCL 50 MG PO TABS
50.0000 mg | ORAL_TABLET | ORAL | Status: DC | PRN
  Administered 2017-06-25: 100 mg via ORAL
  Filled 2017-06-24: qty 2

## 2017-06-24 MED ORDER — ORAL CARE MOUTH RINSE
15.0000 mL | OROMUCOSAL | Status: DC
  Administered 2017-06-24 (×3): 15 mL via OROMUCOSAL

## 2017-06-24 MED ORDER — EPHEDRINE 5 MG/ML INJ
INTRAVENOUS | Status: AC
  Filled 2017-06-24: qty 10

## 2017-06-24 MED ORDER — DOPAMINE-DEXTROSE 3.2-5 MG/ML-% IV SOLN: 2.0000 ug/kg/min | INTRAVENOUS | Status: DC

## 2017-06-24 MED ORDER — MORPHINE SULFATE (PF) 2 MG/ML IV SOLN: 2.0000 mg | INTRAVENOUS | Status: DC | PRN

## 2017-06-24 MED ORDER — INSULIN REGULAR BOLUS VIA INFUSION
0.0000 [IU] | Freq: Three times a day (TID) | INTRAVENOUS | Status: DC
  Filled 2017-06-24: qty 10

## 2017-06-24 MED ORDER — MIDAZOLAM HCL 2 MG/2ML IJ SOLN
2.0000 mg | INTRAMUSCULAR | Status: DC | PRN
Start: 2017-06-24 — End: 2017-06-27

## 2017-06-24 MED ORDER — OXYCODONE HCL 5 MG PO TABS
5.0000 mg | ORAL_TABLET | ORAL | Status: DC | PRN
  Administered 2017-06-27 (×2): 5 mg via ORAL
  Filled 2017-06-24 (×2): qty 1

## 2017-06-24 MED ORDER — SODIUM CHLORIDE 0.9 % IV SOLN
0.0000 ug/min | INTRAVENOUS | Status: DC
  Filled 2017-06-24: qty 2

## 2017-06-24 MED ORDER — SODIUM CHLORIDE 0.9% FLUSH
3.0000 mL | Freq: Two times a day (BID) | INTRAVENOUS | Status: DC
  Administered 2017-06-25: 3 mL via INTRAVENOUS
  Administered 2017-06-25: 10:00:00 via INTRAVENOUS
  Administered 2017-06-26 – 2017-06-27 (×3): 3 mL via INTRAVENOUS

## 2017-06-24 MED ORDER — ASPIRIN EC 325 MG PO TBEC
325.0000 mg | DELAYED_RELEASE_TABLET | Freq: Every day | ORAL | Status: DC
  Administered 2017-06-25 – 2017-06-27 (×3): 325 mg via ORAL
  Filled 2017-06-24 (×3): qty 1

## 2017-06-24 MED ORDER — FENTANYL CITRATE (PF) 250 MCG/5ML IJ SOLN
INTRAMUSCULAR | Status: AC
  Filled 2017-06-24: qty 30

## 2017-06-24 SURGICAL SUPPLY — 116 items
ADH SKN CLS APL DERMABOND .7 (GAUZE/BANDAGES/DRESSINGS) ×1
BAG DECANTER FOR FLEXI CONT (MISCELLANEOUS) ×4 IMPLANT
BANDAGE ACE 4X5 VEL STRL LF (GAUZE/BANDAGES/DRESSINGS) ×4 IMPLANT
BANDAGE ACE 6X5 VEL STRL LF (GAUZE/BANDAGES/DRESSINGS) ×4 IMPLANT
BASKET HEART  (ORDER IN 25'S) (MISCELLANEOUS) ×1
BASKET HEART (ORDER IN 25'S) (MISCELLANEOUS) ×1
BASKET HEART (ORDER IN 25S) (MISCELLANEOUS) ×2 IMPLANT
BLADE STERNUM SYSTEM 6 (BLADE) ×4 IMPLANT
BNDG GAUZE ELAST 4 BULKY (GAUZE/BANDAGES/DRESSINGS) ×4 IMPLANT
CANISTER SUCT 3000ML PPV (MISCELLANEOUS) ×4 IMPLANT
CATH ROBINSON RED A/P 18FR (CATHETERS) ×8 IMPLANT
CATH THORACIC 28FR (CATHETERS) ×4 IMPLANT
CATH THORACIC 36FR (CATHETERS) ×4 IMPLANT
CATH THORACIC 36FR RT ANG (CATHETERS) ×4 IMPLANT
CLIP VESOCCLUDE MED 24/CT (CLIP) ×4 IMPLANT
CLIP VESOCCLUDE MED LG 24/CT (CLIP) ×4 IMPLANT
CLIP VESOCCLUDE SM WIDE 24/CT (CLIP) IMPLANT
CLOSURE STERI-STRIP 1/2X4 (GAUZE/BANDAGES/DRESSINGS) ×1
CLSR STERI-STRIP ANTIMIC 1/2X4 (GAUZE/BANDAGES/DRESSINGS) ×3 IMPLANT
CRADLE DONUT ADULT HEAD (MISCELLANEOUS) ×4 IMPLANT
DERMABOND ADVANCED (GAUZE/BANDAGES/DRESSINGS) ×2
DERMABOND ADVANCED .7 DNX12 (GAUZE/BANDAGES/DRESSINGS) ×2 IMPLANT
DRAPE CARDIOVASCULAR INCISE (DRAPES) ×2
DRAPE SLUSH/WARMER DISC (DRAPES) ×4 IMPLANT
DRAPE SRG 135X102X78XABS (DRAPES) ×2 IMPLANT
DRSG COVADERM 4X14 (GAUZE/BANDAGES/DRESSINGS) ×4 IMPLANT
ELECT CAUTERY BLADE 6.4 (BLADE) ×4 IMPLANT
ELECT REM PT RETURN 9FT ADLT (ELECTROSURGICAL) ×8
ELECTRODE REM PT RTRN 9FT ADLT (ELECTROSURGICAL) ×4 IMPLANT
FELT TEFLON 1X6 (MISCELLANEOUS) ×8 IMPLANT
GAUZE SPONGE 4X4 12PLY STRL (GAUZE/BANDAGES/DRESSINGS) ×8 IMPLANT
GLOVE BIO SURGEON STRL SZ 6 (GLOVE) ×16 IMPLANT
GLOVE BIO SURGEON STRL SZ 6.5 (GLOVE) ×12 IMPLANT
GLOVE BIO SURGEON STRL SZ7 (GLOVE) IMPLANT
GLOVE BIO SURGEON STRL SZ7.5 (GLOVE) ×8 IMPLANT
GLOVE BIO SURGEONS STRL SZ 6.5 (GLOVE) ×4
GLOVE BIOGEL PI IND STRL 6 (GLOVE) IMPLANT
GLOVE BIOGEL PI IND STRL 6.5 (GLOVE) IMPLANT
GLOVE BIOGEL PI IND STRL 7.0 (GLOVE) IMPLANT
GLOVE BIOGEL PI INDICATOR 6 (GLOVE)
GLOVE BIOGEL PI INDICATOR 6.5 (GLOVE)
GLOVE BIOGEL PI INDICATOR 7.0 (GLOVE)
GLOVE EUDERMIC 7 POWDERFREE (GLOVE) ×8 IMPLANT
GLOVE ORTHO TXT STRL SZ7.5 (GLOVE) IMPLANT
GOWN STRL REUS W/ TWL LRG LVL3 (GOWN DISPOSABLE) ×18 IMPLANT
GOWN STRL REUS W/ TWL XL LVL3 (GOWN DISPOSABLE) ×2 IMPLANT
GOWN STRL REUS W/TWL LRG LVL3 (GOWN DISPOSABLE) ×18
GOWN STRL REUS W/TWL XL LVL3 (GOWN DISPOSABLE) ×2
HEMOSTAT POWDER SURGIFOAM 1G (HEMOSTASIS) ×12 IMPLANT
HEMOSTAT SURGICEL 2X14 (HEMOSTASIS) ×4 IMPLANT
INSERT FOGARTY 61MM (MISCELLANEOUS) IMPLANT
INSERT FOGARTY XLG (MISCELLANEOUS) IMPLANT
KIT BASIN OR (CUSTOM PROCEDURE TRAY) ×4 IMPLANT
KIT CATH CPB BARTLE (MISCELLANEOUS) ×4 IMPLANT
KIT ROOM TURNOVER OR (KITS) ×4 IMPLANT
KIT SUCTION CATH 14FR (SUCTIONS) ×4 IMPLANT
KIT VASOVIEW HEMOPRO VH 3000 (KITS) ×4 IMPLANT
NS IRRIG 1000ML POUR BTL (IV SOLUTION) ×24 IMPLANT
PACK E OPEN HEART (SUTURE) ×4 IMPLANT
PACK OPEN HEART (CUSTOM PROCEDURE TRAY) ×4 IMPLANT
PAD ARMBOARD 7.5X6 YLW CONV (MISCELLANEOUS) ×8 IMPLANT
PAD ELECT DEFIB RADIOL ZOLL (MISCELLANEOUS) ×4 IMPLANT
PENCIL BUTTON HOLSTER BLD 10FT (ELECTRODE) ×4 IMPLANT
PUNCH AORTIC ROTATE 4.0MM (MISCELLANEOUS) IMPLANT
PUNCH AORTIC ROTATE 4.5MM 8IN (MISCELLANEOUS) ×4 IMPLANT
PUNCH AORTIC ROTATE 5MM 8IN (MISCELLANEOUS) IMPLANT
SET CARDIOPLEGIA MPS 5001102 (MISCELLANEOUS) ×4 IMPLANT
SPONGE INTESTINAL PEANUT (DISPOSABLE) IMPLANT
SPONGE LAP 18X18 X RAY DECT (DISPOSABLE) ×4 IMPLANT
SPONGE LAP 4X18 X RAY DECT (DISPOSABLE) ×4 IMPLANT
SUT BONE WAX W31G (SUTURE) ×4 IMPLANT
SUT ETHIBOND 2 0 SH (SUTURE) ×8
SUT ETHIBOND 2 0 SH 36X2 (SUTURE) ×8 IMPLANT
SUT MNCRL AB 4-0 PS2 18 (SUTURE) IMPLANT
SUT PROLENE 3 0 SH DA (SUTURE) IMPLANT
SUT PROLENE 3 0 SH1 36 (SUTURE) ×4 IMPLANT
SUT PROLENE 4 0 RB 1 (SUTURE) ×2
SUT PROLENE 4 0 SH DA (SUTURE) IMPLANT
SUT PROLENE 4-0 RB1 .5 CRCL 36 (SUTURE) ×2 IMPLANT
SUT PROLENE 5 0 C 1 36 (SUTURE) ×8 IMPLANT
SUT PROLENE 6 0 C 1 30 (SUTURE) ×8 IMPLANT
SUT PROLENE 7 0 BV 1 (SUTURE) IMPLANT
SUT PROLENE 7 0 BV1 MDA (SUTURE) ×4 IMPLANT
SUT PROLENE 8 0 BV175 6 (SUTURE) ×12 IMPLANT
SUT SILK  1 MH (SUTURE) ×6
SUT SILK 1 MH (SUTURE) ×6 IMPLANT
SUT SILK 1 TIES 10X30 (SUTURE) ×4 IMPLANT
SUT SILK 2 0 SH CR/8 (SUTURE) ×8 IMPLANT
SUT SILK 2 0 TIES 10X30 (SUTURE) ×4 IMPLANT
SUT SILK 2 0 TIES 17X18 (SUTURE) ×2
SUT SILK 2-0 18XBRD TIE BLK (SUTURE) ×2 IMPLANT
SUT SILK 3 0 SH CR/8 (SUTURE) ×4 IMPLANT
SUT SILK 4 0 TIE 10X30 (SUTURE) ×8 IMPLANT
SUT STEEL STERNAL CCS#1 18IN (SUTURE) IMPLANT
SUT STEEL SZ 6 DBL 3X14 BALL (SUTURE) ×12 IMPLANT
SUT TEM PAC WIRE 2 0 SH (SUTURE) ×16 IMPLANT
SUT VIC AB 1 CTX 36 (SUTURE) ×8
SUT VIC AB 1 CTX36XBRD ANBCTR (SUTURE) ×4 IMPLANT
SUT VIC AB 2-0 CT1 27 (SUTURE) ×2
SUT VIC AB 2-0 CT1 TAPERPNT 27 (SUTURE) ×2 IMPLANT
SUT VIC AB 2-0 CTX 27 (SUTURE) ×8 IMPLANT
SUT VIC AB 3-0 SH 27 (SUTURE)
SUT VIC AB 3-0 SH 27X BRD (SUTURE) IMPLANT
SUT VIC AB 3-0 X1 27 (SUTURE) ×8 IMPLANT
SUT VICRYL 4-0 PS2 18IN ABS (SUTURE) IMPLANT
SYSTEM SAHARA CHEST DRAIN ATS (WOUND CARE) ×4 IMPLANT
TAPE CLOTH SURG 4X10 WHT LF (GAUZE/BANDAGES/DRESSINGS) ×4 IMPLANT
TAPE PAPER 3X10 WHT MICROPORE (GAUZE/BANDAGES/DRESSINGS) ×4 IMPLANT
TOWEL GREEN STERILE (TOWEL DISPOSABLE) ×4 IMPLANT
TOWEL GREEN STERILE FF (TOWEL DISPOSABLE) ×4 IMPLANT
TOWEL OR 17X24 6PK STRL BLUE (TOWEL DISPOSABLE) ×4 IMPLANT
TOWEL OR 17X26 10 PK STRL BLUE (TOWEL DISPOSABLE) ×4 IMPLANT
TRAY FOLEY SILVER 16FR TEMP (SET/KITS/TRAYS/PACK) ×4 IMPLANT
TUBING INSUFFLATION (TUBING) ×4 IMPLANT
UNDERPAD 30X30 (UNDERPADS AND DIAPERS) ×4 IMPLANT
WATER STERILE IRR 1000ML POUR (IV SOLUTION) ×8 IMPLANT

## 2017-06-24 NOTE — Brief Op Note (Signed)
06/24/2017  11:19 AM  PATIENT:  [name]  71 y.o. male  PRE-OPERATIVE DIAGNOSIS:  Coronary Artery Disease  POST-OPERATIVE DIAGNOSIS:  Coronary Artery Disease  PROCEDURE:  Procedure(s): CORONARY ARTERY BYPASS GRAFTING (CABG) x 3 ; Using Left Internal Mammary Artery and Right Great Saphenous Leg Vein Harvested Endoscopically (N/A) TRANSESOPHAGEAL ECHOCARDIOGRAM (TEE) (N/A) LIMA-LAD SEQ SVG-[id number]  SURGEON:  Surgeon(s) and Role:    * Bartle, Fernande Boyden, MD - Primary  PHYSICIAN ASSISTANT: Trindon Dorton PA-C  ANESTHESIA:   general  EBL:     BLOOD ADMINISTERED:none  DRAINS: PLEURAL AND MEDIASTINAL CHEST TUBES   LOCAL MEDICATIONS USED:  NONE  SPECIMEN:  No Specimen  DISPOSITION OF SPECIMEN:  N/A  COUNTS:  YES  TOURNIQUET:  * No tourniquets in log *  DICTATION: .Dragon Dictation  PLAN OF CARE: Admit to inpatient   PATIENT DISPOSITION:  ICU - intubated and hemodynamically stable.   Delay start of Pharmacological VTE agent (>24hrs) due to surgical blood loss or risk of bleeding: yes  COMPLICATIONS: NO KNOWN

## 2017-06-24 NOTE — Anesthesia Procedure Notes (Signed)
Central Venous Catheter Insertion Performed by: Myrtie Soman, MD, anesthesiologist Start/End11/30/2018 6:53 AM, 06/24/2017 7:10 AM Patient location: Pre-op. Preanesthetic checklist: patient identified, IV checked, site marked, risks and benefits discussed, surgical consent, monitors and equipment checked, pre-op evaluation, timeout performed and anesthesia consent Position: Trendelenburg Hand hygiene performed  and maximum sterile barriers used  Catheter size: 8.5 Fr PA cath was placed.Sheath introducer Swan type:thermodilution PA Cath depth:45 Procedure performed without using ultrasound guided technique. Ultrasound Notes:anatomy identified, needle tip was noted to be adjacent to the nerve/plexus identified, no ultrasound evidence of intravascular and/or intraneural injection and image(s) printed for medical record Attempts: 1 Following insertion, line sutured, Biopatch and dressing applied. Post procedure assessment: blood return through all ports, free fluid flow and no air  Patient tolerated the procedure well with no immediate complications.

## 2017-06-24 NOTE — OR Nursing (Signed)
11:40 - 45 minute call to SICU charge nurse

## 2017-06-24 NOTE — Progress Notes (Signed)
  Echocardiogram Echocardiogram Transesophageal has been performed.  Antonio Wells R 06/24/2017, 9:22 AM

## 2017-06-24 NOTE — Progress Notes (Signed)
Patient ID: SAMUL MCINROY, male   DOB: 04-Oct-1945, 71 y.o.   MRN: [id number] EVENING ROUNDS NOTE :     [address]       [place] [address]             [phone]                 Day of Surgery Procedure(s) (LRB): CORONARY ARTERY BYPASS GRAFTING (CABG) x 3 ; Using Left Internal Mammary Artery and Right Great Saphenous Leg Vein Harvested Endoscopically (N/A) TRANSESOPHAGEAL ECHOCARDIOGRAM (TEE) (N/A)  Total Length of Stay:  LOS: 0 days  BP 96/69 (BP Location: Left Arm)   Pulse 89   Temp 98.2 F (36.8 C)   Resp 14   SpO2 100%   .Intake/Output      11/29 0701 - 11/30 0700 11/30 0701 - 12/01 0700   I.V.  2313.7   Blood  300   NG/GT  30   IV Piggyback  1580   Total Intake  4223.7   Urine  605   Emesis/NG output  0   Blood  150   Chest Tube  78   Total Output  833   Net  +3390.7          . sodium chloride 20 mL/hr at 06/24/17 1255  . [START ON 06/25/2017] sodium chloride    . sodium chloride 20 mL/hr at 06/24/17 1255  . albumin human    . dexmedetomidine (PRECEDEX) IV infusion Stopped (06/24/17 1430)  . DOPamine 2 mcg/kg/min (06/24/17 1600)  . famotidine (PEPCID) IV Stopped (06/24/17 1325)  . insulin (NOVOLIN-R) infusion 0.8 Units/hr (06/24/17 1600)  . lactated ringers    . lactated ringers 20 mL/hr at 06/24/17 1300  . lactated ringers 20 mL/hr at 06/24/17 1259  . [START ON 06/25/2017] levofloxacin (LEVAQUIN) IV    . magnesium sulfate 4 g (06/24/17 1302)  . nitroGLYCERIN    . phenylephrine (NEO-SYNEPHRINE) Adult infusion 30 mcg/min (06/24/17 1600)  . vancomycin       Lab Results  Component Value Date   WBC 6.6 06/24/2017   HGB 9.4 (L) 06/24/2017   HCT 27.0 (L) 06/24/2017   PLT 120 (L) 06/24/2017   GLUCOSE 209 (H) 06/24/2017   CHOL 159 05/12/2017   TRIG 166 (H) 05/12/2017   HDL 45 05/12/2017   LDLCALC 81 05/12/2017   ALT 20 06/20/2017   AST 21 06/20/2017   NA 142 06/24/2017   K 3.5 06/24/2017   CL 105 06/24/2017   CREATININE 1.20  06/24/2017   BUN 18 06/24/2017   CO2 24 06/20/2017   TSH 3.244 11/22/2014   PSA 0.88 06/03/2015   INR 1.35 06/24/2017   HGBA1C 8.2 (H) 06/20/2017   MICROALBUR 39.6 08/25/2015   Stable postop , not bleeding  Starting to wake up   [name] MD  Beeper [phone] Office [phone] 06/24/2017 4:25 PM

## 2017-06-24 NOTE — Anesthesia Procedure Notes (Signed)
Procedure Name: Intubation Date/Time: 06/24/2017 7:50 AM Performed by: Izora Gala, CRNA Pre-anesthesia Checklist: Patient identified, Emergency Drugs available, Suction available and Patient being monitored Patient Re-evaluated:Patient Re-evaluated prior to induction Oxygen Delivery Method: Circle system utilized Preoxygenation: Pre-oxygenation with 100% oxygen Induction Type: IV induction Ventilation: Mask ventilation without difficulty and Oral airway inserted - appropriate to patient size Laryngoscope Size: Miller and 3 Grade View: Grade II Tube type: Oral Tube size: 7.5 mm Number of attempts: 1 Airway Equipment and Method: Stylet Placement Confirmation: ETT inserted through vocal cords under direct vision,  positive ETCO2 and breath sounds checked- equal and bilateral Secured at: 22 cm Tube secured with: Tape Dental Injury: Teeth and Oropharynx as per pre-operative assessment

## 2017-06-24 NOTE — Interval H&P Note (Signed)
History and Physical Interval Note:  06/24/2017 7:14 AM  Antonio Wells  has presented today for surgery, with the diagnosis of Coronary Artery Disease  The various methods of treatment have been discussed with the patient and family. After consideration of risks, benefits and other options for treatment, the patient has consented to  Procedure(s): CORONARY ARTERY BYPASS GRAFTING (CABG), ENOSCOPIC VESSEL HARVESTING, TEE (N/A) TRANSESOPHAGEAL ECHOCARDIOGRAM (TEE) (N/A) as a surgical intervention .  The patient's history has been reviewed, patient examined, no change in status, stable for surgery.  I have reviewed the patient's chart and labs.  Questions were answered to the patient's satisfaction.     Gaye Pollack

## 2017-06-24 NOTE — Anesthesia Procedure Notes (Signed)
Arterial Line Insertion Start/End11/30/2018 7:00 AM Performed by: CRNA  Preanesthetic checklist: patient identified and pre-op evaluation Lidocaine 1% used for infiltration Right, radial was placed Catheter size: 20 G Hand hygiene performed  and maximum sterile barriers used   Attempts: 1 Procedure performed without using ultrasound guided technique. Following insertion, dressing applied and Biopatch. Post procedure assessment: unchanged  Patient tolerated the procedure well with no immediate complications.

## 2017-06-24 NOTE — Anesthesia Postprocedure Evaluation (Signed)
Anesthesia Post Note  Patient: Antonio Wells  Procedure(s) Performed: CORONARY ARTERY BYPASS GRAFTING (CABG) x 3 ; Using Left Internal Mammary Artery and Right Great Saphenous Leg Vein Harvested Endoscopically (N/A Chest) TRANSESOPHAGEAL ECHOCARDIOGRAM (TEE) (N/A )     Patient location during evaluation: SICU Anesthesia Type: General Level of consciousness: sedated Pain management: pain level controlled Vital Signs Assessment: post-procedure vital signs reviewed and stable Respiratory status: patient remains intubated per anesthesia plan Cardiovascular status: stable Postop Assessment: no apparent nausea or vomiting Anesthetic complications: no    Last Vitals:  Vitals:   06/24/17 1415 06/24/17 1430  BP:    Pulse: 89 89  Resp: 12 12  Temp: 36.5 C 36.6 C  SpO2: 100% 100%    Last Pain:  Vitals:   06/24/17 1400  TempSrc: Core (Comment)                 Rhianne Soman S

## 2017-06-24 NOTE — Op Note (Signed)
CARDIOVASCULAR SURGERY OPERATIVE NOTE  06/24/2017  Surgeon:  Gaye Pollack, MD  First Assistant: Jadene Pierini,  PA-C   Preoperative Diagnosis:  Severe two-vessel coronary artery disease   Postoperative Diagnosis:  Same   Procedure:  1. Median Sternotomy 2. Extracorporeal circulation 3.   Coronary artery bypass grafting x 3   Left internal mammary graft to the LAD  Sequential SVG to OM1 and OM3   4.   Endoscopic vein harvest from the right leg   Anesthesia:  General Endotracheal   Clinical History/Surgical Indication:  The patient is a 71 year old gentleman with a history of insulin-dependent diabetes for the past 5 years, hypertension, hyperlipidemia, obstructive sleep apnea, prior smoking until 15 years ago, who was evaluated by Dr. Meda Coffee for new onset exertional shortness of breath. He says that he had been previously very active and was riding an exercise bike for up to an hour 3 times per week but now seems to get tired very quickly with shortness of breath. He has had lower extremity swelling. He denies any chest pain or pressure. He has had no pain in his neck, shoulders, or arms. He denies any dizziness or syncope. He had a gated cardiac CT on 05/24/2017 which showed a coronary calcium score of 1348 as well as severe obstructive 2 vessel disease in the ostium and proximal LAD greater than 70%. The left circumflex coronary artery had ostial and proximal disease with 50-69% stenosis. An FFR analysis showed severe obstructive two-vessel coronary disease in the ostial LAD and left circumflex as well as the OM1 branch. Cardiac catheterization on 05/25/2017 showed a 90% ostial LAD stenosis. There was 80% stenosis in the proximal to mid left circumflex. There was 90% stenosis in the first marginal branch. There is mild aortic stenosis with a mean gradient of 14 mmHg. He had an  echocardiogram done on 05/25/2017 which showed normal left ventricular ejection fraction of 60-65%. There is grade 1 diastolic dysfunction. The aortic valve had mildly calcified leaflets with a mean gradient of 12 mmHg and a peak gradient of 22 mmHg and a dimensionless index of 0.42 consistent with mild aortic stenosis. The mitral valve had mildly thickened leaflets with a peak gradient of 4 mmHg and mild regurgitation.   He has severe two-vessel coronary disease with exertional shortness of breath and fatigue as well as mild aortic stenosis. I agree that given the ostial location of his LAD stenosis as well as the bifurcation disease in the left circumflex coronary artery and the presence of diabetes, coronary bypass graft surgery is the best treatment. His aortic stenosis is mild and does not require treatment at this time but will require continued follow-up. I reviewed the cardiac catheterization and echocardiogram studies with the patient and answered his questions. I discussed the operative procedure with the patient including alternatives, benefits and risks; including but not limited to bleeding, blood transfusion, infection, stroke, myocardial infarction, graft failure, heart block requiring a permanent pacemaker, organ dysfunction, and death. [name] understands and agrees to proceed.    Preparation:  The patient was seen in the preoperative holding area and the correct patient, correct operation were confirmed with the patient after reviewing the medical record and catheterization. The consent was signed by me. Preoperative antibiotics were given. A pulmonary arterial line and radial arterial line were placed by the anesthesia team. The patient was taken back to the operating room and positioned supine on the operating room table. After being placed under general endotracheal anesthesia by  the anesthesia team a foley catheter was placed. The neck, chest, abdomen, and both legs were  prepped with betadine soap and solution and draped in the usual sterile manner. A surgical time-out was taken and the correct patient and operative procedure were confirmed with the nursing and anesthesia staff.   Cardiopulmonary Bypass:  A median sternotomy was performed. The pericardium was opened in the midline. Right ventricular function appeared normal. The ascending aorta was of normal size and had no palpable plaque. There were no contraindications to aortic cannulation or cross-clamping. The patient was fully systemically heparinized and the ACT was maintained > 400 sec. The proximal aortic arch was cannulated with a 20 F aortic cannula for arterial inflow. Venous cannulation was performed via the right atrial appendage using a two-staged venous cannula. An antegrade cardioplegia/vent cannula was inserted into the mid-ascending aorta. Aortic occlusion was performed with a single cross-clamp. Systemic cooling to 32 degrees Centigrade and topical cooling of the heart with iced saline were used. Hyperkalemic antegrade cold blood cardioplegia was used to induce diastolic arrest and was then given at about 20 minute intervals throughout the period of arrest to maintain myocardial temperature at or below 10 degrees centigrade. A temperature probe was inserted into the interventricular septum and an insulating pad was placed in the pericardium.   Left internal mammary harvest:  The left side of the sternum was retracted using the Rultract retractor. The left internal mammary artery was harvested as a pedicle graft. All side branches were clipped. It was a medium-sized vessel of good quality with excellent blood flow. It was ligated distally and divided. It was sprayed with topical papaverine solution to prevent vasospasm.   Endoscopic vein harvest:  The right greater saphenous vein was harvested endoscopically through a 2 cm incision medial to the right knee. It was harvested from the upper thigh to  below the knee. It was a medium-sized vein of good quality in the lower leg but just above the knee it became small and I think there was probably a dual system and we could not locate the other vein. The side branches were all ligated with 4-0 silk ties.    Coronary arteries:  The coronary arteries were examined.   LAD:  Large vessel that was intramyocardial in its proximal and mid portions and came to the surface distally. There was segmental distal plaque   LCX:  OM1 large and heavily diseased proximally. It became intramyocardial half way down and I traced it just inside the muscle where it was free of disease. The OM2 was a medium sized vessel but did not line up well with the OM1 for a sequential graft and therefore I grafted OM3 which was also a medium sized vessel with no distal disease.  RCA:  No stenosis on cath   Grafts:  1. LIMA to the LAD: 2.0 mm. It was sewn end to side using 8-0 prolene continuous suture. 2. Sequential SVG to OM1:  1.75 mm. It was sewn sequential side to side using 7-0 prolene continuous suture. 3. Sequential SVG to OM3:  1.6 mm. It was sewn sequential end to side using 7-0 prolene continuous suture.   The proximal vein graft anastomoses were performed to the mid-ascending aorta using continuous 6-0 prolene suture. Graft markers were placed around the proximal anastomoses.   Completion:  The patient was rewarmed to 37 degrees Centigrade. The clamp was removed from the LIMA pedicle and there was rapid warming of the septum and return of ventricular  fibrillation. The crossclamp was removed with a time of 62 minutes. There was spontaneous return of sinus rhythm. The distal and proximal anastomoses were checked for hemostasis. The position of the grafts was satisfactory. Two temporary epicardial pacing wires were placed on the right atrium and two on the right ventricle. The patient was weaned from CPB without difficulty on no inotropes. CPB time was 88 minutes.  Cardiac output was 4.5 LPM. Heparin was fully reversed with protamine and the aortic and venous cannulas removed. Hemostasis was achieved. Mediastinal and left pleural drainage tubes were placed. The sternum was closed with double #6 stainless steel wires. The fascia was closed with continuous # 1 vicryl suture. The subcutaneous tissue was closed with 2-0 vicryl continuous suture. The skin was closed with 3-0 vicryl subcuticular suture. All sponge, needle, and instrument counts were reported correct at the end of the case. Dry sterile dressings were placed over the incisions and around the chest tubes which were connected to pleurevac suction. The patient was then transported to the surgical intensive care unit in critical but stable condition.

## 2017-06-24 NOTE — Anesthesia Preprocedure Evaluation (Signed)
Anesthesia Evaluation  Patient identified by MRN, date of birth, ID band Patient awake    Reviewed: Allergy & Precautions, NPO status , Patient's Chart, lab work & pertinent test results  Airway Mallampati: II  TM Distance: >3 FB Neck ROM: Full    Dental no notable dental hx.    Pulmonary neg pulmonary ROS, former smoker,    Pulmonary exam normal breath sounds clear to auscultation       Cardiovascular hypertension, + CAD  Normal cardiovascular exam+ Valvular Problems/Murmurs AS  Rhythm:Regular Rate:Normal  Left ventricle: The cavity size was normal. Wall thickness was   increased in a pattern of mild LVH. Systolic function was normal.   The estimated ejection fraction was in the range of 60% to 65%.   Wall motion was normal; there were no regional wall motion   abnormalities. Doppler parameters are consistent with abnormal   left ventricular relaxation (grade 1 diastolic dysfunction). - Aortic valve: Mildly calcified leaflets. Mild stenosis. Mean   gradient (S): 12 mm Hg. Peak gradient (S): 22 mm Hg. Valve area   (VTI): 1.59 cm^2. Valve area (Vmax): 1.59 cm^2. Valve area   (Vmean): 1.67 cm^2. - Mitral valve: Mildly thickened leaflets . There was mild   regurgitation. - Left atrium: The atrium was normal in size. - Atrial septum: No defect or patent foramen ovale was identified. - Tricuspid valve: There was trivial regurgitation. - Pulmonary arteries: PA peak pressure: 31 mm Hg (S). - Inferior vena cava: The vessel was normal in size. The   respirophasic diameter changes were in the normal range (>= 50%),   consistent with normal central venous pressure.  Impressions:  - Compared to a prior study in 2016, the LVEF is unchanged at   60-65%. There is mild aortic stenosis wtih a small increase in   the mean gradient from 8 to 12 mmHg, corresponding to an AVA of   around 1.6 cm2.   Neuro/Psych negative neurological ROS  negative psych ROS   GI/Hepatic negative GI ROS, Neg liver ROS,   Endo/Other  negative endocrine ROSdiabetes  Renal/GU negative Renal ROS  negative genitourinary   Musculoskeletal negative musculoskeletal ROS (+)   Abdominal   Peds negative pediatric ROS (+)  Hematology negative hematology ROS (+)   Anesthesia Other Findings   Reproductive/Obstetrics negative OB ROS                             Anesthesia Physical Anesthesia Plan  ASA: III  Anesthesia Plan: General   Post-op Pain Management:    Induction: Intravenous  PONV Risk Score and Plan: Treatment may vary due to age or medical condition  Airway Management Planned: Oral ETT  Additional Equipment: Arterial line, CVP, PA Cath and TEE  Intra-op Plan:   Post-operative Plan: Post-operative intubation/ventilation  Informed Consent: I have reviewed the patients History and Physical, chart, labs and discussed the procedure including the risks, benefits and alternatives for the proposed anesthesia with the patient or authorized representative who has indicated his/her understanding and acceptance.   Dental advisory given  Plan Discussed with: CRNA and Surgeon  Anesthesia Plan Comments:         Anesthesia Quick Evaluation

## 2017-06-24 NOTE — Procedures (Signed)
Extubation Procedure Note  Patient Details:   Name: Antonio Wells DOB: 1945-09-01 MRN: [id number]   Airway Documentation:     Evaluation  O2 sats: stable throughout Complications: No apparent complications Patient did tolerate procedure well. Bilateral Breath Sounds: Clear   Yes   Pt. Was extubated to a 4L [place] without any complications, dyspnea or stridor noted. Pt. Achieved .6L on VC & -20 on NIF. Pt. Was instructed on IS x 5, highest goal reached was 548mL.  Ashtan Laton, Eddie North 06/24/2017, 6:20 PM

## 2017-06-24 NOTE — OR Nursing (Signed)
12:15 - Call to Cath Lab to clear hallway

## 2017-06-24 NOTE — OR Nursing (Signed)
12:15 - 20 minute call to SICU Charge Nurse.

## 2017-06-24 NOTE — Anesthesia Procedure Notes (Signed)
Anesthesia Procedure Image    

## 2017-06-24 NOTE — Transfer of Care (Signed)
Immediate Anesthesia Transfer of Care Note  Patient: Antonio Wells  Procedure(s) Performed: CORONARY ARTERY BYPASS GRAFTING (CABG) x 3 ; Using Left Internal Mammary Artery and Right Great Saphenous Leg Vein Harvested Endoscopically (N/A Chest) TRANSESOPHAGEAL ECHOCARDIOGRAM (TEE) (N/A )  Patient Location: SICU  Anesthesia Type:General  Level of Consciousness: Patient remains intubated per anesthesia plan  Airway & Oxygen Therapy: Patient remains intubated per anesthesia plan and Patient placed on Ventilator (see vital sign flow sheet for setting)  Post-op Assessment: Report given to RN  Post vital signs: Reviewed and stable  Last Vitals:  Vitals:   06/24/17 0610 06/24/17 1248  BP: (!) 164/71 (!) 110/57  Pulse: 67 90  Resp: 20 14  Temp: 36.6 C   SpO2: 99% 100%    Last Pain:  Vitals:   06/24/17 0610  TempSrc: Oral         Complications: No apparent anesthesia complications

## 2017-06-24 NOTE — OR Nursing (Signed)
12:42 - On the way call made to SICU Charge Nurse

## 2017-06-25 ENCOUNTER — Inpatient Hospital Stay (HOSPITAL_COMMUNITY): Payer: Medicare Other

## 2017-06-25 DIAGNOSIS — I9789 Other postprocedural complications and disorders of the circulatory system, not elsewhere classified: Secondary | ICD-10-CM

## 2017-06-25 DIAGNOSIS — I4891 Unspecified atrial fibrillation: Secondary | ICD-10-CM

## 2017-06-25 HISTORY — DX: Other postprocedural complications and disorders of the circulatory system, not elsewhere classified: I97.89

## 2017-06-25 HISTORY — DX: Unspecified atrial fibrillation: I48.91

## 2017-06-25 LAB — CBC
HEMATOCRIT: 31.4 % — AB (ref 39.0–52.0)
HEMATOCRIT: 32.7 % — AB (ref 39.0–52.0)
HEMOGLOBIN: 10.6 g/dL — AB (ref 13.0–17.0)
Hemoglobin: 11 g/dL — ABNORMAL LOW (ref 13.0–17.0)
MCH: 29.9 pg (ref 26.0–34.0)
MCH: 30.8 pg (ref 26.0–34.0)
MCHC: 33.8 g/dL (ref 30.0–36.0)
MCHC: 33.6 g/dL (ref 30.0–36.0)
MCV: 88.7 fL (ref 78.0–100.0)
MCV: 91.6 fL (ref 78.0–100.0)
Platelets: 152 10*3/uL (ref 150–400)
Platelets: 149 10*3/uL — ABNORMAL LOW (ref 150–400)
RBC: 3.54 MIL/uL — ABNORMAL LOW (ref 4.22–5.81)
RBC: 3.57 MIL/uL — ABNORMAL LOW (ref 4.22–5.81)
RDW: 13.4 % (ref 11.5–15.5)
RDW: 13.9 % (ref 11.5–15.5)
WBC: 11.1 10*3/uL — AB (ref 4.0–10.5)
WBC: 10.9 10*3/uL — AB (ref 4.0–10.5)

## 2017-06-25 LAB — GLUCOSE, CAPILLARY
GLUCOSE-CAPILLARY: 114 mg/dL — AB (ref 65–99)
GLUCOSE-CAPILLARY: 120 mg/dL — AB (ref 65–99)
GLUCOSE-CAPILLARY: 123 mg/dL — AB (ref 65–99)
GLUCOSE-CAPILLARY: 132 mg/dL — AB (ref 65–99)
GLUCOSE-CAPILLARY: 132 mg/dL — AB (ref 65–99)
GLUCOSE-CAPILLARY: 137 mg/dL — AB (ref 65–99)
GLUCOSE-CAPILLARY: 137 mg/dL — AB (ref 65–99)
GLUCOSE-CAPILLARY: 139 mg/dL — AB (ref 65–99)
GLUCOSE-CAPILLARY: 154 mg/dL — AB (ref 65–99)
GLUCOSE-CAPILLARY: 162 mg/dL — AB (ref 65–99)
GLUCOSE-CAPILLARY: 87 mg/dL (ref 65–99)
Glucose-Capillary: 100 mg/dL — ABNORMAL HIGH (ref 65–99)
Glucose-Capillary: 116 mg/dL — ABNORMAL HIGH (ref 65–99)
Glucose-Capillary: 126 mg/dL — ABNORMAL HIGH (ref 65–99)
Glucose-Capillary: 155 mg/dL — ABNORMAL HIGH (ref 65–99)
Glucose-Capillary: 165 mg/dL — ABNORMAL HIGH (ref 65–99)

## 2017-06-25 LAB — POCT I-STAT, CHEM 8
BUN: 16 mg/dL (ref 6–20)
CREATININE: 1.6 mg/dL — AB (ref 0.61–1.24)
Calcium, Ion: 1.09 mmol/L — ABNORMAL LOW (ref 1.15–1.40)
Chloride: 103 mmol/L (ref 101–111)
GLUCOSE: 171 mg/dL — AB (ref 65–99)
HCT: 34 % — ABNORMAL LOW (ref 39.0–52.0)
Hemoglobin: 11.6 g/dL — ABNORMAL LOW (ref 13.0–17.0)
POTASSIUM: 4.3 mmol/L (ref 3.5–5.1)
Sodium: 142 mmol/L (ref 135–145)
TCO2: 24 mmol/L (ref 22–32)

## 2017-06-25 LAB — BASIC METABOLIC PANEL
ANION GAP: 8 (ref 5–15)
BUN: 14 mg/dL (ref 6–20)
CALCIUM: 8 mg/dL — AB (ref 8.9–10.3)
CHLORIDE: 109 mmol/L (ref 101–111)
CO2: 24 mmol/L (ref 22–32)
CREATININE: 1.48 mg/dL — AB (ref 0.61–1.24)
GFR calc non Af Amer: 46 mL/min — ABNORMAL LOW (ref >60)
GFR, EST AFRICAN AMERICAN: 54 mL/min — AB (ref >60)
Glucose, Bld: 136 mg/dL — ABNORMAL HIGH (ref 65–99)
Potassium: 4.3 mmol/L (ref 3.5–5.1)
SODIUM: 141 mmol/L (ref 135–145)

## 2017-06-25 LAB — MAGNESIUM
MAGNESIUM: 2.4 mg/dL (ref 1.7–2.4)
MAGNESIUM: 2.2 mg/dL (ref 1.7–2.4)

## 2017-06-25 LAB — CREATININE, SERUM
CREATININE: 1.7 mg/dL — AB (ref 0.61–1.24)
GFR, EST AFRICAN AMERICAN: 45 mL/min — AB (ref >60)
GFR, EST NON AFRICAN AMERICAN: 39 mL/min — AB (ref >60)

## 2017-06-25 LAB — TSH: TSH: 0.614 u[IU]/mL (ref 0.350–4.500)

## 2017-06-25 MED ORDER — ORAL CARE MOUTH RINSE
15.0000 mL | Freq: Two times a day (BID) | OROMUCOSAL | Status: DC
  Administered 2017-06-25 – 2017-06-27 (×4): 15 mL via OROMUCOSAL

## 2017-06-25 MED ORDER — ENOXAPARIN SODIUM 30 MG/0.3ML ~~LOC~~ SOLN
30.0000 mg | Freq: Every day | SUBCUTANEOUS | Status: DC
  Administered 2017-06-25 – 2017-06-27 (×3): 30 mg via SUBCUTANEOUS
  Filled 2017-06-25 (×4): qty 0.3

## 2017-06-25 MED ORDER — AMIODARONE IV BOLUS ONLY 150 MG/100ML
150.0000 mg | Freq: Once | INTRAVENOUS | Status: AC
  Administered 2017-06-25: 150 mg via INTRAVENOUS
  Filled 2017-06-25: qty 100

## 2017-06-25 MED ORDER — INSULIN DETEMIR 100 UNIT/ML ~~LOC~~ SOLN
15.0000 [IU] | Freq: Once | SUBCUTANEOUS | Status: AC
  Administered 2017-06-25: 15 [IU] via SUBCUTANEOUS
  Filled 2017-06-25: qty 0.15

## 2017-06-25 MED ORDER — AMIODARONE HCL 200 MG PO TABS
400.0000 mg | ORAL_TABLET | Freq: Two times a day (BID) | ORAL | Status: DC
  Administered 2017-06-25: 400 mg via ORAL
  Filled 2017-06-25: qty 2

## 2017-06-25 MED ORDER — INSULIN ASPART 100 UNIT/ML ~~LOC~~ SOLN
0.0000 [IU] | SUBCUTANEOUS | Status: DC
  Administered 2017-06-25: 4 [IU] via SUBCUTANEOUS
  Administered 2017-06-25: 2 [IU] via SUBCUTANEOUS
  Administered 2017-06-25 – 2017-06-26 (×2): 4 [IU] via SUBCUTANEOUS
  Administered 2017-06-26 (×2): 2 [IU] via SUBCUTANEOUS
  Administered 2017-06-26: 8 [IU] via SUBCUTANEOUS
  Administered 2017-06-26 – 2017-06-27 (×2): 4 [IU] via SUBCUTANEOUS
  Administered 2017-06-27: 2 [IU] via SUBCUTANEOUS

## 2017-06-25 MED ORDER — AMIODARONE HCL 200 MG PO TABS
400.0000 mg | ORAL_TABLET | Freq: Two times a day (BID) | ORAL | Status: DC
  Administered 2017-06-25 – 2017-06-29 (×8): 400 mg via ORAL
  Filled 2017-06-25 (×9): qty 2

## 2017-06-25 MED ORDER — INSULIN DETEMIR 100 UNIT/ML ~~LOC~~ SOLN
15.0000 [IU] | Freq: Every day | SUBCUTANEOUS | Status: DC
  Administered 2017-06-26 – 2017-06-27 (×2): 15 [IU] via SUBCUTANEOUS
  Filled 2017-06-25 (×3): qty 0.15

## 2017-06-25 MED ORDER — AMIODARONE HCL 200 MG PO TABS: 400.0000 mg | ORAL_TABLET | Freq: Every day | ORAL | Status: DC

## 2017-06-25 MED ORDER — FUROSEMIDE 10 MG/ML IJ SOLN
40.0000 mg | Freq: Once | INTRAMUSCULAR | Status: AC
  Administered 2017-06-25: 40 mg via INTRAVENOUS
  Filled 2017-06-25: qty 4

## 2017-06-25 NOTE — Progress Notes (Signed)
Patient ID: Antonio Wells, male   DOB: [birth year], 71 y.o.   MRN: [id number] EVENING ROUNDS NOTE :     [address]       [place] [address]             [phone]                 1 Day Post-Op Procedure(s) (LRB): CORONARY ARTERY BYPASS GRAFTING (CABG) x 3 ; Using Left Internal Mammary Artery and Right Great Saphenous Leg Vein Harvested Endoscopically (N/A) TRANSESOPHAGEAL ECHOCARDIOGRAM (TEE) (N/A)  Total Length of Stay:  LOS: 1 day  BP 120/65   Pulse 90   Temp 97.7 F (36.5 C) (Oral)   Resp 18   Wt 204 lb 12.9 oz (92.9 kg)   SpO2 100%   BMI 36.28 kg/m   .Intake/Output      11/30 0701 - 12/01 0700 12/01 0701 - 12/02 0700   P.O. 240    I.V. (mL/kg) 3405.1 (36.7) 601.6 (6.5)   Blood 300    NG/GT 30    IV Piggyback 1870    Total Intake(mL/kg) 5845.1 (62.9) 601.6 (6.5)   Urine (mL/kg/hr) 1595 (0.7) 650 (0.6)   Emesis/NG output 0    Blood 150    Chest Tube 378 100   Total Output 2123 750   Net +3722.1 -148.4          . sodium chloride Stopped (06/25/17 0900)  . sodium chloride Stopped (06/25/17 0900)  . sodium chloride Stopped (06/25/17 0900)  . dexmedetomidine (PRECEDEX) IV infusion Stopped (06/24/17 1430)  . DOPamine 2 mcg/kg/min (06/25/17 1700)  . insulin (NOVOLIN-R) infusion Stopped (06/25/17 1420)  . lactated ringers    . lactated ringers Stopped (06/25/17 0900)  . lactated ringers 20 mL/hr at 06/25/17 1700  . nitroGLYCERIN    . phenylephrine (NEO-SYNEPHRINE) Adult infusion Stopped (06/24/17 2200)     Lab Results  Component Value Date   WBC 10.9 (H) 06/25/2017   HGB 11.6 (L) 06/25/2017   HCT 34.0 (L) 06/25/2017   PLT 149 (L) 06/25/2017   GLUCOSE 171 (H) 06/25/2017   CHOL 159 05/12/2017   TRIG 166 (H) 05/12/2017   HDL 45 05/12/2017   LDLCALC 81 05/12/2017   ALT 20 06/20/2017   AST 21 06/20/2017   NA 142 06/25/2017   K 4.3 06/25/2017   CL 103 06/25/2017   CREATININE 1.60 (H) 06/25/2017   BUN 16 06/25/2017   CO2 24 06/25/2017    TSH 0.614 06/25/2017   PSA 0.88 06/03/2015   INR 1.35 06/24/2017   HGBA1C 8.2 (H) 06/20/2017   MICROALBUR 39.6 08/25/2015   uop adquate today with lasix, cr stable 1.6 Holding sinus  [name] MD  Beeper [phone] Office [phone] 06/25/2017 6:22 PM

## 2017-06-25 NOTE — Progress Notes (Signed)
Patient ID: WRIGHT GRAVELY, male   DOB: Jun 09, 1946, 71 y.o.   MRN: [id number] TCTS DAILY ICU PROGRESS NOTE                   [address]            [place] [address]          [phone]   1 Day Post-Op Procedure(s) (LRB): CORONARY ARTERY BYPASS GRAFTING (CABG) x 3 ; Using Left Internal Mammary Artery and Right Great Saphenous Leg Vein Harvested Endoscopically (N/A) TRANSESOPHAGEAL ECHOCARDIOGRAM (TEE) (N/A)  Total Length of Stay:  LOS: 1 day   Subjective: Patient awake alert neurologically intact extubated, had brief episode of SVT this morning broke with 2.5 mg of IV Lopressor   Objective: Vital signs in last 24 hours: Temp:  [96.6 F (35.9 C)-99.7 F (37.6 C)] 98.8 F (37.1 C) (12/01 0700) Pulse Rate:  [88-111] 97 (12/01 0700) Cardiac Rhythm: Normal sinus rhythm;Atrial paced (12/01 0100) Resp:  [12-27] 20 (12/01 0700) BP: (93-132)/(56-88) 100/63 (12/01 0700) SpO2:  [96 %-100 %] 96 % (12/01 0700) Arterial Line BP: (93-137)/(50-81) 130/58 (12/01 0700) FiO2 (%):  [40 %-50 %] 40 % (11/30 1800) Weight:  [195 lb 1.7 oz (88.5 kg)-204 lb 12.9 oz (92.9 kg)] 204 lb 12.9 oz (92.9 kg) (12/01 0600)  Filed Weights   06/24/17 1900 06/25/17 0600  Weight: 195 lb 1.7 oz (88.5 kg) 204 lb 12.9 oz (92.9 kg)    Weight change:    Hemodynamic parameters for last 24 hours: PAP: (25-52)/(0-28) 35/0 CO:  [3.9 L/min-7.3 L/min] 7.3 L/min CI:  [2 L/min/m2-3.8 L/min/m2] 3.8 L/min/m2  Intake/Output from previous day: 11/30 0701 - 12/01 0700 In: 5845.1 [P.O.:240; I.V.:3405.1; Blood:300; NG/GT:30; IV Piggyback:1870] Out: 2123 [XNATF:5732; Blood:150; Chest Tube:378]  Intake/Output this shift: No intake/output data recorded.  Current Meds: Scheduled Meds: . acetaminophen  1,000 mg Oral Q6H   Or  . acetaminophen (TYLENOL) oral liquid 160 mg/5 mL  1,000 mg Per Tube Q6H  . aspirin EC  325 mg Oral Daily   Or  . aspirin  324 mg Per Tube Daily  . atorvastatin  80 mg Oral  q1800  . bisacodyl  10 mg Oral Daily   Or  . bisacodyl  10 mg Rectal Daily  . chlorhexidine gluconate (MEDLINE KIT)  15 mL Mouth Rinse BID  . docusate sodium  200 mg Oral Daily  . insulin regular  0-10 Units Intravenous TID WC  . mouth rinse  15 mL Mouth Rinse 10 times per day  . metoprolol tartrate  12.5 mg Oral BID   Or  . metoprolol tartrate  12.5 mg Per Tube BID  . [START ON 06/26/2017] pantoprazole  40 mg Oral Daily  . sodium chloride flush  3 mL Intravenous Q12H   Continuous Infusions: . sodium chloride 20 mL/hr at 06/25/17 0700  . sodium chloride 250 mL (06/25/17 0600)  . sodium chloride 20 mL/hr at 06/25/17 0000  . albumin human    . dexmedetomidine (PRECEDEX) IV infusion Stopped (06/24/17 1430)  . DOPamine 2 mcg/kg/min (06/25/17 0700)  . insulin (NOVOLIN-R) infusion 1.4 Units/hr (06/25/17 0700)  . lactated ringers    . lactated ringers 20 mL/hr at 06/25/17 0700  . lactated ringers 20 mL/hr at 06/25/17 0700  . levofloxacin (LEVAQUIN) IV    . nitroGLYCERIN    . phenylephrine (NEO-SYNEPHRINE) Adult infusion Stopped (06/24/17 2200)   PRN Meds:.sodium chloride, albumin human, lactated ringers, metoprolol tartrate, midazolam, morphine injection,  ondansetron (ZOFRAN) IV, oxyCODONE, sodium chloride flush, traMADol  General appearance: alert, cooperative and no distress Neurologic: intact Heart: regular rate and rhythm, S1, S2 normal, no murmur, click, rub or gallop Lungs: diminished breath sounds bibasilar Abdomen: soft, non-tender; bowel sounds normal; no masses,  no organomegaly and Right antecubital IV infiltrated with puffiness of right forearm Extremities: DC right arm IV and arterial line Wound: Sternum is stable  Lab Results: CBC: Recent Labs    06/24/17 1904 06/24/17 1911 06/25/17 0346  WBC 10.7*  --  11.1*  HGB 10.5* 9.9* 10.6*  HCT 30.7* 29.0* 31.4*  PLT 153  --  152   BMET:  Recent Labs    06/24/17 1911 06/25/17 0346  NA 143 141  K 4.2 4.3  CL 105  109  CO2  --  24  GLUCOSE 156* 136*  BUN 17 14  CREATININE 1.30* 1.48*  CALCIUM  --  8.0*    CMET: Lab Results  Component Value Date   WBC 11.1 (H) 06/25/2017   HGB 10.6 (L) 06/25/2017   HCT 31.4 (L) 06/25/2017   PLT 152 06/25/2017   GLUCOSE 136 (H) 06/25/2017   CHOL 159 05/12/2017   TRIG 166 (H) 05/12/2017   HDL 45 05/12/2017   LDLCALC 81 05/12/2017   ALT 20 06/20/2017   AST 21 06/20/2017   NA 141 06/25/2017   K 4.3 06/25/2017   CL 109 06/25/2017   CREATININE 1.48 (H) 06/25/2017   BUN 14 06/25/2017   CO2 24 06/25/2017   TSH 3.244 11/22/2014   PSA 0.88 06/03/2015   INR 1.35 06/24/2017   HGBA1C 8.2 (H) 06/20/2017   MICROALBUR 39.6 08/25/2015    PT/INR:  Recent Labs    06/24/17 1252  LABPROT 16.5*  INR 1.35   Radiology: Dg Chest Port 1 View  Result Date: 06/25/2017 CLINICAL DATA:  Post CABG EXAM: PORTABLE CHEST 1 VIEW COMPARISON:  06/24/2017 FINDINGS: Interval extubation and removal of NG tube. Swan-Ganz catheter appears to coil in the right ventricle. Left chest tube in place without pneumothorax. Changes of CABG. Cardiomegaly. Low lung volumes with bibasilar atelectasis. IMPRESSION: Swan-Ganz catheter coils in the right ventricle. Cardiomegaly, vascular congestion and bibasilar atelectasis. Electronically Signed   By: Rolm Baptise M.D.   On: 06/25/2017 07:42   Dg Chest Port 1 View  Result Date: 06/24/2017 CLINICAL DATA:  Status post CABG today. EXAM: PORTABLE CHEST 1 VIEW COMPARISON:  PA and lateral chest 06/20/2017. FINDINGS: Endotracheal tube is in place with tip in good position just below the clavicular heads. Right IJ approach Swan-Ganz catheter tip is looped in the proximal pulmonary outflow tract with the tip projecting retrograde. Provided history indicates the catheter has been pulled back. Mediastinal drain and left chest tube are noted. Mild bibasilar atelectasis. No edema or pneumothorax. IMPRESSION: Support tubes and lines as described above. Negative for  pneumothorax with a left chest tube in place. As noted above, parotid history indicates the patient Swan-Ganz catheter was repositioned prior to this film's interpretation. Mild left basilar atelectasis. Electronically Signed   By: Inge Rise M.D.   On: 06/24/2017 13:20   Chronic Kidney Disease   Stage I     GFR >90  Stage II    GFR 60-89  Stage IIIA GFR 45-59  Stage IIIB GFR 30-44  Stage IV   GFR 15-29  Stage V    GFR  <15  Lab Results  Component Value Date   CREATININE 1.48 (H) 06/25/2017   Estimated Creatinine  Clearance: 46.8 mL/min (A) (by C-G formula based on SCr of 1.48 mg/dL (H)).   Assessment/Plan: S/P Procedure(s) (LRB): CORONARY ARTERY BYPASS GRAFTING (CABG) x 3 ; Using Left Internal Mammary Artery and Right Great Saphenous Leg Vein Harvested Endoscopically (N/A) TRANSESOPHAGEAL ECHOCARDIOGRAM (TEE) (N/A) Mobilize Diuresis Diabetes control Continue foley due to strict I&O and urinary output monitoring See progression orders Expected Acute  Blood - loss Anemia- continue to monitor  Creatinine at baseline with stage III chronic kidney disease Loaded with amiodarone with episode of SVT this a.m.    [name] 06/25/2017 8:18 AM

## 2017-06-25 NOTE — Progress Notes (Signed)
  Amiodarone Drug - Drug Interaction Consult Note  Recommendations: No medication changes recommended at this time S/p Levofloxacin x2 doses pre/post op Atorvastatin dose ok  Furosemide 40iv x1 > K 4 ok   Amiodarone is metabolized by the cytochrome P450 system and therefore has the potential to cause many drug interactions. Amiodarone has an average plasma half-life of 50 days (range 20 to 100 days).   There is potential for drug interactions to occur several weeks or months after stopping treatment and the onset of drug interactions may be slow after initiating amiodarone.   [x]  Statins: Increased risk of myopathy. Simvastatin- restrict dose to 20mg  daily. Other statins: counsel patients to report any muscle pain or weakness immediately.  []  Anticoagulants: Amiodarone can increase anticoagulant effect. Consider warfarin dose reduction. Patients should be monitored closely and the dose of anticoagulant altered accordingly, remembering that amiodarone levels take several weeks to stabilize.  []  Antiepileptics: Amiodarone can increase plasma concentration of phenytoin, the dose should be reduced. Note that small changes in phenytoin dose can result in large changes in levels. Monitor patient and counsel on signs of toxicity.  [x]  Beta blockers: increased risk of bradycardia, AV block and myocardial depression. Sotalol - avoid concomitant use.  []   Calcium channel blockers (diltiazem and verapamil): increased risk of bradycardia, AV block and myocardial depression.  []   Cyclosporine: Amiodarone increases levels of cyclosporine. Reduced dose of cyclosporine is recommended.  []  Digoxin dose should be halved when amiodarone is started.  [x]  Diuretics: increased risk of cardiotoxicity if hypokalemia occurs.  []  Oral hypoglycemic agents (glyburide, glipizide, glimepiride): increased risk of hypoglycemia. Patient's glucose levels should be monitored closely when initiating amiodarone therapy.    [x]  Drugs that prolong the QT interval:  Torsades de pointes risk may be increased with concurrent use - avoid if possible.  Monitor QTc, also keep magnesium/potassium WNL if concurrent therapy can't be avoided. Marland Kitchen Antibiotics: e.g. fluoroquinolones, erythromycin. . Antiarrhythmics: e.g. quinidine, procainamide, disopyramide, sotalol. . Antipsychotics: e.g. phenothiazines, haloperidol.  . Lithium, tricyclic antidepressants, and methadone.   Bonnita Nasuti Pharm.D. CPP, BCPS Clinical Pharmacist [phone] 06/25/2017 8:51 AM

## 2017-06-26 ENCOUNTER — Inpatient Hospital Stay (HOSPITAL_COMMUNITY): Payer: Medicare Other

## 2017-06-26 ENCOUNTER — Encounter (HOSPITAL_COMMUNITY): Payer: Self-pay | Admitting: Surgery

## 2017-06-26 LAB — GLUCOSE, CAPILLARY
GLUCOSE-CAPILLARY: 112 mg/dL — AB (ref 65–99)
GLUCOSE-CAPILLARY: 134 mg/dL — AB (ref 65–99)
GLUCOSE-CAPILLARY: 139 mg/dL — AB (ref 65–99)
GLUCOSE-CAPILLARY: 208 mg/dL — AB (ref 65–99)
Glucose-Capillary: 166 mg/dL — ABNORMAL HIGH (ref 65–99)

## 2017-06-26 LAB — BASIC METABOLIC PANEL
Anion gap: 8 (ref 5–15)
BUN: 19 mg/dL (ref 6–20)
CO2: 26 mmol/L (ref 22–32)
Calcium: 8.4 mg/dL — ABNORMAL LOW (ref 8.9–10.3)
Chloride: 106 mmol/L (ref 101–111)
Creatinine, Ser: 1.71 mg/dL — ABNORMAL HIGH (ref 0.61–1.24)
GFR calc Af Amer: 45 mL/min — ABNORMAL LOW (ref >60)
GFR calc non Af Amer: 39 mL/min — ABNORMAL LOW (ref >60)
Glucose, Bld: 141 mg/dL — ABNORMAL HIGH (ref 65–99)
Potassium: 4.2 mmol/L (ref 3.5–5.1)
Sodium: 140 mmol/L (ref 135–145)

## 2017-06-26 LAB — CBC
HCT: 32.4 % — ABNORMAL LOW (ref 39.0–52.0)
Hemoglobin: 10.8 g/dL — ABNORMAL LOW (ref 13.0–17.0)
MCH: 30.4 pg (ref 26.0–34.0)
MCHC: 33.3 g/dL (ref 30.0–36.0)
MCV: 91.3 fL (ref 78.0–100.0)
Platelets: 144 10*3/uL — ABNORMAL LOW (ref 150–400)
RBC: 3.55 MIL/uL — ABNORMAL LOW (ref 4.22–5.81)
RDW: 13.8 % (ref 11.5–15.5)
WBC: 10.3 10*3/uL (ref 4.0–10.5)

## 2017-06-26 LAB — MAGNESIUM: Magnesium: 2.3 mg/dL (ref 1.7–2.4)

## 2017-06-26 MED ORDER — FUROSEMIDE 10 MG/ML IJ SOLN
40.0000 mg | Freq: Once | INTRAMUSCULAR | Status: AC
  Administered 2017-06-26: 40 mg via INTRAVENOUS
  Filled 2017-06-26: qty 4

## 2017-06-26 NOTE — Progress Notes (Signed)
Patient ID: Antonio Wells, male   DOB: 07-Feb-1946, 71 y.o.   MRN: [id number] EVENING ROUNDS NOTE :     [address]       [place] [address]             [phone]                 2 Days Post-Op Procedure(s) (LRB): CORONARY ARTERY BYPASS GRAFTING (CABG) x 3 ; Using Left Internal Mammary Artery and Right Great Saphenous Leg Vein Harvested Endoscopically (N/A) TRANSESOPHAGEAL ECHOCARDIOGRAM (TEE) (N/A)  Total Length of Stay:  LOS: 2 days  BP (!) 148/78 (BP Location: Left Arm)   Pulse 94   Temp 98.4 F (36.9 C) (Oral)   Resp 18   Wt 202 lb 9.6 oz (91.9 kg)   SpO2 94%   BMI 35.89 kg/m   .Intake/Output      12/01 0701 - 12/02 0700 12/02 0701 - 12/03 0700   P.O. 600    I.V. (mL/kg) 927.8 (10.1) 65 (0.7)   Blood     NG/GT     IV Piggyback     Total Intake(mL/kg) 1527.8 (16.6) 65 (0.7)   Urine (mL/kg/hr) 1450 (0.7) 675 (0.7)   Emesis/NG output     Blood     Chest Tube 160 30   Total Output 1610 705   Net -82.2 -640.1          . sodium chloride Stopped (06/25/17 0900)  . sodium chloride Stopped (06/25/17 0900)  . sodium chloride Stopped (06/25/17 0900)  . dexmedetomidine (PRECEDEX) IV infusion Stopped (06/24/17 1430)  . insulin (NOVOLIN-R) infusion Stopped (06/25/17 1420)  . lactated ringers    . lactated ringers Stopped (06/25/17 0900)  . lactated ringers Stopped (06/26/17 1000)  . nitroGLYCERIN    . phenylephrine (NEO-SYNEPHRINE) Adult infusion Stopped (06/24/17 2200)     Lab Results  Component Value Date   WBC 10.3 06/26/2017   HGB 10.8 (L) 06/26/2017   HCT 32.4 (L) 06/26/2017   PLT 144 (L) 06/26/2017   GLUCOSE 141 (H) 06/26/2017   CHOL 159 05/12/2017   TRIG 166 (H) 05/12/2017   HDL 45 05/12/2017   LDLCALC 81 05/12/2017   ALT 20 06/20/2017   AST 21 06/20/2017   NA 140 06/26/2017   K 4.2 06/26/2017   CL 106 06/26/2017   CREATININE 1.71 (H) 06/26/2017   BUN 19 06/26/2017   CO2 26 06/26/2017   TSH 0.614 06/25/2017   PSA 0.88  06/03/2015   INR 1.35 06/24/2017   HGBA1C 8.2 (H) 06/20/2017   MICROALBUR 39.6 08/25/2015   675 uop, d/c foley in am if renal function stable Likely step down in am   [name] MD  Beeper [phone] Office [phone] 06/26/2017 4:53 PM

## 2017-06-26 NOTE — Progress Notes (Signed)
Patient ID: Antonio Wells, male   DOB: [birth year], 71 y.o.   MRN: [id number] TCTS DAILY ICU PROGRESS NOTE                   [address]            [place] [address]          [phone]   2 Days Post-Op Procedure(s) (LRB): CORONARY ARTERY BYPASS GRAFTING (CABG) x 3 ; Using Left Internal Mammary Artery and Right Great Saphenous Leg Vein Harvested Endoscopically (N/A) TRANSESOPHAGEAL ECHOCARDIOGRAM (TEE) (N/A)  Total Length of Stay:  LOS: 2 days   Subjective: Walked around complete unit this morning, denies shortness of breath  Objective: Vital signs in last 24 hours: Temp:  [97.7 F (36.5 C)-98.6 F (37 C)] 97.9 F (36.6 C) (12/02 0814) Pulse Rate:  [84-99] 99 (12/02 0800) Cardiac Rhythm: Normal sinus rhythm (12/02 0800) Resp:  [10-25] 14 (12/02 0800) BP: (102-152)/(65-86) 125/65 (12/02 0800) SpO2:  [89 %-100 %] 97 % (12/02 0800) Arterial Line BP: (127)/(56) 127/56 (12/01 0900) Weight:  [202 lb 9.6 oz (91.9 kg)] 202 lb 9.6 oz (91.9 kg) (12/02 0500)  Filed Weights   06/24/17 1900 06/25/17 0600 06/26/17 0500  Weight: 195 lb 1.7 oz (88.5 kg) 204 lb 12.9 oz (92.9 kg) 202 lb 9.6 oz (91.9 kg)    Weight change: 7 lb 7.9 oz (3.4 kg)   Hemodynamic parameters for last 24 hours: PAP: (36-41)/(2-8) 41/8  Intake/Output from previous day: 12/01 0701 - 12/02 0700 In: 1527.8 [P.O.:600; I.V.:927.8] Out: 1610 [Urine:1450; Chest Tube:160]  Intake/Output this shift: Total I/O In: 23.3 [I.V.:23.3] Out: 50 [Urine:50]  Current Meds: Scheduled Meds: . acetaminophen  1,000 mg Oral Q6H   Or  . acetaminophen (TYLENOL) oral liquid 160 mg/5 mL  1,000 mg Per Tube Q6H  . amiodarone  400 mg Oral Q12H   Followed by  . [START ON 07/02/2017] amiodarone  400 mg Oral Daily  . aspirin EC  325 mg Oral Daily   Or  . aspirin  324 mg Per Tube Daily  . atorvastatin  80 mg Oral q1800  . bisacodyl  10 mg Oral Daily   Or  . bisacodyl  10 mg Rectal Daily  . docusate sodium  200  mg Oral Daily  . enoxaparin (LOVENOX) injection  30 mg Subcutaneous QHS  . insulin aspart  0-24 Units Subcutaneous Q4H  . insulin detemir  15 Units Subcutaneous Daily  . insulin regular  0-10 Units Intravenous TID WC  . mouth rinse  15 mL Mouth Rinse BID  . metoprolol tartrate  12.5 mg Oral BID   Or  . metoprolol tartrate  12.5 mg Per Tube BID  . pantoprazole  40 mg Oral Daily  . sodium chloride flush  3 mL Intravenous Q12H   Continuous Infusions: . sodium chloride Stopped (06/25/17 0900)  . sodium chloride Stopped (06/25/17 0900)  . sodium chloride Stopped (06/25/17 0900)  . dexmedetomidine (PRECEDEX) IV infusion Stopped (06/24/17 1430)  . DOPamine 2 mcg/kg/min (06/25/17 1900)  . insulin (NOVOLIN-R) infusion Stopped (06/25/17 1420)  . lactated ringers    . lactated ringers Stopped (06/25/17 0900)  . lactated ringers 20 mL/hr at 06/25/17 1900  . nitroGLYCERIN    . phenylephrine (NEO-SYNEPHRINE) Adult infusion Stopped (06/24/17 2200)   PRN Meds:.sodium chloride, lactated ringers, metoprolol tartrate, midazolam, morphine injection, ondansetron (ZOFRAN) IV, oxyCODONE, sodium chloride flush  General appearance: alert, cooperative, appears stated age and no distress Neurologic:  intact Heart: regular rate and rhythm, S1, S2 normal, no murmur, click, rub or gallop Lungs: diminished breath sounds bibasilar Abdomen: soft, non-tender; bowel sounds normal; no masses,  no organomegaly Extremities: extremities normal, atraumatic, no cyanosis or edema and Homans sign is negative, no sign of DVT Wound: Sternum stable  Lab Results: CBC: Recent Labs    06/25/17 1657 06/25/17 1717 06/26/17 0536  WBC 10.9*  --  10.3  HGB 11.0* 11.6* 10.8*  HCT 32.7* 34.0* 32.4*  PLT 149*  --  144*   BMET:  Recent Labs    06/25/17 0346  06/25/17 1717 06/26/17 0536  NA 141  --  142 140  K 4.3  --  4.3 4.2  CL 109  --  103 106  CO2 24  --   --  26  GLUCOSE 136*  --  171* 141*  BUN 14  --  16 19    CREATININE 1.48*   < > 1.60* 1.71*  CALCIUM 8.0*  --   --  8.4*   < > = values in this interval not displayed.    CMET: Lab Results  Component Value Date   WBC 10.3 06/26/2017   HGB 10.8 (L) 06/26/2017   HCT 32.4 (L) 06/26/2017   PLT 144 (L) 06/26/2017   GLUCOSE 141 (H) 06/26/2017   CHOL 159 05/12/2017   TRIG 166 (H) 05/12/2017   HDL 45 05/12/2017   LDLCALC 81 05/12/2017   ALT 20 06/20/2017   AST 21 06/20/2017   NA 140 06/26/2017   K 4.2 06/26/2017   CL 106 06/26/2017   CREATININE 1.71 (H) 06/26/2017   BUN 19 06/26/2017   CO2 26 06/26/2017   TSH 0.614 06/25/2017   PSA 0.88 06/03/2015   INR 1.35 06/24/2017   HGBA1C 8.2 (H) 06/20/2017   MICROALBUR 39.6 08/25/2015      PT/INR:  Recent Labs    06/24/17 1252  LABPROT 16.5*  INR 1.35   Radiology: Dg Chest Port 1 View  Result Date: 06/26/2017 CLINICAL DATA:  Chest tube EXAM: PORTABLE CHEST 1 VIEW COMPARISON:  06/25/2017 FINDINGS: Interval removal of Swan-Ganz catheter. Left chest tube remains in place. No pneumothorax. Cardiomegaly with vascular congestion and bibasilar atelectasis. IMPRESSION: Cardiomegaly, vascular congestion and bibasilar atelectasis, similar to prior study. Electronically Signed   By: Rolm Baptise M.D.   On: 06/26/2017 07:16   Chronic Kidney Disease   Stage I     GFR >90  Stage II    GFR 60-89  Stage IIIA GFR 45-59  Stage IIIB GFR 30-44  Stage IV   GFR 15-29  Stage V    GFR  <15  Lab Results  Component Value Date   CREATININE 1.71 (H) 06/26/2017   Estimated Creatinine Clearance: 40.3 mL/min (A) (by C-G formula based on SCr of 1.71 mg/dL (H)).  Baseline creatinine between 1.6 and 1.99 in October 2018  Assessment/Plan: S/P Procedure(s) (LRB): CORONARY ARTERY BYPASS GRAFTING (CABG) x 3 ; Using Left Internal Mammary Artery and Right Great Saphenous Leg Vein Harvested Endoscopically (N/A) TRANSESOPHAGEAL ECHOCARDIOGRAM (TEE) (N/A) Mobilize Diuresis Diabetes control Stage IIIb chronic  kidney disease preop-  Patient notes frequent nocturia, preop.  With baseline elevated creatinine, will leave Foley 1 more day monitor of urine output,  Wean off dopamine On p.o. Amiodarone, no further atrial fib/SVT  Antonio Wells 06/26/2017 8:20 AM

## 2017-06-27 ENCOUNTER — Inpatient Hospital Stay (HOSPITAL_COMMUNITY): Payer: Medicare Other

## 2017-06-27 ENCOUNTER — Encounter (HOSPITAL_COMMUNITY): Payer: Self-pay

## 2017-06-27 ENCOUNTER — Other Ambulatory Visit: Payer: Self-pay

## 2017-06-27 LAB — GLUCOSE, CAPILLARY
GLUCOSE-CAPILLARY: 127 mg/dL — AB (ref 65–99)
GLUCOSE-CAPILLARY: 160 mg/dL — AB (ref 65–99)
GLUCOSE-CAPILLARY: 183 mg/dL — AB (ref 65–99)
GLUCOSE-CAPILLARY: 149 mg/dL — AB (ref 65–99)
GLUCOSE-CAPILLARY: 152 mg/dL — AB (ref 65–99)
Glucose-Capillary: 179 mg/dL — ABNORMAL HIGH (ref 65–99)
Glucose-Capillary: 93 mg/dL (ref 65–99)
Glucose-Capillary: 94 mg/dL (ref 65–99)

## 2017-06-27 LAB — CBC
HCT: 29.7 % — ABNORMAL LOW (ref 39.0–52.0)
Hemoglobin: 9.8 g/dL — ABNORMAL LOW (ref 13.0–17.0)
MCH: 30.3 pg (ref 26.0–34.0)
MCHC: 33 g/dL (ref 30.0–36.0)
MCV: 92 fL (ref 78.0–100.0)
Platelets: 158 10*3/uL (ref 150–400)
RBC: 3.23 MIL/uL — ABNORMAL LOW (ref 4.22–5.81)
RDW: 14 % (ref 11.5–15.5)
WBC: 8.1 10*3/uL (ref 4.0–10.5)

## 2017-06-27 LAB — BASIC METABOLIC PANEL
Anion gap: 8 (ref 5–15)
BUN: 21 mg/dL — ABNORMAL HIGH (ref 6–20)
CO2: 28 mmol/L (ref 22–32)
Calcium: 8.2 mg/dL — ABNORMAL LOW (ref 8.9–10.3)
Chloride: 106 mmol/L (ref 101–111)
Creatinine, Ser: 1.63 mg/dL — ABNORMAL HIGH (ref 0.61–1.24)
GFR calc Af Amer: 48 mL/min — ABNORMAL LOW (ref >60)
GFR calc non Af Amer: 41 mL/min — ABNORMAL LOW (ref >60)
Glucose, Bld: 130 mg/dL — ABNORMAL HIGH (ref 65–99)
Potassium: 3.6 mmol/L (ref 3.5–5.1)
Sodium: 142 mmol/L (ref 135–145)

## 2017-06-27 MED ORDER — SODIUM CHLORIDE 0.9% FLUSH
3.0000 mL | Freq: Two times a day (BID) | INTRAVENOUS | Status: DC
  Administered 2017-06-27 – 2017-06-28 (×3): 3 mL via INTRAVENOUS

## 2017-06-27 MED ORDER — SODIUM CHLORIDE 0.9 % IV SOLN: 250.0000 mL | INTRAVENOUS | Status: DC | PRN

## 2017-06-27 MED ORDER — POTASSIUM CHLORIDE CRYS ER 20 MEQ PO TBCR
20.0000 meq | EXTENDED_RELEASE_TABLET | Freq: Every day | ORAL | Status: DC
  Administered 2017-06-27 – 2017-06-29 (×3): 20 meq via ORAL
  Filled 2017-06-27 (×3): qty 1

## 2017-06-27 MED ORDER — MOVING RIGHT ALONG BOOK
Freq: Once | Status: AC
  Administered 2017-06-27: 1
  Filled 2017-06-27: qty 1

## 2017-06-27 MED ORDER — ASPIRIN EC 325 MG PO TBEC
325.0000 mg | DELAYED_RELEASE_TABLET | Freq: Every day | ORAL | Status: DC
  Administered 2017-06-28: 325 mg via ORAL
  Filled 2017-06-27: qty 1

## 2017-06-27 MED ORDER — FUROSEMIDE 40 MG PO TABS
40.0000 mg | ORAL_TABLET | Freq: Every day | ORAL | Status: DC
  Administered 2017-06-27 – 2017-06-28 (×2): 40 mg via ORAL
  Filled 2017-06-27 (×2): qty 1

## 2017-06-27 MED ORDER — METOPROLOL TARTRATE 50 MG PO TABS
50.0000 mg | ORAL_TABLET | Freq: Two times a day (BID) | ORAL | Status: DC
  Administered 2017-06-27 – 2017-06-28 (×3): 50 mg via ORAL
  Filled 2017-06-27 (×3): qty 1

## 2017-06-27 MED ORDER — BISACODYL 10 MG RE SUPP: 10.0000 mg | Freq: Every day | RECTAL | Status: DC | PRN

## 2017-06-27 MED ORDER — SODIUM CHLORIDE 0.9% FLUSH: 3.0000 mL | INTRAVENOUS | Status: DC | PRN

## 2017-06-27 MED ORDER — OXYCODONE HCL 5 MG PO TABS
5.0000 mg | ORAL_TABLET | ORAL | Status: DC | PRN
  Administered 2017-06-27: 5 mg via ORAL
  Filled 2017-06-27: qty 1

## 2017-06-27 MED ORDER — METOPROLOL TARTRATE 25 MG/10 ML ORAL SUSPENSION: 25.0000 mg | Freq: Two times a day (BID) | ORAL | Status: DC

## 2017-06-27 MED ORDER — INSULIN ASPART 100 UNIT/ML ~~LOC~~ SOLN
0.0000 [IU] | Freq: Three times a day (TID) | SUBCUTANEOUS | Status: DC
  Administered 2017-06-27 (×2): 2 [IU] via SUBCUTANEOUS
  Administered 2017-06-28 (×2): 4 [IU] via SUBCUTANEOUS
  Administered 2017-06-28: 8 [IU] via SUBCUTANEOUS

## 2017-06-27 MED ORDER — ONDANSETRON HCL 4 MG PO TABS
4.0000 mg | ORAL_TABLET | Freq: Four times a day (QID) | ORAL | Status: DC | PRN
  Administered 2017-06-28: 4 mg via ORAL
  Filled 2017-06-27: qty 1

## 2017-06-27 MED ORDER — METOPROLOL TARTRATE 25 MG PO TABS
25.0000 mg | ORAL_TABLET | Freq: Two times a day (BID) | ORAL | Status: DC
  Administered 2017-06-27: 25 mg via ORAL
  Filled 2017-06-27: qty 1

## 2017-06-27 MED ORDER — DOCUSATE SODIUM 100 MG PO CAPS
200.0000 mg | ORAL_CAPSULE | Freq: Every day | ORAL | Status: DC
  Administered 2017-06-28: 200 mg via ORAL
  Filled 2017-06-27: qty 2

## 2017-06-27 MED ORDER — ONDANSETRON HCL 4 MG/2ML IJ SOLN: 4.0000 mg | Freq: Four times a day (QID) | INTRAMUSCULAR | Status: DC | PRN

## 2017-06-27 MED ORDER — ORAL CARE MOUTH RINSE
15.0000 mL | Freq: Two times a day (BID) | OROMUCOSAL | Status: DC
  Administered 2017-06-28: 15 mL via OROMUCOSAL

## 2017-06-27 MED ORDER — PANTOPRAZOLE SODIUM 40 MG PO TBEC
40.0000 mg | DELAYED_RELEASE_TABLET | Freq: Every day | ORAL | Status: DC
  Administered 2017-06-28 – 2017-06-29 (×2): 40 mg via ORAL
  Filled 2017-06-27 (×2): qty 1

## 2017-06-27 MED ORDER — ACETAMINOPHEN 325 MG PO TABS: 650.0000 mg | ORAL_TABLET | Freq: Four times a day (QID) | ORAL | Status: DC | PRN

## 2017-06-27 MED ORDER — BISACODYL 5 MG PO TBEC
10.0000 mg | DELAYED_RELEASE_TABLET | Freq: Every day | ORAL | Status: DC | PRN
  Filled 2017-06-27: qty 2

## 2017-06-27 MED FILL — Heparin Sodium (Porcine) Inj 1000 Unit/ML: INTRAMUSCULAR | Qty: 30 | Status: AC

## 2017-06-27 MED FILL — Magnesium Sulfate Inj 50%: INTRAMUSCULAR | Qty: 10 | Status: AC

## 2017-06-27 MED FILL — Potassium Chloride Inj 2 mEq/ML: INTRAVENOUS | Qty: 20 | Status: AC

## 2017-06-27 NOTE — Plan of Care (Signed)
Pt. Progressing clinically, MD orders to transfer to 4E.

## 2017-06-27 NOTE — Care Management Note (Addendum)
Case Management Note  Patient Details  Name: Antonio Wells MRN: [id number] Date of Birth: [birth year]  Subjective/Objective:  From home with wife who is there 24/7 and able to assist patient. He did not use any assistive devices at home pta.  He is POD 3 CABG with post op afib, on amio po, cont to diuresis and ambulate. He has PCP and medication coverage per patient.  They have a daughter who lives in [place] , a daughter who lives in [place] and one who lives in [place].                   Action/Plan: NCM will follow for dc needs.   Expected Discharge Date:  07/01/17               Expected Discharge Plan:  Home/Self Care  In-House Referral:     Discharge planning Services  CM Consult  Post Acute Care Choice:    Choice offered to:     DME Arranged:    DME Agency:     HH Arranged:    HH Agency:     Status of Service:  In process, will continue to follow  If discussed at Long Length of Stay Meetings, dates discussed:    Additional Comments:  Zenon Mayo, RN 06/27/2017, 12:17 PM

## 2017-06-27 NOTE — Progress Notes (Signed)
3 Days Post-Op Procedure(s) (LRB): CORONARY ARTERY BYPASS GRAFTING (CABG) x 3 ; Using Left Internal Mammary Artery and Right Great Saphenous Leg Vein Harvested Endoscopically (N/A) TRANSESOPHAGEAL ECHOCARDIOGRAM (TEE) (N/A) Subjective: Sore but otherwise ok  Objective: Vital signs in last 24 hours: Temp:  [97.9 F (36.6 C)-99.4 F (37.4 C)] 98.2 F (36.8 C) (12/03 0500) Pulse Rate:  [86-107] 91 (12/03 0400) Cardiac Rhythm: Sinus tachycardia (12/02 2000) Resp:  [13-24] 15 (12/03 0400) BP: (125-154)/(65-85) 136/71 (12/03 0400) SpO2:  [92 %-100 %] 92 % (12/03 0400)  Hemodynamic parameters for last 24 hours:    Intake/Output from previous day: 12/02 0701 - 12/03 0700 In: 425 [P.O.:360; I.V.:65] Out: 1500 [Urine:1470; Chest Tube:30] Intake/Output this shift: No intake/output data recorded.  General appearance: alert and cooperative Neurologic: intact Heart: regular rate and rhythm, S1, S2 normal, no murmur, click, rub or gallop Lungs: clear to auscultation bilaterally Extremities: edema mild diffuse Wound: dressing dry  Lab Results: Recent Labs    06/26/17 0536 06/27/17 0425  WBC 10.3 8.1  HGB 10.8* 9.8*  HCT 32.4* 29.7*  PLT 144* 158   BMET:  Recent Labs    06/26/17 0536 06/27/17 0425  NA 140 142  K 4.2 3.6  CL 106 106  CO2 26 28  GLUCOSE 141* 130*  BUN 19 21*  CREATININE 1.71* 1.63*  CALCIUM 8.4* 8.2*    PT/INR:  Recent Labs    06/24/17 1252  LABPROT 16.5*  INR 1.35   ABG    Component Value Date/Time   PHART 7.361 06/24/2017 1919   HCO3 26.2 06/24/2017 1919   TCO2 24 06/25/2017 1717   O2SAT 94.0 06/24/2017 1919   CBG (last 3)  Recent Labs    06/26/17 2011 06/26/17 2330 06/27/17 0421  GLUCAP 208* 139* 127*   CXR: ok  Assessment/Plan: S/P Procedure(s) (LRB): CORONARY ARTERY BYPASS GRAFTING (CABG) x 3 ; Using Left Internal Mammary Artery and Right Great Saphenous Leg Vein Harvested Endoscopically (N/A) TRANSESOPHAGEAL ECHOCARDIOGRAM  (TEE) (N/A)  POD 3  He is hemodynamically stable with tendency to HTN so will increase Lopressor to 50 bid which was his preop dose. This will also help with the atrial fib.  Postop atrial fibrillation: now sinus on amio. Continue po  CKD: creat baseline 1.3-1.4, peak 1.7, now 1.63. Follow.  Volume excess: weight is 7.5 lbs over preop. Will resume daily lasix.  DM: glucose under reasonable control on Levemir and SSI. Preop Hgb A1c was 8.2 so he needs better long term control.  DC sleeve and foley and transfer to 4E. Continue IS and ambulation.   LOS: 3 days    Gaye Pollack 06/27/2017

## 2017-06-28 ENCOUNTER — Encounter (HOSPITAL_COMMUNITY): Payer: Self-pay | Admitting: *Deleted

## 2017-06-28 LAB — BASIC METABOLIC PANEL
ANION GAP: 11 (ref 5–15)
BUN: 22 mg/dL — AB (ref 6–20)
CHLORIDE: 104 mmol/L (ref 101–111)
CO2: 27 mmol/L (ref 22–32)
Calcium: 8.5 mg/dL — ABNORMAL LOW (ref 8.9–10.3)
Creatinine, Ser: 1.68 mg/dL — ABNORMAL HIGH (ref 0.61–1.24)
GFR calc Af Amer: 46 mL/min — ABNORMAL LOW (ref >60)
GFR, EST NON AFRICAN AMERICAN: 40 mL/min — AB (ref >60)
Glucose, Bld: 153 mg/dL — ABNORMAL HIGH (ref 65–99)
POTASSIUM: 3.7 mmol/L (ref 3.5–5.1)
SODIUM: 142 mmol/L (ref 135–145)

## 2017-06-28 LAB — GLUCOSE, CAPILLARY
GLUCOSE-CAPILLARY: 165 mg/dL — AB (ref 65–99)
GLUCOSE-CAPILLARY: 143 mg/dL — AB (ref 65–99)
Glucose-Capillary: 225 mg/dL — ABNORMAL HIGH (ref 65–99)
Glucose-Capillary: 116 mg/dL — ABNORMAL HIGH (ref 65–99)

## 2017-06-28 MED ORDER — METFORMIN HCL 500 MG PO TABS
1000.0000 mg | ORAL_TABLET | Freq: Two times a day (BID) | ORAL | Status: DC
  Administered 2017-06-28 – 2017-06-29 (×3): 1000 mg via ORAL
  Filled 2017-06-28 (×3): qty 2

## 2017-06-28 MED ORDER — AMLODIPINE BESYLATE 5 MG PO TABS
5.0000 mg | ORAL_TABLET | Freq: Every day | ORAL | Status: DC
  Administered 2017-06-28: 5 mg via ORAL
  Filled 2017-06-28: qty 1

## 2017-06-28 MED FILL — Electrolyte-R (PH 7.4) Solution: INTRAVENOUS | Qty: 4000 | Status: AC

## 2017-06-28 MED FILL — Thrombin (Recombinant) For Soln 5000 Unit: CUTANEOUS | Qty: 5000 | Status: AC

## 2017-06-28 MED FILL — Lidocaine HCl IV Inj 20 MG/ML: INTRAVENOUS | Qty: 5 | Status: AC

## 2017-06-28 MED FILL — Sodium Chloride IV Soln 0.9%: INTRAVENOUS | Qty: 2000 | Status: AC

## 2017-06-28 MED FILL — Mannitol IV Soln 20%: INTRAVENOUS | Qty: 500 | Status: AC

## 2017-06-28 MED FILL — Sodium Bicarbonate IV Soln 8.4%: INTRAVENOUS | Qty: 50 | Status: AC

## 2017-06-28 MED FILL — Heparin Sodium (Porcine) Inj 1000 Unit/ML: INTRAMUSCULAR | Qty: 10 | Status: AC

## 2017-06-28 NOTE — Discharge Instructions (Signed)
Endoscopic Saphenous Vein Harvesting, Care After °Refer to this sheet in the next few weeks. These instructions provide you with information about caring for yourself after your procedure. Your health care provider may also give you more specific instructions. Your treatment has been planned according to current medical practices, but problems sometimes occur. Call your health care provider if you have any problems or questions after your procedure. °What can I expect after the procedure? °After the procedure, it is common to have: °· Pain. °· Bruising. °· Swelling. °· Numbness. ° °Follow these instructions at home: °Medicine °· Take over-the-counter and prescription medicines only as told by your health care provider. °· Do not drive or operate heavy machinery while taking prescription pain medicine. °Incision care ° °· Follow instructions from your health care provider about how to take care of the cut made during surgery (incision). Make sure you: °? Wash your hands with soap and water before you change your bandage (dressing). If soap and water are not available, use hand sanitizer. °? Change your dressing as told by your health care provider. °? Leave stitches (sutures), skin glue, or adhesive strips in place. These skin closures may need to be in place for 2 weeks or longer. If adhesive strip edges start to loosen and curl up, you may trim the loose edges. Do not remove adhesive strips completely unless your health care provider tells you to do that. °· Check your incision area every day for signs of infection. Check for: °? More redness, swelling, or pain. °? More fluid or blood. °? Warmth. °? Pus or a bad smell. °General instructions °· Raise (elevate) your legs above the level of your heart while you are sitting or lying down. °· Do any exercises your health care providers have given you. These may include deep breathing, coughing, and walking exercises. °· Do not shower, take baths, swim, or use a hot tub  unless told by your health care provider. °· Wear your elastic stocking if told by your health care provider. °· Keep all follow-up visits as told by your health care provider. This is important. °Contact a health care provider if: °· Medicine does not help your pain. °· Your pain gets worse. °· You have new leg bruises or your leg bruises get bigger. °· You have a fever. °· Your leg feels numb. °· You have more redness, swelling, or pain around your incision. °· You have more fluid or blood coming from your incision. °· Your incision feels warm to the touch. °· You have pus or a bad smell coming from your incision. °Get help right away if: °· Your pain is severe. °· You develop pain, tenderness, warmth, redness, or swelling in any part of your leg. °· You have chest pain. °· You have trouble breathing. °This information is not intended to replace advice given to you by your health care provider. Make sure you discuss any questions you have with your health care provider. °Document Released: 03/24/2011 Document Revised: 12/18/2015 Document Reviewed: 05/26/2015 °Elsevier Interactive Patient Education © 2018 [employer] °Coronary Artery Bypass Grafting, Care After °These instructions give you information on caring for yourself after your procedure. Your doctor may also give you more specific instructions. Call your doctor if you have any problems or questions after your procedure. °Follow these instructions at home: °· Only take medicine as told by your doctor. Take medicines exactly as told. Do not stop taking medicines or start any new medicines without talking to your doctor first. °·   Take your pulse as told by your doctor. °· Do deep breathing as told by your doctor. Use your breathing device (incentive spirometer), if given, to practice deep breathing several times a day. Support your chest with a pillow or your arms when you take deep breaths or cough. °· Keep the area clean, dry, and protected where the  surgery cuts (incisions) were made. Remove bandages (dressings) only as told by your doctor. If strips were applied to surgical area, do not take them off. They fall off on their own. °· Check the surgery area daily for puffiness (swelling), redness, or leaking fluid. °· If surgery cuts were made in your legs: °? Avoid crossing your legs. °? Avoid sitting for long periods of time. Change positions every 30 minutes. °? Raise your legs when you are sitting. Place them on pillows. °· Wear stockings that help keep blood clots from forming in your legs (compression stockings). °· Only take sponge baths until your doctor says it is okay to take showers. Pat the surgery area dry. Do not rub the surgery area with a washcloth or towel. Do not bathe, swim, or use a hot tub until your doctor says it is okay. °· Eat foods that are high in fiber. These include raw fruits and vegetables, whole grains, beans, and nuts. Choose lean meats. Avoid canned, processed, and fried foods. °· Drink enough fluids to keep your pee (urine) clear or pale yellow. °· Weigh yourself every day. °· Rest and limit activity as told by your doctor. You may be told to: °? Stop any activity if you have chest pain, shortness of breath, changes in heartbeat, or dizziness. Get help right away if this happens. °? Move around often for short amounts of time or take short walks as told by your doctor. Gradually become more active. You may need help to strengthen your muscles and build endurance. °? Avoid lifting, pushing, or pulling anything heavier than 10 pounds (4.5 kg) for at least 6 weeks after surgery. °· Do not drive until your doctor says it is okay. °· Ask your doctor when you can go back to work. °· Ask your doctor when you can begin sexual activity again. °· Follow up with your doctor as told. °Contact a doctor if: °· You have puffiness, redness, more pain, or fluid draining from the incision site. °· You have a fever. °· You have puffiness in your  ankles or legs. °· You have pain in your legs. °· You gain 2 or more pounds (0.9 kg) a day. °· You feel sick to your stomach (nauseous) or throw up (vomit). °· You have watery poop (diarrhea). °Get help right away if: °· You have chest pain that goes to your jaw or arms. °· You have shortness of breath. °· You have a fast or irregular heartbeat. °· You notice a "clicking" in your breastbone when you move. °· You have numbness or weakness in your arms or legs. °· You feel dizzy or light-headed. °This information is not intended to replace advice given to you by your health care provider. Make sure you discuss any questions you have with your health care provider. °Document Released: 07/17/2013 Document Revised: 12/18/2015 Document Reviewed: 12/19/2012 °Elsevier Interactive Patient Education © 2017 [employer] Inc. ° °

## 2017-06-28 NOTE — Care Management Important Message (Signed)
Important Message  Patient Details  Name: Antonio Wells MRN: [id number] Date of Birth: [birth year]   Medicare Important Message Given:  Yes    [name] Abena 06/28/2017, 9:11 AM

## 2017-06-28 NOTE — Plan of Care (Signed)
  Progressing Education: Knowledge of General Education information will improve 06/28/2017 1822 - Progressing by Milderd Meager, RN Health Behavior/Discharge Planning: Ability to manage health-related needs will improve 06/28/2017 1822 - Progressing by Milderd Meager, RN Clinical Measurements: Ability to maintain clinical measurements within normal limits will improve 06/28/2017 1822 - Progressing by Milderd Meager, RN Will remain free from infection 06/28/2017 1822 - Progressing by Milderd Meager, RN Diagnostic test results will improve 06/28/2017 1822 - Progressing by Milderd Meager, RN Respiratory complications will improve 06/28/2017 1822 - Progressing by Milderd Meager, RN Cardiovascular complication will be avoided 06/28/2017 1822 - Progressing by Milderd Meager, RN Activity: Risk for activity intolerance will decrease 06/28/2017 1822 - Progressing by Milderd Meager, RN Nutrition: Adequate nutrition will be maintained 06/28/2017 1822 - Progressing by Milderd Meager, RN Coping: Level of anxiety will decrease 06/28/2017 1822 - Progressing by Milderd Meager, RN Elimination: Will not experience complications related to bowel motility 06/28/2017 1822 - Progressing by Milderd Meager, RN Will not experience complications related to urinary retention 06/28/2017 1822 - Progressing by Milderd Meager, RN Skin Integrity: Risk for impaired skin integrity will decrease 06/28/2017 1822 - Progressing by Milderd Meager, RN Education: Ability to demonstrate proper wound care will improve 06/28/2017 1822 - Progressing by Milderd Meager, RN Knowledge of disease or condition will improve 06/28/2017 1822 - Progressing by Milderd Meager, RN Knowledge of the prescribed therapeutic regimen will improve 06/28/2017 1822 - Progressing by Milderd Meager, RN Activity: Risk for activity intolerance will decrease 06/28/2017 1822 - Progressing by  Milderd Meager, RN Cardiac: Hemodynamic stability will improve 06/28/2017 1822 - Progressing by Milderd Meager, RN Respiratory: Respiratory status will improve 06/28/2017 1822 - Progressing by Milderd Meager, RN Skin Integrity: Wound healing without signs and symptoms of infection 06/28/2017 1822 - Progressing by Milderd Meager, RN Risk for impaired skin integrity will decrease 06/28/2017 1822 - Progressing by Milderd Meager, RN Urinary Elimination: Ability to achieve and maintain adequate renal perfusion and functioning will improve 06/28/2017 1822 - Progressing by Milderd Meager, RN

## 2017-06-28 NOTE — Progress Notes (Signed)
CARDIAC REHAB PHASE I   PRE:  Rate/Rhythm: 80 SR  BP:  Sitting: 145/79        SaO2: 96 RA  MODE:  Ambulation: 370 ft   POST:  Rate/Rhythm: 91 SR  BP:  Sitting: 153/73         SaO2: 96 RA  Pt ambulated 370 ft on RA, RW, independent, steady gait, tolerated well with no complaints. Pt very steady. Encouraged IS, additional ambulation x2 today. Pt should be safe to ambulate independently and/or with family. Pt states he would like wife present for discharge education. Pt to recliner after walk, feet elevated, call bell within reach. Will follow.   [id number] Lenna Sciara, RN, BSN 06/28/2017 10:47 AM

## 2017-06-28 NOTE — Progress Notes (Addendum)
[address]       [place] [address]             [phone]      4 Days Post-Op Procedure(s) (LRB): CORONARY ARTERY BYPASS GRAFTING (CABG) x 3 ; Using Left Internal Mammary Artery and Right Great Saphenous Leg Vein Harvested Endoscopically (N/A) TRANSESOPHAGEAL ECHOCARDIOGRAM (TEE) (N/A) Subjective: Feels pretty well overall  Objective: Vital signs in last 24 hours: Temp:  [98.2 F (36.8 C)-99.1 F (37.3 C)] 98.3 F (36.8 C) (12/04 0526) Pulse Rate:  [84-99] 95 (12/03 1758) Cardiac Rhythm: Normal sinus rhythm (12/03 2106) Resp:  [14-25] 19 (12/03 1758) BP: (122-173)/(63-93) 145/79 (12/04 0526) SpO2:  [94 %-100 %] 99 % (12/04 0526) Weight:  [195 lb 8 oz (88.7 kg)-197 lb 15.6 oz (89.8 kg)] 195 lb 8 oz (88.7 kg) (12/04 0526)  Hemodynamic parameters for last 24 hours:    Intake/Output from previous day: 12/03 0701 - 12/04 0700 In: 480 [P.O.:480] Out: 975 [Urine:975] Intake/Output this shift: No intake/output data recorded.  General appearance: alert, cooperative and no distress Heart: regular rate and rhythm and 2/6 systolic murmur Lungs: clear to auscultation bilaterally Abdomen: benign Extremities: + LE edema Wound: incis healing well  Lab Results: Recent Labs    06/26/17 0536 06/27/17 0425  WBC 10.3 8.1  HGB 10.8* 9.8*  HCT 32.4* 29.7*  PLT 144* 158   BMET:  Recent Labs    06/27/17 0425 06/28/17 0240  NA 142 142  K 3.6 3.7  CL 106 104  CO2 28 27  GLUCOSE 130* 153*  BUN 21* 22*  CREATININE 1.63* 1.68*  CALCIUM 8.2* 8.5*    PT/INR: No results for input(s): LABPROT, INR in the last 72 hours. ABG    Component Value Date/Time   PHART 7.361 06/24/2017 1919   HCO3 26.2 06/24/2017 1919   TCO2 24 06/25/2017 1717   O2SAT 94.0 06/24/2017 1919   CBG (last 3)  Recent Labs    06/27/17 1639 06/27/17 2031 06/28/17 0615  GLUCAP 149* 152* 165*    Meds Scheduled Meds: . amiodarone  400 mg Oral Q12H   Followed by  . [START  ON 07/02/2017] amiodarone  400 mg Oral Daily  . aspirin EC  325 mg Oral Daily  . atorvastatin  80 mg Oral q1800  . docusate sodium  200 mg Oral Daily  . enoxaparin (LOVENOX) injection  30 mg Subcutaneous QHS  . furosemide  40 mg Oral Daily  . insulin aspart  0-24 Units Subcutaneous TID AC & HS  . insulin detemir  15 Units Subcutaneous Daily  . mouth rinse  15 mL Mouth Rinse BID  . metoprolol tartrate  50 mg Oral BID  . pantoprazole  40 mg Oral QAC breakfast  . potassium chloride  20 mEq Oral Daily  . sodium chloride flush  3 mL Intravenous Q12H   Continuous Infusions: . sodium chloride     PRN Meds:.sodium chloride, acetaminophen, bisacodyl **OR** bisacodyl, ondansetron **OR** ondansetron (ZOFRAN) IV, oxyCODONE, sodium chloride flush  Xrays Dg Chest Port 1 View  Result Date: 06/27/2017 CLINICAL DATA:  Status post CABG, chest tube removal. EXAM: PORTABLE CHEST 1 VIEW COMPARISON:  Portable chest x-ray of June 26, 2017 FINDINGS: There has been interval removal of the left chest tube. The left lung is well-expanded. There remain coarse retrocardiac lung markings medially on the left. There is no pneumothorax or significant pleural effusion. The right lung is mildly hypoinflated with minimal infrahilar density.  The cardiac silhouette is enlarged. The pulmonary vascularity is normal. The sternal wires are intact. TheCordis sheath tip projects over the distal aspect of the right internal jugular vein. Eighteen 10 the midportion of the catheter remains visible. IMPRESSION: Mild bibasilar atelectasis, no pneumothorax or significant pleural effusion since chest tube removal. No pulmonary edema. Electronically Signed   By: David  Martinique M.D.   On: 06/27/2017 07:29    Assessment/Plan: S/P Procedure(s) (LRB): CORONARY ARTERY BYPASS GRAFTING (CABG) x 3 ; Using Left Internal Mammary Artery and Right Great Saphenous Leg Vein Harvested Endoscopically (N/A) TRANSESOPHAGEAL ECHOCARDIOGRAM (TEE) (N/A)  1  doing well overall 2 sinus rhythm on amio/lopressor 3 BUN/Creat pretty stable, some edema, cont gentle diuretic for now. No ACE-ARB with renal issues. + HTN, add norvasc 4 resume po meds for poorly controlled DM- start with metformin as there is room to increase dose and glucotrol could cause hypoglycemia until dietary intake improved, d/c insulin 5 cont pulm toilet/rehab 6 d/c wires in am 7 poss home 1-2 days  LOS: 4 days    Antonio Wells 06/28/2017   Chart reviewed, patient examined, agree with above. He looks good overall. If his creat is stable in the am he could go home.

## 2017-06-28 NOTE — Discharge Summary (Signed)
Physician Discharge Summary  Patient ID: COLLIN RENGEL MRN: [id number] DOB/AGE: 1946/01/25 71 y.o.  Admit date: 06/24/2017 Discharge date: 06/29/2017  Admission Diagnoses:CAD  Discharge Diagnoses:  Active Problems:   S/P CABG x 3   Patient Active Problem List   Diagnosis Date Noted  . 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 CAD (coronary artery disease) 05/25/2017  . Unstable angina ([place])   . Hypertensive heart disease 07/14/2015  . Lower extremity edema 07/14/2015  . Aortic stenosis 01/09/2015  . OSA on CPAP 12/12/2014  . Hypersomnia with sleep apnea 12/12/2014  . Obstructive sleep apnea (adult) (pediatric) 02/04/2014  . 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 Obesity 02/04/2014  . Mitral regurgitation and aortic stenosis 01/02/2014  . DOE (dyspnea on exertion) 11/26/2013  . Gout 08/12/2011  . Hypertension 08/12/2011  . High cholesterol 08/12/2011  . OSA (obstructive sleep apnea) 08/12/2011  . Diabetes type 2, uncontrolled ([place]) 08/12/2011        HPI:  The patient is a 71 year old gentleman with a history of insulin-dependent diabetes for the past 5 years, hypertension, hyperlipidemia, obstructive sleep apnea, prior smoking until 15 years ago, who was evaluated by Dr. Meda Coffee for new onset exertional shortness of breath. He says that he had been previously very active and was riding an exercise bike for up to an hour 3 times per week but now seems to get tired very quickly with shortness of breath. He has had lower extremity swelling. He denies any chest pain or pressure. He has had no pain in his neck, shoulders, or arms. He denies any dizziness or syncope. He had a gated cardiac CT on 05/24/2017 which showed a coronary calcium score of 1348 as well as severe obstructive 2 vessel disease in the ostium and proximal LAD greater than 70%. The left circumflex coronary artery had ostial and proximal disease with 50-69% stenosis. An FFR analysis showed severe obstructive two-vessel coronary  disease in the ostial LAD and left circumflex as well as the OM1 branch. Cardiac catheterization on 05/25/2017 showed a 90% ostial LAD stenosis. There was 80% stenosis in the proximal to mid left circumflex. There was 90% stenosis in the first marginal branch. There is mild aortic stenosis with a mean gradient of 14 mmHg. He had an echocardiogram done on 05/25/2017 which showed normal left ventricular ejection fraction of 60-65%. There is grade 1 diastolic dysfunction. The aortic valve had mildly calcified leaflets with a mean gradient of 12 mmHg and a peak gradient of 22 mmHg and a dimensionless index of 0.42 consistent with mild aortic stenosis. The mitral valve had mildly thickened leaflets with a peak gradient of 4 mmHg and mild regurgitation.       Discharged Condition: good  Hospital Course: The patient was admitted electively and on 06/24/2017 taken to the operating room where he underwent the below described procedure.  He tolerated it well and was taken to the surgical intensive care unit in stable condition.  Postoperative hospital course: The patient is overall progressed nicely.  He was weaned from the ventilator using standard protocols without difficulty.  All routine lines, monitors and drainage devices have been discontinued in the standard time.  He does have a postoperative volume overload but is responding well to diuretics.He will continue a 7 day coarse of lasix at home.   Creatinine is 1.57 and trending slightly doenward but this is essentially his baseline..  We are monitoring closely.  Diabetes has been under adequate control using standard protocols with transition to home  medications.  Incisions are noted to be healing well without evidence of infection he is tolerating routine cardiac rehabilitation modalities.  Oxygen has been weaned and he maintains good saturations on room air.  He did have postoperative atrial fibrillation but has been chemically cardioverted to sinus rhythm  with amiodarone  and beta-blocker. BP is elevated and he is on home dose of norvasc as well. He may require further outpatient adjustments.  He does have an expected acute blood loss anemia but is not showing any evidence of ongoing bleeding.  His most recent hemoglobin and hemacrit are 9.8/29.7.  Overall at the time of discharge the patient is felt to be quite stable and progressing nicely in his recovery.   Consults: None  Significant Diagnostic Studies: routine post op labs and CXR's  Treatments: surgery:                                                                                      CARDIOVASCULAR SURGERY OPERATIVE NOTE  06/24/2017  Surgeon:  Gaye Pollack, MD  First Assistant: Jadene Pierini,  PA-C   Preoperative Diagnosis:  Severe two-vessel coronary artery disease   Postoperative Diagnosis:  Same   Procedure:  1. Median Sternotomy 2. Extracorporeal circulation 3.   Coronary artery bypass grafting x 3   Left internal mammary graft to the LAD  Sequential SVG to OM1 and OM3   4.   Endoscopic vein harvest from the right leg   Anesthesia:  General Endotracheal     Discharge Exam:  General appearance: alert, cooperative and no distress Heart: regular rate and rhythm Lungs: clear to auscultation bilaterally Abdomen: benign Extremities: minor edema Wound: incis healing well    Disposition: 01-Home or Self Care  Discharge Instructions    Discharge patient   Complete by:  As directed    Discharge disposition:  01-Home or Self Care   Discharge patient date:  06/29/2017     Allergies as of 06/29/2017      Reactions   Penicillins Other (See Comments)   Passed out ? SYNCOPE ?   Lisinopril Cough   Cortisone Nausea And Vomiting, Other (See Comments)   HICCUPS   Tramadol Nausea And Vomiting, Other (See Comments)   HICCUPS      Medication List    STOP taking these medications   isosorbide mononitrate 30 MG 24 hr tablet Commonly  known as:  IMDUR   nitroGLYCERIN 0.4 MG SL tablet Commonly known as:  NITROSTAT     TAKE these medications   amiodarone 400 MG tablet Commonly known as:  PACERONE Take 1 tablet (400 mg total) by mouth every 12 (twelve) hours. For 3 days, then 400 mg once daily   amLODipine 10 MG tablet Commonly known as:  NORVASC Take 1 tablet (10 mg total) by mouth daily.   aspirin 325 MG EC tablet Take 1 tablet (325 mg total) by mouth daily. What changed:    medication strength  how much to take   atorvastatin 80 MG tablet Commonly known as:  LIPITOR Take 1 tablet (80 mg total) by mouth daily at 6 PM.   colchicine 0.6 MG tablet Take 1-2 tablets (  0.6-1.2 mg total) by mouth daily as needed (for gout flare). 2 po at onset of flair and then repeat in 1 h for acute gout attack then 1 po qd until pain free   furosemide 40 MG tablet Commonly known as:  LASIX Take 1 tablet (40 mg total) by mouth daily.   glipiZIDE 10 MG tablet Commonly known as:  GLUCOTROL TAKE 1 TABLET BY MOUTH TWICE A DAY BEFORE MEALS.   metFORMIN 500 MG tablet Commonly known as:  GLUCOPHAGE TAKE 1 TABLET (500 MG TOTAL) BY MOUTH 2 (TWO) TIMES DAILY WITH A MEAL.   metoprolol tartrate 50 MG tablet Commonly known as:  LOPRESSOR Take 1 tablet (50 mg total) by mouth 2 (two) times daily. What changed:  how much to take   oxyCODONE 5 MG immediate release tablet Commonly known as:  Oxy IR/ROXICODONE Take 1-2 tablets (5-10 mg total) by mouth every 6 (six) hours as needed for severe pain.   potassium chloride SA 20 MEQ tablet Commonly known as:  K-DUR,KLOR-CON Take 1 tablet (20 mEq total) by mouth daily.      Follow-up Information    Wellington Hampshire, MD Follow up.   Specialty:  Cardiology Why:  see discharge paperwork for follow up appointment details Contact information: [place] [address] [phone]        Gaye Pollack, MD Follow up.   Specialty:  Cardiothoracic  Surgery Why:  see discharge paperwork for follow appointment details Contact information: [address] [place] [address] [phone]          The patient has been discharged on:   1.Beta Blocker:  Yes y[   ]                              No   [   ]                              If No, reason:  2.Ace Inhibitor/ARB: Yes [   ]                                     No  [   n ]                                     If No, reason:renal insuff  3.Statin:   Yes [ y  ]                  No  [   ]                  If No, reason:  4.Ecasa:  Yes  Blue.Reese   ]                  No   [   ]                  If No, reason:  Signed: Wilder Glade [place] 06/29/2017, 8:06 AM

## 2017-06-29 ENCOUNTER — Telehealth: Payer: Self-pay

## 2017-06-29 LAB — BASIC METABOLIC PANEL
Anion gap: 12 (ref 5–15)
BUN: 22 mg/dL — AB (ref 6–20)
CALCIUM: 8.7 mg/dL — AB (ref 8.9–10.3)
CHLORIDE: 103 mmol/L (ref 101–111)
CO2: 28 mmol/L (ref 22–32)
CREATININE: 1.57 mg/dL — AB (ref 0.61–1.24)
GFR calc non Af Amer: 43 mL/min — ABNORMAL LOW (ref >60)
GFR, EST AFRICAN AMERICAN: 50 mL/min — AB (ref >60)
Glucose, Bld: 154 mg/dL — ABNORMAL HIGH (ref 65–99)
Potassium: 3.5 mmol/L (ref 3.5–5.1)
SODIUM: 143 mmol/L (ref 135–145)

## 2017-06-29 LAB — GLUCOSE, CAPILLARY
GLUCOSE-CAPILLARY: 160 mg/dL — AB (ref 65–99)
GLUCOSE-CAPILLARY: 164 mg/dL — AB (ref 65–99)

## 2017-06-29 MED ORDER — ASPIRIN 325 MG PO TBEC: 325.0000 mg | DELAYED_RELEASE_TABLET | Freq: Every day | ORAL | 0 refills | Status: DC

## 2017-06-29 MED ORDER — FUROSEMIDE 40 MG PO TABS: 40.0000 mg | ORAL_TABLET | Freq: Every day | ORAL | 0 refills | Status: DC

## 2017-06-29 MED ORDER — AMIODARONE HCL 400 MG PO TABS: 400.0000 mg | ORAL_TABLET | Freq: Two times a day (BID) | ORAL | 1 refills | Status: DC

## 2017-06-29 MED ORDER — OXYCODONE HCL 5 MG PO TABS: 5.0000 mg | ORAL_TABLET | Freq: Four times a day (QID) | ORAL | 0 refills | Status: DC | PRN

## 2017-06-29 MED ORDER — AMLODIPINE BESYLATE 10 MG PO TABS: 10.0000 mg | ORAL_TABLET | Freq: Every day | ORAL | Status: DC

## 2017-06-29 MED ORDER — POTASSIUM CHLORIDE CRYS ER 20 MEQ PO TBCR: 20.0000 meq | EXTENDED_RELEASE_TABLET | Freq: Every day | ORAL | 0 refills | Status: DC

## 2017-06-29 NOTE — Progress Notes (Signed)
EPWs removed per protocol, tips intact.  Instructed on need for 1hr of bedrest with VS monitoring.  Will continue to monitor.

## 2017-06-29 NOTE — Progress Notes (Signed)
[id number] Education completed with pt,wife and family who voiced understanding. Discussed sternal precautions, IS, ex ed and carb counting and heart healthy diet. Discussed CRP 2 and will refer to [facility]. Pt would like rolling walker for home use. Notified RN. Graylon Good RN BSN 06/29/2017 11:41 AM

## 2017-06-29 NOTE — Care Management Note (Signed)
Case Management Note Previous note per:Zenon Mayo, RN 06/27/2017, 12:17 PM   Patient Details  Name: Antonio Wells MRN: [id number] Date of Birth: 12-Apr-1946  Subjective/Objective:  From home with wife who is there 24/7 and able to assist patient. He did not use any assistive devices at home pta.  He is POD 3 CABG with post op afib, on amio po, cont to diuresis and ambulate. He has PCP and medication coverage per patient.  They have a daughter who lives in [place] , a daughter who lives in [place] and one who lives in [place].                   Action/Plan: NCM will follow for dc needs.   Expected Discharge Date:  06/29/17               Expected Discharge Plan:  Home/Self Care  In-House Referral:     Discharge planning Services  CM Consult  Post Acute Care Choice:    Choice offered to:     DME Arranged:    DME Agency:     HH Arranged:    HH Agency:     Status of Service:  In process, will continue to follow  If discussed at Long Length of Stay Meetings, dates discussed:    Additional Comments: 06/29/2017 10:37 Pt for discharge home today.  No discharge needs noted. Kristen Cardinal, RN 06/29/2017, 10:26 AM

## 2017-06-29 NOTE — Progress Notes (Signed)
Discharge instructions, prescriptions and upcoming appts discussed with pt. Tele and IV removed. Pt verbalized understanding and has no further questions at this time.  Discharged home via personal vehicle accompanied by his wife and family.

## 2017-06-29 NOTE — Progress Notes (Signed)
[address]       [place] [address]             [phone]      5 Days Post-Op Procedure(s) (LRB): CORONARY ARTERY BYPASS GRAFTING (CABG) x 3 ; Using Left Internal Mammary Artery and Right Great Saphenous Leg Vein Harvested Endoscopically (N/A) TRANSESOPHAGEAL ECHOCARDIOGRAM (TEE) (N/A) Subjective: Feels pretty well  Objective: Vital signs in last 24 hours: Temp:  [98.6 F (37 C)-100.1 F (37.8 C)] 98.6 F (37 C) (12/05 0332) Pulse Rate:  [98] 98 (12/04 1331) Cardiac Rhythm: Normal sinus rhythm (12/05 0700) Resp:  [24] 24 (12/04 1331) BP: (149-176)/(73-80) 161/79 (12/05 0332) SpO2:  [97 %] 97 % (12/04 1331) Weight:  [191 lb 3.2 oz (86.7 kg)] 191 lb 3.2 oz (86.7 kg) (12/05 0332)  Hemodynamic parameters for last 24 hours:    Intake/Output from previous day: 12/04 0701 - 12/05 0700 In: -  Out: 1 [Stool:1] Intake/Output this shift: No intake/output data recorded.  General appearance: alert, cooperative and no distress Heart: regular rate and rhythm Lungs: clear to auscultation bilaterally Abdomen: benign Extremities: minor edema Wound: incis healing well  Lab Results: Recent Labs    06/27/17 0425  WBC 8.1  HGB 9.8*  HCT 29.7*  PLT 158   BMET:  Recent Labs    06/28/17 0240 06/29/17 0330  NA 142 143  K 3.7 3.5  CL 104 103  CO2 27 28  GLUCOSE 153* 154*  BUN 22* 22*  CREATININE 1.68* 1.57*  CALCIUM 8.5* 8.7*    PT/INR: No results for input(s): LABPROT, INR in the last 72 hours. ABG    Component Value Date/Time   PHART 7.361 06/24/2017 1919   HCO3 26.2 06/24/2017 1919   TCO2 24 06/25/2017 1717   O2SAT 94.0 06/24/2017 1919   CBG (last 3)  Recent Labs    06/28/17 1714 06/28/17 2039 06/29/17 0625  GLUCAP 116* 143* 160*    Meds Scheduled Meds: . amiodarone  400 mg Oral Q12H   Followed by  . [START ON 07/02/2017] amiodarone  400 mg Oral Daily  . amLODipine  5 mg Oral Daily  . aspirin EC  325 mg Oral Daily  .  atorvastatin  80 mg Oral q1800  . docusate sodium  200 mg Oral Daily  . enoxaparin (LOVENOX) injection  30 mg Subcutaneous QHS  . furosemide  40 mg Oral Daily  . insulin aspart  0-24 Units Subcutaneous TID AC & HS  . mouth rinse  15 mL Mouth Rinse BID  . metFORMIN  1,000 mg Oral BID WC  . metoprolol tartrate  50 mg Oral BID  . pantoprazole  40 mg Oral QAC breakfast  . potassium chloride  20 mEq Oral Daily  . sodium chloride flush  3 mL Intravenous Q12H   Continuous Infusions: . sodium chloride     PRN Meds:.sodium chloride, acetaminophen, bisacodyl **OR** bisacodyl, ondansetron **OR** ondansetron (ZOFRAN) IV, oxyCODONE, sodium chloride flush  Xrays No results found.  Assessment/Plan: S/P Procedure(s) (LRB): CORONARY ARTERY BYPASS GRAFTING (CABG) x 3 ; Using Left Internal Mammary Artery and Right Great Saphenous Leg Vein Harvested Endoscopically (N/A) TRANSESOPHAGEAL ECHOCARDIOGRAM (TEE) (N/A)   1 doing well, mostly sinus rhythm, rare sinus tach 2 BP elevated, will increase norvasc dose 3 creat improving, edema improving 4 d/c wires 5 sugars fair control, will d/c home on home DM meds- discussed low carb diet with patient 6 home today  LOS: 5 days  Antonio Wells 06/29/2017

## 2017-06-30 NOTE — Telephone Encounter (Signed)
Transition Care Management Follow-up Telephone Call   Date discharged? 06/29/17   How have you been since you were released from the hospital? Patient states that he still has some pain but is overall doing better   Do you understand why you were in the hospital? yes   Do you understand the discharge instructions? yes   Where were you discharged to? home   Items Reviewed:  Medications reviewed: yes  Allergies reviewed: yes  Dietary changes reviewed: yes  Referrals reviewed: yes   Functional Questionnaire:   Activities of Daily Living (ADLs):   He states they are independent in the following: ambulation, bathing and hygiene, feeding, continence, grooming, toileting and dressing States they require assistance with the following: none   Any transportation issues/concerns?: no   Any patient concerns? no   Confirmed importance and date/time of follow-up visits scheduled yes  Provider Appointment booked with Dr. Carlota Raspberry on 07/07/17 @ 10:20 am.   Confirmed with patient if condition begins to worsen call PCP or go to the ER.  Patient was given the office number and encouraged to call back with question or concerns.  : yes

## 2017-07-01 ENCOUNTER — Telehealth (HOSPITAL_COMMUNITY): Payer: Self-pay

## 2017-07-01 ENCOUNTER — Emergency Department (HOSPITAL_COMMUNITY)
Admission: EM | Admit: 2017-07-01 | Discharge: 2017-07-01 | Disposition: A | Discharge status: Home / Self Care | Payer: Medicare Other | Attending: Emergency Medicine | Admitting: Emergency Medicine

## 2017-07-01 ENCOUNTER — Other Ambulatory Visit: Payer: Self-pay

## 2017-07-01 ENCOUNTER — Ambulatory Visit: Payer: Self-pay | Admitting: *Deleted

## 2017-07-01 ENCOUNTER — Encounter (HOSPITAL_COMMUNITY): Payer: Self-pay

## 2017-07-01 DIAGNOSIS — Z7982 Long term (current) use of aspirin: Secondary | ICD-10-CM | POA: Diagnosis not present

## 2017-07-01 DIAGNOSIS — I25110 Atherosclerotic heart disease of native coronary artery with unstable angina pectoris: Secondary | ICD-10-CM | POA: Diagnosis not present

## 2017-07-01 DIAGNOSIS — I119 Hypertensive heart disease without heart failure: Secondary | ICD-10-CM | POA: Diagnosis not present

## 2017-07-01 DIAGNOSIS — Z87891 Personal history of nicotine dependence: Secondary | ICD-10-CM | POA: Insufficient documentation

## 2017-07-01 DIAGNOSIS — Z7984 Long term (current) use of oral hypoglycemic drugs: Secondary | ICD-10-CM | POA: Diagnosis not present

## 2017-07-01 DIAGNOSIS — R112 Nausea with vomiting, unspecified: Secondary | ICD-10-CM | POA: Insufficient documentation

## 2017-07-01 DIAGNOSIS — Z951 Presence of aortocoronary bypass graft: Secondary | ICD-10-CM | POA: Diagnosis not present

## 2017-07-01 DIAGNOSIS — E119 Type 2 diabetes mellitus without complications: Secondary | ICD-10-CM | POA: Diagnosis not present

## 2017-07-01 DIAGNOSIS — E860 Dehydration: Secondary | ICD-10-CM | POA: Insufficient documentation

## 2017-07-01 DIAGNOSIS — Z79899 Other long term (current) drug therapy: Secondary | ICD-10-CM | POA: Diagnosis not present

## 2017-07-01 LAB — URINALYSIS, ROUTINE W REFLEX MICROSCOPIC
BILIRUBIN URINE: NEGATIVE
Bacteria, UA: NONE SEEN
GLUCOSE, UA: NEGATIVE mg/dL
KETONES UR: NEGATIVE mg/dL
LEUKOCYTES UA: NEGATIVE
Nitrite: NEGATIVE
PH: 6 (ref 5.0–8.0)
PROTEIN: 30 mg/dL — AB
Specific Gravity, Urine: 1.019 (ref 1.005–1.030)
Squamous Epithelial / LPF: NONE SEEN

## 2017-07-01 LAB — COMPREHENSIVE METABOLIC PANEL
ALT: 24 U/L (ref 17–63)
ANION GAP: 10 (ref 5–15)
AST: 23 U/L (ref 15–41)
Albumin: 3.5 g/dL (ref 3.5–5.0)
Alkaline Phosphatase: 68 U/L (ref 38–126)
BUN: 20 mg/dL (ref 6–20)
CALCIUM: 9.1 mg/dL (ref 8.9–10.3)
CHLORIDE: 105 mmol/L (ref 101–111)
CO2: 29 mmol/L (ref 22–32)
CREATININE: 1.78 mg/dL — AB (ref 0.61–1.24)
GFR, EST AFRICAN AMERICAN: 43 mL/min — AB (ref >60)
GFR, EST NON AFRICAN AMERICAN: 37 mL/min — AB (ref >60)
Glucose, Bld: 189 mg/dL — ABNORMAL HIGH (ref 65–99)
Potassium: 3.9 mmol/L (ref 3.5–5.1)
SODIUM: 144 mmol/L (ref 135–145)
Total Bilirubin: 0.3 mg/dL (ref 0.3–1.2)
Total Protein: 6.8 g/dL (ref 6.5–8.1)

## 2017-07-01 LAB — CBC
HCT: 34.2 % — ABNORMAL LOW (ref 39.0–52.0)
HEMOGLOBIN: 11.1 g/dL — AB (ref 13.0–17.0)
MCH: 29.7 pg (ref 26.0–34.0)
MCHC: 32.5 g/dL (ref 30.0–36.0)
MCV: 91.4 fL (ref 78.0–100.0)
PLATELETS: 309 10*3/uL (ref 150–400)
RBC: 3.74 MIL/uL — AB (ref 4.22–5.81)
RDW: 13.5 % (ref 11.5–15.5)
WBC: 6 10*3/uL (ref 4.0–10.5)

## 2017-07-01 LAB — LIPASE, BLOOD: LIPASE: 25 U/L (ref 11–51)

## 2017-07-01 MED ORDER — LACTATED RINGERS IV BOLUS (SEPSIS)
1000.0000 mL | Freq: Once | INTRAVENOUS | Status: AC
  Administered 2017-07-01: 1000 mL via INTRAVENOUS

## 2017-07-01 MED ORDER — ONDANSETRON HCL 4 MG PO TABS: 4.0000 mg | ORAL_TABLET | Freq: Four times a day (QID) | ORAL | 0 refills | Status: DC

## 2017-07-01 MED ORDER — ONDANSETRON 4 MG PO TBDP
4.0000 mg | ORAL_TABLET | Freq: Once | ORAL | Status: AC
  Administered 2017-07-01: 4 mg via ORAL
  Filled 2017-07-01: qty 1

## 2017-07-01 NOTE — Telephone Encounter (Signed)
Pt  Had  Bypass   Surgery  1   Week  Ago   He  Was   Discharged  2  Days  Ago  .  He  Reports  Was  Given  Nausea  Med  And  Has  Been taking it . He  Reports  He  Has  Been  Vomiting  And  Not able  To  Hold  Liquids down. He  Sates  He  Feels  Weak.   He  Denies  Any heavyness  In  Chest but  Reports  He  Has   Some  Soreness  That he  Had  In  Hospital .  Pt advised  That  He needed  To go  To  Er   For  evaul   Clinical  Staff  At  [facility]  Notified and  Agree. He states  His  Wife  Will take  Him  Now . Reason for Disposition . [1] Drinking very little AND [2] dehydration suspected (e.g., no urine > 12 hours, very dry mouth, very lightheaded)  Answer Assessment - Initial Assessment Questions 1. VOMITING SEVERITY: "How many times have you vomited in the past 24 hours?"     - MILD:  1 - 2 times/day    - MODERATE: 3 - 5 times/day, decreased oral intake without significant weight loss or symptoms of dehydration    - SEVERE: 6 or more times/day, vomits everything or nearly everything, with significant weight loss, symptoms of dehydration      Vomited   X   1     Borderline   2. ONSET: "When did the vomiting begin?"      3  Days   3. FLUIDS: "What fluids or food have you vomited up today?" "Have you been able to keep any fluids down?"       Vomited   Liquids   4. ABDOMINAL PAIN: "Are your having any abdominal pain?" If yes : "How bad is it and what does it feel like?" (e.g., crampy, dull, intermittent, constant)       No 5. DIARRHEA: "Is there any diarrhea?" If so, ask: "How many times today?"       no 6. CONTACTS: "Is there anyone else in the family with the same symptoms?"       no 7. CAUSE: "What do you think is causing your vomiting?"      Do  Not  now  8. HYDRATION STATUS: "Any signs of dehydration?" (e.g., dry mouth [not only dry lips], too weak to stand) "When did you last urinate?"      Mouth is   Dry    Voided   approx  15  mins   9. OTHER SYMPTOMS: "Do you have any other symptoms?"  (e.g., fever, headache, vertigo, vomiting blood or coffee grounds, recent head injury)       Weakness    10. PREGNANCY: "Is there any chance you are pregnant?" "When was your last menstrual period?"       n/a  Protocols used: [facility]

## 2017-07-01 NOTE — ED Provider Notes (Signed)
I saw and evaluated the patient, reviewed the resident's note and I agree with the findings and plan.   EKG Interpretation  Date/Time:  Friday July 01 2017 12:11:44 EST Ventricular Rate:  74 PR Interval:  146 QRS Duration: 84 QT Interval:  402 QTC Calculation: 446 R Axis:   26 Text Interpretation:  Normal sinus rhythm Possible Left atrial enlargement Nonspecific T wave abnormality Abnormal ECG Confirmed by Lacretia Leigh ([id number]) on 07/01/2017 4:29:90 PM     71 year old male presents with trouble swallowing times several days.  Has had emesis x2.  Denies abdominal pain or fever.  Did recently have open heart surgery.  On exam he said his abdomen is benign.  Labs are reassuring.  Will IV hydrate and given Zofran and likely discharge   Lacretia Leigh, MD 07/01/17 [id number]

## 2017-07-01 NOTE — ED Triage Notes (Signed)
Per Pt, Pt is coming from home with complaints of nausea and vomiting secondary to heart surgery on last Friday. Pt hasn't been able to keep fluids down since he was discharged.

## 2017-07-01 NOTE — Discharge Instructions (Signed)
Please return for fevers, persistent nausea vomiting, chest pain, shortness of breath, abdominal pain or any other concerning symptoms.  Please follow-up with your PCP at the already scheduled appointment.  Please call gastroenterology to schedule an appointment to evaluate your esophagus for possible stricture

## 2017-07-01 NOTE — ED Provider Notes (Signed)
[facility] EMERGENCY DEPARTMENT Provider Note   CSN: [id number] Arrival date & time: 07/01/17  1207     History   Chief Complaint Chief Complaint  Patient presents with  . Emesis    HPI Antonio Wells is a 71 y.o. male.  HPI 77yoM with hx of DM, HLD, HTN, CAD s/p 3v CABG performed on 06/24/17 and discharged on Wednesday presenting with inability to tolerate p.o. since discharge.  He states that he had mild nausea the night before he was discharged but was given Zofran which made it resolve.  He states that since his discharge he has had a few episodes of emesis but is unable to drink or eat much due to severe nausea.  Denies abdominal pain or distention.  Has had a few bowel movements.  Denies chest pain, shortness of breath, lightheadedness or dizziness.  Denies any symptoms on exertion.  Denies any fevers, chills, cough or sputum production.  He otherwise has no complaints other than his nausea and inability to tolerate p.o.  No alleviating factors.  Sodas and food make his nausea worse but he is also having difficulty tolerating water.  Has not tried anything for nausea.  Past Medical History:  Diagnosis Date  . Arthritis   . Coronary artery disease   . Diabetes mellitus without complication ([place])   . Gout   . Heart murmur   . High cholesterol   . Hypertension   . Sleep apnea     Patient Active Problem List   Diagnosis Date Noted  . 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 CAD (coronary artery disease) 05/25/2017  . Unstable angina ([place])   . Hypertensive heart disease 07/14/2015  . Lower extremity edema 07/14/2015  . Aortic stenosis 01/09/2015  . OSA on CPAP 12/12/2014  . Hypersomnia with sleep apnea 12/12/2014  . Obstructive sleep apnea (adult) (pediatric) 02/04/2014  . 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 Obesity 02/04/2014  . Mitral regurgitation and aortic stenosis 01/02/2014  . DOE (dyspnea on exertion) 11/26/2013  . Gout 08/12/2011  . Hypertension  08/12/2011  . High cholesterol 08/12/2011  . OSA (obstructive sleep apnea) 08/12/2011  . Diabetes type 2, uncontrolled ([place]) 08/12/2011    Past Surgical History:  Procedure Laterality Date  . COLONOSCOPY  2008  . CORONARY ARTERY BYPASS GRAFT N/A 06/24/2017   Procedure: CORONARY ARTERY BYPASS GRAFTING (CABG) x 3 ; Using Left Internal Mammary Artery and Right Great Saphenous Leg Vein Harvested Endoscopically;  Surgeon: Gaye Pollack, MD;  Location: [facility] OR;  Service: Open Heart Surgery;  Laterality: N/A;  . LEFT HEART CATH AND CORONARY ANGIOGRAPHY N/A 05/25/2017   Procedure: LEFT HEART CATH AND CORONARY ANGIOGRAPHY;  Surgeon: Wellington Hampshire, MD;  Location: [facility];  Service: Cardiovascular;  Laterality: N/A;  . TEE WITHOUT CARDIOVERSION N/A 06/24/2017   Procedure: TRANSESOPHAGEAL ECHOCARDIOGRAM (TEE);  Surgeon: Gaye Pollack, MD;  Location: [facility];  Service: Open Heart Surgery;  Laterality: N/A;       Home Medications    Prior to Admission medications   Medication Sig Start Date End Date Taking? Authorizing Provider  amiodarone (PACERONE) 400 MG tablet Take 1 tablet (400 mg total) by mouth every 12 (twelve) hours. For 3 days, then 400 mg once daily 06/29/17  Yes Gold, Wayne E, PA-C  amLODipine (NORVASC) 10 MG tablet Take 1 tablet (10 mg total) by mouth daily. 05/12/17  Yes [name], MD  aspirin EC 325 MG EC tablet Take 1  tablet (325 mg total) by mouth daily. 06/29/17  Yes Gold, Wayne E, PA-C  atorvastatin (LIPITOR) 80 MG tablet Take 1 tablet (80 mg total) by mouth daily at 6 PM. Patient taking differently: Take 80 mg by mouth every morning.  05/26/17  Yes Bhagat, Bhavinkumar, PA  furosemide (LASIX) 40 MG tablet Take 1 tablet (40 mg total) by mouth daily. 06/29/17  Yes Gold, Wayne E, PA-C  glipiZIDE (GLUCOTROL) 10 MG tablet TAKE 1 TABLET BY MOUTH TWICE A DAY BEFORE MEALS. Patient taking differently: Take 10 mg by mouth 2 (two) times daily before a meal.  05/12/17  Yes  [name], MD  metFORMIN (GLUCOPHAGE) 500 MG tablet TAKE 1 TABLET (500 MG TOTAL) BY MOUTH 2 (TWO) TIMES DAILY WITH A MEAL. Patient taking differently: Take 500 mg by mouth 2 (two) times daily with a meal.  05/12/17  Yes [name], MD  metoprolol tartrate (LOPRESSOR) 50 MG tablet Take 1 tablet (50 mg total) by mouth 2 (two) times daily. 05/12/17  Yes [name], MD  oxyCODONE (OXY IR/ROXICODONE) 5 MG immediate release tablet Take 1-2 tablets (5-10 mg total) by mouth every 6 (six) hours as needed for severe pain. 06/29/17  Yes Gold, Wayne E, PA-C  potassium chloride SA (K-DUR,KLOR-CON) 20 MEQ tablet Take 1 tablet (20 mEq total) by mouth daily. 06/29/17  Yes Gold, Patrick Jupiter E, PA-C  colchicine 0.6 MG tablet Take 1-2 tablets (0.6-1.2 mg total) by mouth daily as needed (for gout flare). 2 po at onset of flair and then repeat in 1 h for acute gout attack then 1 po qd until pain free 05/31/13   Harvie Heck, PA-C    Family History Family History  Problem Relation Age of Onset  . Hypertension Mother   . Kidney disease Mother   . Diabetes Mother   . Hypertension Father   . Cancer Brother        lung  . Stroke Brother   . Colon cancer Neg Hx     Social History Social History   Tobacco Use  . Smoking status: Former Smoker    Packs/day: 2.00    Years: 15.00    Pack years: 30.00    Types: Cigarettes    Last attempt to quit: 07/26/1998    Years since quitting: 18.9  . Smokeless tobacco: Never Used  Substance Use Topics  . Alcohol use: No    Alcohol/week: 0.0 oz  . Drug use: No     Allergies   Penicillins; Lisinopril; Oxycodone; Cortisone; and Tramadol   Review of Systems Review of Systems  Constitutional: Negative for chills and fever.  HENT: Negative for ear pain and sore throat.   Eyes: Negative for pain and visual disturbance.  Respiratory: Negative for cough and shortness of breath.   Cardiovascular: Negative for chest pain and palpitations.    Gastrointestinal: Positive for nausea and vomiting. Negative for abdominal pain.  Genitourinary: Negative for dysuria and hematuria.  Musculoskeletal: Negative for arthralgias and back pain.  Skin: Negative for color change and rash.  Neurological: Negative for seizures and syncope.  All other systems reviewed and are negative.    Physical Exam Updated Vital Signs BP (!) 152/68   Pulse 65   Temp 98.4 F (36.9 C) (Oral)   Resp 19   Ht 5\' 3"  (1.6 m)   Wt 89.8 kg (198 lb)   SpO2 100%   BMI 35.07 kg/m   Physical Exam  Constitutional: He appears well-developed and well-nourished.  HENT:  Head: Normocephalic and atraumatic.  Eyes: Conjunctivae are normal. Pupils are equal, round, and reactive to light.  Neck: Normal range of motion. Neck supple.  Cardiovascular: Normal rate, regular rhythm, normal heart sounds, intact distal pulses and normal pulses.  No murmur heard. Pulmonary/Chest: Effort normal and breath sounds normal. No accessory muscle usage or stridor. No tachypnea. No respiratory distress. He has no decreased breath sounds.  Midline sternotomy incision CDI with no surrounding erythema or drainage.  Abdominal: Soft. He exhibits no distension. There is no tenderness. There is no guarding, no CVA tenderness, no tenderness at McBurney's point and negative Murphy's sign.  Musculoskeletal: He exhibits no edema.  Neurological: He is alert.  Skin: Skin is warm and dry.  Psychiatric: He has a normal mood and affect.  Nursing note and vitals reviewed.    ED Treatments / Results  Labs (all labs ordered are listed, but only abnormal results are displayed) Labs Reviewed  COMPREHENSIVE METABOLIC PANEL - Abnormal; Notable for the following components:      Result Value   Glucose, Bld 189 (*)    Creatinine, Ser 1.78 (*)    GFR calc non Af Amer 37 (*)    GFR calc Af Amer 43 (*)    All other components within normal limits  CBC - Abnormal; Notable for the following components:    RBC 3.74 (*)    Hemoglobin 11.1 (*)    HCT 34.2 (*)    All other components within normal limits  URINALYSIS, ROUTINE W REFLEX MICROSCOPIC - Abnormal; Notable for the following components:   Hgb urine dipstick SMALL (*)    Protein, ur 30 (*)    All other components within normal limits  LIPASE, BLOOD    EKG  EKG Interpretation  Date/Time:  Friday July 01 2017 12:11:44 EST Ventricular Rate:  74 PR Interval:  146 QRS Duration: 84 QT Interval:  402 QTC Calculation: 446 R Axis:   26 Text Interpretation:  Normal sinus rhythm Possible Left atrial enlargement Nonspecific T wave abnormality Abnormal ECG Confirmed by Lacretia Leigh ([id number]) on 07/01/2017 4:29:31 PM       Radiology No results found.  Procedures Procedures (including critical care time)  Medications Ordered in ED Medications  ondansetron (ZOFRAN-ODT) disintegrating tablet 4 mg (4 mg Oral Given 07/01/17 1613)     Initial Impression / Assessment and Plan / ED Course  I have reviewed the triage vital signs and the nursing notes.  Pertinent labs & imaging results that were available during my care of the patient were reviewed by me and considered in my medical decision making (see chart for details).     71 year old male status post CABG 1 week ago who is had nausea and difficulty tolerating p.o. since Wednesday night.  Denies chest pain, shortness of breath, lightheadedness, dizziness.  Doubt ACS or complication from CABG.  Has not had any fevers or chills.  Denies abdominal pain or distention.  Has been having bowel movements without difficulty.  EKG with nonspecific T wave changes in lateral leads but no obvious ischemic changes.  Labs drawn in triage with a mildly elevated creatinine of 1.7 up from his baseline of approximately 1.5.  No leukocytosis.  Hemoglobin elevated from prior admission at 11.  UA without signs of infection and lipase normal.  Exam shows a benign abdomen with no distention or tenderness  palpation.  No right upper quadrant pain and negative Murphy's, doubt cholecystitis.  No right lower quadrant pain or pain Burney's  point, doubt appendicitis.  No distention and patient is having bowel movements therefore low suspicion for postop obstruction.  We will give the patient a fluid bolus and Zofran and p.o. challenge.  If patient is able to tolerate p.o. believe that he be able to be discharged with PCP follow-up as I see no signs of acute infection or surgical complication.   Patient tolerated p.o. without difficulty.  Liter fluid given.  Patient states that he feels much better and would like to go home.  We will have the patient follow-up with his PCP at the already scheduled appointment next week.  Patient may also have a possible esophageal stricture as he feels like the food does not get down easily.  Follow-up information given for gastroenterology.  Will send home with Zofran strict return precautions given.  Final Clinical Impressions(s) / ED Diagnoses   Final diagnoses:  Non-intractable vomiting with nausea, unspecified vomiting type  Dehydration    ED Discharge Orders    None       Jomaira Darr Mali, MD 07/01/17 2019

## 2017-07-01 NOTE — Telephone Encounter (Signed)
Patient's insurance is active and benefits verified through Medicare Part A & B - No co-pay, deductible amount of $183.00/$183.00 has been met, no out of pocket, 20% co-insurance, and no pre-authorization is required. Passport/reference [id number]  Patient's insurance is active and benefits verified through [employer] - No co-pay, no deductible, no out of pocket, no co-insurance, and no pre-authorization is required. Passport/reference [id number]  Patient will be contacted and scheduled after their follow up appt with the Cardiologist office upon review by the RN Navigator.

## 2017-07-04 ENCOUNTER — Ambulatory Visit: Payer: Medicare Other | Admitting: Physician Assistant

## 2017-07-07 ENCOUNTER — Other Ambulatory Visit: Payer: Self-pay | Admitting: Family Medicine

## 2017-07-07 ENCOUNTER — Other Ambulatory Visit: Payer: Self-pay

## 2017-07-07 ENCOUNTER — Telehealth: Payer: Self-pay | Admitting: Physician Assistant

## 2017-07-07 ENCOUNTER — Ambulatory Visit (INDEPENDENT_AMBULATORY_CARE_PROVIDER_SITE_OTHER): Payer: Medicare Other | Admitting: Family Medicine

## 2017-07-07 ENCOUNTER — Encounter: Payer: Self-pay | Admitting: Family Medicine

## 2017-07-07 ENCOUNTER — Telehealth: Payer: Self-pay | Admitting: Family Medicine

## 2017-07-07 ENCOUNTER — Telehealth: Payer: Self-pay

## 2017-07-07 ENCOUNTER — Ambulatory Visit (INDEPENDENT_AMBULATORY_CARE_PROVIDER_SITE_OTHER): Payer: Medicare Other

## 2017-07-07 VITALS — BP 132/74 | HR 85 | Temp 98.2°F | Resp 18 | Ht 63.0 in | Wt 179.8 lb

## 2017-07-07 DIAGNOSIS — K143 Hypertrophy of tongue papillae: Secondary | ICD-10-CM | POA: Diagnosis not present

## 2017-07-07 DIAGNOSIS — R634 Abnormal weight loss: Secondary | ICD-10-CM

## 2017-07-07 DIAGNOSIS — E11319 Type 2 diabetes mellitus with unspecified diabetic retinopathy without macular edema: Secondary | ICD-10-CM

## 2017-07-07 DIAGNOSIS — N189 Chronic kidney disease, unspecified: Secondary | ICD-10-CM

## 2017-07-07 DIAGNOSIS — I2510 Atherosclerotic heart disease of native coronary artery without angina pectoris: Secondary | ICD-10-CM

## 2017-07-07 DIAGNOSIS — IMO0002 Reserved for concepts with insufficient information to code with codable children: Secondary | ICD-10-CM

## 2017-07-07 DIAGNOSIS — R112 Nausea with vomiting, unspecified: Secondary | ICD-10-CM

## 2017-07-07 DIAGNOSIS — E1165 Type 2 diabetes mellitus with hyperglycemia: Secondary | ICD-10-CM | POA: Diagnosis not present

## 2017-07-07 DIAGNOSIS — D649 Anemia, unspecified: Secondary | ICD-10-CM | POA: Diagnosis not present

## 2017-07-07 DIAGNOSIS — R5383 Other fatigue: Secondary | ICD-10-CM

## 2017-07-07 LAB — POCT CBC
GRANULOCYTE PERCENT: 74.3 % (ref 37–80)
HEMATOCRIT: 37.6 % — AB (ref 43.5–53.7)
HEMOGLOBIN: 12.6 g/dL — AB (ref 14.1–18.1)
Lymph, poc: 1.4 (ref 0.6–3.4)
MCH: 30.5 pg (ref 27–31.2)
MCHC: 33.6 g/dL (ref 31.8–35.4)
MCV: 90.8 fL (ref 80–97)
MID (CBC): 0.2 (ref 0–0.9)
MPV: 7.7 fL (ref 0–99.8)
PLATELET COUNT, POC: 482 10*3/uL — AB (ref 142–424)
POC Granulocyte: 4.7 (ref 2–6.9)
POC LYMPH PERCENT: 22.4 %L (ref 10–50)
POC MID %: 3.3 %M (ref 0–12)
RBC: 4.14 M/uL — AB (ref 4.69–6.13)
RDW, POC: 14.1 %
WBC: 6.3 10*3/uL (ref 4.6–10.2)

## 2017-07-07 LAB — POCT SKIN KOH: SKIN KOH, POC: NEGATIVE

## 2017-07-07 LAB — GLUCOSE, POCT (MANUAL RESULT ENTRY): POC GLUCOSE: 176 mg/dL — AB (ref 70–99)

## 2017-07-07 LAB — BASIC METABOLIC PANEL
BUN / CREAT RATIO: 13 (ref 10–24)
BUN: 25 mg/dL (ref 8–27)
CHLORIDE: 103 mmol/L (ref 96–106)
CO2: 29 mmol/L (ref 20–29)
Calcium: 9.5 mg/dL (ref 8.6–10.2)
Creatinine, Ser: 1.87 mg/dL — ABNORMAL HIGH (ref 0.76–1.27)
GFR calc Af Amer: 41 mL/min/{1.73_m2} — ABNORMAL LOW (ref >59)
GFR calc non Af Amer: 36 mL/min/{1.73_m2} — ABNORMAL LOW (ref >59)
GLUCOSE: 173 mg/dL — AB (ref 65–99)
POTASSIUM: 4.9 mmol/L (ref 3.5–5.2)
SODIUM: 145 mmol/L — AB (ref 134–144)

## 2017-07-07 NOTE — Telephone Encounter (Signed)
Contacted by PCP Dr. Carlota Raspberry regarding concern for patient's nausea/vomiting, happening daily since discharge from hospital and coinciding with amiodarone initiation. Dr. Carlota Raspberry reports patient is clinically in NSR today. GI side effects are present in up to 30% of patients during loading doses. I reviewed with Dr. Meda Coffee. Initially we considered dropping dose down to 200mg , but she feels comfortable stopping it completely for now. Additionally, she would recommend holding off Zofran until he's been off the amiodarone for a few days (use sparingly given potential QT prolongation. He has no h/o QT prolongation; precaution only). Given long half life he may continue to have n/v for a few days but hopeful he will be improved by time of planned f/u next week. Relayed recs to Dr. Carlota Raspberry who will be contacting patient today to discuss along with results of BMET.  Dayna Dunn PA-C

## 2017-07-07 NOTE — Telephone Encounter (Signed)
Patient saw Dr Nyoka Cowden (PCP) today for follow up after surgery. He was C/O nausea and vomiting since hospital discharge. All of his labs were ok, Dr Nyoka Cowden did an Abdominal series that was normal-No bowel obstruction. He is having BM's daily, He has no fevers. His incision sites look good. He has lost over 10 lbs since surgery. He is currently taking Amiodarone 400 mg once a day, and Oxycodone 5 mg. I am thinking it might be one of these medications. Mr Scurlock also went to ED over weekend due to N/V and was given IV Zofran. Dr Nyoka Cowden is going to contact Cardiologist to discuss medications. He is scheduled to see you on 07/27/2017. Please Advise SW     Dr. Cyndia Bent recommended to stop the Amiodarone.  Mr Osley was notified to stop.

## 2017-07-07 NOTE — Progress Notes (Signed)
Wt Readings from Last 3 Encounters:  07/07/17 179 lb 12.8 oz (81.6 kg)  07/01/17 198 lb (89.8 kg)  06/29/17 191 lb 3.2 oz (86.7 kg)

## 2017-07-07 NOTE — Progress Notes (Addendum)
Subjective:  By signing my name below, I, Antonio Wells, attest that this documentation has been prepared under the direction and in the presence of Antonio Ray, MD. Electronically Signed: [name], [place]. 07/07/2017 , 11:21 AM .  Patient was seen in Room 10 .   Patient ID: Antonio Wells, male    DOB: 06-25-1946, 71 y.o.   MRN: [id number] Chief Complaint  Patient presents with  . Follow-up    had open heart surgery   . Emesis    cant keep any food or liquids down started since last wednesday and pt states he doesnt have any feeling in taste buds    HPI Antonio Wells is a 71 y.o. male Here for hospital follow up. He has a history of multiple medical problems including DM, gout, HTN, and OSA on cpap. His wife, Vaughan Wells, came in with him today.   CABG x3 He had increasing exertional shortness of breath. Cardiac cath on Oct 31st 90% LAD stenosis, 80% circumflex, 90% first marginal branch. He had CABG x3 on Nov 30th, done by Dr. Cyndia Wells. Post-op afib, treated with amiodarone and beta blocker, continued on aspirin 365m QD. Post-op anemia, hemoglobin of 9.8 on Dec 3rd, improved at repeating testing 6 days ago at 11.1. He was discharged on Dec 5th. He has appointment with cardiology in 5 days and with Dr. BCyndia Wells Jan 2nd.   He's been taking his medications, except for potassium. He wasn't able to stomach the pill- last dose yesterday.   Nausea + vomiting He was seen in the ER 6 days ago for nausea and vomiting. He had emesis x2 with trouble swallowing for multiple days. He apparently had mild nausea prior to discharge in the ER prior, treated with Zofran. He had persistent nausea, impacting ability to eat or drink without abdominal pain or distension at ER visit.   Patient states he's felt nauseated since he came out of surgery for about the past 2 weeks. He vomited twice in recent ER visit. He informs usually vomiting 2 hours after taking his evening medications. He denies  vomiting during the day, or during day time medication. He notes vomiting once a day and his nausea feels worse now than before. He feels fatigued and weakened, since he hasn't been able to keep food down for the past week. He's been able to urinate x4-6 a day, light and dark colored urine. He's not taking Zofran right now because his pharmacist informs him that Zofran interacts with another medication (amiodarone). He denies any abdominal swelling/distension.   He mentions having a "pasty taste" in his mouth. During the day, he drinks glucerna once a day. He's tried drinking chicken noodle soup, but it'll come back up after taking his evening medications. He's been drinking some tea and trying to sip water throughout the day. He's also drinking Gatorade and orange juice with his medication. He denies drinking any sodas. He's also eating popsicles. He denies difficulty swallowing liquids, but feels like there's a limit and can't drink more. Initially, he had trouble swallowing as his throat felt sore and couldn't really swallow. He denies any pain in his mouth. He denies slurred speech or weakness. His wife read that there may be taste issues after open heart surgery.   Elevated creatinine His creatinine was up to 1.78 in recent ER visit, from baseline of 1.5. He had a benign abdominal exam. He denies abdominal distension. He's able to have bowel movements. He's been given fluid bolus, and  Zofran. He was able to tolerate PO in the ER and improved. He had creatinine range of 1.33 to 1.99 on Oct 30th.   Lab Results  Component Value Date   CREATININE 1.78 (H) 07/01/2017   CREATININE 1.57 (H) 06/29/2017   CREATININE 1.30 (H) 06/24/2017   CREATININE 1.99 (H) 05/24/2017    Diabetes He hasn't been checking his sugar at home recently. He's still taking glipizide 57m BID and metformin 5048mBID.   Lab Results  Component Value Date   HGBA1C 8.2 (H) 06/20/2017   He had some elevation recently. Planned on  Oct 18th to start Invokana, but patient did not start due to cost prohibitive; no change in medications.   Patient Active Problem List   Diagnosis Date Noted  . 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 CAD (coronary artery disease) 05/25/2017  . Unstable angina ([facility] [place]  . Hypertensive heart disease 07/14/2015  . Lower extremity edema 07/14/2015  . Aortic stenosis 01/09/2015  . OSA on CPAP 12/12/2014  . Hypersomnia with sleep apnea 12/12/2014  . Obstructive sleep apnea (adult) (pediatric) 02/04/2014  . 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 Obesity 02/04/2014  . Mitral regurgitation and aortic stenosis 01/02/2014  . DOE (dyspnea on exertion) 11/26/2013  . Gout 08/12/2011  . Hypertension 08/12/2011  . High cholesterol 08/12/2011  . OSA (obstructive sleep apnea) 08/12/2011  . Diabetes type 2, uncontrolled ([facility] [place]01/17/2013   Past Medical History:  Diagnosis Date  . Arthritis   . Coronary artery disease   . Diabetes mellitus without complication ([facility]  . Gout   . Heart murmur   . High cholesterol   . Hypertension   . Sleep apnea    Past Surgical History:  Procedure Laterality Date  . COLONOSCOPY  2008  . CORONARY ARTERY BYPASS GRAFT N/A 06/24/2017   Procedure: CORONARY ARTERY BYPASS GRAFTING (CABG) x 3 ; Using Left Internal Mammary Artery and Right Great Saphenous Leg Vein Harvested Endoscopically;  Surgeon: BaGaye PollackMD;  [facility];  Service: Open Heart Surgery;  Laterality: N/A;  . LEFT HEART CATH AND CORONARY ANGIOGRAPHY N/A 05/25/2017   Procedure: LEFT HEART CATH AND CORONARY ANGIOGRAPHY;  Surgeon: ArWellington HampshireMD;  [facility];  Service: Cardiovascular;  Laterality: N/A;  . TEE WITHOUT CARDIOVERSION N/A 06/24/2017   Procedure: TRANSESOPHAGEAL ECHOCARDIOGRAM (TEE);  Surgeon: BaGaye PollackMD;  [facility] Service: Open [facility] Surgery;  Laterality: N/A;   Allergies  Allergen Reactions  . Penicillins Other (See Comments)    Has patient had a  PCN reaction causing immediate rash, facial/tongue/throat swelling, SOB or lightheadedness with hypotension: No Has patient had a PCN reaction causing severe rash involving mucus membranes or skin necrosis: No Has patient had a PCN reaction that required hospitalization: No Has patient had a PCN reaction occurring within the last 10 years: No If all of the above answers are "NO", then may proceed with Cephalosporin use.   Passed out ? SYNCOPE ?  . Marland Kitchenisinopril Cough  . Oxycodone Other (See Comments)    hallucinations  . Zofran [Ondansetron Hcl]     Has an interaction with another medication pt takes   . Cortisone Nausea And Vomiting and Other (See Comments)    HICCUPS  . Tramadol Nausea And Vomiting and Other (See Comments)    HICCUPS   Prior to Admission medications   Medication Sig Start Date End Date Taking? Authorizing Provider  amiodarone (PACERONE) 400 MG tablet Take 1  tablet (400 mg total) by mouth every 12 (twelve) hours. For 3 days, then 400 mg once daily 06/29/17  Yes Gold, Wayne E, PA-C  amLODipine (NORVASC) 10 MG tablet Take 1 tablet (10 mg total) by mouth daily. 05/12/17  Yes [name], MD  aspirin EC 325 MG EC tablet Take 1 tablet (325 mg total) by mouth daily. 06/29/17  Yes Gold, Wayne E, PA-C  atorvastatin (LIPITOR) 80 MG tablet Take 1 tablet (80 mg total) by mouth daily at 6 PM. Patient taking differently: Take 80 mg by mouth every morning.  05/26/17  Yes Bhagat, Bhavinkumar, PA  colchicine 0.6 MG tablet Take 1-2 tablets (0.6-1.2 mg total) by mouth daily as needed (for gout flare). 2 po at onset of flair and then repeat in 1 h for acute gout attack then 1 po qd until pain free 05/31/13  Yes Harvie Heck, PA-C  furosemide (LASIX) 40 MG tablet Take 1 tablet (40 mg total) by mouth daily. 06/29/17  Yes Gold, Wayne E, PA-C  glipiZIDE (GLUCOTROL) 10 MG tablet TAKE 1 TABLET BY MOUTH TWICE A DAY BEFORE MEALS. Patient taking differently: Take 10 mg by mouth 2 (two) times daily  before a meal.  05/12/17  Yes [name], MD  metFORMIN (GLUCOPHAGE) 500 MG tablet TAKE 1 TABLET (500 MG TOTAL) BY MOUTH 2 (TWO) TIMES DAILY WITH A MEAL. Patient taking differently: Take 500 mg by mouth 2 (two) times daily with a meal.  05/12/17  Yes [name], MD  metoprolol tartrate (LOPRESSOR) 50 MG tablet Take 1 tablet (50 mg total) by mouth 2 (two) times daily. 05/12/17  Yes [name], MD  oxyCODONE (OXY IR/ROXICODONE) 5 MG immediate release tablet Take 1-2 tablets (5-10 mg total) by mouth every 6 (six) hours as needed for severe pain. 06/29/17  Yes Gold, Wayne E, PA-C  potassium chloride SA (K-DUR,KLOR-CON) 20 MEQ tablet Take 1 tablet (20 mEq total) by mouth daily. 06/29/17  Yes Gold, Wayne E, PA-C  ondansetron (ZOFRAN) 4 MG tablet Take 1 tablet (4 mg total) by mouth every 6 (six) hours. Patient not taking: Reported on 07/07/2017 07/01/17   Page, Nathan Mali, MD   Social History   Socioeconomic History  . Marital status: Married    Spouse name: Vaughan Wells  . Number of children: 3  . Years of education: 47  . Highest education level: Not on file  Social Needs  . Financial resource strain: Not on file  . Food insecurity - worry: Not on file  . Food insecurity - inability: Not on file  . Transportation needs - medical: Not on file  . Transportation needs - non-medical: Not on file  Occupational History  . Occupation: Musician  Tobacco Use  . Smoking status: Former Smoker    Packs/day: 2.00    Years: 15.00    Pack years: 30.00    Types: Cigarettes    Last attempt to quit: 07/26/1998    Years since quitting: 18.9  . Smokeless tobacco: Never Used  Substance and Sexual Activity  . Alcohol use: No    Alcohol/week: 0.0 oz  . Drug use: No  . Sexual activity: Yes  Other Topics Concern  . Not on file  Social History Narrative   Patient is married Vaughan Wells) and lives at home with his wife.   Patient has three adult children.   Patient is retired.   Patient is a  Research officer, political party.   Patient is right-handed.   Patient drinks one cup  of coffee daily and 2-3 sodas per day.      Review of Systems  Constitutional: Positive for appetite change and fatigue. Negative for chills and fever.  HENT: Positive for trouble swallowing.   Gastrointestinal: Positive for nausea and vomiting. Negative for abdominal distention and abdominal pain.  Genitourinary: Negative for dysuria, frequency and hematuria.  Neurological: Negative for speech difficulty, weakness and numbness.       Objective:   Physical Exam  Constitutional: He is oriented to person, place, and time. He appears well-developed and well-nourished.  HENT:  Head: Normocephalic and atraumatic.  Mouth/Throat: Mucous membranes are normal.  Moist mucosa; there is some white coating across the tongue, but the buccal mucosa appears moist  Eyes: EOM are normal. Pupils are equal, round, and reactive to light.  Neck: No JVD present. Carotid bruit is not present.  Cardiovascular: Normal rate, regular rhythm, S1 normal, S2 normal and normal heart sounds. Exam reveals no gallop and no friction rub.  No murmur heard. Pulmonary/Chest: Effort normal and breath sounds normal. He has no rales.  Sutures intact along midline of abdominal wall, steri-strip in place upper chest wall, no surrounding erythema or discharge from wound  Abdominal: He exhibits no distension.  Musculoskeletal: He exhibits no edema.  Neurological: He is alert and oriented to person, place, and time.  Skin: Skin is warm and dry.  Psychiatric: He has a normal mood and affect.  Vitals reviewed.   Vitals:   07/07/17 1031  BP: 132/74  Pulse: 85  Resp: 18  Temp: 98.2 F (36.8 C)  TempSrc: Oral  SpO2: 98%  Weight: 179 lb 12.8 oz (81.6 kg)  Height: 5' 3"  (1.6 m)    Wt Readings from Last 3 Encounters:  07/07/17 179 lb 12.8 oz (81.6 kg)  07/01/17 198 lb (89.8 kg)  06/29/17 191 lb 3.2 oz (86.7 kg)     Results for orders placed or  performed in visit on 07/07/17  POCT Skin KOH  Result Value Ref Range   Skin KOH, POC Negative Negative  POCT CBC  Result Value Ref Range   WBC 6.3 4.6 - 10.2 K/uL   Lymph, poc 1.4 0.6 - 3.4   POC LYMPH PERCENT 22.4 10 - 50 %L   MID (cbc) 0.2 0 - 0.9   POC MID % 3.3 0 - 12 %M   POC Granulocyte 4.7 2 - 6.9   Granulocyte percent 74.3 37 - 80 %G   RBC 4.14 (A) 4.69 - 6.13 M/uL   Hemoglobin 12.6 (A) 14.1 - 18.1 g/dL   HCT, POC 37.6 (A) 43.5 - 53.7 %   MCV 90.8 80 - 97 fL   MCH, POC 30.5 27 - 31.2 pg   MCHC 33.6 31.8 - 35.4 g/dL   RDW, POC 14.1 %   Platelet Count, POC 482 (A) 142 - 424 K/uL   MPV 7.7 0 - 99.8 fL  POCT glucose (Antonio entry)  Result Value Ref Range   POC Glucose 176 (A) 70 - 99 mg/dl   Dg Abd Acute W/chest  Result Date: 07/07/2017 CLINICAL DATA:  Nausea and vomiting EXAM: DG ABDOMEN ACUTE W/ 1V CHEST COMPARISON:  Chest radiograph June 27, 2017; abdomen series February 19, 2014 FINDINGS: PA chest: No edema or consolidation. Heart size and pulmonary vascularity are normal. No adenopathy. Patient is status post internal mammary bypass grafting. Supine and upright abdomen: There is diffuse stool throughout the colon. There is no bowel dilatation or air-fluid level to suggest bowel obstruction.  No free air. 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 IMPRESSION: Diffuse stool throughout colon. No bowel obstruction or free air. 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 There is no lung edema or consolidation. Electronically Signed   By: Lowella Grip III M.D.   On: 07/07/2017 11:51       Assessment & Plan:   Antonio Wells is a 71 y.o. male CVD (cardiovascular disease)  - Status post three-vessel bypass 11/30.  Seen for hospital follow-up. Wounds appear to be improving, has necessary assistance at home. Sore chest wounds only, denies other dyspnea/chest pain.  - Hemoglobin from postop anemia is improving, continue routine follow-up with surgeon and  cardiology.  Non-intractable vomiting with nausea, unspecified vomiting type - Plan: DG Abd Acute W/Chest  -May be related to amiodarone. Discussed with cardiology, will stop amiodarone at this point, then zofran if needed. Less likely gastroparesis.  If unable to see improvement with cessation of amiodarone, consider GI evaluation.   Chronic kidney disease, unspecified CKD stage - Plan: Basic metabolic panel, CANCELED: Basic metabolic panel  -Slight increased creatinine at ER visit. May be volume related. Repeat today stat to determine stability, then plan on recheck in 4 days.   - 6:48 PM stat BMP reviewed. Creatinine 1.87 which is a slight increase from previous reading. May have volume depletion component. EGFR 41. At that level, may need to hold metformin if remains GFR less than 45. Plan on repeat BMP in 4 days, can assess further at that time. Called patient to discuss results, left message on his voicemail that I will try to contact him again tomorrow.  Fatigue, unspecified type - Plan: Basic metabolic panel as above. May be multifactorial.  Anemia, unspecified type - Plan: POCT CBC  -Improving as above.  Type 2 diabetes mellitus with hyperglycemia, without long-term current use of insulin ([place]) - Plan: POCT glucose (Antonio entry)  -He is had decreased control, but cost prohibitive on previous medication recommendations. With decreased by mouth intake recently, will hold on any additional medication, but follow closely  Tongue coating - Plan: POCT Skin KOH  -Scraping negative for thrush, however would consider trial of Mycelex if symptoms persist through the weekend  Loss of weight  -Thought to be due to decreased by mouth intake. Follow-up in 4 days, monitor for improvement once nausea improves  No orders of the defined types were placed in this encounter.  Patient Instructions   Wounds appear to be healing. Keep planned follow up with surgeon.  Amiodarone may be causing nausea.  I will check with cardiology to see if we can stop that med and you can then use zofran if needed. If nausea continues, may need to be seen by gastroenterology.   Potassium rich foods such as bananas for now, and I will check potassium today and then again at follow up Monday to see if you need to restart supplement.   Try to avoid carbonation and caffeine. Small sips of fluids frequently, small quantities of food.  I will check kidney function today. If level indicates less than 45% function, we may need to stop metformin.  Additionally this will let me know if still ok to treat your symptoms as an outpatient.   Recheck in 4 days, sooner if worse.   IF you received an x-Wells today, you will receive an invoice from [facility]. Please contact [facility] at [phone] with questions or concerns regarding your invoice.   IF you received labwork today, you will receive an invoice from  [facility]. Please contact [facility] at [phone] with questions or concerns regarding your invoice.   Our billing staff will not be able to assist you with questions regarding bills from these companies.  You will be contacted with the lab results as soon as they are available. The fastest way to get your results is to activate your My Chart account. Instructions are located on the last page of this paperwork. If you have not heard from [place] regarding the results in 2 weeks, please contact this office.      I personally performed the services described in this documentation, which was scribed in my presence. The recorded information has been reviewed and considered for accuracy and completeness, addended by me as needed, and agree with information above.  Signed,   Antonio Ray, MD [facility].  07/07/17 1:05 PM

## 2017-07-07 NOTE — Patient Instructions (Addendum)
Wounds appear to be healing. Keep planned follow up with surgeon.  Amiodarone may be causing nausea. I will check with cardiology to see if we can stop that med and you can then use zofran if needed. If nausea continues, may need to be seen by gastroenterology.   Potassium rich foods such as bananas for now, and I will check potassium today and then again at follow up Monday to see if you need to restart supplement.   Try to avoid carbonation and caffeine. Small sips of fluids frequently, small quantities of food.  I will check kidney function today. If level indicates less than 45% function, we may need to stop metformin.  Additionally this will let me know if still ok to treat your symptoms as an outpatient.   Recheck in 4 days, sooner if worse.   IF you received an x-ray today, you will receive an invoice from [facility]. Please contact [facility] at [phone] with questions or concerns regarding your invoice.   IF you received labwork today, you will receive an invoice from [place] [facility]. Please contact [facility] at [phone] with questions or concerns regarding your invoice.   Our billing staff will not be able to assist you with questions regarding bills from these companies.  You will be contacted with the lab results as soon as they are available. The fastest way to get your results is to activate your My Chart account. Instructions are located on the last page of this paperwork. If you have not heard from [place] regarding the results in 2 weeks, please contact this office.

## 2017-07-07 NOTE — Telephone Encounter (Unsigned)
Copied from [place] [phone]. Topic: General - Other >> Jul 07, 2017  1:05 PM Neva Seat wrote: Manuela Schwartz from  Dr. Vivi Martens office [facility] has advised pt to stop taking Amiodarone.  He is having nausea and vomiting with the medication.

## 2017-07-07 NOTE — Telephone Encounter (Signed)
Forwarded message from [facility] to Dr. Carlota Raspberry

## 2017-07-08 ENCOUNTER — Telehealth: Payer: Self-pay | Admitting: Family Medicine

## 2017-07-08 NOTE — Telephone Encounter (Signed)
Copied from [place]. Topic: Quick Communication - See Telephone Encounter >> Jul 08, 2017  4:18 PM Vernona Rieger wrote: CRM for notification. See Telephone encounter for:   07/08/17.  Pt is wanting to know lab results. Please call back [phone]

## 2017-07-11 ENCOUNTER — Encounter (HOSPITAL_COMMUNITY): Payer: Self-pay | Admitting: General Practice

## 2017-07-11 ENCOUNTER — Other Ambulatory Visit: Payer: Self-pay

## 2017-07-11 ENCOUNTER — Encounter: Payer: Self-pay | Admitting: Family Medicine

## 2017-07-11 ENCOUNTER — Ambulatory Visit (INDEPENDENT_AMBULATORY_CARE_PROVIDER_SITE_OTHER): Payer: Medicare Other | Admitting: Family Medicine

## 2017-07-11 ENCOUNTER — Inpatient Hospital Stay (HOSPITAL_COMMUNITY)
Admission: AD | Admit: 2017-07-11 | Discharge: 2017-07-14 | DRG: 683 | Disposition: A | Discharge status: Home / Self Care | Payer: Medicare Other | Source: Ambulatory Visit | Attending: Internal Medicine | Admitting: Internal Medicine

## 2017-07-11 VITALS — BP 120/76 | HR 96 | Temp 98.3°F | Resp 18 | Ht 63.0 in | Wt 172.8 lb

## 2017-07-11 DIAGNOSIS — R5383 Other fatigue: Secondary | ICD-10-CM

## 2017-07-11 DIAGNOSIS — I129 Hypertensive chronic kidney disease with stage 1 through stage 4 chronic kidney disease, or unspecified chronic kidney disease: Secondary | ICD-10-CM | POA: Diagnosis present

## 2017-07-11 DIAGNOSIS — I2510 Atherosclerotic heart disease of native coronary artery without angina pectoris: Secondary | ICD-10-CM | POA: Diagnosis present

## 2017-07-11 DIAGNOSIS — Z7984 Long term (current) use of oral hypoglycemic drugs: Secondary | ICD-10-CM

## 2017-07-11 DIAGNOSIS — K146 Glossodynia: Secondary | ICD-10-CM | POA: Diagnosis not present

## 2017-07-11 DIAGNOSIS — Z8249 Family history of ischemic heart disease and other diseases of the circulatory system: Secondary | ICD-10-CM

## 2017-07-11 DIAGNOSIS — Z7982 Long term (current) use of aspirin: Secondary | ICD-10-CM

## 2017-07-11 DIAGNOSIS — G4733 Obstructive sleep apnea (adult) (pediatric): Secondary | ICD-10-CM

## 2017-07-11 DIAGNOSIS — I4891 Unspecified atrial fibrillation: Secondary | ICD-10-CM | POA: Diagnosis present

## 2017-07-11 DIAGNOSIS — E860 Dehydration: Secondary | ICD-10-CM | POA: Diagnosis present

## 2017-07-11 DIAGNOSIS — E861 Hypovolemia: Secondary | ICD-10-CM | POA: Diagnosis present

## 2017-07-11 DIAGNOSIS — E1165 Type 2 diabetes mellitus with hyperglycemia: Secondary | ICD-10-CM | POA: Diagnosis present

## 2017-07-11 DIAGNOSIS — E876 Hypokalemia: Secondary | ICD-10-CM | POA: Diagnosis present

## 2017-07-11 DIAGNOSIS — E870 Hyperosmolality and hypernatremia: Secondary | ICD-10-CM | POA: Diagnosis present

## 2017-07-11 DIAGNOSIS — Z951 Presence of aortocoronary bypass graft: Secondary | ICD-10-CM | POA: Diagnosis not present

## 2017-07-11 DIAGNOSIS — R634 Abnormal weight loss: Secondary | ICD-10-CM

## 2017-07-11 DIAGNOSIS — R112 Nausea with vomiting, unspecified: Secondary | ICD-10-CM | POA: Diagnosis not present

## 2017-07-11 DIAGNOSIS — Z9989 Dependence on other enabling machines and devices: Secondary | ICD-10-CM

## 2017-07-11 DIAGNOSIS — Z833 Family history of diabetes mellitus: Secondary | ICD-10-CM

## 2017-07-11 DIAGNOSIS — E1122 Type 2 diabetes mellitus with diabetic chronic kidney disease: Secondary | ICD-10-CM | POA: Diagnosis present

## 2017-07-11 DIAGNOSIS — R7989 Other specified abnormal findings of blood chemistry: Secondary | ICD-10-CM

## 2017-07-11 DIAGNOSIS — Z823 Family history of stroke: Secondary | ICD-10-CM

## 2017-07-11 DIAGNOSIS — IMO0002 Reserved for concepts with insufficient information to code with codable children: Secondary | ICD-10-CM | POA: Diagnosis present

## 2017-07-11 DIAGNOSIS — B370 Candidal stomatitis: Secondary | ICD-10-CM

## 2017-07-11 DIAGNOSIS — R000 Tachycardia, unspecified: Secondary | ICD-10-CM

## 2017-07-11 DIAGNOSIS — R627 Adult failure to thrive: Secondary | ICD-10-CM | POA: Diagnosis present

## 2017-07-11 DIAGNOSIS — N179 Acute kidney failure, unspecified: Principal | ICD-10-CM | POA: Diagnosis present

## 2017-07-11 DIAGNOSIS — E7800 Pure hypercholesterolemia, unspecified: Secondary | ICD-10-CM | POA: Diagnosis present

## 2017-07-11 DIAGNOSIS — I080 Rheumatic disorders of both mitral and aortic valves: Secondary | ICD-10-CM | POA: Diagnosis present

## 2017-07-11 DIAGNOSIS — R1110 Vomiting, unspecified: Secondary | ICD-10-CM

## 2017-07-11 DIAGNOSIS — E440 Moderate protein-calorie malnutrition: Secondary | ICD-10-CM

## 2017-07-11 DIAGNOSIS — N183 Chronic kidney disease, stage 3 (moderate): Secondary | ICD-10-CM | POA: Diagnosis present

## 2017-07-11 DIAGNOSIS — Y92009 Unspecified place in unspecified non-institutional (private) residence as the place of occurrence of the external cause: Secondary | ICD-10-CM

## 2017-07-11 DIAGNOSIS — T462X5A Adverse effect of other antidysrhythmic drugs, initial encounter: Secondary | ICD-10-CM | POA: Diagnosis present

## 2017-07-11 DIAGNOSIS — I10 Essential (primary) hypertension: Secondary | ICD-10-CM | POA: Diagnosis present

## 2017-07-11 HISTORY — DX: Obstructive sleep apnea (adult) (pediatric): G47.33

## 2017-07-11 HISTORY — DX: Dependence on other enabling machines and devices: Z99.89

## 2017-07-11 HISTORY — DX: Type 2 diabetes mellitus without complications: E11.9

## 2017-07-11 LAB — CBC WITH DIFFERENTIAL/PLATELET
Basophils Absolute: 0 10*3/uL (ref 0.0–0.1)
Basophils Relative: 0 %
EOS ABS: 0.1 10*3/uL (ref 0.0–0.7)
EOS PCT: 2 %
HCT: 38.3 % — ABNORMAL LOW (ref 39.0–52.0)
Hemoglobin: 12.8 g/dL — ABNORMAL LOW (ref 13.0–17.0)
LYMPHS ABS: 1.8 10*3/uL (ref 0.7–4.0)
Lymphocytes Relative: 32 %
MCH: 30.5 pg (ref 26.0–34.0)
MCHC: 33.4 g/dL (ref 30.0–36.0)
MCV: 91.2 fL (ref 78.0–100.0)
MONOS PCT: 8 %
Monocytes Absolute: 0.4 10*3/uL (ref 0.1–1.0)
Neutro Abs: 3.2 10*3/uL (ref 1.7–7.7)
Neutrophils Relative %: 58 %
PLATELETS: 448 10*3/uL — AB (ref 150–400)
RBC: 4.2 MIL/uL — AB (ref 4.22–5.81)
RDW: 13.9 % (ref 11.5–15.5)
WBC: 5.5 10*3/uL (ref 4.0–10.5)

## 2017-07-11 LAB — POCT CBC
Granulocyte percent: 67.8 %G (ref 37–80)
HEMATOCRIT: 38.9 % — AB (ref 43.5–53.7)
HEMOGLOBIN: 13.4 g/dL — AB (ref 14.1–18.1)
LYMPH, POC: 1.3 (ref 0.6–3.4)
MCH: 30.4 pg (ref 27–31.2)
MCHC: 34.4 g/dL (ref 31.8–35.4)
MCV: 88.6 fL (ref 80–97)
MID (CBC): 0.4 (ref 0–0.9)
MPV: 8.2 fL (ref 0–99.8)
POC GRANULOCYTE: 3.5 (ref 2–6.9)
POC LYMPH PERCENT: 25.1 %L (ref 10–50)
POC MID %: 7.1 % (ref 0–12)
Platelet Count, POC: 499 10*3/uL — AB (ref 142–424)
RBC: 4.39 M/uL — AB (ref 4.69–6.13)
RDW, POC: 13.9 %
WBC: 5.1 10*3/uL (ref 4.6–10.2)

## 2017-07-11 LAB — COMPREHENSIVE METABOLIC PANEL
ALBUMIN: 4 g/dL (ref 3.5–5.0)
ALT: 21 U/L (ref 17–63)
AST: 24 U/L (ref 15–41)
Alkaline Phosphatase: 100 U/L (ref 38–126)
Anion gap: 12 (ref 5–15)
BILIRUBIN TOTAL: 0.8 mg/dL (ref 0.3–1.2)
BUN: 29 mg/dL — AB (ref 6–20)
CHLORIDE: 106 mmol/L (ref 101–111)
CO2: 29 mmol/L (ref 22–32)
CREATININE: 2.19 mg/dL — AB (ref 0.61–1.24)
Calcium: 9.5 mg/dL (ref 8.9–10.3)
GFR calc Af Amer: 33 mL/min — ABNORMAL LOW (ref >60)
GFR, EST NON AFRICAN AMERICAN: 29 mL/min — AB (ref >60)
Glucose, Bld: 137 mg/dL — ABNORMAL HIGH (ref 65–99)
Potassium: 4.5 mmol/L (ref 3.5–5.1)
SODIUM: 147 mmol/L — AB (ref 135–145)
Total Protein: 7.6 g/dL (ref 6.5–8.1)

## 2017-07-11 LAB — GLUCOSE, CAPILLARY: Glucose-Capillary: 105 mg/dL — ABNORMAL HIGH (ref 65–99)

## 2017-07-11 LAB — POCT SKIN KOH: Skin KOH, POC: POSITIVE — AB

## 2017-07-11 MED ORDER — CLOTRIMAZOLE 10 MG MT TROC: 10.0000 mg | Freq: Every day | OROMUCOSAL | 0 refills | Status: DC

## 2017-07-11 MED ORDER — SODIUM CHLORIDE 0.9 % IV SOLN
INTRAVENOUS | Status: DC
  Administered 2017-07-11 – 2017-07-12 (×2): via INTRAVENOUS

## 2017-07-11 MED ORDER — INSULIN ASPART 100 UNIT/ML ~~LOC~~ SOLN
0.0000 [IU] | Freq: Three times a day (TID) | SUBCUTANEOUS | Status: DC
  Administered 2017-07-12: 1 [IU] via SUBCUTANEOUS
  Administered 2017-07-12 – 2017-07-13 (×2): 2 [IU] via SUBCUTANEOUS
  Administered 2017-07-13: 1 [IU] via SUBCUTANEOUS

## 2017-07-11 MED ORDER — PROMETHAZINE HCL 25 MG PO TABS
12.5000 mg | ORAL_TABLET | Freq: Four times a day (QID) | ORAL | Status: DC | PRN
  Administered 2017-07-12: 12.5 mg via ORAL
  Filled 2017-07-11: qty 1

## 2017-07-11 MED ORDER — NYSTATIN 100000 UNIT/ML MT SUSP
5.0000 mL | Freq: Four times a day (QID) | OROMUCOSAL | Status: DC
  Administered 2017-07-11 – 2017-07-13 (×3): 500000 [IU] via ORAL
  Filled 2017-07-11 (×4): qty 5

## 2017-07-11 NOTE — Progress Notes (Addendum)
Subjective:  By signing my name below, I, [name], attest that this documentation has been prepared under the direction and in the presence of [name], MD Electronically Signed: Ladene Artist, ED Scribe 07/11/2017 at 12:47 PM.   Patient ID: [name], male    DOB: 1945-10-31, 71 y.o.   MRN: [id number]  Chief Complaint  Patient presents with  . Follow-up    recheck his nausea, weught, potassium and kidneys    HPI ROBERTA KELLY is a 71 y.o. male who presents to Primary Care at [facility] for f/u. Last seen 12/13 for a transition of care visit since CABG x 3 on 11/30 by Dr. Cyndia Bent. Was having some significant nausea, episodic vomiting, decreased appetite and weight loss last visit. Weight had decreased from 191 on 12/5, 198 on 12/7 to 179 on 12/13. New med from the hospital was amiodarone for a-fib. It was thought that may be contributing to his nausea. Discussed with cardiology. Decided to stop amiodarone completely. There was some concern of using Zofran while taking amiodarone due to QT prolongation. Also discussed that he may have nausea, vomiting for a few days due to long-half life of amiodarone.  Pt states that he did start the amiodarone after last OV on 12/13. He has a f/u appointment with cardiology tomorrow. Pt has not noticed a change in nausea, vomiting. He reports 1 episode of emesis 3 days ago, none 2 days ago, 1 episode yesterday and another episode of emesis in the car while on the way here today after consuming Glucerna. Reports that he typically vomits ~30 mins after eating. Pt states he has not been able to tolerate any other foods. Also reports nausea is unchanged; states food doesn't taste well and seems like it wants to come back up as he is swallowing it. Also reports that toothpaste now makes him nauseous. He took his first dose of Zofran last night which provided some relief. No other meds tried PTA. Reports he has been sipping water and has had 2 voids  so far today and 3-4 yesterday. Denies hematemesis, blood in stools, diarrhea, fever, abdominal pain.  Wt Readings from Last 3 Encounters:  07/11/17 172 lb 12.8 oz (78.4 kg)  07/07/17 179 lb 12.8 oz (81.6 kg)  07/01/17 198 lb (89.8 kg)   CKD with Increased Creatinine  Creatinine range around 1.5 with recent 1.3-1.99, 1.78 on 12/7 with egfr of 41. Considered holding Metformin if he remained below 45. Lab Results  Component Value Date   CREATININE 1.87 (H) 07/07/2017   Patient Active Problem List   Diagnosis Date Noted  . 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 CAD (coronary artery disease) 05/25/2017  . Unstable angina ([place])   . Hypertensive heart disease 07/14/2015  . Lower extremity edema 07/14/2015  . Aortic stenosis 01/09/2015  . OSA on CPAP 12/12/2014  . Hypersomnia with sleep apnea 12/12/2014  . Obstructive sleep apnea (adult) (pediatric) 02/04/2014  . 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 Obesity 02/04/2014  . Mitral regurgitation and aortic stenosis 01/02/2014  . DOE (dyspnea on exertion) 11/26/2013  . Gout 08/12/2011  . Hypertension 08/12/2011  . High cholesterol 08/12/2011  . OSA (obstructive sleep apnea) 08/12/2011  . Diabetes type 2, uncontrolled ([place]) 08/12/2011   Past Medical History:  Diagnosis Date  . Arthritis   . Coronary artery disease   . Diabetes mellitus without complication ([place])   . Gout   . Heart murmur   . High cholesterol   .  Hypertension   . Sleep apnea    Past Surgical History:  Procedure Laterality Date  . COLONOSCOPY  2008  . CORONARY ARTERY BYPASS GRAFT N/A 06/24/2017   Procedure: CORONARY ARTERY BYPASS GRAFTING (CABG) x 3 ; Using Left Internal Mammary Artery and Right Great Saphenous Leg Vein Harvested Endoscopically;  Surgeon: Gaye Pollack, MD;  Location: [place];  Service: Open Heart Surgery;  Laterality: N/A;  . LEFT HEART CATH AND CORONARY ANGIOGRAPHY N/A 05/25/2017   Procedure: LEFT HEART CATH AND CORONARY ANGIOGRAPHY;  Surgeon: Wellington Hampshire, MD;  Location: [facility];  Service: Cardiovascular;  Laterality: N/A;  . TEE WITHOUT CARDIOVERSION N/A 06/24/2017   Procedure: TRANSESOPHAGEAL ECHOCARDIOGRAM (TEE);  Surgeon: Gaye Pollack, MD;  Location: [facility];  Service: Open Heart Surgery;  Laterality: N/A;   Allergies  Allergen Reactions  . Penicillins Other (See Comments)    Has patient had a PCN reaction causing immediate rash, facial/tongue/throat swelling, SOB or lightheadedness with hypotension: No Has patient had a PCN reaction causing severe rash involving mucus membranes or skin necrosis: No Has patient had a PCN reaction that required hospitalization: No Has patient had a PCN reaction occurring within the last 10 years: No If all of the above answers are "NO", then may proceed with Cephalosporin use.   Passed out ? SYNCOPE ?  Marland Kitchen Lisinopril Cough  . Oxycodone Other (See Comments)    hallucinations  . Zofran [Ondansetron Hcl]     Has an interaction with another medication pt takes   . Cortisone Nausea And Vomiting and Other (See Comments)    HICCUPS  . Tramadol Nausea And Vomiting and Other (See Comments)    HICCUPS   Prior to Admission medications   Medication Sig Start Date End Date Taking? Authorizing Provider  amiodarone (PACERONE) 400 MG tablet Take 1 tablet (400 mg total) by mouth every 12 (twelve) hours. For 3 days, then 400 mg once daily 06/29/17   Gold, Wayne E, PA-C  amLODipine (NORVASC) 10 MG tablet Take 1 tablet (10 mg total) by mouth daily. 05/12/17   [name], MD  aspirin EC 325 MG EC tablet Take 1 tablet (325 mg total) by mouth daily. 06/29/17   Gold, Wayne E, PA-C  atorvastatin (LIPITOR) 80 MG tablet Take 1 tablet (80 mg total) by mouth daily at 6 PM. Patient taking differently: Take 80 mg by mouth every morning.  05/26/17   Bhagat, Crista Luria, PA  colchicine 0.6 MG tablet Take 1-2 tablets (0.6-1.2 mg total) by mouth daily as needed (for gout flare). 2 po at onset of flair and  then repeat in 1 h for acute gout attack then 1 po qd until pain free 05/31/13   Harvie Heck, PA-C  furosemide (LASIX) 40 MG tablet Take 1 tablet (40 mg total) by mouth daily. 06/29/17   Gold, Wayne E, PA-C  glipiZIDE (GLUCOTROL) 10 MG tablet TAKE 1 TABLET BY MOUTH TWICE A DAY BEFORE MEALS. Patient taking differently: Take 10 mg by mouth 2 (two) times daily before a meal.  05/12/17   [name], MD  metFORMIN (GLUCOPHAGE) 500 MG tablet TAKE 1 TABLET (500 MG TOTAL) BY MOUTH 2 (TWO) TIMES DAILY WITH A MEAL. Patient taking differently: Take 500 mg by mouth 2 (two) times daily with a meal.  05/12/17   [name], MD  metoprolol tartrate (LOPRESSOR) 50 MG tablet Take 1 tablet (50 mg total) by mouth 2 (two) times daily. 05/12/17   [name]  R, MD  ondansetron (ZOFRAN) 4 MG tablet Take 1 tablet (4 mg total) by mouth every 6 (six) hours. Patient not taking: Reported on 07/07/2017 07/01/17   Page, Nathan Mali, MD  oxyCODONE (OXY IR/ROXICODONE) 5 MG immediate release tablet Take 1-2 tablets (5-10 mg total) by mouth every 6 (six) hours as needed for severe pain. 06/29/17   Gold, Patrick Jupiter E, PA-C  potassium chloride SA (K-DUR,KLOR-CON) 20 MEQ tablet Take 1 tablet (20 mEq total) by mouth daily. 06/29/17   John Giovanni, PA-C   Social History   Socioeconomic History  . Marital status: Married    Spouse name: [name]  . Number of children: 3  . Years of education: 71  . Highest education level: Not on file  Social Needs  . Financial resource strain: Not on file  . Food insecurity - worry: Not on file  . Food insecurity - inability: Not on file  . Transportation needs - medical: Not on file  . Transportation needs - non-medical: Not on file  Occupational History  . Occupation: Musician  Tobacco Use  . Smoking status: Former Smoker    Packs/day: 2.00    Years: 15.00    Pack years: 30.00    Types: Cigarettes    Last attempt to quit: 07/26/1998    Years since quitting: 18.9  . Smokeless  tobacco: Never Used  Substance and Sexual Activity  . Alcohol use: No    Alcohol/week: 0.0 oz  . Drug use: No  . Sexual activity: Yes  Other Topics Concern  . Not on file  Social History Narrative   Patient is married [name]) and lives at home with his wife.   Patient has three adult children.   Patient is retired.   Patient is a Research officer, political party.   Patient is right-handed.   Patient drinks one cup of coffee daily and 2-3 sodas per day.      Review of Systems  Constitutional: Negative for fever.  Gastrointestinal: Positive for nausea and vomiting. Negative for abdominal pain, blood in stool and diarrhea.      Objective:   Physical Exam  Constitutional: He is oriented to person, place, and time. He appears well-developed and well-nourished.  HENT:  Head: Normocephalic and atraumatic.  White coating across the tongue. Possible slight white coating on oral mucosa. Posterior oropharynx clear.  Eyes: EOM are normal. Pupils are equal, round, and reactive to light.  Neck: No JVD present. Carotid bruit is not present.  Cardiovascular: Regular rhythm and normal heart sounds. Tachycardia present.  No murmur heard. Slightly tachy  Pulmonary/Chest: Effort normal and breath sounds normal. He has no rales.  Abdominal: There is no tenderness (no focal).  Musculoskeletal: He exhibits no edema.  Neurological: He is alert and oriented to person, place, and time.  Skin: Skin is warm and dry.  Psychiatric: He has a normal mood and affect.  Vitals reviewed.  Vitals:   07/11/17 1213  BP: 120/76  Pulse: 96  Resp: 18  Temp: 98.3 F (36.8 C)  TempSrc: Oral  SpO2: 98%  Weight: 172 lb 12.8 oz (78.4 kg)  Height: 5' 3"  (1.6 m)   Results for orders placed or performed in visit on 07/11/17  POCT CBC  Result Value Ref Range   WBC 5.1 4.6 - 10.2 K/uL   Lymph, poc 1.3 0.6 - 3.4   POC LYMPH PERCENT 25.1 10 - 50 %L   MID (cbc) 0.4 0 - 0.9   POC MID % 7.1 0 -  12 %M   POC Granulocyte 3.5 2 - 6.9     Granulocyte percent 67.8 37 - 80 %G   RBC 4.39 (A) 4.69 - 6.13 M/uL   Hemoglobin 13.4 (A) 14.1 - 18.1 g/dL   HCT, POC 38.9 (A) 43.5 - 53.7 %   MCV 88.6 80 - 97 fL   MCH, POC 30.4 27 - 31.2 pg   MCHC 34.4 31.8 - 35.4 g/dL   RDW, POC 13.9 %   Platelet Count, POC 499 (A) 142 - 424 K/uL   MPV 8.2 0 - 99.8 fL  POCT Skin KOH  Result Value Ref Range   Skin KOH, POC Positive (A) Negative   Orthostatic VS for the past 24 hrs (Last 3 readings):  BP- Lying Pulse- Lying BP- Sitting Pulse- Sitting BP- Standing at 0 minutes Pulse- Standing at 0 minutes  07/11/17 1312 98/66 88 117/82 92 109/76 96       Assessment & Plan:  MOHIT ZIRBES is a 71 y.o. male Nausea and vomiting, intractability of vomiting not specified, unspecified vomiting type - Plan: Basic metabolic panel, POCT CBC Elevated serum creatinine - Plan: Basic metabolic panel Fatigue, unspecified type - Plan: Basic metabolic panel Loss of weight Tachycardia - Plan: Orthostatic vital signs  - Persistent nausea, vomiting, and progressive weight loss since hospital discharge. Initially more dysphagia, now more nausea with subsequent decrease in appetite. Initially thought to be amiodarone. No change in symptoms after discontinuation approximate 4 days ago. Did discuss potential persistent symptoms due to half-life of amiodarone, but has had worsening of symptoms with vomiting again this morning, inability to consume sufficient calories, and some symptoms with fluids as well. One dose of Zofran last night, but has been avoiding Zofran due to potential QT prolongation with amiodarone.  -Minimal increasing creatinine last Thursday compared to previous reading. Initially planned on outpatient recheck today. Will likely be performed at Hospital to rule out AKI.   -Blood pressure borderline low now in the office at 98/66 with initial orthostatic testing, but does not appear to be orthostatic based on other blood pressures. Tachycardia, but  regular rhythm. May have some volume depletion and already took lasix this am.   - Discussed with Gastroenterology, recommended observation versus hospitalization for persistent symptoms, potential IV fluids, and endoscopy.  - Called hospitalist- discussed with Dr. Marthenia Rolling for direct admission - telemetry bed requested, patient will be at home until bed placement.  ER precautions discussed if any worsening sooner.  Soreness of tongue - Plan: POCT Skin KOH Thrush - Plan: clotrimazole (MYCELEX) 10 MG troche  - Testing negative last visit, positive KOH today. Do not visualize any posterior oropharynx symptoms, but with symptoms above, differential includes candidal esophagitis. Less likely without true dysphagia or pain with swallowing at this time. Further eval/treatment in hospital.   Over 1 hour of coordination of care with over 40 minutes of face-to-face care, greater than 50% counseling.  No orders of the defined types were placed in this encounter.  Patient Instructions       IF you received an x-ray today, you will receive an invoice from [facility]. Please contact [facility] at [phone] with questions or concerns regarding your invoice.   IF you received labwork today, you will receive an invoice from [facility]. Please contact [facility] at [phone] with questions or concerns regarding your invoice.   Our billing staff will not be able to assist you with questions regarding bills from these companies.  You will be contacted  with the lab results as soon as they are available. The fastest way to get your results is to activate your My Chart account. Instructions are located on the last page of this paperwork. If you have not heard from [place] regarding the results in 2 weeks, please contact this office.      I personally performed the services described in this documentation, which was scribed in my presence. The recorded information has been reviewed and  considered for accuracy and completeness, addended by me as needed, and agree with information above.  Signed,   [name], MD Primary [facility] at [place].  07/11/17 1:36 PM

## 2017-07-11 NOTE — Patient Instructions (Addendum)
Unfortunately with continued nausea symptoms, weight loss, and increased kidney test, it may be best to have further evaluation at the hospital for fluids and possible endoscopy to determine if there are other reasons besides the amiodarone for your nausea.  Your blood pressure was also on the low side on recheck today, so will need to be monitored closely as you are on blood pressure medicines and fluid pill.   Your test was positive for thrush today - this can be treated and decided in hospital if other testing needed.   I have put in the request for the hospital bed.  The hospital will call you when that is ready.If you have any worsening symptoms - can go through emergency room.   Oral Thrush, Adult Oral thrush is an infection in your mouth and throat. It causes white patches on your tongue and in your mouth. Follow these instructions at home: Helping with soreness  To lessen your pain: ? Drink cold liquids, like water and iced tea. ? Eat frozen ice pops or frozen juices. ? Eat foods that are easy to swallow, like gelatin and ice cream. ? Drink from a straw if the patches in your mouth are painful. General instructions   Take or use over-the-counter and prescription medicines only as told by your doctor. Medicine for oral thrush may be something to swallow, or it may be something to put on the infected area.  Eat plain yogurt that has live cultures in it. Read the label to make sure.  If you wear dentures: ? Take out your dentures before you go to bed. ? Brush them well. ? Soak them in a denture cleaner.  Rinse your mouth with warm salt-water many times a day. To make the salt-water mixture, completely dissolve 1/2-1 teaspoon of salt in 1 cup of warm water. Contact a doctor if:  Your problems are getting worse.  Your problems do not get better in less than 7 days with treatment.  Your infection is spreading. This may show as white patches on the skin outside of your  mouth.  You are nursing your baby and you have redness and pain in the nipples. This information is not intended to replace advice given to you by your health care provider. Make sure you discuss any questions you have with your health care provider. Document Released: 10/06/2009 Document Revised: 04/05/2016 Document Reviewed: 04/05/2016 Elsevier Interactive Patient Education  2017 [employer].   IF you received an x-ray today, you will receive an invoice from [facility]. Please contact [facility] at [phone] with questions or concerns regarding your invoice.   IF you received labwork today, you will receive an invoice from [facility]. Please contact [facility] at [phone] with questions or concerns regarding your invoice.   Our billing staff will not be able to assist you with questions regarding bills from these companies.  You will be contacted with the lab results as soon as they are available. The fastest way to get your results is to activate your My Chart account. Instructions are located on the last page of this paperwork. If you have not heard from [place] regarding the results in 2 weeks, please contact this office.

## 2017-07-11 NOTE — H&P (Signed)
History and Physical    Antonio Wells [id number]:[id number] DOB: 1945/09/06 DOA: 07/11/2017  PCP: [name], MD  Patient coming from:  home  Chief Complaint:  Not eating  HPI: Antonio Wells is a 71 y.o. male with medical history significant of CABG one month ago, CKD, OSA comes in with one month of not eating well, foul taste to food, no appetite and 28 lbs wt loss since his surgery.  He was sent home not eating well.  He reports no fevers, no sob, no chest pain.  No swelling In his legs.  He went to see his pcp and was diagnosed with thrush after a swab was done from his cheek and sent for direct admit for thrush, FTT and dehydration.  Pt reports weakness but no dizziness or syncope with walking.  No n/v/d.  He denies pain with swallowing or any dysphagia.  He has only been drinking liquids.  Review of Systems: As per HPI otherwise 10 point review of systems negative.   Past Medical History:  Diagnosis Date  . Arthritis   . CKD (chronic kidney disease), stage III ([place])   . Coronary artery disease    a.  CABGx3 in 05/2017.  . Diabetes mellitus ([place])   . Gout   . Heart murmur   . High cholesterol   . Hypertension   . Mild aortic stenosis   . Mild mitral regurgitation   . Postoperative atrial fibrillation ([place]) 06/2017  . Sleep apnea     Past Surgical History:  Procedure Laterality Date  . COLONOSCOPY  2008  . CORONARY ARTERY BYPASS GRAFT N/A 06/24/2017   Procedure: CORONARY ARTERY BYPASS GRAFTING (CABG) x 3 ; Using Left Internal Mammary Artery and Right Great Saphenous Leg Vein Harvested Endoscopically;  Surgeon: Gaye Pollack, MD;  Location: [facility];  Service: Open Heart Surgery;  Laterality: N/A;  . LEFT HEART CATH AND CORONARY ANGIOGRAPHY N/A 05/25/2017   Procedure: LEFT HEART CATH AND CORONARY ANGIOGRAPHY;  Surgeon: Wellington Hampshire, MD;  Location: [facility];  Service: Cardiovascular;  Laterality: N/A;  . TEE WITHOUT CARDIOVERSION N/A 06/24/2017   Procedure: TRANSESOPHAGEAL ECHOCARDIOGRAM (TEE);  Surgeon: Gaye Pollack, MD;  Location: [facility];  Service: Open Heart Surgery;  Laterality: N/A;     reports that he quit smoking about 18 years ago. His smoking use included cigarettes. He has a 30.00 pack-year smoking history. he has never used smokeless tobacco. He reports that he does not drink alcohol or use drugs.  Allergies  Allergen Reactions  . Penicillins Other (See Comments)    Has patient had a PCN reaction causing immediate rash, facial/tongue/throat swelling, SOB or lightheadedness with hypotension: No Has patient had a PCN reaction causing severe rash involving mucus membranes or skin necrosis: No Has patient had a PCN reaction that required hospitalization: No Has patient had a PCN reaction occurring within the last 10 years: No If all of the above answers are "NO", then may proceed with Cephalosporin use.   Passed out ? SYNCOPE ?  Marland Kitchen Lisinopril Cough  . Amiodarone Nausea And Vomiting  . Oxycodone Other (See Comments)    hallucinations  . Zofran [Ondansetron Hcl]     Has an interaction with another medication pt takes   . Cortisone Nausea And Vomiting and Other (See Comments)    HICCUPS  . Tramadol Nausea And Vomiting and Other (See Comments)    HICCUPS    Family History  Problem Relation Age of Onset  .  Hypertension Mother   . Kidney disease Mother   . Diabetes Mother   . Hypertension Father   . Cancer Brother        lung  . Stroke Brother   . Colon cancer Neg Hx     Prior to Admission medications   Medication Sig Start Date End Date Taking? Authorizing Provider  amLODipine (NORVASC) 10 MG tablet Take 1 tablet (10 mg total) by mouth daily. 05/12/17   [name], MD  aspirin EC 325 MG EC tablet Take 1 tablet (325 mg total) by mouth daily. 06/29/17   Gold, Wayne E, PA-C  atorvastatin (LIPITOR) 80 MG tablet Take 1 tablet (80 mg total) by mouth daily at 6 PM. Patient taking differently: Take 80 mg by  mouth every morning.  05/26/17   Bhagat, Crista Luria, PA  clotrimazole (MYCELEX) 10 MG troche Take 1 tablet (10 mg total) by mouth 5 (five) times daily. 07/11/17   [name], MD  colchicine 0.6 MG tablet Take 1-2 tablets (0.6-1.2 mg total) by mouth daily as needed (for gout flare). 2 po at onset of flair and then repeat in 1 h for acute gout attack then 1 po qd until pain free 05/31/13   Harvie Heck, PA-C  furosemide (LASIX) 40 MG tablet Take 1 tablet (40 mg total) by mouth daily. 06/29/17   Gold, Wayne E, PA-C  glipiZIDE (GLUCOTROL) 10 MG tablet TAKE 1 TABLET BY MOUTH TWICE A DAY BEFORE MEALS. Patient taking differently: Take 10 mg by mouth 2 (two) times daily before a meal.  05/12/17   [name], MD  metFORMIN (GLUCOPHAGE) 500 MG tablet TAKE 1 TABLET (500 MG TOTAL) BY MOUTH 2 (TWO) TIMES DAILY WITH A MEAL. Patient taking differently: Take 500 mg by mouth 2 (two) times daily with a meal.  05/12/17   [name], MD  metoprolol tartrate (LOPRESSOR) 50 MG tablet Take 1 tablet (50 mg total) by mouth 2 (two) times daily. 05/12/17   [name], MD  ondansetron (ZOFRAN) 4 MG tablet Take 1 tablet (4 mg total) by mouth every 6 (six) hours. Patient not taking: Reported on 07/07/2017 07/01/17   Page, Nathan Mali, MD    Physical Exam: Vitals:   07/11/17 1757  BP: 132/86  Pulse: 96  Resp: 16  Temp: 99.1 F (37.3 C)  TempSrc: Oral  SpO2: 96%  Weight: 77.9 kg (171 lb 12.8 oz)  Height: 5\' 3"  (1.6 m)      Constitutional: NAD, calm, comfortable Vitals:   07/11/17 1757  BP: 132/86  Pulse: 96  Resp: 16  Temp: 99.1 F (37.3 C)  TempSrc: Oral  SpO2: 96%  Weight: 77.9 kg (171 lb 12.8 oz)  Height: 5\' 3"  (1.6 m)   Eyes: PERRL, lids and conjunctivae normal ENMT: Mucous membranes are dry. Posterior pharynx clear of any exudate or lesions.Normal dentition. Oral thrush Neck: normal, supple, no masses, no thyromegaly   Respiratory: clear to auscultation bilaterally, no  wheezing, no crackles. Normal respiratory effort. No accessory muscle use.  Cardiovascular: Regular rate and rhythm, no murmurs / rubs / gallops. No extremity edema. 2+ pedal pulses. No carotid bruits.  Abdomen: no tenderness, no masses palpated. No hepatosplenomegaly. Bowel sounds positive.  Musculoskeletal: no clubbing / cyanosis. No joint deformity upper and lower extremities. Good ROM, no contractures. Normal muscle tone.  Skin: no rashes, lesions, ulcers. No induration Neurologic: CN 2-12 grossly intact. Sensation intact, DTR normal. Strength 5/5 in all 4.  Psychiatric: Normal judgment  and insight. Alert and oriented x 3. Normal mood.    Labs on Admission: I have personally reviewed following labs and imaging studies  CBC: Recent Labs  Lab 07/07/17 1143 07/11/17 1323  WBC 6.3 5.1  HGB 12.6* 13.4*  HCT 37.6* 38.9*  MCV 90.8 38.2   Basic Metabolic Panel: Recent Labs  Lab 07/07/17 1143  NA 145*  K 4.9  CL 103  CO2 29  GLUCOSE 173*  BUN 25  CREATININE 1.87*  CALCIUM 9.5   GFR: Estimated Creatinine Clearance: 33.9 mL/min (A) (by C-G formula based on SCr of 1.87 mg/dL (H)). Liver Function Tests: No results for input(s): AST, ALT, ALKPHOS, BILITOT, PROT, ALBUMIN in the last 168 hours. No results for input(s): LIPASE, AMYLASE in the last 168 hours. No results for input(s): AMMONIA in the last 168 hours. Coagulation Profile: No results for input(s): INR, PROTIME in the last 168 hours. Cardiac Enzymes: No results for input(s): CKTOTAL, CKMB, CKMBINDEX, TROPONINI in the last 168 hours. BNP (last 3 results) Recent Labs    04/27/17 1618  PROBNP 46   HbA1C: No results for input(s): HGBA1C in the last 72 hours. CBG: No results for input(s): GLUCAP in the last 168 hours. Lipid Profile: No results for input(s): CHOL, HDL, LDLCALC, TRIG, CHOLHDL, LDLDIRECT in the last 72 hours. Thyroid Function Tests: No results for input(s): TSH, T4TOTAL, FREET4, T3FREE, THYROIDAB in the  last 72 hours. Anemia Panel: No results for input(s): VITAMINB12, FOLATE, FERRITIN, TIBC, IRON, RETICCTPCT in the last 72 hours. Urine analysis:    Component Value Date/Time   COLORURINE YELLOW 07/01/2017 1227   APPEARANCEUR CLEAR 07/01/2017 1227   LABSPEC 1.019 07/01/2017 1227   PHURINE 6.0 07/01/2017 1227   GLUCOSEU NEGATIVE 07/01/2017 1227   HGBUR SMALL (A) 07/01/2017 1227   BILIRUBINUR NEGATIVE 07/01/2017 1227   BILIRUBINUR neg 02/19/2014 1456   KETONESUR NEGATIVE 07/01/2017 1227   PROTEINUR 30 (A) 07/01/2017 1227   UROBILINOGEN 0.2 02/20/2014 1039   NITRITE NEGATIVE 07/01/2017 1227   LEUKOCYTESUR NEGATIVE 07/01/2017 1227   Sepsis Labs: !!!!!!!!!!!!!!!!!!!!!!!!!!!!!!!!!!!!!!!!!!!! @LABRCNTIP (procalcitonin:4,lacticidven:4) )No results found for this or any previous visit (from the past 240 hour(s)).   Radiological Exams on Admission: No results found.   Assessment/Plan 71 yo male with oral thrush, FTT , dehydration Principal Problem:   Thrush- nystatin qid.    Active Problems:   FTT (failure to thrive) in adult- hopefully this is due to his thrush and will improve.  Ck labs   Hypertension- cont home meds   Diabetes type 2, uncontrolled ([place])- ssi   Mitral regurgitation and aortic stenosis- noted   OSA on CPAP- noted   S/P CABG x 3- no cp or sob or fluid overload    DVT prophylaxis:  scds Code Status:  full Family Communication:  wife Disposition Plan:  Per day team Consults called:  none Admission status:  obs  Labs pending   Shanece Cochrane A MD [facility]  If 7PM-7AM, please contact night-coverage [email or web address] Password [facility]  07/11/2017, 7:35 PM

## 2017-07-11 NOTE — Progress Notes (Deleted)
Cardiology Office Note    Date:  07/11/2017  ID:  Antonio Wells, DOB 11-13-1945, MRN [id number] PCP:  [name], MD  Cardiologist:  Dr. Meda Coffee   Chief Complaint: f/u CABG  History of Present Illness:  Antonio Wells is a 71 y.o. male with history of CAD with unstable angina s/p recent CABGx3 in 05/2017 with post-op atrial fib, HTN, HLD, DM, mild AS/MR, CKD stage III, sleep apnea who presents for post-op follow-up. He was evaluated for unstable angina in October 2018 with abnormal cardiac CT. Cardiac cath showed obstructive 2V disease. Pre-op echo showed mild LVH, EF 60-65%, grade 1 DD, mild AS/MR. He returned for outpatient CABG and underwent CABGx3 with LIMA-LAD and sequential SVG to OM1/OM3. He did have post-op atrial fib treated with amiodarone. At f/u with PCP last week he reported persistent nausea and occasional vomiting so amiodarone was stopped. Labs on 07/07/17 showed Cr 1.87, Na 145, K 4.9 (repeat yesterday ***), Hgb 13.4, Plt 499. Recent labs otherwise showed normal TSH, LDL 81. He did not require anticoagulation at discharge.  plan for lipids  CAD Paroxysmal atrial fib Essential HTN Hyperlipidemia    Past Medical History:  Diagnosis Date  . Arthritis   . CKD (chronic kidney disease), stage III ([place])   . Coronary artery disease    a.  CABGx3 in 05/2017.  . Diabetes mellitus ([place])   . Gout   . Heart murmur   . High cholesterol   . Hypertension   . Mild aortic stenosis   . Mild mitral regurgitation   . Postoperative atrial fibrillation ([place]) 06/2017  . Sleep apnea     Past Surgical History:  Procedure Laterality Date  . COLONOSCOPY  2008  . CORONARY ARTERY BYPASS GRAFT N/A 06/24/2017   Procedure: CORONARY ARTERY BYPASS GRAFTING (CABG) x 3 ; Using Left Internal Mammary Artery and Right Great Saphenous Leg Vein Harvested Endoscopically;  Surgeon: Gaye Pollack, MD;  Location: [facility];  Service: Open Heart Surgery;  Laterality: N/A;  . LEFT HEART  CATH AND CORONARY ANGIOGRAPHY N/A 05/25/2017   Procedure: LEFT HEART CATH AND CORONARY ANGIOGRAPHY;  Surgeon: Wellington Hampshire, MD;  Location: [facility];  Service: Cardiovascular;  Laterality: N/A;  . TEE WITHOUT CARDIOVERSION N/A 06/24/2017   Procedure: TRANSESOPHAGEAL ECHOCARDIOGRAM (TEE);  Surgeon: Gaye Pollack, MD;  Location: [facility];  Service: Open Heart Surgery;  Laterality: N/A;    Current Medications: No outpatient medications have been marked as taking for the 07/12/17 encounter (Appointment) with Charlie Pitter, PA-C.     Allergies:   Penicillins; Lisinopril; Amiodarone; Oxycodone; Zofran [ondansetron hcl]; Cortisone; and Tramadol   Social History   Socioeconomic History  . Marital status: Married    Spouse name: [name]  . Number of children: 3  . Years of education: 12  . Highest education level: Not on file  Social Needs  . Financial resource strain: Not on file  . Food insecurity - worry: Not on file  . Food insecurity - inability: Not on file  . Transportation needs - medical: Not on file  . Transportation needs - non-medical: Not on file  Occupational History  . Occupation: Musician  Tobacco Use  . Smoking status: Former Smoker    Packs/day: 2.00    Years: 15.00    Pack years: 30.00    Types: Cigarettes    Last attempt to quit: 07/26/1998    Years since quitting: 18.9  . Smokeless tobacco: Never Used  Substance and Sexual Activity  . Alcohol use: No    Alcohol/week: 0.0 oz  . Drug use: No  . Sexual activity: Yes  Other Topics Concern  . Not on file  Social History Narrative   Patient is married [name]) and lives at home with his wife.   Patient has three adult children.   Patient is retired.   Patient is a Research officer, political party.   Patient is right-handed.   Patient drinks one cup of coffee daily and 2-3 sodas per day.        Family History:  Family History  Problem Relation Age of Onset  . Hypertension Mother   . Kidney disease Mother   .  Diabetes Mother   . Hypertension Father   . Cancer Brother        lung  . Stroke Brother   . Colon cancer Neg Hx    ***  ROS:   Please see the history of present illness. Otherwise, review of systems is positive for ***.  All other systems are reviewed and otherwise negative.    PHYSICAL EXAM:   VS:  There were no vitals taken for this visit.  BMI: There is no height or weight on file to calculate BMI. GEN: Well nourished, well developed, in no acute distress  HEENT: normocephalic, atraumatic Neck: no JVD, carotid bruits, or masses Cardiac: ***RRR; no murmurs, rubs, or gallops, no edema  Respiratory:  clear to auscultation bilaterally, normal work of breathing GI: soft, nontender, nondistended, + BS MS: no deformity or atrophy  Skin: warm and dry, no rash Neuro:  Alert and Oriented x 3, Strength and sensation are intact, follows commands Psych: euthymic mood, full affect  Wt Readings from Last 3 Encounters:  07/11/17 172 lb 12.8 oz (78.4 kg)  07/07/17 179 lb 12.8 oz (81.6 kg)  07/01/17 198 lb (89.8 kg)      Studies/Labs Reviewed:   EKG:  EKG was ordered today and personally reviewed by me and demonstrates *** EKG was not ordered today.***  Recent Labs: 04/27/2017: NT-Pro BNP 46 06/25/2017: TSH 0.614 06/26/2017: Magnesium 2.3 07/01/2017: ALT 24; Platelets 309 07/07/2017: BUN 25; Creatinine, Ser 1.87; Potassium 4.9; Sodium 145 07/11/2017: Hemoglobin 13.4   Lipid Panel    Component Value Date/Time   CHOL 159 05/12/2017 1128   TRIG 166 (H) 05/12/2017 1128   HDL 45 05/12/2017 1128   CHOLHDL 3.5 05/12/2017 1128   CHOLHDL 2.8 02/24/2016 0749   VLDL 24 02/24/2016 0749   LDLCALC 81 05/12/2017 1128    Additional studies/ records that were reviewed today include: Summarized above.***    ASSESSMENT & PLAN:   1. ***  Disposition: F/u with ***   Medication Adjustments/Labs and Tests Ordered: Current medicines are reviewed at length with the patient today.   Concerns regarding medicines are outlined above. Medication changes, Labs and Tests ordered today are summarized above and listed in the Patient Instructions accessible in Encounters.   Signed, Charlie Pitter, PA-C  07/11/2017 [facility] [place], [place],   [address] Phone: [phone]; Fax: [phone]

## 2017-07-12 ENCOUNTER — Observation Stay (HOSPITAL_COMMUNITY): Payer: Medicare Other

## 2017-07-12 ENCOUNTER — Ambulatory Visit: Payer: Medicare Other | Admitting: Physician Assistant

## 2017-07-12 ENCOUNTER — Encounter (HOSPITAL_COMMUNITY): Payer: Self-pay | Admitting: *Deleted

## 2017-07-12 DIAGNOSIS — Z823 Family history of stroke: Secondary | ICD-10-CM | POA: Diagnosis not present

## 2017-07-12 DIAGNOSIS — E876 Hypokalemia: Secondary | ICD-10-CM | POA: Diagnosis not present

## 2017-07-12 DIAGNOSIS — E1122 Type 2 diabetes mellitus with diabetic chronic kidney disease: Secondary | ICD-10-CM | POA: Diagnosis not present

## 2017-07-12 DIAGNOSIS — N183 Chronic kidney disease, stage 3 (moderate): Secondary | ICD-10-CM | POA: Diagnosis present

## 2017-07-12 DIAGNOSIS — N189 Chronic kidney disease, unspecified: Secondary | ICD-10-CM | POA: Diagnosis not present

## 2017-07-12 DIAGNOSIS — Z8249 Family history of ischemic heart disease and other diseases of the circulatory system: Secondary | ICD-10-CM | POA: Diagnosis not present

## 2017-07-12 DIAGNOSIS — N179 Acute kidney failure, unspecified: Secondary | ICD-10-CM | POA: Diagnosis not present

## 2017-07-12 DIAGNOSIS — E870 Hyperosmolality and hypernatremia: Secondary | ICD-10-CM | POA: Diagnosis not present

## 2017-07-12 DIAGNOSIS — Z7984 Long term (current) use of oral hypoglycemic drugs: Secondary | ICD-10-CM | POA: Diagnosis not present

## 2017-07-12 DIAGNOSIS — E861 Hypovolemia: Secondary | ICD-10-CM | POA: Diagnosis present

## 2017-07-12 DIAGNOSIS — E7800 Pure hypercholesterolemia, unspecified: Secondary | ICD-10-CM | POA: Diagnosis present

## 2017-07-12 DIAGNOSIS — I4891 Unspecified atrial fibrillation: Secondary | ICD-10-CM | POA: Diagnosis present

## 2017-07-12 DIAGNOSIS — I129 Hypertensive chronic kidney disease with stage 1 through stage 4 chronic kidney disease, or unspecified chronic kidney disease: Secondary | ICD-10-CM | POA: Diagnosis not present

## 2017-07-12 DIAGNOSIS — I2510 Atherosclerotic heart disease of native coronary artery without angina pectoris: Secondary | ICD-10-CM | POA: Diagnosis present

## 2017-07-12 DIAGNOSIS — Y92009 Unspecified place in unspecified non-institutional (private) residence as the place of occurrence of the external cause: Secondary | ICD-10-CM | POA: Diagnosis not present

## 2017-07-12 DIAGNOSIS — R627 Adult failure to thrive: Secondary | ICD-10-CM | POA: Diagnosis not present

## 2017-07-12 DIAGNOSIS — I080 Rheumatic disorders of both mitral and aortic valves: Secondary | ICD-10-CM | POA: Diagnosis not present

## 2017-07-12 DIAGNOSIS — G4733 Obstructive sleep apnea (adult) (pediatric): Secondary | ICD-10-CM | POA: Diagnosis not present

## 2017-07-12 DIAGNOSIS — Z7982 Long term (current) use of aspirin: Secondary | ICD-10-CM | POA: Diagnosis not present

## 2017-07-12 DIAGNOSIS — Z951 Presence of aortocoronary bypass graft: Secondary | ICD-10-CM | POA: Diagnosis not present

## 2017-07-12 DIAGNOSIS — T462X5A Adverse effect of other antidysrhythmic drugs, initial encounter: Secondary | ICD-10-CM | POA: Diagnosis present

## 2017-07-12 DIAGNOSIS — K5900 Constipation, unspecified: Secondary | ICD-10-CM | POA: Diagnosis not present

## 2017-07-12 DIAGNOSIS — Z833 Family history of diabetes mellitus: Secondary | ICD-10-CM | POA: Diagnosis not present

## 2017-07-12 DIAGNOSIS — B370 Candidal stomatitis: Secondary | ICD-10-CM | POA: Diagnosis not present

## 2017-07-12 DIAGNOSIS — E440 Moderate protein-calorie malnutrition: Secondary | ICD-10-CM | POA: Diagnosis not present

## 2017-07-12 DIAGNOSIS — E860 Dehydration: Secondary | ICD-10-CM | POA: Diagnosis not present

## 2017-07-12 DIAGNOSIS — E1165 Type 2 diabetes mellitus with hyperglycemia: Secondary | ICD-10-CM | POA: Diagnosis present

## 2017-07-12 LAB — BASIC METABOLIC PANEL
Anion gap: 15 (ref 5–15)
BUN / CREAT RATIO: 15 (ref 10–24)
BUN: 30 mg/dL — ABNORMAL HIGH (ref 8–27)
BUN: 27 mg/dL — ABNORMAL HIGH (ref 6–20)
CHLORIDE: 108 mmol/L (ref 101–111)
CO2: 28 mmol/L (ref 20–29)
CO2: 23 mmol/L (ref 22–32)
Calcium: 10 mg/dL (ref 8.6–10.2)
Calcium: 9 mg/dL (ref 8.9–10.3)
Chloride: 104 mmol/L (ref 96–106)
Creatinine, Ser: 1.94 mg/dL — ABNORMAL HIGH (ref 0.76–1.27)
Creatinine, Ser: 2 mg/dL — ABNORMAL HIGH (ref 0.61–1.24)
GFR, EST AFRICAN AMERICAN: 39 mL/min/{1.73_m2} — AB (ref >59)
GFR, EST AFRICAN AMERICAN: 37 mL/min — AB (ref >60)
GFR, EST NON AFRICAN AMERICAN: 34 mL/min/{1.73_m2} — AB (ref >59)
GFR, EST NON AFRICAN AMERICAN: 32 mL/min — AB (ref >60)
Glucose, Bld: 126 mg/dL — ABNORMAL HIGH (ref 65–99)
Glucose: 159 mg/dL — ABNORMAL HIGH (ref 65–99)
POTASSIUM: 5.3 mmol/L — AB (ref 3.5–5.2)
POTASSIUM: 3.8 mmol/L (ref 3.5–5.1)
SODIUM: 149 mmol/L — AB (ref 134–144)
Sodium: 146 mmol/L — ABNORMAL HIGH (ref 135–145)

## 2017-07-12 LAB — GLUCOSE, CAPILLARY
Glucose-Capillary: 108 mg/dL — ABNORMAL HIGH (ref 65–99)
Glucose-Capillary: 122 mg/dL — ABNORMAL HIGH (ref 65–99)
Glucose-Capillary: 182 mg/dL — ABNORMAL HIGH (ref 65–99)
Glucose-Capillary: 169 mg/dL — ABNORMAL HIGH (ref 65–99)

## 2017-07-12 LAB — URINALYSIS, ROUTINE W REFLEX MICROSCOPIC
Bacteria, UA: NONE SEEN
Bilirubin Urine: NEGATIVE
GLUCOSE, UA: NEGATIVE mg/dL
Hgb urine dipstick: NEGATIVE
KETONES UR: 20 mg/dL — AB
Leukocytes, UA: NEGATIVE
NITRITE: NEGATIVE
PH: 5 (ref 5.0–8.0)
Protein, ur: 30 mg/dL — AB
SPECIFIC GRAVITY, URINE: 1.019 (ref 1.005–1.030)
Squamous Epithelial / LPF: NONE SEEN

## 2017-07-12 LAB — CBC
HEMATOCRIT: 36.6 % — AB (ref 39.0–52.0)
Hemoglobin: 11.9 g/dL — ABNORMAL LOW (ref 13.0–17.0)
MCH: 29.8 pg (ref 26.0–34.0)
MCHC: 32.5 g/dL (ref 30.0–36.0)
MCV: 91.7 fL (ref 78.0–100.0)
PLATELETS: 399 10*3/uL (ref 150–400)
RBC: 3.99 MIL/uL — AB (ref 4.22–5.81)
RDW: 13.9 % (ref 11.5–15.5)
WBC: 4.5 10*3/uL (ref 4.0–10.5)

## 2017-07-12 LAB — MAGNESIUM: MAGNESIUM: 2.3 mg/dL (ref 1.7–2.4)

## 2017-07-12 LAB — CK: CK TOTAL: 116 U/L (ref 49–397)

## 2017-07-12 MED ORDER — PROMETHAZINE HCL 25 MG/ML IJ SOLN
12.5000 mg | Freq: Four times a day (QID) | INTRAMUSCULAR | Status: DC | PRN
  Administered 2017-07-12: 12.5 mg via INTRAVENOUS
  Filled 2017-07-12: qty 1

## 2017-07-12 MED ORDER — DEXTROSE 5 % IV SOLN
INTRAVENOUS | Status: DC
  Administered 2017-07-12 – 2017-07-13 (×2): via INTRAVENOUS

## 2017-07-12 MED ORDER — METOPROLOL TARTRATE 50 MG PO TABS
50.0000 mg | ORAL_TABLET | Freq: Two times a day (BID) | ORAL | Status: DC
  Administered 2017-07-12 – 2017-07-14 (×4): 50 mg via ORAL
  Filled 2017-07-12 (×5): qty 1

## 2017-07-12 MED ORDER — BISACODYL 10 MG RE SUPP
10.0000 mg | Freq: Once | RECTAL | Status: AC
  Administered 2017-07-12: 10 mg via RECTAL
  Filled 2017-07-12: qty 1

## 2017-07-12 MED ORDER — ATORVASTATIN CALCIUM 80 MG PO TABS
80.0000 mg | ORAL_TABLET | Freq: Every day | ORAL | Status: DC
  Administered 2017-07-12 – 2017-07-13 (×2): 80 mg via ORAL
  Filled 2017-07-12 (×2): qty 1

## 2017-07-12 MED ORDER — FLUCONAZOLE IN SODIUM CHLORIDE 200-0.9 MG/100ML-% IV SOLN
200.0000 mg | INTRAVENOUS | Status: DC
  Administered 2017-07-12 – 2017-07-13 (×2): 200 mg via INTRAVENOUS
  Filled 2017-07-12 (×3): qty 100

## 2017-07-12 MED ORDER — ASPIRIN EC 325 MG PO TBEC
325.0000 mg | DELAYED_RELEASE_TABLET | Freq: Every day | ORAL | Status: DC
  Administered 2017-07-12 – 2017-07-14 (×3): 325 mg via ORAL
  Filled 2017-07-12 (×3): qty 1

## 2017-07-12 NOTE — Progress Notes (Addendum)
PROGRESS NOTE    Antonio Wells  OEU:[id number] DOB: [birth year] DOA: 07/11/2017 PCP: [name], MD   Brief Narrative:  Antonio Wells is a 71 y.o. male with medical history significant of CABG one month ago, CKD, OSA comes in with one month of not eating well, foul taste to food, no appetite and 28 lbs wt loss since his surgery.  He was sent home not eating well.  He reports no fevers, no sob, no chest pain.  No swelling In his legs.  He went to see his pcp and was diagnosed with thrush after a swab was done from his cheek and sent for direct admit for thrush, FTT and dehydration.  Pt reports weakness but no dizziness or syncope with walking.  No n/v/d.  He denies pain with swallowing or any dysphagia.  He has only been drinking liquids.    Assessment & Plan:   Principal Problem:   Thrush Active Problems:   Hypertension   Diabetes type 2, uncontrolled ([place])   Mitral regurgitation and aortic stenosis   OSA on CPAP   S/P CABG x 3   FTT (failure to thrive) in adult   1-Nausea, vomiting;  Last BM 5 days ago.  Treat oral thrush.  Get KUB.  IV fluids.  PRN Reglan.  LFT normal.   2-AKI; prior cr 1.4 to 1.8.  Worse on admission.  Suspect hypovolemia,  IV fluids.  Check renal US, UA.  Strict I and O.   3-Oral thrush;  Continue with nystatin orally and I will add diflucan IV.   4-DM; continue with SSI. Hold oral hypoglycemic agents.   5-hypernatremia; change IV fluids to D 5. Monitor CBG>   6-Recent CABG; 11-30 Resume statins, aspirin, metoprolol.  Left Message to CVTS of patient admission.  Amiodarone was discontinue 4 days ago by PCP and cardiology due to nausea and vomiting.   DVT prophylaxis: heparin  Code Status: full code.  Family Communication: care discussed with patient.  Disposition Plan: to be determine.   Consultants:   CVTS   Procedures:  Renal US.    Antimicrobials:  none   Subjective: He report nausea and vomiting still  present, not eating. . Denies abdominal pain.  Food taste bad. Last BM 5 days ago. Report passing gas.   Objective: Vitals:   07/11/17 1757 07/11/17 2009 07/12/17 0451 07/12/17 0757  BP: 132/86 132/75 122/80 127/80  Pulse: 96 93 96 91  Resp: 16     Temp: 99.1 F (37.3 C) 97.7 F (36.5 C) 98.3 F (36.8 C) 98.5 F (36.9 C)  TempSrc: Oral Oral Oral Oral  SpO2: 96% 97% 96% 96%  Weight: 77.9 kg (171 lb 12.8 oz)  77.2 kg (170 lb 3.2 oz)   Height: 5\' 3"  (1.6 m)       Intake/Output Summary (Last 24 hours) at 07/12/2017 0932 Last data filed at 07/12/2017 0400 Gross per 24 hour  Intake 788.33 ml  Output 1 ml  Net 787.33 ml   Filed Weights   07/11/17 1757 07/12/17 0451  Weight: 77.9 kg (171 lb 12.8 oz) 77.2 kg (170 lb 3.2 oz)    Examination:  General exam: Appears calm and comfortable  Respiratory system: Clear to auscultation. Respiratory effort normal. Cardiovascular system: S1 & S2 heard, RRR. No JVD, murmurs, rubs, gallops or clicks. No pedal edema. Chest incision healing.  Gastrointestinal system: Abdomen is nondistended, soft and nontender. No organomegaly or masses felt. Normal bowel sounds heard. Central nervous system: Alert  and oriented. No focal neurological deficits. Extremities: Symmetric 5 x 5 power. Skin: No rashes, lesions or ulcers    Data Reviewed: I have personally reviewed following labs and imaging studies  CBC: Recent Labs  Lab 07/07/17 1143 07/11/17 1323 07/11/17 1954 07/12/17 0521  WBC 6.3 5.1 5.5 4.5  NEUTROABS  --   --  3.2  --   HGB 12.6* 13.4* 12.8* 11.9*  HCT 37.6* 38.9* 38.3* 36.6*  MCV 90.8 88.6 91.2 91.7  PLT  --   --  448* 878   Basic Metabolic Panel: Recent Labs  Lab 07/07/17 1143 07/11/17 1459 07/11/17 1954 07/12/17 0521  NA 145* 149* 147* 146*  K 4.9 5.3* 4.5 3.8  CL 103 104 106 108  CO2 29 28 29 23   GLUCOSE 173* 159* 137* 126*  BUN 25 30* 29* 27*  CREATININE 1.87* 1.94* 2.19* 2.00*  CALCIUM 9.5 10.0 9.5 9.0    GFR: Estimated Creatinine Clearance: 31.6 mL/min (A) (by C-G formula based on SCr of 2 mg/dL (H)). Liver Function Tests: Recent Labs  Lab 07/11/17 1954  AST 24  ALT 21  ALKPHOS 100  BILITOT 0.8  PROT 7.6  ALBUMIN 4.0   No results for input(s): LIPASE, AMYLASE in the last 168 hours. No results for input(s): AMMONIA in the last 168 hours. Coagulation Profile: No results for input(s): INR, PROTIME in the last 168 hours. Cardiac Enzymes: No results for input(s): CKTOTAL, CKMB, CKMBINDEX, TROPONINI in the last 168 hours. BNP (last 3 results) Recent Labs    04/27/17 1618  PROBNP 46   HbA1C: No results for input(s): HGBA1C in the last 72 hours. CBG: Recent Labs  Lab 07/11/17 2136 07/12/17 0754  GLUCAP 105* 108*   Lipid Profile: No results for input(s): CHOL, HDL, LDLCALC, TRIG, CHOLHDL, LDLDIRECT in the last 72 hours. Thyroid Function Tests: No results for input(s): TSH, T4TOTAL, FREET4, T3FREE, THYROIDAB in the last 72 hours. Anemia Panel: No results for input(s): VITAMINB12, FOLATE, FERRITIN, TIBC, IRON, RETICCTPCT in the last 72 hours. Sepsis Labs: No results for input(s): PROCALCITON, LATICACIDVEN in the last 168 hours.  No results found for this or any previous visit (from the past 240 hour(s)).       Radiology Studies: No results found.      Scheduled Meds: . insulin aspart  0-9 Units Subcutaneous TID WC  . nystatin  5 mL Oral QID   Continuous Infusions:   LOS: 0 days    Time spent: 35 minutes.     Elmarie Shiley, MD [facility] Pager [phone]  If 7PM-7AM, please contact night-coverage [email or web address] Password [facility] 07/12/2017, 9:32 AM

## 2017-07-12 NOTE — Progress Notes (Signed)
Patient did not received his medications PO due to episode of nausea which he was given phenergan via IV.

## 2017-07-12 NOTE — Progress Notes (Signed)
Patient felt some relief after phenergan was given.  Will continue to monitor patient.

## 2017-07-13 LAB — BASIC METABOLIC PANEL
Anion gap: 10 (ref 5–15)
BUN: 17 mg/dL (ref 6–20)
CALCIUM: 8.7 mg/dL — AB (ref 8.9–10.3)
CHLORIDE: 104 mmol/L (ref 101–111)
CO2: 27 mmol/L (ref 22–32)
CREATININE: 1.69 mg/dL — AB (ref 0.61–1.24)
GFR calc Af Amer: 46 mL/min — ABNORMAL LOW (ref >60)
GFR calc non Af Amer: 39 mL/min — ABNORMAL LOW (ref >60)
GLUCOSE: 192 mg/dL — AB (ref 65–99)
Potassium: 3.2 mmol/L — ABNORMAL LOW (ref 3.5–5.1)
Sodium: 141 mmol/L (ref 135–145)

## 2017-07-13 LAB — CBC
HEMATOCRIT: 33.3 % — AB (ref 39.0–52.0)
HEMOGLOBIN: 10.9 g/dL — AB (ref 13.0–17.0)
MCH: 29.9 pg (ref 26.0–34.0)
MCHC: 32.7 g/dL (ref 30.0–36.0)
MCV: 91.5 fL (ref 78.0–100.0)
Platelets: 328 10*3/uL (ref 150–400)
RBC: 3.64 MIL/uL — ABNORMAL LOW (ref 4.22–5.81)
RDW: 13.5 % (ref 11.5–15.5)
WBC: 4.1 10*3/uL (ref 4.0–10.5)

## 2017-07-13 LAB — GLUCOSE, CAPILLARY
Glucose-Capillary: 182 mg/dL — ABNORMAL HIGH (ref 65–99)
Glucose-Capillary: 116 mg/dL — ABNORMAL HIGH (ref 65–99)
Glucose-Capillary: 139 mg/dL — ABNORMAL HIGH (ref 65–99)
Glucose-Capillary: 118 mg/dL — ABNORMAL HIGH (ref 65–99)

## 2017-07-13 LAB — LIPASE, BLOOD: LIPASE: 57 U/L — AB (ref 11–51)

## 2017-07-13 MED ORDER — POTASSIUM CHLORIDE 20 MEQ/15ML (10%) PO SOLN
30.0000 meq | Freq: Once | ORAL | Status: AC
  Administered 2017-07-13: 30 meq via ORAL
  Filled 2017-07-13: qty 30

## 2017-07-13 MED ORDER — GLUCERNA SHAKE PO LIQD
237.0000 mL | Freq: Two times a day (BID) | ORAL | Status: DC
  Administered 2017-07-13 (×2): 237 mL via ORAL

## 2017-07-13 MED ORDER — SODIUM CHLORIDE 0.9 % IV SOLN
INTRAVENOUS | Status: DC
  Administered 2017-07-13 – 2017-07-14 (×2): via INTRAVENOUS

## 2017-07-13 NOTE — Progress Notes (Signed)
Note from CVTS reviewed. Patient admitted with N/V and FTT likely related to Amio.  He is in NSR at this time. Agree with stopping Amio to see if sx improve.  Would watch for now on tele.  If no recurrent afib could consider sending home on 30 day heart monitor to look for silent afib. Will follow from a distance.

## 2017-07-13 NOTE — Progress Notes (Addendum)
      [address]       [place] [address]             [phone]      Subjective:  Patient S/P CABG performed on 11/30, discharged on 12/4. Since discharge patient has had issues with nausea/vomiting/ and poor oral intake.  He was a direct admit from his PCP office for thrush and FTT.  Currently he states he is doing okay.  He has some mild nausea which is responding to zofran.  He denies chest pain, shortness of breath.   Objective: Vital signs in last 24 hours: Temp:  [97.9 F (36.6 C)-99 F (37.2 C)] 97.9 F (36.6 C) (12/19 0757) Pulse Rate:  [75-99] 82 (12/19 0757) Cardiac Rhythm: Normal sinus rhythm (12/18 2208) BP: (124-132)/(75-80) 124/80 (12/19 0757) SpO2:  [96 %-99 %] 97 % (12/19 0757) Weight:  [173 lb 8 oz (78.7 kg)] 173 lb 8 oz (78.7 kg) (12/19 0601)  Intake/Output from previous day: 12/18 0701 - 12/19 0700 In: 2776.7 [P.O.:660; I.V.:2016.7; IV Piggyback:100] Out: -   General appearance: alert, cooperative and no distress Heart: regular rate and rhythm Lungs: clear to auscultation bilaterally Abdomen: soft, non-tender; bowel sounds normal; no masses,  no organomegaly Wound: clean and dry  Lab Results: Recent Labs    07/12/17 0521 07/13/17 0305  WBC 4.5 4.1  HGB 11.9* 10.9*  HCT 36.6* 33.3*  PLT 399 328   BMET:  Recent Labs    07/12/17 0521 07/13/17 0305  NA 146* 141  K 3.8 3.2*  CL 108 104  CO2 23 27  GLUCOSE 126* 192*  BUN 27* 17  CREATININE 2.00* 1.69*  CALCIUM 9.0 8.7*    PT/INR: No results for input(s): LABPROT, INR in the last 72 hours. ABG    Component Value Date/Time   PHART 7.361 06/24/2017 1919   HCO3 26.2 06/24/2017 1919   TCO2 24 06/25/2017 1717   O2SAT 94.0 06/24/2017 1919   CBG (last 3)  Recent Labs    07/12/17 1642 07/12/17 2108 07/13/17 0754  GLUCAP 182* 169* 182*    Assessment/Plan:  1. CV- NSR, has post operative Atrial Fibrillation and was discharged home on Amiodarone.  This is likely the  source of persistent nausea.  Agree with stopping Amiodarone, continue Lopressor and Statin 2. Pulm- no acute issues, continue IS 3. Renal- creatinine at 1.69, he has baseline creatinine elevation, he is hypokalemic, will need potassium supplementation, which was ordered 4. Oral Thrush- on diflucan 5. Dm- sugars are elevated, ideally less than 150 would be goal 6. Dispo- patient stable, nausea should improve with cessation of Amiodarone, incisions are healing well without evidence on infection, will d/c chest tube sutures, care per medicine   LOS: 1 day    Erin Barrett 07/13/2017  Wounds healing I have seen and examined [name] and agree with the above assessment  and plan.  [name] MD Beeper [phone] Office [phone] 07/13/2017 5:51 PM

## 2017-07-13 NOTE — Progress Notes (Signed)
Initial Nutrition Assessment  DOCUMENTATION CODES:   Non-severe (moderate) malnutrition in context of acute illness/injury, Obesity unspecified  INTERVENTION:    Glucerna Shake po BID, each supplement provides 220 kcal and 10 grams of protein  NUTRITION DIAGNOSIS:   Severe Malnutrition related to acute illness(recent CABG (06/24/17)) as evidenced by energy intake < or equal to 50% for > or equal to 5 days, percent weight loss(12% weight loss within one month).  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   Patient reports minimal intake for the past month since recent heart surgery. He has had no appetite and altered taste. Since admission, he has been consuming 0-50% of meals.   NUTRITION - FOCUSED PHYSICAL EXAM:    Most Recent Value  Orbital Region  No depletion  Upper Arm Region  Mild depletion  Thoracic and Lumbar Region  No depletion  Buccal Region  No depletion  Temple Region  No depletion  Clavicle Bone Region  Mild depletion  Clavicle and Acromion Bone Region  No depletion  Scapular Bone Region  No depletion  Dorsal Hand  No depletion  Patellar Region  Mild depletion  Anterior Thigh Region  No depletion  Posterior Calf Region  Mild depletion  Edema (RD Assessment)  None  Hair  Reviewed  Eyes  Reviewed  Mouth  Reviewed  Skin  Reviewed  Nails  Reviewed       Diet Order:  Diet full liquid Room service appropriate? Yes; Fluid consistency: Thin  EDUCATION NEEDS:   Education needs have been addressed  Skin:  Skin Assessment: Skin Integrity Issues: Skin Integrity Issues:: Incisions Incisions: sternal, groin, leg  Last BM:  12/18  Height:   Ht Readings from Last 1  Encounters:  07/11/17 5\' 3"  (1.6 m)    Weight:   Wt Readings from Last 1 Encounters:  07/13/17 173 lb 8 oz (78.7 kg)    Ideal Body Weight:  56.4 kg  BMI:  Body mass index is 30.73 kg/m.  Estimated Nutritional Needs:   Kcal:  1800-2000  Protein:  85-100 gm  Fluid:  1.8-2 L   Molli Barrows, RD, LDN, CNSC Pager [phone] After Hours Pager [phone]

## 2017-07-13 NOTE — Progress Notes (Signed)
PROGRESS NOTE    Antonio Wells  RUE:[id number] DOB: 1946/03/14 DOA: 07/11/2017 PCP: [name], MD   Brief Narrative:  Antonio Wells is a 71 y.o. male with medical history significant of CABG one month ago, CKD, OSA comes in with one month of not eating well, foul taste to food, no appetite and 28 lbs wt loss since his surgery.  He was sent home not eating well.  He reports no fevers, no sob, no chest pain.  No swelling In his legs.  He went to see his pcp and was diagnosed with thrush after a swab was done from his cheek and sent for direct admit for thrush, FTT and dehydration.  Pt reports weakness but no dizziness or syncope with walking.  No n/v/d.  He denies pain with swallowing or any dysphagia.  He has only been drinking liquids.    Assessment & Plan:   Principal Problem:   Thrush Active Problems:   Hypertension   Diabetes type 2, uncontrolled ([place])   Mitral regurgitation and aortic stenosis   OSA on CPAP   S/P CABG x 3   FTT (failure to thrive) in adult   1-Nausea, vomiting;  Treat oral thrush.  KUB; mild stool burden, no evidence of obstruction.  IV fluids.  PRN Reglan.  LFT normal. Check lipase, EKG  2-AKI; prior cr 1.4 to 1.8.  Worse on admission. Peak to 2 Suspect hypovolemia,  IV fluids.  Renal US negative for obstruction.  Renal function improving.   3-Oral thrush;  Continue with nystatin orally and diflucan IV.   4-DM; continue with SSI. Hold oral hypoglycemic agents.   5-hypernatremia; change IV fluids to D 5. Monitor CBG>  Resolved. Change fluids back to NS  6-Recent CABG; 11-30 Resume statins, aspirin, metoprolol.  Amiodarone was discontinue 4 days ago by PCP and cardiology due to nausea and vomiting.  Dr Reece Packer informed of patient admission.  Cardiology also consulted per CVTS request.  Will defer to cardiology, CVTS ordering ECHO.   DVT prophylaxis: heparin  Code Status: full code.  Family Communication: care discussed with  patient.  Disposition Plan: to be determine.   Consultants:   CVTS   Procedures:  Renal US.    Antimicrobials:  none   Subjective: He is feeling better. He relates nausea has improved. Not eating a lot. Only ate some soup.  Denies dyspnea or chest pain.   Objective: Vitals:   07/12/17 2100 07/12/17 2349 07/13/17 0601 07/13/17 0757  BP: 131/75 132/77 125/75 124/80  Pulse: 76 79 84 82  Resp:      Temp: 98.6 F (37 C) 98.7 F (37.1 C) 98 F (36.7 C) 97.9 F (36.6 C)  TempSrc: Oral Oral Oral Oral  SpO2: 99% 98% 99% 97%  Weight:   78.7 kg (173 lb 8 oz)   Height:        Intake/Output Summary (Last 24 hours) at 07/13/2017 0957 Last data filed at 07/13/2017 0606 Gross per 24 hour  Intake 2776.66 ml  Output -  Net 2776.66 ml   Filed Weights   07/11/17 1757 07/12/17 0451 07/13/17 0601  Weight: 77.9 kg (171 lb 12.8 oz) 77.2 kg (170 lb 3.2 oz) 78.7 kg (173 lb 8 oz)    Examination:  General exam: NAD Respiratory system: CTA Cardiovascular system:  S 1, S 2 RRR, Chest incision healing.  Gastrointestinal system: BS present, soft, nt Central nervous system: non focal.  Extremities: symmetric power.  Skin: No rashes, lesions or  ulcers    Data Reviewed: I have personally reviewed following labs and imaging studies  CBC: Recent Labs  Lab 07/07/17 1143 07/11/17 1323 07/11/17 1954 07/12/17 0521 07/13/17 0305  WBC 6.3 5.1 5.5 4.5 4.1  NEUTROABS  --   --  3.2  --   --   HGB 12.6* 13.4* 12.8* 11.9* 10.9*  HCT 37.6* 38.9* 38.3* 36.6* 33.3*  MCV 90.8 88.6 91.2 91.7 91.5  PLT  --   --  448* 399 062   Basic Metabolic Panel: Recent Labs  Lab 07/07/17 1143 07/11/17 1459 07/11/17 1954 07/12/17 0521 07/12/17 1137 07/13/17 0305  NA 145* 149* 147* 146*  --  141  K 4.9 5.3* 4.5 3.8  --  3.2*  CL 103 104 106 108  --  104  CO2 29 28 29 23   --  27  GLUCOSE 173* 159* 137* 126*  --  192*  BUN 25 30* 29* 27*  --  17  CREATININE 1.87* 1.94* 2.19* 2.00*  --  1.69*    CALCIUM 9.5 10.0 9.5 9.0  --  8.7*  MG  --   --   --   --  2.3  --    GFR: Estimated Creatinine Clearance: 37.7 mL/min (A) (by C-G formula based on SCr of 1.69 mg/dL (H)). Liver Function Tests: Recent Labs  Lab 07/11/17 1954  AST 24  ALT 21  ALKPHOS 100  BILITOT 0.8  PROT 7.6  ALBUMIN 4.0   No results for input(s): LIPASE, AMYLASE in the last 168 hours. No results for input(s): AMMONIA in the last 168 hours. Coagulation Profile: No results for input(s): INR, PROTIME in the last 168 hours. Cardiac Enzymes: Recent Labs  Lab 07/12/17 1137  CKTOTAL 116   BNP (last 3 results) Recent Labs    04/27/17 1618  PROBNP 46   HbA1C: No results for input(s): HGBA1C in the last 72 hours. CBG: Recent Labs  Lab 07/12/17 0754 07/12/17 1121 07/12/17 1642 07/12/17 2108 07/13/17 0754  GLUCAP 108* 122* 182* 169* 182*   Lipid Profile: No results for input(s): CHOL, HDL, LDLCALC, TRIG, CHOLHDL, LDLDIRECT in the last 72 hours. Thyroid Function Tests: No results for input(s): TSH, T4TOTAL, FREET4, T3FREE, THYROIDAB in the last 72 hours. Anemia Panel: No results for input(s): VITAMINB12, FOLATE, FERRITIN, TIBC, IRON, RETICCTPCT in the last 72 hours. Sepsis Labs: No results for input(s): PROCALCITON, LATICACIDVEN in the last 168 hours.  No results found for this or any previous visit (from the past 240 hour(s)).       Radiology Studies: Dg Abd 1 View  Result Date: 07/12/2017 CLINICAL DATA:  71 year old male with a history of constipation EXAM: ABDOMEN - 1 VIEW COMPARISON:  07/07/2017 FINDINGS: Gas within stomach, small bowel, colon.  No abnormal distention. Mild stool burden with formed stool within right colon, splenic flexure, descending colon. No radiopaque foreign body. No unexpected calcification or soft tissue density. No displaced fracture IMPRESSION: Mild stool burden without evidence of obstruction. Electronically Signed   By: Corrie Mckusick D.O.   On: 07/12/2017 10:41    US Renal  Result Date: 07/12/2017 CLINICAL DATA:  Chronic kidney disease.  Acute kidney injury. EXAM: RENAL / URINARY TRACT ULTRASOUND COMPLETE COMPARISON:  02/20/2014 FINDINGS: Right Kidney: Length: 10.2 cm. Echogenicity within normal limits. No mass or hydronephrosis visualized. Left Kidney: Length: 13.6 cm. 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 3.8 cm lower pole cyst. 8 mm midportion cysts. Echogenicity within normal limits. No mass or hydronephrosis visualized. Bladder:  Appears normal for degree of bladder distention. Incidental note is made of increased echogenicity of the liver consistent with fatty change. The prostate gland measures 4 x 3.5 x 2.9 cm. IMPRESSION: No evidence of urinary tract obstruction. I think the kidneys are within normal limits in size and echogenicity. 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 Electronically Signed   By: Nelson Chimes M.D.   On: 07/12/2017 10:37        Scheduled Meds: . aspirin  325 mg Oral Daily  . atorvastatin  80 mg Oral q1800  . insulin aspart  0-9 Units Subcutaneous TID WC  . metoprolol tartrate  50 mg Oral BID  . nystatin  5 mL Oral QID   Continuous Infusions: . sodium chloride    . fluconazole (DIFLUCAN) IV Stopped (07/12/17 1248)     LOS: 1 day    Time spent: 35 minutes.     Elmarie Shiley, MD [facility] Pager [phone]  If 7PM-7AM, please contact night-coverage [email or web address] Password [email or web address] 07/13/2017, 9:57 AM

## 2017-07-13 NOTE — Progress Notes (Signed)
Patient's K level at 3.2, sent MD a text to notify.

## 2017-07-14 ENCOUNTER — Other Ambulatory Visit: Payer: Self-pay | Admitting: Cardiology

## 2017-07-14 DIAGNOSIS — E440 Moderate protein-calorie malnutrition: Secondary | ICD-10-CM

## 2017-07-14 DIAGNOSIS — B370 Candidal stomatitis: Secondary | ICD-10-CM

## 2017-07-14 DIAGNOSIS — I480 Paroxysmal atrial fibrillation: Secondary | ICD-10-CM

## 2017-07-14 LAB — BASIC METABOLIC PANEL
ANION GAP: 6 (ref 5–15)
BUN: 11 mg/dL (ref 6–20)
CALCIUM: 8.5 mg/dL — AB (ref 8.9–10.3)
CO2: 28 mmol/L (ref 22–32)
Chloride: 107 mmol/L (ref 101–111)
Creatinine, Ser: 1.65 mg/dL — ABNORMAL HIGH (ref 0.61–1.24)
GFR, EST AFRICAN AMERICAN: 47 mL/min — AB (ref >60)
GFR, EST NON AFRICAN AMERICAN: 40 mL/min — AB (ref >60)
Glucose, Bld: 125 mg/dL — ABNORMAL HIGH (ref 65–99)
POTASSIUM: 4 mmol/L (ref 3.5–5.1)
Sodium: 141 mmol/L (ref 135–145)

## 2017-07-14 LAB — CBC
HEMATOCRIT: 31.7 % — AB (ref 39.0–52.0)
Hemoglobin: 10.4 g/dL — ABNORMAL LOW (ref 13.0–17.0)
MCH: 30.2 pg (ref 26.0–34.0)
MCHC: 32.8 g/dL (ref 30.0–36.0)
MCV: 92.2 fL (ref 78.0–100.0)
PLATELETS: 269 10*3/uL (ref 150–400)
RBC: 3.44 MIL/uL — AB (ref 4.22–5.81)
RDW: 13.7 % (ref 11.5–15.5)
WBC: 3.6 10*3/uL — AB (ref 4.0–10.5)

## 2017-07-14 LAB — GLUCOSE, CAPILLARY
Glucose-Capillary: 113 mg/dL — ABNORMAL HIGH (ref 65–99)
Glucose-Capillary: 118 mg/dL — ABNORMAL HIGH (ref 65–99)

## 2017-07-14 MED ORDER — ONDANSETRON HCL 4 MG PO TABS: 4.0000 mg | ORAL_TABLET | Freq: Four times a day (QID) | ORAL | 0 refills | Status: DC

## 2017-07-14 MED ORDER — GLUCERNA SHAKE PO LIQD: 237.0000 mL | Freq: Two times a day (BID) | ORAL | 0 refills | Status: DC

## 2017-07-14 MED ORDER — NYSTATIN 100000 UNIT/ML MT SUSP
5.0000 mL | Freq: Four times a day (QID) | OROMUCOSAL | Status: DC
  Administered 2017-07-14 (×2): 500000 [IU] via ORAL
  Filled 2017-07-14 (×2): qty 5

## 2017-07-14 MED ORDER — NYSTATIN 100000 UNIT/ML MT SUSP: 5.0000 mL | Freq: Four times a day (QID) | OROMUCOSAL | 0 refills | Status: DC

## 2017-07-14 MED ORDER — FLUCONAZOLE 200 MG PO TABS
200.0000 mg | ORAL_TABLET | Freq: Every day | ORAL | Status: DC
  Administered 2017-07-14: 200 mg via ORAL
  Filled 2017-07-14: qty 1

## 2017-07-14 MED ORDER — FLUCONAZOLE 200 MG PO TABS: 200.0000 mg | ORAL_TABLET | Freq: Every day | ORAL | 0 refills | Status: DC

## 2017-07-14 NOTE — Discharge Summary (Signed)
Physician Discharge Summary  Antonio Wells PNT:[id number] DOB: Oct 20, 1945 DOA: 07/11/2017  PCP: [name], MD  Admit date: 07/11/2017 Discharge date: 07/14/2017  Admitted From: Home  Disposition:  Home  Recommendations for Outpatient Follow-up:  1. Follow up with PCP in 1-2 weeks 2. Please obtain BMP/CBC in one week 3.   Home Health: no  Discharge Condition: stable.  CODE STATUS: full code.  Diet recommendation: Heart Healthy   Brief/Interim Summary: Antonio [place] Hollowayis a 71 y.o.malewith medical history significant ofCABG one month ago, CKD, OSA comes in with one month of not eating well, foul taste to food, no appetite and 28 lbs wt loss since his surgery. He was sent home not eating well. He reports no fevers, no sob, no chest pain. No swelling In his legs. He went to see his pcp and was diagnosed with thrush after a swab was done from his cheek and sent for direct admit for thrush, FTT and dehydration. Pt reports weakness but no dizziness or syncope with walking. No n/v/d. He denies pain with swallowing or any dysphagia. He has only been drinking liquids.    Assessment & Plan:   Principal Problem:   Thrush Active Problems:   Hypertension   Diabetes type 2, uncontrolled ([place])   Mitral regurgitation and aortic stenosis   OSA on CPAP   S/P CABG x 3   FTT (failure to thrive) in adult   1-Nausea, vomiting; improved. Discharge today if tolerates diet  Treat oral thrush.  KUB; mild stool burden, no evidence of obstruction.  IV fluids.  PRN Reglan.  LFT normal. lipase not significantly elevated.   2-AKI; prior cr 1.4 to 1.8.  Worse on admission. Peak to 2 Suspect hypovolemia,  IV fluids.  Renal US negative for obstruction.  Renal function improving.   3-Oral thrush;  Continue with nystatin orally and diflucan.  Discharge on oral diflucan.   4-DM; continue with SSI. Hold oral hypoglycemic agents. Hold oral medication at discharge,  patient has not been eating. cbg in the 110  5-hypernatremia; change IV fluids to D 5. Monitor CBG>  Resolved. Change fluids back to NS  6-Recent CABG; 11-30 Resume statins, aspirin, metoprolol.  Amiodarone was discontinue 4 days ago by PCP and cardiology due to nausea and vomiting.  Dr Reece Packer informed of patient admission.  Cardiology also consulted per CVTS request.   Afib ; post sx. Amiodarone discontinue due to nausea. Plan to discharge on holter.    Discharge Diagnoses:  Principal Problem:   Thrush Active Problems:   Hypertension   Diabetes type 2, uncontrolled ([facility])   Mitral regurgitation and aortic stenosis   OSA on CPAP   S/P CABG x 3   FTT (failure to thrive) in adult   Malnutrition of moderate degree    Discharge Instructions  Discharge Instructions    Diet - low sodium heart healthy   Complete by:  As directed    Increase activity slowly   Complete by:  As directed      Allergies as of 07/14/2017      Reactions   Penicillins Other (See Comments)   Has patient had a PCN reaction causing immediate rash, facial/tongue/throat swelling, SOB or lightheadedness with hypotension: No Has patient had a PCN reaction causing severe rash involving mucus membranes or skin necrosis: No Has patient had a PCN reaction that required hospitalization: No Has patient had a PCN reaction occurring within the last 10 years: No If all of the above answers are "NO",  then may proceed with Cephalosporin use. Passed out ? SYNCOPE ?   Lisinopril Cough   Amiodarone Nausea And Vomiting   Oxycodone Other (See Comments)   hallucinations   Zofran [ondansetron Hcl]    Has an interaction with another medication pt takes    Cortisone Nausea And Vomiting, Other (See Comments)   HICCUPS   Tramadol Nausea And Vomiting, Other (See Comments)   HICCUPS      Medication List    STOP taking these medications   clotrimazole 10 MG troche Commonly known as:  MYCELEX   furosemide 40 MG  tablet Commonly known as:  LASIX   glipiZIDE 10 MG tablet Commonly known as:  GLUCOTROL   metFORMIN 500 MG tablet Commonly known as:  GLUCOPHAGE     TAKE these medications   amLODipine 10 MG tablet Commonly known as:  NORVASC Take 1 tablet (10 mg total) by mouth daily.   aspirin 325 MG EC tablet Take 1 tablet (325 mg total) by mouth daily.   atorvastatin 80 MG tablet Commonly known as:  LIPITOR Take 1 tablet (80 mg total) by mouth daily at 6 PM. What changed:  when to take this   colchicine 0.6 MG tablet Take 1-2 tablets (0.6-1.2 mg total) by mouth daily as needed (for gout flare). 2 po at onset of flair and then repeat in 1 h for acute gout attack then 1 po qd until pain free   feeding supplement (GLUCERNA SHAKE) Liqd Take 237 mLs by mouth 2 (two) times daily between meals.   fluconazole 200 MG tablet Commonly known as:  DIFLUCAN Take 1 tablet (200 mg total) by mouth daily.   metoprolol tartrate 50 MG tablet Commonly known as:  LOPRESSOR Take 1 tablet (50 mg total) by mouth 2 (two) times daily.   nystatin 100000 UNIT/ML suspension Commonly known as:  MYCOSTATIN Take 5 mLs (500,000 Units total) by mouth 4 (four) times daily.   ondansetron 4 MG tablet Commonly known as:  ZOFRAN Take 1 tablet (4 mg total) by mouth every 6 (six) hours.       Allergies  Allergen Reactions  . Penicillins Other (See Comments)    Has patient had a PCN reaction causing immediate rash, facial/tongue/throat swelling, SOB or lightheadedness with hypotension: No Has patient had a PCN reaction causing severe rash involving mucus membranes or skin necrosis: No Has patient had a PCN reaction that required hospitalization: No Has patient had a PCN reaction occurring within the last 10 years: No If all of the above answers are "NO", then may proceed with Cephalosporin use.   Passed out ? SYNCOPE ?  Marland Kitchen Lisinopril Cough  . Amiodarone Nausea And Vomiting  . Oxycodone Other (See Comments)     hallucinations  . Zofran [Ondansetron Hcl]     Has an interaction with another medication pt takes   . Cortisone Nausea And Vomiting and Other (See Comments)    HICCUPS  . Tramadol Nausea And Vomiting and Other (See Comments)    HICCUPS    Consultations:  CVTS   Procedures/Studies: Dg Chest 2 View  Result Date: 06/20/2017 CLINICAL DATA:  Preoperative examination for CABG. EXAM: CHEST  2 VIEW COMPARISON:  Chest x-ray dated February 03, 2017. FINDINGS: The cardiomediastinal silhouette is normal in size. Normal pulmonary vascularity. No focal consolidation, pleural effusion, or pneumothorax. No acute osseous abnormality. IMPRESSION: No active cardiopulmonary disease. Electronically Signed   By: Titus Dubin M.D.   On: 06/20/2017 16:50   Dg Abd 1  View  Result Date: 07/12/2017 CLINICAL DATA:  71 year old male with a history of constipation EXAM: ABDOMEN - 1 VIEW COMPARISON:  07/07/2017 FINDINGS: Gas within stomach, small bowel, colon.  No abnormal distention. Mild stool burden with formed stool within right colon, splenic flexure, descending colon. No radiopaque foreign body. No unexpected calcification or soft tissue density. No displaced fracture IMPRESSION: Mild stool burden without evidence of obstruction. Electronically Signed   By: Corrie Mckusick D.O.   On: 07/12/2017 10:41   US Renal  Result Date: 07/12/2017 CLINICAL DATA:  Chronic kidney disease.  Acute kidney injury. EXAM: RENAL / URINARY TRACT ULTRASOUND COMPLETE COMPARISON:  02/20/2014 FINDINGS: Right Kidney: Length: 10.2 cm. Echogenicity within normal limits. No mass or hydronephrosis visualized. Left Kidney: Length: 13.6 cm. 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 3.8 cm lower pole cyst. 8 mm midportion cysts. Echogenicity within normal limits. No mass or hydronephrosis visualized. Bladder: Appears normal for degree of bladder distention. Incidental note is made of increased echogenicity of the liver consistent with  fatty change. The prostate gland measures 4 x 3.5 x 2.9 cm. IMPRESSION: No evidence of urinary tract obstruction. I think the kidneys are within normal limits in size and echogenicity. 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 Electronically Signed   By: Nelson Chimes M.D.   On: 07/12/2017 10:37   Dg Chest Port 1 View  Result Date: 06/27/2017 CLINICAL DATA:  Status post CABG, chest tube removal. EXAM: PORTABLE CHEST 1 VIEW COMPARISON:  Portable chest x-ray of June 26, 2017 FINDINGS: There has been interval removal of the left chest tube. The left lung is well-expanded. There remain coarse retrocardiac lung markings medially on the left. There is no pneumothorax or significant pleural effusion. The right lung is mildly hypoinflated with minimal infrahilar density. The cardiac silhouette is enlarged. The pulmonary vascularity is normal. The sternal wires are intact. TheCordis sheath tip projects over the distal aspect of the right internal jugular vein. Eighteen 10 the midportion of the catheter remains visible. IMPRESSION: Mild bibasilar atelectasis, no pneumothorax or significant pleural effusion since chest tube removal. No pulmonary edema. Electronically Signed   By: David  Martinique M.D.   On: 06/27/2017 07:29   Dg Chest Port 1 View  Result Date: 06/26/2017 CLINICAL DATA:  Chest tube EXAM: PORTABLE CHEST 1 VIEW COMPARISON:  06/25/2017 FINDINGS: Interval removal of Swan-Ganz catheter. Left chest tube remains in place. No pneumothorax. Cardiomegaly with vascular congestion and bibasilar atelectasis. IMPRESSION: Cardiomegaly, vascular congestion and bibasilar atelectasis, similar to prior study. Electronically Signed   By: Rolm Baptise M.D.   On: 06/26/2017 07:16   Dg Chest Port 1 View  Result Date: 06/25/2017 CLINICAL DATA:  Post CABG EXAM: PORTABLE CHEST 1 VIEW COMPARISON:  06/24/2017 FINDINGS: Interval extubation and removal of NG tube. Swan-Ganz catheter  appears to coil in the right ventricle. Left chest tube in place without pneumothorax. Changes of CABG. Cardiomegaly. Low lung volumes with bibasilar atelectasis. IMPRESSION: Swan-Ganz catheter coils in the right ventricle. Cardiomegaly, vascular congestion and bibasilar atelectasis. Electronically Signed   By: Rolm Baptise M.D.   On: 06/25/2017 07:42   Dg Chest Port 1 View  Result Date: 06/24/2017 CLINICAL DATA:  Status post CABG today. EXAM: PORTABLE CHEST 1 VIEW COMPARISON:  PA and lateral chest 06/20/2017. FINDINGS: Endotracheal tube is in place with tip in good position just below the clavicular heads. Right IJ approach Swan-Ganz catheter tip is looped in the proximal pulmonary outflow tract with the  tip projecting retrograde. Provided history indicates the catheter has been pulled back. Mediastinal drain and left chest tube are noted. Mild bibasilar atelectasis. No edema or pneumothorax. IMPRESSION: Support tubes and lines as described above. Negative for pneumothorax with a left chest tube in place. As noted above, parotid history indicates the patient Swan-Ganz catheter was repositioned prior to this film's interpretation. Mild left basilar atelectasis. Electronically Signed   By: Inge Rise M.D.   On: 06/24/2017 13:20   Dg Abd Acute W/chest  Result Date: 07/07/2017 CLINICAL DATA:  Nausea and vomiting EXAM: DG ABDOMEN ACUTE W/ 1V CHEST COMPARISON:  Chest radiograph June 27, 2017; abdomen series February 19, 2014 FINDINGS: PA chest: No edema or consolidation. Heart size and pulmonary vascularity are normal. No adenopathy. Patient is status post internal mammary bypass grafting. Supine and upright abdomen: There is diffuse stool throughout the colon. There is no bowel dilatation or air-fluid level to suggest bowel obstruction. No free air. 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 IMPRESSION: Diffuse stool throughout colon. No bowel obstruction or free air. 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  There is no lung edema or consolidation. Electronically Signed   By: Lowella Grip III M.D.   On: 07/07/2017 11:51       Subjective: Report improvement of nausea. Tolerating clear diet   Discharge Exam: Vitals:   07/14/17 1014 07/14/17 1334  BP: 118/62 127/79  Pulse: 75 67  Resp:  (!) 1  Temp:  98.9 F (37.2 C)  SpO2:  99%   Vitals:   07/13/17 2348 07/14/17 0541 07/14/17 1014 07/14/17 1334  BP:  131/63 118/62 127/79  Pulse: 86 74 75 67  Resp:    (!) 1  Temp:  98.7 F (37.1 C)  98.9 F (37.2 C)  TempSrc:  Oral  Oral  SpO2:  99%  99%  Weight:  80.4 kg (177 lb 3.2 oz)    Height:        General: Pt is alert, awake, not in acute distress Cardiovascular: RRR, S1/S2 +, no rubs, no gallops Respiratory: CTA bilaterally, no wheezing, no rhonchi Abdominal: Soft, NT, ND, bowel sounds + Extremities: no edema, no cyanosis    The results of significant diagnostics from this hospitalization (including imaging, microbiology, ancillary and laboratory) are listed below for reference.     Microbiology: No results found for this or any previous visit (from the past 240 hour(s)).   Labs: BNP (last 3 results) No results for input(s): BNP in the last 8760 hours. Basic Metabolic Panel: Recent Labs  Lab 07/11/17 1459 07/11/17 1954 07/12/17 0521 07/12/17 1137 07/13/17 0305 07/14/17 0658  NA 149* 147* 146*  --  141 141  K 5.3* 4.5 3.8  --  3.2* 4.0  CL 104 106 108  --  104 107  CO2 28 29 23   --  27 28  GLUCOSE 159* 137* 126*  --  192* 125*  BUN 30* 29* 27*  --  17 11  CREATININE 1.94* 2.19* 2.00*  --  1.69* 1.65*  CALCIUM 10.0 9.5 9.0  --  8.7* 8.5*  MG  --   --   --  2.3  --   --    Liver Function Tests: Recent Labs  Lab 07/11/17 1954  AST 24  ALT 21  ALKPHOS 100  BILITOT 0.8  PROT 7.6  ALBUMIN 4.0   Recent Labs  Lab 07/13/17 1001  LIPASE 57*   No results for input(s): AMMONIA in the last 168 hours. CBC: Recent Labs  Lab 07/11/17 1323 07/11/17 1954  07/12/17 0521 07/13/17 0305 07/14/17 0658  WBC 5.1 5.5 4.5 4.1 3.6*  NEUTROABS  --  3.2  --   --   --   HGB 13.4* 12.8* 11.9* 10.9* 10.4*  HCT 38.9* 38.3* 36.6* 33.3* 31.7*  MCV 88.6 91.2 91.7 91.5 92.2  PLT  --  448* 399 328 269   Cardiac Enzymes: Recent Labs  Lab 07/12/17 1137  CKTOTAL 116   BNP: Invalid input(s): POCBNP CBG: Recent Labs  Lab 07/13/17 1200 07/13/17 1623 07/13/17 2058 07/14/17 0744 07/14/17 1125  GLUCAP 116* 139* 118* 113* 118*   D-Dimer No results for input(s): DDIMER in the last 72 hours. Hgb A1c No results for input(s): HGBA1C in the last 72 hours. Lipid Profile No results for input(s): CHOL, HDL, LDLCALC, TRIG, CHOLHDL, LDLDIRECT in the last 72 hours. Thyroid function studies No results for input(s): TSH, T4TOTAL, T3FREE, THYROIDAB in the last 72 hours.  Invalid input(s): FREET3 Anemia work up No results for input(s): VITAMINB12, FOLATE, FERRITIN, TIBC, IRON, RETICCTPCT in the last 72 hours. Urinalysis    Component Value Date/Time   COLORURINE YELLOW 07/12/2017 [place] 07/12/2017 1256   LABSPEC 1.019 07/12/2017 1256   PHURINE 5.0 07/12/2017 1256   GLUCOSEU NEGATIVE 07/12/2017 1256   HGBUR NEGATIVE 07/12/2017 [place] 07/12/2017 1256   BILIRUBINUR neg 02/19/2014 1456   KETONESUR 20 (A) 07/12/2017 1256   PROTEINUR 30 (A) 07/12/2017 1256   UROBILINOGEN 0.2 02/20/2014 1039   NITRITE NEGATIVE 07/12/2017 1256   LEUKOCYTESUR NEGATIVE 07/12/2017 1256   Sepsis Labs Invalid input(s): PROCALCITONIN,  WBC,  LACTICIDVEN Microbiology No results found for this or any previous visit (from the past 240 hour(s)).   Time coordinating discharge: Over 30 minutes  SIGNED:   Elmarie Shiley, MD  [facility] 07/14/2017, 4:12 PM Pager [phone]  If 7PM-7AM, please contact night-coverage [email or web address] Password [email or web address]

## 2017-07-14 NOTE — Progress Notes (Signed)
No afib noted overnight.  We will get patient set up for event monitor as outpt to look for silent afib.  Continue metoprolol.  Please call with any issues.

## 2017-07-14 NOTE — Progress Notes (Signed)
Patient received discharge information and acknowledged understanding of it. Patient received prescriptions. RN spoke with cardiology NP on call about heart monitor for patient and was told the office will call patient when there is one available. Patient is aware of that. Patient IV was removed.

## 2017-07-15 ENCOUNTER — Telehealth: Payer: Self-pay

## 2017-07-15 NOTE — Telephone Encounter (Signed)
Transition Care Management Follow-up Telephone Call   Date discharged? 07/14/17   How have you been since you were released from the hospital? Patient is still weak but doing better   Do you understand why you were in the hospital? yes   Do you understand the discharge instructions? yes   Where were you discharged to? home   Items Reviewed:  Medications reviewed: yes  Allergies reviewed: yes  Dietary changes reviewed: yes  Referrals reviewed: yes   Functional Questionnaire:   Activities of Daily Living (ADLs):   He states they are independent in the following: ambulation, bathing and hygiene, feeding, continence, grooming, toileting and dressing States they require assistance with the following: none   Any transportation issues/concerns?: no   Any patient concerns? no   Confirmed importance and date/time of follow-up visits scheduled yes  Provider Appointment booked with Dr. Carlota Raspberry on 07/30/16 @ 10:20 am.  Confirmed with patient if condition begins to worsen call PCP or go to the ER.  Patient was given the office number and encouraged to call back with question or concerns.  : yes

## 2017-07-25 ENCOUNTER — Ambulatory Visit (INDEPENDENT_AMBULATORY_CARE_PROVIDER_SITE_OTHER): Payer: Medicare Other

## 2017-07-25 DIAGNOSIS — I480 Paroxysmal atrial fibrillation: Secondary | ICD-10-CM | POA: Diagnosis not present

## 2017-07-27 ENCOUNTER — Other Ambulatory Visit: Payer: Self-pay | Admitting: Surgery

## 2017-07-27 ENCOUNTER — Ambulatory Visit (INDEPENDENT_AMBULATORY_CARE_PROVIDER_SITE_OTHER): Payer: Self-pay | Admitting: Surgery

## 2017-07-27 ENCOUNTER — Ambulatory Visit
Admission: RE | Admit: 2017-07-27 | Discharge: 2017-07-27 | Disposition: A | Discharge status: Home / Self Care | Payer: Medicare Other | Source: Ambulatory Visit | Attending: Surgery | Admitting: Surgery

## 2017-07-27 ENCOUNTER — Encounter: Payer: Self-pay | Admitting: Surgery

## 2017-07-27 VITALS — BP 126/76 | HR 65 | Resp 20 | Ht 63.0 in | Wt 184.0 lb

## 2017-07-27 DIAGNOSIS — Z951 Presence of aortocoronary bypass graft: Secondary | ICD-10-CM

## 2017-07-27 DIAGNOSIS — I25810 Atherosclerosis of coronary artery bypass graft(s) without angina pectoris: Secondary | ICD-10-CM | POA: Diagnosis not present

## 2017-07-27 DIAGNOSIS — I25118 Atherosclerotic heart disease of native coronary artery with other forms of angina pectoris: Secondary | ICD-10-CM

## 2017-07-27 NOTE — Progress Notes (Signed)
     HPI: Patient returns for routine postoperative follow-up having undergone CABG x 3 on 06/24/2017. The patient's early postoperative recovery while in the hospital was notable for development of postop atrial fibrillation converted with amiodarone. He was discharged in sinus rhythm on amiodarone but developed persistent nausea and anorexia and the amiodarone was stopped. The symptoms resolved. He currently has a 30 day event monitor on. He feels well and is walking without chest pain or shortness of breath.   Current Outpatient Medications  Medication Sig Dispense Refill  . amLODipine (NORVASC) 10 MG tablet Take 1 tablet (10 mg total) by mouth daily. 90 tablet 1  . aspirin EC 325 MG EC tablet Take 1 tablet (325 mg total) by mouth daily. 30 tablet 0  . atorvastatin (LIPITOR) 80 MG tablet Take 1 tablet (80 mg total) by mouth daily at 6 PM. (Patient taking differently: Take 80 mg by mouth every morning. ) 30 tablet 6  . colchicine 0.6 MG tablet Take 1-2 tablets (0.6-1.2 mg total) by mouth daily as needed (for gout flare). 2 po at onset of flair and then repeat in 1 h for acute gout attack then 1 po qd until pain free (Patient not taking: Reported on 07/12/2017) 30 tablet 0  . feeding supplement, GLUCERNA SHAKE, (GLUCERNA SHAKE) LIQD Take 237 mLs by mouth 2 (two) times daily between meals. 30 Can 0  . fluconazole (DIFLUCAN) 200 MG tablet Take 1 tablet (200 mg total) by mouth daily. 7 tablet 0  . metoprolol tartrate (LOPRESSOR) 50 MG tablet Take 1 tablet (50 mg total) by mouth 2 (two) times daily. 180 tablet 1  . nystatin (MYCOSTATIN) 100000 UNIT/ML suspension Take 5 mLs (500,000 Units total) by mouth 4 (four) times daily. 60 mL 0  . ondansetron (ZOFRAN) 4 MG tablet Take 1 tablet (4 mg total) by mouth every 6 (six) hours. 12 tablet 0   No current facility-administered medications for this visit.     Physical Exam: BP 126/76   Pulse 65   Resp 20   Ht 5\' 3"  (1.6 m)   Wt 184 lb (83.5 kg)    SpO2 99% Comment: RA  BMI 32.59 kg/m  He looks well. Lung exam is clear. Cardiac exam shows a regular rate and rhythm with normal heart sounds. Chest incision is healing well and sternum is stable. The leg incisions are healing well and there is no peripheral edema.    Diagnostic Tests:  CLINICAL DATA:  CABG on 06/24/2017.  EXAM: CHEST  2 VIEW  COMPARISON:  07/07/2017 and 06/20/2017  FINDINGS: The heart size and mediastinal contours are within normal limits. Both lungs are clear. No effusions. The visualized skeletal structures are unremarkable. CABG.  IMPRESSION: No active cardiopulmonary disease.   Electronically Signed   By: Lorriane Shire M.D.   On: 07/27/2017 11:52   Impression:  Overall I think he is doing well. I encouraged him to continue walking. He is planning to participate in cardiac rehab. I told him he could drive his car but should not lift anything heavier than 10 lbs for three months postop.   Plan:  He will continue to follow up with cardiology and will return to see me if he has any problems with his incisions.   Gaye Pollack, MD [facility] [phone]

## 2017-07-29 ENCOUNTER — Telehealth (HOSPITAL_COMMUNITY): Payer: Self-pay

## 2017-07-29 NOTE — Telephone Encounter (Signed)
Called and spoke with patient in regards to Cardiac Rehab - Patient is interested in the program. Scheduled orientation on 09/06/2017 at 8:45am. Patient will attend the 9:45am exc class. Patient would like to move to the 1:15pm exc class once available.

## 2017-07-30 ENCOUNTER — Other Ambulatory Visit: Payer: Self-pay

## 2017-07-30 ENCOUNTER — Encounter: Payer: Self-pay | Admitting: Family Medicine

## 2017-07-30 ENCOUNTER — Ambulatory Visit (INDEPENDENT_AMBULATORY_CARE_PROVIDER_SITE_OTHER): Payer: Medicare Other | Admitting: Family Medicine

## 2017-07-30 VITALS — BP 122/68 | HR 81 | Temp 97.8°F | Resp 16 | Ht 63.39 in | Wt 183.0 lb

## 2017-07-30 DIAGNOSIS — R112 Nausea with vomiting, unspecified: Secondary | ICD-10-CM | POA: Diagnosis not present

## 2017-07-30 DIAGNOSIS — I25118 Atherosclerotic heart disease of native coronary artery with other forms of angina pectoris: Secondary | ICD-10-CM

## 2017-07-30 DIAGNOSIS — B370 Candidal stomatitis: Secondary | ICD-10-CM

## 2017-07-30 DIAGNOSIS — D649 Anemia, unspecified: Secondary | ICD-10-CM

## 2017-07-30 DIAGNOSIS — H9201 Otalgia, right ear: Secondary | ICD-10-CM | POA: Diagnosis not present

## 2017-07-30 DIAGNOSIS — N179 Acute kidney failure, unspecified: Secondary | ICD-10-CM | POA: Diagnosis not present

## 2017-07-30 DIAGNOSIS — E11319 Type 2 diabetes mellitus with unspecified diabetic retinopathy without macular edema: Secondary | ICD-10-CM

## 2017-07-30 NOTE — Patient Instructions (Addendum)
  I'm glad to hear you are doing better from the hospital. Continue healthy foods, I will check blood sugar today, but monitor that at home and will likely need to restart some meds. If blood sugar is over 150, then restart metformin twice per day (I will check kidney function again today as well to make sure it is ok to use metformin. Follow up in next 1 month with some home blood sugars to determine if additional medicine needed. Follow up sooner if you are having readings in 200 range.   Continue follow up with cardiology as planned.   I will check blood counts to check status anemia, as well as repeat that next month.   Ear looks okay today.  If that is not improving in next few weeks, difficulty hearing, or worsening - I can refer you to ear, nose and throat specialist.   Return to the clinic or go to the nearest emergency room if any of your symptoms worsen or new symptoms occur.  Thanks for coming in today.   IF you received an x-ray today, you will receive an invoice from [facility]. Please contact [facility] at [phone] with questions or concerns regarding your invoice.   IF you received labwork today, you will receive an invoice from [place]. Please contact [facility] at [phone] with questions or concerns regarding your invoice.   Our billing staff will not be able to assist you with questions regarding bills from these companies.  You will be contacted with the lab results as soon as they are available. The fastest way to get your results is to activate your My Chart account. Instructions are located on the last page of this paperwork. If you have not heard from [place] regarding the results in 2 weeks, please contact this office.

## 2017-07-30 NOTE — Progress Notes (Signed)
Subjective:  By signing my name below, I, Antonio Wells, attest that this documentation has been prepared under the direction and in the presence of Antonio Ray, MD. Electronically Signed: [name], [place]. 07/30/2017 , 11:06 AM .  Patient was seen in Room 11 .   Patient ID: Antonio Wells, male    DOB: [birth year], 72 y.o.   MRN: [id number] Chief Complaint  Patient presents with  . Nausea    follow-up from 12/17, pt states he feels better and doing much better    HPI Antonio Wells is a 72 y.o. male  Here for transition of care visit/hospital follow up.   Nausea and vomiting with failure to thrive / weight loss  Patient was admitted after out last visit on Dec 17th for failure to thrive with persistent nausea, episodic vomiting and weight loss; thought to be due to amiodarone that was initially treating atrial fibrillation after CABG on Nov 30th. He was admitted from Dec 17th through the 20th.   He improved during hospitalization. He was treated with PRN Reglan. He had reassuring LFt's and lipase as well as KUB. He was treated with IV fluids.   Wt Readings from Last 3 Encounters:  07/30/17 183 lb (83 kg)  07/27/17 184 lb (83.5 kg)  07/14/17 177 lb 3.2 oz (80.4 kg)   He states he's not back to 100%, but denies anymore nausea and has been able to hold his food down. He's been cutting back on soft drinks.   Thrush He also noted to have thrush at last visit, that was thought to be contributing to his dysphagia and decreased appetite. Initially detected on Dec 17th, treated with nystatin 18mL QID in the hospital. Initially, I had prescribed mycelex 10mg  troche on Dec 17th visit and was treated with diflucan 200mg  QD. He was discharged with 7 more days treatment on Dec 20th.   AKI/acute kidney injury  He had a bump in his creatinine up to 2.0; thought to be volume depletion. His creatinine returned to 1.65 at discharge; treated with IV fluids.   Diabetes His oral  medications were held due to decreased PO intake. Sliding scale insulin in hospital. His oral medications include metformin 500mg  BID and glipizide 10mg  BID.   Lab Results  Component Value Date   HGBA1C 8.2 (H) 06/20/2017   We had discussed adding additional medications with prior insufficient control but those were cost prohibitive.   He hasn't been checking his sugars recently.   History of CAD/CABG He is s/p CABG, done on Nov 30th. He was seen 3 days ago by his surgeon, Dr. Cyndia Bent. Thought to be doing well with plan for cardiac rehab, which will start next month. He denies irregular heartbeats.   Atrial fibrillation  His amiodarone was stopped as above. He is currently wearing an event monitor. He has an appointment scheduled with cardiology on Feb 8th, continued on metoprolol for rate control and aspirin 325mg  QD.   Anemia Lab Results  Component Value Date   WBC 3.6 (L) 07/14/2017   HGB 10.4 (L) 07/14/2017   HCT 31.7 (L) 07/14/2017   MCV 92.2 07/14/2017   PLT 269 07/14/2017   Appears to have a slight downtrend in hospital from 11.9 to 10.4. Plan for repeat testing today. He denies any dark, tarry stools or black-colored stools.   Right ear pain He mentions slight right ear pain that started after his surgery. When he lays down to sleep, his preference is sleeping on his right  side. He denies any difficulty hearing out of his right ear. He uses q-tips less than once every couple of weeks.   Patient Active Problem List   Diagnosis Date Noted  . Malnutrition of moderate degree 07/14/2017  . Thrush 07/11/2017  . FTT (failure to thrive) in adult 07/11/2017  . 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 CAD (coronary artery disease) 05/25/2017  . Unstable angina ([place])   . Hypertensive heart disease 07/14/2015  . Lower extremity edema 07/14/2015  . Aortic stenosis 01/09/2015  . OSA on CPAP 12/12/2014  . Hypersomnia with sleep apnea 12/12/2014  . Obstructive sleep apnea (adult) (pediatric)  02/04/2014  . 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 Obesity 02/04/2014  . Mitral regurgitation and aortic stenosis 01/02/2014  . DOE (dyspnea on exertion) 11/26/2013  . Gout 08/12/2011  . Hypertension 08/12/2011  . High cholesterol 08/12/2011  . OSA (obstructive sleep apnea) 08/12/2011  . Diabetes type 2, uncontrolled ([place]) 08/12/2011   Past Medical History:  Diagnosis Date  . Arthritis    "knees" (07/11/2017)  . CKD (chronic kidney disease), stage III ([place])   . Coronary artery disease    a.  CABGx3 in 05/2017.  Marland Kitchen Gout   . Heart murmur   . High cholesterol   . Hypertension   . Mild aortic stenosis   . Mild mitral regurgitation   . OSA on CPAP   . Postoperative atrial fibrillation ([place]) 06/2017  . Type II diabetes mellitus ([place])    Past Surgical History:  Procedure Laterality Date  . CARDIAC CATHETERIZATION    . COLONOSCOPY  2008  . CORONARY ARTERY BYPASS GRAFT N/A 06/24/2017   Procedure: CORONARY ARTERY BYPASS GRAFTING (CABG) x 3 ; Using Left Internal Mammary Artery and Right Great Saphenous Leg Vein Harvested Endoscopically;  Surgeon: Gaye Pollack, MD;  Location: [facility];  Service: Open Heart Surgery;  Laterality: N/A;  . EYE SURGERY    . LEFT HEART CATH AND CORONARY ANGIOGRAPHY N/A 05/25/2017   Procedure: LEFT HEART CATH AND CORONARY ANGIOGRAPHY;  Surgeon: Wellington Hampshire, MD;  Location: [facility];  Service: Cardiovascular;  Laterality: N/A;  . TEE WITHOUT CARDIOVERSION N/A 06/24/2017   Procedure: TRANSESOPHAGEAL ECHOCARDIOGRAM (TEE);  Surgeon: Gaye Pollack, MD;  Location: [facility];  Service: Open Heart Surgery;  Laterality: N/A;   Allergies  Allergen Reactions  . Penicillins Other (See Comments)    Has patient had a PCN reaction causing immediate rash, facial/tongue/throat swelling, SOB or lightheadedness with hypotension: No Has patient had a PCN reaction causing severe rash involving mucus membranes or skin necrosis: No Has patient had a PCN reaction that  required hospitalization: No Has patient had a PCN reaction occurring within the last 10 years: No If all of the above answers are "NO", then may proceed with Cephalosporin use.   Passed out ? SYNCOPE ?  Marland Kitchen Lisinopril Cough  . Amiodarone Nausea And Vomiting  . Oxycodone Other (See Comments)    hallucinations  . Zofran [Ondansetron Hcl]     Has an interaction with another medication pt takes   . Cortisone Nausea And Vomiting and Other (See Comments)    HICCUPS  . Tramadol Nausea And Vomiting and Other (See Comments)    HICCUPS   Prior to Admission medications   Medication Sig Start Date End Date Taking? Authorizing Provider  amLODipine (NORVASC) 10 MG tablet Take 1 tablet (10 mg total) by mouth daily. 05/12/17   [name], MD  aspirin EC 325 MG  EC tablet Take 1 tablet (325 mg total) by mouth daily. 06/29/17   Gold, Wayne E, PA-C  atorvastatin (LIPITOR) 80 MG tablet Take 1 tablet (80 mg total) by mouth daily at 6 PM. Patient taking differently: Take 80 mg by mouth every morning.  05/26/17   Bhagat, Crista Luria, PA  colchicine 0.6 MG tablet Take 1-2 tablets (0.6-1.2 mg total) by mouth daily as needed (for gout flare). 2 po at onset of flair and then repeat in 1 h for acute gout attack then 1 po qd until pain free 05/31/13   Harvie Heck, PA-C  feeding supplement, GLUCERNA SHAKE, (GLUCERNA SHAKE) LIQD Take 237 mLs by mouth 2 (two) times daily between meals. 07/14/17   Regalado, Belkys A, MD  fluconazole (DIFLUCAN) 200 MG tablet Take 1 tablet (200 mg total) by mouth daily. 07/14/17   Regalado, Belkys A, MD  metoprolol tartrate (LOPRESSOR) 50 MG tablet Take 1 tablet (50 mg total) by mouth 2 (two) times daily. 05/12/17   [name], MD  nystatin (MYCOSTATIN) 100000 UNIT/ML suspension Take 5 mLs (500,000 Units total) by mouth 4 (four) times daily. 07/14/17   Regalado, Belkys A, MD  ondansetron (ZOFRAN) 4 MG tablet Take 1 tablet (4 mg total) by mouth every 6 (six) hours. 07/14/17    Regalado, Cassie Freer, MD   Social History   Socioeconomic History  . Marital status: Married    Spouse name: [name]  . Number of children: 3  . Years of education: 65  . Highest education level: Not on file  Social Needs  . Financial resource strain: Not on file  . Food insecurity - worry: Not on file  . Food insecurity - inability: Not on file  . Transportation needs - medical: Not on file  . Transportation needs - non-medical: Not on file  Occupational History  . Occupation: Musician  Tobacco Use  . Smoking status: Former Smoker    Packs/day: 2.00    Years: 15.00    Pack years: 30.00    Types: Cigarettes    Last attempt to quit: 07/26/1998    Years since quitting: 19.0  . Smokeless tobacco: Never Used  Substance and Sexual Activity  . Alcohol use: No    Alcohol/week: 0.0 oz  . Drug use: No  . Sexual activity: Yes  Other Topics Concern  . Not on file  Social History Narrative   Patient is married [name]) and lives at home with his wife.   Patient has three adult children.   Patient is retired.   Patient is a Research officer, political party.   Patient is right-handed.   Patient drinks one cup of coffee daily and 2-3 sodas per day.      Review of Systems  Constitutional: Negative for fatigue and unexpected weight change.  HENT: Positive for ear pain (right ear). Negative for hearing loss.   Eyes: Negative for visual disturbance.  Respiratory: Negative for cough, chest tightness and shortness of breath.   Cardiovascular: Negative for chest pain, palpitations and leg swelling.  Gastrointestinal: Negative for abdominal pain and Wells in stool.  Neurological: Negative for dizziness, light-headedness and headaches.       Objective:   Physical Exam  Constitutional: He is oriented to person, place, and time. He appears well-developed and well-nourished.  HENT:  Head: Normocephalic and atraumatic.  Right Ear: Tympanic membrane and external ear normal.  TMJ non tender, no clicking; no  external ear and TM all appear normal  Eyes: EOM are normal. Pupils are  equal, round, and reactive to light.  Neck: No JVD present. Carotid bruit is not present.  Cardiovascular: Normal rate and regular rhythm.  Murmur heard.  Systolic murmur is present with a grade of 1/6. Pulmonary/Chest: Effort normal and breath sounds normal. He has no rales.  Abdominal: Soft. Bowel sounds are normal. He exhibits no distension. There is no tenderness.  Musculoskeletal: He exhibits no edema.  Calves non tender, no swelling  Neurological: He is alert and oriented to person, place, and time.  Skin: Skin is warm and dry.  Psychiatric: He has a normal mood and affect.  Vitals reviewed.   Vitals:   07/30/17 1027  BP: 122/68  Pulse: 81  Resp: 16  Temp: 97.8 F (36.6 C)  TempSrc: Oral  SpO2: 98%  Weight: 183 lb (83 kg)  Height: 5' 3.39" (1.61 m)      Assessment & Plan:  Antonio Wells is a 72 y.o. male AKI (acute kidney injury) ([place]) - Plan: Basic metabolic panel  - Recheck BMP, suspect improvement in creatinine after hydration in hospital as it was improving at that time. We'll also need to check creatinine for restart of metformin  Anemia, unspecified type - Plan: CBC  -Repeat CBC, down trend in hospital may have been due to initial volume depletion and possible dilution effect. Normal pressure, asymptomatic at present  Nausea and vomiting, intractability of vomiting not specified, unspecified vomiting type - Plan: CBC  -Now resolved. Tolerating by mouth intake, RTC precautions if returns. Off amiodarone and plan for cardiology follow-up for previous A. fib. Monitor in place to capture silent A. Fib. Still on beta blocker as well as aspirin.  Thrush  -Resolved, RTC precautions if similar symptoms return. If recurrent we'll need to evaluate potential causes.  Type 2 diabetes mellitus with retinopathy, without long-term current use of insulin, macular edema presence unspecified, unspecified  laterality, unspecified retinopathy severity (HCC)  -Check electrolytes, likely will need to restart medication as uncontrolled prior to hospitalization with A1c in 8 range in November. Could restart initially with metformin as long as creatinine is stable/improving. Plan on recheck A1c in one month. Advised patient to check home readings to determine if changes needed prior to that time  Right ear pain  -Exam reassuring. Denies any hearing loss. TMJ does not appear to be cause. If not improving, or worsening symptoms, would consider ENT evaluation  No orders of the defined types were placed in this encounter.  Patient Instructions    I'm glad to hear you are doing better from the hospital. Continue healthy foods, I will check Wells sugar today, but monitor that at home and will likely need to restart some meds. If Wells sugar is over 150, then restart metformin twice per day (I will check kidney function again today as well to make sure it is ok to use metformin. Follow up in next 1 month with some home Wells sugars to determine if additional medicine needed. Follow up sooner if you are having readings in 200 range.   Continue follow up with cardiology as planned.   I will check Wells counts to check status anemia, as well as repeat that next month.   Ear looks okay today.  If that is not improving in next few weeks, difficulty hearing, or worsening - I can refer you to ear, nose and throat specialist.   Return to the clinic or go to the nearest emergency room if any of your symptoms worsen or new symptoms occur.  Thanks for coming in today.   IF you received an x-Wells today, you will receive an invoice from [facility]. Please contact [facility] at [phone] with questions or concerns regarding your invoice.   IF you received labwork today, you will receive an invoice from [facility]. Please contact [facility] at [phone] with questions or concerns regarding your  invoice.   Our billing staff will not be able to assist you with questions regarding bills from these companies.  You will be contacted with the lab results as soon as they are available. The fastest way to get your results is to activate your My Chart account. Instructions are located on the last page of this paperwork. If you have not heard from [place] regarding the results in 2 weeks, please contact this office.       I personally performed the services described in this documentation, which was scribed in my presence. The recorded information has been reviewed and considered for accuracy and completeness, addended by me as needed, and agree with information above.  Signed,   Antonio Ray, MD [facility].  07/30/17 1:10 PM

## 2017-07-31 LAB — BASIC METABOLIC PANEL
BUN / CREAT RATIO: 12 (ref 10–24)
BUN: 17 mg/dL (ref 8–27)
CO2: 20 mmol/L (ref 20–29)
CREATININE: 1.38 mg/dL — AB (ref 0.76–1.27)
Calcium: 9.5 mg/dL (ref 8.6–10.2)
Chloride: 101 mmol/L (ref 96–106)
GFR, EST AFRICAN AMERICAN: 59 mL/min/{1.73_m2} — AB (ref >59)
GFR, EST NON AFRICAN AMERICAN: 51 mL/min/{1.73_m2} — AB (ref >59)
Glucose: 201 mg/dL — ABNORMAL HIGH (ref 65–99)
Potassium: 4.7 mmol/L (ref 3.5–5.2)
Sodium: 141 mmol/L (ref 134–144)

## 2017-07-31 LAB — CBC
HEMATOCRIT: 38 % (ref 37.5–51.0)
Hemoglobin: 12.4 g/dL — ABNORMAL LOW (ref 13.0–17.7)
MCH: 30.3 pg (ref 26.6–33.0)
MCHC: 32.6 g/dL (ref 31.5–35.7)
MCV: 93 fL (ref 79–97)
PLATELETS: 271 10*3/uL (ref 150–379)
RBC: 4.09 x10E6/uL — ABNORMAL LOW (ref 4.14–5.80)
RDW: 15.8 % — AB (ref 12.3–15.4)
WBC: 3.6 10*3/uL (ref 3.4–10.8)

## 2017-08-16 ENCOUNTER — Encounter: Payer: Self-pay | Admitting: Family Medicine

## 2017-08-16 ENCOUNTER — Other Ambulatory Visit: Payer: Self-pay

## 2017-08-16 ENCOUNTER — Ambulatory Visit (INDEPENDENT_AMBULATORY_CARE_PROVIDER_SITE_OTHER): Payer: Medicare Other | Admitting: Family Medicine

## 2017-08-16 VITALS — BP 130/70 | HR 67 | Temp 98.1°F | Resp 17 | Ht 63.39 in | Wt 190.4 lb

## 2017-08-16 DIAGNOSIS — I4891 Unspecified atrial fibrillation: Secondary | ICD-10-CM

## 2017-08-16 DIAGNOSIS — E11319 Type 2 diabetes mellitus with unspecified diabetic retinopathy without macular edema: Secondary | ICD-10-CM

## 2017-08-16 DIAGNOSIS — I2510 Atherosclerotic heart disease of native coronary artery without angina pectoris: Secondary | ICD-10-CM | POA: Diagnosis not present

## 2017-08-16 DIAGNOSIS — I25118 Atherosclerotic heart disease of native coronary artery with other forms of angina pectoris: Secondary | ICD-10-CM | POA: Diagnosis not present

## 2017-08-16 DIAGNOSIS — R7989 Other specified abnormal findings of blood chemistry: Secondary | ICD-10-CM

## 2017-08-16 DIAGNOSIS — D62 Acute posthemorrhagic anemia: Secondary | ICD-10-CM

## 2017-08-16 MED ORDER — ATORVASTATIN CALCIUM 80 MG PO TABS: 80.0000 mg | ORAL_TABLET | Freq: Every day | ORAL | 1 refills | Status: DC

## 2017-08-16 NOTE — Progress Notes (Signed)
Subjective:  By signing my name below, I, Antonio Wells, attest that this documentation has been prepared under the direction and in the presence of [name], MD Electronically Signed: Ladene Artist, ED Scribe 08/16/2017 at 10:25 AM.   Patient ID: Antonio Wells, male    DOB: August 24, 1945, 72 y.o.   MRN: [id number]  Chief Complaint  Patient presents with  . Diabetes    3 month followup   HPI Antonio Wells is a 72 y.o. male who presents to Primary Care at [facility] for f/u.  DM Lab Results  Component Value Date   HGBA1C 8.2 (H) 06/20/2017  Had restarted Metformin at last visit. Meds had been temporarily held while hospitalized with renal insufficiency. Most recent creatinine 1.38 which was improved from 1.65 in Dec. Recommended restarting metformin bid. Asa qd. Last optho exam 05/03/17. Foot exam 02/03/17. Urine micro 10/2016. Pneumovax 2013. Lipitor 80 mg qd. Had previously taken metformin and glipizide but planned to just restart metformin last visit. -- Fasting blood glucose has been 114-179, more recently 114-119 with improved diet. Has stopped Glucerna shakes as he is back on normal foods. Denies diarrhea, abdominal pain, any new side-effects.  Hyperlipidemia Lab Results  Component Value Date   CHOL 159 05/12/2017   HDL 45 05/12/2017   LDLCALC 81 05/12/2017   TRIG 166 (H) 05/12/2017   CHOLHDL 3.5 05/12/2017   Lab Results  Component Value Date   ALT 21 07/11/2017   AST 24 07/11/2017   ALKPHOS 100 07/11/2017   BILITOT 0.8 07/11/2017  H/o CAD s/p CABG. See prior visits. Most recent f/u with thoracic surgeon 1/2. Was doing well at that time, planned to continue walking and cardiac rehab. Was on lifting restriction for initial 3 months post-op.-- Currently on halter monitor for a-fib. Amiodarone stopped due to intolerance. Plans to f/u with cardiology at the end of the month and start cardiac rehab. Denies cp, sob.   Anemia Lab Results  Component Value Date   WBC 3.6  07/30/2017   HGB 12.4 (L) 07/30/2017   HCT 38.0 07/30/2017   MCV 93 07/30/2017   PLT 271 07/30/2017  Thought to be post-op and was improving. Denies lightheadedness, dizziness.  Health Maintenance Pt is due for a colonoscopy which he would like to defer for a later date.  Patient Active Problem List   Diagnosis Date Noted  . Malnutrition of moderate degree 07/14/2017  . Thrush 07/11/2017  . FTT (failure to thrive) in adult 07/11/2017  . 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 CAD (coronary artery disease) 05/25/2017  . Unstable angina ([place])   . Hypertensive heart disease 07/14/2015  . Lower extremity edema 07/14/2015  . Aortic stenosis 01/09/2015  . OSA on CPAP 12/12/2014  . Hypersomnia with sleep apnea 12/12/2014  . Obstructive sleep apnea (adult) (pediatric) 02/04/2014  . 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 Obesity 02/04/2014  . Mitral regurgitation and aortic stenosis 01/02/2014  . DOE (dyspnea on exertion) 11/26/2013  . Gout 08/12/2011  . Hypertension 08/12/2011  . High cholesterol 08/12/2011  . OSA (obstructive sleep apnea) 08/12/2011  . Diabetes type 2, uncontrolled ([place]) 08/12/2011   Past Medical History:  Diagnosis Date  . Arthritis    "knees" (07/11/2017)  . CKD (chronic kidney disease), stage III ([place])   . Coronary artery disease    a.  CABGx3 in 05/2017.  Marland Kitchen Gout   . Heart murmur   . High cholesterol   . Hypertension   . Mild aortic stenosis   .  Mild mitral regurgitation   . OSA on CPAP   . Postoperative atrial fibrillation ([place]) 06/2017  . Type II diabetes mellitus ([place])    Past Surgical History:  Procedure Laterality Date  . CARDIAC CATHETERIZATION    . COLONOSCOPY  2008  . CORONARY ARTERY BYPASS GRAFT N/A 06/24/2017   Procedure: CORONARY ARTERY BYPASS GRAFTING (CABG) x 3 ; Using Left Internal Mammary Artery and Right Great Saphenous Leg Vein Harvested Endoscopically;  Surgeon: Gaye Pollack, MD;  Location: [facility] OR;  Service: Open Heart Surgery;  Laterality:  N/A;  . EYE SURGERY    . LEFT HEART CATH AND CORONARY ANGIOGRAPHY N/A 05/25/2017   Procedure: LEFT HEART CATH AND CORONARY ANGIOGRAPHY;  Surgeon: Wellington Hampshire, MD;  Location: [facility];  Service: Cardiovascular;  Laterality: N/A;  . TEE WITHOUT CARDIOVERSION N/A 06/24/2017   Procedure: TRANSESOPHAGEAL ECHOCARDIOGRAM (TEE);  Surgeon: Gaye Pollack, MD;  Location: [facility];  Service: Open Heart Surgery;  Laterality: N/A;   Allergies  Allergen Reactions  . Penicillins Other (See Comments)    Has patient had a PCN reaction causing immediate rash, facial/tongue/throat swelling, SOB or lightheadedness with hypotension: No Has patient had a PCN reaction causing severe rash involving mucus membranes or skin necrosis: No Has patient had a PCN reaction that required hospitalization: No Has patient had a PCN reaction occurring within the last 10 years: No If all of the above answers are "NO", then may proceed with Cephalosporin use.   Passed out ? SYNCOPE ?  Marland Kitchen Lisinopril Cough  . Amiodarone Nausea And Vomiting  . Oxycodone Other (See Comments)    hallucinations  . Zofran [Ondansetron Hcl]     Has an interaction with another medication pt takes   . Cortisone Nausea And Vomiting and Other (See Comments)    HICCUPS  . Tramadol Nausea And Vomiting and Other (See Comments)    HICCUPS   Prior to Admission medications   Medication Sig Start Date End Date Taking? Authorizing Provider  amLODipine (NORVASC) 10 MG tablet Take 1 tablet (10 mg total) by mouth daily. 05/12/17   [name], MD  aspirin EC 325 MG EC tablet Take 1 tablet (325 mg total) by mouth daily. 06/29/17   Gold, Wayne E, PA-C  atorvastatin (LIPITOR) 80 MG tablet Take 1 tablet (80 mg total) by mouth daily at 6 PM. Patient taking differently: Take 80 mg by mouth every morning.  05/26/17   Bhagat, Crista Luria, PA  colchicine 0.6 MG tablet Take 1-2 tablets (0.6-1.2 mg total) by mouth daily as needed (for gout flare). 2 po  at onset of flair and then repeat in 1 h for acute gout attack then 1 po qd until pain free 05/31/13   Harvie Heck, PA-C  feeding supplement, GLUCERNA SHAKE, (GLUCERNA SHAKE) LIQD Take 237 mLs by mouth 2 (two) times daily between meals. 07/14/17   Regalado, Belkys A, MD  metoprolol tartrate (LOPRESSOR) 50 MG tablet Take 1 tablet (50 mg total) by mouth 2 (two) times daily. 05/12/17   [name], MD   Social History   Socioeconomic History  . Marital status: Married    Spouse name: [name]  . Number of children: 3  . Years of education: 30  . Highest education level: Not on file  Social Needs  . Financial resource strain: Not on file  . Food insecurity - worry: Not on file  . Food insecurity - inability: Not on file  . Transportation needs - medical:  Not on file  . Transportation needs - non-medical: Not on file  Occupational History  . Occupation: Musician  Tobacco Use  . Smoking status: Former Smoker    Packs/day: 2.00    Years: 15.00    Pack years: 30.00    Types: Cigarettes    Last attempt to quit: 07/26/1998    Years since quitting: 19.0  . Smokeless tobacco: Never Used  Substance and Sexual Activity  . Alcohol use: No    Alcohol/week: 0.0 oz  . Drug use: No  . Sexual activity: Yes  Other Topics Concern  . Not on file  Social History Narrative   Patient is married [name]) and lives at home with his wife.   Patient has three adult children.   Patient is retired.   Patient is a Research officer, political party.   Patient is right-handed.   Patient drinks one cup of coffee daily and 2-3 sodas per day.      Review of Systems  Respiratory: Negative for shortness of breath.   Cardiovascular: Negative for chest pain.  Gastrointestinal: Negative for abdominal pain and diarrhea.  Neurological: Negative for dizziness and light-headedness.      Objective:   Physical Exam  Constitutional: He is oriented to person, place, and time. He appears well-developed and well-nourished.  HENT:    Head: Normocephalic and atraumatic.  Eyes: EOM are normal. Pupils are equal, round, and reactive to light.  Neck: No JVD present. Carotid bruit is not present.  Cardiovascular: Normal rate and regular rhythm.  Murmur heard.  Systolic (faint) murmur is present with a grade of 2/6. Pulmonary/Chest: Effort normal and breath sounds normal. He has no rales.  Abdominal: Soft. He exhibits no distension. There is no tenderness.  Musculoskeletal: He exhibits no edema.  Neurological: He is alert and oriented to person, place, and time.  Skin: Skin is warm and dry.  Psychiatric: He has a normal mood and affect.  Vitals reviewed.  Vitals:   08/16/17 1011  BP: 130/70  Pulse: 67  Resp: 17  Temp: 98.1 F (36.7 C)  SpO2: 99%  Weight: 190 lb 6.4 oz (86.4 kg)  Height: 5' 3.39" (1.61 m)      Assessment & Plan:   Antonio Wells is a 72 y.o. male Coronary artery disease involving native heart without angina pectoris, unspecified vessel or lesion type - Plan: atorvastatin (LIPITOR) 80 MG tablet  - Improving energy level, plans on cardiac rehabilitation next month. Continue Lipitor as currently tolerating. Plan on repeat liver testing with next blood work in next 6 weeks  Type 2 diabetes mellitus with retinopathy, without long-term current use of insulin, macular edema presence unspecified, unspecified laterality, unspecified retinopathy severity ([place])  - Tolerating restart of metformin. Continue same dose for now as home readings are improving with diet control. Plan a repeat A1c in 4-6 weeks. Commended on his diet and improved glycemic control.  Postoperative anemia due to acute blood loss  - Asymptomatic currently with improvement last visit. Plan a repeat blood work next visit.  Atrial fibrillation, unspecified type ([place])  - Asymptomatic at present, still wearing monitor with planned follow-up with cardiology.  Elevated serum creatinine  - Possible component of acute kidney injury at last  hospitalization, improving creatinine last visit. Recheck next visit.   Meds ordered this encounter  Medications  . atorvastatin (LIPITOR) 80 MG tablet    Sig: Take 1 tablet (80 mg total) by mouth daily at 6 PM.    Dispense:  90 tablet  Refill:  1   Patient Instructions    You are due for colonoscopy - call that provider or let me know if you need a referral when you are ready.   No change in metformin dosing for now. We will repeat your hemoglobin A1c, blood counts for anemia, kidney and likely liver testing at next visit in 4-6 weeks. Let me know if there are any concerns prior to that time.  Keep up the good work with diet. Thanks for coming in today.   IF you received an x-ray today, you will receive an invoice from [facility]. Please contact [facility] at [phone] with questions or concerns regarding your invoice.   IF you received labwork today, you will receive an invoice from [facility]. Please contact [facility] at [phone] with questions or concerns regarding your invoice.   Our billing staff will not be able to assist you with questions regarding bills from these companies.  You will be contacted with the lab results as soon as they are available. The fastest way to get your results is to activate your My Chart account. Instructions are located on the last page of this paperwork. If you have not heard from [place] regarding the results in 2 weeks, please contact this office.      I personally performed the services described in this documentation, which was scribed in my presence. The recorded information has been reviewed and considered for accuracy and completeness, addended by me as needed, and agree with information above.  Signed,   [name], MD Primary Care at [place].  08/16/17 12:19 PM

## 2017-08-16 NOTE — Patient Instructions (Addendum)
  You are due for colonoscopy - call that provider or let me know if you need a referral when you are ready.   No change in metformin dosing for now. We will repeat your hemoglobin A1c, blood counts for anemia, kidney and likely liver testing at next visit in 4-6 weeks. Let me know if there are any concerns prior to that time.  Keep up the good work with diet. Thanks for coming in today.   IF you received an x-ray today, you will receive an invoice from [facility]. Please contact [facility] at [phone] with questions or concerns regarding your invoice.   IF you received labwork today, you will receive an invoice from [facility]. Please contact [facility] at [phone] with questions or concerns regarding your invoice.   Our billing staff will not be able to assist you with questions regarding bills from these companies.  You will be contacted with the lab results as soon as they are available. The fastest way to get your results is to activate your My Chart account. Instructions are located on the last page of this paperwork. If you have not heard from [place] regarding the results in 2 weeks, please contact this office.

## 2017-08-30 ENCOUNTER — Telehealth (HOSPITAL_COMMUNITY): Payer: Self-pay | Admitting: Pharmacist

## 2017-08-31 NOTE — Telephone Encounter (Signed)
Cardiac Rehab Medication Review by a Pharmacist  Does the patient  feel that his/her medications are working for him/her?  Yes, feels that they are working okay for him.   Has the patient been experiencing any side effects to the medications prescribed?  Yes, seems to be tolerating pretty well, but sleeps more than normal, but not necessarily fatigue.   Does the patient measure his/her own blood pressure or blood glucose at home?  Yes, 130-140/80.   Does the patient have any problems obtaining medications due to transportation or finances?   No, patient is doing okay.  Understanding of regimen: good Understanding of indications: good Potential of compliance: good    Pharmacist comments: patient has knee problems, more in the left knee than right knee and it makes it hard for him to walk, but he does not use any assistance such as a cane or walker. He rides a bike at the gym for about an hour, and feels pretty good with little fatigue. No other questions or concerns for the patient.   Nida Boatman, PharmD PGY1 Acute Care Pharmacy Resident Pager: [phone] 08/31/2017 2:08 PM

## 2017-09-01 NOTE — Progress Notes (Signed)
Cardiology Office Note    Date:  09/02/2017  ID:  [name], DOB 1945-08-11, MRN [id number] PCP:  Antonio Agreste, MD  Cardiologist:  Dr. Meda Wells   Chief Complaint: f/u CABG  History of Present Illness:  Antonio Wells is a 72 y.o. male with history of CAD with unstable angina s/p recent CABGx3 in 05/2017 with post-op atrial fib, HTN, HLD, DM, mild AS/MR, CKD stage III, sleep apnea who presents for post-op follow-up. He was evaluated for unstable angina in October 2018 with abnormal cardiac CT. Cardiac cath showed obstructive 2V disease. Pre-op echo showed mild LVH, EF 60-65%, grade 1 DD, mild AS/MR. He returned for outpatient CABG and underwent CABGx3 with LIMA-LAD and sequential SVG to OM1/OM3. He did have post-op atrial fib treated with amiodarone. At post-op follow-up with primary care he reported persistent nausea so amiodarone was stopped after discussion with our office. He was unfortunately subsequently admitted 12/17-12/20 with persistent failure to thrive, weight loss, thrush and dehydration. He had evidence of AKI and was treated with IV fluids for hypernatremia. 30-day event monitor was recommended given d/c of amiodarone which was negative for recurrent atrial fib. F/u labs by primary care 07/30/17 showed Hgb 12.4, K 4.7, Cr 1.38 (back near baseline). Otherwise recent LFTs were normal and LDL was 81 in 04/2017.  He returns for follow-up overall doing much better. He has gotten back into the gym with gradual increase of activity 3x a week and is not having any angina or dyspnea. He is somewhat limited by his knees. He is planning on starting cardiac rehab in the next week. He admits he's not been wearing his CPAP recently as he does not feel like the mask fits right - can only tolerate about 2 hours before he has to take it off. No further nausea. No palpitations or syncope. He has had intermittent mild edema moreso in the leg with the vein graft harvests. He feels this appears at  baseline/improved today.   Past Medical History:  Diagnosis Date  . Arthritis    "knees" (07/11/2017)  . CKD (chronic kidney disease), stage III ([place])   . Coronary artery disease    a.  CABGx3 in 05/2017.  Antonio Wells Gout   . Heart murmur   . High cholesterol   . Hypertension   . Mild aortic stenosis   . Mild mitral regurgitation   . OSA on CPAP   . Postoperative atrial fibrillation ([place]) 06/2017  . Type II diabetes mellitus ([place])     Past Surgical History:  Procedure Laterality Date  . CARDIAC CATHETERIZATION    . COLONOSCOPY  2008  . CORONARY ARTERY BYPASS GRAFT N/A 06/24/2017   Procedure: CORONARY ARTERY BYPASS GRAFTING (CABG) x 3 ; Using Left Internal Mammary Artery and Right Great Saphenous Leg Vein Harvested Endoscopically;  Surgeon: Antonio Pollack, MD;  Location: [place] OR;  Service: Open Heart Surgery;  Laterality: N/A;  . EYE SURGERY    . LEFT HEART CATH AND CORONARY ANGIOGRAPHY N/A 05/25/2017   Procedure: LEFT HEART CATH AND CORONARY ANGIOGRAPHY;  Surgeon: Antonio Hampshire, MD;  Location: [place] CV LAB;  Service: Cardiovascular;  Laterality: N/A;  . TEE WITHOUT CARDIOVERSION N/A 06/24/2017   Procedure: TRANSESOPHAGEAL ECHOCARDIOGRAM (TEE);  Surgeon: Antonio Pollack, MD;  Location: [place];  Service: Open Heart Surgery;  Laterality: N/A;    Current Medications: Current Meds  Medication Sig  . amLODipine (NORVASC) 10 MG tablet Take 1 tablet (10 mg total) by  mouth daily.  Antonio Wells aspirin EC 325 MG EC tablet Take 1 tablet (325 mg total) by mouth daily.  Antonio Wells atorvastatin (LIPITOR) 80 MG tablet Take 1 tablet (80 mg total) by mouth daily at 6 PM.  . colchicine 0.6 MG tablet Take 1-2 tablets (0.6-1.2 mg total) by mouth daily as needed (for gout flare). 2 po at onset of flair and then repeat in 1 h for acute gout attack then 1 po qd until pain free  . metoprolol tartrate (LOPRESSOR) 50 MG tablet Take 1 tablet (50 mg total) by mouth 2 (two) times daily.  Allergies:   Penicillins;  Lisinopril; Amiodarone; Oxycodone; Zofran [ondansetron hcl]; Cortisone; and Tramadol   Social History   Socioeconomic History  . Marital status: Married    Spouse name: Antonio Wells  . Number of children: 3  . Years of education: 52  . Highest education level: None  Social Needs  . Financial resource strain: None  . Food insecurity - worry: None  . Food insecurity - inability: None  . Transportation needs - medical: None  . Transportation needs - non-medical: None  Occupational History  . Occupation: Musician  Tobacco Use  . Smoking status: Former Smoker    Packs/day: 2.00    Years: 15.00    Pack years: 30.00    Types: Cigarettes    Last attempt to quit: 07/26/1998    Years since quitting: 19.1  . Smokeless tobacco: Never Used  Substance and Sexual Activity  . Alcohol use: No    Alcohol/week: 0.0 oz  . Drug use: No  . Sexual activity: Yes  Other Topics Concern  . None  Social History Narrative   Patient is married Antonio Wells) and lives at home with his wife.   Patient has three adult children.   Patient is retired.   Patient is a Research officer, political party.   Patient is right-handed.   Patient drinks one cup of Wells daily and 2-3 sodas per day.        Family History:  Family History  Problem Relation Age of Onset  . Hypertension Mother   . Kidney disease Mother   . Diabetes Mother   . Hypertension Father   . Cancer Brother        lung  . Stroke Brother   . Colon cancer Neg Hx      ROS:   Please see the history of present illness.  Does note some sensitivity at sternal scar but no dehiscence erythema or suppuration All other systems are reviewed and otherwise negative.    PHYSICAL EXAM:   VS:  BP 132/62   Pulse 88   Ht 5\' 3"  (1.6 m)   Wt 182 lb 1.9 oz (82.6 kg)   SpO2 95%   BMI 32.26 kg/m   BMI: Body mass index is 32.26 kg/m. GEN: Well nourished, well developed AAM in no acute distress  HEENT: normocephalic, atraumatic Neck: no JVD, carotid bruits, or masses Cardiac:  RRR; very soft SEM RUSB, no rubs or gallops, trace RLE edema (sockline mark bilaterally but both are soft) Respiratory:  clear to auscultation bilaterally, normal work of breathing GI: soft, nontender, nondistended, + BS MS: no deformity or atrophy  Skin: warm and dry, no rash. Well healing sternal scar and endoscopic sites without dehiscence, suppuration or erythema Neuro:  Alert and Oriented x 3, Strength and sensation are intact, follows commands Psych: euthymic mood, full affect  Wt Readings from Last 3 Encounters:  09/02/17 182 lb 1.9 oz (82.6 kg)  08/16/17 190 lb 6.4 oz (86.4 kg)  07/30/17 183 lb (83 kg)      Studies/Labs Reviewed:   EKG:  EKG was ordered today and personally reviewed by me and demonstrates NSR 81bpm, left atrial enlargement, nonspecific TW changes I, avL, left axis deviation  Recent Labs: 04/27/2017: NT-Pro BNP 46 06/25/2017: TSH 0.614 07/11/2017: ALT 21 07/12/2017: Magnesium 2.3 07/30/2017: BUN 17; Creatinine, Ser 1.38; Hemoglobin 12.4; Platelets 271; Potassium 4.7; Sodium 141   Lipid Panel    Component Value Date/Time   CHOL 159 05/12/2017 1128   TRIG 166 (H) 05/12/2017 1128   HDL 45 05/12/2017 1128   CHOLHDL 3.5 05/12/2017 1128   CHOLHDL 2.8 02/24/2016 0749   VLDL 24 02/24/2016 0749   LDLCALC 81 05/12/2017 1128    Additional studies/ records that were reviewed today include: Summarized above.    ASSESSMENT & PLAN:   1. CAD - doing well post-operatively. I will reach out to Dr. Meda Wells to clarify when to reduce the dose of aspirin (currently on 325mg ) and to get her thoughts on when he may resume swimming. Continue beta blocker and statin. He has trace peripheral edema of the right lower extremity which is the leg from which his vein grafts are harvested. This is a fairly common finding and there is otherwise no sign of DVT on exam. He is not having any signs of heart failure otherwise and weight is below baseline. Trace edema may also be partly due  to amlodipine. Will continue to observe. Discussed observation for any worsening at which time we might consider low dose diuretic with close f/u of his renal function. He has been more cognizant of his positive dietary changes recently as well. 2. Paroxysmal atrial fib - did not tolerate amiodarone as above. Fortunately f/u event monitor was negative for recurrent PAF. I have advised he f/u with the physician who follows his sleep apnea to discuss mask adjustment since untreated OSA can also increase risk of recurrence of atrial fib. He does follow locally with a doctor for this but cannot recall their name off the top of his head. 3. Essential HTN - BP upper limits of normal but other values were recently normal. Encouraged OSA f/u as above. Given issues with dehydration recently, will hold off on escalation of therapy but I did advise he follow this at home/cardiac rehab and notify our office if he is seeing readings greater than 093 systolic regularly. 4. Hyperlipidemia - he prefers to have lipids rechecked when he goes back to primary care in a few weeks. Will route this note to Dr. Carlota Raspberry to request he set this up. Continue statin. LFTs in 06/2017 were OK. 5. Mild aortic stenosis and mitral regurgitation - asymptomatic; follow clinically.  Disposition: F/u with Dr. Meda Wells in 2-3 months.  Medication Adjustments/Labs and Tests Ordered: Current medicines are reviewed at length with the patient today.  Concerns regarding medicines are outlined above. Medication changes, Labs and Tests ordered today are summarized above and listed in the Patient Instructions accessible in Encounters.   Signed, Charlie Pitter, PA-C  09/02/2017 10:20 AM    [facility] [address], [place], [place]  [address] Phone: [phone]; Fax: [phone]

## 2017-09-02 ENCOUNTER — Telehealth: Payer: Self-pay | Admitting: Physician Assistant

## 2017-09-02 ENCOUNTER — Ambulatory Visit (INDEPENDENT_AMBULATORY_CARE_PROVIDER_SITE_OTHER): Payer: Medicare Other | Admitting: Physician Assistant

## 2017-09-02 ENCOUNTER — Encounter: Payer: Self-pay | Admitting: Physician Assistant

## 2017-09-02 VITALS — BP 132/62 | HR 88 | Ht 63.0 in | Wt 182.1 lb

## 2017-09-02 DIAGNOSIS — I2510 Atherosclerotic heart disease of native coronary artery without angina pectoris: Secondary | ICD-10-CM

## 2017-09-02 DIAGNOSIS — I480 Paroxysmal atrial fibrillation: Secondary | ICD-10-CM | POA: Diagnosis not present

## 2017-09-02 DIAGNOSIS — I350 Nonrheumatic aortic (valve) stenosis: Secondary | ICD-10-CM

## 2017-09-02 DIAGNOSIS — I10 Essential (primary) hypertension: Secondary | ICD-10-CM | POA: Diagnosis not present

## 2017-09-02 DIAGNOSIS — E785 Hyperlipidemia, unspecified: Secondary | ICD-10-CM

## 2017-09-02 DIAGNOSIS — Z951 Presence of aortocoronary bypass graft: Secondary | ICD-10-CM | POA: Diagnosis not present

## 2017-09-02 DIAGNOSIS — I340 Nonrheumatic mitral (valve) insufficiency: Secondary | ICD-10-CM | POA: Diagnosis not present

## 2017-09-02 MED ORDER — ASPIRIN EC 81 MG PO TBEC: 81.0000 mg | DELAYED_RELEASE_TABLET | Freq: Every day | ORAL | 3 refills | Status: AC

## 2017-09-02 NOTE — Telephone Encounter (Signed)
New message ° ° °Patient returning call back to nurse.  °

## 2017-09-02 NOTE — Progress Notes (Signed)
We can switch to 81 mg po daily, there is no evidence about the dose, [place] Siren

## 2017-09-02 NOTE — Telephone Encounter (Signed)
Left message for pt to call back re: Aspirin and the ok to return to swimming whenever he feels comfortable.

## 2017-09-02 NOTE — Telephone Encounter (Signed)
Please let patient know I spoke with Dr. Meda Coffee and she answered two questions for me: 1) he is OK to go ahead and decrease aspirin to 81mg  daily. (we didn't specifically spend time talking about this, but just something we screen for - she states no benefit to taking higher dose now) 2) she feels he is OK to resume swimming whenever he feels comfortable doing so.  Roen Macgowan PA-C

## 2017-09-02 NOTE — Addendum Note (Signed)
Addended by: Gaetano Net on: 09/02/2017 11:24 AM   Modules accepted: Orders

## 2017-09-02 NOTE — Patient Instructions (Signed)
Medication Instructions:  Your physician recommends that you continue on your current medications as directed. Please refer to the Current Medication list given to you today.   Labwork: None ordered  Testing/Procedures: None ordered  Follow-Up: Your physician recommends that you schedule a follow-up appointment in: 2-3 [place]   Any Other Special Instructions Will Be Listed Below (If Applicable).     If you need a refill on your cardiac medications before your next appointment, please call your pharmacy.

## 2017-09-05 NOTE — Telephone Encounter (Signed)
Left a message for pt to call back

## 2017-09-06 ENCOUNTER — Encounter (HOSPITAL_COMMUNITY): Payer: Self-pay

## 2017-09-06 ENCOUNTER — Encounter (HOSPITAL_COMMUNITY)
Admission: RE | Admit: 2017-09-06 | Discharge: 2017-09-06 | Disposition: A | Discharge status: Home / Self Care | Payer: Medicare Other | Source: Ambulatory Visit | Attending: Cardiology | Admitting: Cardiology

## 2017-09-06 ENCOUNTER — Telehealth: Payer: Self-pay | Admitting: Neurology

## 2017-09-06 VITALS — BP 120/80 | HR 72 | Ht 63.0 in | Wt 181.4 lb

## 2017-09-06 DIAGNOSIS — Z951 Presence of aortocoronary bypass graft: Secondary | ICD-10-CM | POA: Diagnosis not present

## 2017-09-06 DIAGNOSIS — Z79899 Other long term (current) drug therapy: Secondary | ICD-10-CM | POA: Insufficient documentation

## 2017-09-06 DIAGNOSIS — Z7984 Long term (current) use of oral hypoglycemic drugs: Secondary | ICD-10-CM | POA: Diagnosis not present

## 2017-09-06 DIAGNOSIS — Z7982 Long term (current) use of aspirin: Secondary | ICD-10-CM | POA: Insufficient documentation

## 2017-09-06 NOTE — Telephone Encounter (Signed)
Pt is asking the settings be changed on his CPAP, pt states especially the pressure.  Please call

## 2017-09-06 NOTE — Progress Notes (Signed)
Antonio Wells 72 y.o. male DOB: [birth year] MRN: [id number]      Nutrition Note  1. S/P CABG (coronary artery bypass graft)    Past Medical History:  Diagnosis Date  . Arthritis    "knees" (07/11/2017)  . CKD (chronic kidney disease), stage III ([place])   . Coronary artery disease    a.  CABGx3 in 05/2017.  Marland Kitchen Gout   . Heart murmur   . High cholesterol   . Hypertension   . Mild aortic stenosis   . Mild mitral regurgitation   . OSA on CPAP   . Postoperative atrial fibrillation ([place]) 06/2017  . Type II diabetes mellitus ([place])    Meds reviewed.   HT: Ht Readings from Last 1 Encounters:  09/02/17 5\' 3"  (1.6 m)    WT: Wt Readings from Last 3 Encounters:  09/02/17 182 lb 1.9 oz (82.6 kg)  08/16/17 190 lb 6.4 oz (86.4 kg)  07/30/17 183 lb (83 kg)     BMI 32.2   Current tobacco use? No   Labs:  Lipid Panel     Component Value Date/Time   CHOL 159 05/12/2017 1128   TRIG 166 (H) 05/12/2017 1128   HDL 45 05/12/2017 1128   CHOLHDL 3.5 05/12/2017 1128   CHOLHDL 2.8 02/24/2016 0749   VLDL 24 02/24/2016 0749   LDLCALC 81 05/12/2017 1128    Lab Results  Component Value Date   HGBA1C 8.2 (H) 06/20/2017   CBG (last 3)  No results for input(s): GLUCAP in the last 72 hours.  Nutrition Note Spoke with pt and pt's wife, [name]. Nutrition plan and goals reviewed with pt. Pt with recent h/o severe acute malnutrition in the context of acute illness/injury. Pt has gained 8 lb (4.6% increase) over the past 2 months. Pt wants to lose wt. Pt reports he was told to "watch my carbs." Pt has been following a "Keto type diet" for wt loss and carb control. Per discussion, pt does not know how many carbs he should be consuming "I'm just not eating bread or potatoes." Keto diet discussed. Wt loss tips reviewed.  Pt is diabetic. Last A1c indicates blood glucose not optimally controlled. Pt checks CBG's once daily. Fasting CBG's reportedly 132 mg/dL this morning. Pt expressed understanding of the  information reviewed. Pt aware of nutrition education classes offered and plans on attending nutrition classes.  Nutrition Diagnosis ? Severe malnutrition related to acute illness (CABG 06/24/17) as evidenced by energy intake less than or equal to 50% for 5 days or more and 12% wt loss within one month. - resolved due to p.o. Intake reportedly good and pt with 4.6% increase in wt over the past 2 months.  ? Food-and nutrition-related knowledge deficit related to lack of exposure to information as related to diagnosis of: ? CVD ? DM ? Obesity related to excessive energy intake as evidenced by a BMI of 32.2  Nutrition Intervention ? Pt's individual nutrition plan and goals reviewed with pt.  Nutrition Goal(s):  ? Pt to identify food quantities necessary to achieve weight loss of 6-20lb at graduation from cardiac rehab. Goal wt of 160 lb desired.   Plan:  Pt to attend nutrition classes ? Nutrition I ? Nutrition II ? Portion Distortion ? Diabetes Blitz ? Diabetes Q & A Will provide client-centered nutrition education as part of interdisciplinary care.   Monitor and evaluate progress toward nutrition goal with team.  Derek Mound, M.Ed, RD, LDN, CDE 09/06/2017 11:03 AM

## 2017-09-06 NOTE — Progress Notes (Signed)
Cardiac Individual Treatment Plan  Patient Details  Name: Antonio Wells MRN: [id number] Date of Birth: 05-May-1946 Referring Provider:     CARDIAC REHAB PHASE II ORIENTATION from 09/06/2017 in [facility]  Referring Provider  Ena Dawley, MD.      Initial Encounter Date:    CARDIAC REHAB PHASE II ORIENTATION from 09/06/2017 in [facility]  Date  09/06/17  Referring Provider  Ena Dawley, MD.      Visit Diagnosis: S/P CABG (coronary artery bypass graft)  Patient's Home Medications on Admission:  Current Outpatient Medications:  .  amLODipine (NORVASC) 10 MG tablet, Take 1 tablet (10 mg total) by mouth daily., Disp: 90 tablet, Rfl: 1 .  aspirin EC 81 MG tablet, Take 1 tablet (81 mg total) by mouth daily., Disp: 90 tablet, Rfl: 3 .  atorvastatin (LIPITOR) 80 MG tablet, Take 1 tablet (80 mg total) by mouth daily at 6 PM., Disp: 90 tablet, Rfl: 1 .  colchicine 0.6 MG tablet, Take 1-2 tablets (0.6-1.2 mg total) by mouth daily as needed (for gout flare). 2 po at onset of flair and then repeat in 1 h for acute gout attack then 1 po qd until pain free, Disp: 30 tablet, Rfl: 0 .  metoprolol tartrate (LOPRESSOR) 50 MG tablet, Take 1 tablet (50 mg total) by mouth 2 (two) times daily., Disp: 180 tablet, Rfl: 1  Past Medical History: Past Medical History:  Diagnosis Date  . Arthritis    "knees" (07/11/2017)  . CKD (chronic kidney disease), stage III ([place])   . Coronary artery disease    a.  CABGx3 in 05/2017.  [facility] Kitchen Gout   . Heart murmur   . High cholesterol   . Hypertension   . Mild aortic stenosis   . Mild mitral regurgitation   . OSA on CPAP   . Postoperative atrial fibrillation ([place]) 06/2017  . Type II diabetes mellitus ([facility])     Tobacco Use: Social History   Tobacco Use  Smoking Status Former Smoker  . Packs/day: 2.00  . Years: 15.00  . Pack years: 30.00  . Types: Cigarettes  . Last attempt to quit:  07/26/1998  . Years since quitting: 19.1  Smokeless Tobacco Never Used    Labs: Recent Review Flowsheet Data    Labs for ITP Cardiac and Pulmonary Rehab Latest Ref Rng & Units 06/24/2017 06/24/2017 06/24/2017 06/24/2017 06/25/2017   Cholestrol 100 - 199 mg/dL - - - - -   LDLCALC 0 - 99 mg/dL - - - - -   HDL >39 mg/dL - - - - -   Trlycerides 0 - 149 mg/dL - - - - -   Hemoglobin A1c 4.8 - 5.6 % - - - - -   PHART 7.350 - 7.450 7.433 7.374 - 7.361 -   PCO2ART 32.0 - 48.0 mmHg 38.1 45.1 - 46.4 -   HCO3 20.0 - 28.0 mmol/L 25.7 26.2 - 26.2 -   TCO2 22 - 32 mmol/L 27 28 24 28 24    O2SAT % 98.0 93.0 - 94.0 -      Capillary Blood Glucose: Lab Results  Component Value Date   GLUCAP 118 (H) 07/14/2017   GLUCAP 113 (H) 07/14/2017   GLUCAP 118 (H) 07/13/2017   GLUCAP 139 (H) 07/13/2017   GLUCAP 116 (H) 07/13/2017     Exercise Target Goals: Date: 09/06/17  Exercise Program Goal: Individual exercise prescription set using results from initial 6 min walk test  and THRR while considering  patient's activity barriers and safety.   Exercise Prescription Goal: Initial exercise prescription builds to 30-45 minutes a day of aerobic activity, 2-3 days per week.  Home exercise guidelines will be given to patient during program as part of exercise prescription that the participant will acknowledge.  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 Right knee pain. Bilateral hip pain with walking.    Cardiac Risk Stratification  High       6 Minute Walk: 6 Minute Walk    Row Name 09/06/17 1146         6 Minute Walk   Phase  Initial     Distance  839 feet     Walk Time  6 minutes     # of Rest Breaks  0     MPH  1.59     METS  1.87     RPE  13     VO2 Peak  6.54     Symptoms   Yes (comment)     Comments  Patient c/o knee pain, 6/10 on the pain scale.     Resting HR  72 bpm     Resting BP  120/80     Resting Oxygen Saturation   98 %     Exercise Oxygen Saturation  during 6 min walk  99 %     Max Ex. HR  89 bpm     Max Ex. BP  160/78     2 Minute Post BP  120/80        Oxygen Initial Assessment:   Oxygen Re-Evaluation:   Oxygen Discharge (Final Oxygen Re-Evaluation):   Initial Exercise Prescription: Initial Exercise Prescription - 09/06/17 1100      Date of Initial Exercise RX and Referring Provider   Date  09/06/17    Referring Provider  Ena Dawley, MD.      Recumbant Bike   Level  3    Minutes  10    METs  2      NuStep   Level  3    SPM  85    Minutes  10    METs  2      Arm Ergometer   Level  3    RPM  30    Minutes  10      Prescription Details   Frequency (times per week)  3    Duration  Progress to 30 minutes of continuous aerobic without signs/symptoms of physical distress      Intensity   THRR 40-80% of Max Heartrate  60-120    Ratings of Perceived Exertion  11-13    Perceived Dyspnea  --      Progression   Progression  Continue to progress workloads to maintain intensity without signs/symptoms of physical distress.      Resistance Training   Training Prescription  Yes    Weight  3lbs.    Reps  10-15       Perform Capillary Blood Glucose checks as needed.  Exercise Prescription Changes:   Exercise Comments:   Exercise Goals and Review: Exercise Goals    Row Name 09/06/17 1152             Exercise Goals  Increase Physical Activity  Yes       Intervention  Provide advice, education, support and counseling about physical activity/exercise needs.;Develop an individualized exercise prescription for aerobic and resistive training based on initial evaluation findings, risk stratification, comorbidities and participant's personal goals.       Expected Outcomes  Long Term: Exercising regularly at least  3-5 days a week.;Long Term: Add in home exercise to make exercise part of routine and to increase amount of physical activity.       Increase Strength and Stamina  Yes       Intervention  Provide advice, education, support and counseling about physical activity/exercise needs.;Develop an individualized exercise prescription for aerobic and resistive training based on initial evaluation findings, risk stratification, comorbidities and participant's personal goals.       Expected Outcomes  Short Term: Increase workloads from initial exercise prescription for resistance, speed, and METs.;Short Term: Perform resistance training exercises routinely during rehab and add in resistance training at home;Long Term: Improve cardiorespiratory fitness, muscular endurance and strength as measured by increased METs and functional capacity (6MWT)       Able to understand and use rate of perceived exertion (RPE) scale  Yes       Intervention  Provide education and explanation on how to use RPE scale       Expected Outcomes  Short Term: Able to use RPE daily in rehab to express subjective intensity level;Long Term:  Able to use RPE to guide intensity level when exercising independently       Knowledge and understanding of Target Heart Rate Range (THRR)  Yes       Intervention  Provide education and explanation of THRR including how the numbers were predicted and where they are located for reference       Expected Outcomes  Short Term: Able to state/look up THRR;Long Term: Able to use THRR to govern intensity when exercising independently;Short Term: Able to use daily as guideline for intensity in rehab       Able to check pulse independently  Yes       Intervention  Provide education and demonstration on how to check pulse in carotid and radial arteries.;Review the importance of being able to check your own pulse for safety during independent exercise       Expected Outcomes  Short Term: Able to explain why pulse checking  is important during independent exercise;Long Term: Able to check pulse independently and accurately       Understanding of Exercise Prescription  Yes       Intervention  Provide education, explanation, and written materials on patient's individual exercise prescription       Expected Outcomes  Short Term: Able to explain program exercise prescription;Long Term: Able to explain home exercise prescription to exercise independently          Exercise Goals Re-Evaluation :    Discharge Exercise Prescription (Final Exercise Prescription Changes):   Nutrition:  Target Goals: Understanding of nutrition guidelines, daily intake of sodium 1500mg , cholesterol 200mg , calories 30% from fat and 7% or less from saturated fats, daily to have 5 or more servings of fruits and vegetables.  Biometrics: Pre Biometrics - 09/06/17 1153      Pre Biometrics   Height  5\' 3"  (1.6 m)    Weight  181 lb 7 oz (82.3 kg)    Waist Circumference  39.75 inches    Hip Circumference  40.25 inches    Waist to Hip Ratio  0.99 %    BMI (Calculated)  32.15    Triceps Skinfold  15 mm    % Body Fat  29.8 %    Grip Strength  41.5 kg    Flexibility  14 in    Single Leg Stand  30 seconds        Nutrition Therapy Plan and Nutrition Goals:   Nutrition Assessments:   Nutrition Goals Re-Evaluation:   Nutrition Goals Re-Evaluation:   Nutrition Goals Discharge (Final Nutrition Goals Re-Evaluation):   Psychosocial: Target Goals: Acknowledge presence or absence of significant depression and/or stress, maximize coping skills, provide positive support system. Participant is able to verbalize types and ability to use techniques and skills needed for reducing stress and depression.  Initial Review & Psychosocial Screening: Initial Psych Review & Screening - 09/06/17 1208      Initial Review   Current issues with  None Identified      Family Dynamics   Good Support System?  Yes wife present during orientation     Comments  no psychosocial needsd identified, no interventions necessary       Barriers   Psychosocial barriers to participate in program  There are no identifiable barriers or psychosocial needs.      Screening Interventions   Interventions  Encouraged to exercise       Quality of Life Scores:  Scores of 19 and below usually indicate a poorer quality of life in these areas.  A difference of  2-3 points is a clinically meaningful difference.  A difference of 2-3 points in the total score of the Quality of Life Index has been associated with significant improvement in overall quality of life, self-image, physical symptoms, and general health in studies assessing change in quality of life.  PHQ-9: Recent Review Flowsheet Data    Depression screen [facility] 2/9 08/16/2017 07/30/2017 07/11/2017 07/07/2017 05/12/2017   Decreased Interest 0 0 0 0 0   Down, Depressed, Hopeless 0 0 0 0 0   PHQ - 2 Score 0 0 0 0 0     Interpretation of Total Score  Total Score Depression Severity:  1-4 = Minimal depression, 5-9 = Mild depression, 10-14 = Moderate depression, 15-19 = Moderately severe depression, 20-27 = Severe depression   Psychosocial Evaluation and Intervention:   Psychosocial Re-Evaluation:   Psychosocial Discharge (Final Psychosocial Re-Evaluation):   Vocational Rehabilitation: Provide vocational rehab assistance to qualifying candidates.   Vocational Rehab Evaluation & Intervention:   Education: Education Goals: Education classes will be provided on a weekly basis, covering required topics. Participant will state understanding/return demonstration of topics presented.  Learning Barriers/Preferences: Learning Barriers/Preferences - 09/06/17 1155      Learning Barriers/Preferences   Learning Barriers  None    Learning Preferences  None       Education Topics: Count Your Pulse:  -Group instruction provided by verbal instruction, demonstration, patient participation and  written materials to support subject.  Instructors address importance of being able to find your pulse and how to count your pulse when at home without a heart monitor.  Patients get hands on experience counting their pulse with staff help and individually.   Heart Attack, Angina, and Risk Factor Modification:  -Group instruction provided by verbal instruction, video, and written materials to support subject.  Instructors address signs and symptoms of angina and heart attacks.    Also discuss risk factors for heart disease and how to make changes to improve heart health risk factors.   Functional Fitness:  -  Group instruction provided by verbal instruction, demonstration, patient participation, and written materials to support subject.  Instructors address safety measures for doing things around the house.  Discuss how to get up and down off the floor, how to pick things up properly, how to safely get out of a chair without assistance, and balance training.   Meditation and Mindfulness:  -Group instruction provided by verbal instruction, patient participation, and written materials to support subject.  Instructor addresses importance of mindfulness and meditation practice to help reduce stress and improve awareness.  Instructor also leads participants through a meditation exercise.    Stretching for Flexibility and Mobility:  -Group instruction provided by verbal instruction, patient participation, and written materials to support subject.  Instructors lead participants through series of stretches that are designed to increase flexibility thus improving mobility.  These stretches are additional exercise for major muscle groups that are typically performed during regular warm up and cool down.   Hands Only CPR:  -Group verbal, video, and participation provides a basic overview of AHA guidelines for community CPR. Role-play of emergencies allow participants the opportunity to practice calling for  help and chest compression technique with discussion of AED use.   Hypertension: -Group verbal and written instruction that provides a basic overview of hypertension including the most recent diagnostic guidelines, risk factor reduction with self-care instructions and medication management.    Nutrition I class: Heart Healthy Eating:  -Group instruction provided by PowerPoint slides, verbal discussion, and written materials to support subject matter. The instructor gives an explanation and review of the Therapeutic Lifestyle Changes diet recommendations, which includes a discussion on lipid goals, dietary fat, sodium, fiber, plant stanol/sterol esters, sugar, and the components of a well-balanced, healthy diet.   Nutrition II class: Lifestyle Skills:  -Group instruction provided by PowerPoint slides, verbal discussion, and written materials to support subject matter. The instructor gives an explanation and review of label reading, grocery shopping for heart health, heart healthy recipe modifications, and ways to make healthier choices when eating out.   Diabetes Question & Answer:  -Group instruction provided by PowerPoint slides, verbal discussion, and written materials to support subject matter. The instructor gives an explanation and review of diabetes co-morbidities, pre- and post-prandial blood glucose goals, pre-exercise blood glucose goals, signs, symptoms, and treatment of hypoglycemia and hyperglycemia, and foot care basics.   Diabetes Blitz:  -Group instruction provided by PowerPoint slides, verbal discussion, and written materials to support subject matter. The instructor gives an explanation and review of the physiology behind type 1 and type 2 diabetes, diabetes medications and rational behind using different medications, pre- and post-prandial blood glucose recommendations and Hemoglobin A1c goals, diabetes diet, and exercise including blood glucose guidelines for exercising  safely.    Portion Distortion:  -Group instruction provided by PowerPoint slides, verbal discussion, written materials, and food models to support subject matter. The instructor gives an explanation of serving size versus portion size, changes in portions sizes over the last 20 years, and what consists of a serving from each food group.   Stress Management:  -Group instruction provided by verbal instruction, video, and written materials to support subject matter.  Instructors review role of stress in heart disease and how to cope with stress positively.     Exercising on Your Own:  -Group instruction provided by verbal instruction, power point, and written materials to support subject.  Instructors discuss benefits of exercise, components of exercise, frequency and intensity of exercise, and end points for exercise.  Also discuss use of nitroglycerin and activating EMS.  Review options of places to exercise outside of rehab.  Review guidelines for sex with heart disease.   Cardiac Drugs I:  -Group instruction provided by verbal instruction and written materials to support subject.  Instructor reviews cardiac drug classes: antiplatelets, anticoagulants, beta blockers, and statins.  Instructor discusses reasons, side effects, and lifestyle considerations for each drug class.   Cardiac Drugs II:  -Group instruction provided by verbal instruction and written materials to support subject.  Instructor reviews cardiac drug classes: angiotensin converting enzyme inhibitors (ACE-I), angiotensin II receptor blockers (ARBs), nitrates, and calcium channel blockers.  Instructor discusses reasons, side effects, and lifestyle considerations for each drug class.   Anatomy and Physiology of the Circulatory System:  Group verbal and written instruction and models provide basic cardiac anatomy and physiology, with the coronary electrical and arterial systems. Review of: AMI, Angina, Valve disease, Heart  Failure, Peripheral Artery Disease, Cardiac Arrhythmia, Pacemakers, and the ICD.   Other Education:  -Group or individual verbal, written, or video instructions that support the educational goals of the cardiac rehab program.   Holiday Eating Survival Tips:  -Group instruction provided by PowerPoint slides, verbal discussion, and written materials to support subject matter. The instructor gives patients tips, tricks, and techniques to help them not only survive but enjoy the holidays despite the onslaught of food that accompanies the holidays.   Knowledge Questionnaire Score: Knowledge Questionnaire Score - 09/06/17 1207      Knowledge Questionnaire Score   Pre Score  -- pt did not return homework at orientation       Core Components/Risk Factors/Patient Goals at Admission: Personal Goals and Risk Factors at Admission - 09/06/17 1141      Core Components/Risk Factors/Patient Goals on Admission    Weight Management  Yes;Obesity    Intervention  Weight Management/Obesity: Establish reasonable short term and long term weight goals.;Obesity: Provide education and appropriate resources to help participant work on and attain dietary goals.    Admit Weight  181 lb 7 oz (82.3 kg)    Goal Weight: Short Term  175 lb (79.4 kg)    Goal Weight: Long Term  160 lb (72.6 kg)    Expected Outcomes  Short Term: Continue to assess and modify interventions until short term weight is achieved;Long Term: Adherence to nutrition and physical activity/exercise program aimed toward attainment of established weight goal;Weight Loss: Understanding of general recommendations for a balanced deficit meal plan, which promotes 1-2 lb weight loss per week and includes a negative energy balance of [phone] kcal/d    Diabetes  Yes    Intervention  Provide education about signs/symptoms and action to take for hypo/hyperglycemia.;Provide education about proper nutrition, including hydration, and aerobic/resistive exercise  prescription along with prescribed medications to achieve blood glucose in normal ranges: Fasting glucose 65-99 mg/dL    Expected Outcomes  Short Term: Participant verbalizes understanding of the signs/symptoms and immediate care of hyper/hypoglycemia, proper foot care and importance of medication, aerobic/resistive exercise and nutrition plan for blood glucose control.;Long Term: Attainment of HbA1C < 7%.    Hypertension  Yes    Intervention  Provide education on lifestyle modifcations including regular physical activity/exercise, weight management, moderate sodium restriction and increased consumption of fresh fruit, vegetables, and low fat dairy, alcohol moderation, and smoking cessation.;Monitor prescription use compliance.    Expected Outcomes  Short Term: Continued assessment and intervention until BP is < 140/28mm HG in hypertensive participants. < 130/58mm HG in hypertensive  participants with diabetes, heart failure or chronic kidney disease.;Long Term: Maintenance of blood pressure at goal levels.    Lipids  Yes    Intervention  Provide education and support for participant on nutrition & aerobic/resistive exercise along with prescribed medications to achieve LDL 70mg , HDL >40mg .    Expected Outcomes  Short Term: Participant states understanding of desired cholesterol values and is compliant with medications prescribed. Participant is following exercise prescription and nutrition guidelines.;Long Term: Cholesterol controlled with medications as prescribed, with individualized exercise RX and with personalized nutrition plan. Value goals: LDL < 70mg , HDL > 40 mg.       Core Components/Risk Factors/Patient Goals Review:    Core Components/Risk Factors/Patient Goals at Discharge (Final Review):    ITP Comments: ITP Comments    Row Name 09/06/17 0919           ITP Comments  Dr. Fransico Him, Medical Director           Comments: Patient attended orientation from [phone] o [phone]  to  review rules and guidelines for program. Completed 6 minute walk test, Intitial ITP, and exercise prescription.  VSS. Telemetry-sinus rhythm,   Asymptomatic.Andi Hence, RN, BSN Cardiac Pulmonary Rehab 09/06/17  12:13 PM

## 2017-09-07 ENCOUNTER — Encounter: Payer: Self-pay | Admitting: Neurology

## 2017-09-07 ENCOUNTER — Ambulatory Visit (INDEPENDENT_AMBULATORY_CARE_PROVIDER_SITE_OTHER): Payer: Medicare Other | Admitting: Neurology

## 2017-09-07 VITALS — BP 145/84 | HR 70 | Ht 63.0 in | Wt 182.0 lb

## 2017-09-07 DIAGNOSIS — I25110 Atherosclerotic heart disease of native coronary artery with unstable angina pectoris: Secondary | ICD-10-CM

## 2017-09-07 DIAGNOSIS — G4733 Obstructive sleep apnea (adult) (pediatric): Secondary | ICD-10-CM | POA: Diagnosis not present

## 2017-09-07 DIAGNOSIS — E1165 Type 2 diabetes mellitus with hyperglycemia: Secondary | ICD-10-CM | POA: Diagnosis not present

## 2017-09-07 DIAGNOSIS — Z951 Presence of aortocoronary bypass graft: Secondary | ICD-10-CM

## 2017-09-07 DIAGNOSIS — I2510 Atherosclerotic heart disease of native coronary artery without angina pectoris: Secondary | ICD-10-CM

## 2017-09-07 NOTE — Progress Notes (Addendum)
[facility]  Provider:  Larey Seat, M D  Referring Provider: [name], MD Primary Care Physician:  [name], MD  Chief Complaint  Patient presents with  . Follow-up    pt alone, rm 10. pt states prior to his open heart surgery he was using the machine. he had surgery right after thanksgiving and he has attempted multiple times to restart and the pressure seems to much. DME [facility]    HPI:  Antonio Wells is a 72 y.o. male , who  is seen here as a referral  from Dr. Carlota Raspberry for sleep apnea evaluation,   07 September 2017- this is a revisit for [name], meanwhile 72 years of age, and established CPAP patient in our practice whom I had last seen in May 2016.  At the time he had just requalified by a new sleep test for the diagnosis of OSA and had received CPAP supplies.  His first revisit showed good compliance.  In the meantime he has not followed up and he states that by late 2018 he had been hospitalized at [facility] for triple cardiac bypass surgery, he could not tolerate the CPAP at the current settings and stopped using it. The last time I saw the patient use the machine for a week was between 25 November and 26 June 2017,  with an average use of time of 4 hours and 29 minutes, reaching an AHI of 2.0 when used.  He did have high air leaks, with CPAP was set at 11 cm water pressure with 3 cm EPR based on his last sleep study from 2015.  At this time I would need a new sleep study once again to confirm if the diagnosis of apnea is still present, and would likely then treat the patient with an auto titration machine which has the ability to adjust pressures.  His current machine is barely 71 years old. He is considered non compliant. [facility] is his DME.    May, 2014CD- Antonio Wells is afro-american , right handed gentleman with a known diagnosis of OSA, diagnosed in 2011. At the time he was still full time employed.   He was placed on CPAP after being told he had severe apnea. He discontinued using CPAP after he felt he had no benefit from using the CPAP.   He  felt neither refreshed nor restored and continues to have trouble staying awake in meetings. This "embarrassment " caused him to look for early retirement. He has poorly controled diabetes and confessed this may cause fatigue as well. He is now retired and often can take a nap after lunch , which would have been impossible while gainfully employed . 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  He is a Therapist, nutritional, Teacher, English as a foreign language - when reading or studying a new piece, he can fall asleep!  He has gained weight slowly, for  over a decade. He gained 10 pounds the last 3 years He will usually go to sleep around 11.00 PM , he watches TV in bedroom( set on a sleeper 15 minutes )  , he falls asleep promptly.It varied greatly how long he sleeps, due to pain form arthritis in back , knees and hips. He became a side sleeper.  He has some restless legs symptoms, witnessed PLMs.  He is a loud snorer and has apneas, witnessed by his wife, who shares the bedroom with him. The bed room  is cool, quiet and dark.  He wakes up with an alarm, needs it. He wakes up but doesn't rise , drinks a cup of coffee in the morning. Diet soda in afternoon. He is not a smoker and not ETOH drinker.  He has no trauma or surgeries to neck, face or airway. No tonsillectomy.   Interval history 05-30-14, [name] underwent a split night polysomnography on 8-20 4-15. He was diagnosed with an AHI of 58.1 and an RDI of 59.4 nearly all apneas were obstructive in nature his lowest oxygen level was 66% saturation 26.7 minutes of time he was then titrated to CPAP and the hypoxemia was much shorter 9.7 minutes but the nadir was still 75%. He did well on 11 cm water /3 cm EPR , a F and P pico mask in large size,  nasal model , was used. He is here today for his first follow-up visit after  CPAP was initiated. Today's compliance report for the last 30 days shows 100% compliance on days and 28 days of use a time over 4 hours at night. The was user time is 6 hours and 29 minutes the set pressure is 11 cm with 3 cm EPR the residual AHI is 1.6 and the airleak is some very modest at 7 L/m. The patient reports that he is no longer snoring this has been confirmed by his spouse.  One nocturia , some nights none. He feels refreshed in the morning , he feels still sleepy in PM. He has tried a small and large nasal mask, but not a medium or standard size. 11 cm water pressure CPAP, 3 cm EPR.  Consider nuvigil for this patient with high CPAP compliance and residual Epworth of 15, FSS of 30.  Persistent daytime slepiness - retired , not under the same pressure.  Will try Nuvigil / modafinil prn.   Interval history 12-12-14  [name] is here today for a yearly revisit and CPAP compliance. His split-night polysomnography from 8-20 4-15 have been quoted above the patient had been titrated to 11 cm water was 170 m flex after a HI of 58 was diagnosed he also had 75% oxygen nadir. His compliance record is excellent 87% 4 days 26 out of 30 days, 21 of those days over 4 hours which is a compliance of 70% average user time right now is 4 hours and 30 minutes he has skipped about 3 days out of the last 40 days on CPAP use. The machine is still set at 11 cm water pressure with 3 cm EPR his AHI is 1.7. He does not need any adjustments this is an excellent result. I would like for [name] to use the machine about half an hour or more each day. Fatigue severity score is down to 23 points and Epworth sleepiness score was high at 16. Knowing that his sleep apnea is sufficiently treated I had prescribed modafinil after his last visit but she found was not covered by his insurance - burden to him with an out-of-pocket cost of $800.     Review of Systems: Out of a complete 14 system review, the patient complains  of only the following symptoms, and all other reviewed systems are negative.  pre CPAP ;GDS 2 , FSS  23 from 36, Epworth  15 from 16   Social History   Socioeconomic History  . Marital status: Married    Spouse name: [name]  . Number of children: 3  . Years of education: 48  .  Highest education level: Not on file  Social Needs  . Financial resource strain: Not on file  . Food insecurity - worry: Not on file  . Food insecurity - inability: Not on file  . Transportation needs - medical: Not on file  . Transportation needs - non-medical: Not on file  Occupational History  . Occupation: Musician  Tobacco Use  . Smoking status: Former Smoker    Packs/day: 2.00    Years: 15.00    Pack years: 30.00    Types: Cigarettes    Last attempt to quit: 07/26/1998    Years since quitting: 19.1  . Smokeless tobacco: Never Used  Substance and Sexual Activity  . Alcohol use: No    Alcohol/week: 0.0 oz  . Drug use: No  . Sexual activity: Yes  Other Topics Concern  . Not on file  Social History Narrative   Patient is married [name]) and lives at home with his wife.   Patient has three adult children.   Patient is retired.   Patient is a Research officer, political party.   Patient is right-handed.   Patient drinks one cup of coffee daily and 2-3 sodas per day.       Family History  Problem Relation Age of Onset  . Hypertension Mother   . Kidney disease Mother   . Diabetes Mother   . Hypertension Father   . Cancer Brother        lung  . Stroke Brother   . Colon cancer Neg Hx     Past Medical History:  Diagnosis Date  . Arthritis    "knees" (07/11/2017)  . CKD (chronic kidney disease), stage III ([place])   . Coronary artery disease    a.  CABGx3 in 05/2017.  Marland Kitchen Gout   . Heart murmur   . High cholesterol   . Hypertension   . Mild aortic stenosis   . Mild mitral regurgitation   . OSA on CPAP   . Postoperative atrial fibrillation ([place]) 06/2017  . Type II diabetes mellitus ([place])     Past Surgical  History:  Procedure Laterality Date  . CARDIAC CATHETERIZATION    . COLONOSCOPY  2008  . CORONARY ARTERY BYPASS GRAFT N/A 06/24/2017   Procedure: CORONARY ARTERY BYPASS GRAFTING (CABG) x 3 ; Using Left Internal Mammary Artery and Right Great Saphenous Leg Vein Harvested Endoscopically;  Surgeon: Gaye Pollack, MD;  Location: [facility];  Service: Open Heart Surgery;  Laterality: N/A;  . EYE SURGERY    . LEFT HEART CATH AND CORONARY ANGIOGRAPHY N/A 05/25/2017   Procedure: LEFT HEART CATH AND CORONARY ANGIOGRAPHY;  Surgeon: Wellington Hampshire, MD;  Location: [facility];  Service: Cardiovascular;  Laterality: N/A;  . TEE WITHOUT CARDIOVERSION N/A 06/24/2017   Procedure: TRANSESOPHAGEAL ECHOCARDIOGRAM (TEE);  Surgeon: Gaye Pollack, MD;  Location: [facility];  Service: Open Heart Surgery;  Laterality: N/A;    Current Outpatient Medications  Medication Sig Dispense Refill  . amLODipine (NORVASC) 10 MG tablet Take 1 tablet (10 mg total) by mouth daily. 90 tablet 1  . aspirin EC 81 MG tablet Take 1 tablet (81 mg total) by mouth daily. 90 tablet 3  . atorvastatin (LIPITOR) 80 MG tablet Take 1 tablet (80 mg total) by mouth daily at 6 PM. 90 tablet 1  . colchicine 0.6 MG tablet Take 1-2 tablets (0.6-1.2 mg total) by mouth daily as needed (for gout flare). 2 po at onset of flair and then repeat in 1 h  for acute gout attack then 1 po qd until pain free 30 tablet 0  . metoprolol tartrate (LOPRESSOR) 50 MG tablet Take 1 tablet (50 mg total) by mouth 2 (two) times daily. 180 tablet 1   No current facility-administered medications for this visit.     Allergies as of 09/07/2017 - Review Complete 09/07/2017  Allergen Reaction Noted  . Penicillins Other (See Comments) 08/12/2011  . Lisinopril Cough 08/12/2011  . Amiodarone Nausea And Vomiting 07/11/2017  . Oxycodone Other (See Comments) 07/01/2017  . Zofran [ondansetron hcl]  07/07/2017  . Cortisone Nausea And Vomiting and Other (See Comments)  05/26/2015  . Tramadol Nausea And Vomiting and Other (See Comments) 02/04/2014    Vitals: BP (!) 145/84   Pulse 70   Ht 5\' 3"  (1.6 m)   Wt 182 lb (82.6 kg)   BMI 32.24 kg/m  Last Weight:  Wt Readings from Last 1 Encounters:  09/07/17 182 lb (82.6 kg)   Last Height:   Ht Readings from Last 1 Encounters:  09/07/17 5\' 3"  (1.6 m)     Physical exam:  General: The patient is awake, alert and appears not in acute distress. The patient is well groomed. Head: Normocephalic, atraumatic. Neck is supple. Mallampati 3, neck circumference: 18.25 inches. He has clear retrognathia with overbite.  Cardiovascular:  Regular rate and rhythm.Marland Kitchen Respiratory: Lungs are clear to auscultation. Chest tightness reported.  Skin:  Without evidence of edema, or rash Trunk: BMI is elevated /normal posture.  Neurologic exam : The patient is awake and alert, oriented to place and time.  Memory subjective  described as intact. There is a normal attention span & concentration ability.  Speech is fluent without dysarthria, dysphonia or aphasia.  Cranial nerves:  taste and smell sensitivity were reduced.  Pupils are equal and briskly reactive to light.  Visual fields by finger perimetry are intact. Hearing normal.  Facial sensation intact to fine touch.Facial motor strength is symmetric and tongue and uvula move midline.  Motor exam:  Normal tone , muscle bulk and symmetric normal strength in all extremities.  Actually preserved grip strength,  DTR symmetric, no sensory loss.  No recent falls, steps his gait is width and step length. Left knee pain- Baker's Cyst.   Assessment:  After physical and neurologic examination, review of laboratory studies, imaging, neurophysiology testing and pre-existing records, assessment is   1) patient with a Re-newed diagnosis of severe obstructive sleep apnea in 2015, now treated on CPAP 11 cm water.  If Mr. Denbleyker wants to resume CPAP treatment order supplies he would  need to document compliance.  This cannot be done as he has found it intolerable to use CPAP.  Based on this I have a 3-1/35-month.  Of noncompliance.  I will have to order either a home sleep test or a re-titration for the patient in office.  Plan based on his titration data I can readjust the pressures on his current CPAP machine which is still well working.   He feels that the pressures are a problem-  not humidity, temperature or the interface.  He owns a so- clean machine.    Plan:  Treatment plan and additional workup : SPLIT at AHI 15 /hr, this study needs to document the presence of OSA after Bypass surgery and re-itrate to a tolerable pressure. His current CPAP can be reset and is barely used.  [facility]. It is also possible that after bypass surgery with improvement in cardiac output a CPAP therapy may not  longer be necessary.   Larey Seat, MD   CC : Dr [name], MD CC; Ottie Glazier, MD

## 2017-09-07 NOTE — Telephone Encounter (Signed)
Called the patient and made him aware that we have not seen him in the practice since 2016 and that we would need to evaluate him and see his download of his machine to see what adjustments would need to be made. Pt verbalized understanding. We had a cancellation today at 2 pm and I was able to work him in to be seen for this.

## 2017-09-12 ENCOUNTER — Encounter (HOSPITAL_COMMUNITY)
Admission: RE | Admit: 2017-09-12 | Discharge: 2017-09-12 | Disposition: A | Discharge status: Home / Self Care | Payer: Medicare Other | Source: Ambulatory Visit | Attending: Cardiology | Admitting: Cardiology

## 2017-09-12 DIAGNOSIS — Z7984 Long term (current) use of oral hypoglycemic drugs: Secondary | ICD-10-CM | POA: Diagnosis not present

## 2017-09-12 DIAGNOSIS — Z951 Presence of aortocoronary bypass graft: Secondary | ICD-10-CM | POA: Diagnosis not present

## 2017-09-12 DIAGNOSIS — Z7982 Long term (current) use of aspirin: Secondary | ICD-10-CM | POA: Diagnosis not present

## 2017-09-12 DIAGNOSIS — Z79899 Other long term (current) drug therapy: Secondary | ICD-10-CM | POA: Diagnosis not present

## 2017-09-12 LAB — GLUCOSE, CAPILLARY
Glucose-Capillary: 209 mg/dL — ABNORMAL HIGH (ref 65–99)
Glucose-Capillary: 228 mg/dL — ABNORMAL HIGH (ref 65–99)

## 2017-09-12 NOTE — Progress Notes (Signed)
Daily Session Note  Patient Details  Name: Antonio Wells MRN: [id number] Date of Birth: 07/12/1946 Referring Provider:     CARDIAC REHAB PHASE II ORIENTATION from 09/06/2017 in [facility]  Referring Provider  Nelson, Katarina, MD.      Encounter Date: 09/12/2017  Check In: Session Check In - 09/12/17 1016      Check-In   Location  [facility]    Staff Present  Amber Fair, MS, ACSM RCEP, Exercise Physiologist;Tyara Nevels, MS,ACSM CEP, Exercise Physiologist;Maria Whitaker, RN, BSN;Olinty Richards, MS, ACSM CEP, Exercise Physiologist    Supervising physician immediately available to respond to emergencies  [facility] immediately available    Physician(s)   Dr. Regalado    Medication changes reported      No    Fall or balance concerns reported     No    Tobacco Cessation  No Change    Warm-up and Cool-down  Performed as group-led instruction    Resistance Training Performed  Yes    VAD Patient?  No      Pain Assessment   Currently in Pain?  No/denies    Multiple Pain Sites  No       Capillary Blood Glucose: Results for orders placed or performed during the hospital encounter of 09/12/17 (from the past 24 hour(s))  Glucose, capillary     Status: Abnormal   Collection Time: 09/12/17  9:54 AM  Result Value Ref Range   Glucose-Capillary 209 (H) 65 - 99 mg/dL  Glucose, capillary     Status: Abnormal   Collection Time: 09/12/17 10:49 AM  Result Value Ref Range   Glucose-Capillary 228 (H) 65 - 99 mg/dL      Social History   Tobacco Use  Smoking Status Former Smoker  . Packs/day: 2.00  . Years: 15.00  . Pack years: 30.00  . Types: Cigarettes  . Last attempt to quit: 07/26/1998  . Years since quitting: 19.1  Smokeless Tobacco Never Used    Goals Met:  Exercise tolerated well  Goals Unmet:  Not Applicable  Comments: Johnny started cardiac rehab today.  Pt tolerated light exercise without difficulty. VSS,  telemetry-Sinus Rhtyhm, asymptomatic.  Medication list reconciled. Pt denies barriers to medicaiton compliance.  PSYCHOSOCIAL ASSESSMENT:  PHQ-0. Pt exhibits positive coping skills, hopeful outlook with supportive family. No psychosocial needs identified at this time, no psychosocial interventions necessary.    Pt enjoys playing the saxophone and travelling .   Pt oriented to exercise equipment and routine.    Understanding verbalized. Duayne's pre and post exercise CBG's were somewhat elevated. Will notify his primary care provider Dr Jeffrey Green ans Ronal was on metformin before his hospitalization. Bralin say he has a follow up appointment with Dr Green on March 4th.Maria Whitaker, RN,BSN 09/13/2017 10:00 AM   Dr. Traci Turner is Medical Director for Cardiac Rehab at [facility]. 

## 2017-09-12 NOTE — Telephone Encounter (Signed)
Spoke with pt re: reducing the ASA dose to 81 mg and that it was ok for him to return to swimming when he felt he was ready.  Pt thanked me for the call.

## 2017-09-13 NOTE — Progress Notes (Signed)
Cardiac Individual Treatment Plan  Patient Details  Name: Antonio Wells MRN: [id number] Date of Birth: 09-Sep-1945 Referring Provider:     CARDIAC REHAB PHASE II ORIENTATION from 09/06/2017 in [place]  Referring Provider  Ena Dawley, MD.      Initial Encounter Date:    CARDIAC REHAB PHASE II ORIENTATION from 09/06/2017 in [facility]  Date  09/06/17  Referring Provider  Ena Dawley, MD.      Visit Diagnosis: S/P CABG (coronary artery bypass graft)  Patient's Home Medications on Admission:  Current Outpatient Medications:  .  amLODipine (NORVASC) 10 MG tablet, Take 1 tablet (10 mg total) by mouth daily., Disp: 90 tablet, Rfl: 1 .  aspirin EC 81 MG tablet, Take 1 tablet (81 mg total) by mouth daily., Disp: 90 tablet, Rfl: 3 .  atorvastatin (LIPITOR) 80 MG tablet, Take 1 tablet (80 mg total) by mouth daily at 6 PM., Disp: 90 tablet, Rfl: 1 .  colchicine 0.6 MG tablet, Take 1-2 tablets (0.6-1.2 mg total) by mouth daily as needed (for gout flare). 2 po at onset of flair and then repeat in 1 h for acute gout attack then 1 po qd until pain free, Disp: 30 tablet, Rfl: 0 .  metoprolol tartrate (LOPRESSOR) 50 MG tablet, Take 1 tablet (50 mg total) by mouth 2 (two) times daily., Disp: 180 tablet, Rfl: 1 .  metFORMIN (GLUCOPHAGE) 500 MG tablet, Take 500 mg by mouth 2 (two) times daily with a meal., Disp: , Rfl:   Past Medical History: Past Medical History:  Diagnosis Date  . Arthritis    "knees" (07/11/2017)  . CKD (chronic kidney disease), stage III ([place])   . Coronary artery disease    a.  CABGx3 in 05/2017.  [facility] Gout   . Heart murmur   . High cholesterol   . Hypertension   . Mild aortic stenosis   . Mild mitral regurgitation   . OSA on CPAP   . Postoperative atrial fibrillation ([place]) 06/2017  . Type II diabetes mellitus ([facility])     Tobacco Use: Social History   Tobacco Use  Smoking Status Former Smoker   . Packs/day: 2.00  . Years: 15.00  . Pack years: 30.00  . Types: Cigarettes  . Last attempt to quit: 07/26/1998  . Years since quitting: 19.1  Smokeless Tobacco Never Used    Labs: Recent Review Flowsheet Data    Labs for ITP Cardiac and Pulmonary Rehab Latest Ref Rng & Units 06/24/2017 06/24/2017 06/24/2017 06/24/2017 06/25/2017   Cholestrol 100 - 199 mg/dL - - - - -   LDLCALC 0 - 99 mg/dL - - - - -   HDL >39 mg/dL - - - - -   Trlycerides 0 - 149 mg/dL - - - - -   Hemoglobin A1c 4.8 - 5.6 % - - - - -   PHART 7.350 - 7.450 7.433 7.374 - 7.361 -   PCO2ART 32.0 - 48.0 mmHg 38.1 45.1 - 46.4 -   HCO3 20.0 - 28.0 mmol/L 25.7 26.2 - 26.2 -   TCO2 22 - 32 mmol/L 27 28 24 28 24    O2SAT % 98.0 93.0 - 94.0 -      Capillary Blood Glucose: Lab Results  Component Value Date   GLUCAP 142 (H) 09/14/2017   GLUCAP 137 (H) 09/14/2017   GLUCAP 228 (H) 09/12/2017   GLUCAP 209 (H) 09/12/2017   GLUCAP 118 (H) 07/14/2017  Exercise Target Goals:    Exercise Program Goal: Individual exercise prescription set using results from initial 6 min walk test and THRR while considering  patient's activity barriers and safety.   Exercise Prescription Goal: Initial exercise prescription builds to 30-45 minutes a day of aerobic activity, 2-3 days per week.  Home exercise guidelines will be given to patient during program as part of exercise prescription that the participant will acknowledge.  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 Right knee pain. Bilateral hip pain with walking.    Cardiac Risk Stratification  High       6 Minute Walk: 6 Minute Walk    Row Name 09/06/17 1146         6 Minute Walk   Phase  Initial     Distance  839 feet     Walk Time  6 minutes     # of  Rest Breaks  0     MPH  1.59     METS  1.87     RPE  13     VO2 Peak  6.54     Symptoms  Yes (comment)     Comments  Patient c/o knee pain, 6/10 on the pain scale.     Resting HR  72 bpm     Resting BP  120/80     Resting Oxygen Saturation   98 %     Exercise Oxygen Saturation  during 6 min walk  99 %     Max Ex. HR  89 bpm     Max Ex. BP  160/78     2 Minute Post BP  120/80        Oxygen Initial Assessment:   Oxygen Re-Evaluation:   Oxygen Discharge (Final Oxygen Re-Evaluation):   Initial Exercise Prescription: Initial Exercise Prescription - 09/06/17 1100      Date of Initial Exercise RX and Referring Provider   Date  09/06/17    Referring Provider  Ena Dawley, MD.      Recumbant Bike   Level  3    Minutes  10    METs  2      NuStep   Level  3    SPM  85    Minutes  10    METs  2      Arm Ergometer   Level  3    RPM  30    Minutes  10      Prescription Details   Frequency (times per week)  3    Duration  Progress to 30 minutes of continuous aerobic without signs/symptoms of physical distress      Intensity   THRR 40-80% of Max Heartrate  60-120    Ratings of Perceived Exertion  11-13    Perceived Dyspnea  --      Progression   Progression  Continue to progress workloads to maintain intensity without signs/symptoms of physical distress.      Resistance Training   Training Prescription  Yes    Weight  3lbs.    Reps  10-15       Perform Capillary Blood Glucose checks as needed.  Exercise Prescription Changes:  Exercise Prescription Changes    Row Name 09/12/17 [phone]  Response to Exercise   Blood Pressure (Admit)  128/80       Blood Pressure (Exercise)  148/74       Blood Pressure (Exit)  122/80       Heart Rate (Admit)  88 bpm       Heart Rate (Exercise)  88 bpm       Heart Rate (Exit)  75 bpm       Rating of Perceived Exertion (Exercise)  13       Symptoms  none       Duration  Continue with 30 min of aerobic  exercise without signs/symptoms of physical distress.       Intensity  THRR unchanged         Progression   Progression  Continue to progress workloads to maintain intensity without signs/symptoms of physical distress.       Average METs  1.9         Resistance Training   Training Prescription  Yes       Weight  3lbs.       Reps  10-15       Time  10 Minutes         Interval Training   Interval Training  No         Recumbant Bike   Level  3       Minutes  10       METs  1.5         NuStep   Level  3       SPM  85       Minutes  10       METs  2.3         Arm Ergometer   Level  3       RPM  30       Minutes  10         Home Exercise Plan   Plans to continue exercise at  [facility] (comment)          Exercise Comments:  Exercise Comments    Row Name 09/12/17 0950           Exercise Comments  Off to a good start with exercise.          Exercise Goals and Review:  Exercise Goals    Row Name 09/06/17 1152             Exercise Goals   Increase Physical Activity  Yes       Intervention  Provide advice, education, support and counseling about physical activity/exercise needs.;Develop an individualized exercise prescription for aerobic and resistive training based on initial evaluation findings, risk stratification, comorbidities and participant's personal goals.       Expected Outcomes  Long Term: Exercising regularly at least 3-5 days a week.;Long Term: Add in home exercise to make exercise part of routine and to increase amount of physical activity.       Increase Strength and Stamina  Yes       Intervention  Provide advice, education, support and counseling about physical activity/exercise needs.;Develop an individualized exercise prescription for aerobic and resistive training based on initial evaluation findings, risk stratification, comorbidities and participant's personal goals.       Expected Outcomes  Short Term: Increase workloads from initial  exercise prescription for resistance, speed, and METs.;Short Term: Perform resistance training exercises routinely during rehab and add in resistance training at home;Long Term: Improve cardiorespiratory fitness, muscular  endurance and strength as measured by increased METs and functional capacity (6MWT)       Able to understand and use rate of perceived exertion (RPE) scale  Yes       Intervention  Provide education and explanation on how to use RPE scale       Expected Outcomes  Short Term: Able to use RPE daily in rehab to express subjective intensity level;Long Term:  Able to use RPE to guide intensity level when exercising independently       Knowledge and understanding of Target Heart Rate Range (THRR)  Yes       Intervention  Provide education and explanation of THRR including how the numbers were predicted and where they are located for reference       Expected Outcomes  Short Term: Able to state/look up THRR;Long Term: Able to use THRR to govern intensity when exercising independently;Short Term: Able to use daily as guideline for intensity in rehab       Able to check pulse independently  Yes       Intervention  Provide education and demonstration on how to check pulse in carotid and radial arteries.;Review the importance of being able to check your own pulse for safety during independent exercise       Expected Outcomes  Short Term: Able to explain why pulse checking is important during independent exercise;Long Term: Able to check pulse independently and accurately       Understanding of Exercise Prescription  Yes       Intervention  Provide education, explanation, and written materials on patient's individual exercise prescription       Expected Outcomes  Short Term: Able to explain program exercise prescription;Long Term: Able to explain home exercise prescription to exercise independently          Exercise Goals Re-Evaluation : Exercise Goals Re-Evaluation    Row Name 09/12/17 1408              Exercise Goal Re-Evaluation   Exercise Goals Review  Able to understand and use rate of perceived exertion (RPE) scale       Comments  Patient tolerated first full day of exercise well without c/o. Pt able to use RPE scale appropriately.       Expected Outcomes  Increase workloads as tolerated to achieve health and fitness goals.           Discharge Exercise Prescription (Final Exercise Prescription Changes): Exercise Prescription Changes - 09/12/17 0950      Response to Exercise   Blood Pressure (Admit)  128/80    Blood Pressure (Exercise)  148/74    Blood Pressure (Exit)  122/80    Heart Rate (Admit)  88 bpm    Heart Rate (Exercise)  88 bpm    Heart Rate (Exit)  75 bpm    Rating of Perceived Exertion (Exercise)  13    Symptoms  none    Duration  Continue with 30 min of aerobic exercise without signs/symptoms of physical distress.    Intensity  THRR unchanged      Progression   Progression  Continue to progress workloads to maintain intensity without signs/symptoms of physical distress.    Average METs  1.9      Resistance Training   Training Prescription  Yes    Weight  3lbs.    Reps  10-15    Time  10 Minutes      Interval Training   Interval Training  No  Recumbant Bike   Level  3    Minutes  10    METs  1.5      NuStep   Level  3    SPM  85    Minutes  10    METs  2.3      Arm Ergometer   Level  3    RPM  30    Minutes  10      Home Exercise Plan   Plans to continue exercise at  [facility] (comment)       Nutrition:  Target Goals: Understanding of nutrition guidelines, daily intake of sodium 1500mg , cholesterol 200mg , calories 30% from fat and 7% or less from saturated fats, daily to have 5 or more servings of fruits and vegetables.  Biometrics: Pre Biometrics - 09/06/17 1153      Pre Biometrics   Height  5\' 3"  (1.6 m)    Weight  181 lb 7 oz (82.3 kg)    Waist Circumference  39.75 inches    Hip Circumference   40.25 inches    Waist to Hip Ratio  0.99 %    BMI (Calculated)  32.15    Triceps Skinfold  15 mm    % Body Fat  29.8 %    Grip Strength  41.5 kg    Flexibility  14 in    Single Leg Stand  30 seconds        Nutrition Therapy Plan and Nutrition Goals: Nutrition Therapy & Goals - 09/06/17 1534      Nutrition Therapy   Diet  Carb Modified, Heart Healthy      Personal Nutrition Goals   Nutrition Goal  Pt to identify food quantities necessary to achieve weight loss of 6-20lb at graduation from cardiac rehab. Goal wt of 160 lb desired.       Intervention Plan   Intervention  Prescribe, educate and counsel regarding individualized specific dietary modifications aiming towards targeted core components such as weight, hypertension, lipid management, diabetes, heart failure and other comorbidities.    Expected Outcomes  Short Term Goal: Understand basic principles of dietary content, such as calories, fat, sodium, cholesterol and nutrients.;Long Term Goal: Adherence to prescribed nutrition plan.       Nutrition Assessments:   Nutrition Goals Re-Evaluation:   Nutrition Goals Re-Evaluation:   Nutrition Goals Discharge (Final Nutrition Goals Re-Evaluation):   Psychosocial: Target Goals: Acknowledge presence or absence of significant depression and/or stress, maximize coping skills, provide positive support system. Participant is able to verbalize types and ability to use techniques and skills needed for reducing stress and depression.  Initial Review & Psychosocial Screening: Initial Psych Review & Screening - 09/06/17 1208      Initial Review   Current issues with  None Identified      Family Dynamics   Good Support System?  Yes wife present during orientation    Comments  no psychosocial needsd identified, no interventions necessary       Barriers   Psychosocial barriers to participate in program  There are no identifiable barriers or psychosocial needs.      Screening  Interventions   Interventions  Encouraged to exercise       Quality of Life Scores: Quality of Life - 09/12/17 1406      Quality of Life Scores   Health/Function Pre  23.53 %    Socioeconomic Pre  25.33 %    Psych/Spiritual Pre  28.29 %    Family Pre  28.8 %    GLOBAL Pre  25.67 %      Scores of 19 and below usually indicate a poorer quality of life in these areas.  A difference of  2-3 points is a clinically meaningful difference.  A difference of 2-3 points in the total score of the Quality of Life Index has been associated with significant improvement in overall quality of life, self-image, physical symptoms, and general health in studies assessing change in quality of life.  PHQ-9: Recent Review Flowsheet Data    Depression screen [facility] 2/9 08/16/2017 07/30/2017 07/11/2017 07/07/2017 05/12/2017   Decreased Interest 0 0 0 0 0   Down, Depressed, Hopeless 0 0 0 0 0   PHQ - 2 Score 0 0 0 0 0     Interpretation of Total Score  Total Score Depression Severity:  1-4 = Minimal depression, 5-9 = Mild depression, 10-14 = Moderate depression, 15-19 = Moderately severe depression, 20-27 = Severe depression   Psychosocial Evaluation and Intervention:   Psychosocial Re-Evaluation:   Psychosocial Discharge (Final Psychosocial Re-Evaluation):   Vocational Rehabilitation: Provide vocational rehab assistance to qualifying candidates.   Vocational Rehab Evaluation & Intervention:   Education: Education Goals: Education classes will be provided on a weekly basis, covering required topics. Participant will state understanding/return demonstration of topics presented.  Learning Barriers/Preferences: Learning Barriers/Preferences - 09/06/17 1155      Learning Barriers/Preferences   Learning Barriers  None    Learning Preferences  None       Education Topics: Count Your Pulse:  -Group instruction provided by verbal instruction, demonstration, patient participation and written  materials to support subject.  Instructors address importance of being able to find your pulse and how to count your pulse when at home without a heart monitor.  Patients get hands on experience counting their pulse with staff help and individually.   Heart Attack, Angina, and Risk Factor Modification:  -Group instruction provided by verbal instruction, video, and written materials to support subject.  Instructors address signs and symptoms of angina and heart attacks.    Also discuss risk factors for heart disease and how to make changes to improve heart health risk factors.   Functional Fitness:  -Group instruction provided by verbal instruction, demonstration, patient participation, and written materials to support subject.  Instructors address safety measures for doing things around the house.  Discuss how to get up and down off the floor, how to pick things up properly, how to safely get out of a chair without assistance, and balance training.   Meditation and Mindfulness:  -Group instruction provided by verbal instruction, patient participation, and written materials to support subject.  Instructor addresses importance of mindfulness and meditation practice to help reduce stress and improve awareness.  Instructor also leads participants through a meditation exercise.    Stretching for Flexibility and Mobility:  -Group instruction provided by verbal instruction, patient participation, and written materials to support subject.  Instructors lead participants through series of stretches that are designed to increase flexibility thus improving mobility.  These stretches are additional exercise for major muscle groups that are typically performed during regular warm up and cool down.   Hands Only CPR:  -Group verbal, video, and participation provides a basic overview of AHA guidelines for community CPR. Role-play of emergencies allow participants the opportunity to practice calling for help and  chest compression technique with discussion of AED use.   Hypertension: -Group verbal and written instruction that provides a basic overview of hypertension  including the most recent diagnostic guidelines, risk factor reduction with self-care instructions and medication management.    Nutrition I class: Heart Healthy Eating:  -Group instruction provided by PowerPoint slides, verbal discussion, and written materials to support subject matter. The instructor gives an explanation and review of the Therapeutic Lifestyle Changes diet recommendations, which includes a discussion on lipid goals, dietary fat, sodium, fiber, plant stanol/sterol esters, sugar, and the components of a well-balanced, healthy diet.   Nutrition II class: Lifestyle Skills:  -Group instruction provided by PowerPoint slides, verbal discussion, and written materials to support subject matter. The instructor gives an explanation and review of label reading, grocery shopping for heart health, heart healthy recipe modifications, and ways to make healthier choices when eating out.   Diabetes Question & Answer:  -Group instruction provided by PowerPoint slides, verbal discussion, and written materials to support subject matter. The instructor gives an explanation and review of diabetes co-morbidities, pre- and post-prandial blood glucose goals, pre-exercise blood glucose goals, signs, symptoms, and treatment of hypoglycemia and hyperglycemia, and foot care basics.   Diabetes Blitz:  -Group instruction provided by PowerPoint slides, verbal discussion, and written materials to support subject matter. The instructor gives an explanation and review of the physiology behind type 1 and type 2 diabetes, diabetes medications and rational behind using different medications, pre- and post-prandial blood glucose recommendations and Hemoglobin A1c goals, diabetes diet, and exercise including blood glucose guidelines for exercising safely.     Portion Distortion:  -Group instruction provided by PowerPoint slides, verbal discussion, written materials, and food models to support subject matter. The instructor gives an explanation of serving size versus portion size, changes in portions sizes over the last 20 years, and what consists of a serving from each food group.   Stress Management:  -Group instruction provided by verbal instruction, video, and written materials to support subject matter.  Instructors review role of stress in heart disease and how to cope with stress positively.     Exercising on Your Own:  -Group instruction provided by verbal instruction, power point, and written materials to support subject.  Instructors discuss benefits of exercise, components of exercise, frequency and intensity of exercise, and end points for exercise.  Also discuss use of nitroglycerin and activating EMS.  Review options of places to exercise outside of rehab.  Review guidelines for sex with heart disease.   Cardiac Drugs I:  -Group instruction provided by verbal instruction and written materials to support subject.  Instructor reviews cardiac drug classes: antiplatelets, anticoagulants, beta blockers, and statins.  Instructor discusses reasons, side effects, and lifestyle considerations for each drug class.   Cardiac Drugs II:  -Group instruction provided by verbal instruction and written materials to support subject.  Instructor reviews cardiac drug classes: angiotensin converting enzyme inhibitors (ACE-I), angiotensin II receptor blockers (ARBs), nitrates, and calcium channel blockers.  Instructor discusses reasons, side effects, and lifestyle considerations for each drug class.   Anatomy and Physiology of the Circulatory System:  Group verbal and written instruction and models provide basic cardiac anatomy and physiology, with the coronary electrical and arterial systems. Review of: AMI, Angina, Valve disease, Heart Failure,  Peripheral Artery Disease, Cardiac Arrhythmia, Pacemakers, and the ICD.   Other Education:  -Group or individual verbal, written, or video instructions that support the educational goals of the cardiac rehab program.   Holiday Eating Survival Tips:  -Group instruction provided by PowerPoint slides, verbal discussion, and written materials to support subject matter. The instructor gives patients tips, tricks,  and techniques to help them not only survive but enjoy the holidays despite the onslaught of food that accompanies the holidays.   Knowledge Questionnaire Score: Knowledge Questionnaire Score - 09/12/17 1406      Knowledge Questionnaire Score   Pre Score  20/24       Core Components/Risk Factors/Patient Goals at Admission: Personal Goals and Risk Factors at Admission - 09/06/17 1141      Core Components/Risk Factors/Patient Goals on Admission    Weight Management  Yes;Obesity    Intervention  Weight Management/Obesity: Establish reasonable short term and long term weight goals.;Obesity: Provide education and appropriate resources to help participant work on and attain dietary goals.    Admit Weight  181 lb 7 oz (82.3 kg)    Goal Weight: Short Term  175 lb (79.4 kg)    Goal Weight: Long Term  160 lb (72.6 kg)    Expected Outcomes  Short Term: Continue to assess and modify interventions until short term weight is achieved;Long Term: Adherence to nutrition and physical activity/exercise program aimed toward attainment of established weight goal;Weight Loss: Understanding of general recommendations for a balanced deficit meal plan, which promotes 1-2 lb weight loss per week and includes a negative energy balance of [id number] kcal/d    Diabetes  Yes    Intervention  Provide education about signs/symptoms and action to take for hypo/hyperglycemia.;Provide education about proper nutrition, including hydration, and aerobic/resistive exercise prescription along with prescribed medications to  achieve blood glucose in normal ranges: Fasting glucose 65-99 mg/dL    Expected Outcomes  Short Term: Participant verbalizes understanding of the signs/symptoms and immediate care of hyper/hypoglycemia, proper foot care and importance of medication, aerobic/resistive exercise and nutrition plan for blood glucose control.;Long Term: Attainment of HbA1C < 7%.    Hypertension  Yes    Intervention  Provide education on lifestyle modifcations including regular physical activity/exercise, weight management, moderate sodium restriction and increased consumption of fresh fruit, vegetables, and low fat dairy, alcohol moderation, and smoking cessation.;Monitor prescription use compliance.    Expected Outcomes  Short Term: Continued assessment and intervention until BP is < 140/63mm HG in hypertensive participants. < 130/48mm HG in hypertensive participants with diabetes, heart failure or chronic kidney disease.;Long Term: Maintenance of blood pressure at goal levels.    Lipids  Yes    Intervention  Provide education and support for participant on nutrition & aerobic/resistive exercise along with prescribed medications to achieve LDL 70mg , HDL >40mg .    Expected Outcomes  Short Term: Participant states understanding of desired cholesterol values and is compliant with medications prescribed. Participant is following exercise prescription and nutrition guidelines.;Long Term: Cholesterol controlled with medications as prescribed, with individualized exercise RX and with personalized nutrition plan. Value goals: LDL < 70mg , HDL > 40 mg.       Core Components/Risk Factors/Patient Goals Review:    Core Components/Risk Factors/Patient Goals at Discharge (Final Review):    ITP Comments: ITP Comments    Row Name 09/06/17 0919 09/13/17 1542         ITP Comments  Dr. Fransico Him, Medical Director   30 Day ITP Review. Patient is off to a good start to exercise this week.         Comments: See ITP  comments.Barnet Pall, RN,BSN 09/14/2017 4:58 PM

## 2017-09-14 ENCOUNTER — Telehealth (HOSPITAL_COMMUNITY): Payer: Self-pay | Admitting: *Deleted

## 2017-09-14 ENCOUNTER — Encounter (HOSPITAL_COMMUNITY)
Admission: RE | Admit: 2017-09-14 | Discharge: 2017-09-14 | Disposition: A | Discharge status: Home / Self Care | Payer: Medicare Other | Source: Ambulatory Visit | Attending: Cardiology | Admitting: Cardiology

## 2017-09-14 DIAGNOSIS — Z951 Presence of aortocoronary bypass graft: Secondary | ICD-10-CM | POA: Diagnosis not present

## 2017-09-14 DIAGNOSIS — Z79899 Other long term (current) drug therapy: Secondary | ICD-10-CM | POA: Diagnosis not present

## 2017-09-14 DIAGNOSIS — Z7982 Long term (current) use of aspirin: Secondary | ICD-10-CM | POA: Diagnosis not present

## 2017-09-14 DIAGNOSIS — Z7984 Long term (current) use of oral hypoglycemic drugs: Secondary | ICD-10-CM | POA: Diagnosis not present

## 2017-09-14 LAB — GLUCOSE, CAPILLARY
Glucose-Capillary: 137 mg/dL — ABNORMAL HIGH (ref 65–99)
Glucose-Capillary: 142 mg/dL — ABNORMAL HIGH (ref 65–99)

## 2017-09-14 NOTE — Telephone Encounter (Signed)
-----   Message from [name], MD sent at 09/14/2017  3:26 PM EST ----- Regarding: RE: CBG's from cardiac rehab Thank you for the heads up. I looked at today's readings that look better. I suspect some of those high readings are due to diet. Meeting with the dietician should help. We can take another look when I see him here in a few weeks. Thank you again for your help.  Dr. Carlota Raspberry  ----- Message ----- From: Magda Kiel, RN Sent: 09/13/2017  10:05 AM To: [name], MD Subject: CBG's from cardiac rehab                       Good morning Dr Carlota Raspberry,  Mr Miller started cardiac rehab per Dr Meda Coffee and did well with exercise yesterday. I just wanted to let you know that Mr Riester's pre exercise CBG was 209. Post exercise CBG was 238.  Mr Clugston was taking metformin 500 mg twice a day prior to his surgery.  He has a follow up appointment with you on March the 5th. Mr Geister did say he had cereal with sugar yesterday. I asked his to be careful about consuming added sugars.  Thanks for you input.  He will meet with the dietitian in the near future.  Sincerely,  Barnet Pall RN  Cardiac Rehab [phone]

## 2017-09-14 NOTE — Progress Notes (Signed)
Antonio Wells told me that he is taking metformin 500 mg twice a day. CBG's improved today. Will continue to monitor the patient throughout  the program. Medication added to his med list.Maria Venetia Maxon, RN,BSN 09/14/2017 2:06 PM

## 2017-09-16 ENCOUNTER — Encounter (HOSPITAL_COMMUNITY)
Admission: RE | Admit: 2017-09-16 | Discharge: 2017-09-16 | Disposition: A | Discharge status: Home / Self Care | Payer: Medicare Other | Source: Ambulatory Visit | Attending: Cardiology | Admitting: Cardiology

## 2017-09-16 DIAGNOSIS — Z951 Presence of aortocoronary bypass graft: Secondary | ICD-10-CM | POA: Diagnosis not present

## 2017-09-16 DIAGNOSIS — Z7982 Long term (current) use of aspirin: Secondary | ICD-10-CM | POA: Diagnosis not present

## 2017-09-16 DIAGNOSIS — Z7984 Long term (current) use of oral hypoglycemic drugs: Secondary | ICD-10-CM | POA: Diagnosis not present

## 2017-09-16 DIAGNOSIS — Z79899 Other long term (current) drug therapy: Secondary | ICD-10-CM | POA: Diagnosis not present

## 2017-09-16 LAB — GLUCOSE, CAPILLARY
Glucose-Capillary: 136 mg/dL — ABNORMAL HIGH (ref 65–99)
Glucose-Capillary: 108 mg/dL — ABNORMAL HIGH (ref 65–99)

## 2017-09-16 NOTE — Progress Notes (Signed)
Reviewed home exercise guidelines with patient including endpoints, temperature precautions, target heart rate and rate of perceived exertion. [facility]-60 minutes as his mode of home exercise. Pt voices understanding of instructions given. Sol Passer, MS, ACSM CEP

## 2017-09-19 ENCOUNTER — Encounter (HOSPITAL_COMMUNITY)
Admission: RE | Admit: 2017-09-19 | Discharge: 2017-09-19 | Disposition: A | Discharge status: Home / Self Care | Payer: Medicare Other | Source: Ambulatory Visit | Attending: Cardiology | Admitting: Cardiology

## 2017-09-19 DIAGNOSIS — Z7984 Long term (current) use of oral hypoglycemic drugs: Secondary | ICD-10-CM | POA: Diagnosis not present

## 2017-09-19 DIAGNOSIS — Z79899 Other long term (current) drug therapy: Secondary | ICD-10-CM | POA: Diagnosis not present

## 2017-09-19 DIAGNOSIS — Z951 Presence of aortocoronary bypass graft: Secondary | ICD-10-CM

## 2017-09-19 DIAGNOSIS — Z7982 Long term (current) use of aspirin: Secondary | ICD-10-CM | POA: Diagnosis not present

## 2017-09-21 ENCOUNTER — Encounter (HOSPITAL_COMMUNITY): Payer: Medicare Other

## 2017-09-23 ENCOUNTER — Encounter (HOSPITAL_COMMUNITY): Payer: Medicare Other

## 2017-09-26 ENCOUNTER — Encounter (HOSPITAL_COMMUNITY)
Admission: RE | Admit: 2017-09-26 | Discharge: 2017-09-26 | Disposition: A | Discharge status: Home / Self Care | Payer: Medicare Other | Source: Ambulatory Visit | Attending: Cardiology | Admitting: Cardiology

## 2017-09-26 DIAGNOSIS — Z7982 Long term (current) use of aspirin: Secondary | ICD-10-CM | POA: Insufficient documentation

## 2017-09-26 DIAGNOSIS — Z79899 Other long term (current) drug therapy: Secondary | ICD-10-CM | POA: Insufficient documentation

## 2017-09-26 DIAGNOSIS — Z7984 Long term (current) use of oral hypoglycemic drugs: Secondary | ICD-10-CM | POA: Insufficient documentation

## 2017-09-26 DIAGNOSIS — Z951 Presence of aortocoronary bypass graft: Secondary | ICD-10-CM | POA: Insufficient documentation

## 2017-09-27 ENCOUNTER — Encounter: Payer: Self-pay | Admitting: Family Medicine

## 2017-09-27 ENCOUNTER — Ambulatory Visit (INDEPENDENT_AMBULATORY_CARE_PROVIDER_SITE_OTHER): Payer: Medicare Other | Admitting: Family Medicine

## 2017-09-27 VITALS — BP 140/72 | HR 77 | Temp 98.0°F | Resp 18 | Ht 63.0 in | Wt 182.4 lb

## 2017-09-27 DIAGNOSIS — E1165 Type 2 diabetes mellitus with hyperglycemia: Secondary | ICD-10-CM

## 2017-09-27 DIAGNOSIS — M25562 Pain in left knee: Secondary | ICD-10-CM | POA: Diagnosis not present

## 2017-09-27 DIAGNOSIS — M25561 Pain in right knee: Secondary | ICD-10-CM

## 2017-09-27 DIAGNOSIS — I2510 Atherosclerotic heart disease of native coronary artery without angina pectoris: Secondary | ICD-10-CM

## 2017-09-27 DIAGNOSIS — N183 Chronic kidney disease, stage 3 unspecified: Secondary | ICD-10-CM

## 2017-09-27 NOTE — Progress Notes (Addendum)
Subjective:  By signing my name below, I, [name], attest that this documentation has been prepared under the direction and in the presence of [name], MD Electronically Signed: Ladene Artist, ED Scribe 09/27/2017 at 10:09 AM.   Patient ID: Antonio Wells, male    DOB: 11-20-1945, 72 y.o.   MRN: [id number]  Chief Complaint  Patient presents with  . Diabetes    Follow up  . Labs Only   HPI Antonio Wells is a 72 y.o. male who presents to Primary Care at [facility] for f/u on DM. Last seen in Jan. H/o CAD with CABG in Nov 2018 with Dr. Cyndia Bent. A-fib followed by cardiology. Intolerant to amiodarone. Suspected post-op anemia that was improving in Jan.  DM Complicated by CKD. Creatinine improved from 1.65 to 1.38 in Jan. eGFR 59. Glucose was elevating so restarted metformin 500 mg bid. Did receive a note from cardiac rehab that he had some elevated readings pre/post exercise in 200s. Planned for meeting with dietitian. Of note prior to bypass he had been on glipizide 10 mg bid in addition to metformin 500 mg bid. Intolerant to higher doses of metformin. Lab Results  Component Value Date   HGBA1C 8.2 (H) 06/20/2017   Pt states that he has been making some dietary changes such as eliminating sodas and does not want to add in any medications at this time. Reports fasting readings ranging 120s-130s, 150-155 after meals. Denies hypoglycemic episodes. Wt Readings from Last 3 Encounters:  09/27/17 182 lb 6.4 oz (82.7 kg)  09/07/17 182 lb (82.6 kg)  09/06/17 181 lb 7 oz (82.3 kg)   Lipid Screening Lab Results  Component Value Date   CHOL 159 05/12/2017   HDL 45 05/12/2017   LDLCALC 81 05/12/2017   TRIG 166 (H) 05/12/2017   CHOLHDL 3.5 05/12/2017  Lipitor 80 mg qd. Denies any side-effects. Pt is not fasting at this visit.  HTN Pt does not check his BP at home but states BP is checked at cardiac rehab. He has only seen it above 140 during/after exercise at cardiac rehab.  Last cardiology visit with Melina Copa, PA-C on 2/8. Denies any symptoms.  Bilateral Knee Pain Pt mentions bilateral knee pain, L worse than R, is beginning to flare-up. He has tried visco without relief. Discussed this with ortho who suggested surgery.  Health Maintenance  Last colonoscopy was 07/2007. Pt has not scheduled a colonoscopy yet; states he is waiting until he feels better.  Patient Active Problem List   Diagnosis Date Noted  . Malnutrition of moderate degree 07/14/2017  . Thrush 07/11/2017  . FTT (failure to thrive) in adult 07/11/2017  . 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 CAD (coronary artery disease) 05/25/2017  . Unstable angina ([place])   . Hypertensive heart disease 07/14/2015  . Lower extremity edema 07/14/2015  . Aortic stenosis 01/09/2015  . OSA on CPAP 12/12/2014  . Hypersomnia with sleep apnea 12/12/2014  . Obstructive sleep apnea (adult) (pediatric) 02/04/2014  . 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 Obesity 02/04/2014  . Mitral regurgitation and aortic stenosis 01/02/2014  . DOE (dyspnea on exertion) 11/26/2013  . Gout 08/12/2011  . Hypertension 08/12/2011  . High cholesterol 08/12/2011  . OSA (obstructive sleep apnea) 08/12/2011  . Diabetes type 2, uncontrolled ([place]) 08/12/2011   Past Medical History:  Diagnosis Date  . Arthritis    "knees" (07/11/2017)  . CKD (chronic kidney disease), stage III ([place])   . Coronary artery disease  a.  CABGx3 in 05/2017.  Marland Kitchen Gout   . Heart murmur   . High cholesterol   . Hypertension   . Mild aortic stenosis   . Mild mitral regurgitation   . OSA on CPAP   . Postoperative atrial fibrillation ([place]) 06/2017  . Type II diabetes mellitus ([place])    Past Surgical History:  Procedure Laterality Date  . CARDIAC CATHETERIZATION    . COLONOSCOPY  2008  . CORONARY ARTERY BYPASS GRAFT N/A 06/24/2017   Procedure: CORONARY ARTERY BYPASS GRAFTING (CABG) x 3 ; Using Left Internal Mammary Artery and Right Great Saphenous Leg Vein  Harvested Endoscopically;  Surgeon: Gaye Pollack, MD;  Location: [place] [facility];  Service: Open Heart Surgery;  Laterality: N/A;  . EYE SURGERY    . LEFT HEART CATH AND CORONARY ANGIOGRAPHY N/A 05/25/2017   Procedure: LEFT HEART CATH AND CORONARY ANGIOGRAPHY;  Surgeon: Wellington Hampshire, MD;  Location: [place] [facility];  Service: Cardiovascular;  Laterality: N/A;  . TEE WITHOUT CARDIOVERSION N/A 06/24/2017   Procedure: TRANSESOPHAGEAL ECHOCARDIOGRAM (TEE);  Surgeon: Gaye Pollack, MD;  Location: [facility];  Service: Open Heart Surgery;  Laterality: N/A;   Allergies  Allergen Reactions  . Penicillins Other (See Comments)    Has patient had a PCN reaction causing immediate rash, facial/tongue/throat swelling, SOB or lightheadedness with hypotension: No Has patient had a PCN reaction causing severe rash involving mucus membranes or skin necrosis: No Has patient had a PCN reaction that required hospitalization: No Has patient had a PCN reaction occurring within the last 10 years: No If all of the above answers are "NO", then may proceed with Cephalosporin use.   Passed out ? SYNCOPE ?  Marland Kitchen Lisinopril Cough  . Amiodarone Nausea And Vomiting  . Oxycodone Other (See Comments)    hallucinations  . Zofran [Ondansetron Hcl]     Has an interaction with another medication pt takes   . Cortisone Nausea And Vomiting and Other (See Comments)    HICCUPS  . Tramadol Nausea And Vomiting and Other (See Comments)    HICCUPS   Prior to Admission medications   Medication Sig Start Date End Date Taking? Authorizing Provider  amLODipine (NORVASC) 10 MG tablet Take 1 tablet (10 mg total) by mouth daily. 05/12/17   [name], MD  aspirin EC 81 MG tablet Take 1 tablet (81 mg total) by mouth daily. 09/02/17   Dunn, Nedra Hai, PA-C  atorvastatin (LIPITOR) 80 MG tablet Take 1 tablet (80 mg total) by mouth daily at 6 PM. 08/16/17   [name], MD  colchicine 0.6 MG tablet Take 1-2 tablets (0.6-1.2 mg total) by  mouth daily as needed (for gout flare). 2 po at onset of flair and then repeat in 1 h for acute gout attack then 1 po qd until pain free 05/31/13   Harvie Heck, PA-C  metFORMIN (GLUCOPHAGE) 500 MG tablet Take 500 mg by mouth 2 (two) times daily with a meal.    [provider]  metoprolol tartrate (LOPRESSOR) 50 MG tablet Take 1 tablet (50 mg total) by mouth 2 (two) times daily. 05/12/17   [name], MD   Social History   Socioeconomic History  . Marital status: Married    Spouse name: [name]  . Number of children: 3  . Years of education: 46  . Highest education level: Not on file  Social Needs  . Financial resource strain: Not on file  . Food insecurity - worry:  Not on file  . Food insecurity - inability: Not on file  . Transportation needs - medical: Not on file  . Transportation needs - non-medical: Not on file  Occupational History  . Occupation: Musician  Tobacco Use  . Smoking status: Former Smoker    Packs/day: 2.00    Years: 15.00    Pack years: 30.00    Types: Cigarettes    Last attempt to quit: 07/26/1998    Years since quitting: 19.1  . Smokeless tobacco: Never Used  Substance and Sexual Activity  . Alcohol use: No    Alcohol/week: 0.0 oz  . Drug use: No  . Sexual activity: Yes  Other Topics Concern  . Not on file  Social History Narrative   Patient is married [name]) and lives at home with his wife.   Patient has three adult children.   Patient is retired.   Patient is a Research officer, political party.   Patient is right-handed.   Patient drinks one cup of coffee daily and 2-3 sodas per day.      Review of Systems  Constitutional: Negative for fatigue and unexpected weight change.  Eyes: Negative for visual disturbance.  Respiratory: Negative for cough, chest tightness and shortness of breath.   Cardiovascular: Negative for chest pain, palpitations and leg swelling.  Gastrointestinal: Negative for abdominal pain and blood in stool.  Musculoskeletal:  Positive for arthralgias.  Neurological: Negative for dizziness, light-headedness and headaches.      Objective:   Physical Exam  Constitutional: He is oriented to person, place, and time. He appears well-developed and well-nourished.  HENT:  Head: Normocephalic and atraumatic.  Eyes: EOM are normal. Pupils are equal, round, and reactive to light.  Neck: No JVD present. Carotid bruit is not present.  Cardiovascular: Normal rate and regular rhythm.  Murmur heard.  Systolic murmur is present with a grade of 2/6. Pulmonary/Chest: Effort normal and breath sounds normal. He has no rales.  Musculoskeletal: He exhibits no edema.  Neurological: He is alert and oriented to person, place, and time.  Skin: Skin is warm and dry.  Psychiatric: He has a normal mood and affect.  Vitals reviewed.  Vitals:   09/27/17 0948 09/27/17 0949  BP: (!) 147/73 140/72  Pulse: 77   Resp: 18   Temp: 98 F (36.7 C)   TempSrc: Oral   SpO2: 98%   Weight: 182 lb 6.4 oz (82.7 kg)   Height: 5' 3"  (1.6 m)       Assessment & Plan:  Antonio Wells is a 72 y.o. male Type 2 diabetes mellitus with hyperglycemia, without long-term current use of insulin (HCC) - Plan: Hemoglobin A1c  -Check A1c, discussed importance of improved glycemic control especially with previous history of CAD, and for CVD prevention.  -Continue statin, metformin same dose for now. Would consider low-dose glipizide if elevated readings  - Monitor blood pressures with goal of less than 130/80 if possible. Advised to contact me if those readings are persistently elevated. Could consider adding ARB as ACE inhibitor allergy was cough. Would want to monitor renal function closely.  CKD (chronic kidney disease) stage 3, GFR 30-59 ml/min (HCC) - Plan: Comprehensive metabolic panel  - Improved on last check. Avoid NSAIDs, repeat CMP. Improved diabetes control to lessen potential progression of nephropathy  Pain in both knees, unspecified  chronicity  -Reports treatment by orthopedics, asked for my opinion on other treatment options, but apparently has not tolerated steroid shots and no improvement with viscosupplementation. Given this  information would ultimately defer to orthopedics for treatment options, and timing of surgery given prior bypass. If surgical clearance needed, I can provide medical clearance, but cardiac clearance would need to be performed by cardiology/cardiothoracic surgeon.   Patient Instructions    I will check A1c again today.Continue metformin same dose for now, but depending on that reading may need to add in SMALL amount of glipizide. Continue cardiac rehab and meeting with nutritionist may be helpful on food choices.    Keep a record of your blood pressures outside of the office and with cardiac rehab.  If consistently over 130, I would recommend med changes. Let me know.    Fasting cholesterol next visit.   You can discuss treatment of knee pain with ortho, but would not recommend other injections at this point.   I would recommend colonoscopy as soon as you are ready as that is currently due.    IF you received an x-ray today, you will receive an invoice from [facility]. Please contact [facility] at [phone] with questions or concerns regarding your invoice.   IF you received labwork today, you will receive an invoice from [facility]. Please contact [facility] at [phone] with questions or concerns regarding your invoice.   Our billing staff will not be able to assist you with questions regarding bills from these companies.  You will be contacted with the lab results as soon as they are available. The fastest way to get your results is to activate your My Chart account. Instructions are located on the last page of this paperwork. If you have not heard from [place] regarding the results in 2 weeks, please contact this office.      I personally performed the services described  in this documentation, which was scribed in my presence. The recorded information has been reviewed and considered for accuracy and completeness, addended by me as needed, and agree with information above.  Signed,   [name], MD Primary [facility] at [place].  09/27/17 11:31 AM

## 2017-09-27 NOTE — Patient Instructions (Addendum)
  I will check A1c again today.Continue metformin same dose for now, but depending on that reading may need to add in SMALL amount of glipizide. Continue cardiac rehab and meeting with nutritionist may be helpful on food choices.    Keep a record of your blood pressures outside of the office and with cardiac rehab.  If consistently over 130, I would recommend med changes. Let me know.    Fasting cholesterol next visit.   You can discuss treatment of knee pain with ortho, but would not recommend other injections at this point.   I would recommend colonoscopy as soon as you are ready as that is currently due.    IF you received an x-ray today, you will receive an invoice from [facility]. Please contact [facility] at [phone] with questions or concerns regarding your invoice.   IF you received labwork today, you will receive an invoice from [place]. Please contact [facility] at [phone] with questions or concerns regarding your invoice.   Our billing staff will not be able to assist you with questions regarding bills from these companies.  You will be contacted with the lab results as soon as they are available. The fastest way to get your results is to activate your My Chart account. Instructions are located on the last page of this paperwork. If you have not heard from [place] regarding the results in 2 weeks, please contact this office.

## 2017-09-28 ENCOUNTER — Encounter (HOSPITAL_COMMUNITY)
Admission: RE | Admit: 2017-09-28 | Discharge: 2017-09-28 | Disposition: A | Discharge status: Home / Self Care | Payer: Medicare Other | Source: Ambulatory Visit | Attending: Cardiology | Admitting: Cardiology

## 2017-09-28 DIAGNOSIS — Z951 Presence of aortocoronary bypass graft: Secondary | ICD-10-CM | POA: Diagnosis not present

## 2017-09-28 DIAGNOSIS — Z7982 Long term (current) use of aspirin: Secondary | ICD-10-CM | POA: Diagnosis not present

## 2017-09-28 DIAGNOSIS — Z7984 Long term (current) use of oral hypoglycemic drugs: Secondary | ICD-10-CM | POA: Diagnosis not present

## 2017-09-28 DIAGNOSIS — Z79899 Other long term (current) drug therapy: Secondary | ICD-10-CM | POA: Diagnosis not present

## 2017-09-28 LAB — COMPREHENSIVE METABOLIC PANEL
A/G RATIO: 2 (ref 1.2–2.2)
ALBUMIN: 4.7 g/dL (ref 3.5–4.8)
ALT: 18 IU/L (ref 0–44)
AST: 15 IU/L (ref 0–40)
Alkaline Phosphatase: 84 IU/L (ref 39–117)
BILIRUBIN TOTAL: 0.4 mg/dL (ref 0.0–1.2)
BUN / CREAT RATIO: 12 (ref 10–24)
BUN: 16 mg/dL (ref 8–27)
CALCIUM: 9.5 mg/dL (ref 8.6–10.2)
CHLORIDE: 103 mmol/L (ref 96–106)
CO2: 24 mmol/L (ref 20–29)
Creatinine, Ser: 1.38 mg/dL — ABNORMAL HIGH (ref 0.76–1.27)
GFR, EST AFRICAN AMERICAN: 59 mL/min/{1.73_m2} — AB (ref >59)
GFR, EST NON AFRICAN AMERICAN: 51 mL/min/{1.73_m2} — AB (ref >59)
Globulin, Total: 2.4 g/dL (ref 1.5–4.5)
Glucose: 290 mg/dL — ABNORMAL HIGH (ref 65–99)
POTASSIUM: 4.2 mmol/L (ref 3.5–5.2)
Sodium: 144 mmol/L (ref 134–144)
TOTAL PROTEIN: 7.1 g/dL (ref 6.0–8.5)

## 2017-09-28 LAB — HEMOGLOBIN A1C
Est. average glucose Bld gHb Est-mCnc: 154 mg/dL
Hgb A1c MFr Bld: 7 % — ABNORMAL HIGH (ref 4.8–5.6)

## 2017-09-28 NOTE — Progress Notes (Addendum)
Antonio Wells 72 y.o. male DOB: 22-Aug-1945 MRN: [id number]      Nutrition Note  1. S/P CABG (coronary artery bypass graft)    Meds reviewed. Metformin noted.   Lab Results  Component Value Date   HGBA1C 7.0 (H) 09/27/2017   CBG (last 3)  No results for input(s): GLUCAP in the last 72 hours.  Nutrition Note Spoke with pt. Nutrition plan and survey reviewed with pt. Pt continues to follow a "Keto type diet" for wt loss and carb control. Per discussion, pt includes carbs not typical on a Keto diet (e.g. Potatoes, tortilla, etc). Pt is diabetic. Last A1c indicates blood glucose control improved significantly over the past 3 months. Pt is interested in more information re: diabetes and diet, which was discussed. Pt educated re: Carb Content of Foods. Pt expressed understanding of the information reviewed. Pt aware of nutrition education classes offered and plans on attending nutrition classes.  Nutrition Diagnosis ? Food-and nutrition-related knowledge deficit related to lack of exposure to information as related to diagnosis of: ? CVD ? DM ? Obesity related to excessive energy intake as evidenced by a BMI of 32.2  Nutrition Intervention ? Pt's individual nutrition plan reviewed with pt. ? Benefits of adopting Heart Healthy diet discussed when Med[employer]icts reviewed.   ? Pt given handouts for: ? Carb Content of Foods ? 5-day menu ideas  Nutrition Goal(s):  ? Pt to identify food quantities necessary to achieve weight loss of 6-20lb at graduation from cardiac rehab. Goal wt of 160 lb desired.  ? Pt able to name foods that affect blood glucose  ? Improved blood glucose control as evidenced by pt's A1c trending from 8.2 to less than 7.0.   Plan:  Pt to attend nutrition classes ? Nutrition I ? Nutrition II ? Portion Distortion ? Diabetes Blitz ? Diabetes Q & A Will provide client-centered nutrition education as part of interdisciplinary care.   Monitor and evaluate progress toward nutrition  goal with team.  Derek Mound, M.Ed, RD, LDN, CDE 09/28/2017 10:40 AM

## 2017-09-30 ENCOUNTER — Encounter (HOSPITAL_COMMUNITY)
Admission: RE | Admit: 2017-09-30 | Discharge: 2017-09-30 | Disposition: A | Discharge status: Home / Self Care | Payer: Medicare Other | Source: Ambulatory Visit | Attending: Cardiology | Admitting: Cardiology

## 2017-09-30 DIAGNOSIS — Z7984 Long term (current) use of oral hypoglycemic drugs: Secondary | ICD-10-CM | POA: Diagnosis not present

## 2017-09-30 DIAGNOSIS — Z951 Presence of aortocoronary bypass graft: Secondary | ICD-10-CM

## 2017-09-30 DIAGNOSIS — Z7982 Long term (current) use of aspirin: Secondary | ICD-10-CM | POA: Diagnosis not present

## 2017-09-30 DIAGNOSIS — Z79899 Other long term (current) drug therapy: Secondary | ICD-10-CM | POA: Diagnosis not present

## 2017-10-03 ENCOUNTER — Encounter (HOSPITAL_COMMUNITY)
Admission: RE | Admit: 2017-10-03 | Discharge: 2017-10-03 | Disposition: A | Discharge status: Home / Self Care | Payer: Medicare Other | Source: Ambulatory Visit | Attending: Cardiology | Admitting: Cardiology

## 2017-10-03 DIAGNOSIS — Z7982 Long term (current) use of aspirin: Secondary | ICD-10-CM | POA: Diagnosis not present

## 2017-10-03 DIAGNOSIS — Z951 Presence of aortocoronary bypass graft: Secondary | ICD-10-CM | POA: Diagnosis not present

## 2017-10-03 DIAGNOSIS — Z79899 Other long term (current) drug therapy: Secondary | ICD-10-CM | POA: Diagnosis not present

## 2017-10-03 DIAGNOSIS — Z7984 Long term (current) use of oral hypoglycemic drugs: Secondary | ICD-10-CM | POA: Diagnosis not present

## 2017-10-05 ENCOUNTER — Encounter (HOSPITAL_COMMUNITY)
Admission: RE | Admit: 2017-10-05 | Discharge: 2017-10-05 | Disposition: A | Discharge status: Home / Self Care | Payer: Medicare Other | Source: Ambulatory Visit | Attending: Cardiology | Admitting: Cardiology

## 2017-10-05 DIAGNOSIS — Z79899 Other long term (current) drug therapy: Secondary | ICD-10-CM | POA: Diagnosis not present

## 2017-10-05 DIAGNOSIS — Z951 Presence of aortocoronary bypass graft: Secondary | ICD-10-CM

## 2017-10-05 DIAGNOSIS — Z7982 Long term (current) use of aspirin: Secondary | ICD-10-CM | POA: Diagnosis not present

## 2017-10-05 DIAGNOSIS — Z7984 Long term (current) use of oral hypoglycemic drugs: Secondary | ICD-10-CM | POA: Diagnosis not present

## 2017-10-07 ENCOUNTER — Encounter (HOSPITAL_COMMUNITY)
Admission: RE | Admit: 2017-10-07 | Discharge: 2017-10-07 | Disposition: A | Discharge status: Home / Self Care | Payer: Medicare Other | Source: Ambulatory Visit | Attending: Cardiology | Admitting: Cardiology

## 2017-10-07 DIAGNOSIS — Z7984 Long term (current) use of oral hypoglycemic drugs: Secondary | ICD-10-CM | POA: Diagnosis not present

## 2017-10-07 DIAGNOSIS — Z79899 Other long term (current) drug therapy: Secondary | ICD-10-CM | POA: Diagnosis not present

## 2017-10-07 DIAGNOSIS — Z7982 Long term (current) use of aspirin: Secondary | ICD-10-CM | POA: Diagnosis not present

## 2017-10-07 DIAGNOSIS — Z951 Presence of aortocoronary bypass graft: Secondary | ICD-10-CM

## 2017-10-10 ENCOUNTER — Encounter (HOSPITAL_COMMUNITY)
Admission: RE | Admit: 2017-10-10 | Discharge: 2017-10-10 | Disposition: A | Discharge status: Home / Self Care | Payer: Medicare Other | Source: Ambulatory Visit | Attending: Cardiology | Admitting: Cardiology

## 2017-10-10 ENCOUNTER — Ambulatory Visit (INDEPENDENT_AMBULATORY_CARE_PROVIDER_SITE_OTHER): Payer: Medicare Other | Admitting: Neurology

## 2017-10-10 DIAGNOSIS — G4733 Obstructive sleep apnea (adult) (pediatric): Secondary | ICD-10-CM

## 2017-10-10 DIAGNOSIS — E1165 Type 2 diabetes mellitus with hyperglycemia: Secondary | ICD-10-CM

## 2017-10-10 DIAGNOSIS — Z7984 Long term (current) use of oral hypoglycemic drugs: Secondary | ICD-10-CM | POA: Diagnosis not present

## 2017-10-10 DIAGNOSIS — Z7982 Long term (current) use of aspirin: Secondary | ICD-10-CM | POA: Diagnosis not present

## 2017-10-10 DIAGNOSIS — I25110 Atherosclerotic heart disease of native coronary artery with unstable angina pectoris: Secondary | ICD-10-CM

## 2017-10-10 DIAGNOSIS — Z951 Presence of aortocoronary bypass graft: Secondary | ICD-10-CM | POA: Diagnosis not present

## 2017-10-10 DIAGNOSIS — G4734 Idiopathic sleep related nonobstructive alveolar hypoventilation: Secondary | ICD-10-CM

## 2017-10-10 DIAGNOSIS — Z79899 Other long term (current) drug therapy: Secondary | ICD-10-CM | POA: Diagnosis not present

## 2017-10-12 ENCOUNTER — Encounter (HOSPITAL_COMMUNITY)
Admission: RE | Admit: 2017-10-12 | Discharge: 2017-10-12 | Disposition: A | Discharge status: Home / Self Care | Payer: Medicare Other | Source: Ambulatory Visit | Attending: Cardiology | Admitting: Cardiology

## 2017-10-12 DIAGNOSIS — Z79899 Other long term (current) drug therapy: Secondary | ICD-10-CM | POA: Diagnosis not present

## 2017-10-12 DIAGNOSIS — Z7982 Long term (current) use of aspirin: Secondary | ICD-10-CM | POA: Diagnosis not present

## 2017-10-12 DIAGNOSIS — Z7984 Long term (current) use of oral hypoglycemic drugs: Secondary | ICD-10-CM | POA: Diagnosis not present

## 2017-10-12 DIAGNOSIS — Z951 Presence of aortocoronary bypass graft: Secondary | ICD-10-CM

## 2017-10-13 NOTE — Progress Notes (Signed)
Cardiac Individual Treatment Plan  Patient Details  Name: Antonio Wells MRN: [id number] Date of Birth: [birth year] Referring Provider:     CARDIAC REHAB PHASE II ORIENTATION from 09/06/2017 in [facility]  Referring Provider  Ena Dawley, MD.      Initial Encounter Date:    CARDIAC REHAB PHASE II ORIENTATION from 09/06/2017 in [facility]  Date  09/06/17  Referring Provider  Ena Dawley, MD.      Visit Diagnosis: S/P CABG (coronary artery bypass graft)  Patient's Home Medications on Admission:  Current Outpatient Medications:  .  amLODipine (NORVASC) 10 MG tablet, Take 1 tablet (10 mg total) by mouth daily., Disp: 90 tablet, Rfl: 1 .  aspirin EC 81 MG tablet, Take 1 tablet (81 mg total) by mouth daily., Disp: 90 tablet, Rfl: 3 .  atorvastatin (LIPITOR) 80 MG tablet, Take 1 tablet (80 mg total) by mouth daily at 6 PM., Disp: 90 tablet, Rfl: 1 .  colchicine 0.6 MG tablet, Take 1-2 tablets (0.6-1.2 mg total) by mouth daily as needed (for gout flare). 2 po at onset of flair and then repeat in 1 h for acute gout attack then 1 po qd until pain free, Disp: 30 tablet, Rfl: 0 .  metFORMIN (GLUCOPHAGE) 500 MG tablet, Take 500 mg by mouth 2 (two) times daily with a meal., Disp: , Rfl:  .  metoprolol tartrate (LOPRESSOR) 50 MG tablet, Take 1 tablet (50 mg total) by mouth 2 (two) times daily., Disp: 180 tablet, Rfl: 1  Past Medical History: Past Medical History:  Diagnosis Date  . Arthritis    "knees" (07/11/2017)  . CKD (chronic kidney disease), stage III ([place])   . Coronary artery disease    a.  CABGx3 in 05/2017.  [facility] Kitchen Gout   . Heart murmur   . High cholesterol   . Hypertension   . Mild aortic stenosis   . Mild mitral regurgitation   . OSA on CPAP   . Postoperative atrial fibrillation ([place]) 06/2017  . Type II diabetes mellitus ([facility])     Tobacco Use: Social History   Tobacco Use  Smoking Status Former Smoker   . Packs/day: 2.00  . Years: 15.00  . Pack years: 30.00  . Types: Cigarettes  . Last attempt to quit: 07/26/1998  . Years since quitting: 19.2  Smokeless Tobacco Never Used    Labs: Recent Chemical engineer    Labs for ITP Cardiac and Pulmonary Rehab Latest Ref Rng & Units 06/24/2017 06/24/2017 06/24/2017 06/25/2017 09/27/2017   Cholestrol 100 - 199 mg/dL - - - - -   LDLCALC 0 - 99 mg/dL - - - - -   HDL >39 mg/dL - - - - -   Trlycerides 0 - 149 mg/dL - - - - -   Hemoglobin A1c 4.8 - 5.6 % - - - - 7.0(H)   PHART 7.350 - 7.450 7.374 - 7.361 - -   PCO2ART 32.0 - 48.0 mmHg 45.1 - 46.4 - -   HCO3 20.0 - 28.0 mmol/L 26.2 - 26.2 - -   TCO2 22 - 32 mmol/L 28 24 28 24  -   O2SAT % 93.0 - 94.0 - -      Capillary Blood Glucose: Lab Results  Component Value Date   GLUCAP 108 (H) 09/16/2017   GLUCAP 136 (H) 09/16/2017   GLUCAP 142 (H) 09/14/2017   GLUCAP 137 (H) 09/14/2017   GLUCAP 228 (H) 09/12/2017  Exercise Target Goals:    Exercise Program Goal: Individual exercise prescription set using results from initial 6 min walk test and THRR while considering  patient's activity barriers and safety.   Exercise Prescription Goal: Initial exercise prescription builds to 30-45 minutes a day of aerobic activity, 2-3 days per week.  Home exercise guidelines will be given to patient during program as part of exercise prescription that the participant will acknowledge.  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 Right knee pain. Bilateral hip pain with walking.    Cardiac Risk Stratification  High       6 Minute Walk: 6 Minute Walk    Row Name 09/06/17 1146         6 Minute Walk   Phase  Initial     Distance  839 feet     Walk Time  6 minutes     # of Rest Breaks   0     MPH  1.59     METS  1.87     RPE  13     VO2 Peak  6.54     Symptoms  Yes (comment)     Comments  Patient c/o knee pain, 6/10 on the pain scale.     Resting HR  72 bpm     Resting BP  120/80     Resting Oxygen Saturation   98 %     Exercise Oxygen Saturation  during 6 min walk  99 %     Max Ex. HR  89 bpm     Max Ex. BP  160/78     2 Minute Post BP  120/80        Oxygen Initial Assessment:   Oxygen Re-Evaluation:   Oxygen Discharge (Final Oxygen Re-Evaluation):   Initial Exercise Prescription: Initial Exercise Prescription - 09/06/17 1100      Date of Initial Exercise RX and Referring Provider   Date  09/06/17    Referring Provider  Ena Dawley, MD.      Recumbant Bike   Level  3    Minutes  10    METs  2      NuStep   Level  3    SPM  85    Minutes  10    METs  2      Arm Ergometer   Level  3    RPM  30    Minutes  10      Prescription Details   Frequency (times per week)  3    Duration  Progress to 30 minutes of continuous aerobic without signs/symptoms of physical distress      Intensity   THRR 40-80% of Max Heartrate  60-120    Ratings of Perceived Exertion  11-13    Perceived Dyspnea  --      Progression   Progression  Continue to progress workloads to maintain intensity without signs/symptoms of physical distress.      Resistance Training   Training Prescription  Yes    Weight  3lbs.    Reps  10-15       Perform Capillary Blood Glucose checks as needed.  Exercise Prescription Changes: Exercise Prescription Changes    Row Name 09/12/17 [phone] 09/19/17 [phone] 10/07/17 [phone]  Response to Exercise   Blood Pressure (Admit)  128/80  136/80  110/70     Blood Pressure (Exercise)  148/74  148/70  122/70     Blood Pressure (Exit)  122/80  118/62  112/70     Heart Rate (Admit)  88 bpm  87 bpm  79 bpm     Heart Rate (Exercise)  88 bpm  88 bpm  90 bpm     Heart Rate (Exit)  75 bpm  71 bpm  73 bpm     Rating of Perceived Exertion  (Exercise)  13  11  11      Symptoms  none  none  none     Duration  Continue with 30 min of aerobic exercise without signs/symptoms of physical distress.  Continue with 30 min of aerobic exercise without signs/symptoms of physical distress.  Continue with 45 min of aerobic exercise without signs/symptoms of physical distress.     Intensity  THRR unchanged  THRR unchanged  THRR unchanged       Progression   Progression  Continue to progress workloads to maintain intensity without signs/symptoms of physical distress.  Continue to progress workloads to maintain intensity without signs/symptoms of physical distress.  Continue to progress workloads to maintain intensity without signs/symptoms of physical distress.     Average METs  1.9  2.1  2.6       Resistance Training   Training Prescription  Yes  Yes  Yes     Weight  3lbs.  3lbs.  3lbs.     Reps  10-15  10-15  10-15     Time  10 Minutes  10 Minutes  10 Minutes       Interval Training   Interval Training  No  No  No       Recumbant Bike   Level  3  3  4      Minutes  10  10  10      METs  1.5  2.2  2.8       NuStep   Level  3  3  4      SPM  85  85  85     Minutes  10  10  10      METs  2.3  2  3        Arm Ergometer   Level  3  3  4 16  watts, 1.98 METs     RPM  30  30  30      Minutes  10  10  10        Home Exercise Plan   Plans to continue exercise at  [employer] (comment)  Forensic scientist (comment)  Forensic scientist (comment)     Frequency  -  Add 1 additional day to program exercise sessions.  Add 2 additional days to program exercise sessions.     Initial Home Exercises Provided  -  09/16/17  09/16/17        Exercise Comments: Exercise Comments    Row Name 09/12/17 0950 09/16/17 1010 09/19/17 0953 10/10/17 1015     Exercise Comments  Off to a good start with exercise.  Reviewed home exercise guidelines, METs and goals with patient.  Reviewed METs with patient.  Reviewed METs and goals with patient.        Exercise Goals and Review: Exercise Goals    Row Name 09/06/17 1152             Exercise Goals  Increase Physical Activity  Yes       Intervention  Provide advice, education, support and counseling about physical activity/exercise needs.;Develop an individualized exercise prescription for aerobic and resistive training based on initial evaluation findings, risk stratification, comorbidities and participant's personal goals.       Expected Outcomes  Long Term: Exercising regularly at least 3-5 days a week.;Long Term: Add in home exercise to make exercise part of routine and to increase amount of physical activity.       Increase Strength and Stamina  Yes       Intervention  Provide advice, education, support and counseling about physical activity/exercise needs.;Develop an individualized exercise prescription for aerobic and resistive training based on initial evaluation findings, risk stratification, comorbidities and participant's personal goals.       Expected Outcomes  Short Term: Increase workloads from initial exercise prescription for resistance, speed, and METs.;Short Term: Perform resistance training exercises routinely during rehab and add in resistance training at home;Long Term: Improve cardiorespiratory fitness, muscular endurance and strength as measured by increased METs and functional capacity (6MWT)       Able to understand and use rate of perceived exertion (RPE) scale  Yes       Intervention  Provide education and explanation on how to use RPE scale       Expected Outcomes  Short Term: Able to use RPE daily in rehab to express subjective intensity level;Long Term:  Able to use RPE to guide intensity level when exercising independently       Knowledge and understanding of Target Heart Rate Range (THRR)  Yes       Intervention  Provide education and explanation of THRR including how the numbers were predicted and where they are located for reference       Expected Outcomes   Short Term: Able to state/look up THRR;Long Term: Able to use THRR to govern intensity when exercising independently;Short Term: Able to use daily as guideline for intensity in rehab       Able to check pulse independently  Yes       Intervention  Provide education and demonstration on how to check pulse in carotid and radial arteries.;Review the importance of being able to check your own pulse for safety during independent exercise       Expected Outcomes  Short Term: Able to explain why pulse checking is important during independent exercise;Long Term: Able to check pulse independently and accurately       Understanding of Exercise Prescription  Yes       Intervention  Provide education, explanation, and written materials on patient's individual exercise prescription       Expected Outcomes  Short Term: Able to explain program exercise prescription;Long Term: Able to explain home exercise prescription to exercise independently          Exercise Goals Re-Evaluation : Exercise Goals Re-Evaluation    Row Name 09/12/17 1408 09/16/17 1010 10/10/17 1015         Exercise Goal Re-Evaluation   Exercise Goals Review  Able to understand and use rate of perceived exertion (RPE) scale  Able to understand and use rate of perceived exertion (RPE) scale;Understanding of Exercise Prescription;Knowledge and understanding of Target Heart Rate Range (THRR)  Understanding of Exercise Prescription     Comments  Patient tolerated first full day of exercise well without c/o. Pt able to use RPE scale appropriately.  Reviewed home exercise guidelines with patient including THRR, RPE scale and endpoints for  exercise. [facility] primarily riding the recumbent bike and using weight machines. Encouraged pt to [facility] at least [facility].  [facility], [facility] 30-50 minutes, 2 days/week in addition to exercise at [facility].      Expected Outcomes  Increase workloads as tolerated to achieve health and fitness goals.  [facility] will [facility] to help achieve health and fitness goals.  Continue exercise at least 30 minutes, 5 days/[facility] to increase strength and stamina.         Discharge Exercise Prescription (Final Exercise Prescription Changes): Exercise Prescription Changes - 10/07/17 0950      Response to Exercise   Blood Pressure (Admit)  110/70    Blood Pressure (Exercise)  122/70    Blood Pressure (Exit)  112/70    Heart Rate (Admit)  79 bpm    Heart Rate (Exercise)  90 bpm    Heart Rate (Exit)  73 bpm    Rating of Perceived Exertion (Exercise)  11    Symptoms  none    Duration  Continue with 45 min of aerobic exercise without signs/symptoms of physical distress.    Intensity  THRR unchanged      Progression   Progression  Continue to progress workloads to maintain intensity without signs/symptoms of physical distress.    Average METs  2.6      Resistance Training   Training Prescription  Yes    Weight  3lbs.    Reps  10-15    Time  10 Minutes      Interval Training   Interval Training  No      Recumbant Bike   Level  4    Minutes  10    METs  2.8      NuStep   Level  4    SPM  85    Minutes  10    METs  3      Arm Ergometer   Level  4 16 watts, 1.98 METs    RPM  30    Minutes  10      Home Exercise Plan   Plans to continue exercise at  [facility] (comment)    Frequency  Add 2 additional days to program exercise sessions.    Initial Home Exercises Provided  09/16/17       Nutrition:  Target Goals: Understanding of nutrition guidelines, daily intake of sodium 1500mg , cholesterol 200mg , calories 30% from fat and 7% or less from saturated fats, daily to have 5 or more servings of fruits and vegetables.  Biometrics: Pre Biometrics - 09/06/17 1153      Pre Biometrics   Height  5\' 3"  (1.6 m)    Weight  181 lb 7 oz (82.3  kg)    Waist Circumference  39.75 inches    Hip Circumference  40.25 inches    Waist to Hip Ratio  0.99 %    BMI (Calculated)  32.15    Triceps Skinfold  15 mm    % Body Fat  29.8 %    Grip Strength  41.5 kg    Flexibility  14 in    Single Leg Stand  30 seconds        Nutrition Therapy Plan and Nutrition Goals: Nutrition Therapy & Goals - 09/28/17 1135  Nutrition Therapy   Diet  Carb Modified, Heart Healthy      Personal Nutrition Goals   Nutrition Goal  Pt to identify food quantities necessary to achieve weight loss of 6-20lb at graduation from cardiac rehab. Goal wt of 160 lb desired.     Personal Goal #2  Improved blood glucose control as evidenced by pt's A1c trending from 8.2 to less than 7.0.    Personal Goal #3  Pt able to name foods that affect blood glucose       Intervention Plan   Intervention  Prescribe, educate and counsel regarding individualized specific dietary modifications aiming towards targeted core components such as weight, hypertension, lipid management, diabetes, heart failure and other comorbidities.    Expected Outcomes  Short Term Goal: Understand basic principles of dietary content, such as calories, fat, sodium, cholesterol and nutrients.;Long Term Goal: Adherence to prescribed nutrition plan.       Nutrition Assessments: Nutrition Assessments - 09/28/17 1147      MEDFICTS Scores   Pre Score  81       Nutrition Goals Re-Evaluation:   Nutrition Goals Re-Evaluation:   Nutrition Goals Discharge (Final Nutrition Goals Re-Evaluation):   Psychosocial: Target Goals: Acknowledge presence or absence of significant depression and/or stress, maximize coping skills, provide positive support system. Participant is able to verbalize types and ability to use techniques and skills needed for reducing stress and depression.  Initial Review & Psychosocial Screening: Initial Psych Review & Screening - 09/06/17 1208      Initial Review   Current  issues with  None Identified      Family Dynamics   Good Support System?  Yes wife present during orientation    Comments  no psychosocial needsd identified, no interventions necessary       Barriers   Psychosocial barriers to participate in program  There are no identifiable barriers or psychosocial needs.      Screening Interventions   Interventions  Encouraged to exercise       Quality of Life Scores: Quality of Life - 09/12/17 1406      Quality of Life Scores   Health/Function Pre  23.53 %    Socioeconomic Pre  25.33 %    Psych/Spiritual Pre  28.29 %    Family Pre  28.8 %    GLOBAL Pre  25.67 %      Scores of 19 and below usually indicate a poorer quality of life in these areas.  A difference of  2-3 points is a clinically meaningful difference.  A difference of 2-3 points in the total score of the Quality of Life Index has been associated with significant improvement in overall quality of life, self-image, physical symptoms, and general health in studies assessing change in quality of life.  PHQ-9: Recent Review Flowsheet Data    Depression screen [facility] 2/9 09/27/2017 08/16/2017 07/30/2017 07/11/2017 07/07/2017   Decreased Interest 0 0 0 0 0   Down, Depressed, Hopeless 0 0 0 0 0   PHQ - 2 Score 0 0 0 0 0     Interpretation of Total Score  Total Score Depression Severity:  1-4 = Minimal depression, 5-9 = Mild depression, 10-14 = Moderate depression, 15-19 = Moderately severe depression, 20-27 = Severe depression   Psychosocial Evaluation and Intervention:   Psychosocial Re-Evaluation: Psychosocial Re-Evaluation    [place] Name 10/13/17 1022             Psychosocial Re-Evaluation   Current issues with  None Identified       Interventions  Encouraged to attend Cardiac Rehabilitation for the exercise       Continue Psychosocial Services   No Follow up required          Psychosocial Discharge (Final Psychosocial Re-Evaluation): Psychosocial Re-Evaluation - 10/13/17 1022       Psychosocial Re-Evaluation   Current issues with  None Identified    Interventions  Encouraged to attend Cardiac Rehabilitation for the exercise    Continue Psychosocial Services   No Follow up required       Vocational Rehabilitation: Provide vocational rehab assistance to qualifying candidates.   Vocational Rehab Evaluation & Intervention:   Education: Education Goals: Education classes will be provided on a weekly basis, covering required topics. Participant will state understanding/return demonstration of topics presented.  Learning Barriers/Preferences: Learning Barriers/Preferences - 09/06/17 1155      Learning Barriers/Preferences   Learning Barriers  None    Learning Preferences  None       Education Topics: Count Your Pulse:  -Group instruction provided by verbal instruction, demonstration, patient participation and written materials to support subject.  Instructors address importance of being able to find your pulse and how to count your pulse when at home without a heart monitor.  Patients get hands on experience counting their pulse with staff help and individually.   Heart Attack, Angina, and Risk Factor Modification:  -Group instruction provided by verbal instruction, video, and written materials to support subject.  Instructors address signs and symptoms of angina and heart attacks.    Also discuss risk factors for heart disease and how to make changes to improve heart health risk factors.   Functional Fitness:  -Group instruction provided by verbal instruction, demonstration, patient participation, and written materials to support subject.  Instructors address safety measures for doing things around the house.  Discuss how to get up and down off the floor, how to pick things up properly, how to safely get out of a chair without assistance, and balance training.   Meditation and Mindfulness:  -Group instruction provided by verbal instruction, patient  participation, and written materials to support subject.  Instructor addresses importance of mindfulness and meditation practice to help reduce stress and improve awareness.  Instructor also leads participants through a meditation exercise.    Stretching for Flexibility and Mobility:  -Group instruction provided by verbal instruction, patient participation, and written materials to support subject.  Instructors lead participants through series of stretches that are designed to increase flexibility thus improving mobility.  These stretches are additional exercise for major muscle groups that are typically performed during regular warm up and cool down.   Hands Only CPR:  -Group verbal, video, and participation provides a basic overview of AHA guidelines for community CPR. Role-play of emergencies allow participants the opportunity to practice calling for help and chest compression technique with discussion of AED use.   Hypertension: -Group verbal and written instruction that provides a basic overview of hypertension including the most recent diagnostic guidelines, risk factor reduction with self-care instructions and medication management.    Nutrition I class: Heart Healthy Eating:  -Group instruction provided by PowerPoint slides, verbal discussion, and written materials to support subject matter. The instructor gives an explanation and review of the Therapeutic Lifestyle Changes diet recommendations, which includes a discussion on lipid goals, dietary fat, sodium, fiber, plant stanol/sterol esters, sugar, and the components of a well-balanced, healthy diet.   Nutrition II class: Lifestyle Skills:  -Group instruction  provided by PowerPoint slides, verbal discussion, and written materials to support subject matter. The instructor gives an explanation and review of label reading, grocery shopping for heart health, heart healthy recipe modifications, and ways to make healthier choices when eating  out.   Diabetes Question & Answer:  -Group instruction provided by PowerPoint slides, verbal discussion, and written materials to support subject matter. The instructor gives an explanation and review of diabetes co-morbidities, pre- and post-prandial blood glucose goals, pre-exercise blood glucose goals, signs, symptoms, and treatment of hypoglycemia and hyperglycemia, and foot care basics.   Diabetes Blitz:  -Group instruction provided by PowerPoint slides, verbal discussion, and written materials to support subject matter. The instructor gives an explanation and review of the physiology behind type 1 and type 2 diabetes, diabetes medications and rational behind using different medications, pre- and post-prandial blood glucose recommendations and Hemoglobin A1c goals, diabetes diet, and exercise including blood glucose guidelines for exercising safely.    Portion Distortion:  -Group instruction provided by PowerPoint slides, verbal discussion, written materials, and food models to support subject matter. The instructor gives an explanation of serving size versus portion size, changes in portions sizes over the last 20 years, and what consists of a serving from each food group.   Stress Management:  -Group instruction provided by verbal instruction, video, and written materials to support subject matter.  Instructors review role of stress in heart disease and how to cope with stress positively.     Exercising on Your Own:  -Group instruction provided by verbal instruction, power point, and written materials to support subject.  Instructors discuss benefits of exercise, components of exercise, frequency and intensity of exercise, and end points for exercise.  Also discuss use of nitroglycerin and activating EMS.  Review options of places to exercise outside of rehab.  Review guidelines for sex with heart disease.   Cardiac Drugs I:  -Group instruction provided by verbal instruction and  written materials to support subject.  Instructor reviews cardiac drug classes: antiplatelets, anticoagulants, beta blockers, and statins.  Instructor discusses reasons, side effects, and lifestyle considerations for each drug class.   Cardiac Drugs II:  -Group instruction provided by verbal instruction and written materials to support subject.  Instructor reviews cardiac drug classes: angiotensin converting enzyme inhibitors (ACE-I), angiotensin II receptor blockers (ARBs), nitrates, and calcium channel blockers.  Instructor discusses reasons, side effects, and lifestyle considerations for each drug class.   Anatomy and Physiology of the Circulatory System:  Group verbal and written instruction and models provide basic cardiac anatomy and physiology, with the coronary electrical and arterial systems. Review of: AMI, Angina, Valve disease, Heart Failure, Peripheral Artery Disease, Cardiac Arrhythmia, Pacemakers, and the ICD.   Other Education:  -Group or individual verbal, written, or video instructions that support the educational goals of the cardiac rehab program.   Holiday Eating Survival Tips:  -Group instruction provided by PowerPoint slides, verbal discussion, and written materials to support subject matter. The instructor gives patients tips, tricks, and techniques to help them not only survive but enjoy the holidays despite the onslaught of food that accompanies the holidays.   Knowledge Questionnaire Score: Knowledge Questionnaire Score - 09/12/17 1406      Knowledge Questionnaire Score   Pre Score  20/24       Core Components/Risk Factors/Patient Goals at Admission: Personal Goals and Risk Factors at Admission - 09/06/17 1141      Core Components/Risk Factors/Patient Goals on Admission    Weight Management  Yes;Obesity  Intervention  Weight Management/Obesity: Establish reasonable short term and long term weight goals.;Obesity: Provide education and appropriate resources  to help participant work on and attain dietary goals.    Admit Weight  181 lb 7 oz (82.3 kg)    Goal Weight: Short Term  175 lb (79.4 kg)    Goal Weight: Long Term  160 lb (72.6 kg)    Expected Outcomes  Short Term: Continue to assess and modify interventions until short term weight is achieved;Long Term: Adherence to nutrition and physical activity/exercise program aimed toward attainment of established weight goal;Weight Loss: Understanding of general recommendations for a balanced deficit meal plan, which promotes 1-2 lb weight loss per week and includes a negative energy balance of [phone] kcal/d    Diabetes  Yes    Intervention  Provide education about signs/symptoms and action to take for hypo/hyperglycemia.;Provide education about proper nutrition, including hydration, and aerobic/resistive exercise prescription along with prescribed medications to achieve blood glucose in normal ranges: Fasting glucose 65-99 mg/dL    Expected Outcomes  Short Term: Participant verbalizes understanding of the signs/symptoms and immediate care of hyper/hypoglycemia, proper foot care and importance of medication, aerobic/resistive exercise and nutrition plan for blood glucose control.;Long Term: Attainment of HbA1C < 7%.    Hypertension  Yes    Intervention  Provide education on lifestyle modifcations including regular physical activity/exercise, weight management, moderate sodium restriction and increased consumption of fresh fruit, vegetables, and low fat dairy, alcohol moderation, and smoking cessation.;Monitor prescription use compliance.    Expected Outcomes  Short Term: Continued assessment and intervention until BP is < 140/46mm HG in hypertensive participants. < 130/60mm HG in hypertensive participants with diabetes, heart failure or chronic kidney disease.;Long Term: Maintenance of blood pressure at goal levels.    Lipids  Yes    Intervention  Provide education and support for participant on nutrition &  aerobic/resistive exercise along with prescribed medications to achieve LDL 70mg , HDL >40mg .    Expected Outcomes  Short Term: Participant states understanding of desired cholesterol values and is compliant with medications prescribed. Participant is following exercise prescription and nutrition guidelines.;Long Term: Cholesterol controlled with medications as prescribed, with individualized exercise RX and with personalized nutrition plan. Value goals: LDL < 70mg , HDL > 40 mg.       Core Components/Risk Factors/Patient Goals Review:  Goals and Risk Factor Review    Row Name 10/13/17 1021             Core Components/Risk Factors/Patient Goals Review   Personal Goals Review  Weight Management/Obesity;Lipids;Hypertension;Diabetes       Review  Jeovanni's vital signs and CBG's have been stable at cardiac rehab       Expected Outcomes  Damoni will continue to take his medicaitons as presribed and exercise at phase 2 cardiac rehab          Core Components/Risk Factors/Patient Goals at Discharge (Final Review):  Goals and Risk Factor Review - 10/13/17 1021      Core Components/Risk Factors/Patient Goals Review   Personal Goals Review  Weight Management/Obesity;Lipids;Hypertension;Diabetes    Review  Kento's vital signs and CBG's have been stable at cardiac rehab    Expected Outcomes  My will continue to take his medicaitons as presribed and exercise at phase 2 cardiac rehab       ITP Comments: ITP Comments    Row Name 09/06/17 0919 09/13/17 1542 10/13/17 1018       ITP Comments  Dr. Fransico Him, Medical Director  30 Day ITP Review. Patient is off to a good start to exercise this week.  30 day ITP review. Patient with good attendance and participation in phase 2 cardiac rehab        Comments: See ITP comments.Barnet Pall, RN,BSN 10/13/2017 10:25 AM

## 2017-10-14 ENCOUNTER — Encounter (HOSPITAL_COMMUNITY)
Admission: RE | Admit: 2017-10-14 | Discharge: 2017-10-14 | Disposition: A | Discharge status: Home / Self Care | Payer: Medicare Other | Source: Ambulatory Visit | Attending: Cardiology | Admitting: Cardiology

## 2017-10-14 DIAGNOSIS — Z951 Presence of aortocoronary bypass graft: Secondary | ICD-10-CM | POA: Diagnosis not present

## 2017-10-14 DIAGNOSIS — Z7984 Long term (current) use of oral hypoglycemic drugs: Secondary | ICD-10-CM | POA: Diagnosis not present

## 2017-10-14 DIAGNOSIS — Z7982 Long term (current) use of aspirin: Secondary | ICD-10-CM | POA: Diagnosis not present

## 2017-10-14 DIAGNOSIS — Z79899 Other long term (current) drug therapy: Secondary | ICD-10-CM | POA: Diagnosis not present

## 2017-10-17 ENCOUNTER — Encounter (HOSPITAL_COMMUNITY)
Admission: RE | Admit: 2017-10-17 | Discharge: 2017-10-17 | Disposition: A | Discharge status: Home / Self Care | Payer: Medicare Other | Source: Ambulatory Visit | Attending: Cardiology | Admitting: Cardiology

## 2017-10-17 DIAGNOSIS — Z951 Presence of aortocoronary bypass graft: Secondary | ICD-10-CM

## 2017-10-17 DIAGNOSIS — Z79899 Other long term (current) drug therapy: Secondary | ICD-10-CM | POA: Diagnosis not present

## 2017-10-17 DIAGNOSIS — Z7984 Long term (current) use of oral hypoglycemic drugs: Secondary | ICD-10-CM | POA: Diagnosis not present

## 2017-10-17 DIAGNOSIS — Z7982 Long term (current) use of aspirin: Secondary | ICD-10-CM | POA: Diagnosis not present

## 2017-10-18 ENCOUNTER — Telehealth: Payer: Self-pay | Admitting: Neurology

## 2017-10-18 DIAGNOSIS — Z9989 Dependence on other enabling machines and devices: Principal | ICD-10-CM

## 2017-10-18 DIAGNOSIS — G4733 Obstructive sleep apnea (adult) (pediatric): Secondary | ICD-10-CM

## 2017-10-18 NOTE — Telephone Encounter (Signed)
I have made [facility] aware that the patient has changed his mind and would like to stay with [facility]. I have also sent the orders to [name].

## 2017-10-18 NOTE — Telephone Encounter (Signed)
I called pt. I advised pt that Dr. Brett Fairy reviewed their sleep study results and found that pt has severe sleep apnea. Dr. Brett Fairy recommends that pt starts a auto CPAP. I reviewed PAP compliance expectations with the pt. Pt is agreeable to starting a CPAP. I advised pt that an order will be sent to a DME, [facility], and [facility] will call the pt within about one week after they file with the pt's insurance. [facility] will show the pt how to use the machine, fit for masks, and troubleshoot the CPAP if needed. A follow up appt was made for insurance purposes with Cecille Rubin, NP on January 03, 2018 at 11:15 am. Pt verbalized understanding to arrive 15 minutes early and bring their CPAP. A letter with all of this information in it will be mailed to the pt as a reminder. I verified with the pt that the address we have on file is correct. Pt verbalized understanding of results. Pt had no questions at this time but was encouraged to call back if questions arise.

## 2017-10-18 NOTE — Telephone Encounter (Signed)
-----   Message from Larey Seat, MD sent at 10/18/2017  8:48 AM EDT ----- 1. Most severe Obstructive Sleep Apnea (OSA) AHI of 82.4/h with hypoxemia for a total time of 30 minutes.  2. Loud Snoring.  REC: Advise to start auto titration capable CPAP between 5 and 10  cmH2O and follow clinical symptomatology. If the patients CPAP is new enough ( less than 71 years old) reset the existing machine to 8 cm water)  2. A ResMed AirFit p 10 with large pillows was used with heated  humidity during this study. Advise to add heated humidity.  Adjust interface and heated humidity as needed.   3. Compliance to PAP therapy should be emphasized as equal or  more than 4 hours of nocturnal use.

## 2017-10-18 NOTE — Addendum Note (Signed)
Addended by: Larey Seat on: 10/18/2017 08:48 AM   Modules accepted: Orders

## 2017-10-18 NOTE — Procedures (Signed)
PATIENT'S NAME:  Antonio Wells, Antonio Wells DOB:      1946/07/24      MR#:    [id number]     DATE OF RECORDING: 10/10/2017 REFERRING M.D.:  [name], M.D. Study Performed:  Split-Night Titration Study HISTORY:   72 years of age, and established CPAP patient in our practice whom I had last seen in May 2016.  At the time he had just requalified by a new sleep test for the diagnosis of OSA and had received CPAP supplies.  His first revisit showed good compliance.  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   He was first placed on CPAP in 2011 after being told he had severe apnea. He discontinued using CPAP after he felt he had no benefit from using the CPAP.  He felt neither refreshed nor restored and continued to have trouble staying awake in meetings. This "embarrassment" caused him to look for early retirement. He has poorly controlled diabetes and confessed this may cause fatigue as well. He is now retired and often can take a nap after lunch. He has some restless legs symptoms, witnessed PLMs. He is a loud snorer and has apneas, witnessed by his wife, who shares the bedroom with him.  The patient endorsed the Epworth Sleepiness Scale at 15/24 points   The patient's weight 182 pounds with a height of 63 (inches), resulting in a BMI of 32.4 kg/m2. The patient's neck circumference measured 18.25 inches.  CURRENT MEDICATIONS: Norvasc, Aspirin, Lipitor, Colchicine, Lopressor   PROCEDURE:  This is a multichannel digital polysomnogram utilizing the SomnoStar 11.2 system.  Electrodes and sensors were applied and monitored per AASM Specifications.   EEG, EOG, Chin and Limb EMG, were sampled at 200 Hz.  ECG, Snore and Nasal Pressure, Thermal Airflow, Respiratory Effort, CPAP Flow and Pressure, Oximetry was  sampled at 50 Hz. Digital video and audio were recorded.      BASELINE STUDY WITHOUT CPAP RESULTS:  Lights Out was at 21:46 and Lights On at 05:38. Total recording time (TRT) was 238, with a total sleep time (TST) of 193 minutes.   The patient's sleep latency was 40 minutes.  REM latency was 73 minutes.  The sleep efficiency was 81.1 %.    SLEEP ARCHITECTURE: WASO (Wake after sleep onset) was 11 minutes, Stage N1 was 5.5 minutes, Stage N2 was 156 minutes, Stage N3 was 9.5 minutes and Stage R (REM sleep) was 22 minutes.  The percentages were Stage N1 2.8%, Stage N2 80.8%, Stage N3 4.9% and Stage R (REM sleep) 11.4%.   RESPIRATORY ANALYSIS:  There were a total of 265 respiratory events:  63 obstructive apneas, 0 central apneas and 0 mixed apneas with a total of 63 apneas and an apnea index (AI) of 19.6. There were 202 hypopneas with a hypopnea index of 62.8. The patient also had 0 respiratory event related arousals (RERAs).  Snoring was noted.     The total APNEA/HYPOPNEA INDEX (AHI) was 82.4 /hour and the total RESPIRATORY DISTURBANCE INDEX was 82.4 /hour.  28 events occurred in REM sleep and 368 events in NREM. The REM AHI was 76.4, /hour versus a non-REM AHI of 83.2 /hour. The patient spent 386 minutes sleep time in the supine position 0 minutes in non-supine. The supine AHI was 82.4 /  hour versus a non-supine AHI of 0.0 /hour.  OXYGEN SATURATION & C02:  The wake baseline 02 saturation was 98%, with the lowest being 62%. Time spent below 89% saturation equaled 30 minutes.  PERIODIC LIMB MOVEMENTS:   The patient had a total of 0 Periodic Limb Movements.  The arousals were noted as: 2 were spontaneous, 0 were associated with PLMs, and 265 were associated with respiratory events. Audio and video analysis did not show any abnormal or unusual movements, behaviors, phonations or vocalizations. Loud Snoring was noted. Twice up with nocturia. EKG was in keeping with normal sinus rhythm (NSR)  TITRATION  STUDY WITH CPAP RESULTS:   CPAP was initiated at 5 cmH20 with heated humidity per AASM split night standards and pressure was advanced to 8 cmH20 because of hypopneas, apneas and desaturations.  At a PAP pressure of 8.0 cmH20, there was a reduction of the AHI to 0.0 /hour.  Total recording time (TRT) was 234.5 minutes, with a total sleep time (TST) of 193 minutes. The patient's sleep latency was 37 minutes. REM latency was 16 minutes.  The sleep efficiency was 82.3 %.    SLEEP ARCHITECTURE: Wake after sleep was 3.5 minutes, Stage N1 3.5 minutes, Stage N2 60.5 minutes, Stage N3 22 minutes and Stage R (REM sleep) 107 minutes. The percentages were: Stage N1 1.8%, Stage N2 31.3%, Stage N3 11.4% and Stage R (REM sleep) 55.4%.   RESPIRATORY ANALYSIS:  There were a total of 8 respiratory events: 4 obstructive apneas, 0 central apneas and 4 hypopneas with 0 respiratory event related arousals (RERAs).     The total APNEA/HYPOPNEA INDEX (AHI) was 2.5 /hour and the total RESPIRATORY DISTURBANCE INDEX was 2.5 /hour.  6 events occurred in REM sleep and 2 events in NREM. The REM AHI was 3.4 /hour versus a non-REM AHI of 1.4 /hour. REM sleep was achieved on a pressure of 7 cm water. The patient spent 100% of total sleep time in the supine position. OXYGEN SATURATION & C02:  The wake baseline 02 saturation was 98%, with the lowest being 78%. Time spent below 89% saturation equaled 3 minutes.  PERIODIC LIMB MOVEMENTS:   The patient had a total of 0 Periodic Limb Movements. The arousals were noted as: 13 were spontaneous, 0 were associated with PLMs, and 8 were associated with respiratory events.   POLYSOMNOGRAPHY IMPRESSION :   1. Most severe Obstructive Sleep Apnea (OSA) AHI of 82.4/h with hypoxemia for a total time of 30 minutes.  2. Loud Snoring. 3. No nocturia on CPAP.  RECOMMENDATIONS: Good resolution of apnea and hypoxemia on CPAP of 8 cm water  pressure , 97.5% sleep efficiency and TST at this pressure of  173 minutes.   1. Advise to start auto titration capable CPAP between 5 and 10 cmH2O and follow clinical symptomatology.   2. A ResMed AirFit p 10 with large pillows was used with heated humidity during this study.  Advise to add heated humidity.  Adjust interface and heated humidity as needed.     3. Compliance to PAP therapy should be emphasized as equal or more than 4 hours of nocturnal use.  Compliance, AHI and air leak information to be downloaded for objective assessment at 30 days, 180 days and annually thereafter.   4. Advise to lose weight, by diet and exercise if not contraindicated (BMI 32.4). 5. Further information regarding OSA may be obtained from [employer] (www.sleepfoundation.org) or American Sleep Apnea Association (www.sleepapnea.org). 6. Avoid caffeine-containing beverages and  chocolate. You slept very well (97%of the recorded time) without TV in the room.  7. Consider dedicated sleep psychology referral if insomnia is of clinical concern.   8. A follow up appointment will be scheduled in the Sleep Clinic at [facility].      I certify that I have reviewed the entire raw data recording prior to the issuance of this report in accordance with the Standards of Accreditation of the American Academy of Sleep Medicine (AASM)    Larey Seat, M.D.  10-18-2017 Diplomat, American Board of Psychiatry and Neurology  Diplomat, [place] of Sleep Medicine Medical Director, [place] [facility]

## 2017-10-18 NOTE — Telephone Encounter (Signed)
Pt called stating that he is wanting to get his CPAP supplies through [facility] not [facility]

## 2017-10-19 ENCOUNTER — Encounter (HOSPITAL_COMMUNITY)
Admission: RE | Admit: 2017-10-19 | Discharge: 2017-10-19 | Disposition: A | Discharge status: Home / Self Care | Payer: Medicare Other | Source: Ambulatory Visit | Attending: Cardiology | Admitting: Cardiology

## 2017-10-19 DIAGNOSIS — Z7982 Long term (current) use of aspirin: Secondary | ICD-10-CM | POA: Diagnosis not present

## 2017-10-19 DIAGNOSIS — Z7984 Long term (current) use of oral hypoglycemic drugs: Secondary | ICD-10-CM | POA: Diagnosis not present

## 2017-10-19 DIAGNOSIS — Z951 Presence of aortocoronary bypass graft: Secondary | ICD-10-CM

## 2017-10-19 DIAGNOSIS — Z79899 Other long term (current) drug therapy: Secondary | ICD-10-CM | POA: Diagnosis not present

## 2017-10-21 ENCOUNTER — Encounter (HOSPITAL_COMMUNITY)
Admission: RE | Admit: 2017-10-21 | Discharge: 2017-10-21 | Disposition: A | Discharge status: Home / Self Care | Payer: Medicare Other | Source: Ambulatory Visit | Attending: Cardiology | Admitting: Cardiology

## 2017-10-21 DIAGNOSIS — Z951 Presence of aortocoronary bypass graft: Secondary | ICD-10-CM | POA: Diagnosis not present

## 2017-10-21 DIAGNOSIS — Z79899 Other long term (current) drug therapy: Secondary | ICD-10-CM | POA: Diagnosis not present

## 2017-10-21 DIAGNOSIS — Z7982 Long term (current) use of aspirin: Secondary | ICD-10-CM | POA: Diagnosis not present

## 2017-10-21 DIAGNOSIS — Z7984 Long term (current) use of oral hypoglycemic drugs: Secondary | ICD-10-CM | POA: Diagnosis not present

## 2017-10-24 ENCOUNTER — Encounter (HOSPITAL_COMMUNITY)
Admission: RE | Admit: 2017-10-24 | Discharge: 2017-10-24 | Disposition: A | Discharge status: Home / Self Care | Payer: Medicare Other | Source: Ambulatory Visit | Attending: Cardiology | Admitting: Cardiology

## 2017-10-24 DIAGNOSIS — Z79899 Other long term (current) drug therapy: Secondary | ICD-10-CM | POA: Insufficient documentation

## 2017-10-24 DIAGNOSIS — Z951 Presence of aortocoronary bypass graft: Secondary | ICD-10-CM | POA: Insufficient documentation

## 2017-10-24 DIAGNOSIS — Z7984 Long term (current) use of oral hypoglycemic drugs: Secondary | ICD-10-CM | POA: Diagnosis not present

## 2017-10-24 DIAGNOSIS — Z7982 Long term (current) use of aspirin: Secondary | ICD-10-CM | POA: Insufficient documentation

## 2017-10-26 ENCOUNTER — Encounter (HOSPITAL_COMMUNITY)
Admission: RE | Admit: 2017-10-26 | Discharge: 2017-10-26 | Disposition: A | Discharge status: Home / Self Care | Payer: Medicare Other | Source: Ambulatory Visit | Attending: Cardiology | Admitting: Cardiology

## 2017-10-26 DIAGNOSIS — Z7984 Long term (current) use of oral hypoglycemic drugs: Secondary | ICD-10-CM | POA: Diagnosis not present

## 2017-10-26 DIAGNOSIS — Z951 Presence of aortocoronary bypass graft: Secondary | ICD-10-CM | POA: Diagnosis not present

## 2017-10-26 DIAGNOSIS — Z79899 Other long term (current) drug therapy: Secondary | ICD-10-CM | POA: Diagnosis not present

## 2017-10-26 DIAGNOSIS — Z7982 Long term (current) use of aspirin: Secondary | ICD-10-CM | POA: Diagnosis not present

## 2017-10-28 ENCOUNTER — Encounter (HOSPITAL_COMMUNITY)
Admission: RE | Admit: 2017-10-28 | Discharge: 2017-10-28 | Disposition: A | Discharge status: Home / Self Care | Payer: Medicare Other | Source: Ambulatory Visit | Attending: Cardiology | Admitting: Cardiology

## 2017-10-28 DIAGNOSIS — Z951 Presence of aortocoronary bypass graft: Secondary | ICD-10-CM

## 2017-10-28 DIAGNOSIS — Z7982 Long term (current) use of aspirin: Secondary | ICD-10-CM | POA: Diagnosis not present

## 2017-10-28 DIAGNOSIS — Z79899 Other long term (current) drug therapy: Secondary | ICD-10-CM | POA: Diagnosis not present

## 2017-10-28 DIAGNOSIS — Z7984 Long term (current) use of oral hypoglycemic drugs: Secondary | ICD-10-CM | POA: Diagnosis not present

## 2017-10-29 ENCOUNTER — Encounter: Payer: Self-pay | Admitting: Cardiology

## 2017-10-31 ENCOUNTER — Encounter (HOSPITAL_COMMUNITY)
Admission: RE | Admit: 2017-10-31 | Discharge: 2017-10-31 | Disposition: A | Discharge status: Home / Self Care | Payer: Medicare Other | Source: Ambulatory Visit | Attending: Cardiology | Admitting: Cardiology

## 2017-10-31 DIAGNOSIS — Z951 Presence of aortocoronary bypass graft: Secondary | ICD-10-CM

## 2017-10-31 DIAGNOSIS — Z7982 Long term (current) use of aspirin: Secondary | ICD-10-CM | POA: Diagnosis not present

## 2017-10-31 DIAGNOSIS — Z7984 Long term (current) use of oral hypoglycemic drugs: Secondary | ICD-10-CM | POA: Diagnosis not present

## 2017-10-31 DIAGNOSIS — Z79899 Other long term (current) drug therapy: Secondary | ICD-10-CM | POA: Diagnosis not present

## 2017-11-02 ENCOUNTER — Encounter (HOSPITAL_COMMUNITY)
Admission: RE | Admit: 2017-11-02 | Discharge: 2017-11-02 | Disposition: A | Discharge status: Home / Self Care | Payer: Medicare Other | Source: Ambulatory Visit | Attending: Cardiology | Admitting: Cardiology

## 2017-11-02 DIAGNOSIS — Z79899 Other long term (current) drug therapy: Secondary | ICD-10-CM | POA: Diagnosis not present

## 2017-11-02 DIAGNOSIS — Z951 Presence of aortocoronary bypass graft: Secondary | ICD-10-CM

## 2017-11-02 DIAGNOSIS — Z7984 Long term (current) use of oral hypoglycemic drugs: Secondary | ICD-10-CM | POA: Diagnosis not present

## 2017-11-02 DIAGNOSIS — Z7982 Long term (current) use of aspirin: Secondary | ICD-10-CM | POA: Diagnosis not present

## 2017-11-04 ENCOUNTER — Encounter (HOSPITAL_COMMUNITY)
Admission: RE | Admit: 2017-11-04 | Discharge: 2017-11-04 | Disposition: A | Discharge status: Home / Self Care | Payer: Medicare Other | Source: Ambulatory Visit | Attending: Cardiology | Admitting: Cardiology

## 2017-11-04 DIAGNOSIS — Z7982 Long term (current) use of aspirin: Secondary | ICD-10-CM | POA: Diagnosis not present

## 2017-11-04 DIAGNOSIS — Z7984 Long term (current) use of oral hypoglycemic drugs: Secondary | ICD-10-CM | POA: Diagnosis not present

## 2017-11-04 DIAGNOSIS — Z951 Presence of aortocoronary bypass graft: Secondary | ICD-10-CM

## 2017-11-04 DIAGNOSIS — Z79899 Other long term (current) drug therapy: Secondary | ICD-10-CM | POA: Diagnosis not present

## 2017-11-07 ENCOUNTER — Encounter (HOSPITAL_COMMUNITY)
Admission: RE | Admit: 2017-11-07 | Discharge: 2017-11-07 | Disposition: A | Discharge status: Home / Self Care | Payer: Medicare Other | Source: Ambulatory Visit | Attending: Cardiology | Admitting: Cardiology

## 2017-11-07 DIAGNOSIS — Z7984 Long term (current) use of oral hypoglycemic drugs: Secondary | ICD-10-CM | POA: Diagnosis not present

## 2017-11-07 DIAGNOSIS — Z951 Presence of aortocoronary bypass graft: Secondary | ICD-10-CM | POA: Diagnosis not present

## 2017-11-07 DIAGNOSIS — Z79899 Other long term (current) drug therapy: Secondary | ICD-10-CM | POA: Diagnosis not present

## 2017-11-07 DIAGNOSIS — Z7982 Long term (current) use of aspirin: Secondary | ICD-10-CM | POA: Diagnosis not present

## 2017-11-08 NOTE — Progress Notes (Signed)
Cardiac Individual Treatment Plan  Patient Details  Name: ZAEDYN COVIN MRN: [id number] Date of Birth: 09/22/1945 Referring Provider:     CARDIAC REHAB PHASE II ORIENTATION from 09/06/2017 in [facility]  Referring Provider  Ena Dawley, MD.      Initial Encounter Date:    CARDIAC REHAB PHASE II ORIENTATION from 09/06/2017 in [facility]  Date  09/06/17  Referring Provider  Ena Dawley, MD.      Visit Diagnosis: S/P CABG (coronary artery bypass graft)  Patient's Home Medications on Admission:  Current Outpatient Medications:  .  amLODipine (NORVASC) 10 MG tablet, Take 1 tablet (10 mg total) by mouth daily., Disp: 90 tablet, Rfl: 1 .  aspirin EC 81 MG tablet, Take 1 tablet (81 mg total) by mouth daily., Disp: 90 tablet, Rfl: 3 .  atorvastatin (LIPITOR) 80 MG tablet, Take 1 tablet (80 mg total) by mouth daily at 6 PM., Disp: 90 tablet, Rfl: 1 .  colchicine 0.6 MG tablet, Take 1-2 tablets (0.6-1.2 mg total) by mouth daily as needed (for gout flare). 2 po at onset of flair and then repeat in 1 h for acute gout attack then 1 po qd until pain free, Disp: 30 tablet, Rfl: 0 .  metFORMIN (GLUCOPHAGE) 500 MG tablet, Take 500 mg by mouth 2 (two) times daily with a meal., Disp: , Rfl:  .  metoprolol tartrate (LOPRESSOR) 50 MG tablet, Take 1 tablet (50 mg total) by mouth 2 (two) times daily., Disp: 180 tablet, Rfl: 1  Past Medical History: Past Medical History:  Diagnosis Date  . Arthritis    "knees" (07/11/2017)  . CKD (chronic kidney disease), stage III ([place])   . Coronary artery disease    a.  CABGx3 in 05/2017.  [facility] Gout   . Heart murmur   . High cholesterol   . Hypertension   . Mild aortic stenosis   . Mild mitral regurgitation   . OSA on CPAP   . Postoperative atrial fibrillation ([place]) 06/2017  . Type II diabetes mellitus ([facility])     Tobacco Use: Social History   Tobacco Use  Smoking Status Former Smoker   . Packs/day: 2.00  . Years: 15.00  . Pack years: 30.00  . Types: Cigarettes  . Last attempt to quit: 07/26/1998  . Years since quitting: 19.3  Smokeless Tobacco Never Used    Labs: Recent Chemical engineer    Labs for ITP Cardiac and Pulmonary Rehab Latest Ref Rng & Units 06/24/2017 06/24/2017 06/24/2017 06/25/2017 09/27/2017   Cholestrol 100 - 199 mg/dL - - - - -   LDLCALC 0 - 99 mg/dL - - - - -   HDL >39 mg/dL - - - - -   Trlycerides 0 - 149 mg/dL - - - - -   Hemoglobin A1c 4.8 - 5.6 % - - - - 7.0(H)   PHART 7.350 - 7.450 7.374 - 7.361 - -   PCO2ART 32.0 - 48.0 mmHg 45.1 - 46.4 - -   HCO3 20.0 - 28.0 mmol/L 26.2 - 26.2 - -   TCO2 22 - 32 mmol/L 28 24 28 24  -   O2SAT % 93.0 - 94.0 - -      Capillary Blood Glucose: Lab Results  Component Value Date   GLUCAP 108 (H) 09/16/2017   GLUCAP 136 (H) 09/16/2017   GLUCAP 142 (H) 09/14/2017   GLUCAP 137 (H) 09/14/2017   GLUCAP 228 (H) 09/12/2017  Exercise Target Goals:    Exercise Program Goal: Individual exercise prescription set using results from initial 6 min walk test and THRR while considering  patient's activity barriers and safety.   Exercise Prescription Goal: Initial exercise prescription builds to 30-45 minutes a day of aerobic activity, 2-3 days per week.  Home exercise guidelines will be given to patient during program as part of exercise prescription that the participant will acknowledge.  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 Right knee pain. Bilateral hip pain with walking.    Cardiac Risk Stratification  High       6 Minute Walk: 6 Minute Walk    Row Name 09/06/17 1146         6 Minute Walk   Phase  Initial     Distance  839 feet     Walk Time  6 minutes     # of Rest Breaks   0     MPH  1.59     METS  1.87     RPE  13     VO2 Peak  6.54     Symptoms  Yes (comment)     Comments  Patient c/o knee pain, 6/10 on the pain scale.     Resting HR  72 bpm     Resting BP  120/80     Resting Oxygen Saturation   98 %     Exercise Oxygen Saturation  during 6 min walk  99 %     Max Ex. HR  89 bpm     Max Ex. BP  160/78     2 Minute Post BP  120/80        Oxygen Initial Assessment:   Oxygen Re-Evaluation:   Oxygen Discharge (Final Oxygen Re-Evaluation):   Initial Exercise Prescription: Initial Exercise Prescription - 09/06/17 1100      Date of Initial Exercise RX and Referring Provider   Date  09/06/17    Referring Provider  Ena Dawley, MD.      Recumbant Bike   Level  3    Minutes  10    METs  2      NuStep   Level  3    SPM  85    Minutes  10    METs  2      Arm Ergometer   Level  3    RPM  30    Minutes  10      Prescription Details   Frequency (times per week)  3    Duration  Progress to 30 minutes of continuous aerobic without signs/symptoms of physical distress      Intensity   THRR 40-80% of Max Heartrate  60-120    Ratings of Perceived Exertion  11-13    Perceived Dyspnea  --      Progression   Progression  Continue to progress workloads to maintain intensity without signs/symptoms of physical distress.      Resistance Training   Training Prescription  Yes    Weight  3lbs.    Reps  10-15       Perform Capillary Blood Glucose checks as needed.  Exercise Prescription Changes: Exercise Prescription Changes    Row Name 09/12/17 [phone] 09/19/17 [phone] 10/07/17 [phone]  10/17/17 1010 10/31/17 0953     Response to Exercise   Blood Pressure (Admit)  128/80  136/80  110/70  120/78  120/80   Blood Pressure (Exercise)  148/74  148/70  122/70  118/62  132/72   Blood Pressure (Exit)  122/80  118/62  112/70  112/72  110/60   Heart Rate (Admit)  88 bpm  87 bpm  79 bpm  83 bpm  80 bpm   Heart Rate (Exercise)  88 bpm  88 bpm  90 bpm   110 bpm  110 bpm   Heart Rate (Exit)  75 bpm  71 bpm  73 bpm  80 bpm  83 bpm   Rating of Perceived Exertion (Exercise)  13  11  11  12  12    Symptoms  none  none  none  none  none   Duration  Continue with 30 min of aerobic exercise without signs/symptoms of physical distress.  Continue with 30 min of aerobic exercise without signs/symptoms of physical distress.  Continue with 45 min of aerobic exercise without signs/symptoms of physical distress.  Continue with 45 min of aerobic exercise without signs/symptoms of physical distress.  Continue with 45 min of aerobic exercise without signs/symptoms of physical distress.   Intensity  THRR unchanged  THRR unchanged  THRR unchanged  THRR unchanged  THRR unchanged     Progression   Progression  Continue to progress workloads to maintain intensity without signs/symptoms of physical distress.  Continue to progress workloads to maintain intensity without signs/symptoms of physical distress.  Continue to progress workloads to maintain intensity without signs/symptoms of physical distress.  Continue to progress workloads to maintain intensity without signs/symptoms of physical distress.  Continue to progress workloads to maintain intensity without signs/symptoms of physical distress.   Average METs  1.9  2.1  2.6  2.5  2.7     Resistance Training   Training Prescription  Yes  Yes  Yes  Yes  Yes   Weight  3lbs.  3lbs.  3lbs.  3lbs.  3lbs.   Reps  10-15  10-15  10-15  10-15  10-15   Time  10 Minutes  10 Minutes  10 Minutes  10 Minutes  10 Minutes     Interval Training   Interval Training  No  No  No  No  No     Recumbant Bike   Level  3  3  4   4.5  4.5   Minutes  10  10  10  10  10    METs  1.5  2.2  2.8  2.6  2.8     NuStep   Level  3  3  4  5  6    SPM  85  85  85  85  85   Minutes  10  10  10  10  10    METs  2.3  2  3   2.8  3.2     Arm Ergometer   Level  3  3  4 16  watts, 1.98 METs  4.5 13 watts, 1.96 METs  4.5 13 watts, 1.96 METs   RPM  30  30   30  13  14    Minutes  10  10  10  10  10      Home Exercise Plan   Plans to continue exercise at  [employer] (comment)  Forensic scientist (comment)  Forensic scientist (comment)  Forensic scientist (comment)  Forensic scientist (comment)  Frequency  -  Add 1 additional day to program exercise sessions.  Add 2 additional days to program exercise sessions.  Add 2 additional days to program exercise sessions.  Add 2 additional days to program exercise sessions.   Initial Home Exercises Provided  -  09/16/17  09/16/17  09/16/17  09/16/17      Exercise Comments: Exercise Comments    Row Name 09/12/17 0950 09/16/17 1010 09/19/17 0953 10/10/17 1015 10/17/17 1010   Exercise Comments  Off to a good start with exercise.  Reviewed home exercise guidelines, METs and goals with patient.  Reviewed METs with patient.  Reviewed METs and goals with patient.  Reviewed METs with patient.   [place] Name 10/31/17 [phone]           Exercise Comments  Reviewed METs and goals with patient.          Exercise Goals and Review: Exercise Goals    Row Name 09/06/17 1152             Exercise Goals   Increase Physical Activity  Yes       Intervention  Provide advice, education, support and counseling about physical activity/exercise needs.;Develop an individualized exercise prescription for aerobic and resistive training based on initial evaluation findings, risk stratification, comorbidities and participant's personal goals.       Expected Outcomes  Long Term: Exercising regularly at least 3-5 days a week.;Long Term: Add in home exercise to make exercise part of routine and to increase amount of physical activity.       Increase Strength and Stamina  Yes       Intervention  Provide advice, education, support and counseling about physical activity/exercise needs.;Develop an individualized exercise prescription for aerobic and resistive training based on initial evaluation findings, risk stratification,  comorbidities and participant's personal goals.       Expected Outcomes  Short Term: Increase workloads from initial exercise prescription for resistance, speed, and METs.;Short Term: Perform resistance training exercises routinely during rehab and add in resistance training at home;Long Term: Improve cardiorespiratory fitness, muscular endurance and strength as measured by increased METs and functional capacity (6MWT)       Able to understand and use rate of perceived exertion (RPE) scale  Yes       Intervention  Provide education and explanation on how to use RPE scale       Expected Outcomes  Short Term: Able to use RPE daily in rehab to express subjective intensity level;Long Term:  Able to use RPE to guide intensity level when exercising independently       Knowledge and understanding of Target Heart Rate Range (THRR)  Yes       Intervention  Provide education and explanation of THRR including how the numbers were predicted and where they are located for reference       Expected Outcomes  Short Term: Able to state/look up THRR;Long Term: Able to use THRR to govern intensity when exercising independently;Short Term: Able to use daily as guideline for intensity in rehab       Able to check pulse independently  Yes       Intervention  Provide education and demonstration on how to check pulse in carotid and radial arteries.;Review the importance of being able to check your own pulse for safety during independent exercise       Expected Outcomes  Short Term: Able to explain why pulse checking is important during independent exercise;Long Term: Able to check pulse  independently and accurately       Understanding of Exercise Prescription  Yes       Intervention  Provide education, explanation, and written materials on patient's individual exercise prescription       Expected Outcomes  Short Term: Able to explain program exercise prescription;Long Term: Able to explain home exercise prescription to  exercise independently          Exercise Goals Re-Evaluation : Exercise Goals Re-Evaluation    Row Name 09/12/17 1408 09/16/17 1010 10/10/17 1015 10/31/17 0953       Exercise Goal Re-Evaluation   Exercise Goals Review  Able to understand and use rate of perceived exertion (RPE) scale  Able to understand and use rate of perceived exertion (RPE) scale;Understanding of Exercise Prescription;Knowledge and understanding of Target Heart Rate Range (THRR)  Understanding of Exercise Prescription  Understanding of Exercise Prescription    Comments  Patient tolerated first full day of exercise well without c/o. Pt able to use RPE scale appropriately.  Reviewed home exercise guidelines with patient including THRR, RPE scale and endpoints for exercise. [facility]week primarily riding the recumbent bike and using weight machines. Encouraged pt to [facility] at least [facility].  [facility], [facility] 30-50 minutes, 2 days/week in addition to exercise at [facility].  Patient continues to progress well with exercise at [facility] and still exercising 30-4minutes, [facility]    Expected Outcomes  Increase workloads as tolerated to achieve health and fitness goals.  [facility] will [facility] to help [facility] health and fitness goals.  Continue exercise at least 30 minutes, 5 days/[facility] and [facility] to increase strength and stamina.  Increase workloads on RB and Nustep to help achieve health and fitness goals.        Discharge Exercise Prescription (Final Exercise Prescription Changes): Exercise Prescription Changes - 10/31/17 0953      Response to Exercise   Blood Pressure (Admit)  120/80    Blood Pressure (Exercise)  132/72    Blood Pressure (Exit)  110/60    Heart Rate (Admit)  80 bpm    Heart Rate (Exercise)  110 bpm    Heart Rate (Exit)  83 bpm    Rating of Perceived Exertion  (Exercise)  12    Symptoms  none    Duration  Continue with 45 min of aerobic exercise without signs/symptoms of physical distress.    Intensity  THRR unchanged      Progression   Progression  Continue to progress workloads to maintain intensity without signs/symptoms of physical distress.    Average METs  2.7      Resistance Training   Training Prescription  Yes    Weight  3lbs.    Reps  10-15    Time  10 Minutes      Interval Training   Interval Training  No      Recumbant Bike   Level  4.5    Minutes  10    METs  2.8      NuStep   Level  6    SPM  85    Minutes  10    METs  3.2      Arm Ergometer   Level  4.5 13 watts, 1.96 METs    RPM  14    Minutes  10      Home Exercise Plan   Plans to continue exercise at  [facility] (comment)    Frequency  Add 2 additional days to program exercise sessions.    Initial Home Exercises Provided  09/16/17       Nutrition:  Target Goals: Understanding of nutrition guidelines, daily intake of sodium 1500mg , cholesterol 200mg , calories 30% from fat and 7% or less from saturated fats, daily to have 5 or more servings of fruits and vegetables.  Biometrics: Pre Biometrics - 09/06/17 1153      Pre Biometrics   Height  5\' 3"  (1.6 m)    Weight  181 lb 7 oz (82.3 kg)    Waist Circumference  39.75 inches    Hip Circumference  40.25 inches    Waist to Hip Ratio  0.99 %    BMI (Calculated)  32.15    Triceps Skinfold  15 mm    % Body Fat  29.8 %    Grip Strength  41.5 kg    Flexibility  14 in    Single Leg Stand  30 seconds        Nutrition Therapy Plan and Nutrition Goals: Nutrition Therapy & Goals - 09/28/17 1135      Nutrition Therapy   Diet  Carb Modified, Heart Healthy      Personal Nutrition Goals   Nutrition Goal  Pt to identify food quantities necessary to achieve weight loss of 6-20lb at graduation from cardiac rehab. Goal wt of 160 lb desired.     Personal Goal #2  Improved blood glucose control as  evidenced by pt's A1c trending from 8.2 to less than 7.0.    Personal Goal #3  Pt able to name foods that affect blood glucose       Intervention Plan   Intervention  Prescribe, educate and counsel regarding individualized specific dietary modifications aiming towards targeted core components such as weight, hypertension, lipid management, diabetes, heart failure and other comorbidities.    Expected Outcomes  Short Term Goal: Understand basic principles of dietary content, such as calories, fat, sodium, cholesterol and nutrients.;Long Term Goal: Adherence to prescribed nutrition plan.       Nutrition Assessments: Nutrition Assessments - 09/28/17 1147      MEDFICTS Scores   Pre Score  81       Nutrition Goals Re-Evaluation:   Nutrition Goals Re-Evaluation:   Nutrition Goals Discharge (Final Nutrition Goals Re-Evaluation):   Psychosocial: Target Goals: Acknowledge presence or absence of significant depression and/or stress, maximize coping skills, provide positive support system. Participant is able to verbalize types and ability to use techniques and skills needed for reducing stress and depression.  Initial Review & Psychosocial Screening: Initial Psych Review & Screening - 09/06/17 1208      Initial Review   Current issues with  None Identified      Family Dynamics   Good Support System?  Yes wife present during orientation    Comments  no psychosocial needsd identified, no interventions necessary       Barriers   Psychosocial barriers to participate in program  There are no identifiable barriers or psychosocial needs.      Screening Interventions   Interventions  Encouraged to exercise       Quality of Life Scores: Quality of Life - 09/12/17 1406      Quality of Life Scores   Health/Function Pre  23.53 %    Socioeconomic Pre  25.33 %  Psych/Spiritual Pre  28.29 %    Family Pre  28.8 %    GLOBAL Pre  25.67 %      Scores of 19 and below usually indicate a  poorer quality of life in these areas.  A difference of  2-3 points is a clinically meaningful difference.  A difference of 2-3 points in the total score of the Quality of Life Index has been associated with significant improvement in overall quality of life, self-image, physical symptoms, and general health in studies assessing change in quality of life.  PHQ-9: Recent Review Flowsheet Data    Depression screen [facility] 2/9 09/27/2017 08/16/2017 07/30/2017 07/11/2017 07/07/2017   Decreased Interest 0 0 0 0 0   Down, Depressed, Hopeless 0 0 0 0 0   PHQ - 2 Score 0 0 0 0 0     Interpretation of Total Score  Total Score Depression Severity:  1-4 = Minimal depression, 5-9 = Mild depression, 10-14 = Moderate depression, 15-19 = Moderately severe depression, 20-27 = Severe depression   Psychosocial Evaluation and Intervention:   Psychosocial Re-Evaluation: Psychosocial Re-Evaluation    [place] Name 10/13/17 1022 11/08/17 1508           Psychosocial Re-Evaluation   Current issues with  None Identified  None Identified      Interventions  Encouraged to attend Cardiac Rehabilitation for the exercise  Encouraged to attend Cardiac Rehabilitation for the exercise      Continue Psychosocial Services   No Follow up required  No Follow up required         Psychosocial Discharge (Final Psychosocial Re-Evaluation): Psychosocial Re-Evaluation - 11/08/17 1508      Psychosocial Re-Evaluation   Current issues with  None Identified    Interventions  Encouraged to attend Cardiac Rehabilitation for the exercise    Continue Psychosocial Services   No Follow up required       Vocational Rehabilitation: Provide vocational rehab assistance to qualifying candidates.   Vocational Rehab Evaluation & Intervention:   Education: Education Goals: Education classes will be provided on a weekly basis, covering required topics. Participant will state understanding/return demonstration of topics presented.  Learning  Barriers/Preferences: Learning Barriers/Preferences - 09/06/17 1155      Learning Barriers/Preferences   Learning Barriers  None    Learning Preferences  None       Education Topics: Count Your Pulse:  -Group instruction provided by verbal instruction, demonstration, patient participation and written materials to support subject.  Instructors address importance of being able to find your pulse and how to count your pulse when at home without a heart monitor.  Patients get hands on experience counting their pulse with staff help and individually.   Heart Attack, Angina, and Risk Factor Modification:  -Group instruction provided by verbal instruction, video, and written materials to support subject.  Instructors address signs and symptoms of angina and heart attacks.    Also discuss risk factors for heart disease and how to make changes to improve heart health risk factors.   Functional Fitness:  -Group instruction provided by verbal instruction, demonstration, patient participation, and written materials to support subject.  Instructors address safety measures for doing things around the house.  Discuss how to get up and down off the floor, how to pick things up properly, how to safely get out of a chair without assistance, and balance training.   Meditation and Mindfulness:  -Group instruction provided by verbal instruction, patient participation, and written materials to support subject.  Instructor addresses importance of mindfulness and meditation practice to help reduce stress and improve awareness.  Instructor also leads participants through a meditation exercise.    Stretching for Flexibility and Mobility:  -Group instruction provided by verbal instruction, patient participation, and written materials to support subject.  Instructors lead participants through series of stretches that are designed to increase flexibility thus improving mobility.  These stretches are additional  exercise for major muscle groups that are typically performed during regular warm up and cool down.   Hands Only CPR:  -Group verbal, video, and participation provides a basic overview of AHA guidelines for community CPR. Role-play of emergencies allow participants the opportunity to practice calling for help and chest compression technique with discussion of AED use.   Hypertension: -Group verbal and written instruction that provides a basic overview of hypertension including the most recent diagnostic guidelines, risk factor reduction with self-care instructions and medication management.    Nutrition I class: Heart Healthy Eating:  -Group instruction provided by PowerPoint slides, verbal discussion, and written materials to support subject matter. The instructor gives an explanation and review of the Therapeutic Lifestyle Changes diet recommendations, which includes a discussion on lipid goals, dietary fat, sodium, fiber, plant stanol/sterol esters, sugar, and the components of a well-balanced, healthy diet.   Nutrition II class: Lifestyle Skills:  -Group instruction provided by PowerPoint slides, verbal discussion, and written materials to support subject matter. The instructor gives an explanation and review of label reading, grocery shopping for heart health, heart healthy recipe modifications, and ways to make healthier choices when eating out.   Diabetes Question & Answer:  -Group instruction provided by PowerPoint slides, verbal discussion, and written materials to support subject matter. The instructor gives an explanation and review of diabetes co-morbidities, pre- and post-prandial blood glucose goals, pre-exercise blood glucose goals, signs, symptoms, and treatment of hypoglycemia and hyperglycemia, and foot care basics.   Diabetes Blitz:  -Group instruction provided by PowerPoint slides, verbal discussion, and written materials to support subject matter. The instructor gives an  explanation and review of the physiology behind type 1 and type 2 diabetes, diabetes medications and rational behind using different medications, pre- and post-prandial blood glucose recommendations and Hemoglobin A1c goals, diabetes diet, and exercise including blood glucose guidelines for exercising safely.    Portion Distortion:  -Group instruction provided by PowerPoint slides, verbal discussion, written materials, and food models to support subject matter. The instructor gives an explanation of serving size versus portion size, changes in portions sizes over the last 20 years, and what consists of a serving from each food group.   Stress Management:  -Group instruction provided by verbal instruction, video, and written materials to support subject matter.  Instructors review role of stress in heart disease and how to cope with stress positively.     Exercising on Your Own:  -Group instruction provided by verbal instruction, power point, and written materials to support subject.  Instructors discuss benefits of exercise, components of exercise, frequency and intensity of exercise, and end points for exercise.  Also discuss use of nitroglycerin and activating EMS.  Review options of places to exercise outside of rehab.  Review guidelines for sex with heart disease.   Cardiac Drugs I:  -Group instruction provided by verbal instruction and written materials to support subject.  Instructor reviews cardiac drug classes: antiplatelets, anticoagulants, beta blockers, and statins.  Instructor discusses reasons, side effects, and lifestyle considerations for each drug class.   Cardiac Drugs II:  -Group instruction  provided by verbal instruction and written materials to support subject.  Instructor reviews cardiac drug classes: angiotensin converting enzyme inhibitors (ACE-I), angiotensin II receptor blockers (ARBs), nitrates, and calcium channel blockers.  Instructor discusses reasons, side effects,  and lifestyle considerations for each drug class.   Anatomy and Physiology of the Circulatory System:  Group verbal and written instruction and models provide basic cardiac anatomy and physiology, with the coronary electrical and arterial systems. Review of: AMI, Angina, Valve disease, Heart Failure, Peripheral Artery Disease, Cardiac Arrhythmia, Pacemakers, and the ICD.   Other Education:  -Group or individual verbal, written, or video instructions that support the educational goals of the cardiac rehab program.   Holiday Eating Survival Tips:  -Group instruction provided by PowerPoint slides, verbal discussion, and written materials to support subject matter. The instructor gives patients tips, tricks, and techniques to help them not only survive but enjoy the holidays despite the onslaught of food that accompanies the holidays.   Knowledge Questionnaire Score: Knowledge Questionnaire Score - 09/12/17 1406      Knowledge Questionnaire Score   Pre Score  20/24       Core Components/Risk Factors/Patient Goals at Admission: Personal Goals and Risk Factors at Admission - 09/06/17 1141      Core Components/Risk Factors/Patient Goals on Admission    Weight Management  Yes;Obesity    Intervention  Weight Management/Obesity: Establish reasonable short term and long term weight goals.;Obesity: Provide education and appropriate resources to help participant work on and attain dietary goals.    Admit Weight  181 lb 7 oz (82.3 kg)    Goal Weight: Short Term  175 lb (79.4 kg)    Goal Weight: Long Term  160 lb (72.6 kg)    Expected Outcomes  Short Term: Continue to assess and modify interventions until short term weight is achieved;Long Term: Adherence to nutrition and physical activity/exercise program aimed toward attainment of established weight goal;Weight Loss: Understanding of general recommendations for a balanced deficit meal plan, which promotes 1-2 lb weight loss per week and includes a  negative energy balance of [id number] kcal/d    Diabetes  Yes    Intervention  Provide education about signs/symptoms and action to take for hypo/hyperglycemia.;Provide education about proper nutrition, including hydration, and aerobic/resistive exercise prescription along with prescribed medications to achieve blood glucose in normal ranges: Fasting glucose 65-99 mg/dL    Expected Outcomes  Short Term: Participant verbalizes understanding of the signs/symptoms and immediate care of hyper/hypoglycemia, proper foot care and importance of medication, aerobic/resistive exercise and nutrition plan for blood glucose control.;Long Term: Attainment of HbA1C < 7%.    Hypertension  Yes    Intervention  Provide education on lifestyle modifcations including regular physical activity/exercise, weight management, moderate sodium restriction and increased consumption of fresh fruit, vegetables, and low fat dairy, alcohol moderation, and smoking cessation.;Monitor prescription use compliance.    Expected Outcomes  Short Term: Continued assessment and intervention until BP is < 140/37mm HG in hypertensive participants. < 130/51mm HG in hypertensive participants with diabetes, heart failure or chronic kidney disease.;Long Term: Maintenance of blood pressure at goal levels.    Lipids  Yes    Intervention  Provide education and support for participant on nutrition & aerobic/resistive exercise along with prescribed medications to achieve LDL 70mg , HDL >40mg .    Expected Outcomes  Short Term: Participant states understanding of desired cholesterol values and is compliant with medications prescribed. Participant is following exercise prescription and nutrition guidelines.;Long Term: Cholesterol controlled with medications as  prescribed, with individualized exercise RX and with personalized nutrition plan. Value goals: LDL < 70mg , HDL > 40 mg.       Core Components/Risk Factors/Patient Goals Review:  Goals and Risk Factor  Review    Row Name 10/13/17 1021 11/08/17 1507           Core Components/Risk Factors/Patient Goals Review   Personal Goals Review  Weight Management/Obesity;Lipids;Hypertension;Diabetes  Weight Management/Obesity;Lipids;Hypertension;Diabetes      Review  Jammy's vital signs and CBG's have been stable at cardiac rehab  Shah's vital signs and CBG's have been stable at cardiac rehab      Expected Outcomes  Atzel will continue to take his medicaitons as presribed and exercise at phase 2 cardiac rehab  Kongmeng will continue to take his medicaitons as presribed and exercise at phase 2 cardiac rehab         Core Components/Risk Factors/Patient Goals at Discharge (Final Review):  Goals and Risk Factor Review - 11/08/17 1507      Core Components/Risk Factors/Patient Goals Review   Personal Goals Review  Weight Management/Obesity;Lipids;Hypertension;Diabetes    Review  Terrin's vital signs and CBG's have been stable at cardiac rehab    Expected Outcomes  Elliott will continue to take his medicaitons as presribed and exercise at phase 2 cardiac rehab       ITP Comments: ITP Comments    Row Name 09/06/17 0919 09/13/17 1542 10/13/17 1018 11/08/17 1507     ITP Comments  Dr. Fransico Him, Medical Director   30 Day ITP Review. Patient is off to a good start to exercise this week.  30 day ITP review. Patient with good attendance and participation in phase 2 cardiac rehab  30 day ITP review. Patient with good attendance and participation in phase 2 cardiac rehab       Comments: See ITP comments.Barnet Pall, RN,BSN 11/08/2017 3:10 PM

## 2017-11-09 ENCOUNTER — Encounter (HOSPITAL_COMMUNITY)
Admission: RE | Admit: 2017-11-09 | Discharge: 2017-11-09 | Disposition: A | Discharge status: Home / Self Care | Payer: Medicare Other | Source: Ambulatory Visit | Attending: Cardiology | Admitting: Cardiology

## 2017-11-09 DIAGNOSIS — Z7984 Long term (current) use of oral hypoglycemic drugs: Secondary | ICD-10-CM | POA: Diagnosis not present

## 2017-11-09 DIAGNOSIS — Z951 Presence of aortocoronary bypass graft: Secondary | ICD-10-CM

## 2017-11-09 DIAGNOSIS — Z7982 Long term (current) use of aspirin: Secondary | ICD-10-CM | POA: Diagnosis not present

## 2017-11-09 DIAGNOSIS — Z79899 Other long term (current) drug therapy: Secondary | ICD-10-CM | POA: Diagnosis not present

## 2017-11-11 ENCOUNTER — Encounter (HOSPITAL_COMMUNITY)
Admission: RE | Admit: 2017-11-11 | Discharge: 2017-11-11 | Disposition: A | Discharge status: Home / Self Care | Payer: Medicare Other | Source: Ambulatory Visit | Attending: Cardiology | Admitting: Cardiology

## 2017-11-11 DIAGNOSIS — Z7984 Long term (current) use of oral hypoglycemic drugs: Secondary | ICD-10-CM | POA: Diagnosis not present

## 2017-11-11 DIAGNOSIS — Z79899 Other long term (current) drug therapy: Secondary | ICD-10-CM | POA: Diagnosis not present

## 2017-11-11 DIAGNOSIS — Z7982 Long term (current) use of aspirin: Secondary | ICD-10-CM | POA: Diagnosis not present

## 2017-11-11 DIAGNOSIS — Z951 Presence of aortocoronary bypass graft: Secondary | ICD-10-CM

## 2017-11-14 ENCOUNTER — Encounter (HOSPITAL_COMMUNITY)
Admission: RE | Admit: 2017-11-14 | Discharge: 2017-11-14 | Disposition: A | Discharge status: Home / Self Care | Payer: Medicare Other | Source: Ambulatory Visit | Attending: Cardiology | Admitting: Cardiology

## 2017-11-14 DIAGNOSIS — Z7984 Long term (current) use of oral hypoglycemic drugs: Secondary | ICD-10-CM | POA: Diagnosis not present

## 2017-11-14 DIAGNOSIS — Z79899 Other long term (current) drug therapy: Secondary | ICD-10-CM | POA: Diagnosis not present

## 2017-11-14 DIAGNOSIS — Z951 Presence of aortocoronary bypass graft: Secondary | ICD-10-CM | POA: Diagnosis not present

## 2017-11-14 DIAGNOSIS — Z7982 Long term (current) use of aspirin: Secondary | ICD-10-CM | POA: Diagnosis not present

## 2017-11-16 ENCOUNTER — Encounter (HOSPITAL_COMMUNITY): Payer: Medicare Other

## 2017-11-17 ENCOUNTER — Other Ambulatory Visit: Payer: Self-pay | Admitting: Physician Assistant

## 2017-11-17 DIAGNOSIS — I2510 Atherosclerotic heart disease of native coronary artery without angina pectoris: Secondary | ICD-10-CM

## 2017-11-18 ENCOUNTER — Encounter (HOSPITAL_COMMUNITY): Payer: Medicare Other

## 2017-11-21 ENCOUNTER — Encounter (HOSPITAL_COMMUNITY)
Admission: RE | Admit: 2017-11-21 | Discharge: 2017-11-21 | Disposition: A | Discharge status: Home / Self Care | Payer: Medicare Other | Source: Ambulatory Visit | Attending: Cardiology | Admitting: Cardiology

## 2017-11-21 DIAGNOSIS — Z7984 Long term (current) use of oral hypoglycemic drugs: Secondary | ICD-10-CM | POA: Diagnosis not present

## 2017-11-21 DIAGNOSIS — Z7982 Long term (current) use of aspirin: Secondary | ICD-10-CM | POA: Diagnosis not present

## 2017-11-21 DIAGNOSIS — Z951 Presence of aortocoronary bypass graft: Secondary | ICD-10-CM

## 2017-11-21 DIAGNOSIS — Z79899 Other long term (current) drug therapy: Secondary | ICD-10-CM | POA: Diagnosis not present

## 2017-11-22 ENCOUNTER — Other Ambulatory Visit: Payer: Self-pay | Admitting: Neurology

## 2017-11-22 DIAGNOSIS — Z9989 Dependence on other enabling machines and devices: Principal | ICD-10-CM

## 2017-11-22 DIAGNOSIS — G4733 Obstructive sleep apnea (adult) (pediatric): Secondary | ICD-10-CM

## 2017-11-23 ENCOUNTER — Encounter (HOSPITAL_COMMUNITY)
Admission: RE | Admit: 2017-11-23 | Discharge: 2017-11-23 | Disposition: A | Discharge status: Home / Self Care | Payer: Medicare Other | Source: Ambulatory Visit | Attending: Cardiology | Admitting: Cardiology

## 2017-11-23 DIAGNOSIS — Z79899 Other long term (current) drug therapy: Secondary | ICD-10-CM | POA: Diagnosis not present

## 2017-11-23 DIAGNOSIS — Z951 Presence of aortocoronary bypass graft: Secondary | ICD-10-CM | POA: Diagnosis not present

## 2017-11-23 DIAGNOSIS — Z7984 Long term (current) use of oral hypoglycemic drugs: Secondary | ICD-10-CM | POA: Insufficient documentation

## 2017-11-23 DIAGNOSIS — Z7982 Long term (current) use of aspirin: Secondary | ICD-10-CM | POA: Insufficient documentation

## 2017-11-24 ENCOUNTER — Other Ambulatory Visit: Payer: Self-pay | Admitting: Neurology

## 2017-11-24 ENCOUNTER — Telehealth: Payer: Self-pay | Admitting: Neurology

## 2017-11-24 DIAGNOSIS — Z9989 Dependence on other enabling machines and devices: Principal | ICD-10-CM

## 2017-11-24 DIAGNOSIS — G4733 Obstructive sleep apnea (adult) (pediatric): Secondary | ICD-10-CM

## 2017-11-24 NOTE — Telephone Encounter (Signed)
Pt called requesting a call back stating he would like his pressure decreased back down to 8. Stating moving it up to 11 has been to high for him. Please call to advise

## 2017-11-24 NOTE — Telephone Encounter (Signed)
Called pt no answer. LVM for the pt to call back. Dr Dohmeier will decrease patient's pressure to 10 cm of water pressure. When the patient's current pressure set at 8 he was still having apenas about 7 times an hour. Upon his auto titration trial we noted the patient's pressure and apnea was treated well at a pressure of 10. We will send orders to decrease to 10 to [facility]

## 2017-11-25 ENCOUNTER — Encounter (HOSPITAL_COMMUNITY)
Admission: RE | Admit: 2017-11-25 | Discharge: 2017-11-25 | Disposition: A | Discharge status: Home / Self Care | Payer: Medicare Other | Source: Ambulatory Visit | Attending: Cardiology | Admitting: Cardiology

## 2017-11-25 DIAGNOSIS — Z7984 Long term (current) use of oral hypoglycemic drugs: Secondary | ICD-10-CM | POA: Diagnosis not present

## 2017-11-25 DIAGNOSIS — Z951 Presence of aortocoronary bypass graft: Secondary | ICD-10-CM | POA: Diagnosis not present

## 2017-11-25 DIAGNOSIS — Z7982 Long term (current) use of aspirin: Secondary | ICD-10-CM | POA: Diagnosis not present

## 2017-11-25 DIAGNOSIS — Z79899 Other long term (current) drug therapy: Secondary | ICD-10-CM | POA: Diagnosis not present

## 2017-11-25 NOTE — Telephone Encounter (Signed)
Pt called back. I educated him on the reason the pressure was increased to 11 cm of water pressure. Pt states that when he did the auto trial he was unable to tolerate it and he even turned it in before the two weeks was up. I informed him that the data we ha gotten showed where he used it 14 days and the pressure that was at 10 was what was treating the apnea at its best. I informed him with the pressure being set at 8 cm it is not fully treating his apnea. He was still having about 7-8 apneas at that pressure. Our goal is to treat his apnea completely by increasing the pressure. I have informed the pt that [facility] has the new order and hopefully the pressure will be decreased to 10 cm. I have asked for the pt to attempt to use the machine at the pressure set at 10 and see if he is able to tolerate it. Pt will give [place] a call on monday if he finds through the weekend he is unable to tolerate and then I will let Dr Brett Fairy know that he at least tried it. Pt verbalized understanding.

## 2017-11-25 NOTE — Telephone Encounter (Signed)
Pt returned RN's call. Per skype she advised:  just make him aware we lowered his cpap pressure to 10 instead of 8 because that was the pressure he was needing to treat his apnea. if he request a call back i will after finish here.  Pt is still requesting a call back . Pt said he was going to cardiologist appt and would be there for about 2 hours and he would call RN back then, he is aware RN maybe with patients and the clinic closes at noon today.

## 2017-11-28 ENCOUNTER — Encounter (HOSPITAL_COMMUNITY)
Admission: RE | Admit: 2017-11-28 | Discharge: 2017-11-28 | Disposition: A | Discharge status: Home / Self Care | Payer: Medicare Other | Source: Ambulatory Visit | Attending: Cardiology | Admitting: Cardiology

## 2017-11-28 DIAGNOSIS — Z951 Presence of aortocoronary bypass graft: Secondary | ICD-10-CM | POA: Diagnosis not present

## 2017-11-28 DIAGNOSIS — Z7984 Long term (current) use of oral hypoglycemic drugs: Secondary | ICD-10-CM | POA: Diagnosis not present

## 2017-11-28 DIAGNOSIS — Z79899 Other long term (current) drug therapy: Secondary | ICD-10-CM | POA: Diagnosis not present

## 2017-11-28 DIAGNOSIS — Z7982 Long term (current) use of aspirin: Secondary | ICD-10-CM | POA: Diagnosis not present

## 2017-11-28 NOTE — Telephone Encounter (Signed)
Pt states that after trying this pressure through the weekend he states Friday his night went well but sat and Sunday he was unable to tolerate the pressure stating that was burning his nose. Pt stopped the machine both nights and had to restart in order for it to start lower and work up. He doesn't like his sleep getting interrupted and would much rather go back to 8 cm of water pressure which was worked better for him as far as comfort.

## 2017-11-28 NOTE — Addendum Note (Signed)
Addended by: Darleen Crocker on: 11/28/2017 05:03 PM   Modules accepted: Orders

## 2017-11-28 NOTE — Telephone Encounter (Signed)
Pt called with results from CPAP. He can be reached at [phone]

## 2017-11-28 NOTE — Telephone Encounter (Signed)
Called the pt to make him aware that Dr Dohmeier appreciated the attempt to use at the higher pressure. She states that since he was unable to tolerate she was ok with him lowering the pressure back to 8 cm of water pressure. I will update order and send to [facility] for him. Pt verbalized understanding.

## 2017-11-30 ENCOUNTER — Encounter (HOSPITAL_COMMUNITY)
Admission: RE | Admit: 2017-11-30 | Discharge: 2017-11-30 | Disposition: A | Discharge status: Home / Self Care | Payer: Medicare Other | Source: Ambulatory Visit | Attending: Cardiology | Admitting: Cardiology

## 2017-11-30 DIAGNOSIS — Z951 Presence of aortocoronary bypass graft: Secondary | ICD-10-CM | POA: Diagnosis not present

## 2017-11-30 DIAGNOSIS — Z7984 Long term (current) use of oral hypoglycemic drugs: Secondary | ICD-10-CM | POA: Diagnosis not present

## 2017-11-30 DIAGNOSIS — Z7982 Long term (current) use of aspirin: Secondary | ICD-10-CM | POA: Diagnosis not present

## 2017-11-30 DIAGNOSIS — Z79899 Other long term (current) drug therapy: Secondary | ICD-10-CM | POA: Diagnosis not present

## 2017-12-02 ENCOUNTER — Encounter (HOSPITAL_COMMUNITY)
Admission: RE | Admit: 2017-12-02 | Discharge: 2017-12-02 | Disposition: A | Discharge status: Home / Self Care | Payer: Medicare Other | Source: Ambulatory Visit | Attending: Cardiology | Admitting: Cardiology

## 2017-12-02 DIAGNOSIS — Z79899 Other long term (current) drug therapy: Secondary | ICD-10-CM | POA: Diagnosis not present

## 2017-12-02 DIAGNOSIS — Z951 Presence of aortocoronary bypass graft: Secondary | ICD-10-CM | POA: Diagnosis not present

## 2017-12-02 DIAGNOSIS — Z7984 Long term (current) use of oral hypoglycemic drugs: Secondary | ICD-10-CM | POA: Diagnosis not present

## 2017-12-02 DIAGNOSIS — Z7982 Long term (current) use of aspirin: Secondary | ICD-10-CM | POA: Diagnosis not present

## 2017-12-05 ENCOUNTER — Encounter (HOSPITAL_COMMUNITY)
Admission: RE | Admit: 2017-12-05 | Discharge: 2017-12-05 | Disposition: A | Discharge status: Home / Self Care | Payer: Medicare Other | Source: Ambulatory Visit | Attending: Cardiology | Admitting: Cardiology

## 2017-12-05 DIAGNOSIS — Z951 Presence of aortocoronary bypass graft: Secondary | ICD-10-CM

## 2017-12-05 DIAGNOSIS — Z7984 Long term (current) use of oral hypoglycemic drugs: Secondary | ICD-10-CM | POA: Diagnosis not present

## 2017-12-05 DIAGNOSIS — Z79899 Other long term (current) drug therapy: Secondary | ICD-10-CM | POA: Diagnosis not present

## 2017-12-05 DIAGNOSIS — Z7982 Long term (current) use of aspirin: Secondary | ICD-10-CM | POA: Diagnosis not present

## 2017-12-07 ENCOUNTER — Encounter (HOSPITAL_COMMUNITY)
Admission: RE | Admit: 2017-12-07 | Discharge: 2017-12-07 | Disposition: A | Discharge status: Home / Self Care | Payer: Medicare Other | Source: Ambulatory Visit | Attending: Cardiology | Admitting: Cardiology

## 2017-12-07 DIAGNOSIS — Z7984 Long term (current) use of oral hypoglycemic drugs: Secondary | ICD-10-CM | POA: Diagnosis not present

## 2017-12-07 DIAGNOSIS — Z951 Presence of aortocoronary bypass graft: Secondary | ICD-10-CM | POA: Diagnosis not present

## 2017-12-07 DIAGNOSIS — Z7982 Long term (current) use of aspirin: Secondary | ICD-10-CM | POA: Diagnosis not present

## 2017-12-07 DIAGNOSIS — Z79899 Other long term (current) drug therapy: Secondary | ICD-10-CM | POA: Diagnosis not present

## 2017-12-07 NOTE — Progress Notes (Signed)
Discharge Progress Report  Patient Details  Name: Antonio Wells MRN: [id number] Date of Birth: August 09, 1945 Referring Provider:     CARDIAC REHAB PHASE II ORIENTATION from 09/06/2017 in [place]  Referring Provider  Ena Dawley, MD.       Number of Visits: 4  Reason for Discharge:  Patient independent in their exercise. Patient has met program and personal goals.  Smoking History:  Social History   Tobacco Use  Smoking Status Former Smoker  . Packs/day: 2.00  . Years: 15.00  . Pack years: 30.00  . Types: Cigarettes  . Last attempt to quit: 07/26/1998  . Years since quitting: 19.4  Smokeless Tobacco Never Used    Diagnosis:  S/P CABG (coronary artery bypass graft)  ADL UCSD:   Initial Exercise Prescription: Initial Exercise Prescription - 09/06/17 1100      Date of Initial Exercise RX and Referring Provider   Date  09/06/17    Referring Provider  Ena Dawley, MD.      Recumbant Bike   Level  3    Minutes  10    METs  2      NuStep   Level  3    SPM  85    Minutes  10    METs  2      Arm Ergometer   Level  3    RPM  30    Minutes  10      Prescription Details   Frequency (times per week)  3    Duration  Progress to 30 minutes of continuous aerobic without signs/symptoms of physical distress      Intensity   THRR 40-80% of Max Heartrate  60-120    Ratings of Perceived Exertion  11-13    Perceived Dyspnea  --      Progression   Progression  Continue to progress workloads to maintain intensity without signs/symptoms of physical distress.      Resistance Training   Training Prescription  Yes    Weight  3lbs.    Reps  10-15       Discharge Exercise Prescription (Final Exercise Prescription Changes): Exercise Prescription Changes - 11/28/17 0951      Response to Exercise   Blood Pressure (Admit)  122/68    Blood Pressure (Exercise)  136/82    Blood Pressure (Exit)  104/72    Heart Rate (Admit)   70 bpm    Heart Rate (Exercise)  94 bpm    Heart Rate (Exit)  71 bpm    Rating of Perceived Exertion (Exercise)  12    Symptoms  none    Duration  Continue with 45 min of aerobic exercise without signs/symptoms of physical distress.    Intensity  THRR unchanged      Progression   Progression  Continue to progress workloads to maintain intensity without signs/symptoms of physical distress.    Average METs  2.9      Resistance Training   Training Prescription  Yes    Weight  3lbs.    Reps  10-15    Time  10 Minutes      Interval Training   Interval Training  No      Recumbant Bike   Level  5    Minutes  10    METs  3.1      NuStep   Level  6    SPM  85    Minutes  10  METs  3.6      Arm Ergometer   Level  5 16 watts, 1.96 METs    RPM  14    Minutes  10      Home Exercise Plan   Plans to continue exercise at  [facility] (comment)    Frequency  Add 2 additional days to program exercise sessions.    Initial Home Exercises Provided  09/16/17       Functional Capacity: 6 Minute Walk    Row Name 09/06/17 1146 12/05/17 1008       6 Minute Walk   Phase  Initial  Discharge    Distance  839 feet  1224 feet    Distance % Change  -  45.89 %    Distance Feet Change  -  385 ft    Walk Time  6 minutes  6 minutes    # of Rest Breaks  0  0    MPH  1.59  2.32    METS  1.87  2.3    RPE  13  13    VO2 Peak  6.54  8.05    Symptoms  Yes (comment)  Yes (comment)    Comments  Patient c/o knee pain, 6/10 on the pain scale.  Patient c/o left knee pain, 7/10 on the pain scale.    Resting HR  72 bpm  74 bpm    Resting BP  120/80  124/72    Resting Oxygen Saturation   98 %  -    Exercise Oxygen Saturation  during 6 min walk  99 %  -    Max Ex. HR  89 bpm  89 bpm    Max Ex. BP  160/78  126/72    2 Minute Post BP  120/80  104/72       Psychological, QOL, Others - Outcomes: PHQ 2/9: Depression screen [facility] 2/9 12/09/2017 09/27/2017 08/16/2017 07/30/2017 07/11/2017   Decreased Interest 0 0 0 0 0  Down, Depressed, Hopeless 0 0 0 0 0  PHQ - 2 Score 0 0 0 0 0    Quality of Life: Quality of Life - 09/12/17 1406      Quality of Life Scores   Health/Function Pre  23.53 %    Socioeconomic Pre  25.33 %    Psych/Spiritual Pre  28.29 %    Family Pre  28.8 %    GLOBAL Pre  25.67 %       Personal Goals: Goals established at orientation with interventions provided to work toward goal. Personal Goals and Risk Factors at Admission - 09/06/17 1141      Core Components/Risk Factors/Patient Goals on Admission    Weight Management  Yes;Obesity    Intervention  Weight Management/Obesity: Establish reasonable short term and long term weight goals.;Obesity: Provide education and appropriate resources to help participant work on and attain dietary goals.    Admit Weight  181 lb 7 oz (82.3 kg)    Goal Weight: Short Term  175 lb (79.4 kg)    Goal Weight: Long Term  160 lb (72.6 kg)    Expected Outcomes  Short Term: Continue to assess and modify interventions until short term weight is achieved;Long Term: Adherence to nutrition and physical activity/exercise program aimed toward attainment of established weight goal;Weight Loss: Understanding of general recommendations for a balanced deficit meal plan, which promotes 1-2 lb weight loss per week and includes a negative energy balance of [id number] kcal/d  Diabetes  Yes    Intervention  Provide education about signs/symptoms and action to take for hypo/hyperglycemia.;Provide education about proper nutrition, including hydration, and aerobic/resistive exercise prescription along with prescribed medications to achieve blood glucose in normal ranges: Fasting glucose 65-99 mg/dL    Expected Outcomes  Short Term: Participant verbalizes understanding of the signs/symptoms and immediate care of hyper/hypoglycemia, proper foot care and importance of medication, aerobic/resistive exercise and nutrition plan for blood glucose  control.;Long Term: Attainment of HbA1C < 7%.    Hypertension  Yes    Intervention  Provide education on lifestyle modifcations including regular physical activity/exercise, weight management, moderate sodium restriction and increased consumption of fresh fruit, vegetables, and low fat dairy, alcohol moderation, and smoking cessation.;Monitor prescription use compliance.    Expected Outcomes  Short Term: Continued assessment and intervention until BP is < 140/58m HG in hypertensive participants. < 130/844mHG in hypertensive participants with diabetes, heart failure or chronic kidney disease.;Long Term: Maintenance of blood pressure at goal levels.    Lipids  Yes    Intervention  Provide education and support for participant on nutrition & aerobic/resistive exercise along with prescribed medications to achieve LDL <7025mHDL >33m2m  Expected Outcomes  Short Term: Participant states understanding of desired cholesterol values and is compliant with medications prescribed. Participant is following exercise prescription and nutrition guidelines.;Long Term: Cholesterol controlled with medications as prescribed, with individualized exercise RX and with personalized nutrition plan. Value goals: LDL < 70mg102mL > 40 mg.        Personal Goals Discharge: Goals and Risk Factor Review    Row Name 10/13/17 1021 11/08/17 1507 12/15/17 1336         Core Components/Risk Factors/Patient Goals Review   Personal Goals Review  Weight Management/Obesity;Lipids;Hypertension;Diabetes  Weight Management/Obesity;Lipids;Hypertension;Diabetes  Weight Management/Obesity;Lipids;Hypertension;Diabetes     Review  Amit's vital signs and CBG's have been stable at cardiac rehab  Spyridon's vital signs and CBG's have been stable at cardiac rehab  Ustin's vital signs and CBG's have been stable at cardiac rehab     Expected Outcomes  HarolYoshua continue to take his medicaitons as presribed and exercise at phase 2 cardiac rehab   HarolBuel continue to take his medicaitons as presribed and exercise at phase 2 cardiac rehab  HarolRally continue to take his medicaitons as presribed and exercise at the [facility] discharge from cardiac rehab        Exercise Goals and Review: Exercise Goals    Row Name 09/06/17 1152             Exercise Goals   Increase Physical Activity  Yes       Intervention  Provide advice, education, support and counseling about physical activity/exercise needs.;Develop an individualized exercise prescription for aerobic and resistive training based on initial evaluation findings, risk stratification, comorbidities and participant's personal goals.       Expected Outcomes  Long Term: Exercising regularly at least 3-5 days a week.;Long Term: Add in home exercise to make exercise part of routine and to increase amount of physical activity.       Increase Strength and Stamina  Yes       Intervention  Provide advice, education, support and counseling about physical activity/exercise needs.;Develop an individualized exercise prescription for aerobic and resistive training based on initial evaluation findings, risk stratification, comorbidities and participant's personal goals.       Expected Outcomes  Short Term: Increase workloads from initial exercise prescription for resistance,  speed, and METs.;Short Term: Perform resistance training exercises routinely during rehab and add in resistance training at home;Long Term: Improve cardiorespiratory fitness, muscular endurance and strength as measured by increased METs and functional capacity (6MWT)       Able to understand and use rate of perceived exertion (RPE) scale  Yes       Intervention  Provide education and explanation on how to use RPE scale       Expected Outcomes  Short Term: Able to use RPE daily in rehab to express subjective intensity level;Long Term:  Able to use RPE to guide intensity level when exercising independently       Knowledge and  understanding of Target Heart Rate Range (THRR)  Yes       Intervention  Provide education and explanation of THRR including how the numbers were predicted and where they are located for reference       Expected Outcomes  Short Term: Able to state/look up THRR;Long Term: Able to use THRR to govern intensity when exercising independently;Short Term: Able to use daily as guideline for intensity in rehab       Able to check pulse independently  Yes       Intervention  Provide education and demonstration on how to check pulse in carotid and radial arteries.;Review the importance of being able to check your own pulse for safety during independent exercise       Expected Outcomes  Short Term: Able to explain why pulse checking is important during independent exercise;Long Term: Able to check pulse independently and accurately       Understanding of Exercise Prescription  Yes       Intervention  Provide education, explanation, and written materials on patient's individual exercise prescription       Expected Outcomes  Short Term: Able to explain program exercise prescription;Long Term: Able to explain home exercise prescription to exercise independently          Nutrition & Weight - Outcomes: Pre Biometrics - 09/06/17 1153      Pre Biometrics   Height  5' 3"  (1.6 m)    Weight  181 lb 7 oz (82.3 kg)    Waist Circumference  39.75 inches    Hip Circumference  40.25 inches    Waist to Hip Ratio  0.99 %    BMI (Calculated)  32.15    Triceps Skinfold  15 mm    % Body Fat  29.8 %    Grip Strength  41.5 kg    Flexibility  14 in    Single Leg Stand  30 seconds      Post Biometrics - 12/05/17 1014       Post  Biometrics   Waist Circumference  38 inches    Hip Circumference  38.5 inches    Waist to Hip Ratio  0.99 %    Triceps Skinfold  14 mm    Grip Strength  43 kg    Flexibility  15.25 in    Single Leg Stand  30 seconds       Nutrition: Nutrition Therapy & Goals - 09/28/17 1135       Nutrition Therapy   Diet  Carb Modified, Heart Healthy      Personal Nutrition Goals   Nutrition Goal  Pt to identify food quantities necessary to achieve weight loss of 6-20lb at graduation from cardiac rehab. Goal wt of 160 lb desired.     Personal Goal #2  Improved blood  glucose control as evidenced by pt's A1c trending from 8.2 to less than 7.0.    Personal Goal #3  Pt able to name foods that affect blood glucose       Intervention Plan   Intervention  Prescribe, educate and counsel regarding individualized specific dietary modifications aiming towards targeted core components such as weight, hypertension, lipid management, diabetes, heart failure and other comorbidities.    Expected Outcomes  Short Term Goal: Understand basic principles of dietary content, such as calories, fat, sodium, cholesterol and nutrients.;Long Term Goal: Adherence to prescribed nutrition plan.       Nutrition Discharge: Nutrition Assessments - 09/28/17 1147      MEDFICTS Scores   Pre Score  81       Education Questionnaire Score: Knowledge Questionnaire Score - 09/12/17 1406      Knowledge Questionnaire Score   Pre Score  20/24       Goals reviewed with patient; copy given to patient.Epimenio graduated from cardiac rehab program today with completion of 36 exercise sessions in Phase II on Friday. Pt maintained good attendance and progressed nicely during his participation in rehab as evidenced by increased MET level.   Medication list reconciled. Repeat  PHQ score- 0 .  Pt has made significant lifestyle changes and should be commended for his success. Pt feels he has achieved his goals during cardiac rehab. Han maintained his current weight and increased his distance on his post exercise walk test.  Pt plans to continue exercise at the [facility]. We are proud of Kemonte's progress.Barnet Pall, RN,BSN 12/15/2017 1:44 PM

## 2017-12-09 ENCOUNTER — Encounter (HOSPITAL_COMMUNITY)
Admission: RE | Admit: 2017-12-09 | Discharge: 2017-12-09 | Disposition: A | Discharge status: Home / Self Care | Payer: Medicare Other | Source: Ambulatory Visit | Attending: Cardiology | Admitting: Cardiology

## 2017-12-09 VITALS — BP 136/82 | HR 72 | Ht 63.0 in | Wt 183.4 lb

## 2017-12-09 DIAGNOSIS — Z951 Presence of aortocoronary bypass graft: Secondary | ICD-10-CM | POA: Diagnosis not present

## 2017-12-09 DIAGNOSIS — Z7982 Long term (current) use of aspirin: Secondary | ICD-10-CM | POA: Diagnosis not present

## 2017-12-09 DIAGNOSIS — Z7984 Long term (current) use of oral hypoglycemic drugs: Secondary | ICD-10-CM | POA: Diagnosis not present

## 2017-12-09 DIAGNOSIS — Z79899 Other long term (current) drug therapy: Secondary | ICD-10-CM | POA: Diagnosis not present

## 2017-12-17 ENCOUNTER — Telehealth: Payer: Self-pay | Admitting: Family Medicine

## 2017-12-17 NOTE — Telephone Encounter (Signed)
Pt. Calling to request refill on his gout medication, colchicine /6 mg tablets. Pt is on vaction and would like it sent to [facility]. Will come back into town on 12/19/17, if the script is not refilled by then, pt requests the script be sent to his normal pharmacy on [address].

## 2017-12-21 ENCOUNTER — Telehealth: Payer: Self-pay | Admitting: *Deleted

## 2017-12-21 ENCOUNTER — Other Ambulatory Visit: Payer: Self-pay | Admitting: Family Medicine

## 2017-12-21 NOTE — Telephone Encounter (Signed)
colchine has never been prescribed by Dr. Carlota Raspberry, if you are thinking you have a gout flare up you would need to come in and be seen.

## 2017-12-21 NOTE — Telephone Encounter (Signed)
Patient needs office visit to refill medication as it has been 5 years since it was last prescribed for him. Please call patient to schedule.

## 2017-12-26 NOTE — Telephone Encounter (Signed)
mychart message sent to pt about making an apt °

## 2017-12-27 ENCOUNTER — Encounter (HOSPITAL_COMMUNITY): Payer: Self-pay | Admitting: *Deleted

## 2017-12-27 DIAGNOSIS — Z951 Presence of aortocoronary bypass graft: Secondary | ICD-10-CM

## 2017-12-27 NOTE — Addendum Note (Signed)
Encounter addended by: Sol Passer on: 12/27/2017 11:21 AM  Actions taken: Flowsheet accepted, Flowsheet data copied forward, Visit Navigator Flowsheet section accepted, Vitals modified

## 2017-12-27 NOTE — Progress Notes (Signed)
Discharge Progress Report  Patient Details  Name: Antonio Wells MRN: [id number] Date of Birth: 1946-02-10 Referring Provider:     CARDIAC REHAB PHASE II ORIENTATION from 09/06/2017 in [place]  Referring Provider  Ena Dawley, MD.       Number of Visits: 35  Reason for Discharge:  Patient independent in their exercise.  Smoking History:  Social History   Tobacco Use  Smoking Status Former Smoker  . Packs/day: 2.00  . Years: 15.00  . Pack years: 30.00  . Types: Cigarettes  . Last attempt to quit: 07/26/1998  . Years since quitting: 19.4  Smokeless Tobacco Never Used    Diagnosis:  S/P CABG (coronary artery bypass graft)  ADL UCSD:   Initial Exercise Prescription:   Discharge Exercise Prescription (Final Exercise Prescription Changes): Exercise Prescription Changes - 12/09/17 0949      Response to Exercise   Blood Pressure (Admit)  136/82    Blood Pressure (Exercise)  138/72    Blood Pressure (Exit)  122/70    Heart Rate (Admit)  72 bpm    Heart Rate (Exercise)  98 bpm    Heart Rate (Exit)  72 bpm    Rating of Perceived Exertion (Exercise)  12    Symptoms  none    Duration  Continue with 45 min of aerobic exercise without signs/symptoms of physical distress.    Intensity  THRR unchanged      Progression   Progression  Continue to progress workloads to maintain intensity without signs/symptoms of physical distress.    Average METs  3.1      Resistance Training   Training Prescription  Yes    Weight  5lbs    Reps  10-15    Time  10 Minutes      Interval Training   Interval Training  No      Recumbant Bike   Level  6    Minutes  10    METs  3.2      NuStep   Level  7    SPM  85    Minutes  10    METs  4      Arm Ergometer   Level  6 18 watts, 2.12 METs    RPM  18    Minutes  10      Home Exercise Plan   Plans to continue exercise at  [facility] (comment)    Frequency  Add 2 additional days  to program exercise sessions.    Initial Home Exercises Provided  09/16/17       Functional Capacity: 6 Minute Walk    Row Name 12/05/17 1008         6 Minute Walk   Phase  Discharge     Distance  1224 feet     Distance % Change  45.89 %     Distance Feet Change  385 ft     Walk Time  6 minutes     # of Rest Breaks  0     MPH  2.32     METS  2.3     RPE  13     VO2 Peak  8.05     Symptoms  Yes (comment)     Comments  Patient c/o left knee pain, 7/10 on the pain scale.     Resting HR  74 bpm     Resting BP  124/72     Max Ex.  HR  89 bpm     Max Ex. BP  126/72     2 Minute Post BP  104/72        Psychological, QOL, Others - Outcomes: PHQ 2/9: Depression screen [facility] 2/9 12/09/2017 09/27/2017 08/16/2017 07/30/2017 07/11/2017  Decreased Interest 0 0 0 0 0  Down, Depressed, Hopeless 0 0 0 0 0  PHQ - 2 Score 0 0 0 0 0    Quality of Life: Quality of Life - 12/09/17 0949      Quality of Life Scores   Health/Function Pre  23.53 %    Health/Function Post  24.7 %    Health/Function % Change  4.97 %    Socioeconomic Pre  25.33 %    Socioeconomic Post  25.93 %    Socioeconomic % Change   2.37 %    Psych/Spiritual Pre  28.29 %    Psych/Spiritual Post  29 %    Psych/Spiritual % Change  2.51 %    Family Pre  28.8 %    Family Post  27.6 %    Family % Change  -4.17 %    GLOBAL Pre  25.67 %    GLOBAL Post  26.18 %    GLOBAL % Change  1.99 %       Personal Goals: Goals established at orientation with interventions provided to work toward goal.    Personal Goals Discharge: Goals and Risk Factor Review    Row Name 11/08/17 1507 12/15/17 1336           Core Components/Risk Factors/Patient Goals Review   Personal Goals Review  Weight Management/Obesity;Lipids;Hypertension;Diabetes  Weight Management/Obesity;Lipids;Hypertension;Diabetes      Review  Carnel's vital signs and CBG's have been stable at cardiac rehab  Rocco's vital signs and CBG's have been stable at cardiac  rehab      Expected Outcomes  Lomax will continue to take his medicaitons as presribed and exercise at phase 2 cardiac rehab  Bilbo will continue to take his medicaitons as presribed and exercise at the [facility] upon discharge from cardiac rehab         Exercise Goals and Review:   Nutrition & Weight - Outcomes:  Post Biometrics - 12/09/17 0949       Post  Biometrics   Height  5\' 3"  (1.6 m)    Weight  183 lb 6.8 oz (83.2 kg)    Waist Circumference  38 inches    Hip Circumference  38.5 inches    Waist to Hip Ratio  0.99 %    BMI (Calculated)  32.5    Triceps Skinfold  14 mm    % Body Fat  28.8 %    Grip Strength  43 kg    Flexibility  15.25 in    Single Leg Stand  30 seconds       Nutrition:   Nutrition Discharge:   Education Questionnaire Score: Knowledge Questionnaire Score - 12/09/17 0949      Knowledge Questionnaire Score   Pre Score  20/24    Post Score  22/24       Goals reviewed with patient; copy given to patient. Updated information see 12/07/17 for discharge specifics.Barnet Pall, RN,BSN 12/27/2017 2:05 PM

## 2017-12-29 ENCOUNTER — Encounter: Payer: Self-pay | Admitting: Cardiology

## 2017-12-29 ENCOUNTER — Ambulatory Visit (INDEPENDENT_AMBULATORY_CARE_PROVIDER_SITE_OTHER): Payer: Medicare Other | Admitting: Cardiology

## 2017-12-29 VITALS — BP 136/72 | HR 72 | Ht 63.0 in | Wt 184.6 lb

## 2017-12-29 DIAGNOSIS — I2510 Atherosclerotic heart disease of native coronary artery without angina pectoris: Secondary | ICD-10-CM | POA: Diagnosis not present

## 2017-12-29 DIAGNOSIS — Z951 Presence of aortocoronary bypass graft: Secondary | ICD-10-CM | POA: Diagnosis not present

## 2017-12-29 DIAGNOSIS — E785 Hyperlipidemia, unspecified: Secondary | ICD-10-CM | POA: Diagnosis not present

## 2017-12-29 DIAGNOSIS — I480 Paroxysmal atrial fibrillation: Secondary | ICD-10-CM | POA: Diagnosis not present

## 2017-12-29 DIAGNOSIS — I10 Essential (primary) hypertension: Secondary | ICD-10-CM | POA: Diagnosis not present

## 2017-12-29 DIAGNOSIS — I350 Nonrheumatic aortic (valve) stenosis: Secondary | ICD-10-CM | POA: Diagnosis not present

## 2017-12-29 DIAGNOSIS — I5032 Chronic diastolic (congestive) heart failure: Secondary | ICD-10-CM | POA: Diagnosis not present

## 2017-12-29 MED ORDER — METOPROLOL TARTRATE 50 MG PO TABS: 50.0000 mg | ORAL_TABLET | Freq: Two times a day (BID) | ORAL | 1 refills | Status: DC

## 2017-12-29 MED ORDER — AMLODIPINE BESYLATE 10 MG PO TABS: 10.0000 mg | ORAL_TABLET | Freq: Every day | ORAL | 1 refills | Status: DC

## 2017-12-29 MED ORDER — FUROSEMIDE 20 MG PO TABS: 20.0000 mg | ORAL_TABLET | Freq: Every day | ORAL | 6 refills | Status: DC | PRN

## 2017-12-29 MED ORDER — ATORVASTATIN CALCIUM 80 MG PO TABS: 80.0000 mg | ORAL_TABLET | Freq: Every day | ORAL | 1 refills | Status: DC

## 2017-12-29 NOTE — Patient Instructions (Signed)
Medication Instructions:   START TAKING LASIX 20 MG BY MOUTH DAILY ONLY AS NEEDED FOR LOWER EXTREMITY SWELLING AND FLUID RETENTION    Labwork:  PRIOR TO YOUR 6 MONTH FOLLOW-UP APPOINTMENT WITH DR NELSON TO CHECK--CMET, CBC W DIFF, TSH, AND LIPIDS--PLEASE COME FASTING TO THIS LAB APPOINTMENT     Testing/Procedures:  Your physician has requested that you have an echocardiogram. Echocardiography is a painless test that uses sound waves to create images of your heart. It provides your doctor with information about the size and shape of your heart and how well your heart's chambers and valves are working. This procedure takes approximately one hour. There are no restrictions for this procedure.  PLEASE SCHEDULE THIS A COUPLE DAYS PRIOR TO YOUR 6 MONTH FOLLOW-UP APPOINTMENT WITH DR Meda Coffee, ALONG WITH YOUR LAB SAME DAY    Follow-Up:  Your physician wants you to follow-up in: [facility] will receive a reminder letter in the mail two months in advance. If you don't receive a letter, please call our office to schedule the follow-up appointment.  PLEASE HAVE YOUR ECHO AND LAB DONE A WEEK PRIOR TO THIS OFFICE VISIT.        If you need a refill on your cardiac medications before your next appointment, please call your pharmacy.

## 2017-12-29 NOTE — Progress Notes (Signed)
Cardiology Office Note    Date:  12/29/2017  ID:  Antonio Wells, DOB Mar 08, 1946, MRN [id number] PCP:  [name], MD  Cardiologist:  Dr. Meda Coffee   Chief Complaint: f/u CABG  History of Present Illness:  Antonio Wells is a 73 y.o. male with history of CAD with unstable angina s/p recent CABGx3 in 05/2017 with post-op atrial fib, HTN, HLD, DM, mild AS/MR, CKD stage III, sleep apnea who presents for post-op follow-up. He was evaluated for unstable angina in October 2018 with abnormal cardiac CT. Cardiac cath showed obstructive 2V disease. Pre-op echo showed mild LVH, EF 60-65%, grade 1 DD, mild AS/MR. He returned for outpatient CABG and underwent CABGx3 with LIMA-LAD and sequential SVG to OM1/OM3. He did have post-op atrial fib treated with amiodarone. At post-op follow-up with primary care he reported persistent nausea so amiodarone was stopped after discussion with our office. He was unfortunately subsequently admitted 12/17-12/20 with persistent failure to thrive, weight loss, thrush and dehydration. He had evidence of AKI and was treated with IV fluids for hypernatremia. 30-day event monitor was recommended given d/c of amiodarone which was negative for recurrent atrial fib. F/u labs by primary care 07/30/17 showed Hgb 12.4, K 4.7, Cr 1.38 (back near baseline). Otherwise recent LFTs were normal and LDL was 81 in 04/2017.  He returns for follow-up overall doing much better. He has gotten back into the gym with gradual increase of activity 3x a week and is not having any angina or dyspnea. He is somewhat limited by his knees. He is planning on starting cardiac rehab in the next week. He admits he's not been wearing his CPAP recently as he does not feel like the mask fits right - can only tolerate about 2 hours before he has to take it off. No further nausea. No palpitations or syncope. He has had intermittent mild edema moreso in the leg with the vein graft harvests. He feels this appears at  baseline/improved today.  12/29/2017 -the patient is coming after 4 months, he has completed cardiac rehab and enjoyed it.  He denies any chest pain or shortness of breath no palpitation dizziness or syncope.  He has mild lower extremity edema.  He is on no diuretics.  He is tolerating his medications well including Lipitor.  He has mild tenderness in his sternotomy scar.   Past Medical History:  Diagnosis Date  . Arthritis    "knees" (07/11/2017)  . CKD (chronic kidney disease), stage III ([place])   . Coronary artery disease    a.  CABGx3 in 05/2017.  Marland Kitchen Gout   . Heart murmur   . High cholesterol   . Hypertension   . Mild aortic stenosis   . Mild mitral regurgitation   . OSA on CPAP   . Postoperative atrial fibrillation ([place]) 06/2017  . Type II diabetes mellitus ([place])     Past Surgical History:  Procedure Laterality Date  . CARDIAC CATHETERIZATION    . COLONOSCOPY  2008  . CORONARY ARTERY BYPASS GRAFT N/A 06/24/2017   Procedure: CORONARY ARTERY BYPASS GRAFTING (CABG) x 3 ; Using Left Internal Mammary Artery and Right Great Saphenous Leg Vein Harvested Endoscopically;  Surgeon: Gaye Pollack, MD;  Location: [facility] OR;  Service: Open Heart Surgery;  Laterality: N/A;  . EYE SURGERY    . LEFT HEART CATH AND CORONARY ANGIOGRAPHY N/A 05/25/2017   Procedure: LEFT HEART CATH AND CORONARY ANGIOGRAPHY;  Surgeon: Wellington Hampshire, MD;  Location: [facility];  Service: Cardiovascular;  Laterality: N/A;  . TEE WITHOUT CARDIOVERSION N/A 06/24/2017   Procedure: TRANSESOPHAGEAL ECHOCARDIOGRAM (TEE);  Surgeon: Gaye Pollack, MD;  Location: [facility];  Service: Open Heart Surgery;  Laterality: N/A;    Current Medications: Current Meds  Medication Sig  . amLODipine (NORVASC) 10 MG tablet Take 1 tablet (10 mg total) by mouth daily.  Marland Kitchen aspirin EC 81 MG tablet Take 1 tablet (81 mg total) by mouth daily.  Marland Kitchen atorvastatin (LIPITOR) 80 MG tablet Take 1 tablet (80 mg total) by mouth daily at 6 PM.  .  colchicine 0.6 MG tablet Take 1-2 tablets (0.6-1.2 mg total) by mouth daily as needed (for gout flare). 2 po at onset of flair and then repeat in 1 h for acute gout attack then 1 po qd until pain free  . metFORMIN (GLUCOPHAGE) 500 MG tablet Take 500 mg by mouth 2 (two) times daily with a meal.  . metoprolol tartrate (LOPRESSOR) 50 MG tablet Take 1 tablet (50 mg total) by mouth 2 (two) times daily.  Allergies:   Penicillins; Lisinopril; Amiodarone; Oxycodone; Zofran [ondansetron hcl]; Cortisone; and Tramadol   Social History   Socioeconomic History  . Marital status: Married    Spouse name: [name]  . Number of children: 3  . Years of education: 23  . Highest education level: Not on file  Occupational History  . Occupation: Musician  Social Needs  . Financial resource strain: Not on file  . Food insecurity:    Worry: Not on file    Inability: Not on file  . Transportation needs:    Medical: Not on file    Non-medical: Not on file  Tobacco Use  . Smoking status: Former Smoker    Packs/day: 2.00    Years: 15.00    Pack years: 30.00    Types: Cigarettes    Last attempt to quit: 07/26/1998    Years since quitting: 19.4  . Smokeless tobacco: Never Used  Substance and Sexual Activity  . Alcohol use: No    Alcohol/week: 0.0 oz  . Drug use: No  . Sexual activity: Yes  Lifestyle  . Physical activity:    Days per week: Not on file    Minutes per session: Not on file  . Stress: Not on file  Relationships  . Social connections:    Talks on phone: Not on file    Gets together: Not on file    Attends religious service: Not on file    Active member of club or organization: Not on file    Attends meetings of clubs or organizations: Not on file    Relationship status: Not on file  Other Topics Concern  . Not on file  Social History Narrative   Patient is married [name]) and lives at home with his wife.   Patient has three adult children.   Patient is retired.   Patient is a Gaffer.   Patient is right-handed.   Patient drinks one cup of coffee daily and 2-3 sodas per day.        Family History:  Family History  Problem Relation Age of Onset  . Hypertension Mother   . Kidney disease Mother   . Diabetes Mother   . Hypertension Father   . Cancer Brother        lung  . Stroke Brother   . Colon cancer Neg Hx      ROS:   Please see the history  of present illness.  Does note some sensitivity at sternal scar but no dehiscence erythema or suppuration All other systems are reviewed and otherwise negative.    PHYSICAL EXAM:   VS:  BP 136/72   Pulse 72   Ht 5\' 3"  (1.6 m)   Wt 184 lb 9.6 oz (83.7 kg)   SpO2 97%   BMI 32.70 kg/m   BMI: Body mass index is 32.7 kg/m. GEN: Well nourished, well developed AAM in no acute distress  HEENT: normocephalic, atraumatic Neck: no JVD, carotid bruits, or masses Cardiac: RRR; very soft SEM RUSB, no rubs or gallops, mild B/L LE edema  Respiratory:  clear to auscultation bilaterally, normal work of breathing GI: soft, nontender, nondistended, + BS MS: no deformity or atrophy  Skin: warm and dry, no rash. Well healing sternal scar and endoscopic sites without dehiscence, suppuration or erythema Neuro:  Alert and Oriented x 3, Strength and sensation are intact, follows commands Psych: euthymic mood, full affect  Wt Readings from Last 3 Encounters:  12/29/17 184 lb 9.6 oz (83.7 kg)  12/09/17 183 lb 6.8 oz (83.2 kg)  09/27/17 182 lb 6.4 oz (82.7 kg)    Studies/Labs Reviewed:   EKG:  EKG was ordered today and personally reviewed by me and demonstrates NSR 81bpm, left atrial enlargement, nonspecific TW changes I, avL, left axis deviation  Recent Labs: 04/27/2017: NT-Pro BNP 46 06/25/2017: TSH 0.614 07/12/2017: Magnesium 2.3 07/30/2017: Hemoglobin 12.4; Platelets 271 09/27/2017: ALT 18; BUN 16; Creatinine, Ser 1.38; Potassium 4.2; Sodium 144   Lipid Panel    Component Value Date/Time   CHOL 159 05/12/2017 1128   TRIG  166 (H) 05/12/2017 1128   HDL 45 05/12/2017 1128   CHOLHDL 3.5 05/12/2017 1128   CHOLHDL 2.8 02/24/2016 0749   VLDL 24 02/24/2016 0749   LDLCALC 81 05/12/2017 1128   Additional studies/ records that were reviewed today include: Summarized above.   ASSESSMENT & PLAN:   1. CAD - doing well post-operatively.  He is asymptomatic, will continue aspirin and atorvastatin as well as metoprolol.  He is considering to have his left knee replaced, there should be no contraindication from cardiac standpoint.. 2. Paroxysmal atrial fib - did not tolerate amiodarone as above. Fortunately f/u event monitor was negative for recurrent PAF.  He has had no recurrent palpitations.   3. Acute on chronic diastolic CHF, start Lasix 20 mg p.o. daily as needed. 4. Essential HTN -blood pressure is controlled. 5. Hyperlipidemia -lipids are at goal, Lipitor is well-tolerated. 6. Mild aortic stenosis and mitral regurgitation - asymptomatic; follow clinically.  We will repeat echocardiogram prior to his next visit in 6 months.  Disposition: F/u with Dr. Meda Coffee in 6 months, check labs and echocardiogram prior to that appointment..  Medication Adjustments/Labs and Tests Ordered: Current medicines are reviewed at length with the patient today.  Concerns regarding medicines are outlined above. Medication changes, Labs and Tests ordered today are summarized above and listed in the Patient Instructions accessible in Encounters.   Signed, Ena Dawley, MD  12/29/2017 10:03 AM    [address], [place], [place]  [address] Phone: [phone]; Fax: [phone]

## 2017-12-30 ENCOUNTER — Ambulatory Visit (INDEPENDENT_AMBULATORY_CARE_PROVIDER_SITE_OTHER): Payer: Medicare Other | Admitting: Family Medicine

## 2017-12-30 ENCOUNTER — Encounter: Payer: Self-pay | Admitting: Family Medicine

## 2017-12-30 ENCOUNTER — Other Ambulatory Visit: Payer: Self-pay

## 2017-12-30 VITALS — BP 122/81 | HR 71 | Temp 98.0°F | Resp 16 | Ht 63.0 in | Wt 183.0 lb

## 2017-12-30 DIAGNOSIS — E1122 Type 2 diabetes mellitus with diabetic chronic kidney disease: Secondary | ICD-10-CM | POA: Diagnosis not present

## 2017-12-30 DIAGNOSIS — I2510 Atherosclerotic heart disease of native coronary artery without angina pectoris: Secondary | ICD-10-CM

## 2017-12-30 DIAGNOSIS — M109 Gout, unspecified: Secondary | ICD-10-CM | POA: Diagnosis not present

## 2017-12-30 MED ORDER — METFORMIN HCL 500 MG PO TABS: 500.0000 mg | ORAL_TABLET | Freq: Two times a day (BID) | ORAL | 1 refills | Status: DC

## 2017-12-30 MED ORDER — COLCHICINE 0.6 MG PO TABS: 0.6000 mg | ORAL_TABLET | Freq: Every day | ORAL | 0 refills | Status: DC | PRN

## 2017-12-30 NOTE — Progress Notes (Signed)
Subjective:  By signing my name below, I, Antonio Wells, attest that this documentation has been prepared under the direction and in the presence of Antonio Ray, MD. Electronically Signed: [name], [place]. 12/30/2017 , 3:22 PM .  Patient was seen in Room 2 .   Patient ID: Antonio Wells, male    DOB: 1945-10-27, 72 y.o.   MRN: [id number] Chief Complaint  Patient presents with  . Chronic Conditions    3 month follow-up   . Medication Refills   HPI Antonio Wells is a 72 y.o. male Here for follow up and medication refills.   Diabetes with CKD Lab Results  Component Value Date   HGBA1C 7.0 (H) 09/27/2017   Wt Readings from Last 3 Encounters:  12/30/17 183 lb (83 kg)  12/29/17 184 lb 9.6 oz (83.7 kg)  12/09/17 183 lb 6.8 oz (83.2 kg)   Lab Results  Component Value Date   CREATININE 1.38 (H) 09/27/2017   See previous notes. He did have some improvement from previous creatinine with elevated glucose when seen in January. Metformin was restarted at 500 mg bid. He had persistent elevated readings when discussed in march. He was trying to make some dietary changes at that time. Importance of improved control was discussed, with CAD, CABG in Nov 2014. Had been intolerant to higher doses of metformin. Based on improved A1C, continued same regime with close home monitoring of readings. Creatinine stable at 1.38 from previous reading of same.   He has been taking metformin 500 mg bid; denies any side effects. He checks his Wells sugar often while he was going to cardiac rehab; home Wells sugar readings ranging from 120s-150s, no recent 200s; highest 195 yesterday morning.   Lab Results  Component Value Date   MICROALBUR 39.6 08/25/2015   Optho: 05/03/17  Foot: 02/03/17 Pneumovax: postponed previously; will defer today. When he received pneumovax previously with flu shot, he felt sick afterwards.   OSA on CPAP He is followed by Dr. Brett Fairy. He's still using his CPAP  every night.   Gout Recent request of refill of colchicine. He doesn't take it often. He had a recent in May, in his left ankle for about 2 days. Prior to this flare, last flare was over year ago.   Health maintenance Recommended scheduling colonoscopy at last visit, reportedly last done in Jan 2009. He hasn't made an appointment yet. His brother had colon cancer. He denies Wells in stool, dark tarry stool, or unexplained weight loss.   Cardiac History of CAD, CABG and HTN, post op afib. Seen by cardiologist yesterday, completed cardiac rehab. Mild lower extremity edema. He was continued on aspirin and Lipitor. He did have an event monitor for afib without any recurrence and was continued on metoprolol; did not tolerate amiodarone with acute diastolic CHF, started on Lasix 20 mg qd prn. Plan for repeat echocardiogram in 6 months for mild aortic stenosis, and mitral regurgitation. He's also on amlodipine 10 mg and metoprolol 50 mg bid for BP. He denies afib recently.   Hyperlipidemia Lab Results  Component Value Date   CHOL 159 05/12/2017   HDL 45 05/12/2017   LDLCALC 81 05/12/2017   TRIG 166 (H) 05/12/2017   CHOLHDL 3.5 05/12/2017   Lab Results  Component Value Date   ALT 18 09/27/2017   AST 15 09/27/2017   ALKPHOS 84 09/27/2017   BILITOT 0.4 09/27/2017   He takes Lipitor 80 mg qd.    Patient Active Problem List  Diagnosis Date Noted  . Malnutrition of moderate degree 07/14/2017  . Thrush 07/11/2017  . FTT (failure to thrive) in adult 07/11/2017  . 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 CAD (coronary artery disease) 05/25/2017  . Unstable angina ([place])   . Hypertensive heart disease 07/14/2015  . Lower extremity edema 07/14/2015  . Aortic stenosis 01/09/2015  . OSA on CPAP 12/12/2014  . Hypersomnia with sleep apnea 12/12/2014  . Obstructive sleep apnea (adult) (pediatric) 02/04/2014  . 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 Obesity 02/04/2014  . Mitral regurgitation and aortic  stenosis 01/02/2014  . DOE (dyspnea on exertion) 11/26/2013  . Gout 08/12/2011  . Hypertension 08/12/2011  . High cholesterol 08/12/2011  . OSA (obstructive sleep apnea) 08/12/2011  . Diabetes type 2, uncontrolled ([place]) 08/12/2011   Past Medical History:  Diagnosis Date  . Arthritis    "knees" (07/11/2017)  . CKD (chronic kidney disease), stage III ([place])   . Coronary artery disease    a.  CABGx3 in 05/2017.  Antonio Wells Gout   . Heart murmur   . High cholesterol   . Hypertension   . Mild aortic stenosis   . Mild mitral regurgitation   . OSA on CPAP   . Postoperative atrial fibrillation ([place]) 06/2017  . Type II diabetes mellitus ([place])    Past Surgical History:  Procedure Laterality Date  . CARDIAC CATHETERIZATION    . COLONOSCOPY  2008  . CORONARY ARTERY BYPASS GRAFT N/A 06/24/2017   Procedure: CORONARY ARTERY BYPASS GRAFTING (CABG) x 3 ; Using Left Internal Mammary Artery and Right Great Saphenous Leg Vein Harvested Endoscopically;  Surgeon: Gaye Pollack, MD;  Location: [facility];  Service: Open Heart Surgery;  Laterality: N/A;  . EYE SURGERY    . LEFT HEART CATH AND CORONARY ANGIOGRAPHY N/A 05/25/2017   Procedure: LEFT HEART CATH AND CORONARY ANGIOGRAPHY;  Surgeon: Wellington Hampshire, MD;  Location: [facility];  Service: Cardiovascular;  Laterality: N/A;  . TEE WITHOUT CARDIOVERSION N/A 06/24/2017   Procedure: TRANSESOPHAGEAL ECHOCARDIOGRAM (TEE);  Surgeon: Gaye Pollack, MD;  Location: [facility];  Service: Open Heart Surgery;  Laterality: N/A;   Allergies  Allergen Reactions  . Penicillins Other (See Comments)    Has patient had a PCN reaction causing immediate rash, facial/tongue/throat swelling, SOB or lightheadedness with hypotension: No Has patient had a PCN reaction causing severe rash involving mucus membranes or skin necrosis: No Has patient had a PCN reaction that required hospitalization: No Has patient had a PCN reaction occurring within the last 10 years: No If all  of the above answers are "NO", then may proceed with Cephalosporin use.   Passed out ? SYNCOPE ?  Antonio Wells Lisinopril Cough  . Amiodarone Nausea And Vomiting  . Oxycodone Other (See Comments)    hallucinations  . Zofran [Ondansetron Hcl]     Has an interaction with another medication pt takes   . Cortisone Nausea And Vomiting and Other (See Comments)    HICCUPS  . Tramadol Nausea And Vomiting and Other (See Comments)    HICCUPS   Prior to Admission medications   Medication Sig Start Date End Date Taking? Authorizing Provider  amLODipine (NORVASC) 10 MG tablet Take 1 tablet (10 mg total) by mouth daily. 12/29/17   [name], MD  aspirin EC 81 MG tablet Take 1 tablet (81 mg total) by mouth daily. 09/02/17   Dunn, Nedra Hai, PA-C  atorvastatin (LIPITOR) 80 MG tablet Take 1 tablet (80 mg total)  by mouth daily at 6 PM. 12/29/17   [name], MD  colchicine 0.6 MG tablet Take 1-2 tablets (0.6-1.2 mg total) by mouth daily as needed (for gout flare). 2 po at onset of flair and then repeat in 1 h for acute gout attack then 1 po qd until pain free 05/31/13   Harvie Heck, PA-C  furosemide (LASIX) 20 MG tablet Take 1 tablet (20 mg total) by mouth daily as needed for fluid or edema. 12/29/17   [name], MD  metFORMIN (GLUCOPHAGE) 500 MG tablet Take 500 mg by mouth 2 (two) times daily with a meal.    [provider]  metoprolol tartrate (LOPRESSOR) 50 MG tablet Take 1 tablet (50 mg total) by mouth 2 (two) times daily. 12/29/17   [name], MD   Social History   Socioeconomic History  . Marital status: Married    Spouse name: [name]  . Number of children: 3  . Years of education: 21  . Highest education level: Not on file  Occupational History  . Occupation: Musician  Social Needs  . Financial resource strain: Not on file  . Food insecurity:    Worry: Not on file    Inability: Not on file  . Transportation needs:    Medical: Not on file    Non-medical: Not on  file  Tobacco Use  . Smoking status: Former Smoker    Packs/day: 2.00    Years: 15.00    Pack years: 30.00    Types: Cigarettes    Last attempt to quit: 07/26/1998    Years since quitting: 19.4  . Smokeless tobacco: Never Used  Substance and Sexual Activity  . Alcohol use: No    Alcohol/week: 0.0 oz  . Drug use: No  . Sexual activity: Yes  Lifestyle  . Physical activity:    Days per week: Not on file    Minutes per session: Not on file  . Stress: Not on file  Relationships  . Social connections:    Talks on phone: Not on file    Gets together: Not on file    Attends religious service: Not on file    Active member of club or organization: Not on file    Attends meetings of clubs or organizations: Not on file    Relationship status: Not on file  . Intimate partner violence:    Fear of current or ex partner: Not on file    Emotionally abused: Not on file    Physically abused: Not on file    Forced sexual activity: Not on file  Other Topics Concern  . Not on file  Social History Narrative   Patient is married [name]) and lives at home with his wife.   Patient has three adult children.   Patient is retired.   Patient is a Research officer, political party.   Patient is right-handed.   Patient drinks one cup of coffee daily and 2-3 sodas per day.      Review of Systems  Constitutional: Negative for fatigue and unexpected weight change.  Eyes: Negative for visual disturbance.  Respiratory: Negative for cough, chest tightness and shortness of breath.   Cardiovascular: Negative for chest pain, palpitations and leg swelling.  Gastrointestinal: Negative for abdominal pain, anal bleeding, Wells in stool, constipation, diarrhea, nausea and vomiting.  Neurological: Negative for dizziness, light-headedness and headaches.       Objective:   Physical Exam  Constitutional: He is oriented to person, place, and time.  He appears well-developed and well-nourished.  HENT:  Head: Normocephalic and  atraumatic.  Eyes: Pupils are equal, round, and reactive to light. EOM are normal.  Neck: No JVD present. Carotid bruit is not present.  Cardiovascular: Normal rate and regular rhythm.  Murmur heard.  Systolic (right upper sternal border) murmur is present with a grade of 2/6. Pulmonary/Chest: Effort normal and breath sounds normal. He has no rales.  Musculoskeletal: He exhibits edema (trace pitting edema at the ankles only).  Neurological: He is alert and oriented to person, place, and time.  Skin: Skin is warm and dry.  Psychiatric: He has a normal mood and affect.  Vitals reviewed.   Vitals:   12/30/17 1455  BP: 122/81  Pulse: 71  Resp: 16  Temp: 98 F (36.7 C)  TempSrc: Oral  SpO2: 97%  Weight: 183 lb (83 kg)  Height: 5\' 3"  (1.6 m)       Assessment & Plan:   SHAKIR PETROSINO is a 72 y.o. male Gout of left ankle, unspecified cause, unspecified chronicity - Plan: Uric Acid, colchicine 0.6 MG tablet  -Rare/including flares of gout.  Most recent flare improves within a few days.  Will check uric acid, colchicine was provided if needed for acute flares.  Correct dosing discussed and RTC precautions if frequent flares.  Also advised to hold lovastatin while taking colchicine due to possible combination risks.  Type 2 diabetes mellitus with chronic kidney disease, without long-term current use of insulin, unspecified CKD stage ([place]) - Plan: Microalbumin, urine, Hemoglobin A1c, metFORMIN (GLUCOPHAGE) 500 MG tablet  -Uncontrolled in March.  Stressed importance of glycemic control with history of CAD, CABG, chronic kidney disease.  Check A1c, continue metformin 500 mg twice daily for now.    -hypertension, paroxysmal A. fib without known recurrence off amiodarone.  Tolerating current regimen metoprolol, amlodipine, episodic Lasix.  -Not fasting today, will hold on lipid panel tolerating  Lipitor at current doses.  Health maintenance  -Stressed importance of scheduling colonoscopy,  especially with provided history that there is a family history of colon cancer in his brother  - may need more frequent monitoring.   -Declines pneumonia vaccination.   Meds ordered this encounter  Medications  . metFORMIN (GLUCOPHAGE) 500 MG tablet    Sig: Take 1 tablet (500 mg total) by mouth 2 (two) times daily with a meal.    Dispense:  180 tablet    Refill:  1  . colchicine 0.6 MG tablet    Sig: Take 1-2 tablets (0.6-1.2 mg total) by mouth daily as needed (for gout flare). 2 po at onset of flair and then repeat in 1 h for acute gout attack    Dispense:  30 tablet    Refill:  0   Patient Instructions    For gout, I did refill the colchicine - only use if needed and then only 2 pills initially with 1 pill an hour later if needed. No further doses that day. I will check gout test.   No change in diabetes meds at this time. I will let you know recommendations after A1C.   Please call and schedule repeat colonoscopy for colon cancer screening.   Follow up in 3 months. We can potentially check fasting labs at that time if needed. Thank you for coming in today.    IF you received an x-Wells today, you will receive an invoice from [facility]. Please contact [facility] at [phone] with questions or concerns regarding your invoice.  IF you received labwork today, you will receive an invoice from [facility]. Please contact [facility] at [phone] with questions or concerns regarding your invoice.   Our billing staff will not be able to assist you with questions regarding bills from these companies.  You will be contacted with the lab results as soon as they are available. The fastest way to get your results is to activate your My Chart account. Instructions are located on the last page of this paperwork. If you have not heard from [place] regarding the results in 2 weeks, please contact this office.       I personally performed the services described in this  documentation, which was scribed in my presence. The recorded information has been reviewed and considered for accuracy and completeness, addended by me as needed, and agree with information above.  Signed,   Antonio Ray, MD [facility].  12/30/17 3:41 PM

## 2017-12-30 NOTE — Patient Instructions (Addendum)
  For gout, I did refill the colchicine - only use if needed and then only 2 pills initially with 1 pill an hour later if needed. No further doses that day. I will check gout test.   No change in diabetes meds at this time. I will let you know recommendations after A1C.   Please call and schedule repeat colonoscopy for colon cancer screening.   Follow up in 3 months. We can potentially check fasting labs at that time if needed. Thank you for coming in today.    IF you received an x-ray today, you will receive an invoice from [facility]. Please contact [facility] at [phone] with questions or concerns regarding your invoice.   IF you received labwork today, you will receive an invoice from [facility]. Please contact [facility] at [phone] with questions or concerns regarding your invoice.   Our billing staff will not be able to assist you with questions regarding bills from these companies.  You will be contacted with the lab results as soon as they are available. The fastest way to get your results is to activate your My Chart account. Instructions are located on the last page of this paperwork. If you have not heard from [place] regarding the results in 2 weeks, please contact this office.

## 2017-12-31 LAB — HEMOGLOBIN A1C
Est. average glucose Bld gHb Est-mCnc: 163 mg/dL
HEMOGLOBIN A1C: 7.3 % — AB (ref 4.8–5.6)

## 2017-12-31 LAB — URIC ACID: Uric Acid: 6.8 mg/dL (ref 3.7–8.6)

## 2017-12-31 LAB — MICROALBUMIN, URINE: MICROALBUM., U, RANDOM: 208.5 ug/mL

## 2018-01-02 NOTE — Progress Notes (Deleted)
[facility]  PATIENT: Antonio Wells DOB: Oct 15, 1945   REASON FOR VISIT: Follow-up for obstructive sleep apnea with CPAP compliance HISTORY FROM:    HISTORY OF PRESENT ILLNESS: Antonio Wells is a 72 y.o. male , who  is seen here as a referral  from Dr. Carlota Raspberry for sleep apnea evaluation,   07 September 2017- this is a revisit for Antonio Wells, meanwhile 72 years of age, and established CPAP patient in our practice whom I had last seen in May 2016.  At the time he had just requalified by a new sleep test for the diagnosis of OSA and had received CPAP supplies.  His first revisit showed good compliance.  In the meantime he has not followed up and he states that by late 2018 he had been hospitalized at [place] for triple cardiac bypass surgery, he could not tolerate the CPAP at the current settings and stopped using it. The last time I saw the patient use the machine for a week was between 25 November and 26 June 2017,  with an average use of time of 4 hours and 29 minutes, reaching an AHI of 2.0 when used.  He did have high air leaks, with CPAP was set at 11 cm water pressure with 3 cm EPR based on his last sleep study from 2015.  At this time I would need a new sleep study once again to confirm if the diagnosis of apnea is still present, and would likely then treat the patient with an auto titration machine which has the ability to adjust pressures.  His current machine is barely 71 years old. He is considered non compliant. [facility] is his DME.      REVIEW OF SYSTEMS: Full 14 system review of systems performed and notable only for those listed, all others are neg:  Constitutional: neg  Cardiovascular: neg Ear/Nose/Throat: neg  Skin: neg Eyes: neg Respiratory: neg Gastroitestinal: neg  Hematology/Lymphatic: neg  Endocrine: neg Musculoskeletal:neg Allergy/Immunology: neg Neurological: neg Psychiatric: neg Sleep :  neg   ALLERGIES: Allergies  Allergen Reactions  . Penicillins Other (See Comments)    Has patient had a PCN reaction causing immediate rash, facial/tongue/throat swelling, SOB or lightheadedness with hypotension: No Has patient had a PCN reaction causing severe rash involving mucus membranes or skin necrosis: No Has patient had a PCN reaction that required hospitalization: No Has patient had a PCN reaction occurring within the last 10 years: No If all of the above answers are "NO", then may proceed with Cephalosporin use.   Passed out ? SYNCOPE ?  Marland Kitchen Lisinopril Cough  . Amiodarone Nausea And Vomiting  . Oxycodone Other (See Comments)    hallucinations  . Zofran [Ondansetron Hcl]     Has an interaction with another medication pt takes   . Cortisone Nausea And Vomiting and Other (See Comments)    HICCUPS  . Tramadol Nausea And Vomiting and Other (See Comments)    HICCUPS    HOME MEDICATIONS: Outpatient Medications Prior to Visit  Medication Sig Dispense Refill  . amLODipine (NORVASC) 10 MG tablet Take 1 tablet (10 mg total) by mouth daily. 90 tablet 1  . aspirin EC 81 MG tablet Take 1 tablet (81 mg total) by mouth daily. 90 tablet 3  . atorvastatin (LIPITOR) 80 MG tablet Take 1 tablet (80 mg total) by mouth daily at 6 PM. 90 tablet 1  . colchicine 0.6 MG tablet Take 1-2 tablets (0.6-1.2 mg total) by mouth  daily as needed (for gout flare). 2 po at onset of flair and then repeat in 1 h for acute gout attack 30 tablet 0  . furosemide (LASIX) 20 MG tablet Take 1 tablet (20 mg total) by mouth daily as needed for fluid or edema. 30 tablet 6  . metFORMIN (GLUCOPHAGE) 500 MG tablet Take 1 tablet (500 mg total) by mouth 2 (two) times daily with a meal. 180 tablet 1  . metoprolol tartrate (LOPRESSOR) 50 MG tablet Take 1 tablet (50 mg total) by mouth 2 (two) times daily. 180 tablet 1   No facility-administered medications prior to visit.     PAST MEDICAL HISTORY: Past Medical History:   Diagnosis Date  . Arthritis    "knees" (07/11/2017)  . CKD (chronic kidney disease), stage III ([place])   . Coronary artery disease    a.  CABGx3 in 05/2017.  Marland Kitchen Gout   . Heart murmur   . High cholesterol   . Hypertension   . Mild aortic stenosis   . Mild mitral regurgitation   . OSA on CPAP   . Postoperative atrial fibrillation ([place]) 06/2017  . Type II diabetes mellitus ([place])     PAST SURGICAL HISTORY: Past Surgical History:  Procedure Laterality Date  . CARDIAC CATHETERIZATION    . COLONOSCOPY  2008  . CORONARY ARTERY BYPASS GRAFT N/A 06/24/2017   Procedure: CORONARY ARTERY BYPASS GRAFTING (CABG) x 3 ; Using Left Internal Mammary Artery and Right Great Saphenous Leg Vein Harvested Endoscopically;  Surgeon: Gaye Pollack, MD;  Location: [facility];  Service: Open Heart Surgery;  Laterality: N/A;  . EYE SURGERY    . LEFT HEART CATH AND CORONARY ANGIOGRAPHY N/A 05/25/2017   Procedure: LEFT HEART CATH AND CORONARY ANGIOGRAPHY;  Surgeon: Wellington Hampshire, MD;  Location: [facility];  Service: Cardiovascular;  Laterality: N/A;  . TEE WITHOUT CARDIOVERSION N/A 06/24/2017   Procedure: TRANSESOPHAGEAL ECHOCARDIOGRAM (TEE);  Surgeon: Gaye Pollack, MD;  Location: [facility];  Service: Open Heart Surgery;  Laterality: N/A;    FAMILY HISTORY: Family History  Problem Relation Age of Onset  . Hypertension Mother   . Kidney disease Mother   . Diabetes Mother   . Hypertension Father   . Cancer Brother        lung  . Stroke Brother   . Colon cancer Neg Hx     SOCIAL HISTORY: Social History   Socioeconomic History  . Marital status: Married    Spouse name: [name]  . Number of children: 3  . Years of education: 40  . Highest education level: Not on file  Occupational History  . Occupation: Musician  Social Needs  . Financial resource strain: Not on file  . Food insecurity:    Worry: Not on file    Inability: Not on file  . Transportation needs:    Medical: Not on file     Non-medical: Not on file  Tobacco Use  . Smoking status: Former Smoker    Packs/day: 2.00    Years: 15.00    Pack years: 30.00    Types: Cigarettes    Last attempt to quit: 07/26/1998    Years since quitting: 19.4  . Smokeless tobacco: Never Used  Substance and Sexual Activity  . Alcohol use: No    Alcohol/week: 0.0 oz  . Drug use: No  . Sexual activity: Yes  Lifestyle  . Physical activity:    Days per week: Not on file  Minutes per session: Not on file  . Stress: Not on file  Relationships  . Social connections:    Talks on phone: Not on file    Gets together: Not on file    Attends religious service: Not on file    Active member of club or organization: Not on file    Attends meetings of clubs or organizations: Not on file    Relationship status: Not on file  . Intimate partner violence:    Fear of current or ex partner: Not on file    Emotionally abused: Not on file    Physically abused: Not on file    Forced sexual activity: Not on file  Other Topics Concern  . Not on file  Social History Narrative   Patient is married [name]) and lives at home with his wife.   Patient has three adult children.   Patient is retired.   Patient is a Research officer, political party.   Patient is right-handed.   Patient drinks one cup of coffee daily and 2-3 sodas per day.        PHYSICAL EXAM  There were no vitals filed for this visit. There is no height or weight on file to calculate BMI.  Generalized: Well developed, in no acute distress  Head: normocephalic and atraumatic,. Oropharynx benign  Neck: Supple, no carotid bruits  Cardiac: Regular rate rhythm, no murmur  Musculoskeletal: No deformity   Neurological examination   Mentation: Alert oriented to time, place, history taking. Attention span and concentration appropriate. Recent and remote memory intact.  Follows all commands speech and language fluent.   Cranial nerve II-XII: Fundoscopic exam reveals sharp disc margins.Pupils were  equal round reactive to light extraocular movements were full, visual field were full on confrontational test. Facial sensation and strength were normal. hearing was intact to finger rubbing bilaterally. Uvula tongue midline. head turning and shoulder shrug were normal and symmetric.Tongue protrusion into cheek strength was normal. Motor: normal bulk and tone, full strength in the BUE, BLE, fine finger movements normal, no pronator drift. No focal weakness Sensory: normal and symmetric to light touch, pinprick, and  Vibration, proprioception  Coordination: finger-nose-finger, heel-to-shin bilaterally, no dysmetria Reflexes: Brachioradialis 2/2, biceps 2/2, triceps 2/2, patellar 2/2, Achilles 2/2, plantar responses were flexor bilaterally. Gait and Station: Rising up from seated position without assistance, normal stance,  moderate stride, good arm swing, smooth turning, able to perform tiptoe, and heel walking without difficulty. Tandem gait is steady  DIAGNOSTIC DATA (LABS, IMAGING, TESTING) - I reviewed patient records, labs, notes, testing and imaging myself where available.  Lab Results  Component Value Date   WBC 3.6 07/30/2017   HGB 12.4 (L) 07/30/2017   HCT 38.0 07/30/2017   MCV 93 07/30/2017   PLT 271 07/30/2017      Component Value Date/Time   NA 144 09/27/2017 1220   K 4.2 09/27/2017 1220   CL 103 09/27/2017 1220   CO2 24 09/27/2017 1220   GLUCOSE 290 (H) 09/27/2017 1220   GLUCOSE 125 (H) 07/14/2017 0658   BUN 16 09/27/2017 1220   CREATININE 1.38 (H) 09/27/2017 1220   CREATININE 1.34 (H) 04/22/2016 1141   CALCIUM 9.5 09/27/2017 1220   PROT 7.1 09/27/2017 1220   ALBUMIN 4.7 09/27/2017 1220   AST 15 09/27/2017 1220   ALT 18 09/27/2017 1220   ALKPHOS 84 09/27/2017 1220   BILITOT 0.4 09/27/2017 1220   GFRNONAA 51 (L) 09/27/2017 1220   GFRNONAA 53 (L) 10/15/2015 1638  GFRAA 59 (L) 09/27/2017 1220   GFRAA 61 10/15/2015 1638   Lab Results  Component Value Date   CHOL  159 05/12/2017   HDL 45 05/12/2017   LDLCALC 81 05/12/2017   TRIG 166 (H) 05/12/2017   CHOLHDL 3.5 05/12/2017   Lab Results  Component Value Date   HGBA1C 7.3 (H) 12/30/2017   No results found for: VITAMINB12 Lab Results  Component Value Date   TSH 0.614 06/25/2017    ***  ASSESSMENT AND PLAN  72 y.o. year old male  has a past medical history of Arthritis, CKD (chronic kidney disease), stage III ([place]), Coronary artery disease, Gout, Heart murmur, High cholesterol, Hypertension, Mild aortic stenosis, Mild mitral regurgitation, OSA on CPAP, Postoperative atrial fibrillation ([place]) (06/2017), and Type II diabetes mellitus ([place]). here with ***    Rayburn Ma, [facility], APRN  [facility] [address] [place], [place] [address] [phone]

## 2018-01-03 ENCOUNTER — Ambulatory Visit: Payer: Medicare Other | Admitting: Nurse Practitioner

## 2018-01-03 ENCOUNTER — Encounter: Payer: Self-pay | Admitting: Nurse Practitioner

## 2018-01-03 NOTE — Progress Notes (Signed)
[facility]  PATIENT: Antonio Wells DOB: [birth year]   REASON FOR VISIT: Follow-up for obstructive sleep apnea with CPAP compliance HISTORY FROM: Patient    HISTORY OF PRESENT ILLNESS:UPDATE 6/12/2019CM  Antonio Wells, 72 year old male returns for follow-up with history of obstructive sleep apnea here for CPAP compliance.  He denies having any difficulty.  Data dated 12/04/2015-01/01/2018 shows compliance greater than 4 hours at 90%.  Average usage 6 hours 48 minutes.  Set pressure 8 cm.  EPR level 3.  Leaks 95th percentile 5.1 AHI 5.2.  He returns for reevaluation   2/3/19CDHarold L Wells is a 72 y.o. male , who  is seen here as a referral  from Antonio Wells for sleep apnea evaluation,   07 September 2017- this is a revisit for [name], meanwhile 72 years of age, and established CPAP patient in our practice whom I had last seen in May 2016.  At the time he had just requalified by a new sleep test for the diagnosis of OSA and had received CPAP supplies.  His first revisit showed good compliance.  In the meantime he has not followed up and he states that by late 2018 he had been hospitalized at [place] for triple cardiac bypass surgery, he could not tolerate the CPAP at the current settings and stopped using it. The last time I saw the patient use the machine for a week was between 25 November and 26 June 2017,  with an average use of time of 4 hours and 29 minutes, reaching an AHI of 2.0 when used.  He did have high air leaks, with CPAP was set at 11 cm water pressure with 3 cm EPR based on his last sleep study from 2015.  At this time I would need a new sleep study once again to confirm if the diagnosis of apnea is still present, and would likely then treat the patient with an auto titration machine which has the ability to adjust pressures.  His current machine is barely 71 years old. He is considered non compliant. [facility] is his DME.      REVIEW  OF SYSTEMS: Full 14 system review of systems performed and notable only for those listed, all others are neg:  Constitutional: neg  Cardiovascular: neg Ear/Nose/Throat: neg  Skin: neg Eyes: neg Respiratory: neg Gastroitestinal: neg  Hematology/Lymphatic: neg  Endocrine: neg Musculoskeletal: Knee pain Allergy/Immunology: neg Neurological: neg Psychiatric: neg Sleep : Obstructive sleep apnea with CPAP   ALLERGIES: Allergies  Allergen Reactions  . Penicillins Other (See Comments)    Has patient had a PCN reaction causing immediate rash, facial/tongue/throat swelling, SOB or lightheadedness with hypotension: No Has patient had a PCN reaction causing severe rash involving mucus membranes or skin necrosis: No Has patient had a PCN reaction that required hospitalization: No Has patient had a PCN reaction occurring within the last 10 years: No If all of the above answers are "NO", then may proceed with Cephalosporin use.   Passed out ? SYNCOPE ?  Marland Kitchen Lisinopril Cough  . Amiodarone Nausea And Vomiting  . Oxycodone Other (See Comments)    hallucinations  . Zofran [Ondansetron Hcl]     Has an interaction with another medication pt takes   . Cortisone Nausea And Vomiting and Other (See Comments)    HICCUPS  . Tramadol Nausea And Vomiting and Other (See Comments)    HICCUPS    HOME MEDICATIONS: Outpatient Medications Prior to Visit  Medication Sig Dispense Refill  .  amLODipine (NORVASC) 10 MG tablet Take 1 tablet (10 mg total) by mouth daily. 90 tablet 1  . aspirin EC 81 MG tablet Take 1 tablet (81 mg total) by mouth daily. 90 tablet 3  . atorvastatin (LIPITOR) 80 MG tablet Take 1 tablet (80 mg total) by mouth daily at 6 PM. 90 tablet 1  . colchicine 0.6 MG tablet Take 1-2 tablets (0.6-1.2 mg total) by mouth daily as needed (for gout flare). 2 po at onset of flair and then repeat in 1 h for acute gout attack 30 tablet 0  . furosemide (LASIX) 20 MG tablet Take 1 tablet (20 mg total)  by mouth daily as needed for fluid or edema. 30 tablet 6  . metFORMIN (GLUCOPHAGE) 500 MG tablet Take 1 tablet (500 mg total) by mouth 2 (two) times daily with a meal. 180 tablet 1  . metoprolol tartrate (LOPRESSOR) 50 MG tablet Take 1 tablet (50 mg total) by mouth 2 (two) times daily. 180 tablet 1   No facility-administered medications prior to visit.     PAST MEDICAL HISTORY: Past Medical History:  Diagnosis Date  . Arthritis    "knees" (07/11/2017)  . CKD (chronic kidney disease), stage III ([place])   . Coronary artery disease    a.  CABGx3 in 05/2017.  Marland Kitchen Gout   . Heart murmur   . High cholesterol   . Hypertension   . Mild aortic stenosis   . Mild mitral regurgitation   . OSA on CPAP   . Postoperative atrial fibrillation ([place]) 06/2017  . Type II diabetes mellitus ([place])     PAST SURGICAL HISTORY: Past Surgical History:  Procedure Laterality Date  . CARDIAC CATHETERIZATION    . COLONOSCOPY  2008  . CORONARY ARTERY BYPASS GRAFT N/A 06/24/2017   Procedure: CORONARY ARTERY BYPASS GRAFTING (CABG) x 3 ; Using Left Internal Mammary Artery and Right Great Saphenous Leg Vein Harvested Endoscopically;  Surgeon: Gaye Pollack, MD;  Location: [facility];  Service: Open Heart Surgery;  Laterality: N/A;  . EYE SURGERY    . LEFT HEART CATH AND CORONARY ANGIOGRAPHY N/A 05/25/2017   Procedure: LEFT HEART CATH AND CORONARY ANGIOGRAPHY;  Surgeon: Wellington Hampshire, MD;  Location: [facility];  Service: Cardiovascular;  Laterality: N/A;  . TEE WITHOUT CARDIOVERSION N/A 06/24/2017   Procedure: TRANSESOPHAGEAL ECHOCARDIOGRAM (TEE);  Surgeon: Gaye Pollack, MD;  Location: [facility];  Service: Open Heart Surgery;  Laterality: N/A;    FAMILY HISTORY: Family History  Problem Relation Age of Onset  . Hypertension Mother   . Kidney disease Mother   . Diabetes Mother   . Hypertension Father   . Cancer Brother        lung  . Stroke Brother   . Colon cancer Neg Hx     SOCIAL HISTORY: Social  History   Socioeconomic History  . Marital status: Married    Spouse name: Antonio Wells  . Number of children: 3  . Years of education: 58  . Highest education level: Not on file  Occupational History  . Occupation: Musician  Social Needs  . Financial resource strain: Not on file  . Food insecurity:    Worry: Not on file    Inability: Not on file  . Transportation needs:    Medical: Not on file    Non-medical: Not on file  Tobacco Use  . Smoking status: Former Smoker    Packs/day: 2.00    Years: 15.00    Pack  years: 30.00    Types: Cigarettes    Last attempt to quit: 07/26/1998    Years since quitting: 19.4  . Smokeless tobacco: Never Used  Substance and Sexual Activity  . Alcohol use: No    Alcohol/week: 0.0 oz  . Drug use: No  . Sexual activity: Yes  Lifestyle  . Physical activity:    Days per week: Not on file    Minutes per session: Not on file  . Stress: Not on file  Relationships  . Social connections:    Talks on phone: Not on file    Gets together: Not on file    Attends religious service: Not on file    Active member of club or organization: Not on file    Attends meetings of clubs or organizations: Not on file    Relationship status: Not on file  . Intimate partner violence:    Fear of current or ex partner: Not on file    Emotionally abused: Not on file    Physically abused: Not on file    Forced sexual activity: Not on file  Other Topics Concern  . Not on file  Social History Narrative   Patient is married Antonio Wells) and lives at home with his wife.   Patient has three adult children.   Patient is retired.   Patient is a Research officer, political party.   Patient is right-handed.   Patient drinks one cup of coffee daily and 2-3 sodas per day.        PHYSICAL EXAM  Vitals:   01/04/18 1257  BP: (!) 145/80  Pulse: 68  Weight: 181 lb (82.1 kg)  Height: 5\' 3"  (1.6 m)   Body mass index is 32.06 kg/m.  Generalized: Well developed, in no acute distress  Head:  normocephalic and atraumatic,. Oropharynx benign Mallopatti3 Neck: Supple, circumference 18.5 Lungs clear  Musculoskeletal: No deformity  Skin no edema Neurological examination   Mentation: Alert oriented to time, place, history taking. Attention span and concentration appropriate. Recent and remote memory intact.  Follows all commands speech and language fluent.   Cranial nerve II-XII: Pupils were equal round reactive to light extraocular movements were full, visual field were full on confrontational test. Facial sensation and strength were normal. hearing was intact to finger rubbing bilaterally. Uvula tongue midline. head turning and shoulder shrug were normal and symmetric.Tongue protrusion into cheek strength was normal. Motor: normal bulk and tone, full strength in the BUE, BLE,  Sensory: normal and symmetric to light touch,  Coordination: finger-nose-finger, heel-to-shin bilaterally, no dysmetria Gait and Station: Rising up from seated position without assistance, normal stance,  moderate stride, good arm swing, smooth turning, able to perform tiptoe, and heel walking without difficulty. Tandem gait is steady  DIAGNOSTIC DATA (LABS, IMAGING, TESTING) - I reviewed patient records, labs, notes, testing and imaging myself where available.  Lab Results  Component Value Date   WBC 3.6 07/30/2017   HGB 12.4 (L) 07/30/2017   HCT 38.0 07/30/2017   MCV 93 07/30/2017   PLT 271 07/30/2017      Component Value Date/Time   NA 144 09/27/2017 1220   K 4.2 09/27/2017 1220   CL 103 09/27/2017 1220   CO2 24 09/27/2017 1220   GLUCOSE 290 (H) 09/27/2017 1220   GLUCOSE 125 (H) 07/14/2017 0658   BUN 16 09/27/2017 1220   CREATININE 1.38 (H) 09/27/2017 1220   CREATININE 1.34 (H) 04/22/2016 1141   CALCIUM 9.5 09/27/2017 1220   PROT 7.1 09/27/2017 1220  ALBUMIN 4.7 09/27/2017 1220   AST 15 09/27/2017 1220   ALT 18 09/27/2017 1220   ALKPHOS 84 09/27/2017 1220   BILITOT 0.4 09/27/2017 1220    GFRNONAA 51 (L) 09/27/2017 1220   GFRNONAA 53 (L) 10/15/2015 1638   GFRAA 59 (L) 09/27/2017 1220   GFRAA 61 10/15/2015 1638   Lab Results  Component Value Date   CHOL 159 05/12/2017   HDL 45 05/12/2017   LDLCALC 81 05/12/2017   TRIG 166 (H) 05/12/2017   CHOLHDL 3.5 05/12/2017   Lab Results  Component Value Date   HGBA1C 7.3 (H) 12/30/2017   No results found for: VITAMINB12 Lab Results  Component Value Date   TSH 0.614 06/25/2017      ASSESSMENT AND PLAN  72 y.o. year old male  has a past medical history of Arthritis, CKD (chronic kidney disease), stage III ([place]), Coronary artery disease, Gout, High cholesterol, Hypertension, Mild aortic stenosis, Mild mitral regurgitation, OSA on CPAP, Postoperative atrial fibrillation ([place]) (06/2017), and Type II diabetes mellitus ([place]). here to follow-up for CPAP compliance.Data dated 12/04/2015-01/01/2018 shows compliance greater than 4 hours at 90%.  Average usage 6 hours 48 minutes.  Set pressure 8 cm.  EPR level 3.  Leaks 95th percentile 5.1 AHI 5.2.    PLAN: CPAP compliance 90% Continue same settings  Follow up in 6 months then yearly Dennie Bible, [facility], [facility], [facility] [address], [place], [place] [address] [phone]

## 2018-01-04 ENCOUNTER — Ambulatory Visit (INDEPENDENT_AMBULATORY_CARE_PROVIDER_SITE_OTHER): Payer: Medicare Other | Admitting: Nurse Practitioner

## 2018-01-04 ENCOUNTER — Encounter: Payer: Self-pay | Admitting: Nurse Practitioner

## 2018-01-04 ENCOUNTER — Telehealth: Payer: Self-pay | Admitting: Nurse Practitioner

## 2018-01-04 DIAGNOSIS — I2510 Atherosclerotic heart disease of native coronary artery without angina pectoris: Secondary | ICD-10-CM

## 2018-01-04 DIAGNOSIS — G4730 Sleep apnea, unspecified: Secondary | ICD-10-CM

## 2018-01-04 NOTE — Patient Instructions (Signed)
CPAP compliance 90% Continue same settings Follow-up in 6 months then yearly  

## 2018-01-04 NOTE — Telephone Encounter (Signed)
Patient states he will call us to schedule his 6 month f/u appt // Hassell Done

## 2018-01-11 ENCOUNTER — Other Ambulatory Visit: Payer: Self-pay | Admitting: Family Medicine

## 2018-01-11 DIAGNOSIS — I10 Essential (primary) hypertension: Secondary | ICD-10-CM

## 2018-01-12 NOTE — Telephone Encounter (Signed)
[facility] called and spoke to [place]. I asked about the most recent prescriptions sent on 12/29/17 for amlodipine and metoprolol. She says both will be going out around 01/14/18 and the patient must have requested the refills online. She says to disregard and she will delete from her end.

## 2018-01-13 NOTE — Progress Notes (Signed)
I agree with the assessment and plan as directed by NP .The patient is known to me .   Glena Pharris, MD  

## 2018-01-25 IMAGING — CR DG CHEST 2V
2 series · 2 of 2 positions shown · non-contrast
Comparison: Chest x-ray dated February 03, 2017.

CLINICAL DATA: Preoperative examination for CABG.

EXAM:
CHEST  2 VIEW

[w chest pa]
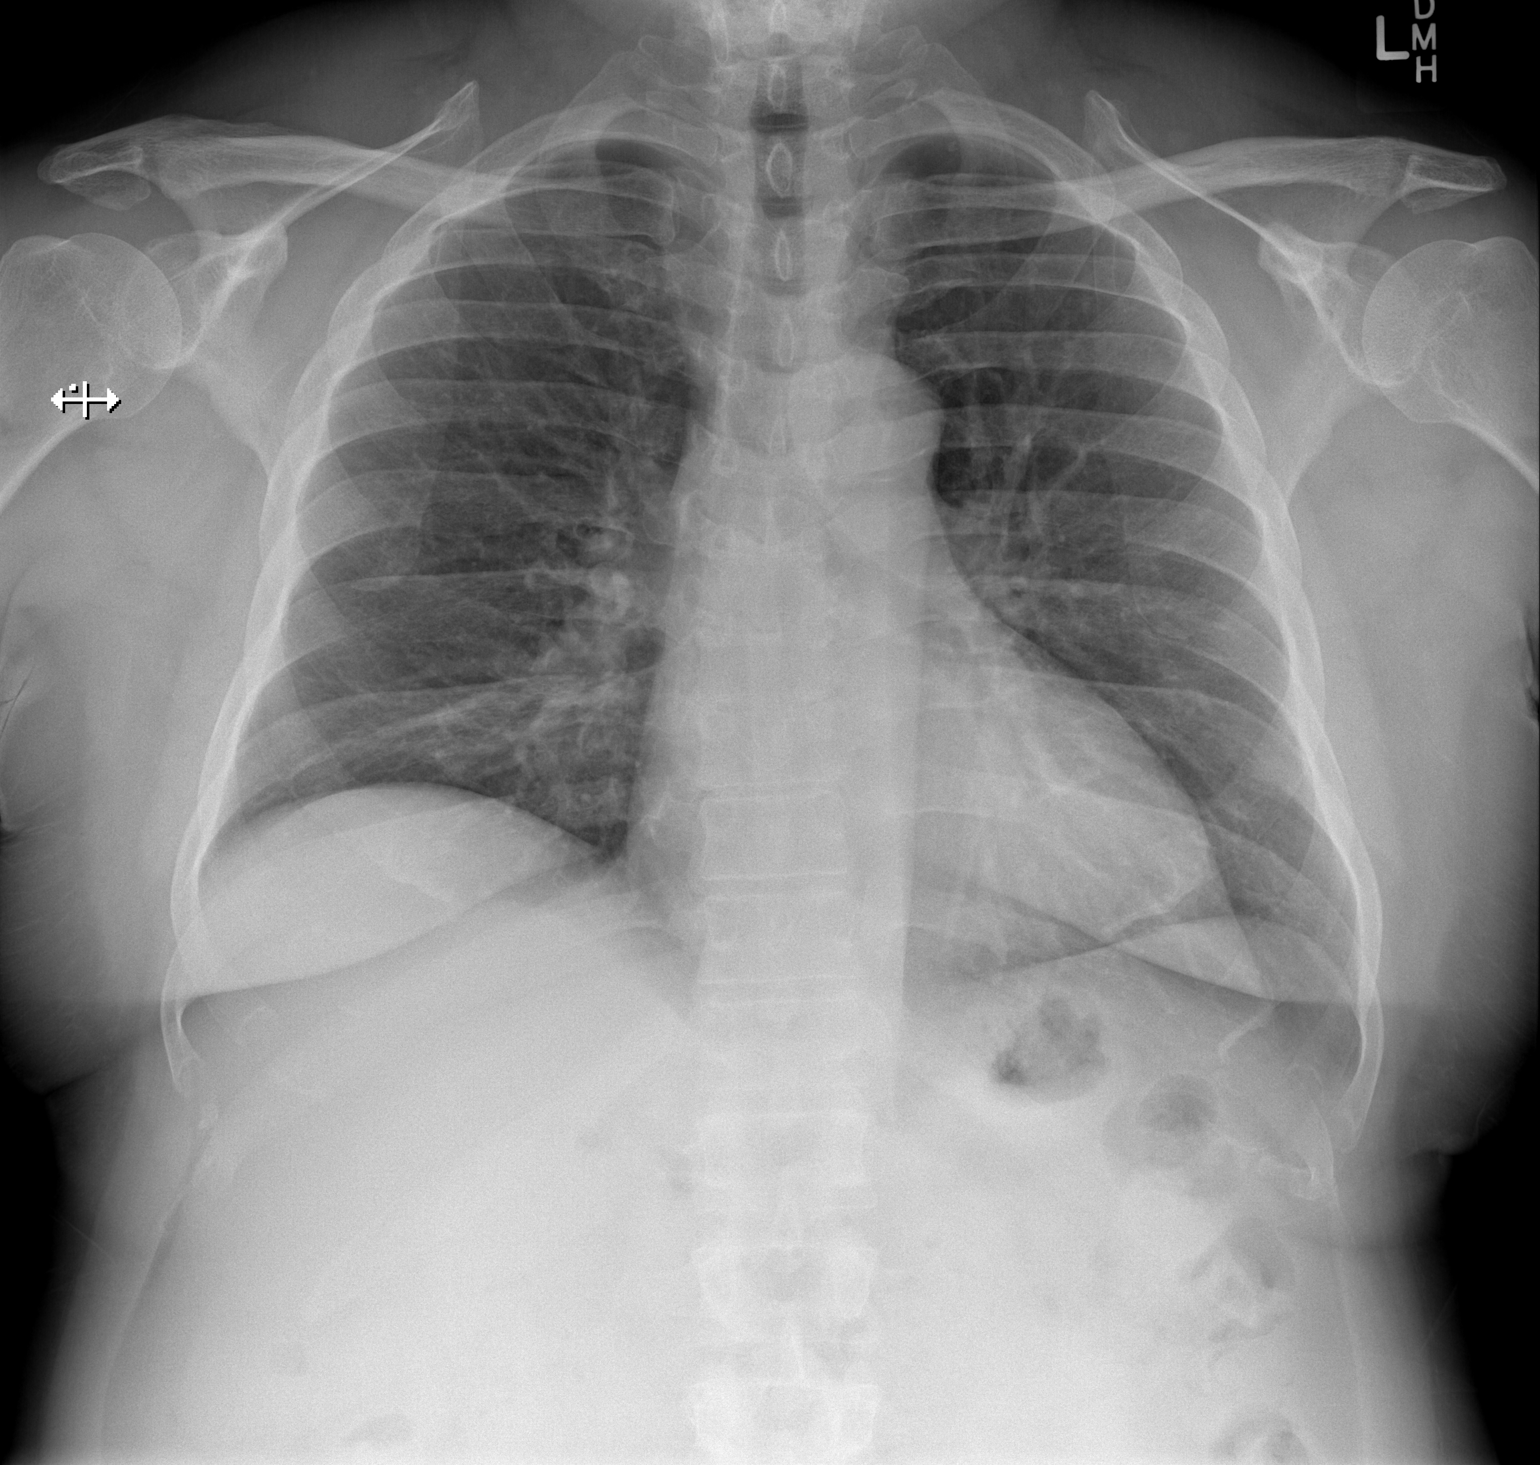

[w chest lat]
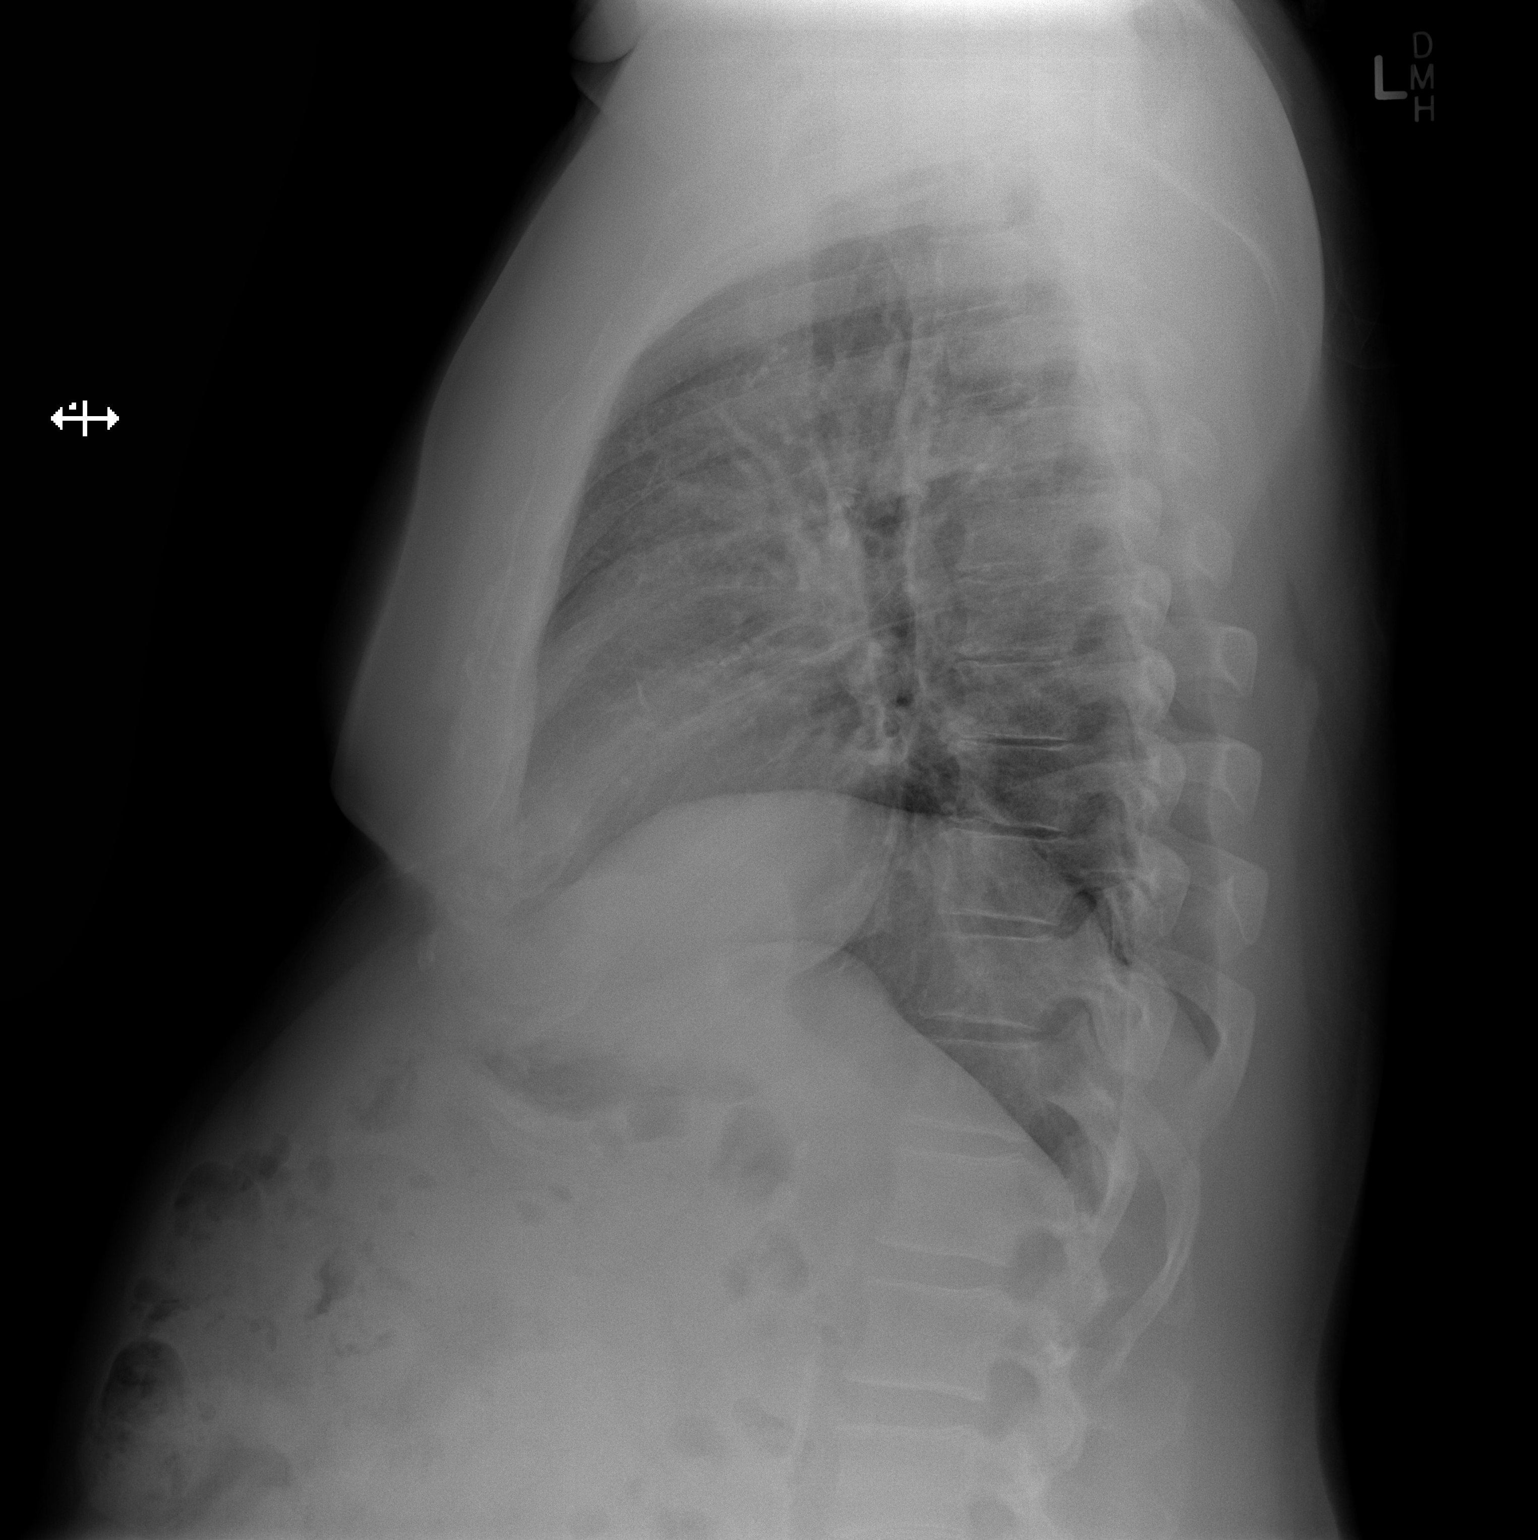

[2 of 2 positions shown; findings below may reference images not displayed]

FINDINGS: The cardiomediastinal silhouette is normal in size. Normal pulmonary
vascularity. No focal consolidation, pleural effusion, or
pneumothorax. No acute osseous abnormality.
IMPRESSION: No active cardiopulmonary disease.

## 2018-01-31 IMAGING — CR DG CHEST 1V PORT
1 series · 1 of 1 positions shown · non-contrast
Comparison: 06/25/2017

CLINICAL DATA: Chest tube

EXAM:
PORTABLE CHEST 1 VIEW

[AP]
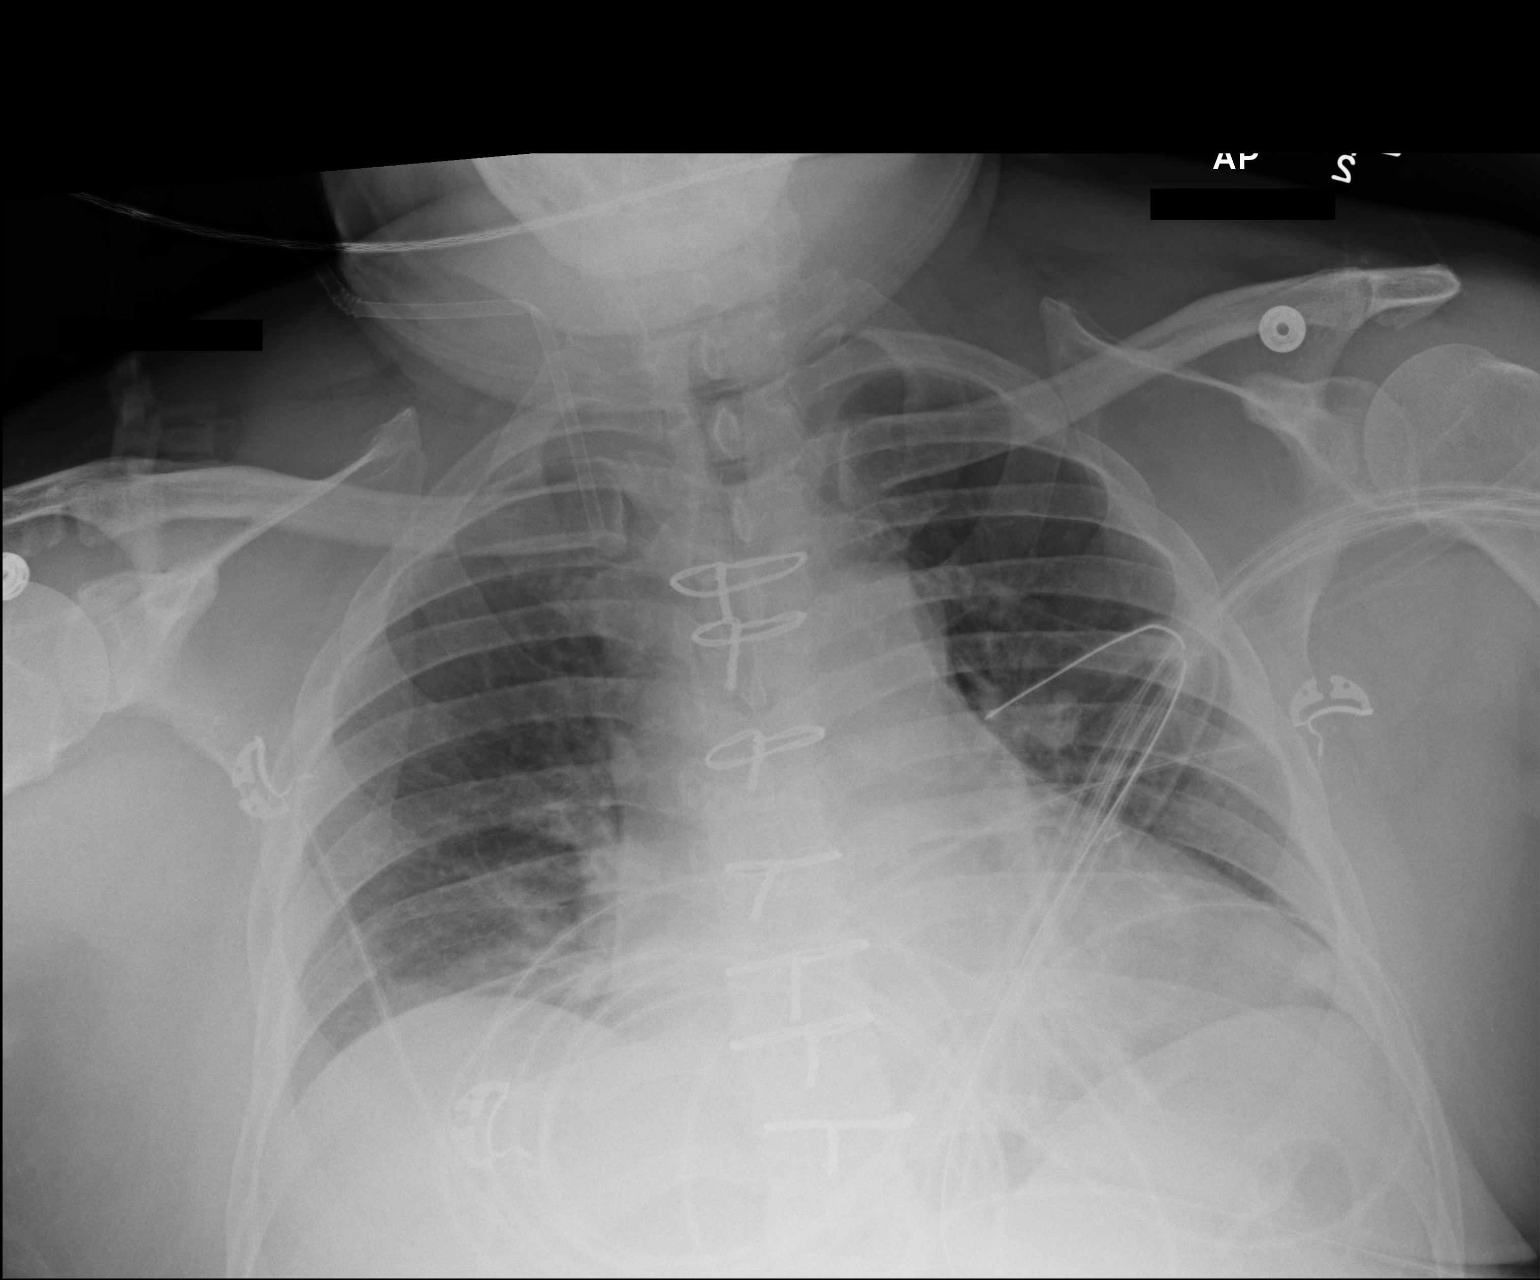

[1 of 1 positions shown; findings below may reference images not displayed]

FINDINGS: Interval removal of Swan-Ganz catheter. Left chest tube remains in
place. No pneumothorax. Cardiomegaly with vascular congestion and
bibasilar atelectasis.
IMPRESSION: Cardiomegaly, vascular congestion and bibasilar atelectasis, similar
to prior study.

## 2018-02-01 IMAGING — DX DG CHEST 1V PORT
1 series · 1 of 1 positions shown · non-contrast
Comparison: Portable chest x-ray June 26, 2017

CLINICAL DATA: Status post CABG, chest tube removal.

EXAM:
PORTABLE CHEST 1 VIEW

[chest]
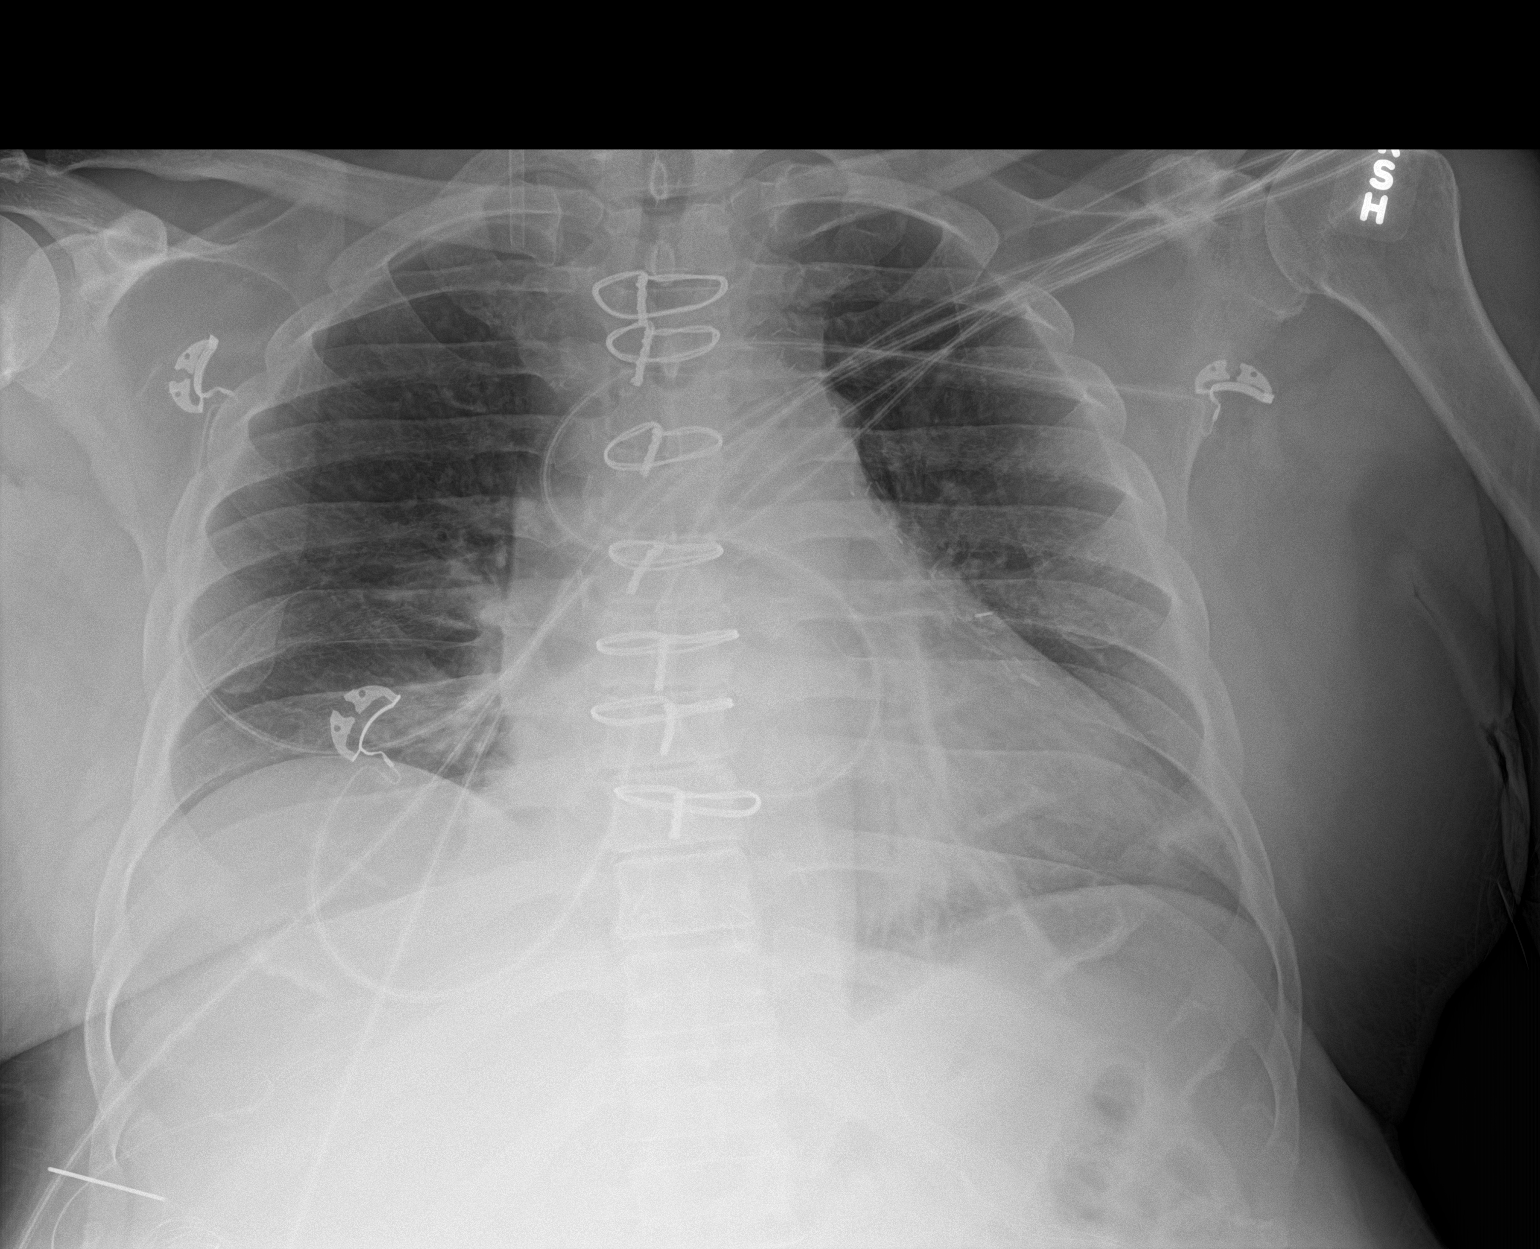

[1 of 1 positions shown; findings below may reference images not displayed]

FINDINGS: There has been interval removal of the left chest tube. The left
lung is well-expanded. There remain coarse retrocardiac lung
markings medially on the left. There is no pneumothorax or
significant pleural effusion. The right lung is mildly hypoinflated
with minimal infrahilar density. The cardiac silhouette is enlarged.
The pulmonary vascularity is normal. The sternal wires are intact.
TheCordis sheath tip projects over the distal aspect of the right
internal jugular vein. Eighteen 10 the midportion of the catheter
remains visible.
IMPRESSION: Mild bibasilar atelectasis, no pneumothorax or significant pleural
effusion since chest tube removal. No pulmonary edema.

## 2018-03-03 IMAGING — CR DG CHEST 2V
2 series · 2 of 2 positions shown · non-contrast
Comparison: 07/07/2017 and 06/20/2017

CLINICAL DATA: CABG on 06/24/2017.

EXAM:
CHEST  2 VIEW

[w chest pa]
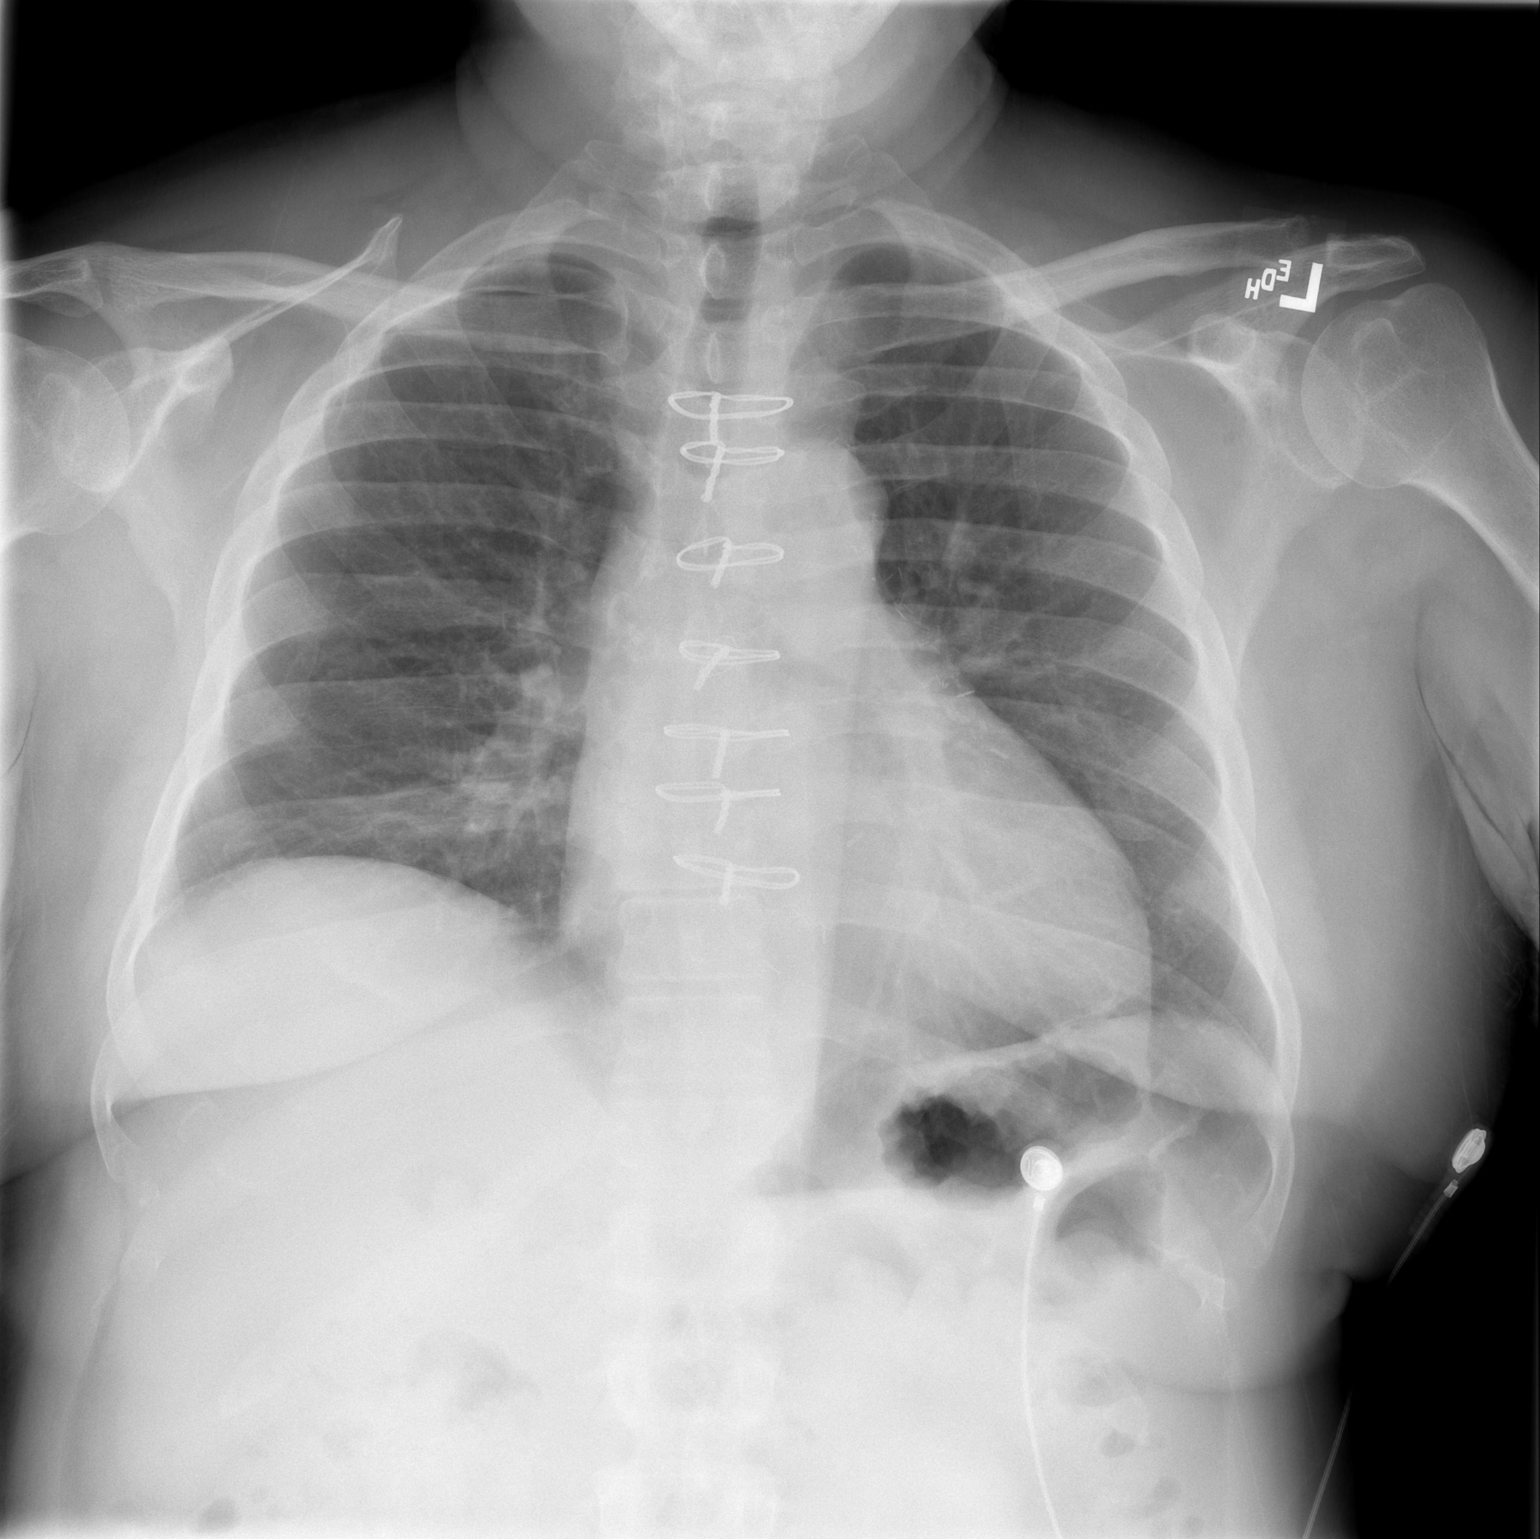

[w chest lat]
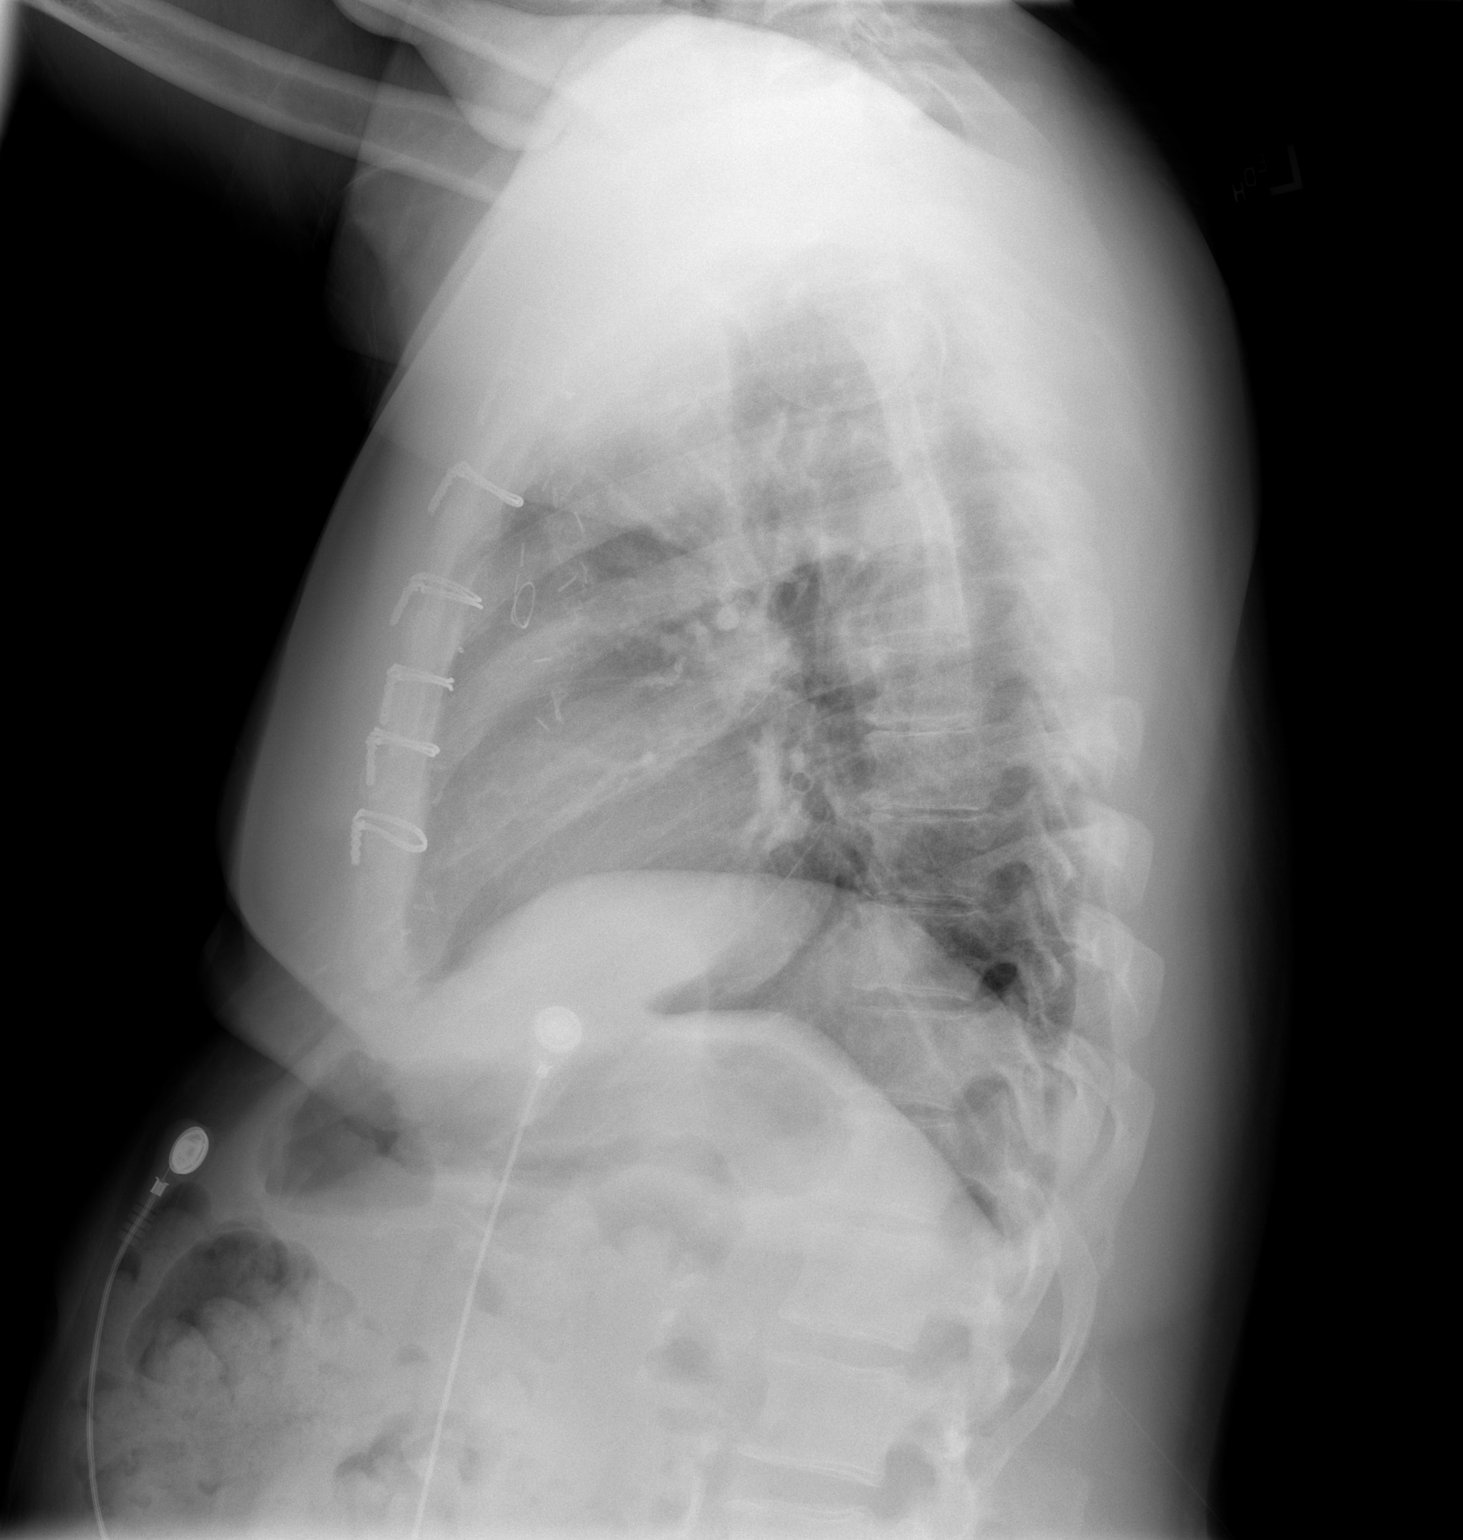

[2 of 2 positions shown; findings below may reference images not displayed]

FINDINGS: The heart size and mediastinal contours are within normal limits.
Both lungs are clear. No effusions. The visualized skeletal
structures are unremarkable. CABG.
IMPRESSION: No active cardiopulmonary disease.

## 2018-03-17 ENCOUNTER — Other Ambulatory Visit: Payer: Self-pay

## 2018-03-17 ENCOUNTER — Ambulatory Visit (INDEPENDENT_AMBULATORY_CARE_PROVIDER_SITE_OTHER): Payer: Medicare Other | Admitting: Emergency Medicine

## 2018-03-17 ENCOUNTER — Encounter: Payer: Self-pay | Admitting: Emergency Medicine

## 2018-03-17 VITALS — BP 118/67 | HR 77 | Temp 98.5°F | Resp 16 | Ht 63.0 in | Wt 189.4 lb

## 2018-03-17 DIAGNOSIS — J22 Unspecified acute lower respiratory infection: Secondary | ICD-10-CM

## 2018-03-17 DIAGNOSIS — I2510 Atherosclerotic heart disease of native coronary artery without angina pectoris: Secondary | ICD-10-CM | POA: Diagnosis not present

## 2018-03-17 DIAGNOSIS — R05 Cough: Secondary | ICD-10-CM

## 2018-03-17 DIAGNOSIS — R059 Cough, unspecified: Secondary | ICD-10-CM

## 2018-03-17 MED ORDER — PROMETHAZINE-CODEINE 6.25-10 MG/5ML PO SYRP
5.0000 mL | ORAL_SOLUTION | Freq: Every evening | ORAL | 0 refills | Status: DC | PRN
Start: 2018-03-17 — End: 2018-03-17

## 2018-03-17 MED ORDER — BENZONATATE 200 MG PO CAPS: 200.0000 mg | ORAL_CAPSULE | Freq: Two times a day (BID) | ORAL | 0 refills | Status: DC | PRN

## 2018-03-17 MED ORDER — PROMETHAZINE-CODEINE 6.25-10 MG/5ML PO SYRP: 5.0000 mL | ORAL_SOLUTION | Freq: Every evening | ORAL | 0 refills | Status: DC | PRN

## 2018-03-17 MED ORDER — DOXYCYCLINE HYCLATE 100 MG PO TABS: 100.0000 mg | ORAL_TABLET | Freq: Two times a day (BID) | ORAL | 0 refills | Status: DC

## 2018-03-17 NOTE — Patient Instructions (Addendum)
     If you have lab work done today you will be contacted with your lab results within the next 2 weeks.  If you have not heard from us then please contact us. The fastest way to get your results is to register for My Chart.   IF you received an x-ray today, you will receive an invoice from [facility]. Please contact [facility] at [phone] with questions or concerns regarding your invoice.   IF you received labwork today, you will receive an invoice from [facility]. Please contact [facility] at [phone] with questions or concerns regarding your invoice.   Our billing staff will not be able to assist you with questions regarding bills from these companies.  You will be contacted with the lab results as soon as they are available. The fastest way to get your results is to activate your My Chart account. Instructions are located on the last page of this paperwork. If you have not heard from us regarding the results in 2 weeks, please contact this office.     Cough, Adult A cough helps to clear your throat and lungs. A cough may last only 2-3 weeks (acute), or it may last longer than 8 weeks (chronic). Many different things can cause a cough. A cough may be a sign of an illness or another medical condition. Follow these instructions at home:  Pay attention to any changes in your cough.  Take medicines only as told by your doctor. ? If you were prescribed an antibiotic medicine, take it as told by your doctor. Do not stop taking it even if you start to feel better. ? Talk with your doctor before you try using a cough medicine.  Drink enough fluid to keep your pee (urine) clear or pale yellow.  If the air is dry, use a cold steam vaporizer or humidifier in your home.  Stay away from things that make you cough at work or at home.  If your cough is worse at night, try using extra pillows to raise your head up higher while you sleep.  Do not smoke, and try not to be  around smoke. If you need help quitting, ask your doctor.  Do not have caffeine.  Do not drink alcohol.  Rest as needed. Contact a doctor if:  You have new problems (symptoms).  You cough up yellow fluid (pus).  Your cough does not get better after 2-3 weeks, or your cough gets worse.  Medicine does not help your cough and you are not sleeping well.  You have pain that gets worse or pain that is not helped with medicine.  You have a fever.  You are losing weight and you do not know why.  You have night sweats. Get help right away if:  You cough up blood.  You have trouble breathing.  Your heartbeat is very fast. This information is not intended to replace advice given to you by your health care provider. Make sure you discuss any questions you have with your health care provider. Document Released: 03/25/2011 Document Revised: 12/18/2015 Document Reviewed: 09/18/2014 Elsevier Interactive Patient Education  2018 Elsevier Inc.  

## 2018-03-17 NOTE — Progress Notes (Signed)
Antonio Wells 72 y.o.   Chief Complaint  Patient presents with  . Cough    x 4 days, chest congestion, nasal drainage , diarrhea yesterday and chills     HISTORY OF PRESENT ILLNESS: This is a 72 y.o. male complaining of cough and congestion for the past 4 to 5 days.  Has some nasal drainage.  Had one episode of diarrhea yesterday.  Non-smoker.  No history of asthma or COPD.  Denies difficulty breathing but has noticed bit of wheezing.  Denies chest pain.  No fever or chills.  Nonproductive cough.  No other significant symptoms.  No recent traveling.  No one else sick at home.  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  HPI   Prior to Admission medications   Medication Sig Start Date End Date Taking? Authorizing Provider  amLODipine (NORVASC) 10 MG tablet TAKE 1 TABLET DAILY,       PATIENT NEEDS OFFICE VISIT FOR ADDITIONAL REFILLS 01/12/18  Yes [name], MD  aspirin EC 81 MG tablet Take 1 tablet (81 mg total) by mouth daily. 09/02/17  Yes Dunn, Dayna N, PA-C  atorvastatin (LIPITOR) 80 MG tablet Take 1 tablet (80 mg total) by mouth daily at 6 PM. 12/29/17  Yes [name], MD  colchicine 0.6 MG tablet Take 1-2 tablets (0.6-1.2 mg total) by mouth daily as needed (for gout flare). 2 po at onset of flair and then repeat in 1 h for acute gout attack 12/30/17  Yes [name], MD  furosemide (LASIX) 20 MG tablet Take 1 tablet (20 mg total) by mouth daily as needed for fluid or edema. 12/29/17  Yes [name], MD  metFORMIN (GLUCOPHAGE) 500 MG tablet Take 1 tablet (500 mg total) by mouth 2 (two) times daily with a meal. 12/30/17  Yes [name], MD  metoprolol tartrate (LOPRESSOR) 50 MG tablet TAKE 1 TABLET TWICE A DAY 01/12/18  Yes [name], MD  amLODipine (NORVASC) 10 MG tablet Take 1 tablet (10 mg total) by mouth daily. Patient not taking: Reported on 03/17/2018 12/29/17   [name], MD  metoprolol tartrate (LOPRESSOR) 50 MG tablet Take 1 tablet (50 mg total) by mouth  2 (two) times daily. Patient not taking: Reported on 03/17/2018 12/29/17   [name], MD    Allergies  Allergen Reactions  . Penicillins Other (See Comments)    Has patient had a PCN reaction causing immediate rash, facial/tongue/throat swelling, SOB or lightheadedness with hypotension: No Has patient had a PCN reaction causing severe rash involving mucus membranes or skin necrosis: No Has patient had a PCN reaction that required hospitalization: No Has patient had a PCN reaction occurring within the last 10 years: No If all of the above answers are "NO", then may proceed with Cephalosporin use.   Passed out ? SYNCOPE ?  Marland Kitchen Lisinopril Cough  . Amiodarone Nausea And Vomiting  . Oxycodone Other (See Comments)    hallucinations  . Zofran [Ondansetron Hcl]     Has an interaction with another medication pt takes   . Cortisone Nausea And Vomiting and Other (See Comments)    HICCUPS  . Tramadol Nausea And Vomiting and Other (See Comments)    HICCUPS    Patient Active Problem List   Diagnosis Date Noted  . Sleep apnea with use of continuous positive airway pressure (CPAP) 01/04/2018  . Malnutrition of moderate degree 07/14/2017  . Thrush 07/11/2017  . FTT (failure to thrive) in adult  07/11/2017  . 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 CAD (coronary artery disease) 05/25/2017  . Unstable angina ([place])   . Hypertensive heart disease 07/14/2015  . Lower extremity edema 07/14/2015  . Aortic stenosis 01/09/2015  . OSA on CPAP 12/12/2014  . Hypersomnia with sleep apnea 12/12/2014  . Obstructive sleep apnea (adult) (pediatric) 02/04/2014  . 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 Obesity 02/04/2014  . Mitral regurgitation and aortic stenosis 01/02/2014  . DOE (dyspnea on exertion) 11/26/2013  . Gout 08/12/2011  . Hypertension 08/12/2011  . High cholesterol 08/12/2011  . OSA (obstructive sleep apnea) 08/12/2011  . Diabetes type 2, uncontrolled ([place]) 08/12/2011    Past Medical History:    Diagnosis Date  . Arthritis    "knees" (07/11/2017)  . CKD (chronic kidney disease), stage III ([place])   . Coronary artery disease    a.  CABGx3 in 05/2017.  Marland Kitchen Gout   . Heart murmur   . High cholesterol   . Hypertension   . Mild aortic stenosis   . Mild mitral regurgitation   . OSA on CPAP   . Postoperative atrial fibrillation ([place]) 06/2017  . Type II diabetes mellitus ([place])     Past Surgical History:  Procedure Laterality Date  . CARDIAC CATHETERIZATION    . COLONOSCOPY  2008  . CORONARY ARTERY BYPASS GRAFT N/A 06/24/2017   Procedure: CORONARY ARTERY BYPASS GRAFTING (CABG) x 3 ; Using Left Internal Mammary Artery and Right Great Saphenous Leg Vein Harvested Endoscopically;  Surgeon: Gaye Pollack, MD;  Location: [facility];  Service: Open Heart Surgery;  Laterality: N/A;  . EYE SURGERY    . LEFT HEART CATH AND CORONARY ANGIOGRAPHY N/A 05/25/2017   Procedure: LEFT HEART CATH AND CORONARY ANGIOGRAPHY;  Surgeon: Wellington Hampshire, MD;  Location: [facility];  Service: Cardiovascular;  Laterality: N/A;  . TEE WITHOUT CARDIOVERSION N/A 06/24/2017   Procedure: TRANSESOPHAGEAL ECHOCARDIOGRAM (TEE);  Surgeon: Gaye Pollack, MD;  Location: [facility];  Service: Open Heart Surgery;  Laterality: N/A;    Social History   Socioeconomic History  . Marital status: Married    Spouse name: [name]  . Number of children: 3  . Years of education: 38  . Highest education level: Not on file  Occupational History  . Occupation: Musician  Social Needs  . Financial resource strain: Not on file  . Food insecurity:    Worry: Not on file    Inability: Not on file  . Transportation needs:    Medical: Not on file    Non-medical: Not on file  Tobacco Use  . Smoking status: Former Smoker    Packs/day: 2.00    Years: 15.00    Pack years: 30.00    Types: Cigarettes    Last attempt to quit: 07/26/1998    Years since quitting: 19.6  . Smokeless tobacco: Never Used  Substance and Sexual Activity   . Alcohol use: No    Alcohol/week: 0.0 standard drinks  . Drug use: No  . Sexual activity: Yes  Lifestyle  . Physical activity:    Days per week: Not on file    Minutes per session: Not on file  . Stress: Not on file  Relationships  . Social connections:    Talks on phone: Not on file    Gets together: Not on file    Attends religious service: Not on file    Active member of club or organization: Not on file    Attends  meetings of clubs or organizations: Not on file    Relationship status: Not on file  . Intimate partner violence:    Fear of current or ex partner: Not on file    Emotionally abused: Not on file    Physically abused: Not on file    Forced sexual activity: Not on file  Other Topics Concern  . Not on file  Social History Narrative   Patient is married [name]) and lives at home with his wife.   Patient has three adult children.   Patient is retired.   Patient is a Research officer, political party.   Patient is right-handed.   Patient drinks one cup of coffee daily and 2-3 sodas per day.       Family History  Problem Relation Age of Onset  . Hypertension Mother   . Kidney disease Mother   . Diabetes Mother   . Hypertension Father   . Cancer Brother        lung  . Stroke Brother   . Colon cancer Neg Hx      Review of Systems  Constitutional: Negative.  Negative for chills and fever.  HENT: Positive for congestion. Negative for ear pain and sore throat.   Eyes: Negative.  Negative for blurred vision and double vision.  Respiratory: Positive for cough and wheezing. Negative for hemoptysis and shortness of breath.   Cardiovascular: Negative.  Negative for chest pain and palpitations.  Gastrointestinal: Positive for diarrhea. Negative for abdominal pain, blood in stool, nausea and vomiting.  Genitourinary: Negative for dysuria and hematuria.  Musculoskeletal: Negative.  Negative for back pain, myalgias and neck pain.  Skin: Negative.   Neurological: Negative.  Negative  for dizziness and headaches.  Endo/Heme/Allergies: Negative.   All other systems reviewed and are negative.   Vitals:   03/17/18 1415  BP: 118/67  Pulse: 77  Resp: 16  Temp: 98.5 F (36.9 C)  SpO2: 99%    Physical Exam  Constitutional: He is oriented to person, place, and time. He appears well-developed and well-nourished.  HENT:  Head: Normocephalic and atraumatic.  Right Ear: External ear normal.  Left Ear: External ear normal.  Nose: Nose normal.  Mouth/Throat: Oropharynx is clear and moist.  Eyes: Pupils are equal, round, and reactive to light. Conjunctivae and EOM are normal.  Neck: Normal range of motion. Neck supple. No thyromegaly present.  Cardiovascular: Normal rate, regular rhythm and normal heart sounds.  Pulmonary/Chest: Effort normal and breath sounds normal.  Abdominal: Soft. There is no tenderness.  Musculoskeletal: Normal range of motion. He exhibits no edema or tenderness.  Lymphadenopathy:    He has no cervical adenopathy.  Neurological: He is alert and oriented to person, place, and time. No sensory deficit. He exhibits normal muscle tone.  Skin: Skin is warm and dry. Capillary refill takes less than 2 seconds.  Psychiatric: He has a normal mood and affect. His behavior is normal.  Vitals reviewed.  A total of 25 minutes was spent in the room with the patient, greater than 50% of which was in counseling/coordination of care regarding differential diagnosis, treatment, medications, and need for follow-up if no better or worse.   ASSESSMENT & PLAN: Ardell was seen today for cough.  Diagnoses and all orders for this visit:  Cough -     benzonatate (TESSALON) 200 MG capsule; Take 1 capsule (200 mg total) by mouth 2 (two) times daily as needed for cough. -     promethazine-codeine (PHENERGAN WITH CODEINE) 6.25-10 MG/5ML  syrup; Take 5 mLs by mouth at bedtime as needed for cough.  Lower resp. tract infection -     doxycycline (VIBRA-TABS) 100 MG tablet;  Take 1 tablet (100 mg total) by mouth 2 (two) times daily.    Patient Instructions       If you have lab work done today you will be contacted with your lab results within the next 2 weeks.  If you have not heard from [place] then please contact us. The fastest way to get your results is to register for My Chart.   IF you received an x-ray today, you will receive an invoice from [facility]. Please contact [facility] at [phone] with questions or concerns regarding your invoice.   IF you received labwork today, you will receive an invoice from [facility]. Please contact [facility] at [phone] with questions or concerns regarding your invoice.   Our billing staff will not be able to assist you with questions regarding bills from these companies.  You will be contacted with the lab results as soon as they are available. The fastest way to get your results is to activate your My Chart account. Instructions are located on the last page of this paperwork. If you have not heard from [place] regarding the results in 2 weeks, please contact this office.      Cough, Adult A cough helps to clear your throat and lungs. A cough may last only 2-3 weeks (acute), or it may last longer than 8 weeks (chronic). Many different things can cause a cough. A cough may be a sign of an illness or another medical condition. Follow these instructions at home:  Pay attention to any changes in your cough.  Take medicines only as told by your doctor. ? If you were prescribed an antibiotic medicine, take it as told by your doctor. Do not stop taking it even if you start to feel better. ? Talk with your doctor before you try using a cough medicine.  Drink enough fluid to keep your pee (urine) clear or pale yellow.  If the air is dry, use a cold steam vaporizer or humidifier in your home.  Stay away from things that make you cough at work or at home.  If your cough is worse at night, try using  extra pillows to raise your head up higher while you sleep.  Do not smoke, and try not to be around smoke. If you need help quitting, ask your doctor.  Do not have caffeine.  Do not drink alcohol.  Rest as needed. Contact a doctor if:  You have new problems (symptoms).  You cough up yellow fluid (pus).  Your cough does not get better after 2-3 weeks, or your cough gets worse.  Medicine does not help your cough and you are not sleeping well.  You have pain that gets worse or pain that is not helped with medicine.  You have a fever.  You are losing weight and you do not know why.  You have night sweats. Get help right away if:  You cough up blood.  You have trouble breathing.  Your heartbeat is very fast. This information is not intended to replace advice given to you by your health care provider. Make sure you discuss any questions you have with your health care provider. Document Released: 03/25/2011 Document Revised: 12/18/2015 Document Reviewed: 09/18/2014 Elsevier Interactive Patient Education  2018 Elsevier Inc.      Agustina Caroli, MD [facility]  [facility]  

## 2018-04-04 ENCOUNTER — Ambulatory Visit: Payer: Medicare Other | Admitting: Family Medicine

## 2018-04-27 ENCOUNTER — Ambulatory Visit: Payer: Medicare Other | Admitting: Family Medicine

## 2018-04-28 ENCOUNTER — Encounter: Payer: Self-pay | Admitting: Family Medicine

## 2018-04-28 ENCOUNTER — Ambulatory Visit (INDEPENDENT_AMBULATORY_CARE_PROVIDER_SITE_OTHER): Payer: Medicare Other | Admitting: Family Medicine

## 2018-04-28 VITALS — BP 140/72 | HR 68 | Temp 98.1°F | Resp 17 | Ht 63.0 in | Wt 185.0 lb

## 2018-04-28 DIAGNOSIS — E1122 Type 2 diabetes mellitus with diabetic chronic kidney disease: Secondary | ICD-10-CM

## 2018-04-28 DIAGNOSIS — I10 Essential (primary) hypertension: Secondary | ICD-10-CM

## 2018-04-28 DIAGNOSIS — I2510 Atherosclerotic heart disease of native coronary artery without angina pectoris: Secondary | ICD-10-CM | POA: Diagnosis not present

## 2018-04-28 DIAGNOSIS — E785 Hyperlipidemia, unspecified: Secondary | ICD-10-CM | POA: Diagnosis not present

## 2018-04-28 LAB — POCT GLYCOSYLATED HEMOGLOBIN (HGB A1C): Hemoglobin A1C: 7.5 % — AB (ref 4.0–5.6)

## 2018-04-28 LAB — GLUCOSE, POCT (MANUAL RESULT ENTRY): POC GLUCOSE: 150 mg/dL — AB (ref 70–99)

## 2018-04-28 MED ORDER — GLIPIZIDE 5 MG PO TABS: 5.0000 mg | ORAL_TABLET | Freq: Every day | ORAL | 2 refills | Status: DC

## 2018-04-28 MED ORDER — IRBESARTAN 75 MG PO TABS: 75.0000 mg | ORAL_TABLET | Freq: Every day | ORAL | 2 refills | Status: DC

## 2018-04-28 MED ORDER — METFORMIN HCL 500 MG PO TABS: 500.0000 mg | ORAL_TABLET | Freq: Two times a day (BID) | ORAL | 1 refills | Status: DC

## 2018-04-28 NOTE — Patient Instructions (Addendum)
Please call and schedule colonoscopy.  I recommend flu shot as soon as possible.  Let me know if you change your mind about the pneumonia vaccine.  Start glipizide for diabetes. Continue metformin same dose.  Add irbesartan once per day for blood pressure. Watch for lightheadedness/dizzines and stop that med if it occurs.   Recheck in 1 month for blood pressure and recheck labs.   Thanks for coming in today.   If you have lab work done today you will be contacted with your lab results within the next 2 weeks.  If you have not heard from [place] then please contact us. The fastest way to get your results is to register for My Chart.   IF you received an x-ray today, you will receive an invoice from [facility]. Please contact [facility] at [phone] with questions or concerns regarding your invoice.   IF you received labwork today, you will receive an invoice from [facility]. Please contact [facility] at [phone] with questions or concerns regarding your invoice.   Our billing staff will not be able to assist you with questions regarding bills from these companies.  You will be contacted with the lab results as soon as they are available. The fastest way to get your results is to activate your My Chart account. Instructions are located on the last page of this paperwork. If you have not heard from [place] regarding the results in 2 weeks, please contact this office.

## 2018-04-28 NOTE — Progress Notes (Signed)
Subjective:  By signing my name below, I, [name], attest that this documentation has been prepared under the direction and in the presence of [name], MD Electronically Signed: Ladene Artist, ED Scribe 04/28/2018 at 9:47 AM.   Patient ID: Antonio Wells, male    DOB: Aug 19, 1945, 72 y.o.   MRN: [id number]  Chief Complaint  Patient presents with  . Follow-up    diabetes    HPI Antonio Wells is a 72 y.o. male who presents to Primary Care at [facility] for f/u.  DM with CKD Lab Results  Component Value Date   HGBA1C 7.3 (H) 12/30/2017  Metformin restarted earlier this yr. Making dietary changes prev. In cardiac rehab. Elevated urine micro 3 months ago. Due for pneumovax and foot exam today. Optho: 05/03/17. - Pt states that he rarely misses doses. Reports occasional diarrhea if he doesn't eat with Metformin. He has not regularly checked his blood glucose at home. Pt declines pneumonia vaccine. He plans to receive flu vaccine within a few days.  CKD Lab Results  Component Value Date   CREATININE 1.38 (H) 09/27/2017   CAD s/o CABG Cardiology Dr. Orlean Patten. Seen in June, f/u in Dec. S/p cardiac rehab. Lasix 20 mg prn for edema. BP borderline elevated. Amlodipine 10 mg qd, metoprolol 50 mg bid. ACE inhibitor allergy due to cough, not on ARB. - Home BP averages mid to high 130s/70s. Denies any side-effects or symptoms.  Lipid Screening Lab Results  Component Value Date   CHOL 159 05/12/2017   HDL 45 05/12/2017   LDLCALC 81 05/12/2017   TRIG 166 (H) 05/12/2017   CHOLHDL 3.5 05/12/2017  Lipitor 80 mg qd.  Health Maintenance Colonoscopy: Due, pt plans to call [place] to schedule.  Patient Active Problem List   Diagnosis Date Noted  . Cough 03/17/2018  . Lower resp. tract infection 03/17/2018  . Sleep apnea with use of continuous positive airway pressure (CPAP) 01/04/2018  . Malnutrition of moderate degree 07/14/2017  . Thrush 07/11/2017  . FTT (failure to thrive)  in adult 07/11/2017  . 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 CAD (coronary artery disease) 05/25/2017  . Unstable angina ([place])   . Hypertensive heart disease 07/14/2015  . Lower extremity edema 07/14/2015  . Aortic stenosis 01/09/2015  . OSA on CPAP 12/12/2014  . Hypersomnia with sleep apnea 12/12/2014  . Obstructive sleep apnea (adult) (pediatric) 02/04/2014  . 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 Obesity 02/04/2014  . Mitral regurgitation and aortic stenosis 01/02/2014  . DOE (dyspnea on exertion) 11/26/2013  . Gout 08/12/2011  . Hypertension 08/12/2011  . High cholesterol 08/12/2011  . OSA (obstructive sleep apnea) 08/12/2011  . Diabetes type 2, uncontrolled ([place]) 08/12/2011   Past Medical History:  Diagnosis Date  . Arthritis    "knees" (07/11/2017)  . CKD (chronic kidney disease), stage III ([place])   . Coronary artery disease    a.  CABGx3 in 05/2017.  Marland Kitchen Gout   . Heart murmur   . High cholesterol   . Hypertension   . Mild aortic stenosis   . Mild mitral regurgitation   . OSA on CPAP   . Postoperative atrial fibrillation ([place]) 06/2017  . Type II diabetes mellitus ([place])    Past Surgical History:  Procedure Laterality Date  . CARDIAC CATHETERIZATION    . COLONOSCOPY  2008  . CORONARY ARTERY BYPASS GRAFT N/A 06/24/2017   Procedure: CORONARY ARTERY BYPASS GRAFTING (CABG) x 3 ; Using Left Internal Mammary Artery  and Right Great Saphenous Leg Vein Harvested Endoscopically;  Surgeon: Gaye Pollack, MD;  Location: [facility] OR;  Service: Open Heart Surgery;  Laterality: N/A;  . EYE SURGERY    . LEFT HEART CATH AND CORONARY ANGIOGRAPHY N/A 05/25/2017   Procedure: LEFT HEART CATH AND CORONARY ANGIOGRAPHY;  Surgeon: Wellington Hampshire, MD;  Location: [place] CV LAB;  Service: Cardiovascular;  Laterality: N/A;  . TEE WITHOUT CARDIOVERSION N/A 06/24/2017   Procedure: TRANSESOPHAGEAL ECHOCARDIOGRAM (TEE);  Surgeon: Gaye Pollack, MD;  Location: [facility];  Service: Open Heart Surgery;   Laterality: N/A;   Allergies  Allergen Reactions  . Penicillins Other (See Comments)    Has patient had a PCN reaction causing immediate rash, facial/tongue/throat swelling, SOB or lightheadedness with hypotension: No Has patient had a PCN reaction causing severe rash involving mucus membranes or skin necrosis: No Has patient had a PCN reaction that required hospitalization: No Has patient had a PCN reaction occurring within the last 10 years: No If all of the above answers are "NO", then may proceed with Cephalosporin use.   Passed out ? SYNCOPE ?  Marland Kitchen Lisinopril Cough  . Amiodarone Nausea And Vomiting  . Oxycodone Other (See Comments)    hallucinations  . Zofran [Ondansetron Hcl]     Has an interaction with another medication pt takes   . Cortisone Nausea And Vomiting and Other (See Comments)    HICCUPS  . Tramadol Nausea And Vomiting and Other (See Comments)    HICCUPS   Prior to Admission medications   Medication Sig Start Date End Date Taking? Authorizing Provider  amLODipine (NORVASC) 10 MG tablet Take 1 tablet (10 mg total) by mouth daily. 12/29/17  Yes [name], MD  amLODipine (NORVASC) 10 MG tablet TAKE 1 TABLET DAILY,       PATIENT NEEDS OFFICE VISIT FOR ADDITIONAL REFILLS 01/12/18  Yes [name], MD  aspirin EC 81 MG tablet Take 1 tablet (81 mg total) by mouth daily. 09/02/17  Yes Dunn, Dayna N, PA-C  atorvastatin (LIPITOR) 80 MG tablet Take 1 tablet (80 mg total) by mouth daily at 6 PM. 12/29/17  Yes [name], MD  benzonatate (TESSALON) 200 MG capsule Take 1 capsule (200 mg total) by mouth 2 (two) times daily as needed for cough. 03/17/18  Yes Sagardia, Ines Bloomer, MD  colchicine 0.6 MG tablet Take 1-2 tablets (0.6-1.2 mg total) by mouth daily as needed (for gout flare). 2 po at onset of flair and then repeat in 1 h for acute gout attack 12/30/17  Yes [name], MD  doxycycline (VIBRA-TABS) 100 MG tablet Take 1 tablet (100 mg total) by mouth 2  (two) times daily. 03/17/18  Yes Sagardia, Ines Bloomer, MD  furosemide (LASIX) 20 MG tablet Take 1 tablet (20 mg total) by mouth daily as needed for fluid or edema. 12/29/17  Yes [name], MD  metFORMIN (GLUCOPHAGE) 500 MG tablet Take 1 tablet (500 mg total) by mouth 2 (two) times daily with a meal. 12/30/17  Yes [name], MD  metoprolol tartrate (LOPRESSOR) 50 MG tablet Take 1 tablet (50 mg total) by mouth 2 (two) times daily. 12/29/17  Yes [name], MD  metoprolol tartrate (LOPRESSOR) 50 MG tablet TAKE 1 TABLET TWICE A DAY 01/12/18  Yes [name], MD  promethazine-codeine [facility] WITH CODEINE) 6.25-10 MG/5ML syrup Take 5 mLs by mouth at bedtime as needed for cough. 03/17/18  Yes Sagardia, Ines Bloomer, MD  Social History   Socioeconomic History  . Marital status: Married    Spouse name: [name]  . Number of children: 3  . Years of education: 80  . Highest education level: Not on file  Occupational History  . Occupation: Musician  Social Needs  . Financial resource strain: Not on file  . Food insecurity:    Worry: Not on file    Inability: Not on file  . Transportation needs:    Medical: Not on file    Non-medical: Not on file  Tobacco Use  . Smoking status: Former Smoker    Packs/day: 2.00    Years: 15.00    Pack years: 30.00    Types: Cigarettes    Last attempt to quit: 07/26/1998    Years since quitting: 19.7  . Smokeless tobacco: Never Used  Substance and Sexual Activity  . Alcohol use: No    Alcohol/week: 0.0 standard drinks  . Drug use: No  . Sexual activity: Yes  Lifestyle  . Physical activity:    Days per week: Not on file    Minutes per session: Not on file  . Stress: Not on file  Relationships  . Social connections:    Talks on phone: Not on file    Gets together: Not on file    Attends religious service: Not on file    Active member of club or organization: Not on file    Attends meetings of clubs or organizations: Not on file      Relationship status: Not on file  . Intimate partner violence:    Fear of current or ex partner: Not on file    Emotionally abused: Not on file    Physically abused: Not on file    Forced sexual activity: Not on file  Other Topics Concern  . Not on file  Social History Narrative   Patient is married [name]) and lives at home with his wife.   Patient has three adult children.   Patient is retired.   Patient is a Research officer, political party.   Patient is right-handed.   Patient drinks one cup of coffee daily and 2-3 sodas per day.      Review of Systems  Constitutional: Negative for fatigue and unexpected weight change.  Eyes: Negative for visual disturbance.  Respiratory: Negative for cough, chest tightness and shortness of breath.   Cardiovascular: Negative for chest pain, palpitations and leg swelling.  Gastrointestinal: Positive for diarrhea (occasional). Negative for abdominal pain and blood in stool.  Neurological: Negative for dizziness, light-headedness and headaches.      Objective:   Physical Exam  Constitutional: He is oriented to person, place, and time. He appears well-developed and well-nourished.  HENT:  Head: Normocephalic and atraumatic.  Eyes: Pupils are equal, round, and reactive to light. EOM are normal.  Neck: No JVD present. Carotid bruit is not present.  Cardiovascular: Normal rate, regular rhythm and normal heart sounds.  No murmur heard. Pulmonary/Chest: Effort normal and breath sounds normal. He has no rales.  Musculoskeletal: He exhibits no edema.  Neurological: He is alert and oriented to person, place, and time.  Skin: Skin is warm and dry.  Psychiatric: He has a normal mood and affect.  Vitals reviewed.  Vitals:   04/28/18 0901 04/28/18 0916  BP: (!) 158/76 140/72  Pulse: 68   Resp: 17   Temp: 98.1 F (36.7 C)   TempSrc: Oral   SpO2: 98%   Weight: 185 lb (83.9 kg)   Height:  5\' 3"  (1.6 m)       Assessment & Plan:   Antonio Wells is a 72  y.o. male Type 2 diabetes mellitus with chronic kidney disease, without long-term current use of insulin, unspecified CKD stage ([place]) - Plan: POCT glucose (manual entry), POCT glycosylated hemoglobin (Hb A1C), glipiZIDE (GLUCOTROL) 5 MG tablet, metFORMIN (GLUCOPHAGE) 500 MG tablet  -Still uncontrolled.  Reported difficulty with higher doses of metformin.  Continue same dose at 500 mg twice daily, add glipizide 5 mg daily initially.  Recheck 1 month  Hyperlipidemia, unspecified hyperlipidemia type - Plan: Lipid panel, Comprehensive metabolic panel  -Tolerating current regimen, check labs, continue same dose of statin  Essential hypertension - Plan: irbesartan (AVAPRO) 75 MG tablet  -Borderline control, but with history of cardiac disease and diabetes well and for improved control.  Additionally with microalbuminuria, will start ARB.  Had a cough with ACE inhibitor.  -Avapro 75 mg daily, recheck in 1 month.  May need repeat creatinine at that time.  Meds ordered this encounter  Medications  . glipiZIDE (GLUCOTROL) 5 MG tablet    Sig: Take 1 tablet (5 mg total) by mouth daily before breakfast.    Dispense:  30 tablet    Refill:  2  . irbesartan (AVAPRO) 75 MG tablet    Sig: Take 1 tablet (75 mg total) by mouth daily.    Dispense:  30 tablet    Refill:  2  . metFORMIN (GLUCOPHAGE) 500 MG tablet    Sig: Take 1 tablet (500 mg total) by mouth 2 (two) times daily with a meal.    Dispense:  180 tablet    Refill:  1   Patient Instructions   Please call and schedule colonoscopy.  I recommend flu shot as soon as possible.  Let me know if you change your mind about the pneumonia vaccine.  Start glipizide for diabetes. Continue metformin same dose.  Add irbesartan once per day for blood pressure. Watch for lightheadedness/dizzines and stop that med if it occurs.   Recheck in 1 month for blood pressure and recheck labs.   Thanks for coming in today.   If you have lab work done today you will  be contacted with your lab results within the next 2 weeks.  If you have not heard from [place] then please contact us. The fastest way to get your results is to register for My Chart.   IF you received an x-ray today, you will receive an invoice from [facility]. Please contact [facility] at [phone] with questions or concerns regarding your invoice.   IF you received labwork today, you will receive an invoice from [place]. Please contact [facility] at [phone] with questions or concerns regarding your invoice.   Our billing staff will not be able to assist you with questions regarding bills from these companies.  You will be contacted with the lab results as soon as they are available. The fastest way to get your results is to activate your My Chart account. Instructions are located on the last page of this paperwork. If you have not heard from [place] regarding the results in 2 weeks, please contact this office.       I personally performed the services described in this documentation, which was scribed in my presence. The recorded information has been reviewed and considered for accuracy and completeness, addended by me as needed, and agree with information above.  Signed,   [name], MD Primary Care at [facility].  04/28/18 1:10 PM

## 2018-04-29 LAB — LIPID PANEL
CHOL/HDL RATIO: 2 ratio (ref 0.0–5.0)
CHOLESTEROL TOTAL: 118 mg/dL (ref 100–199)
HDL: 59 mg/dL (ref >39)
LDL Calculated: 43 mg/dL (ref 0–99)
TRIGLYCERIDES: 80 mg/dL (ref 0–149)
VLDL CHOLESTEROL CAL: 16 mg/dL (ref 5–40)

## 2018-04-29 LAB — COMPREHENSIVE METABOLIC PANEL
A/G RATIO: 2 (ref 1.2–2.2)
ALBUMIN: 4.5 g/dL (ref 3.5–4.8)
ALK PHOS: 63 IU/L (ref 39–117)
ALT: 14 IU/L (ref 0–44)
AST: 19 IU/L (ref 0–40)
BILIRUBIN TOTAL: 0.5 mg/dL (ref 0.0–1.2)
BUN/Creatinine Ratio: 13 (ref 10–24)
BUN: 17 mg/dL (ref 8–27)
CHLORIDE: 104 mmol/L (ref 96–106)
CO2: 22 mmol/L (ref 20–29)
Calcium: 9.1 mg/dL (ref 8.6–10.2)
Creatinine, Ser: 1.28 mg/dL — ABNORMAL HIGH (ref 0.76–1.27)
GFR calc Af Amer: 65 mL/min/{1.73_m2} (ref >59)
GFR calc non Af Amer: 56 mL/min/{1.73_m2} — ABNORMAL LOW (ref >59)
GLOBULIN, TOTAL: 2.2 g/dL (ref 1.5–4.5)
Glucose: 147 mg/dL — ABNORMAL HIGH (ref 65–99)
POTASSIUM: 4.4 mmol/L (ref 3.5–5.2)
SODIUM: 143 mmol/L (ref 134–144)
Total Protein: 6.7 g/dL (ref 6.0–8.5)

## 2018-05-08 ENCOUNTER — Encounter: Payer: Self-pay | Admitting: Emergency Medicine

## 2018-05-08 ENCOUNTER — Other Ambulatory Visit: Payer: Self-pay

## 2018-05-08 ENCOUNTER — Ambulatory Visit (INDEPENDENT_AMBULATORY_CARE_PROVIDER_SITE_OTHER): Payer: Medicare Other | Admitting: Emergency Medicine

## 2018-05-08 VITALS — BP 135/70 | HR 72 | Temp 98.0°F | Resp 16 | Wt 187.0 lb

## 2018-05-08 DIAGNOSIS — J22 Unspecified acute lower respiratory infection: Secondary | ICD-10-CM

## 2018-05-08 DIAGNOSIS — J029 Acute pharyngitis, unspecified: Secondary | ICD-10-CM

## 2018-05-08 DIAGNOSIS — R05 Cough: Secondary | ICD-10-CM

## 2018-05-08 DIAGNOSIS — I2510 Atherosclerotic heart disease of native coronary artery without angina pectoris: Secondary | ICD-10-CM | POA: Diagnosis not present

## 2018-05-08 DIAGNOSIS — R059 Cough, unspecified: Secondary | ICD-10-CM

## 2018-05-08 LAB — POCT RAPID STREP A (OFFICE): Rapid Strep A Screen: NEGATIVE

## 2018-05-08 MED ORDER — PROMETHAZINE-CODEINE 6.25-10 MG/5ML PO SYRP: 5.0000 mL | ORAL_SOLUTION | Freq: Every evening | ORAL | 0 refills | Status: DC | PRN

## 2018-05-08 MED ORDER — BENZONATATE 200 MG PO CAPS: 200.0000 mg | ORAL_CAPSULE | Freq: Two times a day (BID) | ORAL | 0 refills | Status: DC | PRN

## 2018-05-08 MED ORDER — AZITHROMYCIN 250 MG PO TABS: ORAL_TABLET | ORAL | 0 refills | Status: DC

## 2018-05-08 NOTE — Progress Notes (Signed)
Antonio Wells 72 y.o.   Chief Complaint  Patient presents with  . Cough    X 2 DAYS nonproductive  . Sore Throat    HISTORY OF PRESENT ILLNESS: This is a 72 y.o. male complaining of productive cough and sore throat for 2 days.  No other significant symptoms.  HPI   Prior to Admission medications   Medication Sig Start Date End Date Taking? Authorizing Provider  amLODipine (NORVASC) 10 MG tablet Take 1 tablet (10 mg total) by mouth daily. 12/29/17  Yes [name], MD  aspirin EC 81 MG tablet Take 1 tablet (81 mg total) by mouth daily. 09/02/17  Yes Dunn, Dayna N, PA-C  atorvastatin (LIPITOR) 80 MG tablet Take 1 tablet (80 mg total) by mouth daily at 6 PM. 12/29/17  Yes [name], MD  colchicine 0.6 MG tablet Take 1-2 tablets (0.6-1.2 mg total) by mouth daily as needed (for gout flare). 2 po at onset of flair and then repeat in 1 h for acute gout attack 12/30/17  Yes [name], MD  furosemide (LASIX) 20 MG tablet Take 1 tablet (20 mg total) by mouth daily as needed for fluid or edema. 12/29/17  Yes [name], MD  glipiZIDE (GLUCOTROL) 5 MG tablet Take 1 tablet (5 mg total) by mouth daily before breakfast. 04/28/18  Yes [name], MD  irbesartan (AVAPRO) 75 MG tablet Take 1 tablet (75 mg total) by mouth daily. 04/28/18  Yes [name], MD  metFORMIN (GLUCOPHAGE) 500 MG tablet Take 1 tablet (500 mg total) by mouth 2 (two) times daily with a meal. 04/28/18  Yes [name], MD  metoprolol tartrate (LOPRESSOR) 50 MG tablet Take 1 tablet (50 mg total) by mouth 2 (two) times daily. 12/29/17  Yes [name], MD  amLODipine (NORVASC) 10 MG tablet TAKE 1 TABLET DAILY,       PATIENT NEEDS OFFICE VISIT FOR ADDITIONAL REFILLS 01/12/18   [name], MD  benzonatate (TESSALON) 200 MG capsule Take 1 capsule (200 mg total) by mouth 2 (two) times daily as needed for cough. 03/17/18   Horald Pollen, MD  doxycycline (VIBRA-TABS) 100 MG tablet  Take 1 tablet (100 mg total) by mouth 2 (two) times daily. 03/17/18   Horald Pollen, MD  metoprolol tartrate (LOPRESSOR) 50 MG tablet TAKE 1 TABLET TWICE A DAY 01/12/18   [name], MD  promethazine-codeine [facility] WITH CODEINE) 6.25-10 MG/5ML syrup Take 5 mLs by mouth at bedtime as needed for cough. 03/17/18   Horald Pollen, MD    Allergies  Allergen Reactions  . Penicillins Other (See Comments)    Has patient had a PCN reaction causing immediate rash, facial/tongue/throat swelling, SOB or lightheadedness with hypotension: No Has patient had a PCN reaction causing severe rash involving mucus membranes or skin necrosis: No Has patient had a PCN reaction that required hospitalization: No Has patient had a PCN reaction occurring within the last 10 years: No If all of the above answers are "NO", then may proceed with Cephalosporin use.   Passed out ? SYNCOPE ?  Marland Kitchen Lisinopril Cough  . Amiodarone Nausea And Vomiting  . Oxycodone Other (See Comments)    hallucinations  . Zofran [Ondansetron Hcl]     Has an interaction with another medication pt takes   . Cortisone Nausea And Vomiting and Other (See Comments)    HICCUPS  . Tramadol Nausea And Vomiting and Other (See Comments)  HICCUPS    Patient Active Problem List   Diagnosis Date Noted  . Cough 03/17/2018  . Lower resp. tract infection 03/17/2018  . Sleep apnea with use of continuous positive airway pressure (CPAP) 01/04/2018  . Malnutrition of moderate degree 07/14/2017  . Thrush 07/11/2017  . FTT (failure to thrive) in adult 07/11/2017  . 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 CAD (coronary artery disease) 05/25/2017  . Unstable angina ([place])   . Hypertensive heart disease 07/14/2015  . Lower extremity edema 07/14/2015  . Aortic stenosis 01/09/2015  . OSA on CPAP 12/12/2014  . Hypersomnia with sleep apnea 12/12/2014  . Obstructive sleep apnea (adult) (pediatric) 02/04/2014  . Hypersomnia, persistent 02/04/2014    . Obesity 02/04/2014  . Mitral regurgitation and aortic stenosis 01/02/2014  . DOE (dyspnea on exertion) 11/26/2013  . Gout 08/12/2011  . Hypertension 08/12/2011  . High cholesterol 08/12/2011  . OSA (obstructive sleep apnea) 08/12/2011  . Diabetes type 2, uncontrolled ([place]) 08/12/2011    Past Medical History:  Diagnosis Date  . Arthritis    "knees" (07/11/2017)  . CKD (chronic kidney disease), stage III ([place])   . Coronary artery disease    a.  CABGx3 in 05/2017.  Marland Kitchen Gout   . Heart murmur   . High cholesterol   . Hypertension   . Mild aortic stenosis   . Mild mitral regurgitation   . OSA on CPAP   . Postoperative atrial fibrillation ([place]) 06/2017  . Type II diabetes mellitus ([place])     Past Surgical History:  Procedure Laterality Date  . CARDIAC CATHETERIZATION    . COLONOSCOPY  2008  . CORONARY ARTERY BYPASS GRAFT N/A 06/24/2017   Procedure: CORONARY ARTERY BYPASS GRAFTING (CABG) x 3 ; Using Left Internal Mammary Artery and Right Great Saphenous Leg Vein Harvested Endoscopically;  Surgeon: Gaye Pollack, MD;  Location: [facility];  Service: Open Heart Surgery;  Laterality: N/A;  . EYE SURGERY    . LEFT HEART CATH AND CORONARY ANGIOGRAPHY N/A 05/25/2017   Procedure: LEFT HEART CATH AND CORONARY ANGIOGRAPHY;  Surgeon: Wellington Hampshire, MD;  Location: [facility];  Service: Cardiovascular;  Laterality: N/A;  . TEE WITHOUT CARDIOVERSION N/A 06/24/2017   Procedure: TRANSESOPHAGEAL ECHOCARDIOGRAM (TEE);  Surgeon: Gaye Pollack, MD;  Location: [facility];  Service: Open Heart Surgery;  Laterality: N/A;    Social History   Socioeconomic History  . Marital status: Married    Spouse name: [name]  . Number of children: 3  . Years of education: 65  . Highest education level: Not on file  Occupational History  . Occupation: Musician  Social Needs  . Financial resource strain: Not on file  . Food insecurity:    Worry: Not on file    Inability: Not on file  . Transportation  needs:    Medical: Not on file    Non-medical: Not on file  Tobacco Use  . Smoking status: Former Smoker    Packs/day: 2.00    Years: 15.00    Pack years: 30.00    Types: Cigarettes    Last attempt to quit: 07/26/1998    Years since quitting: 19.7  . Smokeless tobacco: Never Used  Substance and Sexual Activity  . Alcohol use: No    Alcohol/week: 0.0 standard drinks  . Drug use: No  . Sexual activity: Yes  Lifestyle  . Physical activity:    Days per week: Not on file    Minutes per session: Not  on file  . Stress: Not on file  Relationships  . Social connections:    Talks on phone: Not on file    Gets together: Not on file    Attends religious service: Not on file    Active member of club or organization: Not on file    Attends meetings of clubs or organizations: Not on file    Relationship status: Not on file  . Intimate partner violence:    Fear of current or ex partner: Not on file    Emotionally abused: Not on file    Physically abused: Not on file    Forced sexual activity: Not on file  Other Topics Concern  . Not on file  Social History Narrative   Patient is married [name]) and lives at home with his wife.   Patient has three adult children.   Patient is retired.   Patient is a Research officer, political party.   Patient is right-handed.   Patient drinks one cup of coffee daily and 2-3 sodas per day.       Family History  Problem Relation Age of Onset  . Hypertension Mother   . Kidney disease Mother   . Diabetes Mother   . Hypertension Father   . Cancer Brother        lung  . Stroke Brother   . Colon cancer Neg Hx      Review of Systems  Constitutional: Negative.  Negative for chills and fever.  HENT: Positive for congestion and sore throat.   Eyes: Negative.   Respiratory: Positive for cough and sputum production.   Cardiovascular: Negative for chest pain and palpitations.  Gastrointestinal: Negative.  Negative for abdominal pain, diarrhea, nausea and vomiting.    Genitourinary: Negative.   Musculoskeletal: Negative.   Skin: Negative.  Negative for rash.  Neurological: Negative.  Negative for dizziness and headaches.  Endo/Heme/Allergies: Negative.   All other systems reviewed and are negative.   Vitals:   05/08/18 1023  BP: 135/70  Pulse: 72  Resp: 16  Temp: 98 F (36.7 C)  SpO2: 98%    Physical Exam  Constitutional: He is oriented to person, place, and time. He appears well-developed and well-nourished.  HENT:  Head: Normocephalic and atraumatic.  Mouth/Throat: Uvula is midline. No uvula swelling. Posterior oropharyngeal erythema present. No oropharyngeal exudate or tonsillar abscesses.  Eyes: Pupils are equal, round, and reactive to light. Conjunctivae and EOM are normal.  Neck: Normal range of motion. Neck supple.  Cardiovascular: Normal rate and regular rhythm.  Pulmonary/Chest: Effort normal and breath sounds normal.  Abdominal: Soft. He exhibits no distension. There is no tenderness.  Musculoskeletal: Normal range of motion. He exhibits no edema.  Neurological: He is alert and oriented to person, place, and time. No sensory deficit. He exhibits normal muscle tone.  Skin: Skin is warm and dry. Capillary refill takes less than 2 seconds.  Psychiatric: He has a normal mood and affect. His behavior is normal.  Vitals reviewed.   Results for orders placed or performed in visit on 05/08/18 (from the past 24 hour(s))  POCT rapid strep A     Status: None   Collection Time: 05/08/18 10:40 AM  Result Value Ref Range   Rapid Strep A Screen Negative Negative    ASSESSMENT & PLAN: Trigger was seen today for cough and sore throat.  Diagnoses and all orders for this visit:  Cough -     benzonatate (TESSALON) 200 MG capsule; Take 1 capsule (200 mg  total) by mouth 2 (two) times daily as needed for cough. -     promethazine-codeine (PHENERGAN WITH CODEINE) 6.25-10 MG/5ML syrup; Take 5 mLs by mouth at bedtime as needed for  cough.  Sorethroat -     POCT rapid strep A -     Culture, Group A Strep  Lower resp. tract infection -     azithromycin (ZITHROMAX) 250 MG tablet; Sig as indicated  A total of 25 minutes was spent in the room with the patient, greater than 50% of which was in counseling/coordination of care regarding differential diagnosis, management, medications, prognosis, and need for follow-up if no better or worse.   Patient Instructions       If you have lab work done today you will be contacted with your lab results within the next 2 weeks.  If you have not heard from [place] then please contact us. The fastest way to get your results is to register for My Chart.   IF you received an x-ray today, you will receive an invoice from [facility]. Please contact [facility] at [phone] with questions or concerns regarding your invoice.   IF you received labwork today, you will receive an invoice from [facility]. Please contact [facility] at [phone] with questions or concerns regarding your invoice.   Our billing staff will not be able to assist you with questions regarding bills from these companies.  You will be contacted with the lab results as soon as they are available. The fastest way to get your results is to activate your My Chart account. Instructions are located on the last page of this paperwork. If you have not heard from [place] regarding the results in 2 weeks, please contact this office.     Cough, Adult A cough helps to clear your throat and lungs. A cough may last only 2-3 weeks (acute), or it may last longer than 8 weeks (chronic). Many different things can cause a cough. A cough may be a sign of an illness or another medical condition. Follow these instructions at home:  Pay attention to any changes in your cough.  Take medicines only as told by your doctor. ? If you were prescribed an antibiotic medicine, take it as told by your doctor. Do not stop taking it  even if you start to feel better. ? Talk with your doctor before you try using a cough medicine.  Drink enough fluid to keep your pee (urine) clear or pale yellow.  If the air is dry, use a cold steam vaporizer or humidifier in your home.  Stay away from things that make you cough at work or at home.  If your cough is worse at night, try using extra pillows to raise your head up higher while you sleep.  Do not smoke, and try not to be around smoke. If you need help quitting, ask your doctor.  Do not have caffeine.  Do not drink alcohol.  Rest as needed. Contact a doctor if:  You have new problems (symptoms).  You cough up yellow fluid (pus).  Your cough does not get better after 2-3 weeks, or your cough gets worse.  Medicine does not help your cough and you are not sleeping well.  You have pain that gets worse or pain that is not helped with medicine.  You have a fever.  You are losing weight and you do not know why.  You have night sweats. Get help right away if:  You cough up blood.  You have trouble breathing.  Your heartbeat is very fast. This information is not intended to replace advice given to you by your health care provider. Make sure you discuss any questions you have with your health care provider. Document Released: 03/25/2011 Document Revised: 12/18/2015 Document Reviewed: 09/18/2014 Elsevier Interactive Patient Education  2018 Elsevier Inc.      Agustina Caroli, MD [facility]

## 2018-05-08 NOTE — Patient Instructions (Addendum)
     If you have lab work done today you will be contacted with your lab results within the next 2 weeks.  If you have not heard from us then please contact us. The fastest way to get your results is to register for My Chart.   IF you received an x-ray today, you will receive an invoice from [facility]. Please contact [facility] at [phone] with questions or concerns regarding your invoice.   IF you received labwork today, you will receive an invoice from [facility]. Please contact [facility] at [phone] with questions or concerns regarding your invoice.   Our billing staff will not be able to assist you with questions regarding bills from these companies.  You will be contacted with the lab results as soon as they are available. The fastest way to get your results is to activate your My Chart account. Instructions are located on the last page of this paperwork. If you have not heard from us regarding the results in 2 weeks, please contact this office.     Cough, Adult A cough helps to clear your throat and lungs. A cough may last only 2-3 weeks (acute), or it may last longer than 8 weeks (chronic). Many different things can cause a cough. A cough may be a sign of an illness or another medical condition. Follow these instructions at home:  Pay attention to any changes in your cough.  Take medicines only as told by your doctor. ? If you were prescribed an antibiotic medicine, take it as told by your doctor. Do not stop taking it even if you start to feel better. ? Talk with your doctor before you try using a cough medicine.  Drink enough fluid to keep your pee (urine) clear or pale yellow.  If the air is dry, use a cold steam vaporizer or humidifier in your home.  Stay away from things that make you cough at work or at home.  If your cough is worse at night, try using extra pillows to raise your head up higher while you sleep.  Do not smoke, and try not to be  around smoke. If you need help quitting, ask your doctor.  Do not have caffeine.  Do not drink alcohol.  Rest as needed. Contact a doctor if:  You have new problems (symptoms).  You cough up yellow fluid (pus).  Your cough does not get better after 2-3 weeks, or your cough gets worse.  Medicine does not help your cough and you are not sleeping well.  You have pain that gets worse or pain that is not helped with medicine.  You have a fever.  You are losing weight and you do not know why.  You have night sweats. Get help right away if:  You cough up blood.  You have trouble breathing.  Your heartbeat is very fast. This information is not intended to replace advice given to you by your health care provider. Make sure you discuss any questions you have with your health care provider. Document Released: 03/25/2011 Document Revised: 12/18/2015 Document Reviewed: 09/18/2014 Elsevier Interactive Patient Education  2018 Elsevier Inc.  

## 2018-05-10 LAB — CULTURE, GROUP A STREP: STREP A CULTURE: NEGATIVE

## 2018-05-11 ENCOUNTER — Encounter: Payer: Self-pay | Admitting: Gastroenterology

## 2018-05-11 ENCOUNTER — Telehealth: Payer: Self-pay | Admitting: Family Medicine

## 2018-05-11 NOTE — Telephone Encounter (Signed)
Copied from [place] [id number]. Topic: General - Other >> May 11, 2018  4:13 PM Keene Breath wrote: Reason for CRM: Patient called to inform the Dr. Mitchel Honour that the cough medication he prescribed a few days ago is not working and the patient is still coughing a lot. Patient would like for the doctor to prescribed another cough medicine that he took before when he had the same kind of cough, (Homatropine syrup).  Patient stated that this worked much better for him.  Please advise.  CB# [phone]

## 2018-05-12 NOTE — Telephone Encounter (Signed)
Patient was called and stated that the cough

## 2018-05-12 NOTE — Telephone Encounter (Signed)
Cough medication is not helping at all.  States he is still coughing throughout the night regardless of taking the medication. He states he was told to take 5 ml but he increased his dose to 10 ml and still no effect.  He would like to be given Homatropine syrup.  He states he had this medication before and it works.

## 2018-05-15 ENCOUNTER — Ambulatory Visit (AMBULATORY_SURGERY_CENTER): Payer: Self-pay

## 2018-05-15 ENCOUNTER — Encounter: Payer: Self-pay | Admitting: *Deleted

## 2018-05-15 VITALS — Ht 63.0 in | Wt 188.8 lb

## 2018-05-15 DIAGNOSIS — Z1211 Encounter for screening for malignant neoplasm of colon: Secondary | ICD-10-CM

## 2018-05-15 MED ORDER — NA SULFATE-K SULFATE-MG SULF 17.5-3.13-1.6 GM/177ML PO SOLN: 1.0000 | Freq: Once | ORAL | 0 refills | Status: AC

## 2018-05-15 NOTE — Progress Notes (Signed)
Denies allergies to eggs or soy products. Denies complication of anesthesia or sedation. Denies use of weight loss medication. Denies use of O2.   Emmi instructions declined.  

## 2018-05-15 NOTE — Telephone Encounter (Signed)
If still sick and no better, should be seen and evaluated again.

## 2018-05-18 NOTE — Telephone Encounter (Signed)
Patient has an appointment on 05/29/2018.

## 2018-05-19 ENCOUNTER — Other Ambulatory Visit: Payer: Self-pay | Admitting: Family Medicine

## 2018-05-20 ENCOUNTER — Other Ambulatory Visit: Payer: Self-pay | Admitting: Family Medicine

## 2018-05-20 DIAGNOSIS — E1122 Type 2 diabetes mellitus with diabetic chronic kidney disease: Secondary | ICD-10-CM

## 2018-05-22 NOTE — Telephone Encounter (Signed)
Requested Prescriptions  Pending Prescriptions Disp Refills  . glipiZIDE (GLUCOTROL) 5 MG tablet [Pharmacy Med Name: GLIPIZIDE 5 MG TABLET] 30 tablet 2    Sig: TAKE 1 TABLET (5 MG TOTAL) BY MOUTH DAILY BEFORE BREAKFAST.     Endocrinology:  Diabetes - Sulfonylureas Passed - 05/20/2018  9:24 AM      Passed - HBA1C is between 0 and 7.9 and within 180 days    Hemoglobin A1C  Date Value Ref Range Status  04/28/2018 7.5 (A) 4.0 - 5.6 % Final   Hgb A1c MFr Bld  Date Value Ref Range Status  12/30/2017 7.3 (H) 4.8 - 5.6 % Final    Comment:             Prediabetes: 5.7 - 6.4          Diabetes: >6.4          Glycemic control for adults with diabetes: <7.0          Passed - Valid encounter within last 6 months    Recent Outpatient Visits          2 weeks ago Cough   [facility] [place], [place], MD   3 weeks ago Type 2 diabetes mellitus with chronic kidney disease, without long-term current use of insulin, unspecified CKD stage [facility])   [facility], Ranell Patrick, MD   2 months ago Cough   [facility], [place], MD   4 months ago Gout of left ankle, unspecified cause, unspecified chronicity   [facility] [place], MD   7 months ago Type 2 diabetes mellitus with hyperglycemia, without long-term current use of insulin [facility])   [facility], Ranell Patrick, MD      Future Appointments            In 1 week Carlota Raspberry Ranell Patrick, MD [facility] [place], [facility]

## 2018-05-29 ENCOUNTER — Other Ambulatory Visit: Payer: Self-pay

## 2018-05-29 ENCOUNTER — Ambulatory Visit (INDEPENDENT_AMBULATORY_CARE_PROVIDER_SITE_OTHER): Payer: Medicare Other | Admitting: Family Medicine

## 2018-05-29 ENCOUNTER — Encounter: Payer: Self-pay | Admitting: Family Medicine

## 2018-05-29 VITALS — BP 126/60 | HR 65 | Temp 98.4°F | Resp 18 | Ht 63.0 in | Wt 188.4 lb

## 2018-05-29 DIAGNOSIS — Z23 Encounter for immunization: Secondary | ICD-10-CM | POA: Diagnosis not present

## 2018-05-29 DIAGNOSIS — I10 Essential (primary) hypertension: Secondary | ICD-10-CM

## 2018-05-29 DIAGNOSIS — R0981 Nasal congestion: Secondary | ICD-10-CM | POA: Diagnosis not present

## 2018-05-29 DIAGNOSIS — R42 Dizziness and giddiness: Secondary | ICD-10-CM

## 2018-05-29 DIAGNOSIS — R05 Cough: Secondary | ICD-10-CM

## 2018-05-29 DIAGNOSIS — R059 Cough, unspecified: Secondary | ICD-10-CM

## 2018-05-29 DIAGNOSIS — I2510 Atherosclerotic heart disease of native coronary artery without angina pectoris: Secondary | ICD-10-CM

## 2018-05-29 DIAGNOSIS — E785 Hyperlipidemia, unspecified: Secondary | ICD-10-CM | POA: Diagnosis not present

## 2018-05-29 MED ORDER — IPRATROPIUM BROMIDE 0.06 % NA SOLN: 2.0000 | Freq: Four times a day (QID) | NASAL | 1 refills | Status: DC | PRN

## 2018-05-29 NOTE — Progress Notes (Signed)
Subjective:  By signing my name below, I, Antonio Wells, attest that this documentation has been prepared under the direction and in the presence of [name], MD. Electronically Signed: [name], [place]. 05/29/2018 , 2:05 PM .  Patient was seen in Room 14 .   Patient ID: Antonio Wells, male    DOB: [birth year], 71 y.o.   MRN: [id number] Chief Complaint  Patient presents with  . Hypertension    follow up    HPI Antonio Wells is a 72 y.o. male  Here for follow up. Last seen on Oct 4th.   Cough Patient states he's felt some drainage in the back of his throat and in his ears. He's had non productive cough. He saw Dr. Mitchel Honour on Oct 14th, thought viral syndrome. He hasn't used a nasal spray. He reports improvement since last visit with Dr. Mitchel Honour. He denies any relief with tessalon pearls. He denies any heartburn.   HTN BP Readings from Last 3 Encounters:  05/29/18 126/60  05/08/18 135/70  04/28/18 140/72    Lab Results  Component Value Date   CREATININE 1.28 (H) 04/28/2018   We had added Avapro 75 mg qd for improved control and for nephro-protection with micro albuminuria.   He hasn't been checking his BP at home. He notes having brief dizziness when he stands up with positional changes. He denies slurred speech, facial droop, headache, or weakness. He mentions this has been present since prior visit.   Diabetes He was having difficulty with higher doses of metformin. Glipizide 5 mg qd was added with continuing metformin 500 mg bid.   Lab Results  Component Value Date   HGBA1C 7.5 (A) 04/28/2018   He has a glucometer at home, but he admits he hasn't been checking his sugars at home. After starting glipizide, he can "feel" sugars dipping low. He has possibly taken glipizide without eating; occurs more during the day, and not in the evenings. He states he always eats a light breakfast, even just some fruits, but sometimes skips lunch.   Health  maintenance He requested flu shot today, which was updated today.   Patient Active Problem List   Diagnosis Date Noted  . Cough 03/17/2018  . Lower resp. tract infection 03/17/2018  . Sleep apnea with use of continuous positive airway pressure (CPAP) 01/04/2018  . Malnutrition of moderate degree 07/14/2017  . Thrush 07/11/2017  . FTT (failure to thrive) in adult 07/11/2017  . 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 CAD (coronary artery disease) 05/25/2017  . Unstable angina ([place])   . Hypertensive heart disease 07/14/2015  . Lower extremity edema 07/14/2015  . Aortic stenosis 01/09/2015  . OSA on CPAP 12/12/2014  . Hypersomnia with sleep apnea 12/12/2014  . Obstructive sleep apnea (adult) (pediatric) 02/04/2014  . 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 Obesity 02/04/2014  . Mitral regurgitation and aortic stenosis 01/02/2014  . DOE (dyspnea on exertion) 11/26/2013  . Gout 08/12/2011  . Hypertension 08/12/2011  . High cholesterol 08/12/2011  . OSA (obstructive sleep apnea) 08/12/2011  . Diabetes type 2, uncontrolled ([place]) 08/12/2011   Past Medical History:  Diagnosis Date  . Arthritis    "knees" (07/11/2017)  . CKD (chronic kidney disease), stage III ([place])   . Coronary artery disease    a.  CABGx3 in 05/2017.  Marland Kitchen Gout   . Heart murmur   . High cholesterol   . Hypertension   . Mild aortic stenosis   . Mild mitral regurgitation   .  OSA on CPAP   . Postoperative atrial fibrillation ([place]) 06/2017  . Sleep apnea   . Type II diabetes mellitus ([place])    Past Surgical History:  Procedure Laterality Date  . CARDIAC CATHETERIZATION    . COLONOSCOPY  2008  . CORONARY ARTERY BYPASS GRAFT N/A 06/24/2017   Procedure: CORONARY ARTERY BYPASS GRAFTING (CABG) x 3 ; Using Left Internal Mammary Artery and Right Great Saphenous Leg Vein Harvested Endoscopically;  Surgeon: Gaye Pollack, MD;  Location: [facility];  Service: Open Heart Surgery;  Laterality: N/A;  . EYE SURGERY    . LEFT HEART CATH AND  CORONARY ANGIOGRAPHY N/A 05/25/2017   Procedure: LEFT HEART CATH AND CORONARY ANGIOGRAPHY;  Surgeon: Wellington Hampshire, MD;  Location: [facility];  Service: Cardiovascular;  Laterality: N/A;  . TEE WITHOUT CARDIOVERSION N/A 06/24/2017   Procedure: TRANSESOPHAGEAL ECHOCARDIOGRAM (TEE);  Surgeon: Gaye Pollack, MD;  Location: [facility];  Service: Open Heart Surgery;  Laterality: N/A;   Allergies  Allergen Reactions  . Penicillins Other (See Comments)    Has patient had a PCN reaction causing immediate rash, facial/tongue/throat swelling, SOB or lightheadedness with hypotension: No Has patient had a PCN reaction causing severe rash involving mucus membranes or skin necrosis: No Has patient had a PCN reaction that required hospitalization: No Has patient had a PCN reaction occurring within the last 10 years: No If all of the above answers are "NO", then may proceed with Cephalosporin use.   Passed out ? SYNCOPE ?  Marland Kitchen Lisinopril Cough  . Amiodarone Nausea And Vomiting  . Oxycodone Other (See Comments)    hallucinations  . Zofran [Ondansetron Hcl]     Has an interaction with another medication pt takes   . Cortisone Nausea And Vomiting and Other (See Comments)    HICCUPS  . Tramadol Nausea And Vomiting and Other (See Comments)    HICCUPS   Prior to Admission medications   Medication Sig Start Date End Date Taking? Authorizing Provider  amLODipine (NORVASC) 10 MG tablet Take 1 tablet (10 mg total) by mouth daily. 12/29/17   [name], MD  amLODipine (NORVASC) 10 MG tablet TAKE 1 TABLET DAILY,       PATIENT NEEDS OFFICE VISIT FOR ADDITIONAL REFILLS 01/12/18   [name], MD  amLODipine (NORVASC) 10 MG tablet TAKE 1 TABLET DAILY,       PATIENT NEEDS OFFICE VISIT FOR ADDITIONAL REFILLS 05/19/18   [name], MD  aspirin EC 81 MG tablet Take 1 tablet (81 mg total) by mouth daily. 09/02/17   Dunn, Nedra Hai, PA-C  atorvastatin (LIPITOR) 80 MG tablet Take 1 tablet (80 mg  total) by mouth daily at 6 PM. 12/29/17   [name], MD  azithromycin [facility]) 250 MG tablet Sig as indicated 05/08/18   Horald Pollen, MD  benzonatate (TESSALON) 200 MG capsule Take 1 capsule (200 mg total) by mouth 2 (two) times daily as needed for cough. 05/08/18   Horald Pollen, MD  colchicine 0.6 MG tablet Take 1-2 tablets (0.6-1.2 mg total) by mouth daily as needed (for gout flare). 2 po at onset of flair and then repeat in 1 h for acute gout attack 12/30/17   [name], MD  doxycycline (VIBRA-TABS) 100 MG tablet Take 1 tablet (100 mg total) by mouth 2 (two) times daily. 03/17/18   Horald Pollen, MD  furosemide (LASIX) 20 MG tablet Take 1 tablet (20 mg total) by mouth  daily as needed for fluid or edema. 12/29/17   [name], MD  glipiZIDE (GLUCOTROL) 5 MG tablet TAKE 1 TABLET (5 MG TOTAL) BY MOUTH DAILY BEFORE BREAKFAST. 05/22/18   [name], MD  irbesartan (AVAPRO) 75 MG tablet Take 1 tablet (75 mg total) by mouth daily. 04/28/18   [name], MD  metFORMIN (GLUCOPHAGE) 500 MG tablet Take 1 tablet (500 mg total) by mouth 2 (two) times daily with a meal. 04/28/18   [name], MD  metoprolol tartrate (LOPRESSOR) 50 MG tablet Take 1 tablet (50 mg total) by mouth 2 (two) times daily. 12/29/17   [name], MD  promethazine-codeine [facility] WITH CODEINE) 6.25-10 MG/5ML syrup Take 5 mLs by mouth at bedtime as needed for cough. 05/08/18   Horald Pollen, MD   Social History   Socioeconomic History  . Marital status: Married    Spouse name: [name]  . Number of children: 3  . Years of education: 24  . Highest education level: Not on file  Occupational History  . Occupation: Musician  Social Needs  . Financial resource strain: Not on file  . Food insecurity:    Worry: Not on file    Inability: Not on file  . Transportation needs:    Medical: Not on file    Non-medical: Not on file  Tobacco Use  . Smoking  status: Former Smoker    Packs/day: 2.00    Years: 15.00    Pack years: 30.00    Types: Cigarettes    Last attempt to quit: 07/26/1998    Years since quitting: 19.8  . Smokeless tobacco: Never Used  Substance and Sexual Activity  . Alcohol use: No    Alcohol/week: 0.0 standard drinks  . Drug use: No  . Sexual activity: Yes  Lifestyle  . Physical activity:    Days per week: Not on file    Minutes per session: Not on file  . Stress: Not on file  Relationships  . Social connections:    Talks on phone: Not on file    Gets together: Not on file    Attends religious service: Not on file    Active member of club or organization: Not on file    Attends meetings of clubs or organizations: Not on file    Relationship status: Not on file  . Intimate partner violence:    Fear of current or ex partner: Not on file    Emotionally abused: Not on file    Physically abused: Not on file    Forced sexual activity: Not on file  Other Topics Concern  . Not on file  Social History Narrative   Patient is married [name]) and lives at home with his wife.   Patient has three adult children.   Patient is retired.   Patient is a Research officer, political party.   Patient is right-handed.   Patient drinks one cup of coffee daily and 2-3 sodas per day.      Review of Systems  Constitutional: Negative for fatigue and unexpected weight change.  HENT: Positive for congestion.   Eyes: Negative for visual disturbance.  Respiratory: Positive for cough. Negative for chest tightness and shortness of breath.   Cardiovascular: Negative for chest pain, palpitations and leg swelling.  Gastrointestinal: Negative for abdominal pain, Wells in stool, diarrhea, nausea and vomiting.  Neurological: Negative for dizziness, weakness, numbness and headaches.       Objective:   Physical Exam  Constitutional:  He is oriented to person, place, and time. He appears well-developed and well-nourished.  HENT:  Head: Normocephalic and  atraumatic.  Right Ear: Tympanic membrane, external ear and ear canal normal.  Left Ear: Tympanic membrane, external ear and ear canal normal.  Nose: No rhinorrhea.  Mouth/Throat: Oropharynx is clear and moist and mucous membranes are normal. No oropharyngeal exudate or posterior oropharyngeal erythema.  Eyes: Pupils are equal, round, and reactive to light. Conjunctivae and EOM are normal.  Neck: Neck supple. No JVD present. Carotid bruit is not present.  Cardiovascular: Normal rate, regular rhythm, normal heart sounds and intact distal pulses.  No murmur heard. Pulmonary/Chest: Effort normal and breath sounds normal. He has no wheezes. He has no rhonchi. He has no rales.  Abdominal: Soft. There is no tenderness.  Musculoskeletal: He exhibits no edema.  Lymphadenopathy:    He has no cervical adenopathy.  Neurological: He is alert and oriented to person, place, and time.  Skin: Skin is warm and dry. No rash noted.  Psychiatric: He has a normal mood and affect. His behavior is normal.  Vitals reviewed.   Vitals:   05/29/18 1355 05/29/18 1418  BP: (!) 149/74 126/60  Pulse: 65   Resp: 18   Temp: 98.4 F (36.9 C)   TempSrc: Oral   SpO2: 99%   Weight: 188 lb 6.4 oz (85.5 kg)   Height: 5\' 3"  (1.6 m)        Assessment & Plan:    Antonio Wells is a 72 y.o. male Essential hypertension - Plan: Basic metabolic panel  -  Stable, tolerating current regimen. Medications refilled. Labs pending as above.  Monitor home readings.  Hyperlipidemia, unspecified hyperlipidemia type - Plan: CANCELED: Lipid panel, CANCELED: Comprehensive metabolic panel  -Continue Lipitor 80 mg daily.  Cough Nasal congestion - Plan: ipratropium (ATROVENT) 0.06 % nasal spray  -Suspected postinfectious cough, expect that continue to improve.  Can try flonase or Atrovent nasal spray for possible postnasal drip contributing to cough.  RTC precautions if not continuing to improve.  Episode of dizziness - Plan:  CBC  -Check CBC to rule out anemia.  Maintain hydration.  Check Wells sugar if he is feeling lightheaded.  RTC precautions if measured hypoglycemia. stressed importance of eating if he is going to take glipizide.  No change in other med doses for now.    Meds ordered this encounter  Medications  . ipratropium (ATROVENT) 0.06 % nasal spray    Sig: Place 2 sprays into both nostrils 4 (four) times daily as needed for rhinitis.    Dispense:  15 mL    Refill:  1   Patient Instructions   Keep a record of your Wells pressures outside of the office and bring them to the next office visit.  Make sure you are drinking plenty of fluids as that can cause some lightheadedness with standing, check your Wells sugar anytime you feel the symptoms make sure it is not low.  If those symptoms continue, please return to recheck and to discuss other causes.  Ok to continue metformin twice per day, but do not take glipizide if you are not eating. Make sure to eat 3 meals per day, but watch portions to help with weight.  If you are having trouble with diet and weight, I would recommend meeting with a nutritionist.  Check Wells sugars once per day, and at anytime if you feel you are running low.   The cough should continue to improve if due  to recent illness.  You can try Flonase nasal spray over-the-counter 1 to 2 sprays per nostril once per day, or the prescription nasal spray to see if that helps.  Please follow-up if any worsening symptoms or cough does not continue to improve in the next 4 weeks.  Thank you for coming in today.    Cough, Adult Coughing is a reflex that clears your throat and your airways. Coughing helps to heal and protect your lungs. It is normal to cough occasionally, but a cough that happens with other symptoms or lasts a long time may be a sign of a condition that needs treatment. A cough may last only 2-3 weeks (acute), or it may last longer than 8 weeks (chronic). What are the  causes? Coughing is commonly caused by:  Breathing in substances that irritate your lungs.  A viral or bacterial respiratory infection.  Allergies.  Asthma.  Postnasal drip.  Smoking.  Acid backing up from the stomach into the esophagus (gastroesophageal reflux).  Certain medicines.  Chronic lung problems, including COPD (or rarely, lung cancer).  Other medical conditions such as heart failure.  Follow these instructions at home: Pay attention to any changes in your symptoms. Take these actions to help with your discomfort:  Take medicines only as told by your health care provider. ? If you were prescribed an antibiotic medicine, take it as told by your health care provider. Do not stop taking the antibiotic even if you start to feel better. ? Talk with your health care provider before you take a cough suppressant medicine.  Drink enough fluid to keep your urine clear or pale yellow.  If the air is dry, use a cold steam vaporizer or humidifier in your bedroom or your home to help loosen secretions.  Avoid anything that causes you to cough at work or at home.  If your cough is worse at night, try sleeping in a semi-upright position.  Avoid cigarette smoke. If you smoke, quit smoking. If you need help quitting, ask your health care provider.  Avoid caffeine.  Avoid alcohol.  Rest as needed.  Contact a health care provider if:  You have new symptoms.  You cough up pus.  Your cough does not get better after 2-3 weeks, or your cough gets worse.  You cannot control your cough with suppressant medicines and you are losing sleep.  You develop pain that is getting worse or pain that is not controlled with pain medicines.  You have a fever.  You have unexplained weight loss.  You have night sweats. Get help right away if:  You cough up Wells.  You have difficulty breathing.  Your heartbeat is very fast. This information is not intended to replace advice  given to you by your health care provider. Make sure you discuss any questions you have with your health care provider. Document Released: 01/08/2011 Document Revised: 12/18/2015 Document Reviewed: 09/18/2014 Elsevier Interactive Patient Education  [employer].   If you have lab work done today you will be contacted with your lab results within the next 2 weeks.  If you have not heard from [place] then please contact us. The fastest way to get your results is to register for My Chart.   IF you received an x-ray today, you will receive an invoice from [facility]. Please contact [facility] at [phone] with questions or concerns regarding your invoice.   IF you received labwork today, you will receive an invoice from [facility]. Please contact  [facility] at [phone] with questions or concerns regarding your invoice.   Our billing staff will not be able to assist you with questions regarding bills from these companies.  You will be contacted with the lab results as soon as they are available. The fastest way to get your results is to activate your My Chart account. Instructions are located on the last page of this paperwork. If you have not heard from [place] regarding the results in 2 weeks, please contact this office.      I personally performed the services described in this documentation, which was scribed in my presence. The recorded information has been reviewed and considered for accuracy and completeness, addended by me as needed, and agree with information above.  Signed,   [name], MD [facility] [place].  06/01/18 2:02 PM

## 2018-05-29 NOTE — Patient Instructions (Addendum)
Keep a record of your blood pressures outside of the office and bring them to the next office visit.  Make sure you are drinking plenty of fluids as that can cause some lightheadedness with standing, check your blood sugar anytime you feel the symptoms make sure it is not low.  If those symptoms continue, please return to recheck and to discuss other causes.  Ok to continue metformin twice per day, but do not take glipizide if you are not eating. Make sure to eat 3 meals per day, but watch portions to help with weight.  If you are having trouble with diet and weight, I would recommend meeting with a nutritionist.  Check blood sugars once per day, and at anytime if you feel you are running low.   The cough should continue to improve if due to recent illness.  You can try Flonase nasal spray over-the-counter 1 to 2 sprays per nostril once per day, or the prescription nasal spray to see if that helps.  Please follow-up if any worsening symptoms or cough does not continue to improve in the next 4 weeks.  Thank you for coming in today.    Cough, Adult Coughing is a reflex that clears your throat and your airways. Coughing helps to heal and protect your lungs. It is normal to cough occasionally, but a cough that happens with other symptoms or lasts a long time may be a sign of a condition that needs treatment. A cough may last only 2-3 weeks (acute), or it may last longer than 8 weeks (chronic). What are the causes? Coughing is commonly caused by:  Breathing in substances that irritate your lungs.  A viral or bacterial respiratory infection.  Allergies.  Asthma.  Postnasal drip.  Smoking.  Acid backing up from the stomach into the esophagus (gastroesophageal reflux).  Certain medicines.  Chronic lung problems, including COPD (or rarely, lung cancer).  Other medical conditions such as heart failure.  Follow these instructions at home: Pay attention to any changes in your symptoms. Take  these actions to help with your discomfort:  Take medicines only as told by your health care provider. ? If you were prescribed an antibiotic medicine, take it as told by your health care provider. Do not stop taking the antibiotic even if you start to feel better. ? Talk with your health care provider before you take a cough suppressant medicine.  Drink enough fluid to keep your urine clear or pale yellow.  If the air is dry, use a cold steam vaporizer or humidifier in your bedroom or your home to help loosen secretions.  Avoid anything that causes you to cough at work or at home.  If your cough is worse at night, try sleeping in a semi-upright position.  Avoid cigarette smoke. If you smoke, quit smoking. If you need help quitting, ask your health care provider.  Avoid caffeine.  Avoid alcohol.  Rest as needed.  Contact a health care provider if:  You have new symptoms.  You cough up pus.  Your cough does not get better after 2-3 weeks, or your cough gets worse.  You cannot control your cough with suppressant medicines and you are losing sleep.  You develop pain that is getting worse or pain that is not controlled with pain medicines.  You have a fever.  You have unexplained weight loss.  You have night sweats. Get help right away if:  You cough up blood.  You have difficulty breathing.  Your heartbeat  is very fast. This information is not intended to replace advice given to you by your health care provider. Make sure you discuss any questions you have with your health care provider. Document Released: 01/08/2011 Document Revised: 12/18/2015 Document Reviewed: 09/18/2014 Elsevier Interactive Patient Education  [employer].   If you have lab work done today you will be contacted with your lab results within the next 2 weeks.  If you have not heard from [place] then please contact us. The fastest way to get your results is to register for My Chart.   IF you  received an x-ray today, you will receive an invoice from [facility]. Please contact [facility] at [phone] with questions or concerns regarding your invoice.   IF you received labwork today, you will receive an invoice from [facility]. Please contact [facility] at [phone] with questions or concerns regarding your invoice.   Our billing staff will not be able to assist you with questions regarding bills from these companies.  You will be contacted with the lab results as soon as they are available. The fastest way to get your results is to activate your My Chart account. Instructions are located on the last page of this paperwork. If you have not heard from [place] regarding the results in 2 weeks, please contact this office.

## 2018-05-30 LAB — COMPREHENSIVE METABOLIC PANEL
A/G RATIO: 2.1 (ref 1.2–2.2)
ALK PHOS: 68 IU/L (ref 39–117)
ALT: 13 IU/L (ref 0–44)
AST: 14 IU/L (ref 0–40)
Albumin: 4.7 g/dL (ref 3.5–4.8)
BUN/Creatinine Ratio: 13 (ref 10–24)
BUN: 18 mg/dL (ref 8–27)
Bilirubin Total: 0.5 mg/dL (ref 0.0–1.2)
CHLORIDE: 106 mmol/L (ref 96–106)
CO2: 23 mmol/L (ref 20–29)
Calcium: 9.2 mg/dL (ref 8.6–10.2)
Creatinine, Ser: 1.34 mg/dL — ABNORMAL HIGH (ref 0.76–1.27)
GFR calc Af Amer: 61 mL/min/{1.73_m2} (ref >59)
GFR, EST NON AFRICAN AMERICAN: 53 mL/min/{1.73_m2} — AB (ref >59)
GLUCOSE: 154 mg/dL — AB (ref 65–99)
Globulin, Total: 2.2 g/dL (ref 1.5–4.5)
Potassium: 4.7 mmol/L (ref 3.5–5.2)
Sodium: 145 mmol/L — ABNORMAL HIGH (ref 134–144)
Total Protein: 6.9 g/dL (ref 6.0–8.5)

## 2018-05-30 LAB — BASIC METABOLIC PANEL
BUN/Creatinine Ratio: 14 (ref 10–24)
BUN: 18 mg/dL (ref 8–27)
CO2: 21 mmol/L (ref 20–29)
CREATININE: 1.31 mg/dL — AB (ref 0.76–1.27)
Calcium: 9.1 mg/dL (ref 8.6–10.2)
Chloride: 106 mmol/L (ref 96–106)
GFR calc non Af Amer: 54 mL/min/{1.73_m2} — ABNORMAL LOW (ref >59)
GFR, EST AFRICAN AMERICAN: 63 mL/min/{1.73_m2} (ref >59)
Glucose: 163 mg/dL — ABNORMAL HIGH (ref 65–99)
Potassium: 4.7 mmol/L (ref 3.5–5.2)
SODIUM: 146 mmol/L — AB (ref 134–144)

## 2018-05-30 LAB — LIPID PANEL
CHOLESTEROL TOTAL: 130 mg/dL (ref 100–199)
Chol/HDL Ratio: 2.2 ratio (ref 0.0–5.0)
HDL: 59 mg/dL (ref >39)
LDL CALC: 55 mg/dL (ref 0–99)
TRIGLYCERIDES: 79 mg/dL (ref 0–149)
VLDL CHOLESTEROL CAL: 16 mg/dL (ref 5–40)

## 2018-05-30 LAB — CBC
HEMATOCRIT: 41.3 % (ref 37.5–51.0)
Hemoglobin: 13.5 g/dL (ref 13.0–17.7)
MCH: 29.5 pg (ref 26.6–33.0)
MCHC: 32.7 g/dL (ref 31.5–35.7)
MCV: 90 fL (ref 79–97)
PLATELETS: 235 10*3/uL (ref 150–450)
RBC: 4.57 x10E6/uL (ref 4.14–5.80)
RDW: 14.3 % (ref 12.3–15.4)
WBC: 4.3 10*3/uL (ref 3.4–10.8)

## 2018-06-13 ENCOUNTER — Encounter: Payer: Self-pay | Admitting: Gastroenterology

## 2018-06-13 ENCOUNTER — Ambulatory Visit (AMBULATORY_SURGERY_CENTER): Payer: Medicare Other | Admitting: Gastroenterology

## 2018-06-13 VITALS — BP 114/69 | HR 67 | Temp 96.9°F | Resp 12 | Ht 63.0 in | Wt 188.0 lb

## 2018-06-13 DIAGNOSIS — D122 Benign neoplasm of ascending colon: Secondary | ICD-10-CM

## 2018-06-13 DIAGNOSIS — Z1211 Encounter for screening for malignant neoplasm of colon: Secondary | ICD-10-CM

## 2018-06-13 DIAGNOSIS — D123 Benign neoplasm of transverse colon: Secondary | ICD-10-CM | POA: Diagnosis not present

## 2018-06-13 DIAGNOSIS — D128 Benign neoplasm of rectum: Secondary | ICD-10-CM

## 2018-06-13 DIAGNOSIS — K640 First degree hemorrhoids: Secondary | ICD-10-CM

## 2018-06-13 DIAGNOSIS — D129 Benign neoplasm of anus and anal canal: Secondary | ICD-10-CM

## 2018-06-13 DIAGNOSIS — K5730 Diverticulosis of large intestine without perforation or abscess without bleeding: Secondary | ICD-10-CM

## 2018-06-13 DIAGNOSIS — D124 Benign neoplasm of descending colon: Secondary | ICD-10-CM | POA: Diagnosis not present

## 2018-06-13 MED ORDER — SODIUM CHLORIDE 0.9 % IV SOLN: 500.0000 mL | Freq: Once | INTRAVENOUS | Status: DC

## 2018-06-13 NOTE — Progress Notes (Signed)
Pt's states no medical or surgical changes since previsit or office visit. 

## 2018-06-13 NOTE — Patient Instructions (Addendum)
Resume Aspirin at prior dose in 5 days  A CLIP WAS PLACED AT A POLYP REMOVAL SITE IN THE TRANSVERSE COLON . A CARD WAS GIVEN TO YOU TO INDICATE THIS FOR YOU TO KEEP IN YOUR WALLET  Repeat Colonoscopy in 2 years   Resume previous diet and previous medications   INFORMATION ON POLYPS,DIVERTICULOSIS,& HEMORRHOIDS GIVEN TO YOU TODAY    YOU HAD AN ENDOSCOPIC PROCEDURE TODAY AT [place]:   Refer to the procedure report that was given to you for any specific questions about what was found during the examination.  If the procedure report does not answer your questions, please call your gastroenterologist to clarify.  If you requested that your care partner not be given the details of your procedure findings, then the procedure report has been included in a sealed envelope for you to review at your convenience later.  YOU SHOULD EXPECT: Some feelings of bloating in the abdomen. Passage of more gas than usual.  Walking can help get rid of the air that was put into your GI tract during the procedure and reduce the bloating. If you had a lower endoscopy (such as a colonoscopy or flexible sigmoidoscopy) you may notice spotting of blood in your stool or on the toilet paper. If you underwent a bowel prep for your procedure, you may not have a normal bowel movement for a few days.  Please Note:  You might notice some irritation and congestion in your nose or some drainage.  This is from the oxygen used during your procedure.  There is no need for concern and it should clear up in a day or so.  SYMPTOMS TO REPORT IMMEDIATELY:   Following lower endoscopy (colonoscopy or flexible sigmoidoscopy):  Excessive amounts of blood in the stool  Significant tenderness or worsening of abdominal pains  Swelling of the abdomen that is new, acute  Fever of 100F or higher    For urgent or emergent issues, a gastroenterologist can be reached at any hour by calling [phone].   DIET:  We  do recommend a small meal at first, but then you may proceed to your regular diet.  Drink plenty of fluids but you should avoid alcoholic beverages for 24 hours.  ACTIVITY:  You should plan to take it easy for the rest of today and you should NOT DRIVE or use heavy machinery until tomorrow (because of the sedation medicines used during the test).    FOLLOW UP: Our staff will call the number listed on your records the next business day following your procedure to check on you and address any questions or concerns that you may have regarding the information given to you following your procedure. If we do not reach you, we will leave a message.  However, if you are feeling well and you are not experiencing any problems, there is no need to return our call.  We will assume that you have returned to your regular daily activities without incident.  If any biopsies were taken you will be contacted by phone or by letter within the next 1-3 weeks.  Please call us at [phone] if you have not heard about the biopsies in 3 weeks.    SIGNATURES/CONFIDENTIALITY: You and/or your care partner have signed paperwork which will be entered into your electronic medical record.  These signatures attest to the fact that that the information above on your After Visit Summary has been reviewed and is understood.  Full responsibility of  the confidentiality of this discharge information lies with you and/or your care-partner. 

## 2018-06-13 NOTE — Progress Notes (Signed)
Spontaneous respirations throughout. VSS. Resting comfortably. To PACU on room air. Report to  RN. 

## 2018-06-13 NOTE — Progress Notes (Signed)
Called to room to assist during endoscopic procedure.  Patient ID and intended procedure confirmed with present staff. Received instructions for my participation in the procedure from the performing physician.  

## 2018-06-13 NOTE — Op Note (Addendum)
Antonio Wells Patient Name: Antonio Wells Procedure Date: 06/13/2018 8:49 AM MRN: [id number] Endoscopist: Gerrit Heck , MD Age: 72 Referring MD:  Date of Birth: 09-25-1945 Gender: Male Account #: [id number] Procedure:                Colonoscopy Indications:              Screening for colorectal malignant neoplasm (last                            colonoscopy was more than 10 years ago; 2008),                            Screening in patient at increased risk: Colorectal                            cancer in brother 78 or older, diagnosed earlier                            this year. Medicines:                Monitored Anesthesia Care Procedure:                Pre-Anesthesia Assessment:                           - Prior to the procedure, a History and Physical                            was performed, and patient medications and                            allergies were reviewed. The patient's tolerance of                            previous anesthesia was also reviewed. The risks                            and benefits of the procedure and the sedation                            options and risks were discussed with the patient.                            All questions were answered, and informed consent                            was obtained. Prior Anticoagulants: The patient has                            taken aspirin, last dose was 1 day prior to                            procedure. ASA Grade Assessment: III - A patient  with severe systemic disease. After reviewing the                            risks and benefits, the patient was deemed in                            satisfactory condition to undergo the procedure.                           After obtaining informed consent, the colonoscope                            was passed under direct vision. Throughout the                            procedure, the patient's blood pressure, pulse, and                             oxygen saturations were monitored continuously. The                            Model PCF-[id number] scope was introduced                            through the anus and advanced to the the cecum,                            identified by appendiceal orifice and ileocecal                            valve. The colonoscopy was performed without                            difficulty. The patient tolerated the procedure                            well. The quality of the bowel preparation was                            adequate. Scope In: 8:55:42 AM Scope Out: 9:33:22 AM Scope Withdrawal Time: 0 hours 35 minutes 1 second  Total Procedure Duration: 0 hours 37 minutes 40 seconds  Findings:                 The perianal and digital rectal examinations were                            normal.                           Five sessile polyps were found in the proximal                            transverse colon and hepatic flexure. The polyps  were 3 to 6 mm in size. These polyps were removed                            with a cold snare. Resection and retrieval were                            complete. There was bleeding at one of the                            polypectomy sites that was successfully treated                            with one hemostatic clip. There was no bleeding at                            the end of the procedure.                           Four sessile polyps were found in the rectum and                            descending colon. The polyps were 3 to 6 mm in                            size. These polyps were removed with a cold snare.                            Resection and retrieval were complete. Estimated                            blood loss was minimal.                           Multiple small and large-mouthed diverticula were                            found in the sigmoid colon and ascending colon.                            Non-bleeding internal hemorrhoids were found during                            retroflexion. The hemorrhoids were small. Complications:            No immediate complications. Estimated Blood Loss:     Estimated blood loss was minimal. Impression:               - Five 3 to 6 mm polyps in the proximal transverse                            colon and at the hepatic flexure, removed with a  cold snare. Resected and retrieved. Clip was placed.                           - Four 3 to 6 mm polyps in the rectum and in the                            descending colon, removed with a cold snare.                            Resected and retrieved.                           - Diverticulosis in the sigmoid colon and in the                            ascending colon.                           - Non-bleeding internal hemorrhoids. Recommendation:           - Patient has a contact number available for                            emergencies. The signs and symptoms of potential                            delayed complications were discussed with the                            patient. Return to normal activities tomorrow.                            Written discharge instructions were provided to the                            patient.                           - Resume previous diet today.                           - Continue present medications.                           - Await pathology results.                           - Repeat colonoscopy in 2 years for surveillance.                           - Return to GI clinic PRN.                           - Resume aspirin at prior dose in 5 days. Gerrit Heck, MD 06/13/2018 9:39:51 AM

## 2018-06-14 ENCOUNTER — Telehealth: Payer: Self-pay

## 2018-06-14 NOTE — Telephone Encounter (Signed)
  Follow up Call-  Call back number 06/13/2018  Post procedure Call Back phone  # [phone]  Permission to leave phone message Yes  Some recent data might be hidden     Patient questions:  Do you have a fever, pain , or abdominal swelling? No. Pain Score  0 *  Have you tolerated food without any problems? Yes.    Have you been able to return to your normal activities? Yes.    Do you have any questions about your discharge instructions: Diet   No. Medications  No. Follow up visit  No.  Do you have questions or concerns about your Care? No.  Actions: * If pain score is 4 or above: No action needed, pain <4.

## 2018-06-20 ENCOUNTER — Encounter: Payer: Self-pay | Admitting: Gastroenterology

## 2018-06-27 ENCOUNTER — Ambulatory Visit (HOSPITAL_COMMUNITY): Payer: Medicare Other | Attending: Cardiology

## 2018-06-27 ENCOUNTER — Other Ambulatory Visit: Payer: Medicare Other

## 2018-06-27 ENCOUNTER — Other Ambulatory Visit: Payer: Self-pay

## 2018-06-27 DIAGNOSIS — I2510 Atherosclerotic heart disease of native coronary artery without angina pectoris: Secondary | ICD-10-CM

## 2018-06-27 DIAGNOSIS — I10 Essential (primary) hypertension: Secondary | ICD-10-CM

## 2018-06-27 DIAGNOSIS — I5032 Chronic diastolic (congestive) heart failure: Secondary | ICD-10-CM

## 2018-06-27 DIAGNOSIS — Z951 Presence of aortocoronary bypass graft: Secondary | ICD-10-CM | POA: Diagnosis not present

## 2018-06-27 LAB — COMPREHENSIVE METABOLIC PANEL
ALT: 15 IU/L (ref 0–44)
AST: 19 IU/L (ref 0–40)
Albumin/Globulin Ratio: 2.7 — ABNORMAL HIGH (ref 1.2–2.2)
Albumin: 4.9 g/dL — ABNORMAL HIGH (ref 3.5–4.8)
Alkaline Phosphatase: 69 IU/L (ref 39–117)
BUN/Creatinine Ratio: 13 (ref 10–24)
BUN: 18 mg/dL (ref 8–27)
Bilirubin Total: 0.4 mg/dL (ref 0.0–1.2)
CO2: 23 mmol/L (ref 20–29)
Calcium: 9.4 mg/dL (ref 8.6–10.2)
Chloride: 104 mmol/L (ref 96–106)
Creatinine, Ser: 1.41 mg/dL — ABNORMAL HIGH (ref 0.76–1.27)
GFR calc Af Amer: 58 mL/min/{1.73_m2} — ABNORMAL LOW (ref >59)
GFR calc non Af Amer: 50 mL/min/{1.73_m2} — ABNORMAL LOW (ref >59)
Globulin, Total: 1.8 g/dL (ref 1.5–4.5)
Glucose: 106 mg/dL — ABNORMAL HIGH (ref 65–99)
Potassium: 4.3 mmol/L (ref 3.5–5.2)
Sodium: 144 mmol/L (ref 134–144)
Total Protein: 6.7 g/dL (ref 6.0–8.5)

## 2018-06-27 LAB — CBC WITH DIFFERENTIAL/PLATELET
Basophils Absolute: 0 10*3/uL (ref 0.0–0.2)
Basos: 0 %
EOS (ABSOLUTE): 0.1 10*3/uL (ref 0.0–0.4)
Eos: 2 %
Hematocrit: 37.7 % (ref 37.5–51.0)
Hemoglobin: 12.8 g/dL — ABNORMAL LOW (ref 13.0–17.7)
Immature Grans (Abs): 0 10*3/uL (ref 0.0–0.1)
Immature Granulocytes: 0 %
Lymphocytes Absolute: 2.5 10*3/uL (ref 0.7–3.1)
Lymphs: 57 %
MCH: 31.2 pg (ref 26.6–33.0)
MCHC: 34 g/dL (ref 31.5–35.7)
MCV: 92 fL (ref 79–97)
Monocytes Absolute: 0.3 10*3/uL (ref 0.1–0.9)
Monocytes: 7 %
Neutrophils Absolute: 1.5 10*3/uL (ref 1.4–7.0)
Neutrophils: 34 %
Platelets: 212 10*3/uL (ref 150–450)
RBC: 4.1 x10E6/uL — ABNORMAL LOW (ref 4.14–5.80)
RDW: 14.6 % (ref 12.3–15.4)
WBC: 4.5 10*3/uL (ref 3.4–10.8)

## 2018-06-27 LAB — LIPID PANEL
Chol/HDL Ratio: 2.1 ratio (ref 0.0–5.0)
Cholesterol, Total: 124 mg/dL (ref 100–199)
HDL: 58 mg/dL (ref >39)
LDL Calculated: 44 mg/dL (ref 0–99)
Triglycerides: 110 mg/dL (ref 0–149)
VLDL Cholesterol Cal: 22 mg/dL (ref 5–40)

## 2018-06-27 LAB — TSH: TSH: 4.12 u[IU]/mL (ref 0.450–4.500)

## 2018-06-28 ENCOUNTER — Telehealth: Payer: Self-pay

## 2018-06-28 NOTE — Telephone Encounter (Signed)
-----   Message from [name], MD sent at 06/28/2018 11:28 AM EST ----- Normal lipids and CBC, stable Crea, normal LFTs.

## 2018-06-28 NOTE — Telephone Encounter (Signed)
Notes recorded by Frederik Schmidt, RN on 06/28/2018 at 11:34 AM EST [email or web address] 12/4 ------

## 2018-06-28 NOTE — Telephone Encounter (Signed)
Notes recorded by Frederik Schmidt, RN on 06/28/2018 at 12:01 PM EST The patient has been notified of the result and verbalized understanding. All questions (if any) were answered. Frederik Schmidt, RN 06/28/2018 12:01 PM

## 2018-07-10 ENCOUNTER — Encounter: Payer: Self-pay | Admitting: Family Medicine

## 2018-07-10 ENCOUNTER — Other Ambulatory Visit: Payer: Self-pay

## 2018-07-10 ENCOUNTER — Ambulatory Visit (INDEPENDENT_AMBULATORY_CARE_PROVIDER_SITE_OTHER): Payer: Medicare Other | Admitting: Family Medicine

## 2018-07-10 VITALS — BP 142/70 | HR 61 | Temp 98.6°F | Ht 63.0 in | Wt 191.4 lb

## 2018-07-10 DIAGNOSIS — I10 Essential (primary) hypertension: Secondary | ICD-10-CM

## 2018-07-10 DIAGNOSIS — I2510 Atherosclerotic heart disease of native coronary artery without angina pectoris: Secondary | ICD-10-CM | POA: Diagnosis not present

## 2018-07-10 DIAGNOSIS — R42 Dizziness and giddiness: Secondary | ICD-10-CM

## 2018-07-10 DIAGNOSIS — E1122 Type 2 diabetes mellitus with diabetic chronic kidney disease: Secondary | ICD-10-CM

## 2018-07-10 DIAGNOSIS — R809 Proteinuria, unspecified: Secondary | ICD-10-CM

## 2018-07-10 NOTE — Patient Instructions (Addendum)
If any return of lower blood sugars - return to discuss changes. Have lab only visit for A1c in 1 month.  Schedule eye doctor exam if not seen within past year.   Try to avoid excess salt in the diet, make sure to stay hydrated.  No med changes for now, but if dizziness continues, or blood pressure is running higher than 140/90 consistently, please return to discuss dizziness further. You can also discuss dizziness with cardiology. Return to the clinic or go to the nearest emergency room if any of your symptoms worsen or new symptoms occur.  Call your surgeon for appointment to discuss concerns regarding incision. There may be some excess scar tissue in that area.   Keep appointment with Dr. Meda Coffee in January.   Dizziness Dizziness is a common problem. It is a feeling of unsteadiness or light-headedness. You may feel like you are about to faint. Dizziness can lead to injury if you stumble or fall. Anyone can become dizzy, but dizziness is more common in older adults. This condition can be caused by a number of things, including medicines, dehydration, or illness. Follow these instructions at home: Eating and drinking  Drink enough fluid to keep your urine clear or pale yellow. This helps to keep you from becoming dehydrated. Try to drink more clear fluids, such as water.  Do not drink alcohol.  Limit your caffeine intake if told to do so by your health care provider. Check ingredients and nutrition facts to see if a food or beverage contains caffeine.  Limit your salt (sodium) intake if told to do so by your health care provider. Check ingredients and nutrition facts to see if a food or beverage contains sodium. Activity  Avoid making quick movements. ? Rise slowly from chairs and steady yourself until you feel okay. ? In the morning, first sit up on the side of the bed. When you feel okay, stand slowly while you hold onto something until you know that your balance is fine.  If you need to  stand in one place for a long time, move your legs often. Tighten and relax the muscles in your legs while you are standing.  Do not drive or use heavy machinery if you feel dizzy.  Avoid bending down if you feel dizzy. Place items in your home so that they are easy for you to reach without leaning over. Lifestyle  Do not use any products that contain nicotine or tobacco, such as cigarettes and e-cigarettes. If you need help quitting, ask your health care provider.  Try to reduce your stress level by using methods such as yoga or meditation. Talk with your health care provider if you need help to manage your stress. General instructions  Watch your dizziness for any changes.  Take over-the-counter and prescription medicines only as told by your health care provider. Talk with your health care provider if you think that your dizziness is caused by a medicine that you are taking.  Tell a friend or a family member that you are feeling dizzy. If he or she notices any changes in your behavior, have this person call your health care provider.  Keep all follow-up visits as told by your health care provider. This is important. Contact a health care provider if:  Your dizziness does not go away.  Your dizziness or light-headedness gets worse.  You feel nauseous.  You have reduced hearing.  You have new symptoms.  You are unsteady on your feet or you feel  like the room is spinning. Get help right away if:  You vomit or have diarrhea and are unable to eat or drink anything.  You have problems talking, walking, swallowing, or using your arms, hands, or legs.  You feel generally weak.  You are not thinking clearly or you have trouble forming sentences. It may take a friend or family member to notice this.  You have chest pain, abdominal pain, shortness of breath, or sweating.  Your vision changes.  You have any bleeding.  You have a severe headache.  You have neck pain or a stiff  neck.  You have a fever. These symptoms may represent a serious problem that is an emergency. Do not wait to see if the symptoms will go away. Get medical help right away. Call your local emergency services (911 in the [place] Do not drive yourself to the hospital. Summary  Dizziness is a feeling of unsteadiness or light-headedness. This condition can be caused by a number of things, including medicines, dehydration, or illness.  Anyone can become dizzy, but dizziness is more common in older adults.  Drink enough fluid to keep your urine clear or pale yellow. Do not drink alcohol.  Avoid making quick movements if you feel dizzy. Monitor your dizziness for any changes. This information is not intended to replace advice given to you by your health care provider. Make sure you discuss any questions you have with your health care provider. Document Released: 01/05/2001 Document Revised: 08/14/2016 Document Reviewed: 08/14/2016 Elsevier Interactive Patient Education  [employer].   If you have lab work done today you will be contacted with your lab results within the next 2 weeks.  If you have not heard from [place] then please contact us. The fastest way to get your results is to register for My Chart.   IF you received an x-ray today, you will receive an invoice from [facility]. Please contact [facility] at [phone] with questions or concerns regarding your invoice.   IF you received labwork today, you will receive an invoice from [facility]. Please contact [facility] at [phone] with questions or concerns regarding your invoice.   Our billing staff will not be able to assist you with questions regarding bills from these companies.  You will be contacted with the lab results as soon as they are available. The fastest way to get your results is to activate your My Chart account. Instructions are located on the last page of this paperwork. If you have not heard from [place]  regarding the results in 2 weeks, please contact this office.

## 2018-07-10 NOTE — Progress Notes (Signed)
Subjective:    Patient ID: Antonio Wells, male    DOB: Dec 20, 1945, 72 y.o.   MRN: [id number]  HPI Antonio Wells is a 72 y.o. male Presents today for: Chief Complaint  Patient presents with  . Diabetes    f/u  . Hypertension    Diabetes:  Lab Results  Component Value Date   HGBA1C 7.5 (A) 04/28/2018   HGBA1C 7.3 (H) 12/30/2017   HGBA1C 7.0 (H) 09/27/2017   Lab Results  Component Value Date   MICROALBUR 39.6 08/25/2015   LDLCALC 44 06/27/2018   CREATININE 1.41 (H) 06/27/2018  Due for ophthalmology exam, Pneumovax.  Up-to-date on foot exam Refuses PNA vaccine.  Intolerant to high dose of metformin.  He was continued at 500 mg metformin twice daily at October visit, but glipizide 5 mg added - taking with breakfast.  Urine microalbumin June 7 elevated at 208 Chronic kidney disease with EGFR range 58- 65 recently. Home readings: some difficulty - monitor was not working, but had readings:  low 67 - felt a little symptoms- fatigue. No treatment. No other symptomatic lows.  71, 90, 118, 139, 108, 147, 140, 151.  Exercise: 5 days per week prior, decreased recently with the holidays, and some diet changes with holidays.   Wt Readings from Last 3 Encounters:  07/10/18 191 lb 6.4 oz (86.8 kg)  06/13/18 188 lb (85.3 kg)  05/29/18 188 lb 6.4 oz (85.5 kg)     Hypertension: BP Readings from Last 3 Encounters:  07/10/18 (!) 142/70  06/13/18 114/69  05/29/18 126/60   Lab Results  Component Value Date   CREATININE 1.41 (H) 06/27/2018  He was started on Avapro in October given microalbuminuria and chronic kidney disease with his diabetes.  Had a cough with ACE inhibitor. BP controlled last visit 126/60.  Noted episodic dizziness with positional changes at last visit in November.  Maintaining hydration discussed. Followed by cardiology with recent labs.  Home BP's: 024/09-735/32.  No new side effects with meds. Rare dizziness - about once per week. Fleeting symptoms -  resolves quickly.  Notes with standing up. Feels like drinking enough water.  Recent HGB ok at 12.8  Some prominence of scar on chest, sore at times. Plans to discuss with surgeon.  Patient Active Problem List   Diagnosis Date Noted  . Cough 03/17/2018  . Lower resp. tract infection 03/17/2018  . Sleep apnea with use of continuous positive airway pressure (CPAP) 01/04/2018  . Malnutrition of moderate degree 07/14/2017  . Thrush 07/11/2017  . FTT (failure to thrive) in adult 07/11/2017  . 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 CAD (coronary artery disease) 05/25/2017  . Unstable angina ([place])   . Hypertensive heart disease 07/14/2015  . Lower extremity edema 07/14/2015  . Aortic stenosis 01/09/2015  . OSA on CPAP 12/12/2014  . Hypersomnia with sleep apnea 12/12/2014  . Obstructive sleep apnea (adult) (pediatric) 02/04/2014  . 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 Obesity 02/04/2014  . Mitral regurgitation and aortic stenosis 01/02/2014  . DOE (dyspnea on exertion) 11/26/2013  . Gout 08/12/2011  . Hypertension 08/12/2011  . High cholesterol 08/12/2011  . OSA (obstructive sleep apnea) 08/12/2011  . Diabetes type 2, uncontrolled ([place]) 08/12/2011   Past Medical History:  Diagnosis Date  . Arthritis    "knees" (07/11/2017)  . CKD (chronic kidney disease), stage III ([place])   . Coronary artery disease    a.  CABGx3 in 05/2017.  Marland Kitchen Gout   . Heart murmur   .  High cholesterol   . Hypertension   . Mild aortic stenosis   . Mild mitral regurgitation   . OSA on CPAP   . Postoperative atrial fibrillation ([place]) 06/2017  . Sleep apnea   . Type II diabetes mellitus ([place])    Past Surgical History:  Procedure Laterality Date  . CARDIAC CATHETERIZATION    . COLONOSCOPY  2008  . CORONARY ARTERY BYPASS GRAFT N/A 06/24/2017   Procedure: CORONARY ARTERY BYPASS GRAFTING (CABG) x 3 ; Using Left Internal Mammary Artery and Right Great Saphenous Leg Vein Harvested Endoscopically;  Surgeon: Gaye Pollack, MD;  Location: [facility];  Service: Open Heart Surgery;  Laterality: N/A;  . EYE SURGERY    . LEFT HEART CATH AND CORONARY ANGIOGRAPHY N/A 05/25/2017   Procedure: LEFT HEART CATH AND CORONARY ANGIOGRAPHY;  Surgeon: Wellington Hampshire, MD;  Location: [facility];  Service: Cardiovascular;  Laterality: N/A;  . TEE WITHOUT CARDIOVERSION N/A 06/24/2017   Procedure: TRANSESOPHAGEAL ECHOCARDIOGRAM (TEE);  Surgeon: Gaye Pollack, MD;  Location: [facility];  Service: Open Heart Surgery;  Laterality: N/A;   Allergies  Allergen Reactions  . Penicillins Other (See Comments)    Has patient had a PCN reaction causing immediate rash, facial/tongue/throat swelling, SOB or lightheadedness with hypotension: No Has patient had a PCN reaction causing severe rash involving mucus membranes or skin necrosis: No Has patient had a PCN reaction that required hospitalization: No Has patient had a PCN reaction occurring within the last 10 years: No If all of the above answers are "NO", then may proceed with Cephalosporin use.   Passed out ? SYNCOPE ?  Marland Kitchen Lisinopril Cough  . Amiodarone Nausea And Vomiting  . Oxycodone Other (See Comments)    hallucinations  . Zofran [Ondansetron Hcl]     Has an interaction with another medication pt takes   . Cortisone Nausea And Vomiting and Other (See Comments)    HICCUPS  . Tramadol Nausea And Vomiting and Other (See Comments)    HICCUPS   Prior to Admission medications   Medication Sig Start Date End Date Taking? Authorizing Provider  amLODipine (NORVASC) 10 MG tablet TAKE 1 TABLET DAILY,       PATIENT NEEDS OFFICE VISIT FOR ADDITIONAL REFILLS 05/19/18  Yes [name], MD  aspirin EC 81 MG tablet Take 1 tablet (81 mg total) by mouth daily. 09/02/17  Yes Dunn, Dayna N, PA-C  atorvastatin (LIPITOR) 80 MG tablet Take 1 tablet (80 mg total) by mouth daily at 6 PM. 12/29/17  Yes [name], MD  colchicine 0.6 MG tablet Take 1-2 tablets (0.6-1.2 mg total) by mouth  daily as needed (for gout flare). 2 po at onset of flair and then repeat in 1 h for acute gout attack 12/30/17  Yes [name], MD  furosemide (LASIX) 20 MG tablet Take 1 tablet (20 mg total) by mouth daily as needed for fluid or edema. 12/29/17  Yes [name], MD  glipiZIDE (GLUCOTROL) 5 MG tablet TAKE 1 TABLET (5 MG TOTAL) BY MOUTH DAILY BEFORE BREAKFAST. 05/22/18  Yes [name], MD  irbesartan (AVAPRO) 75 MG tablet Take 1 tablet (75 mg total) by mouth daily. 04/28/18  Yes [name], MD  metFORMIN (GLUCOPHAGE) 500 MG tablet Take 1 tablet (500 mg total) by mouth 2 (two) times daily with a meal. 04/28/18  Yes [name], MD  metoprolol tartrate (LOPRESSOR) 50 MG tablet Take 1 tablet (50 mg total)  by mouth 2 (two) times daily. 12/29/17  Yes [name], MD  promethazine-codeine [facility] WITH CODEINE) 6.25-10 MG/5ML syrup Take 5 mLs by mouth at bedtime as needed for cough. 05/08/18  Yes SagardiaInes Bloomer, MD   Social History   Socioeconomic History  . Marital status: Married    Spouse name: [name]  . Number of children: 3  . Years of education: 64  . Highest education level: Not on file  Occupational History  . Occupation: Musician  Social Needs  . Financial resource strain: Not on file  . Food insecurity:    Worry: Not on file    Inability: Not on file  . Transportation needs:    Medical: Not on file    Non-medical: Not on file  Tobacco Use  . Smoking status: Former Smoker    Packs/day: 2.00    Years: 15.00    Pack years: 30.00    Types: Cigarettes    Last attempt to quit: 07/26/1998    Years since quitting: 19.9  . Smokeless tobacco: Never Used  Substance and Sexual Activity  . Alcohol use: No    Alcohol/week: 0.0 standard drinks  . Drug use: No  . Sexual activity: Yes  Lifestyle  . Physical activity:    Days per week: Not on file    Minutes per session: Not on file  . Stress: Not on file  Relationships  . Social connections:     Talks on phone: Not on file    Gets together: Not on file    Attends religious service: Not on file    Active member of club or organization: Not on file    Attends meetings of clubs or organizations: Not on file    Relationship status: Not on file  . Intimate partner violence:    Fear of current or ex partner: Not on file    Emotionally abused: Not on file    Physically abused: Not on file    Forced sexual activity: Not on file  Other Topics Concern  . Not on file  Social History Narrative   Patient is married [name]) and lives at home with his wife.   Patient has three adult children.   Patient is retired.   Patient is a Research officer, political party.   Patient is right-handed.   Patient drinks one cup of coffee daily and 2-3 sodas per day.       Review of Systems  Constitutional: Negative for fatigue and unexpected weight change.  Eyes: Negative for visual disturbance.  Respiratory: Negative for cough, chest tightness and shortness of breath.   Cardiovascular: Negative for chest pain, palpitations and leg swelling.  Neurological: Positive for dizziness (rare as in HPI. ). Negative for light-headedness and headaches.       Objective:   Physical Exam Vitals signs reviewed.  Constitutional:      Appearance: He is well-developed.  HENT:     Head: Normocephalic and atraumatic.  Eyes:     Pupils: Pupils are equal, round, and reactive to light.  Neck:     Vascular: No carotid bruit or JVD.  Cardiovascular:     Rate and Rhythm: Normal rate and regular rhythm.     Heart sounds: Normal heart sounds. No murmur.  Pulmonary:     Effort: Pulmonary effort is normal.     Breath sounds: Normal breath sounds. No rales.  Skin:    General: Skin is warm and dry.       Neurological:  Mental Status: He is alert and oriented to person, place, and time.    Vitals:   07/10/18 0906 07/10/18 0910  BP: (!) 144/73 (!) 142/70  Pulse: 61   Temp: 98.6 F (37 C)   TempSrc: Oral   SpO2: 99%     Weight: 191 lb 6.4 oz (86.8 kg)   Height: 5' 3"  (1.6 m)           Assessment & Plan:  Antonio Wells is a 72 y.o. male Type 2 diabetes mellitus with chronic kidney disease, without long-term current use of insulin, unspecified CKD stage ([place]) - Plan: Hemoglobin A1c Microalbuminuria  -A1c at lab only visit 1 month.  No med changes for now.  Variations in home readings could be related to diet.  Stressed importance of diet adherence, option of meeting with a nutritionist.   -Depending on next A1c, could look at other med class options, especially at first of the year as may be more cost effective.  Essential hypertension Dizziness  -Borderline in office today, some elevated readings at home, but overall looked okay.  Prior readings looked okay.   - Continue same regimen for now.   -Maintain hydration, sit up/stand up slowly from seated position to lessen risk of dizziness, but RTC precautions given if more frequent dizziness.  -  Also can discuss with cardiology at upcoming visit.  Plans to discuss scar with his thoracic surgeon.  No signs of infection at present, RTC precautions given.  May have slight keloid or prominence of scar tissue.    No orders of the defined types were placed in this encounter.  Patient Instructions   If any return of lower blood sugars - return to discuss changes. Have lab only visit for A1c in 1 month.  Schedule eye doctor exam if not seen within past year.   Try to avoid excess salt in the diet, make sure to stay hydrated.  No med changes for now, but if dizziness continues, or blood pressure is running higher than 140/90 consistently, please return to discuss dizziness further. You can also discuss dizziness with cardiology. Return to the clinic or go to the nearest emergency room if any of your symptoms worsen or new symptoms occur.  Call your surgeon for appointment to discuss concerns regarding incision. There may be some excess scar tissue in that  area.   Keep appointment with Dr. Meda Coffee in January.   Dizziness Dizziness is a common problem. It is a feeling of unsteadiness or light-headedness. You may feel like you are about to faint. Dizziness can lead to injury if you stumble or fall. Anyone can become dizzy, but dizziness is more common in older adults. This condition can be caused by a number of things, including medicines, dehydration, or illness. Follow these instructions at home: Eating and drinking  Drink enough fluid to keep your urine clear or pale yellow. This helps to keep you from becoming dehydrated. Try to drink more clear fluids, such as water.  Do not drink alcohol.  Limit your caffeine intake if told to do so by your health care provider. Check ingredients and nutrition facts to see if a food or beverage contains caffeine.  Limit your salt (sodium) intake if told to do so by your health care provider. Check ingredients and nutrition facts to see if a food or beverage contains sodium. Activity  Avoid making quick movements. ? Rise slowly from chairs and steady yourself until you feel okay. ? In the morning,  first sit up on the side of the bed. When you feel okay, stand slowly while you hold onto something until you know that your balance is fine.  If you need to stand in one place for a long time, move your legs often. Tighten and relax the muscles in your legs while you are standing.  Do not drive or use heavy machinery if you feel dizzy.  Avoid bending down if you feel dizzy. Place items in your home so that they are easy for you to reach without leaning over. Lifestyle  Do not use any products that contain nicotine or tobacco, such as cigarettes and e-cigarettes. If you need help quitting, ask your health care provider.  Try to reduce your stress level by using methods such as yoga or meditation. Talk with your health care provider if you need help to manage your stress. General instructions  Watch your  dizziness for any changes.  Take over-the-counter and prescription medicines only as told by your health care provider. Talk with your health care provider if you think that your dizziness is caused by a medicine that you are taking.  Tell a friend or a family member that you are feeling dizzy. If he or she notices any changes in your behavior, have this person call your health care provider.  Keep all follow-up visits as told by your health care provider. This is important. Contact a health care provider if:  Your dizziness does not go away.  Your dizziness or light-headedness gets worse.  You feel nauseous.  You have reduced hearing.  You have new symptoms.  You are unsteady on your feet or you feel like the room is spinning. Get help right away if:  You vomit or have diarrhea and are unable to eat or drink anything.  You have problems talking, walking, swallowing, or using your arms, hands, or legs.  You feel generally weak.  You are not thinking clearly or you have trouble forming sentences. It may take a friend or family member to notice this.  You have chest pain, abdominal pain, shortness of breath, or sweating.  Your vision changes.  You have any bleeding.  You have a severe headache.  You have neck pain or a stiff neck.  You have a fever. These symptoms may represent a serious problem that is an emergency. Do not wait to see if the symptoms will go away. Get medical help right away. Call your local emergency services (911 in the [place] Do not drive yourself to the hospital. Summary  Dizziness is a feeling of unsteadiness or light-headedness. This condition can be caused by a number of things, including medicines, dehydration, or illness.  Anyone can become dizzy, but dizziness is more common in older adults.  Drink enough fluid to keep your urine clear or pale yellow. Do not drink alcohol.  Avoid making quick movements if you feel dizzy. Monitor your  dizziness for any changes. This information is not intended to replace advice given to you by your health care provider. Make sure you discuss any questions you have with your health care provider. Document Released: 01/05/2001 Document Revised: 08/14/2016 Document Reviewed: 08/14/2016 Elsevier Interactive Patient Education  [employer].   If you have lab work done today you will be contacted with your lab results within the next 2 weeks.  If you have not heard from [place] then please contact us. The fastest way to get your results is to register for My Chart.  IF you received an x-ray today, you will receive an invoice from [facility]. Please contact [facility] at [phone] with questions or concerns regarding your invoice.   IF you received labwork today, you will receive an invoice from [facility]. Please contact [facility] at [phone] with questions or concerns regarding your invoice.   Our billing staff will not be able to assist you with questions regarding bills from these companies.  You will be contacted with the lab results as soon as they are available. The fastest way to get your results is to activate your My Chart account. Instructions are located on the last page of this paperwork. If you have not heard from [place] regarding the results in 2 weeks, please contact this office.       Signed,   [name], MD [facility].  07/10/18 10:14 AM

## 2018-07-12 ENCOUNTER — Other Ambulatory Visit: Payer: Self-pay | Admitting: Family Medicine

## 2018-07-12 DIAGNOSIS — I10 Essential (primary) hypertension: Secondary | ICD-10-CM

## 2018-07-22 ENCOUNTER — Other Ambulatory Visit: Payer: Self-pay | Admitting: Cardiology

## 2018-08-02 ENCOUNTER — Ambulatory Visit: Payer: Medicare Other | Admitting: Cardiology

## 2018-08-02 ENCOUNTER — Encounter: Payer: Self-pay | Admitting: Cardiology

## 2018-08-02 VITALS — BP 138/82 | HR 63 | Ht 63.0 in | Wt 191.2 lb

## 2018-08-02 DIAGNOSIS — E785 Hyperlipidemia, unspecified: Secondary | ICD-10-CM | POA: Diagnosis not present

## 2018-08-02 DIAGNOSIS — I2510 Atherosclerotic heart disease of native coronary artery without angina pectoris: Secondary | ICD-10-CM | POA: Diagnosis not present

## 2018-08-02 DIAGNOSIS — I350 Nonrheumatic aortic (valve) stenosis: Secondary | ICD-10-CM | POA: Diagnosis not present

## 2018-08-02 DIAGNOSIS — I10 Essential (primary) hypertension: Secondary | ICD-10-CM | POA: Diagnosis not present

## 2018-08-02 NOTE — Progress Notes (Signed)
Cardiology Office Note    Date:  08/02/2018  ID:  [name], DOB [birth year], MRN [id number] PCP:  Antonio Agreste, MD  Cardiologist:  Dr. Meda Coffee   Chief Complaint: f/u CABG  History of Present Illness:  Antonio Wells is a 73 y.o. male with history of CAD with unstable angina s/p recent CABGx3 in 05/2017 with post-op atrial fib, HTN, HLD, DM, mild AS/MR, CKD stage III, sleep apnea who presents for post-op follow-up. He was evaluated for unstable angina in October 2018 with abnormal cardiac CT. Cardiac cath showed obstructive 2V disease. Pre-op echo showed mild LVH, EF 60-65%, grade 1 DD, mild AS/MR. He returned for outpatient CABG and underwent CABGx3 with LIMA-LAD and sequential SVG to OM1/OM3. He did have post-op atrial fib treated with amiodarone. At post-op follow-up with primary care he reported persistent nausea so amiodarone was stopped after discussion with our office. He was unfortunately subsequently admitted 12/17-12/20 with persistent failure to thrive, weight loss, thrush and dehydration. He had evidence of AKI and was treated with IV fluids for hypernatremia. 30-day event monitor was recommended given d/c of amiodarone which was negative for recurrent atrial fib. F/u labs by primary care 07/30/17 showed Hgb 12.4, K 4.7, Cr 1.38 (back near baseline). Otherwise recent LFTs were normal and LDL was 81 in 04/2017.  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  He has no chest pain shortness of breath he has mild intermittent lower extremity edema when he stands a lot and mild tenderness around sternotomy scar otherwise no other complaints no palpitations fever chills no cough and he tolerates all of his medications well.  Past Medical History:  Diagnosis Date  . Arthritis    "knees" (07/11/2017)  . CKD (chronic kidney disease), stage III ([place])   . Coronary  artery disease    a.  CABGx3 in 05/2017.  Marland Kitchen Gout   . Heart murmur   . High cholesterol   . Hypertension   . Mild aortic stenosis   . Mild mitral regurgitation   . OSA on CPAP   . Postoperative atrial fibrillation ([place]) 06/2017  . Sleep apnea   . Type II diabetes mellitus ([place])    Past Surgical History:  Procedure Laterality Date  . CARDIAC CATHETERIZATION    . COLONOSCOPY  2008  . CORONARY ARTERY BYPASS GRAFT N/A 06/24/2017   Procedure: CORONARY ARTERY BYPASS GRAFTING (CABG) x 3 ; Using Left Internal Mammary Artery and Right Great Saphenous Leg Vein Harvested Endoscopically;  Surgeon: Gaye Pollack, MD;  Location: [facility];  Service: Open Heart Surgery;  Laterality: N/A;  . EYE SURGERY    . LEFT HEART CATH AND CORONARY ANGIOGRAPHY N/A 05/25/2017   Procedure: LEFT HEART CATH AND CORONARY ANGIOGRAPHY;  Surgeon: Wellington Hampshire, MD;  Location: [facility];  Service: Cardiovascular;  Laterality: N/A;  . TEE WITHOUT CARDIOVERSION N/A 06/24/2017   Procedure: TRANSESOPHAGEAL ECHOCARDIOGRAM (TEE);  Surgeon: Gaye Pollack, MD;  Location: [facility];  Service: Open Heart Surgery;  Laterality: N/A;    Current Medications: Current Meds  Medication Sig  . amLODipine (NORVASC) 10 MG tablet TAKE 1 TABLET DAILY  . aspirin EC 81 MG tablet Take 1 tablet (81 mg total) by mouth daily.  Marland Kitchen atorvastatin (LIPITOR) 80 MG tablet Take 1 tablet (80 mg total) by mouth daily at 6 PM.  .  colchicine 0.6 MG tablet Take 1-2 tablets (0.6-1.2 mg total) by mouth daily as needed (for gout flare). 2 po at onset of flair and then repeat in 1 h for acute gout attack  . furosemide (LASIX) 20 MG tablet Take 1 tablet (20 mg total) by mouth daily as needed for fluid or edema.  Marland Kitchen glipiZIDE (GLUCOTROL) 5 MG tablet TAKE 1 TABLET (5 MG TOTAL) BY MOUTH DAILY BEFORE BREAKFAST.  Marland Kitchen irbesartan (AVAPRO) 75 MG tablet TAKE 1 TABLET BY MOUTH EVERY DAY  . metFORMIN (GLUCOPHAGE) 500 MG tablet Take 1 tablet (500 mg total) by mouth 2  (two) times daily with a meal.  . metoprolol tartrate (LOPRESSOR) 50 MG tablet Take 1 tablet (50 mg total) by mouth 2 (two) times daily.  . promethazine-codeine (PHENERGAN WITH CODEINE) 6.25-10 MG/5ML syrup Take 5 mLs by mouth at bedtime as needed for cough.  Allergies:   Penicillins; Lisinopril; Amiodarone; Oxycodone; Zofran [ondansetron hcl]; Cortisone; and Tramadol   Social History   Socioeconomic History  . Marital status: Married    Spouse name: [name]  . Number of children: 3  . Years of education: 22  . Highest education level: Not on file  Occupational History  . Occupation: Musician  Social Needs  . Financial resource strain: Not on file  . Food insecurity:    Worry: Not on file    Inability: Not on file  . Transportation needs:    Medical: Not on file    Non-medical: Not on file  Tobacco Use  . Smoking status: Former Smoker    Packs/day: 2.00    Years: 15.00    Pack years: 30.00    Types: Cigarettes    Last attempt to quit: 07/26/1998    Years since quitting: 20.0  . Smokeless tobacco: Never Used  Substance and Sexual Activity  . Alcohol use: No    Alcohol/week: 0.0 standard drinks  . Drug use: No  . Sexual activity: Yes  Lifestyle  . Physical activity:    Days per week: Not on file    Minutes per session: Not on file  . Stress: Not on file  Relationships  . Social connections:    Talks on phone: Not on file    Gets together: Not on file    Attends religious service: Not on file    Active member of club or organization: Not on file    Attends meetings of clubs or organizations: Not on file    Relationship status: Not on file  Other Topics Concern  . Not on file  Social History Narrative   Patient is married [name]) and lives at home with his wife.   Patient has three adult children.   Patient is retired.   Patient is a Research officer, political party.   Patient is right-handed.   Patient drinks one cup of coffee daily and 2-3 sodas per day.        Family History:    Family History  Problem Relation Age of Onset  . Hypertension Mother   . Kidney disease Mother   . Diabetes Mother   . Hypertension Father   . Cancer Brother        lung  . Colon cancer Brother   . Stroke Brother   . Esophageal cancer Neg Hx   . Rectal cancer Neg Hx   . Stomach cancer Neg Hx      ROS:   Please see the history of present illness.  Does note some sensitivity at sternal  scar but no dehiscence erythema or suppuration All other systems are reviewed and otherwise negative.    PHYSICAL EXAM:   VS:  BP 138/82   Pulse 63   Ht 5\' 3"  (1.6 m)   Wt 191 lb 3.2 oz (86.7 kg)   SpO2 98%   BMI 33.87 kg/m   BMI: Body mass index is 33.87 kg/m. GEN: Well nourished, well developed AAM in no acute distress  HEENT: normocephalic, atraumatic Neck: no JVD, carotid bruits, or masses Cardiac: RRR; very soft SEM RUSB, no rubs or gallops, mild B/L LE edema  Respiratory:  clear to auscultation bilaterally, normal work of breathing GI: soft, nontender, nondistended, + BS MS: no deformity or atrophy  Skin: warm and dry, no rash. Well healing sternal scar and endoscopic sites without dehiscence, suppuration or erythema Neuro:  Alert and Oriented x 3, Strength and sensation are intact, follows commands Psych: euthymic mood, full affect  Wt Readings from Last 3 Encounters:  08/02/18 191 lb 3.2 oz (86.7 kg)  07/10/18 191 lb 6.4 oz (86.8 kg)  06/13/18 188 lb (85.3 kg)    Studies/Labs Reviewed:   EKG:  EKG was ordered today and personally reviewed by me and demonstrates NSR 81bpm, left atrial enlargement, nonspecific TW changes I, avL, left axis deviation  Recent Labs: 06/27/2018: ALT 15; BUN 18; Creatinine, Ser 1.41; Hemoglobin 12.8; Platelets 212; Potassium 4.3; Sodium 144; TSH 4.120   Echo: 06/27/2018 - Left ventricle: The cavity size was normal. There was mild   concentric hypertrophy. Systolic function was normal. The   estimated ejection fraction was in the range of 60% to 65%.  Wall   motion was normal; there were no regional wall motion   abnormalities. There was an increased relative contribution of   atrial contraction to ventricular filling. Doppler parameters are   consistent with abnormal left ventricular relaxation (grade 1   diastolic dysfunction). - Aortic valve: Severely calcified annulus. Trileaflet; moderately   thickened, moderately calcified leaflets. There was mild   stenosis. Mean gradient (S): 14 mm Hg. - Mitral valve: Calcified annulus. Mild diffuse thickening and   calcification. There was mild regurgitation. - Left atrium: The atrium was mildly dilated. - Pulmonic valve: There was mild regurgitation.  Lipid Panel    Component Value Date/Time   CHOL 124 06/27/2018 0745   TRIG 110 06/27/2018 0745   HDL 58 06/27/2018 0745   CHOLHDL 2.1 06/27/2018 0745   CHOLHDL 2.8 02/24/2016 0749   VLDL 24 02/24/2016 0749   LDLCALC 44 06/27/2018 0745   Additional studies/ records that were reviewed today include: Summarized above.    ASSESSMENT & PLAN:   1. CAD - doing well post-operatively.  He is asymptomatic, goes to gym 5 times a week, tolerates all of his medications.   2. Paroxysmal atrial fib -no recurrent episodes.  On aspirin only.  3. Acute on chronic diastolic CHF, Lasix just as needed, he is also advised to use compression socks.  His creatinine was slightly elevated 1.4 his advised to avoid NSAIDs and hydrate well when he goes to the gym.   4. Essential HTN -blood pressure is controlled. 5. Hyperlipidemia -lipids are at goal, Lipitor is well-tolerated. 6.   Mild aortic stenosis and mitral regurgitation -asymptomatic and stable on echo in December 2019, we will follow with an echo in 3 years unless his murmur or symptoms change.  Disposition: F/u with Dr. Meda Coffee in 1 year, check labs a prior to that appointment.  Medication Adjustments/Labs and Tests  Ordered: Current medicines are reviewed at length with the patient today.  Concerns  regarding medicines are outlined above. Medication changes, Labs and Tests ordered today are summarized above and listed in the Patient Instructions accessible in Encounters.   Signed, Ena Dawley, MD  08/02/2018 8:50 AM    [address], [place], [place]  [address] Phone: [phone]; Fax: [phone]

## 2018-08-02 NOTE — Patient Instructions (Signed)
Medication Instructions:   Your physician recommends that you continue on your current medications as directed. Please refer to the Current Medication list given to you today.  If you need a refill on your cardiac medications before your next appointment, please call your pharmacy.      Lab work:  PRIOR TO YOUR ONE YEAR FOLLOW-UP APPOINTMENT WITH DR NELSON TO CHECK--CMET, CBC W DIFF, TSH, AND LIPIDS--PLEASE COME FASTING TO THIS LAB APPOINTMENT--CHECKOUT PLEASE SCHEDULE THIS LAB APPOINTMENT TODAY PER DR NELSON   If you have labs (blood work) drawn today and your tests are completely normal, you will receive your results only by: Marland Kitchen MyChart Message (if you have MyChart) OR . A paper copy in the mail If you have any lab test that is abnormal or we need to change your treatment, we will call you to review the results.      Follow-Up: At [facility], you and your health needs are our priority.  As part of our continuing mission to provide you with exceptional heart care, we have created designated Provider Care Teams.  These Care Teams include your primary Cardiologist (physician) and Advanced Practice Providers (APPs -  Physician Assistants and Nurse Practitioners) who all work together to provide you with the care you need, when you need it. You will need a follow up appointment in 12 months with Dr Meda Coffee.  Please call our office 2 months in advance to schedule this appointment.  You may see No primary care provider on file. or one of the following Advanced Practice Providers on your designated Care Team:   Lyda Jester, PA-C Melina Copa, PA-C . Ermalinda Barrios, PA-C    CHECKOUT PLEASE GO AHEAD AND SCHEDULE THE PATIENTS ONE YEAR LAB APPOINTMENT TODAY, PER DR Meda Coffee

## 2018-08-07 ENCOUNTER — Ambulatory Visit (INDEPENDENT_AMBULATORY_CARE_PROVIDER_SITE_OTHER): Payer: Medicare Other | Admitting: Family Medicine

## 2018-08-07 DIAGNOSIS — E1122 Type 2 diabetes mellitus with diabetic chronic kidney disease: Secondary | ICD-10-CM | POA: Diagnosis not present

## 2018-08-08 LAB — HEMOGLOBIN A1C
ESTIMATED AVERAGE GLUCOSE: 140 mg/dL
Hgb A1c MFr Bld: 6.5 % — ABNORMAL HIGH (ref 4.8–5.6)

## 2018-08-08 NOTE — Progress Notes (Signed)
Lab only visit 

## 2018-08-14 ENCOUNTER — Other Ambulatory Visit: Payer: Self-pay | Admitting: Family Medicine

## 2018-08-14 DIAGNOSIS — I2510 Atherosclerotic heart disease of native coronary artery without angina pectoris: Secondary | ICD-10-CM

## 2018-08-14 DIAGNOSIS — I10 Essential (primary) hypertension: Secondary | ICD-10-CM

## 2018-08-14 DIAGNOSIS — E1122 Type 2 diabetes mellitus with diabetic chronic kidney disease: Secondary | ICD-10-CM

## 2018-08-14 MED ORDER — METFORMIN HCL 500 MG PO TABS: 500.0000 mg | ORAL_TABLET | Freq: Two times a day (BID) | ORAL | 1 refills | Status: DC

## 2018-08-14 MED ORDER — IRBESARTAN 75 MG PO TABS: 75.0000 mg | ORAL_TABLET | Freq: Every day | ORAL | 1 refills | Status: DC

## 2018-08-14 MED ORDER — GLIPIZIDE 5 MG PO TABS: 5.0000 mg | ORAL_TABLET | Freq: Every day | ORAL | 1 refills | Status: DC

## 2018-08-14 NOTE — Telephone Encounter (Signed)
Requested medication (s) are due for refill today: Yes  Requested medication (s) are on the active medication list: Yes  Last refill:  By another provider  Future visit scheduled: Yes  Notes to clinic:  Switching mail order pharmacies, would like Dr. Carlota Raspberry to refill    Requested Prescriptions  Pending Prescriptions Disp Refills   amLODipine (NORVASC) 10 MG tablet 90 tablet 1    Sig: Take 1 tablet (10 mg total) by mouth daily.     Cardiovascular:  Calcium Channel Blockers Passed - 08/14/2018  5:35 PM      Passed - Last BP in normal range    BP Readings from Last 1 Encounters:  08/02/18 138/82         Passed - Valid encounter within last 6 months    Recent Outpatient Visits          1 week ago Type 2 diabetes mellitus with chronic kidney disease, without long-term current use of insulin, unspecified CKD stage [facility])   Primary [facility] at [facility], Ranell Patrick, MD   1 month ago Type 2 diabetes mellitus with chronic kidney disease, without long-term current use of insulin, unspecified CKD stage ([place])   Primary Care at [facility] [facility], Ranell Patrick, MD   2 months ago Essential hypertension   Primary Care at [facility], Ranell Patrick, MD   3 months ago Cough   Primary Care at [facility], Ines Bloomer, MD   3 months ago Type 2 diabetes mellitus with chronic kidney disease, without long-term current use of insulin, unspecified CKD stage [facility])   Primary Care at [facility], Ranell Patrick, MD      Future Appointments            In 1 month Carlota Raspberry Ranell Patrick, MD Primary Care at [place], [place]          atorvastatin (LIPITOR) 80 MG tablet 90 tablet 1    Sig: Take 1 tablet (80 mg total) by mouth daily at 6 PM.     Cardiovascular:  Antilipid - Statins Passed - 08/14/2018  5:35 PM      Passed - Total Cholesterol in normal range and within 360 days    Cholesterol, Total  Date Value Ref Range Status  06/27/2018 124 100 - 199 mg/dL Final         Passed - LDL in normal range and within 360  days    LDL Calculated  Date Value Ref Range Status  06/27/2018 44 0 - 99 mg/dL Final         Passed - HDL in normal range and within 360 days    HDL  Date Value Ref Range Status  06/27/2018 58 >39 mg/dL Final         Passed - Triglycerides in normal range and within 360 days    Triglycerides  Date Value Ref Range Status  06/27/2018 110 0 - 149 mg/dL Final         Passed - Patient is not pregnant      Passed - Valid encounter within last 12 months    Recent Outpatient Visits          1 week ago Type 2 diabetes mellitus with chronic kidney disease, without long-term current use of insulin, unspecified CKD stage [facility])   Primary Care at [facility], Ranell Patrick, MD   1 month ago Type 2 diabetes mellitus with chronic kidney disease, without long-term current use of insulin, unspecified CKD stage ([place])   Primary  Care at [facility], Ranell Patrick, MD   2 months ago Essential hypertension   Primary Care at [facility], Ranell Patrick, MD   3 months ago Cough   Primary Care at [facility], [place], MD   3 months ago Type 2 diabetes mellitus with chronic kidney disease, without long-term current use of insulin, unspecified CKD stage [facility])   Primary Care at [facility] [facility], Ranell Patrick, MD      Future Appointments            In 1 month [name], MD Primary Care at [place], [facility]          metoprolol tartrate (LOPRESSOR) 50 MG tablet 180 tablet 1    Sig: Take 1 tablet (50 mg total) by mouth 2 (two) times daily.     Cardiovascular:  Beta Blockers Passed - 08/14/2018  5:35 PM      Passed - Last BP in normal range    BP Readings from Last 1 Encounters:  08/02/18 138/82         Passed - Last Heart Rate in normal range    Pulse Readings from Last 1 Encounters:  08/02/18 63         Passed - Valid encounter within last 6 months    Recent Outpatient Visits          1 week ago Type 2 diabetes mellitus with chronic kidney disease, without long-term current use of insulin,  unspecified CKD stage [facility])   Primary Care at [facility], Ranell Patrick, MD   1 month ago Type 2 diabetes mellitus with chronic kidney disease, without long-term current use of insulin, unspecified CKD stage [facility])   Primary Care at [facility], Ranell Patrick, MD   2 months ago Essential hypertension   Primary Care at [facility], Ranell Patrick, MD   3 months ago Cough   Primary Care at [facility], Ines Bloomer, MD   3 months ago Type 2 diabetes mellitus with chronic kidney disease, without long-term current use of insulin, unspecified CKD stage [facility])   Primary Care at [facility], Ranell Patrick, MD      Future Appointments            In 1 month Carlota Raspberry Ranell Patrick, MD Primary Care at [place], [facility]         Signed Prescriptions Disp Refills   metFORMIN (GLUCOPHAGE) 500 MG tablet 180 tablet 1    Sig: Take 1 tablet (500 mg total) by mouth 2 (two) times daily with a meal.     Endocrinology:  Diabetes - Biguanides Failed - 08/14/2018  5:35 PM      Failed - Cr in normal range and within 360 days    Creat  Date Value Ref Range Status  04/22/2016 1.34 (H) 0.70 - 1.25 mg/dL Final    Comment:      For patients > or = 72 years of age: The upper reference limit for Creatinine is approximately 13% higher for people identified as African-American.      Creatinine, Ser  Date Value Ref Range Status  06/27/2018 1.41 (H) 0.76 - 1.27 mg/dL Final         Failed - eGFR in normal range and within 360 days    GFR, Est African American  Date Value Ref Range Status  10/15/2015 61 >=60 mL/min Final   GFR calc Af Amer  Date Value Ref Range Status  06/27/2018 58 (L) >59 mL/min/1.73 Final   GFR, Est  Non African American  Date Value Ref Range Status  10/15/2015 53 (L) >=60 mL/min Final    Comment:      The estimated GFR is a calculation valid for adults (>=79 years old) that uses the CKD-EPI algorithm to adjust for age and sex. It is   not to be used for children, pregnant women, hospitalized  patients,    patients on dialysis, or with rapidly changing kidney function. According to the NKDEP, eGFR >89 is normal, 60-89 shows mild impairment, 30-59 shows moderate impairment, 15-29 shows severe impairment and <15 is ESRD.      GFR calc non Af Amer  Date Value Ref Range Status  06/27/2018 50 (L) >59 mL/min/1.73 Final   GFR  Date Value Ref Range Status  12/30/2014 62.75 >60.00 mL/min Final         Passed - HBA1C is between 0 and 7.9 and within 180 days    Hgb A1c MFr Bld  Date Value Ref Range Status  08/07/2018 6.5 (H) 4.8 - 5.6 % Final    Comment:             Prediabetes: 5.7 - 6.4          Diabetes: >6.4          Glycemic control for adults with diabetes: <7.0          Passed - Valid encounter within last 6 months    Recent Outpatient Visits          1 week ago Type 2 diabetes mellitus with chronic kidney disease, without long-term current use of insulin, unspecified CKD stage ([place])   Primary Care at [facility], Ranell Patrick, MD   1 month ago Type 2 diabetes mellitus with chronic kidney disease, without long-term current use of insulin, unspecified CKD stage [facility])   Primary Care at [facility], Ranell Patrick, MD   2 months ago Essential hypertension   Primary Care at [facility], Ranell Patrick, MD   3 months ago Cough   Primary Care at [facility], Ines Bloomer, MD   3 months ago Type 2 diabetes mellitus with chronic kidney disease, without long-term current use of insulin, unspecified CKD stage [facility])   Primary Care at [facility], Ranell Patrick, MD      Future Appointments            In 1 month Carlota Raspberry Ranell Patrick, MD Primary Care at [place], [facility]          glipiZIDE (GLUCOTROL) 5 MG tablet 90 tablet 1    Sig: Take 1 tablet (5 mg total) by mouth daily before breakfast.     Endocrinology:  Diabetes - Sulfonylureas Passed - 08/14/2018  5:35 PM      Passed - HBA1C is between 0 and 7.9 and within 180 days    Hgb A1c MFr Bld  Date Value Ref Range Status   08/07/2018 6.5 (H) 4.8 - 5.6 % Final    Comment:             Prediabetes: 5.7 - 6.4          Diabetes: >6.4          Glycemic control for adults with diabetes: <7.0          Passed - Valid encounter within last 6 months    Recent Outpatient Visits          1 week ago Type 2 diabetes mellitus with chronic kidney disease, without long-term current use of  insulin, unspecified CKD stage [facility])   Primary Care at [facility], Ranell Patrick, MD   1 month ago Type 2 diabetes mellitus with chronic kidney disease, without long-term current use of insulin, unspecified CKD stage [facility])   Primary Care at [facility], Ranell Patrick, MD   2 months ago Essential hypertension   Primary Care at [facility], Ranell Patrick, MD   3 months ago Cough   Primary Care at [facility], [place], MD   3 months ago Type 2 diabetes mellitus with chronic kidney disease, without long-term current use of insulin, unspecified CKD stage [facility])   Primary Care at [facility], Ranell Patrick, MD      Future Appointments            In 1 month [name], MD Primary Care at [place], [facility]          irbesartan (AVAPRO) 75 MG tablet 90 tablet 1    Sig: Take 1 tablet (75 mg total) by mouth daily.     Cardiovascular:  Angiotensin Receptor Blockers Failed - 08/14/2018  5:35 PM      Failed - Cr in normal range and within 180 days    Creat  Date Value Ref Range Status  04/22/2016 1.34 (H) 0.70 - 1.25 mg/dL Final    Comment:      For patients > or = 72 years of age: The upper reference limit for Creatinine is approximately 13% higher for people identified as African-American.      Creatinine, Ser  Date Value Ref Range Status  06/27/2018 1.41 (H) 0.76 - 1.27 mg/dL Final         Passed - K in normal range and within 180 days    Potassium  Date Value Ref Range Status  06/27/2018 4.3 3.5 - 5.2 mmol/L Final         Passed - Patient is not pregnant      Passed - Last BP in normal range    BP Readings from Last  1 Encounters:  08/02/18 138/82         Passed - Valid encounter within last 6 months    Recent Outpatient Visits          1 week ago Type 2 diabetes mellitus with chronic kidney disease, without long-term current use of insulin, unspecified CKD stage [facility])   Primary Care at [facility], Ranell Patrick, MD   1 month ago Type 2 diabetes mellitus with chronic kidney disease, without long-term current use of insulin, unspecified CKD stage [facility])   Primary Care at [facility], Ranell Patrick, MD   2 months ago Essential hypertension   Primary Care at [facility], Ranell Patrick, MD   3 months ago Cough   Primary Care at [facility], [place], MD   3 months ago Type 2 diabetes mellitus with chronic kidney disease, without long-term current use of insulin, unspecified CKD stage [facility])   Primary Care at [facility], Ranell Patrick, MD      Future Appointments            In 1 month Carlota Raspberry Ranell Patrick, MD Primary Care at [place], [facility]

## 2018-08-14 NOTE — Telephone Encounter (Signed)
Patient called and asked does he want Dr. Carlota Raspberry to prescribe the medication previously prescribed by the cardiologist, he says he would like for Dr. Carlota Raspberry to refill them since he's not seeing the cardiologist until 2021.

## 2018-08-14 NOTE — Telephone Encounter (Signed)
Copied from [place] [id number]. Topic: Quick Communication - Rx Refill/Question >> Aug 14, 2018  5:01 PM Blase Mess A wrote: Medication: amLODipine (NORVASC) 10 MG tablet [[id number]] , atorvastatin (LIPITOR) 80 MG tablet [[id number]] , metoprolol tartrate (LOPRESSOR) 50 MG tablet [[id number]] ,metFORMIN (GLUCOPHAGE) 500 MG tablet [[id number]] , glipiZIDE (GLUCOTROL) 5 MG tablet [[id number]] , irbesartan (AVAPRO) 75 MG tablet [[id number]] ,   Has the patient contacted their pharmacy? Yes (Agent: If no, request that the patient contact the pharmacy for the refill.) (Agent: If yes, when and what did the pharmacy advise?)  Preferred Pharmacy (with phone number or street name): [facility], [place] [phone] (Phone) [phone] (Fax)    Agent: Please be advised that RX refills may take up to 3 business days. We ask that you follow-up with your pharmacy.

## 2018-08-17 MED ORDER — ATORVASTATIN CALCIUM 80 MG PO TABS: 80.0000 mg | ORAL_TABLET | Freq: Every day | ORAL | 1 refills | Status: DC

## 2018-08-17 MED ORDER — METOPROLOL TARTRATE 50 MG PO TABS: 50.0000 mg | ORAL_TABLET | Freq: Two times a day (BID) | ORAL | 1 refills | Status: DC

## 2018-08-17 MED ORDER — AMLODIPINE BESYLATE 10 MG PO TABS: 10.0000 mg | ORAL_TABLET | Freq: Every day | ORAL | 1 refills | Status: DC

## 2018-08-17 NOTE — Telephone Encounter (Signed)
Patient is requesting medication refills on Amlodipine 10 mg, Atorvastatin 80 mg and Metoprolol tartrate 50 mg, these medications were prescribed by his cardiologist. The patient's next appointment with the cardiologist is 07/31/2019, the patient wants you to refill medications because you are his PCP. I pend medications please advise, thank you.

## 2018-09-05 ENCOUNTER — Ambulatory Visit: Payer: Self-pay | Admitting: *Deleted

## 2018-09-05 NOTE — Telephone Encounter (Signed)
Pt called with experiencing mild equilibrium disturbance, which started on Monday. He feels ok now, slight lightheadedness. He is denying head congestion, or ear problems or colds.  Mild puffiness in his ankles. Started back taking the lasix which he takes as needed. His b/p this morning was 175/94 and now it is 136/79 HR 76. He took his b/p medication this morning. Denies any cardiac symptoms. Advised to call back for headache, weakness, shortness of breath,  dizziness, nausea or blurred vision. Also to check b/p and keep a reading of when he takes them and when he takes his medication.  Pt voiced understanding. He monitor his b/p for now. Routing to flow at PCP for any other recommendations.  Answer Assessment - Initial Assessment Questions 1. LOCATION: "Which joint is swollen?"     ankles 2. ONSET: "When did the swelling start?"     monday 3. SIZE: "How large is the swelling?"     Puffiness esp in the left ankle 4. PAIN: "Is there any pain?" If so, ask: "How bad is it?" (Scale 1-10; or mild, moderate, severe)     no 5. CAUSE: "What do you think caused the swollen joint?"     Not sure 6. OTHER SYMPTOMS: "Do you have any other symptoms?" (e.g., fever, chest pain, difficulty breathing, calf pain)     no 7. PREGNANCY: "Is there any chance you are pregnant?" "When was your last menstrual period?"     n/a  Protocols used: ANKLE SWELLING-A-AH

## 2018-09-12 NOTE — Telephone Encounter (Signed)
Call to pt.  States the equilibrium is mostly resolved.  Has some lightheadedness but feels better than it did.  Does not feel the need to come in for an appt.  Instructed to c/b if he has any further questions.

## 2018-10-12 ENCOUNTER — Encounter: Payer: Self-pay | Admitting: Family Medicine

## 2018-10-12 ENCOUNTER — Ambulatory Visit (INDEPENDENT_AMBULATORY_CARE_PROVIDER_SITE_OTHER): Payer: Medicare Other | Admitting: Family Medicine

## 2018-10-12 ENCOUNTER — Other Ambulatory Visit: Payer: Self-pay

## 2018-10-12 VITALS — BP 160/72 | HR 74 | Temp 98.7°F | Resp 16 | Ht 63.0 in | Wt 194.4 lb

## 2018-10-12 DIAGNOSIS — Z87898 Personal history of other specified conditions: Secondary | ICD-10-CM

## 2018-10-12 DIAGNOSIS — I10 Essential (primary) hypertension: Secondary | ICD-10-CM | POA: Diagnosis not present

## 2018-10-12 DIAGNOSIS — E1122 Type 2 diabetes mellitus with diabetic chronic kidney disease: Secondary | ICD-10-CM | POA: Diagnosis not present

## 2018-10-12 MED ORDER — FUROSEMIDE 20 MG PO TABS: 20.0000 mg | ORAL_TABLET | Freq: Every day | ORAL | 6 refills | Status: DC | PRN

## 2018-10-12 MED ORDER — IRBESARTAN 150 MG PO TABS: 75.0000 mg | ORAL_TABLET | Freq: Every day | ORAL | 1 refills | Status: DC

## 2018-10-12 NOTE — Patient Instructions (Addendum)
I'm glad to hear that the dizziness is improved. Make sure to let me know if you change meds at home so we can discuss options. For now, increase irbesartan to 150mg  per day (2 of current Rx, new one for 150mg  sent to pharmacy).   1 month lab only visit.   Send me a Mychart message with home blood pressure readings in next few weeks.   [place] office visit in 4 months.    If you have lab work done today you will be contacted with your lab results within the next 2 weeks.  If you have not heard from [place] then please contact us. The fastest way to get your results is to register for My Chart.   IF you received an x-ray today, you will receive an invoice from [facility]. Please contact [facility] at [phone] with questions or concerns regarding your invoice.   IF you received labwork today, you will receive an invoice from [place]. Please contact [facility] at [phone] with questions or concerns regarding your invoice.   Our billing staff will not be able to assist you with questions regarding bills from these companies.  You will be contacted with the lab results as soon as they are available. The fastest way to get your results is to activate your My Chart account. Instructions are located on the last page of this paperwork. If you have not heard from [place] regarding the results in 2 weeks, please contact this office.

## 2018-10-12 NOTE — Progress Notes (Signed)
Subjective:    Patient ID: Antonio Wells, male    DOB: Sep 16, 1945, 73 y.o.   MRN: [id number]  HPI Antonio Wells is a 73 y.o. male Presents today for: Chief Complaint  Patient presents with  . Hypertension    need a new rx for bp called into the office 1 month stating the med was given me dizziness and equalibrum was off. I myself stopped the med to see if that was what was causing my problem and it was casuse when i stoped the meds i was fine( Amlodipine) Bp has been high since off this med and wants to see about going on another bp med   Hypertension: BP Readings from Last 3 Encounters:  10/12/18 (!) 160/72  08/02/18 138/82  07/10/18 (!) 142/70   Lab Results  Component Value Date   CREATININE 1.41 (H) 06/27/2018  range 1.28-1.41 in past year. High of 2.19 in 2018 (AKI at that time).   Phone note in February, some equilibrium disturbance, that improved on its own next day. Stopped amlodipine on own.  No known precipitants. No dizziness since amlodipine stopped.   Did have slight puffiness in his ankles but started back on Lasix.  BP on telephone call initially 175/94 then down to 136/79. Cardiology visit in January.  Dr. Meda Coffee.  Recommended compression socks for peripheral edema, maintain hydration when exercising has borderline creatinine.  1 year follow-up with cardiology. He did discuss fleeting symptoms of dizziness with positional changes at his last visit with me, approximately once per week that resolved quickly. Feels that symptoms have improved since stopping amlodipine.  Had been on 10 mg daily. Taking lasix daily for past few weeks for swelling. Ran out last week. Not using compression stockings. Feels like tight.  Home readings: 140-150/90 on amlodipine. Readings 159/98, 203/117 No slurred speech, no chest pain,  no focal weakness. Less swelling off amlodipine.  Still on toprol 50mg  BID, avapro 75mg  qd.   Diabetes:  Lab Results  Component Value Date   HGBA1C 6.5 (H) 08/07/2018   HGBA1C 7.5 (A) 04/28/2018   HGBA1C 7.3 (H) 12/30/2017   Lab Results  Component Value Date   MICROALBUR 39.6 08/25/2015   LDLCALC 44 06/27/2018   CREATININE 1.41 (H) 06/27/2018  A1c controlled in January. Complicated by chronic kidney disease, microalbuminuria. He is on ARB and statin. On metformin and glipizide. No new side effects.   Patient Active Problem List   Diagnosis Date Noted  . Cough 03/17/2018  . Lower resp. tract infection 03/17/2018  . Sleep apnea with use of continuous positive airway pressure (CPAP) 01/04/2018  . Malnutrition of moderate degree 07/14/2017  . Thrush 07/11/2017  . FTT (failure to thrive) in adult 07/11/2017  . 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 CAD (coronary artery disease) 05/25/2017  . Unstable angina ([place])   . Hypertensive heart disease 07/14/2015  . Lower extremity edema 07/14/2015  . Aortic stenosis 01/09/2015  . OSA on CPAP 12/12/2014  . Hypersomnia with sleep apnea 12/12/2014  . Obstructive sleep apnea (adult) (pediatric) 02/04/2014  . 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 Obesity 02/04/2014  . Mitral regurgitation and aortic stenosis 01/02/2014  . DOE (dyspnea on exertion) 11/26/2013  . Gout 08/12/2011  . Hypertension 08/12/2011  . High cholesterol 08/12/2011  . OSA (obstructive sleep apnea) 08/12/2011  . Diabetes type 2, uncontrolled ([place]) 08/12/2011   Past Medical History:  Diagnosis Date  . Arthritis    "knees" (07/11/2017)  . CKD (chronic kidney disease),  stage III ([place])   . Coronary artery disease    a.  CABGx3 in 05/2017.  Marland Kitchen Gout   . Heart murmur   . High cholesterol   . Hypertension   . Mild aortic stenosis   . Mild mitral regurgitation   . OSA on CPAP   . Postoperative atrial fibrillation ([place]) 06/2017  . Sleep apnea   . Type II diabetes mellitus ([place])    Past Surgical History:  Procedure Laterality Date  . CARDIAC CATHETERIZATION    . COLONOSCOPY  2008  . CORONARY ARTERY BYPASS GRAFT N/A  06/24/2017   Procedure: CORONARY ARTERY BYPASS GRAFTING (CABG) x 3 ; Using Left Internal Mammary Artery and Right Great Saphenous Leg Vein Harvested Endoscopically;  Surgeon: Gaye Pollack, MD;  Location: [facility];  Service: Open Heart Surgery;  Laterality: N/A;  . EYE SURGERY    . LEFT HEART CATH AND CORONARY ANGIOGRAPHY N/A 05/25/2017   Procedure: LEFT HEART CATH AND CORONARY ANGIOGRAPHY;  Surgeon: Wellington Hampshire, MD;  Location: [facility];  Service: Cardiovascular;  Laterality: N/A;  . TEE WITHOUT CARDIOVERSION N/A 06/24/2017   Procedure: TRANSESOPHAGEAL ECHOCARDIOGRAM (TEE);  Surgeon: Gaye Pollack, MD;  Location: [facility];  Service: Open Heart Surgery;  Laterality: N/A;   Allergies  Allergen Reactions  . Penicillins Other (See Comments)    Has patient had a PCN reaction causing immediate rash, facial/tongue/throat swelling, SOB or lightheadedness with hypotension: No Has patient had a PCN reaction causing severe rash involving mucus membranes or skin necrosis: No Has patient had a PCN reaction that required hospitalization: No Has patient had a PCN reaction occurring within the last 10 years: No If all of the above answers are "NO", then may proceed with Cephalosporin use.   Passed out ? SYNCOPE ?  Marland Kitchen Lisinopril Cough  . Amiodarone Nausea And Vomiting  . Oxycodone Other (See Comments)    hallucinations  . Zofran [Ondansetron Hcl]     Has an interaction with another medication pt takes   . Cortisone Nausea And Vomiting and Other (See Comments)    HICCUPS  . Tramadol Nausea And Vomiting and Other (See Comments)    HICCUPS   Prior to Admission medications   Medication Sig Start Date End Date Taking? Authorizing Provider  aspirin EC 81 MG tablet Take 1 tablet (81 mg total) by mouth daily. 09/02/17  Yes Dunn, Dayna N, PA-C  atorvastatin (LIPITOR) 80 MG tablet Take 1 tablet (80 mg total) by mouth daily at 6 PM. 08/17/18  Yes [name], MD  colchicine 0.6 MG tablet Take  1-2 tablets (0.6-1.2 mg total) by mouth daily as needed (for gout flare). 2 po at onset of flair and then repeat in 1 h for acute gout attack 12/30/17  Yes [name], MD  furosemide (LASIX) 20 MG tablet Take 1 tablet (20 mg total) by mouth daily as needed for fluid or edema. 12/29/17  Yes [name], MD  glipiZIDE (GLUCOTROL) 5 MG tablet Take 1 tablet (5 mg total) by mouth daily before breakfast. 08/14/18  Yes [name], MD  irbesartan (AVAPRO) 75 MG tablet Take 1 tablet (75 mg total) by mouth daily. 08/14/18  Yes [name], MD  metFORMIN (GLUCOPHAGE) 500 MG tablet Take 1 tablet (500 mg total) by mouth 2 (two) times daily with a meal. 08/14/18  Yes [name], MD  metoprolol tartrate (LOPRESSOR) 50 MG tablet Take 1 tablet (50 mg total) by mouth  2 (two) times daily. 08/17/18  Yes [name], MD   Social History   Socioeconomic History  . Marital status: Married    Spouse name: [name]  . Number of children: 3  . Years of education: 38  . Highest education level: Not on file  Occupational History  . Occupation: Musician  Social Needs  . Financial resource strain: Not on file  . Food insecurity:    Worry: Not on file    Inability: Not on file  . Transportation needs:    Medical: Not on file    Non-medical: Not on file  Tobacco Use  . Smoking status: Former Smoker    Packs/day: 2.00    Years: 15.00    Pack years: 30.00    Types: Cigarettes    Last attempt to quit: 07/26/1998    Years since quitting: 20.2  . Smokeless tobacco: Never Used  Substance and Sexual Activity  . Alcohol use: No    Alcohol/week: 0.0 standard drinks  . Drug use: No  . Sexual activity: Yes  Lifestyle  . Physical activity:    Days per week: Not on file    Minutes per session: Not on file  . Stress: Not on file  Relationships  . Social connections:    Talks on phone: Not on file    Gets together: Not on file    Attends religious service: Not on file    Active member of  club or organization: Not on file    Attends meetings of clubs or organizations: Not on file    Relationship status: Not on file  . Intimate partner violence:    Fear of current or ex partner: Not on file    Emotionally abused: Not on file    Physically abused: Not on file    Forced sexual activity: Not on file  Other Topics Concern  . Not on file  Social History Narrative   Patient is married [name]) and lives at home with his wife.   Patient has three adult children.   Patient is retired.   Patient is a Research officer, political party.   Patient is right-handed.   Patient drinks one cup of coffee daily and 2-3 sodas per day.       Review of Systems     Objective:   Physical Exam Vitals signs reviewed.  Constitutional:      Appearance: He is well-developed.  HENT:     Head: Normocephalic and atraumatic.  Eyes:     Pupils: Pupils are equal, round, and reactive to light.  Neck:     Vascular: No carotid bruit or JVD.  Cardiovascular:     Rate and Rhythm: Normal rate and regular rhythm.     Heart sounds: Normal heart sounds. No murmur.  Pulmonary:     Effort: Pulmonary effort is normal.     Breath sounds: Normal breath sounds. No rales.  Musculoskeletal:     Right lower leg: No edema.     Left lower leg: No edema.  Skin:    General: Skin is warm and dry.  Neurological:     General: No focal deficit present.     Mental Status: He is alert and oriented to person, place, and time. Mental status is at baseline.     Motor: No weakness.     Gait: Gait normal.  Psychiatric:        Mood and Affect: Mood normal.    Vitals:   10/12/18 0859  BP: Marland Kitchen)  160/72  Pulse: 74  Resp: 16  Temp: 98.7 F (37.1 C)  TempSrc: Oral  SpO2: 97%  Weight: 194 lb 6.4 oz (88.2 kg)  Height: 5\' 3"  (1.6 m)      Assessment & Plan:   Antonio Wells is a 73 y.o. male Essential hypertension - Plan: furosemide (LASIX) 20 MG tablet, irbesartan (AVAPRO) 150 MG tablet, Basic metabolic panel History of  dizziness  -Elevated readings in office but not as high as home readings.  Feels better as far as dizziness off the amlodipine.  On further discussion may have had a component of vertigo which is now resolved, no focal weakness, no residual deficits, imaging/further work-up deferred at this time.  RTC/ER precautions if recurs.  -Check creatinine, increase Avapro to 150 mg.  Continue metoprolol same dose, Lasix as needed for peripheral edema but that appears improved at this time off amlodipine.  Compression stockings as needed.  -Update by MyChart next few weeks.  -Lab only visit in 1 month.  Type 2 diabetes mellitus with chronic kidney disease, without long-term current use of insulin, unspecified CKD stage ([place]) - Plan: Basic metabolic panel  -Control previously.  No changes for now, recheck in 4 months.  Meds ordered this encounter  Medications  . furosemide (LASIX) 20 MG tablet    Sig: Take 1 tablet (20 mg total) by mouth daily as needed for fluid or edema.    Dispense:  30 tablet    Refill:  6  . irbesartan (AVAPRO) 150 MG tablet    Sig: Take 0.5 tablets (75 mg total) by mouth daily.    Dispense:  90 tablet    Refill:  1   Patient Instructions   I'm glad to hear that the dizziness is improved. Make sure to let me know if you change meds at home so we can discuss options. For now, increase irbesartan to 150mg  per day (2 of current Rx, new one for 150mg  sent to pharmacy).   1 month lab only visit.   Send me a Mychart message with home blood pressure readings in next few weeks.   [place] office visit in 4 months.    If you have lab work done today you will be contacted with your lab results within the next 2 weeks.  If you have not heard from [place] then please contact us. The fastest way to get your results is to register for My Chart.   IF you received an x-ray today, you will receive an invoice from [facility]. Please contact [facility] at [phone] with  questions or concerns regarding your invoice.   IF you received labwork today, you will receive an invoice from [facility]. Please contact [facility] at [phone] with questions or concerns regarding your invoice.   Our billing staff will not be able to assist you with questions regarding bills from these companies.  You will be contacted with the lab results as soon as they are available. The fastest way to get your results is to activate your My Chart account. Instructions are located on the last page of this paperwork. If you have not heard from [place] regarding the results in 2 weeks, please contact this office.       Signed,   [name], MD [facility].  10/12/18 9:41 AM

## 2018-10-13 LAB — BASIC METABOLIC PANEL
BUN/Creatinine Ratio: 11 (ref 10–24)
BUN: 17 mg/dL (ref 8–27)
CO2: 21 mmol/L (ref 20–29)
Calcium: 9.2 mg/dL (ref 8.6–10.2)
Chloride: 104 mmol/L (ref 96–106)
Creatinine, Ser: 1.54 mg/dL — ABNORMAL HIGH (ref 0.76–1.27)
GFR calc Af Amer: 52 mL/min/{1.73_m2} — ABNORMAL LOW (ref >59)
GFR calc non Af Amer: 45 mL/min/{1.73_m2} — ABNORMAL LOW (ref >59)
Glucose: 155 mg/dL — ABNORMAL HIGH (ref 65–99)
Potassium: 4.4 mmol/L (ref 3.5–5.2)
Sodium: 143 mmol/L (ref 134–144)

## 2018-10-17 ENCOUNTER — Encounter: Payer: Self-pay | Admitting: Family Medicine

## 2018-10-17 DIAGNOSIS — G4733 Obstructive sleep apnea (adult) (pediatric): Secondary | ICD-10-CM | POA: Diagnosis not present

## 2018-10-19 ENCOUNTER — Encounter (HOSPITAL_COMMUNITY): Payer: Self-pay | Admitting: Internal Medicine

## 2018-10-19 ENCOUNTER — Emergency Department (HOSPITAL_COMMUNITY): Payer: Medicare Other

## 2018-10-19 ENCOUNTER — Other Ambulatory Visit: Payer: Self-pay

## 2018-10-19 ENCOUNTER — Observation Stay (HOSPITAL_COMMUNITY)
Admission: EM | Admit: 2018-10-19 | Discharge: 2018-10-21 | Disposition: A | Discharge status: Home / Self Care | Payer: Medicare Other | Attending: Internal Medicine | Admitting: Internal Medicine

## 2018-10-19 DIAGNOSIS — E1129 Type 2 diabetes mellitus with other diabetic kidney complication: Secondary | ICD-10-CM | POA: Diagnosis present

## 2018-10-19 DIAGNOSIS — I639 Cerebral infarction, unspecified: Secondary | ICD-10-CM | POA: Diagnosis not present

## 2018-10-19 DIAGNOSIS — G4733 Obstructive sleep apnea (adult) (pediatric): Secondary | ICD-10-CM | POA: Diagnosis not present

## 2018-10-19 DIAGNOSIS — N183 Chronic kidney disease, stage 3 unspecified: Secondary | ICD-10-CM | POA: Diagnosis present

## 2018-10-19 DIAGNOSIS — E119 Type 2 diabetes mellitus without complications: Secondary | ICD-10-CM | POA: Insufficient documentation

## 2018-10-19 DIAGNOSIS — Z87891 Personal history of nicotine dependence: Secondary | ICD-10-CM | POA: Insufficient documentation

## 2018-10-19 DIAGNOSIS — R51 Headache: Secondary | ICD-10-CM | POA: Diagnosis not present

## 2018-10-19 DIAGNOSIS — Z79899 Other long term (current) drug therapy: Secondary | ICD-10-CM | POA: Diagnosis not present

## 2018-10-19 DIAGNOSIS — I10 Essential (primary) hypertension: Secondary | ICD-10-CM | POA: Diagnosis not present

## 2018-10-19 DIAGNOSIS — R4781 Slurred speech: Secondary | ICD-10-CM | POA: Diagnosis not present

## 2018-10-19 DIAGNOSIS — I674 Hypertensive encephalopathy: Secondary | ICD-10-CM | POA: Diagnosis not present

## 2018-10-19 DIAGNOSIS — I169 Hypertensive crisis, unspecified: Secondary | ICD-10-CM

## 2018-10-19 DIAGNOSIS — Z9989 Dependence on other enabling machines and devices: Secondary | ICD-10-CM

## 2018-10-19 DIAGNOSIS — Z951 Presence of aortocoronary bypass graft: Secondary | ICD-10-CM | POA: Diagnosis not present

## 2018-10-19 LAB — URINALYSIS, ROUTINE W REFLEX MICROSCOPIC
Bilirubin Urine: NEGATIVE
Glucose, UA: NEGATIVE mg/dL
Ketones, ur: NEGATIVE mg/dL
Leukocytes,Ua: NEGATIVE
Nitrite: NEGATIVE
PROTEIN: 100 mg/dL — AB
Specific Gravity, Urine: 1.019 (ref 1.005–1.030)
pH: 7 (ref 5.0–8.0)

## 2018-10-19 LAB — RAPID URINE DRUG SCREEN, HOSP PERFORMED
Amphetamines: NOT DETECTED
Barbiturates: NOT DETECTED
Benzodiazepines: NOT DETECTED
COCAINE: NOT DETECTED
OPIATES: NOT DETECTED
Tetrahydrocannabinol: NOT DETECTED

## 2018-10-19 LAB — CBC
HCT: 42.4 % (ref 39.0–52.0)
Hemoglobin: 14 g/dL (ref 13.0–17.0)
MCH: 30.3 pg (ref 26.0–34.0)
MCHC: 33 g/dL (ref 30.0–36.0)
MCV: 91.8 fL (ref 80.0–100.0)
NRBC: 0 % (ref 0.0–0.2)
Platelets: 186 10*3/uL (ref 150–400)
RBC: 4.62 MIL/uL (ref 4.22–5.81)
RDW: 13.5 % (ref 11.5–15.5)
WBC: 5.1 10*3/uL (ref 4.0–10.5)

## 2018-10-19 LAB — COMPREHENSIVE METABOLIC PANEL
ALT: 24 U/L (ref 0–44)
ANION GAP: 12 (ref 5–15)
AST: 28 U/L (ref 15–41)
Albumin: 4.3 g/dL (ref 3.5–5.0)
Alkaline Phosphatase: 64 U/L (ref 38–126)
BUN: 16 mg/dL (ref 8–23)
CO2: 25 mmol/L (ref 22–32)
Calcium: 9 mg/dL (ref 8.9–10.3)
Chloride: 104 mmol/L (ref 98–111)
Creatinine, Ser: 1.48 mg/dL — ABNORMAL HIGH (ref 0.61–1.24)
GFR, EST AFRICAN AMERICAN: 54 mL/min — AB (ref >60)
GFR, EST NON AFRICAN AMERICAN: 47 mL/min — AB (ref >60)
Glucose, Bld: 86 mg/dL (ref 70–99)
Potassium: 4 mmol/L (ref 3.5–5.1)
Sodium: 141 mmol/L (ref 135–145)
Total Bilirubin: 0.7 mg/dL (ref 0.3–1.2)
Total Protein: 7 g/dL (ref 6.5–8.1)

## 2018-10-19 LAB — DIFFERENTIAL
Abs Immature Granulocytes: 0 10*3/uL (ref 0.00–0.07)
Basophils Absolute: 0 10*3/uL (ref 0.0–0.1)
Basophils Relative: 0 %
EOS ABS: 0.1 10*3/uL (ref 0.0–0.5)
Eosinophils Relative: 2 %
Immature Granulocytes: 0 %
Lymphocytes Relative: 52 %
Lymphs Abs: 2.7 10*3/uL (ref 0.7–4.0)
Monocytes Absolute: 0.5 10*3/uL (ref 0.1–1.0)
Monocytes Relative: 10 %
Neutro Abs: 1.8 10*3/uL (ref 1.7–7.7)
Neutrophils Relative %: 36 %

## 2018-10-19 LAB — PROTIME-INR
INR: 1 (ref 0.8–1.2)
Prothrombin Time: 12.9 seconds (ref 11.4–15.2)

## 2018-10-19 LAB — APTT: aPTT: 38 seconds — ABNORMAL HIGH (ref 24–36)

## 2018-10-19 LAB — CBG MONITORING, ED: Glucose-Capillary: 66 mg/dL — ABNORMAL LOW (ref 70–99)

## 2018-10-19 LAB — ETHANOL: Alcohol, Ethyl (B): <10 mg/dL (ref <10)

## 2018-10-19 LAB — I-STAT CREATININE, ED: Creatinine, Ser: 1.5 mg/dL — ABNORMAL HIGH (ref 0.61–1.24)

## 2018-10-19 MED ORDER — IOHEXOL 350 MG/ML SOLN
75.0000 mL | Freq: Once | INTRAVENOUS | Status: AC | PRN
  Administered 2018-10-19: 75 mL via INTRAVENOUS

## 2018-10-19 MED ORDER — LABETALOL HCL 5 MG/ML IV SOLN
10.0000 mg | INTRAVENOUS | Status: DC | PRN
  Administered 2018-10-19: 10 mg via INTRAVENOUS

## 2018-10-19 MED ORDER — HEPARIN SODIUM (PORCINE) 5000 UNIT/ML IJ SOLN
5000.0000 [IU] | Freq: Three times a day (TID) | INTRAMUSCULAR | Status: DC
  Administered 2018-10-20 – 2018-10-21 (×3): 5000 [IU] via SUBCUTANEOUS
  Filled 2018-10-19 (×4): qty 1

## 2018-10-19 MED ORDER — METOPROLOL TARTRATE 50 MG PO TABS
50.0000 mg | ORAL_TABLET | Freq: Two times a day (BID) | ORAL | Status: DC
  Administered 2018-10-19 – 2018-10-20 (×3): 50 mg via ORAL
  Filled 2018-10-19 (×3): qty 1

## 2018-10-19 MED ORDER — ATORVASTATIN CALCIUM 80 MG PO TABS
80.0000 mg | ORAL_TABLET | Freq: Every day | ORAL | Status: DC
  Administered 2018-10-20: 80 mg via ORAL
  Filled 2018-10-19: qty 1

## 2018-10-19 MED ORDER — ACETAMINOPHEN 325 MG PO TABS
650.0000 mg | ORAL_TABLET | Freq: Four times a day (QID) | ORAL | Status: DC | PRN
  Administered 2018-10-19 – 2018-10-21 (×4): 650 mg via ORAL
  Filled 2018-10-19 (×4): qty 2

## 2018-10-19 MED ORDER — ACETAMINOPHEN 500 MG PO TABS
1000.0000 mg | ORAL_TABLET | Freq: Once | ORAL | Status: AC
  Administered 2018-10-19: 1000 mg via ORAL
  Filled 2018-10-19: qty 2

## 2018-10-19 MED ORDER — IRBESARTAN 150 MG PO TABS
150.0000 mg | ORAL_TABLET | Freq: Every day | ORAL | Status: DC
  Administered 2018-10-20 – 2018-10-21 (×2): 150 mg via ORAL
  Filled 2018-10-19 (×2): qty 1

## 2018-10-19 MED ORDER — CLEVIDIPINE BUTYRATE 0.5 MG/ML IV EMUL: 0.0000 mg/h | INTRAVENOUS | Status: DC

## 2018-10-19 MED ORDER — ACETAMINOPHEN 650 MG RE SUPP: 650.0000 mg | Freq: Four times a day (QID) | RECTAL | Status: DC | PRN

## 2018-10-19 MED ORDER — IRBESARTAN 150 MG PO TABS: 150.0000 mg | ORAL_TABLET | Freq: Every day | ORAL | Status: DC

## 2018-10-19 MED ORDER — LABETALOL HCL 5 MG/ML IV SOLN
20.0000 mg | Freq: Once | INTRAVENOUS | Status: AC
  Administered 2018-10-19: 20 mg via INTRAVENOUS
  Filled 2018-10-19: qty 4

## 2018-10-19 MED ORDER — ASPIRIN EC 81 MG PO TBEC
81.0000 mg | DELAYED_RELEASE_TABLET | Freq: Every day | ORAL | Status: DC
  Administered 2018-10-20 – 2018-10-21 (×2): 81 mg via ORAL
  Filled 2018-10-19 (×2): qty 1

## 2018-10-19 MED ORDER — IRBESARTAN 75 MG PO TABS: 75.0000 mg | ORAL_TABLET | Freq: Every day | ORAL | Status: DC

## 2018-10-19 MED ORDER — LABETALOL HCL 5 MG/ML IV SOLN
10.0000 mg | INTRAVENOUS | Status: DC | PRN
  Filled 2018-10-19: qty 4

## 2018-10-19 MED ORDER — INSULIN ASPART 100 UNIT/ML ~~LOC~~ SOLN
0.0000 [IU] | Freq: Three times a day (TID) | SUBCUTANEOUS | Status: DC
  Administered 2018-10-20 – 2018-10-21 (×3): 2 [IU] via SUBCUTANEOUS

## 2018-10-19 MED ORDER — LABETALOL HCL 5 MG/ML IV SOLN
10.0000 mg | INTRAVENOUS | Status: DC | PRN
  Administered 2018-10-20 – 2018-10-21 (×4): 10 mg via INTRAVENOUS
  Filled 2018-10-19 (×4): qty 4

## 2018-10-19 MED ORDER — LORAZEPAM 2 MG/ML IJ SOLN
1.0000 mg | Freq: Once | INTRAMUSCULAR | Status: AC
  Administered 2018-10-19: 1 mg via INTRAVENOUS
  Filled 2018-10-19: qty 1

## 2018-10-19 MED ORDER — HYDRALAZINE HCL 25 MG PO TABS
25.0000 mg | ORAL_TABLET | Freq: Once | ORAL | Status: AC
  Administered 2018-10-19: 25 mg via ORAL
  Filled 2018-10-19: qty 1

## 2018-10-19 MED ORDER — NICARDIPINE HCL IN NACL 20-0.86 MG/200ML-% IV SOLN
3.0000 mg/h | INTRAVENOUS | Status: DC
  Administered 2018-10-19: 5 mg/h via INTRAVENOUS
  Filled 2018-10-19: qty 200

## 2018-10-19 NOTE — ED Notes (Signed)
Pt remains in MRI 

## 2018-10-19 NOTE — Consult Note (Signed)
Neurology Consultation Reason for Consult: Aphasia Referring Physician: Rudean Haskell  CC: Aphasia  History is obtained from: Patient  HPI: Antonio Wells is a 73 y.o. male with a history of hypertension, diabetes, hyperlipidemia who was in his normal state of health up until the past few days.  He has noted that his blood pressure has been running a little high, in the 160s, and over the past couple days has had a left temporal headache.  Today, his headache was worse and shortly after noon he began having some difficulty with word finding.  He states that he simply has trouble coming up with the correct word and this moment.  He denies numbness or weakness at the current time, but he does note that he has had some numbness of his right hand over the past few weeks but has come and gone.   LKW: Noon tpa given?: no, mild symptoms   ROS: A complete ROS was performed and is negative except as noted in the HPI.   Past Medical History:  Diagnosis Date  . Arthritis    "knees" (07/11/2017)  . CKD (chronic kidney disease), stage III ([place])   . Coronary artery disease    a.  CABGx3 in 05/2017.  Marland Kitchen Gout   . Heart murmur   . High cholesterol   . Hypertension   . Mild aortic stenosis   . Mild mitral regurgitation   . OSA on CPAP   . Postoperative atrial fibrillation ([place]) 06/2017  . Sleep apnea   . Type II diabetes mellitus ([facility])      Family History  Problem Relation Age of Onset  . Hypertension Mother   . Kidney disease Mother   . Diabetes Mother   . Hypertension Father   . Cancer Brother        lung  . Colon cancer Brother   . Stroke Brother   . Esophageal cancer Neg Hx   . Rectal cancer Neg Hx   . Stomach cancer Neg Hx      Social History:  reports that he quit smoking about 20 years ago. His smoking use included cigarettes. He has a 30.00 pack-year smoking history. He has never used smokeless tobacco. He reports that he does not drink alcohol or use drugs.   Exam: Current  vital signs: BP (!) 216/87   Pulse 80   Temp 98.9 F (37.2 C) (Oral)   Resp (!) 21   Ht 5\' 3"  (1.6 m)   Wt 88.1 kg   SpO2 99%   BMI 34.41 kg/m  Vital signs in last 24 hours: Temp:  [98.9 F (37.2 C)] 98.9 F (37.2 C) (03/26 1447) Pulse Rate:  [76-82] 80 (03/26 1530) Resp:  [16-27] 21 (03/26 1530) BP: (216-233)/(87-104) 216/87 (03/26 1521) SpO2:  [99 %] 99 % (03/26 1447) Weight:  [88 kg-88.1 kg] 88.1 kg (03/26 1500)   Physical Exam  Constitutional: Appears well-developed and well-nourished.  Psych: Affect appropriate to situation Eyes: No scleral injection HENT: No OP obstrucion Head: Normocephalic.  Cardiovascular: Normal rate and regular rhythm.  Respiratory: Effort normal, non-labored breathing GI: Soft.  No distension. There is no tenderness.  Skin: WDI  Neuro: Mental Status: Patient is awake, alert, oriented to person, place, month, year, and situation. Patient is able to give a clear and coherent history. No signs of neglect.  He has very mild difficulty with word finding.  He is able to name all the objects except for hammock and Cranial Nerves: II: Visual  Fields are full. Pupils are equal, round, and reactive to light.   III,IV, VI: EOMI without ptosis or diploplia.  V: Facial sensation is symmetric to temperature VII: Facial movement is symmetric.  VIII: hearing is intact to voice X: Uvula elevates symmetrically XI: Shoulder shrug is symmetric. XII: tongue is midline without atrophy or fasciculations.  Motor: Tone is normal. Bulk is normal. 5/5 strength was present in all four extremities.  Sensory: Sensation is symmetric to light touch and temperature in the arms and legs. Cerebellar: FNF and HKS are intact bilaterally    I have reviewed labs in epic and the results pertinent to this consultation are: Creatinine 1.5  I have reviewed the images obtained: CT-no acute findings, CTA-no explanation for aphasia  Impression: 73 year old male with mild  word finding difficulty in the setting of severe hypertension.  My suspicion is that this represents hypertensive encephalopathy, but a small stroke is certainly possible.  I discussed IV TPA with the patient, and that my recommendation given how mild his symptoms are would be to not proceed with this and he expressed understanding.  Recommendations: 1) MRI brain 2) if negative for stroke, then I would recommend aggressive treatment of hypertensive encephalopathy 3) if positive for stroke, then he will likely need further stroke work-up.   Roland Rack, MD [facility] [phone]  If 7pm- 7am, please page neurology on call as listed in [place].

## 2018-10-19 NOTE — ED Notes (Addendum)
ED TO INPATIENT HANDOFF REPORT  ED Nurse Name and Phone #: Gifford Shave [phone]  S Name/Age/Gender Antonio Wells 73 y.o. male Room/Bed: 035C/035C  Code Status   Code Status: Full Code  Home/SNF/Other 3W20C Patient oriented to: self, place, time and situation Is this baseline? Yes   Triage Complete: Triage complete  Chief Complaint stroke sx  Triage Note Pt here for evaluation of headache x 2 days and trouble making his words since 12 noon today. Pt sts he knows what he wants to say but is having trouble getting the words.    Allergies Allergies  Allergen Reactions  . Penicillins Other (See Comments)    Has patient had a PCN reaction causing immediate rash, facial/tongue/throat swelling, SOB or lightheadedness with hypotension: No Has patient had a PCN reaction causing severe rash involving mucus membranes or skin necrosis: No Has patient had a PCN reaction that required hospitalization: No Has patient had a PCN reaction occurring within the last 10 years: No If all of the above answers are "NO", then may proceed with Cephalosporin use.   Passed out ? SYNCOPE ?  Marland Kitchen Lisinopril Cough  . Amiodarone Nausea And Vomiting  . Amlodipine Other (See Comments)    Causes dizzines  . Oxycodone Other (See Comments)    hallucinations  . Zofran [Ondansetron Hcl] Other (See Comments)    Has an interaction with another medication pt takes   . Cortisone Nausea And Vomiting and Other (See Comments)    HICCUPS  . Tramadol Nausea And Vomiting and Other (See Comments)    HICCUPS    Level of Care/Admitting Diagnosis ED Disposition    ED Disposition Condition [facility] Area: [place] [[id number]]  Level of Care: Telemetry Medical [104]  I expect the patient will be discharged within 24 hours: No (not a candidate for 5C-Observation unit)  Diagnosis: Hypertensive encephalopathy [437.2.ICD-9-CM]  Admitting Physician: Rise Patience [phone]   Attending Physician: Rise Patience [phone]  PT Class (Do Not Modify): Observation [104]  PT Acc Code (Do Not Modify): Observation [10022]       B Medical/Surgery History Past Medical History:  Diagnosis Date  . Arthritis    "knees" (07/11/2017)  . CKD (chronic kidney disease), stage III ([place])   . Coronary artery disease    a.  CABGx3 in 05/2017.  Marland Kitchen Gout   . Heart murmur   . High cholesterol   . Hypertension   . Mild aortic stenosis   . Mild mitral regurgitation   . OSA on CPAP   . Postoperative atrial fibrillation ([place]) 06/2017  . Sleep apnea   . Type II diabetes mellitus ([place])    Past Surgical History:  Procedure Laterality Date  . CARDIAC CATHETERIZATION    . COLONOSCOPY  2008  . CORONARY ARTERY BYPASS GRAFT N/A 06/24/2017   Procedure: CORONARY ARTERY BYPASS GRAFTING (CABG) x 3 ; Using Left Internal Mammary Artery and Right Great Saphenous Leg Vein Harvested Endoscopically;  Surgeon: Gaye Pollack, MD;  Location: [facility];  Service: Open Heart Surgery;  Laterality: N/A;  . EYE SURGERY    . LEFT HEART CATH AND CORONARY ANGIOGRAPHY N/A 05/25/2017   Procedure: LEFT HEART CATH AND CORONARY ANGIOGRAPHY;  Surgeon: Wellington Hampshire, MD;  Location: [facility];  Service: Cardiovascular;  Laterality: N/A;  . TEE WITHOUT CARDIOVERSION N/A 06/24/2017   Procedure: TRANSESOPHAGEAL ECHOCARDIOGRAM (TEE);  Surgeon: Gaye Pollack, MD;  Location: [facility];  Service:  Open Heart Surgery;  Laterality: N/A;     A IV Location/Drains/Wounds Patient Lines/Drains/Airways Status   Active Line/Drains/Airways    Name:   Placement date:   Placement time:   Site:   Days:   Peripheral IV 10/19/18 Left Antecubital   10/19/18    1900    Antecubital   less than 1   Incision (Closed) 06/24/17 Sternum Anterior;Mid   06/24/17    1027     482   Incision (Closed) 06/24/17 Leg Right   06/24/17    1027     482   Incision (Closed) 06/24/17 Groin Anterior;Proximal;Right;Upper   06/24/17    1250      482          Intake/Output Last 24 hours  Intake/Output Summary (Last 24 hours) at 10/19/2018 2244 Last data filed at 10/19/2018 1959 Gross per 24 hour  Intake 2.84 ml  Output 220 ml  Net -217.16 ml    Labs/Imaging Results for orders placed or performed during the hospital encounter of 10/19/18 (from the past 48 hour(s))  CBG monitoring, ED     Status: Abnormal   Collection Time: 10/19/18  2:56 PM  Result Value Ref Range   Glucose-Capillary 66 (L) 70 - 99 mg/dL   Comment 1 Notify RN    Comment 2 Document in Chart   Protime-INR     Status: None   Collection Time: 10/19/18  3:00 PM  Result Value Ref Range   Prothrombin Time 12.9 11.4 - 15.2 seconds   INR 1.0 0.8 - 1.2    Comment: (NOTE) INR goal varies based on device and disease states. Performed at [facility], [place] [address] [place], [place] [address]   APTT     Status: Abnormal   Collection Time: 10/19/18  3:00 PM  Result Value Ref Range   aPTT 38 (H) 24 - 36 seconds    Comment:        IF BASELINE aPTT IS ELEVATED, SUGGEST PATIENT RISK ASSESSMENT BE USED TO DETERMINE APPROPRIATE ANTICOAGULANT THERAPY. Performed at [facility], [place] [address], [place] [address]   CBC     Status: None   Collection Time: 10/19/18  3:00 PM  Result Value Ref Range   WBC 5.1 4.0 - 10.5 K/uL   RBC 4.62 4.22 - 5.81 MIL/uL   Hemoglobin 14.0 13.0 - 17.0 g/dL   HCT 42.4 39.0 - 52.0 %   MCV 91.8 80.0 - 100.0 fL   MCH 30.3 26.0 - 34.0 pg   MCHC 33.0 30.0 - 36.0 g/dL   RDW 13.5 11.5 - 15.5 %   Platelets 186 150 - 400 K/uL   nRBC 0.0 0.0 - 0.2 %    Comment: Performed at [facility], [place] [address], [place], [place] [address]  Differential     Status: None   Collection Time: 10/19/18  3:00 PM  Result Value Ref Range   Neutrophils Relative % 36 %   Neutro Abs 1.8 1.7 - 7.7 K/uL   Lymphocytes Relative 52 %   Lymphs Abs 2.7 0.7 - 4.0 K/uL   Monocytes Relative 10 %   Monocytes Absolute 0.5 0.1 - 1.0 K/uL    Eosinophils Relative 2 %   Eosinophils Absolute 0.1 0.0 - 0.5 K/uL   Basophils Relative 0 %   Basophils Absolute 0.0 0.0 - 0.1 K/uL   Immature Granulocytes 0 %   Abs Immature Granulocytes 0.00 0.00 - 0.07 K/uL  Comment: Performed at [facility], [place] [address], [place], [place] [address]  Comprehensive metabolic panel     Status: Abnormal   Collection Time: 10/19/18  3:00 PM  Result Value Ref Range   Sodium 141 135 - 145 mmol/L   Potassium 4.0 3.5 - 5.1 mmol/L   Chloride 104 98 - 111 mmol/L   CO2 25 22 - 32 mmol/L   Glucose, Bld 86 70 - 99 mg/dL   BUN 16 8 - 23 mg/dL   Creatinine, Ser 1.48 (H) 0.61 - 1.24 mg/dL   Calcium 9.0 8.9 - 10.3 mg/dL   Total Protein 7.0 6.5 - 8.1 g/dL   Albumin 4.3 3.5 - 5.0 g/dL   AST 28 15 - 41 U/L   ALT 24 0 - 44 U/L   Alkaline Phosphatase 64 38 - 126 U/L   Total Bilirubin 0.7 0.3 - 1.2 mg/dL   GFR calc non Af Amer 47 (L) >60 mL/min   GFR calc Af Amer 54 (L) >60 mL/min   Anion gap 12 5 - 15    Comment: Performed at [facility] [address], [place], [place] [address]  I-stat Creatinine, ED     Status: Abnormal   Collection Time: 10/19/18  3:13 PM  Result Value Ref Range   Creatinine, Ser 1.50 (H) 0.61 - 1.24 mg/dL  Urine rapid drug screen (hosp performed)     Status: None   Collection Time: 10/19/18  3:44 PM  Result Value Ref Range   Opiates NONE DETECTED NONE DETECTED   Cocaine NONE DETECTED NONE DETECTED   Benzodiazepines NONE DETECTED NONE DETECTED   Amphetamines NONE DETECTED NONE DETECTED   Tetrahydrocannabinol NONE DETECTED NONE DETECTED   Barbiturates NONE DETECTED NONE DETECTED    Comment: (NOTE) DRUG SCREEN FOR MEDICAL PURPOSES ONLY.  IF CONFIRMATION IS NEEDED FOR ANY PURPOSE, NOTIFY LAB WITHIN 5 DAYS. LOWEST DETECTABLE LIMITS FOR URINE DRUG SCREEN Drug Class                     Cutoff (ng/mL) Amphetamine and metabolites    1000 Barbiturate and metabolites    200 Benzodiazepine                  932 Tricyclics and metabolites     300 Opiates and metabolites        300 Cocaine and metabolites        300 THC                            50 Performed at [facility], [place] [address], [place], [place] [address]   Urinalysis, Routine w reflex microscopic     Status: Abnormal   Collection Time: 10/19/18  3:44 PM  Result Value Ref Range   Color, Urine STRAW (A) YELLOW   APPearance CLEAR CLEAR   Specific Gravity, Urine 1.019 1.005 - 1.030   pH 7.0 5.0 - 8.0   Glucose, UA NEGATIVE NEGATIVE mg/dL   Hgb urine dipstick SMALL (A) NEGATIVE   Bilirubin Urine NEGATIVE NEGATIVE   Ketones, ur NEGATIVE NEGATIVE mg/dL   Protein, ur 100 (A) NEGATIVE mg/dL   Nitrite NEGATIVE NEGATIVE   Leukocytes,Ua NEGATIVE NEGATIVE   RBC / HPF 0-5 0 - 5 RBC/hpf   WBC, UA 0-5 0 - 5 WBC/hpf   Bacteria, UA RARE (A) NONE SEEN   Mucus PRESENT    Sperm, UA PRESENT  Comment: Performed at [facility], [place] [address] [place], [place] [address]  Ethanol     Status: None   Collection Time: 10/19/18  3:44 PM  Result Value Ref Range   Alcohol, Ethyl (B) <10 <10 mg/dL    Comment: (NOTE) Lowest detectable limit for serum alcohol is 10 mg/dL. For medical purposes only. Performed at [facility], [place] [address] [place], [place] [address]    Ct Angio Head W Or Wo Contrast  Result Date: 10/19/2018 CLINICAL DATA:  Aphasia, headache.  Stroke EXAM: CT ANGIOGRAPHY HEAD AND NECK TECHNIQUE: Multidetector CT imaging of the head and neck was performed using the standard protocol during bolus administration of intravenous contrast. Multiplanar CT image reconstructions and MIPs were obtained to evaluate the vascular anatomy. Carotid stenosis measurements (when applicable) are obtained utilizing NASCET criteria, using the distal internal carotid diameter as the denominator. CONTRAST:  61mL OMNIPAQUE IOHEXOL 350 MG/ML SOLN COMPARISON:  CT head 10/19/2018 FINDINGS: CTA NECK FINDINGS Aortic arch: Atherosclerotic  aortic arch without aneurysm or dissection. Atherosclerotic calcification proximal great vessels without significant stenosis. Right carotid system: Atherosclerotic calcification throughout the right common carotid artery. Atherosclerotic calcification right carotid bifurcation. Less than 25% diameter stenosis right internal carotid artery proximally Left carotid system: Mild atherosclerotic calcification throughout the left common carotid artery without stenosis. Atherosclerotic disease left carotid bifurcation without significant stenosis. Vertebral arteries: Dominant left vertebral artery. Moderate stenosis at the origin. Left vertebral artery is patent to the basilar without additional stenosis Small and diffusely diseased right vertebral artery without significant contribution to the basilar. Skeleton: Cervical spondylosis. Median sternotomy. No acute skeletal abnormality. Other neck: Negative for mass or adenopathy Upper chest: Lung apices clear bilaterally. Review of the MIP images confirms the above findings CTA HEAD FINDINGS Anterior circulation: Extensive atherosclerotic disease cavernous carotid bilaterally with moderate stenosis bilaterally. Severe stenosis in the superior branch of the right MCA. Inferior branch is patent proximally. No branch occlusion. Mild atherosclerotic disease in the A2 segments bilaterally. Mild stenosis left M1 segment and left M2 segments due to atherosclerotic disease. Posterior circulation: Left vertebral artery supplies the basilar. Right vertebral artery ends in PICA. PICA patent bilaterally. Small caliber basilar without significant stenosis. Fetal origin of the posterior cerebral artery on the right. Both posterior cerebral arteries are patent without significant stenosis. Superior cerebellar artery patent bilaterally. Venous sinuses: Normal venous enhancement. Anatomic variants: None Delayed phase: Not perform Review of the MIP images confirms the above findings  IMPRESSION: 1. Negative for emergent large vessel occlusion 2. Severe diffuse atherosclerotic disease 3. Diffuse atherosclerotic disease in the carotid artery bilaterally. 25% stenosis proximal right carotid artery. Moderate stenosis of the cavernous carotid bilaterally. 4. Small and diffusely disease right vertebral artery ends in PICA. Dominant left vertebral artery with moderate stenosis at the origin 5. Severe stenosis superior branch right MCA. 6. These results were called by telephone at the time of interpretation on 10/19/2018 at 3:42 pm to Dr. Roland Rack , who verbally acknowledged these results. Electronically Signed   By: Franchot Gallo M.D.   On: 10/19/2018 15:43   Ct Angio Neck W Or Wo Contrast  Result Date: 10/19/2018 CLINICAL DATA:  Aphasia, headache.  Stroke EXAM: CT ANGIOGRAPHY HEAD AND NECK TECHNIQUE: Multidetector CT imaging of the head and neck was performed using the standard protocol during bolus administration of intravenous contrast. Multiplanar CT image reconstructions and MIPs were obtained to evaluate the vascular anatomy. Carotid stenosis measurements (when applicable) are obtained utilizing NASCET criteria,  using the distal internal carotid diameter as the denominator. CONTRAST:  22mL OMNIPAQUE IOHEXOL 350 MG/ML SOLN COMPARISON:  CT head 10/19/2018 FINDINGS: CTA NECK FINDINGS Aortic arch: Atherosclerotic aortic arch without aneurysm or dissection. Atherosclerotic calcification proximal great vessels without significant stenosis. Right carotid system: Atherosclerotic calcification throughout the right common carotid artery. Atherosclerotic calcification right carotid bifurcation. Less than 25% diameter stenosis right internal carotid artery proximally Left carotid system: Mild atherosclerotic calcification throughout the left common carotid artery without stenosis. Atherosclerotic disease left carotid bifurcation without significant stenosis. Vertebral arteries: Dominant left  vertebral artery. Moderate stenosis at the origin. Left vertebral artery is patent to the basilar without additional stenosis Small and diffusely diseased right vertebral artery without significant contribution to the basilar. Skeleton: Cervical spondylosis. Median sternotomy. No acute skeletal abnormality. Other neck: Negative for mass or adenopathy Upper chest: Lung apices clear bilaterally. Review of the MIP images confirms the above findings CTA HEAD FINDINGS Anterior circulation: Extensive atherosclerotic disease cavernous carotid bilaterally with moderate stenosis bilaterally. Severe stenosis in the superior branch of the right MCA. Inferior branch is patent proximally. No branch occlusion. Mild atherosclerotic disease in the A2 segments bilaterally. Mild stenosis left M1 segment and left M2 segments due to atherosclerotic disease. Posterior circulation: Left vertebral artery supplies the basilar. Right vertebral artery ends in PICA. PICA patent bilaterally. Small caliber basilar without significant stenosis. Fetal origin of the posterior cerebral artery on the right. Both posterior cerebral arteries are patent without significant stenosis. Superior cerebellar artery patent bilaterally. Venous sinuses: Normal venous enhancement. Anatomic variants: None Delayed phase: Not perform Review of the MIP images confirms the above findings IMPRESSION: 1. Negative for emergent large vessel occlusion 2. Severe diffuse atherosclerotic disease 3. Diffuse atherosclerotic disease in the carotid artery bilaterally. 25% stenosis proximal right carotid artery. Moderate stenosis of the cavernous carotid bilaterally. 4. Small and diffusely disease right vertebral artery ends in PICA. Dominant left vertebral artery with moderate stenosis at the origin 5. Severe stenosis superior branch right MCA. 6. These results were called by telephone at the time of interpretation on 10/19/2018 at 3:42 pm to Dr. Roland Rack , who  verbally acknowledged these results. Electronically Signed   By: Franchot Gallo M.D.   On: 10/19/2018 15:43   Mr Brain Wo Contrast  Result Date: 10/19/2018 CLINICAL DATA:  Headache over the last 2 days. Speech disturbance beginning today. EXAM: MRI HEAD WITHOUT CONTRAST TECHNIQUE: Multiplanar, multiecho pulse sequences of the brain and surrounding structures were obtained without intravenous contrast. COMPARISON:  CT studies same day FINDINGS: Brain: Diffusion imaging does not show any acute or subacute infarction. The brainstem and cerebellum are normal. Cerebral hemispheres show a few punctate foci of T2 and FLAIR signal within the white matter consistent with minimal small vessel change, less than usually seen at this age. No cortical or large vessel territory infarction. No mass lesion, hemorrhage, hydrocephalus or extra-axial collection. Vascular: Major vessels at the base of the brain show flow. Skull and upper cervical spine: Negative Sinuses/Orbits: Clear/normal Other: None IMPRESSION: No cause of the presenting symptoms is identified. No acute or subacute infarction. Normal study for age, with only a few punctate foci of T2 and FLAIR signal in the white matter consistent with minimal small vessel change, less than often seen at this age. Electronically Signed   By: Nelson Chimes M.D.   On: 10/19/2018 17:37   Ct Head Code Stroke Wo Contrast  Result Date: 10/19/2018 CLINICAL DATA:  Code stroke. Aphasia and headache. Last seen  normal 1145 hours EXAM: CT HEAD WITHOUT CONTRAST TECHNIQUE: Contiguous axial images were obtained from the base of the skull through the vertex without intravenous contrast. COMPARISON:  05/31/2013 FINDINGS: Brain: Mild age related volume loss. No evidence of old or acute focal infarction, mass lesion, hemorrhage, hydrocephalus or extra-axial collection. Vascular: There is atherosclerotic calcification of the major vessels at the base of the brain. Skull: Negative Sinuses/Orbits:  Clear/normal Other: None ASPECTS ([place] Stroke Program Early CT Score) - Ganglionic level infarction (caudate, lentiform nuclei, internal capsule, insula, M1-M3 cortex): 7 - Supraganglionic infarction (M4-M6 cortex): 3 Total score (0-10 with 10 being normal): 10 IMPRESSION: 1. Normal for age. Mild generalized atrophy. Atherosclerotic calcification of the major vessels at the base of the brain. 2. ASPECTS is 10. 3. These results were communicated to Dr. Leonel Ramsay at 3:16 pmon 3/26/2020by text page via the [facility] messaging system. Electronically Signed   By: Nelson Chimes M.D.   On: 10/19/2018 15:17    Pending Labs Unresulted Labs (From admission, onward)    Start     Ordered   10/20/18 0500  Comprehensive metabolic panel  Tomorrow morning,   R     10/19/18 2244   10/20/18 0500  CBC WITH DIFFERENTIAL  Tomorrow morning,   R     10/19/18 2244   10/19/18 2243  CBC  (heparin)  Once,   R    Comments:  Baseline for heparin therapy IF NOT ALREADY DRAWN.  Notify MD if PLT < 100 K.    10/19/18 2244   10/19/18 2243  Creatinine, serum  (heparin)  Once,   R    Comments:  Baseline for heparin therapy IF NOT ALREADY DRAWN.    10/19/18 2244          Vitals/Pain Today's Vitals   10/19/18 1930 10/19/18 1945 10/19/18 2000 10/19/18 2020  BP: (!) 192/88 (!) 163/116 (!) 157/87 (!) 188/88  Pulse: 93 93  92  Resp: (!) 24 (!) 25 (!) 23 (!) 23  Temp:      TempSrc:      SpO2: 96% 95%  96%  Weight:      Height:      PainSc:        Isolation Precautions No active isolations  Medications Medications  aspirin EC tablet 81 mg (has no administration in time range)  atorvastatin (LIPITOR) tablet 80 mg (has no administration in time range)  irbesartan (AVAPRO) tablet 150 mg (has no administration in time range)  metoprolol tartrate (LOPRESSOR) tablet 50 mg (has no administration in time range)  acetaminophen (TYLENOL) tablet 650 mg (has no administration in time range)    Or  acetaminophen (TYLENOL)  suppository 650 mg (has no administration in time range)  insulin aspart (novoLOG) injection 0-9 Units (has no administration in time range)  heparin injection 5,000 Units (has no administration in time range)  iohexol (OMNIPAQUE) 350 MG/ML injection 75 mL (75 mLs Intravenous Contrast Given 10/19/18 1525)  LORazepam (ATIVAN) injection 1 mg (1 mg Intravenous Given 10/19/18 1625)  acetaminophen (TYLENOL) tablet 1,000 mg (1,000 mg Oral Given 10/19/18 1801)  labetalol (NORMODYNE,TRANDATE) injection 20 mg (20 mg Intravenous Given 10/19/18 1946)  hydrALAZINE (APRESOLINE) tablet 25 mg (25 mg Oral Given 10/19/18 1945)    Mobility walks Low fall risk   Focused Assessments Neuro Assessment Handoff:  Swallow screen pass? Yes  Cardiac Rhythm: Normal sinus rhythm NIH Stroke Scale ( + Modified Stroke Scale Criteria)  Interval: Shift assessment Level of Consciousness (1a.)   :  Alert, keenly responsive LOC Questions (1b. )   +: Answers both questions correctly LOC Commands (1c. )   + : Performs both tasks correctly Best Gaze (2. )  +: Normal Visual (3. )  +: No visual loss Facial Palsy (4. )    : Normal symmetrical movements Motor Arm, Left (5a. )   +: No drift Motor Arm, Right (5b. )   +: No drift Motor Leg, Left (6a. )   +: No drift Motor Leg, Right (6b. )   +: No drift Limb Ataxia (7. ): Absent Sensory (8. )   +: Normal, no sensory loss Best Language (9. )   +: No aphasia Dysarthria (10. ): Normal Extinction/Inattention (11.)   +: No Abnormality Modified SS Total  +: 0 Complete NIHSS TOTAL: 1 Last date known well: 10/19/18 Last time known well: 1200 Neuro Assessment: Exceptions to WDL Neuro Checks:   Initial (10/19/18 1530)  Last Documented NIHSS Modified Score: 0 (10/19/18 2128) Has TPA been given? No If patient is a Neuro Trauma and patient is going to OR before floor call report to [place] nurse: [phone] or [phone]     R Recommendations: See Admitting Provider  Note  Report given to:   Additional Notes:

## 2018-10-19 NOTE — ED Notes (Signed)
Pt CBG was 66, notified Sarah(RN)

## 2018-10-19 NOTE — Progress Notes (Signed)
MRI negative, this is consistent with mild hypertensive encephalopathy. Would treat as hypertensive emergency per ED and internal medicine. Please call if neurology can be of any further assistance.   Roland Rack, MD [facility] [phone]  If 7pm- 7am, please page neurology on call as listed in [place].

## 2018-10-19 NOTE — ED Notes (Signed)
Pt has returned from MRI. 

## 2018-10-19 NOTE — Code Documentation (Signed)
72 yo male with hx of HTN coming from home with complaints of sudden onset of aphasia. Pt reported being at his baseline at 1200. After that point, he noted some trouble with conversation with daughter and wife. Pt came into ED at 1438 with complaints and was activated as a Code Stroke. Stroke Team met patient in CT. Initial NIHSS 1 due to very mild aphasia. No other focal deficits noted. CT Head negative for hemorrhage. Patient is not a tPA candidate at this time due to too mild to treat. Remains in the tPA window until 1630. CTA completed. Not an IR candidate due to no LVO. Pt's BP noted to be 227/104. 10 mg of Labetalol given at 1510. Repeat BP 216/87. Given 10 mg of Labetalol. Handoff given to Brandi, RN  

## 2018-10-19 NOTE — Progress Notes (Signed)
Received Report from ED RN Patient arrived from ED to 3w20 Introduce to staff and oriented to room VSS; CM notified A/O x 4 Pain 2/10 Headache - PRN given/ scheduled med's given; Held Orders released; Reviewed Care Plan; Physician attending to Patient; Bed in low position; Bed Alarm on Call bell within reach Patient concern about eyeglasses. Eyeglasses was received from ED and given to patient. Will continue to monitor;

## 2018-10-19 NOTE — ED Triage Notes (Signed)
Pt here for evaluation of headache x 2 days and trouble making his words since 12 noon today. Pt sts he knows what he wants to say but is having trouble getting the words.

## 2018-10-19 NOTE — ED Notes (Signed)
Patient transported to MRI 

## 2018-10-19 NOTE — H&P (Signed)
History and Physical    Antonio Wells FXT:[id number] DOB: [birth year] DOA: 10/19/2018  PCP: [name], MD  Patient coming from: Home.  Chief Complaint: Difficulty bringing out words.  HPI: Antonio Wells is a 73 y.o. male with history of CAD status post CABG, hypertension, chronic kidney disease stage III, diabetes mellitus type 2, hyperlipidemia was found by patient's wife that patient was having difficulty bringing out words around afternoon today.  Patient also was found to be confused.  Did not have any fever chills chest pain shortness of breath nausea vomiting or diarrhea.  Later patient did complain of some headache.  Denies any visual symptoms.  Patient does not recall the exact chronology of the incident.  Patient states last week patient's amlodipine was discontinued since patient was having dizziness.  Patient's ARB dose was doubled at that time.  Patient states he has been compliant with his medications.  ED Course: In the ER patient was brought to assess code stroke and had CT head followed by CT angiogram of the head and neck.  CT angiogram of the head and neck did not show any large vessel occlusion.  Followed by MRI brain which did not show any acute.  Patient blood pressure was found to be more than 299 systolic.  Was given IV labetalol hydralazine and patient's ARB.  Following which blood pressure improved patient became more alert awake but at this time neurologist feels patient symptoms are secondary to hypertensive encephalopathy and admitted for further observation.  On my exam patient is alert awake oriented to time place and person moves all extremities.  Blood pressure systolic is around 242 again.  Metoprolol has been ordered and PRN IV labetalol heart has been placed.  Review of Systems: As per HPI, rest all negative.   Past Medical History:  Diagnosis Date  . Arthritis    "knees" (07/11/2017)  . CKD (chronic kidney disease), stage III ([place])   .  Coronary artery disease    a.  CABGx3 in 05/2017.  Marland Kitchen Gout   . Heart murmur   . High cholesterol   . Hypertension   . Mild aortic stenosis   . Mild mitral regurgitation   . OSA on CPAP   . Postoperative atrial fibrillation ([place]) 06/2017  . Sleep apnea   . Type II diabetes mellitus ([place])     Past Surgical History:  Procedure Laterality Date  . CARDIAC CATHETERIZATION    . COLONOSCOPY  2008  . CORONARY ARTERY BYPASS GRAFT N/A 06/24/2017   Procedure: CORONARY ARTERY BYPASS GRAFTING (CABG) x 3 ; Using Left Internal Mammary Artery and Right Great Saphenous Leg Vein Harvested Endoscopically;  Surgeon: Gaye Pollack, MD;  Location: [facility];  Service: Open Heart Surgery;  Laterality: N/A;  . EYE SURGERY    . LEFT HEART CATH AND CORONARY ANGIOGRAPHY N/A 05/25/2017   Procedure: LEFT HEART CATH AND CORONARY ANGIOGRAPHY;  Surgeon: Wellington Hampshire, MD;  Location: [facility];  Service: Cardiovascular;  Laterality: N/A;  . TEE WITHOUT CARDIOVERSION N/A 06/24/2017   Procedure: TRANSESOPHAGEAL ECHOCARDIOGRAM (TEE);  Surgeon: Gaye Pollack, MD;  Location: [facility];  Service: Open Heart Surgery;  Laterality: N/A;     reports that he quit smoking about 20 years ago. His smoking use included cigarettes. He has a 30.00 pack-year smoking history. He has never used smokeless tobacco. He reports that he does not drink alcohol or use drugs.  Allergies  Allergen Reactions  . Penicillins Other (  See Comments)    Has patient had a PCN reaction causing immediate rash, facial/tongue/throat swelling, SOB or lightheadedness with hypotension: No Has patient had a PCN reaction causing severe rash involving mucus membranes or skin necrosis: No Has patient had a PCN reaction that required hospitalization: No Has patient had a PCN reaction occurring within the last 10 years: No If all of the above answers are "NO", then may proceed with Cephalosporin use.   Passed out ? SYNCOPE ?  Marland Kitchen Lisinopril Cough  .  Amiodarone Nausea And Vomiting  . Amlodipine Other (See Comments)    Causes dizzines  . Oxycodone Other (See Comments)    hallucinations  . Zofran [Ondansetron Hcl] Other (See Comments)    Has an interaction with another medication pt takes   . Cortisone Nausea And Vomiting and Other (See Comments)    HICCUPS  . Tramadol Nausea And Vomiting and Other (See Comments)    HICCUPS    Family History  Problem Relation Age of Onset  . Hypertension Mother   . Kidney disease Mother   . Diabetes Mother   . Hypertension Father   . Cancer Brother        lung  . Colon cancer Brother   . Stroke Brother   . Esophageal cancer Neg Hx   . Rectal cancer Neg Hx   . Stomach cancer Neg Hx     Prior to Admission medications   Medication Sig Start Date End Date Taking? Authorizing Provider  acetaminophen (TYLENOL) 325 MG tablet Take 650 mg by mouth every 6 (six) hours as needed for mild pain.   Yes [provider]  aspirin EC 81 MG tablet Take 1 tablet (81 mg total) by mouth daily. 09/02/17  Yes Dunn, Dayna N, PA-C  atorvastatin (LIPITOR) 80 MG tablet Take 1 tablet (80 mg total) by mouth daily at 6 PM. 08/17/18  Yes [name], MD  colchicine 0.6 MG tablet Take 1-2 tablets (0.6-1.2 mg total) by mouth daily as needed (for gout flare). 2 po at onset of flair and then repeat in 1 h for acute gout attack 12/30/17  Yes [name], MD  furosemide (LASIX) 20 MG tablet Take 1 tablet (20 mg total) by mouth daily as needed for fluid or edema. 10/12/18  Yes [name], MD  glipiZIDE (GLUCOTROL) 5 MG tablet Take 1 tablet (5 mg total) by mouth daily before breakfast. 08/14/18  Yes [name], MD  irbesartan (AVAPRO) 150 MG tablet Take 0.5 tablets (75 mg total) by mouth daily. Patient taking differently: Take 150 mg by mouth daily.  10/12/18  Yes [name], MD  metFORMIN (GLUCOPHAGE) 500 MG tablet Take 1 tablet (500 mg total) by mouth 2 (two) times daily with a meal. 08/14/18  Yes  [name], MD  metoprolol tartrate (LOPRESSOR) 50 MG tablet Take 1 tablet (50 mg total) by mouth 2 (two) times daily. 08/17/18  Yes [name], MD    Physical Exam: Vitals:   10/19/18 1930 10/19/18 1945 10/19/18 2000 10/19/18 2020  BP: (!) 192/88 (!) 163/116 (!) 157/87 (!) 188/88  Pulse: 93 93  92  Resp: (!) 24 (!) 25 (!) 23 (!) 23  Temp:      TempSrc:      SpO2: 96% 95%  96%  Weight:      Height:          Constitutional: Moderately built and nourished. Vitals:   10/19/18 1930 10/19/18 1945 10/19/18  2000 10/19/18 2020  BP: (!) 192/88 (!) 163/116 (!) 157/87 (!) 188/88  Pulse: 93 93  92  Resp: (!) 24 (!) 25 (!) 23 (!) 23  Temp:      TempSrc:      SpO2: 96% 95%  96%  Weight:      Height:       Eyes: Anicteric no pallor. ENMT: No discharge from the ears eyes nose and mouth. Neck: No mass felt.  No neck rigidity.  No JVD appreciated. Respiratory: No rhonchi or crepitations. Cardiovascular: S1-S2 heard. Abdomen: Soft nontender bowel sounds present. Musculoskeletal: No edema.  No joint effusion. Skin: No rash. Neurologic: Alert awake oriented to time place and person.  Moves all extremities 5 x 5.  No facial asymmetry tongue is midline pupils equal reacting to light. Psychiatric: Appears normal per normal affect.   Labs on Admission: I have personally reviewed following labs and imaging studies  CBC: Recent Labs  Lab 10/19/18 1500  WBC 5.1  NEUTROABS 1.8  HGB 14.0  HCT 42.4  MCV 91.8  PLT 595   Basic Metabolic Panel: Recent Labs  Lab 10/19/18 1500 10/19/18 1513  NA 141  --   K 4.0  --   CL 104  --   CO2 25  --   GLUCOSE 86  --   BUN 16  --   CREATININE 1.48* 1.50*  CALCIUM 9.0  --    GFR: Estimated Creatinine Clearance: 44.3 mL/min (A) (by C-G formula based on SCr of 1.5 mg/dL (H)). Liver Function Tests: Recent Labs  Lab 10/19/18 1500  AST 28  ALT 24  ALKPHOS 64  BILITOT 0.7  PROT 7.0  ALBUMIN 4.3   No results for input(s):  LIPASE, AMYLASE in the last 168 hours. No results for input(s): AMMONIA in the last 168 hours. Coagulation Profile: Recent Labs  Lab 10/19/18 1500  INR 1.0   Cardiac Enzymes: No results for input(s): CKTOTAL, CKMB, CKMBINDEX, TROPONINI in the last 168 hours. BNP (last 3 results) No results for input(s): PROBNP in the last 8760 hours. HbA1C: No results for input(s): HGBA1C in the last 72 hours. CBG: Recent Labs  Lab 10/19/18 1456  GLUCAP 66*   Lipid Profile: No results for input(s): CHOL, HDL, LDLCALC, TRIG, CHOLHDL, LDLDIRECT in the last 72 hours. Thyroid Function Tests: No results for input(s): TSH, T4TOTAL, FREET4, T3FREE, THYROIDAB in the last 72 hours. Anemia Panel: No results for input(s): VITAMINB12, FOLATE, FERRITIN, TIBC, IRON, RETICCTPCT in the last 72 hours. Urine analysis:    Component Value Date/Time   COLORURINE STRAW (A) 10/19/2018 1544   APPEARANCEUR CLEAR 10/19/2018 1544   LABSPEC 1.019 10/19/2018 1544   PHURINE 7.0 10/19/2018 1544   GLUCOSEU NEGATIVE 10/19/2018 1544   HGBUR SMALL (A) 10/19/2018 1544   BILIRUBINUR NEGATIVE 10/19/2018 1544   BILIRUBINUR neg 02/19/2014 1456   KETONESUR NEGATIVE 10/19/2018 1544   PROTEINUR 100 (A) 10/19/2018 1544   UROBILINOGEN 0.2 02/20/2014 1039   NITRITE NEGATIVE 10/19/2018 1544   LEUKOCYTESUR NEGATIVE 10/19/2018 1544   Sepsis Labs: @LABRCNTIP (procalcitonin:4,lacticidven:4) )No results found for this or any previous visit (from the past 240 hour(s)).   Radiological Exams on Admission: Ct Angio Head W Or Wo Contrast  Result Date: 10/19/2018 CLINICAL DATA:  Aphasia, headache.  Stroke EXAM: CT ANGIOGRAPHY HEAD AND NECK TECHNIQUE: Multidetector CT imaging of the head and neck was performed using the standard protocol during bolus administration of intravenous contrast. Multiplanar CT image reconstructions and MIPs were obtained to evaluate the  vascular anatomy. Carotid stenosis measurements (when applicable) are obtained  utilizing NASCET criteria, using the distal internal carotid diameter as the denominator. CONTRAST:  56mL OMNIPAQUE IOHEXOL 350 MG/ML SOLN COMPARISON:  CT head 10/19/2018 FINDINGS: CTA NECK FINDINGS Aortic arch: Atherosclerotic aortic arch without aneurysm or dissection. Atherosclerotic calcification proximal great vessels without significant stenosis. Right carotid system: Atherosclerotic calcification throughout the right common carotid artery. Atherosclerotic calcification right carotid bifurcation. Less than 25% diameter stenosis right internal carotid artery proximally Left carotid system: Mild atherosclerotic calcification throughout the left common carotid artery without stenosis. Atherosclerotic disease left carotid bifurcation without significant stenosis. Vertebral arteries: Dominant left vertebral artery. Moderate stenosis at the origin. Left vertebral artery is patent to the basilar without additional stenosis Small and diffusely diseased right vertebral artery without significant contribution to the basilar. Skeleton: Cervical spondylosis. Median sternotomy. No acute skeletal abnormality. Other neck: Negative for mass or adenopathy Upper chest: Lung apices clear bilaterally. Review of the MIP images confirms the above findings CTA HEAD FINDINGS Anterior circulation: Extensive atherosclerotic disease cavernous carotid bilaterally with moderate stenosis bilaterally. Severe stenosis in the superior branch of the right MCA. Inferior branch is patent proximally. No branch occlusion. Mild atherosclerotic disease in the A2 segments bilaterally. Mild stenosis left M1 segment and left M2 segments due to atherosclerotic disease. Posterior circulation: Left vertebral artery supplies the basilar. Right vertebral artery ends in PICA. PICA patent bilaterally. Small caliber basilar without significant stenosis. Fetal origin of the posterior cerebral artery on the right. Both posterior cerebral arteries are patent  without significant stenosis. Superior cerebellar artery patent bilaterally. Venous sinuses: Normal venous enhancement. Anatomic variants: None Delayed phase: Not perform Review of the MIP images confirms the above findings IMPRESSION: 1. Negative for emergent large vessel occlusion 2. Severe diffuse atherosclerotic disease 3. Diffuse atherosclerotic disease in the carotid artery bilaterally. 25% stenosis proximal right carotid artery. Moderate stenosis of the cavernous carotid bilaterally. 4. Small and diffusely disease right vertebral artery ends in PICA. Dominant left vertebral artery with moderate stenosis at the origin 5. Severe stenosis superior branch right MCA. 6. These results were called by telephone at the time of interpretation on 10/19/2018 at 3:42 pm to Dr. Roland Rack , who verbally acknowledged these results. Electronically Signed   By: Franchot Gallo M.D.   On: 10/19/2018 15:43   Ct Angio Neck W Or Wo Contrast  Result Date: 10/19/2018 CLINICAL DATA:  Aphasia, headache.  Stroke EXAM: CT ANGIOGRAPHY HEAD AND NECK TECHNIQUE: Multidetector CT imaging of the head and neck was performed using the standard protocol during bolus administration of intravenous contrast. Multiplanar CT image reconstructions and MIPs were obtained to evaluate the vascular anatomy. Carotid stenosis measurements (when applicable) are obtained utilizing NASCET criteria, using the distal internal carotid diameter as the denominator. CONTRAST:  45mL OMNIPAQUE IOHEXOL 350 MG/ML SOLN COMPARISON:  CT head 10/19/2018 FINDINGS: CTA NECK FINDINGS Aortic arch: Atherosclerotic aortic arch without aneurysm or dissection. Atherosclerotic calcification proximal great vessels without significant stenosis. Right carotid system: Atherosclerotic calcification throughout the right common carotid artery. Atherosclerotic calcification right carotid bifurcation. Less than 25% diameter stenosis right internal carotid artery proximally Left  carotid system: Mild atherosclerotic calcification throughout the left common carotid artery without stenosis. Atherosclerotic disease left carotid bifurcation without significant stenosis. Vertebral arteries: Dominant left vertebral artery. Moderate stenosis at the origin. Left vertebral artery is patent to the basilar without additional stenosis Small and diffusely diseased right vertebral artery without significant contribution to the basilar. Skeleton: Cervical spondylosis. Median sternotomy. No  acute skeletal abnormality. Other neck: Negative for mass or adenopathy Upper chest: Lung apices clear bilaterally. Review of the MIP images confirms the above findings CTA HEAD FINDINGS Anterior circulation: Extensive atherosclerotic disease cavernous carotid bilaterally with moderate stenosis bilaterally. Severe stenosis in the superior branch of the right MCA. Inferior branch is patent proximally. No branch occlusion. Mild atherosclerotic disease in the A2 segments bilaterally. Mild stenosis left M1 segment and left M2 segments due to atherosclerotic disease. Posterior circulation: Left vertebral artery supplies the basilar. Right vertebral artery ends in PICA. PICA patent bilaterally. Small caliber basilar without significant stenosis. Fetal origin of the posterior cerebral artery on the right. Both posterior cerebral arteries are patent without significant stenosis. Superior cerebellar artery patent bilaterally. Venous sinuses: Normal venous enhancement. Anatomic variants: None Delayed phase: Not perform Review of the MIP images confirms the above findings IMPRESSION: 1. Negative for emergent large vessel occlusion 2. Severe diffuse atherosclerotic disease 3. Diffuse atherosclerotic disease in the carotid artery bilaterally. 25% stenosis proximal right carotid artery. Moderate stenosis of the cavernous carotid bilaterally. 4. Small and diffusely disease right vertebral artery ends in PICA. Dominant left vertebral  artery with moderate stenosis at the origin 5. Severe stenosis superior branch right MCA. 6. These results were called by telephone at the time of interpretation on 10/19/2018 at 3:42 pm to Dr. Roland Rack , who verbally acknowledged these results. Electronically Signed   By: Franchot Gallo M.D.   On: 10/19/2018 15:43   Mr Brain Wo Contrast  Result Date: 10/19/2018 CLINICAL DATA:  Headache over the last 2 days. Speech disturbance beginning today. EXAM: MRI HEAD WITHOUT CONTRAST TECHNIQUE: Multiplanar, multiecho pulse sequences of the brain and surrounding structures were obtained without intravenous contrast. COMPARISON:  CT studies same day FINDINGS: Brain: Diffusion imaging does not show any acute or subacute infarction. The brainstem and cerebellum are normal. Cerebral hemispheres show a few punctate foci of T2 and FLAIR signal within the white matter consistent with minimal small vessel change, less than usually seen at this age. No cortical or large vessel territory infarction. No mass lesion, hemorrhage, hydrocephalus or extra-axial collection. Vascular: Major vessels at the base of the brain show flow. Skull and upper cervical spine: Negative Sinuses/Orbits: Clear/normal Other: None IMPRESSION: No cause of the presenting symptoms is identified. No acute or subacute infarction. Normal study for age, with only a few punctate foci of T2 and FLAIR signal in the white matter consistent with minimal small vessel change, less than often seen at this age. Electronically Signed   By: Nelson Chimes M.D.   On: 10/19/2018 17:37   Ct Head Code Stroke Wo Contrast  Result Date: 10/19/2018 CLINICAL DATA:  Code stroke. Aphasia and headache. Last seen normal 1145 hours EXAM: CT HEAD WITHOUT CONTRAST TECHNIQUE: Contiguous axial images were obtained from the base of the skull through the vertex without intravenous contrast. COMPARISON:  05/31/2013 FINDINGS: Brain: Mild age related volume loss. No evidence of old or  acute focal infarction, mass lesion, hemorrhage, hydrocephalus or extra-axial collection. Vascular: There is atherosclerotic calcification of the major vessels at the base of the brain. Skull: Negative Sinuses/Orbits: Clear/normal Other: None ASPECTS ([place] Stroke Program Early CT Score) - Ganglionic level infarction (caudate, lentiform nuclei, internal capsule, insula, M1-M3 cortex): 7 - Supraganglionic infarction (M4-M6 cortex): 3 Total score (0-10 with 10 being normal): 10 IMPRESSION: 1. Normal for age. Mild generalized atrophy. Atherosclerotic calcification of the major vessels at the base of the brain. 2. ASPECTS is 10. 3. These  results were communicated to Dr. Leonel Ramsay at 3:16 pmon 3/26/2020by text page via the [facility] messaging system. Electronically Signed   By: Nelson Chimes M.D.   On: 10/19/2018 15:17    EKG: Independently reviewed.  Normal sinus rhythm.  Assessment/Plan Principal Problem:   Hypertensive encephalopathy Active Problems:   OSA on CPAP   S/P CABG x 3   Chronic kidney disease, stage 3 ([facility])   DM (diabetes mellitus), type 2 with renal complications ([place])    1. Hypertensive encephalopathy -symptoms have improved with control of blood pressure.  Patient is on metoprolol and ARB.  If blood pressure does not improve may add hydralazine.  PRN IV labetalol for now.  Closely follow blood pressure trends. 2. CAD status post CABG denies any chest pain.  On aspirin metoprolol and statin. 3. Diabetes mellitus type 2 we will keep patient on sliding scale coverage. 4. Chronic kidney disease stage III creatinine appears to be at baseline been ordered patient is on ARB. 5. Sleep apnea on CPAP.   DVT prophylaxis: Heparin. Code Status: Full code. Family Communication: Discussed with patient. Disposition Plan: Home. Consults called: Neurology. Admission status: Observation.   Rise Patience MD [facility] Pager [phone].  If 7PM-7AM, please contact  night-coverage [email or web address] Password [facility]  10/19/2018, 10:44 PM

## 2018-10-19 NOTE — ED Provider Notes (Signed)
Emergency Department Provider Note   I have reviewed the triage vital signs and the nursing notes.   HISTORY  Chief Complaint Headache and Aphasia   HPI Antonio Wells is a 73 y.o. male with multiple medical problems who presents the emergency department today with difficulty speaking.  Patient states he had a headache for approximately 4 days and had some right hand paresthesias during that time as well.  Around noon today nurses have difficulty finding words and this persisted so presented here for further evaluation.  No other neurologic symptoms at this time.  No recent illnesses.  No history of stroke but does have history of CABG back in 2018.  States he has not missed any of his antihypertensives.   Limited history secondary to acuity of situation level 5 caveat applies Past Medical History:  Diagnosis Date  . Arthritis    "knees" (07/11/2017)  . CKD (chronic kidney disease), stage III ([place])   . Coronary artery disease    a.  CABGx3 in 05/2017.  Marland Kitchen Gout   . Heart murmur   . High cholesterol   . Hypertension   . Mild aortic stenosis   . Mild mitral regurgitation   . OSA on CPAP   . Postoperative atrial fibrillation ([place]) 06/2017  . Sleep apnea   . Type II diabetes mellitus [facility])     Patient Active Problem List   Diagnosis Date Noted  . Cough 03/17/2018  . Lower resp. tract infection 03/17/2018  . Sleep apnea with use of continuous positive airway pressure (CPAP) 01/04/2018  . Malnutrition of moderate degree 07/14/2017  . Thrush 07/11/2017  . FTT (failure to thrive) in adult 07/11/2017  . 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 CAD (coronary artery disease) 05/25/2017  . Unstable angina ([place])   . Hypertensive heart disease 07/14/2015  . Lower extremity edema 07/14/2015  . Aortic stenosis 01/09/2015  . OSA on CPAP 12/12/2014  . Hypersomnia with sleep apnea 12/12/2014  . Obstructive sleep apnea (adult) (pediatric) 02/04/2014  . 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  Obesity 02/04/2014  . Mitral regurgitation and aortic stenosis 01/02/2014  . DOE (dyspnea on exertion) 11/26/2013  . Gout 08/12/2011  . Hypertension 08/12/2011  . High cholesterol 08/12/2011  . OSA (obstructive sleep apnea) 08/12/2011  . Diabetes type 2, uncontrolled ([place]) 08/12/2011    Past Surgical History:  Procedure Laterality Date  . CARDIAC CATHETERIZATION    . COLONOSCOPY  2008  . CORONARY ARTERY BYPASS GRAFT N/A 06/24/2017   Procedure: CORONARY ARTERY BYPASS GRAFTING (CABG) x 3 ; Using Left Internal Mammary Artery and Right Great Saphenous Leg Vein Harvested Endoscopically;  Surgeon: Gaye Pollack, MD;  Location: [facility];  Service: Open Heart Surgery;  Laterality: N/A;  . EYE SURGERY    . LEFT HEART CATH AND CORONARY ANGIOGRAPHY N/A 05/25/2017   Procedure: LEFT HEART CATH AND CORONARY ANGIOGRAPHY;  Surgeon: Wellington Hampshire, MD;  Location: [facility];  Service: Cardiovascular;  Laterality: N/A;  . TEE WITHOUT CARDIOVERSION N/A 06/24/2017   Procedure: TRANSESOPHAGEAL ECHOCARDIOGRAM (TEE);  Surgeon: Gaye Pollack, MD;  Location: [facility];  Service: Open Heart Surgery;  Laterality: N/A;    Current Outpatient Rx  . Order #: [id number] Class: Historical Med  . Order #: [id number] Class: No Print  . Order #: [id number] Class: Normal  . Order #: [id number] Class: Print  . Order #: [id number] Class: Normal  . Order #: [id number] Class: Normal  . Order #: [id number] Class: Normal  . Order #: [id number] Class:  Normal  . Order #: [id number] Class: Normal    Allergies Penicillins; Lisinopril; Amiodarone; Amlodipine; Oxycodone; Zofran [ondansetron hcl]; Cortisone; and Tramadol  Family History  Problem Relation Age of Onset  . Hypertension Mother   . Kidney disease Mother   . Diabetes Mother   . Hypertension Father   . Cancer Brother        lung  . Colon cancer Brother   . Stroke Brother   . Esophageal cancer Neg Hx   . Rectal cancer Neg Hx   . Stomach cancer Neg Hx     Social  History Social History   Tobacco Use  . Smoking status: Former Smoker    Packs/day: 2.00    Years: 15.00    Pack years: 30.00    Types: Cigarettes    Last attempt to quit: 07/26/1998    Years since quitting: 20.2  . Smokeless tobacco: Never Used  Substance Use Topics  . Alcohol use: No    Alcohol/week: 0.0 standard drinks  . Drug use: No    Review of Systems  Limited history secondary to acuity of situation level 5 caveat applies ____________________________________________   PHYSICAL EXAM:  VITAL SIGNS: ED Triage Vitals  Enc Vitals Group     BP 10/19/18 1447 (!) 233/96     Pulse Rate 10/19/18 1447 76     Resp 10/19/18 1447 16     Temp 10/19/18 1447 98.9 F (37.2 C)     Temp Source 10/19/18 1447 Oral     SpO2 10/19/18 1447 99 %     Weight 10/19/18 1445 194 lb (88 kg)     Height 10/19/18 1445 5\' 3"  (1.6 m)    Constitutional: Alert and oriented. Well appearing and in no acute distress. Eyes: Conjunctivae are normal. PERRL. EOMI. Head: Atraumatic. Nose: No congestion/rhinnorhea. Mouth/Throat: Mucous membranes are moist.  Oropharynx non-erythematous. Neck: No stridor.  No meningeal signs.   Cardiovascular: Normal rate, regular rhythm. Good peripheral circulation. Grossly normal heart sounds.   Respiratory: Normal respiratory effort.  No retractions. Lungs CTAB. Gastrointestinal: Soft and nontender. No distention.  Musculoskeletal: No lower extremity tenderness nor edema. No gross deformities of extremities. Neurologic:  Some delayed speech and mild word finding difficulties.  Upper extremities are symmetric in strength and sensation, lower extremities are symmetric in strength and sensation and no obvious cranial nerve deficits deferred evaluation of 1&2.  No other gross focal neurologic deficits are appreciated.  Skin:  Skin is warm, dry and intact. No rash noted.   ____________________________________________   LABS (all labs ordered are listed, but only abnormal  results are displayed)  Labs Reviewed  APTT - Abnormal; Notable for the following components:      Result Value   aPTT 38 (*)    All other components within normal limits  COMPREHENSIVE METABOLIC PANEL - Abnormal; Notable for the following components:   Creatinine, Ser 1.48 (*)    GFR calc non Af Amer 47 (*)    GFR calc Af Amer 54 (*)    All other components within normal limits  URINALYSIS, ROUTINE W REFLEX MICROSCOPIC - Abnormal; Notable for the following components:   Color, Urine STRAW (*)    Hgb urine dipstick SMALL (*)    Protein, ur 100 (*)    Bacteria, UA RARE (*)    All other components within normal limits  I-STAT CREATININE, ED - Abnormal; Notable for the following components:   Creatinine, Ser 1.50 (*)    All other components within normal  limits  CBG MONITORING, ED - Abnormal; Notable for the following components:   Glucose-Capillary 66 (*)    All other components within normal limits  PROTIME-INR  CBC  DIFFERENTIAL  RAPID URINE DRUG SCREEN, HOSP PERFORMED  ETHANOL   ____________________________________________  EKG   EKG Interpretation  Date/Time:  Thursday October 19 2018 14:45:34 EDT Ventricular Rate:  77 PR Interval:    QRS Duration: 89 QT Interval:  383 QTC Calculation: 434 R Axis:   -18 Text Interpretation:  Sinus rhythm Left atrial enlargement Borderline left axis deviation RSR' in V1 or V2, probably normal variant Borderline T wave abnormalities No significant change since last tracing Confirmed by Merrily Pew [phone]) on 10/19/2018 8:54:56 PM       ____________________________________________  RADIOLOGY  Ct Angio Head W Or Wo Contrast  Result Date: 10/19/2018 CLINICAL DATA:  Aphasia, headache.  Stroke EXAM: CT ANGIOGRAPHY HEAD AND NECK TECHNIQUE: Multidetector CT imaging of the head and neck was performed using the standard protocol during bolus administration of intravenous contrast. Multiplanar CT image reconstructions and MIPs were obtained  to evaluate the vascular anatomy. Carotid stenosis measurements (when applicable) are obtained utilizing NASCET criteria, using the distal internal carotid diameter as the denominator. CONTRAST:  11mL OMNIPAQUE IOHEXOL 350 MG/ML SOLN COMPARISON:  CT head 10/19/2018 FINDINGS: CTA NECK FINDINGS Aortic arch: Atherosclerotic aortic arch without aneurysm or dissection. Atherosclerotic calcification proximal great vessels without significant stenosis. Right carotid system: Atherosclerotic calcification throughout the right common carotid artery. Atherosclerotic calcification right carotid bifurcation. Less than 25% diameter stenosis right internal carotid artery proximally Left carotid system: Mild atherosclerotic calcification throughout the left common carotid artery without stenosis. Atherosclerotic disease left carotid bifurcation without significant stenosis. Vertebral arteries: Dominant left vertebral artery. Moderate stenosis at the origin. Left vertebral artery is patent to the basilar without additional stenosis Small and diffusely diseased right vertebral artery without significant contribution to the basilar. Skeleton: Cervical spondylosis. Median sternotomy. No acute skeletal abnormality. Other neck: Negative for mass or adenopathy Upper chest: Lung apices clear bilaterally. Review of the MIP images confirms the above findings CTA HEAD FINDINGS Anterior circulation: Extensive atherosclerotic disease cavernous carotid bilaterally with moderate stenosis bilaterally. Severe stenosis in the superior branch of the right MCA. Inferior branch is patent proximally. No branch occlusion. Mild atherosclerotic disease in the A2 segments bilaterally. Mild stenosis left M1 segment and left M2 segments due to atherosclerotic disease. Posterior circulation: Left vertebral artery supplies the basilar. Right vertebral artery ends in PICA. PICA patent bilaterally. Small caliber basilar without significant stenosis. Fetal origin  of the posterior cerebral artery on the right. Both posterior cerebral arteries are patent without significant stenosis. Superior cerebellar artery patent bilaterally. Venous sinuses: Normal venous enhancement. Anatomic variants: None Delayed phase: Not perform Review of the MIP images confirms the above findings IMPRESSION: 1. Negative for emergent large vessel occlusion 2. Severe diffuse atherosclerotic disease 3. Diffuse atherosclerotic disease in the carotid artery bilaterally. 25% stenosis proximal right carotid artery. Moderate stenosis of the cavernous carotid bilaterally. 4. Small and diffusely disease right vertebral artery ends in PICA. Dominant left vertebral artery with moderate stenosis at the origin 5. Severe stenosis superior branch right MCA. 6. These results were called by telephone at the time of interpretation on 10/19/2018 at 3:42 pm to Dr. Roland Rack , who verbally acknowledged these results. Electronically Signed   By: Franchot Gallo M.D.   On: 10/19/2018 15:43   Ct Angio Neck W Or Wo Contrast  Result Date: 10/19/2018 CLINICAL DATA:  Aphasia, headache.  Stroke EXAM: CT ANGIOGRAPHY HEAD AND NECK TECHNIQUE: Multidetector CT imaging of the head and neck was performed using the standard protocol during bolus administration of intravenous contrast. Multiplanar CT image reconstructions and MIPs were obtained to evaluate the vascular anatomy. Carotid stenosis measurements (when applicable) are obtained utilizing NASCET criteria, using the distal internal carotid diameter as the denominator. CONTRAST:  59mL OMNIPAQUE IOHEXOL 350 MG/ML SOLN COMPARISON:  CT head 10/19/2018 FINDINGS: CTA NECK FINDINGS Aortic arch: Atherosclerotic aortic arch without aneurysm or dissection. Atherosclerotic calcification proximal great vessels without significant stenosis. Right carotid system: Atherosclerotic calcification throughout the right common carotid artery. Atherosclerotic calcification right carotid  bifurcation. Less than 25% diameter stenosis right internal carotid artery proximally Left carotid system: Mild atherosclerotic calcification throughout the left common carotid artery without stenosis. Atherosclerotic disease left carotid bifurcation without significant stenosis. Vertebral arteries: Dominant left vertebral artery. Moderate stenosis at the origin. Left vertebral artery is patent to the basilar without additional stenosis Small and diffusely diseased right vertebral artery without significant contribution to the basilar. Skeleton: Cervical spondylosis. Median sternotomy. No acute skeletal abnormality. Other neck: Negative for mass or adenopathy Upper chest: Lung apices clear bilaterally. Review of the MIP images confirms the above findings CTA HEAD FINDINGS Anterior circulation: Extensive atherosclerotic disease cavernous carotid bilaterally with moderate stenosis bilaterally. Severe stenosis in the superior branch of the right MCA. Inferior branch is patent proximally. No branch occlusion. Mild atherosclerotic disease in the A2 segments bilaterally. Mild stenosis left M1 segment and left M2 segments due to atherosclerotic disease. Posterior circulation: Left vertebral artery supplies the basilar. Right vertebral artery ends in PICA. PICA patent bilaterally. Small caliber basilar without significant stenosis. Fetal origin of the posterior cerebral artery on the right. Both posterior cerebral arteries are patent without significant stenosis. Superior cerebellar artery patent bilaterally. Venous sinuses: Normal venous enhancement. Anatomic variants: None Delayed phase: Not perform Review of the MIP images confirms the above findings IMPRESSION: 1. Negative for emergent large vessel occlusion 2. Severe diffuse atherosclerotic disease 3. Diffuse atherosclerotic disease in the carotid artery bilaterally. 25% stenosis proximal right carotid artery. Moderate stenosis of the cavernous carotid bilaterally. 4.  Small and diffusely disease right vertebral artery ends in PICA. Dominant left vertebral artery with moderate stenosis at the origin 5. Severe stenosis superior branch right MCA. 6. These results were called by telephone at the time of interpretation on 10/19/2018 at 3:42 pm to Dr. Roland Rack , who verbally acknowledged these results. Electronically Signed   By: Franchot Gallo M.D.   On: 10/19/2018 15:43   Mr Brain Wo Contrast  Result Date: 10/19/2018 CLINICAL DATA:  Headache over the last 2 days. Speech disturbance beginning today. EXAM: MRI HEAD WITHOUT CONTRAST TECHNIQUE: Multiplanar, multiecho pulse sequences of the brain and surrounding structures were obtained without intravenous contrast. COMPARISON:  CT studies same day FINDINGS: Brain: Diffusion imaging does not show any acute or subacute infarction. The brainstem and cerebellum are normal. Cerebral hemispheres show a few punctate foci of T2 and FLAIR signal within the white matter consistent with minimal small vessel change, less than usually seen at this age. No cortical or large vessel territory infarction. No mass lesion, hemorrhage, hydrocephalus or extra-axial collection. Vascular: Major vessels at the base of the brain show flow. Skull and upper cervical spine: Negative Sinuses/Orbits: Clear/normal Other: None IMPRESSION: No cause of the presenting symptoms is identified. No acute or subacute infarction. Normal study for age, with only a few punctate foci of T2 and FLAIR  signal in the white matter consistent with minimal small vessel change, less than often seen at this age. Electronically Signed   By: Nelson Chimes M.D.   On: 10/19/2018 17:37   Ct Head Code Stroke Wo Contrast  Result Date: 10/19/2018 CLINICAL DATA:  Code stroke. Aphasia and headache. Last seen normal 1145 hours EXAM: CT HEAD WITHOUT CONTRAST TECHNIQUE: Contiguous axial images were obtained from the base of the skull through the vertex without intravenous contrast.  COMPARISON:  05/31/2013 FINDINGS: Brain: Mild age related volume loss. No evidence of old or acute focal infarction, mass lesion, hemorrhage, hydrocephalus or extra-axial collection. Vascular: There is atherosclerotic calcification of the major vessels at the base of the brain. Skull: Negative Sinuses/Orbits: Clear/normal Other: None ASPECTS ([place] Stroke Program Early CT Score) - Ganglionic level infarction (caudate, lentiform nuclei, internal capsule, insula, M1-M3 cortex): 7 - Supraganglionic infarction (M4-M6 cortex): 3 Total score (0-10 with 10 being normal): 10 IMPRESSION: 1. Normal for age. Mild generalized atrophy. Atherosclerotic calcification of the major vessels at the base of the brain. 2. ASPECTS is 10. 3. These results were communicated to Dr. Leonel Ramsay at 3:16 pmon 3/26/2020by text page via the [facility] messaging system. Electronically Signed   By: Nelson Chimes M.D.   On: 10/19/2018 15:17    ____________________________________________   PROCEDURES  Procedure(s) performed:   Procedures  CRITICAL CARE Performed by: Merrily Pew Total critical care time: 35 minutes Critical care time was exclusive of separately billable procedures and treating other patients. Critical care was necessary to treat or prevent imminent or life-threatening deterioration. Critical care was time spent personally by me on the following activities: development of treatment plan with patient and/or surrogate as well as nursing, discussions with consultants, evaluation of patient's response to treatment, examination of patient, obtaining history from patient or surrogate, ordering and performing treatments and interventions, ordering and review of laboratory studies, ordering and review of radiographic studies, pulse oximetry and re-evaluation of patient's condition.  ____________________________________________   INITIAL IMPRESSION / ASSESSMENT AND PLAN / ED COURSE  Stroke versus hypertensive crisis.   Will get CT scans first and then consider lowering blood pressure.  Work-up negative for any acute findings on CT and MRI.  Will institute hypertensive treatments.  Did not get better with labetalol 10 mg x 2 will give home medications and start nicardipine and discussed with critical care.  [place] care recommends to hold the nicardipine at this time given 20 of labetalol and 25 of p.o. hydralazine to try to avoid ICU admission.  Above medications did seem to help with his blood pressure and his headache.  As low as 150s to 160s.  Will discuss with hospitalist for admission.  Unassigned paged.  Discussed with Dr. Hal Hope who will admit.     Pertinent labs & imaging results that were available during my care of the patient were reviewed by me and considered in my medical decision making (see chart for details).  ____________________________________________  FINAL CLINICAL IMPRESSION(S) / ED DIAGNOSES  Final diagnoses:  Hypertensive crisis     MEDICATIONS GIVEN DURING THIS VISIT:  Medications  labetalol (NORMODYNE,TRANDATE) injection 10 mg (10 mg Intravenous Given 10/19/18 1802)  irbesartan (AVAPRO) tablet 150 mg (has no administration in time range)  iohexol (OMNIPAQUE) 350 MG/ML injection 75 mL (75 mLs Intravenous Contrast Given 10/19/18 1525)  LORazepam (ATIVAN) injection 1 mg (1 mg Intravenous Given 10/19/18 1625)  acetaminophen (TYLENOL) tablet 1,000 mg (1,000 mg Oral Given 10/19/18 1801)  labetalol (NORMODYNE,TRANDATE) injection 20 mg (20  mg Intravenous Given 10/19/18 1946)  hydrALAZINE (APRESOLINE) tablet 25 mg (25 mg Oral Given 10/19/18 1945)     NEW OUTPATIENT MEDICATIONS STARTED DURING THIS VISIT:  New Prescriptions   No medications on file    Note:  This note was prepared with assistance of Dragon voice recognition software. Occasional wrong-word or sound-a-like substitutions may have occurred due to the inherent limitations of voice recognition software.    Merrily Pew, MD 10/19/18 2055

## 2018-10-19 NOTE — ED Notes (Signed)
Pt ambulatory and walking in room. Was asked to call for any assistance to prevent a fall.

## 2018-10-20 DIAGNOSIS — N183 Chronic kidney disease, stage 3 (moderate): Secondary | ICD-10-CM | POA: Diagnosis not present

## 2018-10-20 DIAGNOSIS — I674 Hypertensive encephalopathy: Secondary | ICD-10-CM | POA: Diagnosis not present

## 2018-10-20 LAB — COMPREHENSIVE METABOLIC PANEL
ALBUMIN: 3.9 g/dL (ref 3.5–5.0)
ALT: 22 U/L (ref 0–44)
AST: 22 U/L (ref 15–41)
Alkaline Phosphatase: 65 U/L (ref 38–126)
Anion gap: 8 (ref 5–15)
BUN: 14 mg/dL (ref 8–23)
CO2: 27 mmol/L (ref 22–32)
Calcium: 8.9 mg/dL (ref 8.9–10.3)
Chloride: 106 mmol/L (ref 98–111)
Creatinine, Ser: 1.5 mg/dL — ABNORMAL HIGH (ref 0.61–1.24)
GFR calc Af Amer: 54 mL/min — ABNORMAL LOW (ref >60)
GFR calc non Af Amer: 46 mL/min — ABNORMAL LOW (ref >60)
GLUCOSE: 119 mg/dL — AB (ref 70–99)
Potassium: 3.6 mmol/L (ref 3.5–5.1)
SODIUM: 141 mmol/L (ref 135–145)
Total Bilirubin: 0.8 mg/dL (ref 0.3–1.2)
Total Protein: 6.6 g/dL (ref 6.5–8.1)

## 2018-10-20 LAB — GLUCOSE, CAPILLARY
GLUCOSE-CAPILLARY: 153 mg/dL — AB (ref 70–99)
Glucose-Capillary: 131 mg/dL — ABNORMAL HIGH (ref 70–99)
Glucose-Capillary: 170 mg/dL — ABNORMAL HIGH (ref 70–99)
Glucose-Capillary: 82 mg/dL (ref 70–99)

## 2018-10-20 LAB — CBC WITH DIFFERENTIAL/PLATELET
Abs Immature Granulocytes: 0.01 10*3/uL (ref 0.00–0.07)
Basophils Absolute: 0 10*3/uL (ref 0.0–0.1)
Basophils Relative: 0 %
Eosinophils Absolute: 0 10*3/uL (ref 0.0–0.5)
Eosinophils Relative: 1 %
HCT: 38.5 % — ABNORMAL LOW (ref 39.0–52.0)
Hemoglobin: 13.6 g/dL (ref 13.0–17.0)
Immature Granulocytes: 0 %
Lymphocytes Relative: 47 %
Lymphs Abs: 2.9 10*3/uL (ref 0.7–4.0)
MCH: 31.7 pg (ref 26.0–34.0)
MCHC: 35.3 g/dL (ref 30.0–36.0)
MCV: 89.7 fL (ref 80.0–100.0)
Monocytes Absolute: 0.5 10*3/uL (ref 0.1–1.0)
Monocytes Relative: 9 %
Neutro Abs: 2.6 10*3/uL (ref 1.7–7.7)
Neutrophils Relative %: 43 %
Platelets: 177 10*3/uL (ref 150–400)
RBC: 4.29 MIL/uL (ref 4.22–5.81)
RDW: 13.4 % (ref 11.5–15.5)
WBC: 6.1 10*3/uL (ref 4.0–10.5)
nRBC: 0 % (ref 0.0–0.2)

## 2018-10-20 MED ORDER — HYDRALAZINE HCL 25 MG PO TABS
25.0000 mg | ORAL_TABLET | Freq: Three times a day (TID) | ORAL | Status: DC
  Administered 2018-10-20 (×3): 25 mg via ORAL
  Filled 2018-10-20 (×3): qty 1

## 2018-10-20 MED ORDER — HYDRALAZINE HCL 50 MG PO TABS
50.0000 mg | ORAL_TABLET | Freq: Three times a day (TID) | ORAL | Status: DC
  Administered 2018-10-20 – 2018-10-21 (×3): 50 mg via ORAL
  Filled 2018-10-20 (×3): qty 1

## 2018-10-20 MED ORDER — FENTANYL CITRATE (PF) 100 MCG/2ML IJ SOLN
12.5000 ug | INTRAMUSCULAR | Status: DC | PRN
  Administered 2018-10-20 – 2018-10-21 (×3): 12.5 ug via INTRAVENOUS
  Filled 2018-10-20 (×3): qty 2

## 2018-10-20 NOTE — Progress Notes (Signed)
Pt states that he does not want to use hospital CPAP.  Pt informed that a unit is available if he changes his mind.

## 2018-10-20 NOTE — TOC Initial Note (Signed)
Transition of Care [facility]) - Initial/Assessment Note    Patient Details  Name: Antonio Wells MRN: [id number] Date of Birth: Sep 22, 1945  [facility] of [facility] CM/SW Contact:    Pollie Friar, RN Phone Number: 10/20/2018, 2:48 PM  Clinical Narrative:                 Plan is for home when medically ready. CM following.  Expected Discharge Plan: Home/Self Care Barriers to Discharge: Continued Medical Work up   Patient Goals and CMS Choice        Expected Discharge Plan and Services Expected Discharge Plan: Home/Self Care       Living arrangements for the past 2 months: Single Family Home(2 story)                          Prior Living Arrangements/Services Living arrangements for the past 2 months: Single Family Home(2 story) Lives with:: Spouse Patient language and need for interpreter reviewed:: Yes(no needs) Do you feel safe going back to the place where you live?: Yes      Need for Family Participation in Patient Care: No (Comment)(no recommendations) Care giver support system in place?: Yes (comment)(wife able to provide supervision) Current home services: DME(walker) Criminal Activity/Legal Involvement Pertinent to Current Situation/Hospitalization: No - Comment as needed  Activities of Daily Living Home Assistive Devices/Equipment: None ADL Screening (condition at time of admission) Patient's cognitive ability adequate to safely complete daily activities?: Yes Is the patient deaf or have difficulty hearing?: No Does the patient have difficulty seeing, even when wearing glasses/contacts?: No Does the patient have difficulty concentrating, remembering, or making decisions?: No Patient able to express need for assistance with ADLs?: Yes Does the patient have difficulty dressing or bathing?: No Independently performs ADLs?: Yes (appropriate for developmental age) Does the patient have difficulty walking or climbing stairs?: No Weakness of Legs:  None Weakness of Arms/Hands: None  Permission Sought/Granted                  Emotional Assessment Appearance:: Appears stated age Attitude/Demeanor/Rapport: Engaged Affect (typically observed): Accepting, Pleasant, Appropriate Orientation: : Oriented to Self, Oriented to Place, Oriented to  Time, Oriented to Situation   Psych Involvement: No (comment)  Admission diagnosis:  Hypertensive crisis [I16.9] Hypertensive encephalopathy [I67.4] Patient Active Problem List   Diagnosis Date Noted  . Hypertensive encephalopathy 10/19/2018  . Chronic kidney disease, stage 3 ([place]) 10/19/2018  . DM (diabetes mellitus), type 2 with renal complications ([place]) 71/69/6789  . Cough 03/17/2018  . Lower resp. tract infection 03/17/2018  . Sleep apnea with use of continuous positive airway pressure (CPAP) 01/04/2018  . Malnutrition of moderate degree 07/14/2017  . Thrush 07/11/2017  . FTT (failure to thrive) in adult 07/11/2017  . 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 CAD (coronary artery disease) 05/25/2017  . Unstable angina ([place])   . Hypertensive heart disease 07/14/2015  . Lower extremity edema 07/14/2015  . Aortic stenosis 01/09/2015  . OSA on CPAP 12/12/2014  . Hypersomnia with sleep apnea 12/12/2014  . Obstructive sleep apnea (adult) (pediatric) 02/04/2014  . 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 Obesity 02/04/2014  . Mitral regurgitation and aortic stenosis 01/02/2014  . DOE (dyspnea on exertion) 11/26/2013  . Gout 08/12/2011  . Hypertension 08/12/2011  . High cholesterol 08/12/2011  . OSA (obstructive sleep apnea) 08/12/2011  . Diabetes type 2, uncontrolled ([place]) 08/12/2011   PCP:  [name], MD Pharmacy:   [facility]  [address] [place] [address] Phone: [phone] Fax: [phone]     Social Determinants of Health (SDOH) Interventions  Patient and wife drive.  Denies issues with meds.  Readmission  Risk Interventions No flowsheet data found.

## 2018-10-20 NOTE — Care Management Obs Status (Signed)
[place] NOTIFICATION   Patient Details  Name: DONAT HUMBLE MRN: [id number] Date of Birth: 1945/10/31   Medicare Observation Status Notification Given:  Yes    Pollie Friar, RN 10/20/2018, 2:16 PM

## 2018-10-20 NOTE — Plan of Care (Addendum)
Nutrition Education Note  RD consulted for nutrition education regarding  low sodium diet.  RD working remotely. Pt contacted via pt's inpatient room phone. Reviewed patient's dietary recall. Provided examples on ways to decrease sodium intake in diet. Discouraged intake of processed foods and use of salt shaker. Encouraged fresh fruits and vegetables as well as whole grain sources of carbohydrates to maximize fiber intake. Discussed low sodium meal and snack ideas. Teach back method used. RD to mail handout "Low Sodium Nutrition Therapy" from the Academy of [employer] Diet[employer] Manual to pt home address. Pt agreeable.   Expect good compliance.  Body mass index is 31.83 kg/m. Pt meets criteria for class I obesity based on current BMI.  Current diet order is heart healthy/carbohydrate modified. Pt reports appetite is fair, however still consumes at least 2-3 meals a day with snacks in between. Labs and medications reviewed. No further nutrition interventions warranted at this time. If additional nutrition issues arise, please re-consult RD.   Corrin Parker, MS, RD, LDN Pager # [phone] After hours/ weekend pager # [phone]

## 2018-10-20 NOTE — Plan of Care (Signed)
Pt to unit Aox4, c/o headache 2/10 then 3/10 left side. Pt stable ambulating to restroom, educated on fluid restriction and I&O monitoring.

## 2018-10-20 NOTE — Progress Notes (Signed)
[facility] PROGRESS NOTE  Antonio Wells LNL:[id number] DOB: Dec 11, 1945 DOA: 10/19/2018 PCP: [name], MD  Assessment/Plan: 1. Hypertensive encephalopathy - encephalopathy resolved. CT head without acute abnormality CT angiogram of head and neck without any large vessel occlusion. MRI without acute abnormality.  Blood pressure remains poorly controlled. Current regimen includes scheduled hydralazine, avapro 155m and lopressor 519mbid.  PRN IV labetalol ordered with parameters but it is not being given consistently when parameters met. Will check BP every 4 hours.  2. CAD status post CABG continues to deny chest pain.  Continue aspirin metoprolol and statin. 3. Diabetes mellitus type 2. Serum glucose 119. A1c 6.5 07/2017. continue sliding scale coverage. 4. Chronic kidney disease stage III creatinine appears to be at baseline with creatinine 1.5. home meds include ARB and prn lasix 5. Sleep apnea on CPAP  Code Status: full Family Communication: none at bedside Disposition Plan: home hopefully tomorrow   Consultants:  none  Procedures:    Antibiotics:    HPI/Subjective: Patient [id number] hx htn, dm, cad s/p cabg, ckd stage III admitted with acute encephalopathy and dysarthria. No fever, chills cp, sob n/v. Did complain headache. Workup reveals uncontrolled BP. Encephalopathy and dysarthria resolved this am. BP remains difficult to control  Objective: Vitals:   10/20/18 0317 10/20/18 0736  BP: (!) 185/89 (!) 196/82  Pulse: 72 74  Resp: 18 15  Temp: 97.9 F (36.6 C) 98 F (36.7 C)  SpO2: 98% 98%    Intake/Output Summary (Last 24 hours) at 10/20/2018 1055 Last data filed at 10/20/2018 0300 Gross per 24 hour  Intake 2.84 ml  Output 320 ml  Net -317.16 ml   Filed Weights   10/19/18 1500 10/19/18 2329 10/20/18 0349  Weight: 88.1 kg 81.5 kg 81.5 kg    Exam:   General:  Awake alert oriented x3  Cardiovascular: rrr no mgr no LE edema  Respiratory:  normal effort BS clear bilaterally no wheeze  Abdomen: obese soft +BS no guarding or rebounding  Musculoskeletal: joints without swelling/erythema   Data Reviewed: Basic Metabolic Panel: Recent Labs  Lab 10/19/18 1500 10/19/18 1513 10/20/18 0301  NA 141  --  141  K 4.0  --  3.6  CL 104  --  106  CO2 25  --  27  GLUCOSE 86  --  119*  BUN 16  --  14  CREATININE 1.48* 1.50* 1.50*  CALCIUM 9.0  --  8.9   Liver Function Tests: Recent Labs  Lab 10/19/18 1500 10/20/18 0301  AST 28 22  ALT 24 22  ALKPHOS 64 65  BILITOT 0.7 0.8  PROT 7.0 6.6  ALBUMIN 4.3 3.9   No results for input(s): LIPASE, AMYLASE in the last 168 hours. No results for input(s): AMMONIA in the last 168 hours. CBC: Recent Labs  Lab 10/19/18 1500 10/20/18 0301  WBC 5.1 6.1  NEUTROABS 1.8 2.6  HGB 14.0 13.6  HCT 42.4 38.5*  MCV 91.8 89.7  PLT 186 177   Cardiac Enzymes: No results for input(s): CKTOTAL, CKMB, CKMBINDEX, TROPONINI in the last 168 hours. BNP (last 3 results) No results for input(s): BNP in the last 8760 hours.  ProBNP (last 3 results) No results for input(s): PROBNP in the last 8760 hours.  CBG: Recent Labs  Lab 10/19/18 1456 10/20/18 0605  GLUCAP 66* 131*    No results found for this or any previous visit (from the past 240 hour(s)).   Studies: Ct Angio Head W Or Wo  Contrast  Result Date: 10/19/2018 CLINICAL DATA:  Aphasia, headache.  Stroke EXAM: CT ANGIOGRAPHY HEAD AND NECK TECHNIQUE: Multidetector CT imaging of the head and neck was performed using the standard protocol during bolus administration of intravenous contrast. Multiplanar CT image reconstructions and MIPs were obtained to evaluate the vascular anatomy. Carotid stenosis measurements (when applicable) are obtained utilizing NASCET criteria, using the distal internal carotid diameter as the denominator. CONTRAST:  54m OMNIPAQUE IOHEXOL 350 MG/ML SOLN COMPARISON:  CT head 10/19/2018 FINDINGS: CTA NECK FINDINGS  Aortic arch: Atherosclerotic aortic arch without aneurysm or dissection. Atherosclerotic calcification proximal great vessels without significant stenosis. Right carotid system: Atherosclerotic calcification throughout the right common carotid artery. Atherosclerotic calcification right carotid bifurcation. Less than 25% diameter stenosis right internal carotid artery proximally Left carotid system: Mild atherosclerotic calcification throughout the left common carotid artery without stenosis. Atherosclerotic disease left carotid bifurcation without significant stenosis. Vertebral arteries: Dominant left vertebral artery. Moderate stenosis at the origin. Left vertebral artery is patent to the basilar without additional stenosis Small and diffusely diseased right vertebral artery without significant contribution to the basilar. Skeleton: Cervical spondylosis. Median sternotomy. No acute skeletal abnormality. Other neck: Negative for mass or adenopathy Upper chest: Lung apices clear bilaterally. Review of the MIP images confirms the above findings CTA HEAD FINDINGS Anterior circulation: Extensive atherosclerotic disease cavernous carotid bilaterally with moderate stenosis bilaterally. Severe stenosis in the superior branch of the right MCA. Inferior branch is patent proximally. No branch occlusion. Mild atherosclerotic disease in the A2 segments bilaterally. Mild stenosis left M1 segment and left M2 segments due to atherosclerotic disease. Posterior circulation: Left vertebral artery supplies the basilar. Right vertebral artery ends in PICA. PICA patent bilaterally. Small caliber basilar without significant stenosis. Fetal origin of the posterior cerebral artery on the right. Both posterior cerebral arteries are patent without significant stenosis. Superior cerebellar artery patent bilaterally. Venous sinuses: Normal venous enhancement. Anatomic variants: None Delayed phase: Not perform Review of the MIP images  confirms the above findings IMPRESSION: 1. Negative for emergent large vessel occlusion 2. Severe diffuse atherosclerotic disease 3. Diffuse atherosclerotic disease in the carotid artery bilaterally. 25% stenosis proximal right carotid artery. Moderate stenosis of the cavernous carotid bilaterally. 4. Small and diffusely disease right vertebral artery ends in PICA. Dominant left vertebral artery with moderate stenosis at the origin 5. Severe stenosis superior branch right MCA. 6. These results were called by telephone at the time of interpretation on 10/19/2018 at 3:42 pm to Dr. MRoland Rack, who verbally acknowledged these results. Electronically Signed   By: CFranchot GalloM.D.   On: 10/19/2018 15:43   Ct Angio Neck W Or Wo Contrast  Result Date: 10/19/2018 CLINICAL DATA:  Aphasia, headache.  Stroke EXAM: CT ANGIOGRAPHY HEAD AND NECK TECHNIQUE: Multidetector CT imaging of the head and neck was performed using the standard protocol during bolus administration of intravenous contrast. Multiplanar CT image reconstructions and MIPs were obtained to evaluate the vascular anatomy. Carotid stenosis measurements (when applicable) are obtained utilizing NASCET criteria, using the distal internal carotid diameter as the denominator. CONTRAST:  759mOMNIPAQUE IOHEXOL 350 MG/ML SOLN COMPARISON:  CT head 10/19/2018 FINDINGS: CTA NECK FINDINGS Aortic arch: Atherosclerotic aortic arch without aneurysm or dissection. Atherosclerotic calcification proximal great vessels without significant stenosis. Right carotid system: Atherosclerotic calcification throughout the right common carotid artery. Atherosclerotic calcification right carotid bifurcation. Less than 25% diameter stenosis right internal carotid artery proximally Left carotid system: Mild atherosclerotic calcification throughout the left common carotid artery without stenosis.  Atherosclerotic disease left carotid bifurcation without significant stenosis.  Vertebral arteries: Dominant left vertebral artery. Moderate stenosis at the origin. Left vertebral artery is patent to the basilar without additional stenosis Small and diffusely diseased right vertebral artery without significant contribution to the basilar. Skeleton: Cervical spondylosis. Median sternotomy. No acute skeletal abnormality. Other neck: Negative for mass or adenopathy Upper chest: Lung apices clear bilaterally. Review of the MIP images confirms the above findings CTA HEAD FINDINGS Anterior circulation: Extensive atherosclerotic disease cavernous carotid bilaterally with moderate stenosis bilaterally. Severe stenosis in the superior branch of the right MCA. Inferior branch is patent proximally. No branch occlusion. Mild atherosclerotic disease in the A2 segments bilaterally. Mild stenosis left M1 segment and left M2 segments due to atherosclerotic disease. Posterior circulation: Left vertebral artery supplies the basilar. Right vertebral artery ends in PICA. PICA patent bilaterally. Small caliber basilar without significant stenosis. Fetal origin of the posterior cerebral artery on the right. Both posterior cerebral arteries are patent without significant stenosis. Superior cerebellar artery patent bilaterally. Venous sinuses: Normal venous enhancement. Anatomic variants: None Delayed phase: Not perform Review of the MIP images confirms the above findings IMPRESSION: 1. Negative for emergent large vessel occlusion 2. Severe diffuse atherosclerotic disease 3. Diffuse atherosclerotic disease in the carotid artery bilaterally. 25% stenosis proximal right carotid artery. Moderate stenosis of the cavernous carotid bilaterally. 4. Small and diffusely disease right vertebral artery ends in PICA. Dominant left vertebral artery with moderate stenosis at the origin 5. Severe stenosis superior branch right MCA. 6. These results were called by telephone at the time of interpretation on 10/19/2018 at 3:42 pm to Dr.  Roland Rack , who verbally acknowledged these results. Electronically Signed   By: Franchot Gallo M.D.   On: 10/19/2018 15:43   Mr Brain Wo Contrast  Result Date: 10/19/2018 CLINICAL DATA:  Headache over the last 2 days. Speech disturbance beginning today. EXAM: MRI HEAD WITHOUT CONTRAST TECHNIQUE: Multiplanar, multiecho pulse sequences of the brain and surrounding structures were obtained without intravenous contrast. COMPARISON:  CT studies same day FINDINGS: Brain: Diffusion imaging does not show any acute or subacute infarction. The brainstem and cerebellum are normal. Cerebral hemispheres show a few punctate foci of T2 and FLAIR signal within the white matter consistent with minimal small vessel change, less than usually seen at this age. No cortical or large vessel territory infarction. No mass lesion, hemorrhage, hydrocephalus or extra-axial collection. Vascular: Major vessels at the base of the brain show flow. Skull and upper cervical spine: Negative Sinuses/Orbits: Clear/normal Other: None IMPRESSION: No cause of the presenting symptoms is identified. No acute or subacute infarction. Normal study for age, with only a few punctate foci of T2 and FLAIR signal in the white matter consistent with minimal small vessel change, less than often seen at this age. Electronically Signed   By: Nelson Chimes M.D.   On: 10/19/2018 17:37   Ct Head Code Stroke Wo Contrast  Result Date: 10/19/2018 CLINICAL DATA:  Code stroke. Aphasia and headache. Last seen normal 1145 hours EXAM: CT HEAD WITHOUT CONTRAST TECHNIQUE: Contiguous axial images were obtained from the base of the skull through the vertex without intravenous contrast. COMPARISON:  05/31/2013 FINDINGS: Brain: Mild age related volume loss. No evidence of old or acute focal infarction, mass lesion, hemorrhage, hydrocephalus or extra-axial collection. Vascular: There is atherosclerotic calcification of the major vessels at the base of the brain. Skull:  Negative Sinuses/Orbits: Clear/normal Other: None ASPECTS ([place] Stroke Program Early CT Score) - Ganglionic level  infarction (caudate, lentiform nuclei, internal capsule, insula, M1-M3 cortex): 7 - Supraganglionic infarction (M4-M6 cortex): 3 Total score (0-10 with 10 being normal): 10 IMPRESSION: 1. Normal for age. Mild generalized atrophy. Atherosclerotic calcification of the major vessels at the base of the brain. 2. ASPECTS is 10. 3. These results were communicated to Dr. Leonel Ramsay at 3:16 pmon 3/26/2020by text page via the [facility] messaging system. Electronically Signed   By: Nelson Chimes M.D.   On: 10/19/2018 15:17    Scheduled Meds: . aspirin EC  81 mg Oral Daily  . atorvastatin  80 mg Oral q1800  . heparin  5,000 Units Subcutaneous Q8H  . hydrALAZINE  25 mg Oral Q8H  . insulin aspart  0-9 Units Subcutaneous TID WC  . irbesartan  150 mg Oral Daily  . metoprolol tartrate  50 mg Oral BID   Continuous Infusions:  Principal Problem:   Hypertensive encephalopathy Active Problems:   Chronic kidney disease, stage 3 (HCC)   DM (diabetes mellitus), type 2 with renal complications (HCC)   OSA on CPAP   S/P CABG x 3    Time spent: 10 minutes    [facility] M NP  [facility]  If 7PM-7AM, please contact night-coverage at [email or web address], password [facility] 10/20/2018, 10:55 AM  LOS: 0 days

## 2018-10-21 DIAGNOSIS — Z9989 Dependence on other enabling machines and devices: Secondary | ICD-10-CM

## 2018-10-21 DIAGNOSIS — G4733 Obstructive sleep apnea (adult) (pediatric): Secondary | ICD-10-CM | POA: Diagnosis not present

## 2018-10-21 DIAGNOSIS — I674 Hypertensive encephalopathy: Secondary | ICD-10-CM | POA: Diagnosis not present

## 2018-10-21 DIAGNOSIS — I10 Essential (primary) hypertension: Secondary | ICD-10-CM

## 2018-10-21 DIAGNOSIS — E1129 Type 2 diabetes mellitus with other diabetic kidney complication: Secondary | ICD-10-CM | POA: Diagnosis not present

## 2018-10-21 DIAGNOSIS — N183 Chronic kidney disease, stage 3 (moderate): Secondary | ICD-10-CM | POA: Diagnosis not present

## 2018-10-21 DIAGNOSIS — Z951 Presence of aortocoronary bypass graft: Secondary | ICD-10-CM

## 2018-10-21 LAB — BASIC METABOLIC PANEL
ANION GAP: 14 (ref 5–15)
BUN: 14 mg/dL (ref 8–23)
CALCIUM: 9.5 mg/dL (ref 8.9–10.3)
CO2: 25 mmol/L (ref 22–32)
Chloride: 101 mmol/L (ref 98–111)
Creatinine, Ser: 1.52 mg/dL — ABNORMAL HIGH (ref 0.61–1.24)
GFR calc non Af Amer: 45 mL/min — ABNORMAL LOW (ref >60)
GFR, EST AFRICAN AMERICAN: 53 mL/min — AB (ref >60)
Glucose, Bld: 186 mg/dL — ABNORMAL HIGH (ref 70–99)
Potassium: 4 mmol/L (ref 3.5–5.1)
Sodium: 140 mmol/L (ref 135–145)

## 2018-10-21 LAB — GLUCOSE, CAPILLARY: Glucose-Capillary: 164 mg/dL — ABNORMAL HIGH (ref 70–99)

## 2018-10-21 MED ORDER — IRBESARTAN 150 MG PO TABS: 150.0000 mg | ORAL_TABLET | Freq: Every day | ORAL | 1 refills | Status: DC

## 2018-10-21 MED ORDER — PROMETHAZINE HCL 25 MG/ML IJ SOLN
12.5000 mg | Freq: Four times a day (QID) | INTRAMUSCULAR | Status: DC | PRN
  Administered 2018-10-21: 12.5 mg via INTRAVENOUS
  Filled 2018-10-21 (×2): qty 1

## 2018-10-21 MED ORDER — HYDRALAZINE HCL 50 MG PO TABS: 50.0000 mg | ORAL_TABLET | Freq: Three times a day (TID) | ORAL | 1 refills | Status: DC

## 2018-10-21 MED ORDER — FUROSEMIDE 20 MG PO TABS
20.0000 mg | ORAL_TABLET | Freq: Once | ORAL | Status: AC
  Administered 2018-10-21: 20 mg via ORAL
  Filled 2018-10-21: qty 1

## 2018-10-21 MED ORDER — METOPROLOL TARTRATE 50 MG PO TABS: 75.0000 mg | ORAL_TABLET | Freq: Two times a day (BID) | ORAL | Status: DC

## 2018-10-21 MED ORDER — FUROSEMIDE 20 MG PO TABS: 20.0000 mg | ORAL_TABLET | Freq: Every day | ORAL | Status: DC

## 2018-10-21 MED ORDER — METOPROLOL TARTRATE 50 MG PO TABS
50.0000 mg | ORAL_TABLET | Freq: Two times a day (BID) | ORAL | Status: DC
  Administered 2018-10-21: 50 mg via ORAL
  Filled 2018-10-21: qty 1

## 2018-10-21 NOTE — Progress Notes (Signed)
Patient given discharge summary and instructions, able to verbalize understanding and teach back. No new questions or concerns. IV and tele removed.   Pt to transport home per wife, staff d/c from unit in wheelchair.

## 2018-10-21 NOTE — Discharge Summary (Signed)
Physician Discharge Summary  Antonio Wells BWG:[id number] DOB: 01-Aug-1945 DOA: 10/19/2018  PCP: [name], MD  Admit date: 10/19/2018 Discharge date: 10/21/2018  Time spent: 45 minutes  Recommendations for Outpatient Follow-up:  1. Follow up with PCP 2-3 weeks for evaluation of BP control.    Discharge Diagnoses:  Principal Problem:   Hypertensive encephalopathy Active Problems:   Chronic kidney disease, stage 3 (HCC)   DM (diabetes mellitus), type 2 with renal complications (HCC)   OSA on CPAP   S/P CABG x 3   Discharge Condition: stable  Diet recommendation: heart healthy  Filed Weights   10/19/18 2329 10/20/18 0349 10/21/18 0458  Weight: 81.5 kg 81.5 kg 79.8 kg    History of present illness:  Patient 73 yo hx htn, dm, cad s/p cabg, ckd stage III admitted 10/19/18 with acute encephalopathy and dysarthria. No fever, chills cp, sob n/v. Did complain headache. Workup reveals uncontrolled BP. Patient stated week prior patient's amlodipine was discontinued since patient was having dizziness.  Patient's ARB dose was doubled at that time.  Patient stated he had been compliant with his medications.  Hospital Course:   1. Hypertensive encephalopathy- encephalopathy resolved quickly. CT head without acute abnormality CT angiogram of head and neck without any large vessel occlusion. MRI without acute abnormality.  Blood pressure remained poorly controlled. Home regimen included avapro 150mg  and lopressor 50mg  bid. Hydralazine TID added with only fair results. Will need to follow up with PCP 2-3 weeks for evaluation of control. Recommended to patient daily monitoring of BP and sharing data with PCP upon follow up  2. CAD status post CABG denied chest pain. ekg without acute changes.  3. Diabetes mellitus type 2. Serum glucose 119. A1c 6.5 07/2017.  4. Chronic kidney disease stage III creatinine. Creatinine remained at baseline range 1.7-1.8.  5. Sleep apnea on  CPAP Procedures:  Consultations:    Discharge Exam: Vitals:   10/21/18 0819 10/21/18 1019  BP: (!) 183/85 (!) 158/71  Pulse: 77 71  Resp: 19   Temp: 98.7 F (37.1 C)   SpO2: 99%     General: awake alert oriented x3. No acute distress Cardiovascular: RRR no mgr no LE edema Respiratory: normal effort BS clear bilaterally no wheeze Neuro: speech clear facial symmetry moving all extremities.   Discharge Instructions   Discharge Instructions    Call MD for:  persistant dizziness or light-headedness   Complete by:  As directed    Call MD for:  persistant nausea and vomiting   Complete by:  As directed    Diet - low sodium heart healthy   Complete by:  As directed    Discharge instructions   Complete by:  As directed    Monitor BP daily for 2 weeks if able Take readings to PCP appointment for evaluation Follow up with PCP 2-3 weeks for evaluation of BP control   Increase activity slowly   Complete by:  As directed      Allergies as of 10/21/2018      Reactions   Penicillins Other (See Comments)   Has patient had a PCN reaction causing immediate rash, facial/tongue/throat swelling, SOB or lightheadedness with hypotension: No Has patient had a PCN reaction causing severe rash involving mucus membranes or skin necrosis: No Has patient had a PCN reaction that required hospitalization: No Has patient had a PCN reaction occurring within the last 10 years: No If all of the above answers are "NO", then may proceed with Cephalosporin use.  Passed out ? SYNCOPE ?   Lisinopril Cough   Amiodarone Nausea And Vomiting   Amlodipine Other (See Comments)   Causes dizzines   Oxycodone Other (See Comments)   hallucinations   Zofran [ondansetron Hcl] Other (See Comments)   Has an interaction with another medication pt takes    Cortisone Nausea And Vomiting, Other (See Comments)   HICCUPS   Tramadol Nausea And Vomiting, Other (See Comments)   HICCUPS      Medication List    TAKE  these medications   acetaminophen 325 MG tablet Commonly known as:  TYLENOL Take 650 mg by mouth every 6 (six) hours as needed for mild pain.   aspirin EC 81 MG tablet Take 1 tablet (81 mg total) by mouth daily.   atorvastatin 80 MG tablet Commonly known as:  LIPITOR Take 1 tablet (80 mg total) by mouth daily at 6 PM.   colchicine 0.6 MG tablet Take 1-2 tablets (0.6-1.2 mg total) by mouth daily as needed (for gout flare). 2 po at onset of flair and then repeat in 1 h for acute gout attack   furosemide 20 MG tablet Commonly known as:  LASIX Take 1 tablet (20 mg total) by mouth daily as needed for fluid or edema.   glipiZIDE 5 MG tablet Commonly known as:  GLUCOTROL Take 1 tablet (5 mg total) by mouth daily before breakfast.   hydrALAZINE 50 MG tablet Commonly known as:  APRESOLINE Take 1 tablet (50 mg total) by mouth every 8 (eight) hours for 30 days.   irbesartan 150 MG tablet Commonly known as:  AVAPRO Take 1 tablet (150 mg total) by mouth daily for 30 days.   metFORMIN 500 MG tablet Commonly known as:  GLUCOPHAGE Take 1 tablet (500 mg total) by mouth 2 (two) times daily with a meal.   metoprolol tartrate 50 MG tablet Commonly known as:  LOPRESSOR Take 1 tablet (50 mg total) by mouth 2 (two) times daily.      Allergies  Allergen Reactions  . Penicillins Other (See Comments)    Has patient had a PCN reaction causing immediate rash, facial/tongue/throat swelling, SOB or lightheadedness with hypotension: No Has patient had a PCN reaction causing severe rash involving mucus membranes or skin necrosis: No Has patient had a PCN reaction that required hospitalization: No Has patient had a PCN reaction occurring within the last 10 years: No If all of the above answers are "NO", then may proceed with Cephalosporin use.   Passed out ? SYNCOPE ?  Marland Kitchen Lisinopril Cough  . Amiodarone Nausea And Vomiting  . Amlodipine Other (See Comments)    Causes dizzines  . Oxycodone Other  (See Comments)    hallucinations  . Zofran [Ondansetron Hcl] Other (See Comments)    Has an interaction with another medication pt takes   . Cortisone Nausea And Vomiting and Other (See Comments)    HICCUPS  . Tramadol Nausea And Vomiting and Other (See Comments)    HICCUPS      The results of significant diagnostics from this hospitalization (including imaging, microbiology, ancillary and laboratory) are listed below for reference.    Significant Diagnostic Studies: Ct Angio Head W Or Wo Contrast  Result Date: 10/19/2018 CLINICAL DATA:  Aphasia, headache.  Stroke EXAM: CT ANGIOGRAPHY HEAD AND NECK TECHNIQUE: Multidetector CT imaging of the head and neck was performed using the standard protocol during bolus administration of intravenous contrast. Multiplanar CT image reconstructions and MIPs were obtained to evaluate the vascular  anatomy. Carotid stenosis measurements (when applicable) are obtained utilizing NASCET criteria, using the distal internal carotid diameter as the denominator. CONTRAST:  20mL OMNIPAQUE IOHEXOL 350 MG/ML SOLN COMPARISON:  CT head 10/19/2018 FINDINGS: CTA NECK FINDINGS Aortic arch: Atherosclerotic aortic arch without aneurysm or dissection. Atherosclerotic calcification proximal great vessels without significant stenosis. Right carotid system: Atherosclerotic calcification throughout the right common carotid artery. Atherosclerotic calcification right carotid bifurcation. Less than 25% diameter stenosis right internal carotid artery proximally Left carotid system: Mild atherosclerotic calcification throughout the left common carotid artery without stenosis. Atherosclerotic disease left carotid bifurcation without significant stenosis. Vertebral arteries: Dominant left vertebral artery. Moderate stenosis at the origin. Left vertebral artery is patent to the basilar without additional stenosis Small and diffusely diseased right vertebral artery without significant  contribution to the basilar. Skeleton: Cervical spondylosis. Median sternotomy. No acute skeletal abnormality. Other neck: Negative for mass or adenopathy Upper chest: Lung apices clear bilaterally. Review of the MIP images confirms the above findings CTA HEAD FINDINGS Anterior circulation: Extensive atherosclerotic disease cavernous carotid bilaterally with moderate stenosis bilaterally. Severe stenosis in the superior branch of the right MCA. Inferior branch is patent proximally. No branch occlusion. Mild atherosclerotic disease in the A2 segments bilaterally. Mild stenosis left M1 segment and left M2 segments due to atherosclerotic disease. Posterior circulation: Left vertebral artery supplies the basilar. Right vertebral artery ends in PICA. PICA patent bilaterally. Small caliber basilar without significant stenosis. Fetal origin of the posterior cerebral artery on the right. Both posterior cerebral arteries are patent without significant stenosis. Superior cerebellar artery patent bilaterally. Venous sinuses: Normal venous enhancement. Anatomic variants: None Delayed phase: Not perform Review of the MIP images confirms the above findings IMPRESSION: 1. Negative for emergent large vessel occlusion 2. Severe diffuse atherosclerotic disease 3. Diffuse atherosclerotic disease in the carotid artery bilaterally. 25% stenosis proximal right carotid artery. Moderate stenosis of the cavernous carotid bilaterally. 4. Small and diffusely disease right vertebral artery ends in PICA. Dominant left vertebral artery with moderate stenosis at the origin 5. Severe stenosis superior branch right MCA. 6. These results were called by telephone at the time of interpretation on 10/19/2018 at 3:42 pm to Dr. Roland Rack , who verbally acknowledged these results. Electronically Signed   By: Franchot Gallo M.D.   On: 10/19/2018 15:43   Ct Angio Neck W Or Wo Contrast  Result Date: 10/19/2018 CLINICAL DATA:  Aphasia, headache.   Stroke EXAM: CT ANGIOGRAPHY HEAD AND NECK TECHNIQUE: Multidetector CT imaging of the head and neck was performed using the standard protocol during bolus administration of intravenous contrast. Multiplanar CT image reconstructions and MIPs were obtained to evaluate the vascular anatomy. Carotid stenosis measurements (when applicable) are obtained utilizing NASCET criteria, using the distal internal carotid diameter as the denominator. CONTRAST:  110mL OMNIPAQUE IOHEXOL 350 MG/ML SOLN COMPARISON:  CT head 10/19/2018 FINDINGS: CTA NECK FINDINGS Aortic arch: Atherosclerotic aortic arch without aneurysm or dissection. Atherosclerotic calcification proximal great vessels without significant stenosis. Right carotid system: Atherosclerotic calcification throughout the right common carotid artery. Atherosclerotic calcification right carotid bifurcation. Less than 25% diameter stenosis right internal carotid artery proximally Left carotid system: Mild atherosclerotic calcification throughout the left common carotid artery without stenosis. Atherosclerotic disease left carotid bifurcation without significant stenosis. Vertebral arteries: Dominant left vertebral artery. Moderate stenosis at the origin. Left vertebral artery is patent to the basilar without additional stenosis Small and diffusely diseased right vertebral artery without significant contribution to the basilar. Skeleton: Cervical spondylosis. Median sternotomy. No acute skeletal  abnormality. Other neck: Negative for mass or adenopathy Upper chest: Lung apices clear bilaterally. Review of the MIP images confirms the above findings CTA HEAD FINDINGS Anterior circulation: Extensive atherosclerotic disease cavernous carotid bilaterally with moderate stenosis bilaterally. Severe stenosis in the superior branch of the right MCA. Inferior branch is patent proximally. No branch occlusion. Mild atherosclerotic disease in the A2 segments bilaterally. Mild stenosis left M1  segment and left M2 segments due to atherosclerotic disease. Posterior circulation: Left vertebral artery supplies the basilar. Right vertebral artery ends in PICA. PICA patent bilaterally. Small caliber basilar without significant stenosis. Fetal origin of the posterior cerebral artery on the right. Both posterior cerebral arteries are patent without significant stenosis. Superior cerebellar artery patent bilaterally. Venous sinuses: Normal venous enhancement. Anatomic variants: None Delayed phase: Not perform Review of the MIP images confirms the above findings IMPRESSION: 1. Negative for emergent large vessel occlusion 2. Severe diffuse atherosclerotic disease 3. Diffuse atherosclerotic disease in the carotid artery bilaterally. 25% stenosis proximal right carotid artery. Moderate stenosis of the cavernous carotid bilaterally. 4. Small and diffusely disease right vertebral artery ends in PICA. Dominant left vertebral artery with moderate stenosis at the origin 5. Severe stenosis superior branch right MCA. 6. These results were called by telephone at the time of interpretation on 10/19/2018 at 3:42 pm to Dr. Roland Rack , who verbally acknowledged these results. Electronically Signed   By: Franchot Gallo M.D.   On: 10/19/2018 15:43   Mr Brain Wo Contrast  Result Date: 10/19/2018 CLINICAL DATA:  Headache over the last 2 days. Speech disturbance beginning today. EXAM: MRI HEAD WITHOUT CONTRAST TECHNIQUE: Multiplanar, multiecho pulse sequences of the brain and surrounding structures were obtained without intravenous contrast. COMPARISON:  CT studies same day FINDINGS: Brain: Diffusion imaging does not show any acute or subacute infarction. The brainstem and cerebellum are normal. Cerebral hemispheres show a few punctate foci of T2 and FLAIR signal within the white matter consistent with minimal small vessel change, less than usually seen at this age. No cortical or large vessel territory infarction. No  mass lesion, hemorrhage, hydrocephalus or extra-axial collection. Vascular: Major vessels at the base of the brain show flow. Skull and upper cervical spine: Negative Sinuses/Orbits: Clear/normal Other: None IMPRESSION: No cause of the presenting symptoms is identified. No acute or subacute infarction. Normal study for age, with only a few punctate foci of T2 and FLAIR signal in the white matter consistent with minimal small vessel change, less than often seen at this age. Electronically Signed   By: Nelson Chimes M.D.   On: 10/19/2018 17:37   Ct Head Code Stroke Wo Contrast  Result Date: 10/19/2018 CLINICAL DATA:  Code stroke. Aphasia and headache. Last seen normal 1145 hours EXAM: CT HEAD WITHOUT CONTRAST TECHNIQUE: Contiguous axial images were obtained from the base of the skull through the vertex without intravenous contrast. COMPARISON:  05/31/2013 FINDINGS: Brain: Mild age related volume loss. No evidence of old or acute focal infarction, mass lesion, hemorrhage, hydrocephalus or extra-axial collection. Vascular: There is atherosclerotic calcification of the major vessels at the base of the brain. Skull: Negative Sinuses/Orbits: Clear/normal Other: None ASPECTS ([place] Stroke Program Early CT Score) - Ganglionic level infarction (caudate, lentiform nuclei, internal capsule, insula, M1-M3 cortex): 7 - Supraganglionic infarction (M4-M6 cortex): 3 Total score (0-10 with 10 being normal): 10 IMPRESSION: 1. Normal for age. Mild generalized atrophy. Atherosclerotic calcification of the major vessels at the base of the brain. 2. ASPECTS is 10. 3. These results were  communicated to Dr. Leonel Ramsay at 3:16 pmon 3/26/2020by text page via the [employer] [facility] messaging system. Electronically Signed   By: Nelson Chimes M.D.   On: 10/19/2018 15:17    Microbiology: No results found for this or any previous visit (from the past 240 hour(s)).   Labs: Basic Metabolic Panel: Recent Labs  Lab 10/19/18 1500 10/19/18 1513  10/20/18 0301 10/21/18 0428  NA 141  --  141 140  K 4.0  --  3.6 4.0  CL 104  --  106 101  CO2 25  --  27 25  GLUCOSE 86  --  119* 186*  BUN 16  --  14 14  CREATININE 1.48* 1.50* 1.50* 1.52*  CALCIUM 9.0  --  8.9 9.5   Liver Function Tests: Recent Labs  Lab 10/19/18 1500 10/20/18 0301  AST 28 22  ALT 24 22  ALKPHOS 64 65  BILITOT 0.7 0.8  PROT 7.0 6.6  ALBUMIN 4.3 3.9   No results for input(s): LIPASE, AMYLASE in the last 168 hours. No results for input(s): AMMONIA in the last 168 hours. CBC: Recent Labs  Lab 10/19/18 1500 10/20/18 0301  WBC 5.1 6.1  NEUTROABS 1.8 2.6  HGB 14.0 13.6  HCT 42.4 38.5*  MCV 91.8 89.7  PLT 186 177   Cardiac Enzymes: No results for input(s): CKTOTAL, CKMB, CKMBINDEX, TROPONINI in the last 168 hours. BNP: BNP (last 3 results) No results for input(s): BNP in the last 8760 hours.  ProBNP (last 3 results) No results for input(s): PROBNP in the last 8760 hours.  CBG: Recent Labs  Lab 10/20/18 0605 10/20/18 1123 10/20/18 1650 10/20/18 2125 10/21/18 0657  GLUCAP 131* 153* 170* 82 164*       Signed:  Radene Gunning NP [facility] 10/21/2018, 11:58 AM

## 2018-10-25 ENCOUNTER — Telehealth (INDEPENDENT_AMBULATORY_CARE_PROVIDER_SITE_OTHER): Payer: Medicare Other | Admitting: Family Medicine

## 2018-10-25 ENCOUNTER — Other Ambulatory Visit: Payer: Self-pay

## 2018-10-25 DIAGNOSIS — I674 Hypertensive encephalopathy: Secondary | ICD-10-CM | POA: Diagnosis not present

## 2018-10-25 DIAGNOSIS — R5383 Other fatigue: Secondary | ICD-10-CM

## 2018-10-25 DIAGNOSIS — R441 Visual hallucinations: Secondary | ICD-10-CM

## 2018-10-25 DIAGNOSIS — I10 Essential (primary) hypertension: Secondary | ICD-10-CM

## 2018-10-25 NOTE — Progress Notes (Signed)
[facility] follow up for HPB headache- Patient stated he is doing much better and home blood pressure has been running high as 142/85 and as low as 127/76. On 10/23/18 Bp was 132/99 @ 9:15 and 127/76 @ 2:00

## 2018-10-25 NOTE — Progress Notes (Signed)
Virtual Visit via Telephone Note  I connected with Antonio Wells on 10/25/18 at 10:54 AM by telephone and verified that I am speaking with the correct person using two identifiers.   I discussed the limitations, risks, security and privacy concerns of performing an evaluation and management service by telephone and the availability of in person appointments. I also discussed with the patient that there may be a patient responsible charge related to this service. The patient expressed understanding and agreed to proceed, consent obtained  Chief complaint: Hospital follow-up, hypertension and headache  History of Present Illness: Hypertension: BP Readings from Last 3 Encounters:  10/21/18 (!) 153/64  10/12/18 (!) 160/72  08/02/18 138/82   Lab Results  Component Value Date   CREATININE 1.52 (H) 10/21/2018  Admitted March 26 through March 28 for hypertensive encephalopathy/hypertensive emergency.  Noted to have headache on March 26 with some dysarthria.  CT head without acute abnormality, CT angiogram of head and neck without any large vessel occlusion, MRI without acute abnormality.  Symptoms resolved quickly.  EKG without acute changes, and creatinine remained at baseline range.  He was continued on Avapro 150 mg daily, Lopressor 50 mg twice daily, hydralazine 50mg  added 3 times daily. 1 missed dose last night possible o/w has been taking above.  Home readings since hospitalization:  122/62, 148/80, 179/109, 127/76, 157/99, 152/80, 185/104 - sat in chair - 142/85 few minutes later.   Feels ok coming form hospital. Has lost some weight - some fatigue, less intake. About 20 pounds. Has been trying to eat better at home. Drinking fluids. 1st Glucerna this am.  Has not checked blood sugar recently. 131, 153, 170, 82, 164.   Had fentanyl 3 doses total (3/27, 3/28, 3/28). Had some hallucinations in past with opioids. Has noticed some possible hallucinations at night still. Weird things  happening at night. Not during the day. Slight woozy during the day, but feels like due to decreased intake.  No slurred speech, droop, focal weakness, HA has improved. No chest pain.    Patient Active Problem List   Diagnosis Date Noted  . Hypertensive encephalopathy 10/19/2018  . Chronic kidney disease, stage 3 ([place]) 10/19/2018  . DM (diabetes mellitus), type 2 with renal complications ([place]) 25/85/2778  . Cough 03/17/2018  . Lower resp. tract infection 03/17/2018  . Sleep apnea with use of continuous positive airway pressure (CPAP) 01/04/2018  . Malnutrition of moderate degree 07/14/2017  . Thrush 07/11/2017  . FTT (failure to thrive) in adult 07/11/2017  . 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 CAD (coronary artery disease) 05/25/2017  . Unstable angina ([place])   . Hypertensive heart disease 07/14/2015  . Lower extremity edema 07/14/2015  . Aortic stenosis 01/09/2015  . OSA on CPAP 12/12/2014  . Hypersomnia with sleep apnea 12/12/2014  . Obstructive sleep apnea (adult) (pediatric) 02/04/2014  . 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 Obesity 02/04/2014  . Mitral regurgitation and aortic stenosis 01/02/2014  . DOE (dyspnea on exertion) 11/26/2013  . Gout 08/12/2011  . Hypertension 08/12/2011  . High cholesterol 08/12/2011  . OSA (obstructive sleep apnea) 08/12/2011  . Diabetes type 2, uncontrolled ([place]) 08/12/2011   Past Medical History:  Diagnosis Date  . Arthritis    "knees" (07/11/2017)  . CKD (chronic kidney disease), stage III ([place])   . Coronary artery disease    a.  CABGx3 in 05/2017.  Marland Kitchen Gout   . Heart murmur   . High cholesterol   . Hypertension   .  Mild aortic stenosis   . Mild mitral regurgitation   . OSA on CPAP   . Postoperative atrial fibrillation ([place]) 06/2017  . Sleep apnea   . Type II diabetes mellitus ([place])    Past Surgical History:  Procedure Laterality Date  . CARDIAC CATHETERIZATION    . COLONOSCOPY  2008  . CORONARY ARTERY BYPASS GRAFT N/A 06/24/2017    Procedure: CORONARY ARTERY BYPASS GRAFTING (CABG) x 3 ; Using Left Internal Mammary Artery and Right Great Saphenous Leg Vein Harvested Endoscopically;  Surgeon: Gaye Pollack, MD;  Location: [facility];  Service: Open Heart Surgery;  Laterality: N/A;  . EYE SURGERY    . LEFT HEART CATH AND CORONARY ANGIOGRAPHY N/A 05/25/2017   Procedure: LEFT HEART CATH AND CORONARY ANGIOGRAPHY;  Surgeon: Wellington Hampshire, MD;  Location: [facility];  Service: Cardiovascular;  Laterality: N/A;  . TEE WITHOUT CARDIOVERSION N/A 06/24/2017   Procedure: TRANSESOPHAGEAL ECHOCARDIOGRAM (TEE);  Surgeon: Gaye Pollack, MD;  Location: [facility];  Service: Open Heart Surgery;  Laterality: N/A;   Allergies  Allergen Reactions  . Penicillins Other (See Comments)    Has patient had a PCN reaction causing immediate rash, facial/tongue/throat swelling, SOB or lightheadedness with hypotension: No Has patient had a PCN reaction causing severe rash involving mucus membranes or skin necrosis: No Has patient had a PCN reaction that required hospitalization: No Has patient had a PCN reaction occurring within the last 10 years: No If all of the above answers are "NO", then may proceed with Cephalosporin use.   Passed out ? SYNCOPE ?  Marland Kitchen Lisinopril Cough  . Amiodarone Nausea And Vomiting  . Amlodipine Other (See Comments)    Causes dizzines  . Oxycodone Other (See Comments)    hallucinations  . Zofran [Ondansetron Hcl] Other (See Comments)    Has an interaction with another medication pt takes   . Cortisone Nausea And Vomiting and Other (See Comments)    HICCUPS  . Tramadol Nausea And Vomiting and Other (See Comments)    HICCUPS   Prior to Admission medications   Medication Sig Start Date End Date Taking? Authorizing Provider  acetaminophen (TYLENOL) 325 MG tablet Take 650 mg by mouth every 6 (six) hours as needed for mild pain.   Yes [provider]  aspirin EC 81 MG tablet Take 1 tablet (81 mg total) by  mouth daily. 09/02/17  Yes Dunn, Dayna N, PA-C  atorvastatin (LIPITOR) 80 MG tablet Take 1 tablet (80 mg total) by mouth daily at 6 PM. 08/17/18  Yes [name], MD  furosemide (LASIX) 20 MG tablet Take 1 tablet (20 mg total) by mouth daily as needed for fluid or edema. 10/12/18  Yes [name], MD  glipiZIDE (GLUCOTROL) 5 MG tablet Take 1 tablet (5 mg total) by mouth daily before breakfast. 08/14/18  Yes [name], MD  hydrALAZINE (APRESOLINE) 50 MG tablet Take 1 tablet (50 mg total) by mouth every 8 (eight) hours for 30 days. 10/21/18 11/20/18 Yes Black, Lezlie Octave, NP  irbesartan (AVAPRO) 150 MG tablet Take 1 tablet (150 mg total) by mouth daily for 30 days. 10/21/18 11/20/18 Yes Black, Lezlie Octave, NP  metFORMIN (GLUCOPHAGE) 500 MG tablet Take 1 tablet (500 mg total) by mouth 2 (two) times daily with a meal. 08/14/18  Yes [name], MD  metoprolol tartrate (LOPRESSOR) 50 MG tablet Take 1 tablet (50 mg total) by mouth 2 (two) times daily. 08/17/18  Yes [name]  R, MD  colchicine 0.6 MG tablet Take 1-2 tablets (0.6-1.2 mg total) by mouth daily as needed (for gout flare). 2 po at onset of flair and then repeat in 1 h for acute gout attack Patient not taking: Reported on 10/25/2018 12/30/17   [name], MD   Social History   Socioeconomic History  . Marital status: Married    Spouse name: [name]  . Number of children: 3  . Years of education: 37  . Highest education level: Not on file  Occupational History  . Occupation: Musician  Social Needs  . Financial resource strain: Not on file  . Food insecurity:    Worry: Not on file    Inability: Not on file  . Transportation needs:    Medical: Not on file    Non-medical: Not on file  Tobacco Use  . Smoking status: Former Smoker    Packs/day: 2.00    Years: 15.00    Pack years: 30.00    Types: Cigarettes    Last attempt to quit: 07/26/1998    Years since quitting: 20.2  . Smokeless tobacco: Never Used  Substance and  Sexual Activity  . Alcohol use: No    Alcohol/week: 0.0 standard drinks  . Drug use: No  . Sexual activity: Yes  Lifestyle  . Physical activity:    Days per week: Not on file    Minutes per session: Not on file  . Stress: Not on file  Relationships  . Social connections:    Talks on phone: Not on file    Gets together: Not on file    Attends religious service: Not on file    Active member of club or organization: Not on file    Attends meetings of clubs or organizations: Not on file    Relationship status: Not on file  . Intimate partner violence:    Fear of current or ex partner: Not on file    Emotionally abused: Not on file    Physically abused: Not on file    Forced sexual activity: Not on file  Other Topics Concern  . Not on file  Social History Narrative   Patient is married [name]) and lives at home with his wife.   Patient has three adult children.   Patient is retired.   Patient is a Research officer, political party.   Patient is right-handed.   Patient drinks one cup of coffee daily and 2-3 sodas per day.        Observations/Objective: Speaking normally on phone, appropriate responses, no dysarthria.  No distress  Assessment and Plan: Encephalopathy, hypertensive  Essential hypertension  Hallucinations, visual  Fatigue, unspecified type  Still some variability in readings at home, but with readings in the 982 systolic, I am hesitant to increase meds further at this time.  Information provided on my chart account on appropriate blood pressure testing at home.  Continue same regimen for now.  ER precautions given if any return of prior hypertensive encephalopathy symptoms or new symptoms.  Update by MyChart next few days  Fatigue likely related to recent illness but expect that to improve with increased intake.  He has started meal replacement shakes which may also help.  RTC/ER precautions if acute worsening.  Reports some visual hallucinations at night, similar symptoms in  the past from narcotic medication.  Expect this to improve over the next few days as fentanyl was last given in the hospital.  Repeat visit recommended if symptoms persist or worsen.  Follow Up Instructions:   Patient Instructions   Although there is still some variability in your home blood pressure readings, I am hesitant to increase medicines at this time given the readings in the 120s.  See information below on how to most effectively check your blood pressure, continue to keep a record few times per day and send me an update by MyChart in the next 2 days.  I expect the hallucinations and symptoms at night to improve until this weekend as the fentanyl is out of your system.  You can check your blood sugar throughout the day as needed if you do not feel well including those times in the evening to make sure it is not dropping too low.    I will try to have you scheduled to see me within a week again likely by telemedicine visit.  If any new or worsening symptoms during that time please be seen in the emergency room.  Return to the clinic or go to the nearest emergency room if any of your symptoms worsen or new symptoms occur.     How to Take Your Blood Pressure Blood pressure is a measurement of how strongly your blood is pressing against the walls of your arteries. Arteries are blood vessels that carry blood from your heart throughout your body. Your health care provider takes your blood pressure at each office visit. You can also take your own blood pressure at home with a blood pressure machine. You may need to take your own blood pressure:  To confirm a diagnosis of high blood pressure (hypertension).  To monitor your blood pressure over time.  To make sure your blood pressure medicine is working. Supplies needed: To take your blood pressure, you will need a blood pressure machine. You can buy a blood pressure machine, or blood pressure monitor, at most drugstores or online. There are  several types of home blood pressure monitors. When choosing one, consider the following:  Choose a monitor that has an arm cuff.  Choose a cuff that wraps snugly around your upper arm. You should be able to fit only one finger between your arm and the cuff.  Do not choose a monitor that measures your blood pressure from your wrist or finger. Your health care provider can suggest a reliable monitor that will meet your needs. How to prepare To get the most accurate reading, avoid the following for 30 minutes before you check your blood pressure:  Drinking caffeine.  Drinking alcohol.  Eating.  Smoking.  Exercising. Five minutes before you check your blood pressure:  Empty your bladder.  Sit quietly without talking in a dining chair, rather than in a soft couch or armchair. How to take your blood pressure To check your blood pressure, follow the instructions in the manual that came with your blood pressure monitor. If you have a digital blood pressure monitor, the instructions may be as follows: 1. Sit up straight. 2. Place your feet on the floor. Do not cross your ankles or legs. 3. Rest your left arm at the level of your heart on a table or desk or on the arm of a chair. 4. Pull up your shirt sleeve. 5. Wrap the blood pressure cuff around the upper part of your left arm, 1 inch (2.5 cm) above your elbow. It is best to wrap the cuff around bare skin. 6. Fit the cuff snugly around your arm. You should be able to place only one finger between the cuff and  your arm. 7. Position the cord inside the groove of your elbow. 8. Press the power button. 9. Sit quietly while the cuff inflates and deflates. 10. Read the digital reading on the monitor screen and write it down (record it). 11. Wait 2-3 minutes, then repeat the steps, starting at step 1. What does my blood pressure reading mean? A blood pressure reading consists of a higher number over a lower number. Ideally, your blood  pressure should be below 120/80. The first ("top") number is called the systolic pressure. It is a measure of the pressure in your arteries as your heart beats. The second ("bottom") number is called the diastolic pressure. It is a measure of the pressure in your arteries as the heart relaxes. Blood pressure is classified into four stages. The following are the stages for adults who do not have a short-term serious illness or a chronic condition. Systolic pressure and diastolic pressure are measured in a unit called mm Hg. Normal  Systolic pressure: below 921.  Diastolic pressure: below 80. Elevated  Systolic pressure: 194-174.  Diastolic pressure: below 80. Hypertension stage 1  Systolic pressure: 081-448.  Diastolic pressure: 18-56. Hypertension stage 2  Systolic pressure: 314 or above.  Diastolic pressure: 90 or above. You can have prehypertension or hypertension even if only the systolic or only the diastolic number in your reading is higher than normal. Follow these instructions at home:  Check your blood pressure as often as recommended by your health care provider.  Take your monitor to the next appointment with your health care provider to make sure: ? That you are using it correctly. ? That it provides accurate readings.  Be sure you understand what your goal blood pressure numbers are.  Tell your health care provider if you are having any side effects from blood pressure medicine. Contact a health care provider if:  Your blood pressure is consistently high. Get help right away if:  Your systolic blood pressure is higher than 180.  Your diastolic blood pressure is higher than 110. This information is not intended to replace advice given to you by your health care provider. Make sure you discuss any questions you have with your health care provider. Document Released: 12/19/2015 Document Revised: 05/24/2017 Document Reviewed: 12/19/2015 Elsevier Interactive Patient  Education  [employer].    If you have lab work done today you will be contacted with your lab results within the next 2 weeks.  If you have not heard from [place] then please contact us. The fastest way to get your results is to register for My Chart.   IF you received an x-ray today, you will receive an invoice from [facility]. Please contact [facility] at [phone] with questions or concerns regarding your invoice.   IF you received labwork today, you will receive an invoice from [facility]. Please contact [facility] at [phone] with questions or concerns regarding your invoice.   Our billing staff will not be able to assist you with questions regarding bills from these companies.  You will be contacted with the lab results as soon as they are available. The fastest way to get your results is to activate your My Chart account. Instructions are located on the last page of this paperwork. If you have not heard from [place] regarding the results in 2 weeks, please contact this office.          I discussed the assessment and treatment plan with the patient. The patient was provided an opportunity  to ask questions and all were answered. The patient agreed with the plan and demonstrated an understanding of the instructions.   The patient was advised to call back or seek an in-person evaluation if the symptoms worsen or if the condition fails to improve as anticipated.  I provided 15 minutes of non-face-to-face time during this encounter.  Signed,   [name], MD [facility].  10/25/18

## 2018-10-25 NOTE — Patient Instructions (Addendum)
Although there is still some variability in your home blood pressure readings, I am hesitant to increase medicines at this time given the readings in the 120s.  See information below on how to most effectively check your blood pressure, continue to keep a record few times per day and send me an update by MyChart in the next 2 days.  I expect the hallucinations and symptoms at night to improve until this weekend as the fentanyl is out of your system.  You can check your blood sugar throughout the day as needed if you do not feel well including those times in the evening to make sure it is not dropping too low.    I will try to have you scheduled to see me within a week again likely by telemedicine visit.  If any new or worsening symptoms during that time please be seen in the emergency room.  Return to the clinic or go to the nearest emergency room if any of your symptoms worsen or new symptoms occur.     How to Take Your Blood Pressure Blood pressure is a measurement of how strongly your blood is pressing against the walls of your arteries. Arteries are blood vessels that carry blood from your heart throughout your body. Your health care provider takes your blood pressure at each office visit. You can also take your own blood pressure at home with a blood pressure machine. You may need to take your own blood pressure:  To confirm a diagnosis of high blood pressure (hypertension).  To monitor your blood pressure over time.  To make sure your blood pressure medicine is working. Supplies needed: To take your blood pressure, you will need a blood pressure machine. You can buy a blood pressure machine, or blood pressure monitor, at most drugstores or online. There are several types of home blood pressure monitors. When choosing one, consider the following:  Choose a monitor that has an arm cuff.  Choose a cuff that wraps snugly around your upper arm. You should be able to fit only one finger between  your arm and the cuff.  Do not choose a monitor that measures your blood pressure from your wrist or finger. Your health care provider can suggest a reliable monitor that will meet your needs. How to prepare To get the most accurate reading, avoid the following for 30 minutes before you check your blood pressure:  Drinking caffeine.  Drinking alcohol.  Eating.  Smoking.  Exercising. Five minutes before you check your blood pressure:  Empty your bladder.  Sit quietly without talking in a dining chair, rather than in a soft couch or armchair. How to take your blood pressure To check your blood pressure, follow the instructions in the manual that came with your blood pressure monitor. If you have a digital blood pressure monitor, the instructions may be as follows: 1. Sit up straight. 2. Place your feet on the floor. Do not cross your ankles or legs. 3. Rest your left arm at the level of your heart on a table or desk or on the arm of a chair. 4. Pull up your shirt sleeve. 5. Wrap the blood pressure cuff around the upper part of your left arm, 1 inch (2.5 cm) above your elbow. It is best to wrap the cuff around bare skin. 6. Fit the cuff snugly around your arm. You should be able to place only one finger between the cuff and your arm. 7. Position the cord inside the groove  of your elbow. 8. Press the power button. 9. Sit quietly while the cuff inflates and deflates. 10. Read the digital reading on the monitor screen and write it down (record it). 11. Wait 2-3 minutes, then repeat the steps, starting at step 1. What does my blood pressure reading mean? A blood pressure reading consists of a higher number over a lower number. Ideally, your blood pressure should be below 120/80. The first ("top") number is called the systolic pressure. It is a measure of the pressure in your arteries as your heart beats. The second ("bottom") number is called the diastolic pressure. It is a measure of the  pressure in your arteries as the heart relaxes. Blood pressure is classified into four stages. The following are the stages for adults who do not have a short-term serious illness or a chronic condition. Systolic pressure and diastolic pressure are measured in a unit called mm Hg. Normal  Systolic pressure: below 130.  Diastolic pressure: below 80. Elevated  Systolic pressure: 865-784.  Diastolic pressure: below 80. Hypertension stage 1  Systolic pressure: 696-295.  Diastolic pressure: 28-41. Hypertension stage 2  Systolic pressure: 324 or above.  Diastolic pressure: 90 or above. You can have prehypertension or hypertension even if only the systolic or only the diastolic number in your reading is higher than normal. Follow these instructions at home:  Check your blood pressure as often as recommended by your health care provider.  Take your monitor to the next appointment with your health care provider to make sure: ? That you are using it correctly. ? That it provides accurate readings.  Be sure you understand what your goal blood pressure numbers are.  Tell your health care provider if you are having any side effects from blood pressure medicine. Contact a health care provider if:  Your blood pressure is consistently high. Get help right away if:  Your systolic blood pressure is higher than 180.  Your diastolic blood pressure is higher than 110. This information is not intended to replace advice given to you by your health care provider. Make sure you discuss any questions you have with your health care provider. Document Released: 12/19/2015 Document Revised: 05/24/2017 Document Reviewed: 12/19/2015 Elsevier Interactive Patient Education  [employer].    If you have lab work done today you will be contacted with your lab results within the next 2 weeks.  If you have not heard from [place] then please contact us. The fastest way to get your results is to register  for My Chart.   IF you received an x-ray today, you will receive an invoice from [facility]. Please contact [facility] at [phone] with questions or concerns regarding your invoice.   IF you received labwork today, you will receive an invoice from [facility]. Please contact [facility] at [phone] with questions or concerns regarding your invoice.   Our billing staff will not be able to assist you with questions regarding bills from these companies.  You will be contacted with the lab results as soon as they are available. The fastest way to get your results is to activate your My Chart account. Instructions are located on the last page of this paperwork. If you have not heard from [place] regarding the results in 2 weeks, please contact this office.

## 2018-11-22 ENCOUNTER — Telehealth: Payer: Self-pay | Admitting: Family Medicine

## 2018-11-22 NOTE — Telephone Encounter (Signed)
Copied from [facility] [id number]. Topic: Quick Communication - See Telephone Encounter >> Nov 22, 2018 12:08 PM Sheran Luz wrote: CRM for notification. See Telephone encounter for: 11/22/18.  Patient called stating that he needs to have papers filled out/signed by PCP in order to get handicapped placard renewed with the DMV. He inquired if this is possible w/o OV. Please advise.

## 2018-11-24 NOTE — Telephone Encounter (Signed)
I have attempted to call pt. There was no answer and left a message to call back.  Does pt need to come back in for this form?

## 2018-11-25 NOTE — Telephone Encounter (Signed)
We have blank placards at the office.  Can pull one of those blank forms and review with patient to determine which category applies for the handicap placard, and I can sign when I am in the office next.

## 2018-11-28 ENCOUNTER — Ambulatory Visit (INDEPENDENT_AMBULATORY_CARE_PROVIDER_SITE_OTHER): Payer: Medicare Other | Admitting: Family Medicine

## 2018-11-28 ENCOUNTER — Other Ambulatory Visit: Payer: Self-pay

## 2018-11-28 ENCOUNTER — Ambulatory Visit: Payer: Self-pay | Admitting: *Deleted

## 2018-11-28 ENCOUNTER — Encounter: Payer: Self-pay | Admitting: Family Medicine

## 2018-11-28 VITALS — BP 188/80 | HR 69 | Temp 98.3°F | Resp 16 | Ht 63.0 in | Wt 192.8 lb

## 2018-11-28 DIAGNOSIS — N183 Chronic kidney disease, stage 3 unspecified: Secondary | ICD-10-CM

## 2018-11-28 DIAGNOSIS — I10 Essential (primary) hypertension: Secondary | ICD-10-CM | POA: Diagnosis not present

## 2018-11-28 DIAGNOSIS — R0989 Other specified symptoms and signs involving the circulatory and respiratory systems: Secondary | ICD-10-CM | POA: Diagnosis not present

## 2018-11-28 MED ORDER — IRBESARTAN 150 MG PO TABS: 150.0000 mg | ORAL_TABLET | Freq: Every day | ORAL | 1 refills | Status: DC

## 2018-11-28 MED ORDER — HYDROCHLOROTHIAZIDE 12.5 MG PO TABS: 12.5000 mg | ORAL_TABLET | Freq: Every day | ORAL | 1 refills | Status: DC

## 2018-11-28 NOTE — Patient Instructions (Addendum)
  As home readings appear to be higher in the morning and in the evening, try changing irbesartan to the evening, start hydrochlorothiazide once each morning, watch for low blood pressure symptoms, lightheadedness, or dizziness, especially when first standing.  Continue metoprolol same dose for right now.  If you do start having lightheadedness or dizziness with standing we may need to decrease metoprolol first.  Okay to use furosemide if needed but I expect that to be less with restarting the diuretic hydrochlorothiazide.  Additionally I will check your kidney function today, and refer you to nephrology to evaluate the kidney function and possibly help with management with blood pressure.  Follow-up with a telemedicine visit in the next 1 week with home blood pressure readings.  Return to the clinic or go to the nearest emergency room if any of your symptoms worsen or new symptoms occur.   If you have lab work done today you will be contacted with your lab results within the next 2 weeks.  If you have not heard from [place] then please contact us. The fastest way to get your results is to register for My Chart.   IF you received an x-ray today, you will receive an invoice from [facility] Radiology. Please contact [facility] Radiology at [phone] with questions or concerns regarding your invoice.   IF you received labwork today, you will receive an invoice from [place]. Please contact [facility] at [phone] with questions or concerns regarding your invoice.   Our billing staff will not be able to assist you with questions regarding bills from these companies.  You will be contacted with the lab results as soon as they are available. The fastest way to get your results is to activate your My Chart account. Instructions are located on the last page of this paperwork. If you have not heard from [place] regarding the results in 2 weeks, please contact this office.

## 2018-11-28 NOTE — Telephone Encounter (Signed)
Patient calls with B/P 205/108 p. 69bpm now and this morning's reading of 189/99. Denies CP/SOB/weakness/vision changes. Reports b/p has been up and down since late February. He is on metoprolol tartrate 50 Mg tabs-takes 2 tabs twice daily. Takes irbesartan 150 mg tabs once daily. He was taking hydralazine 50 mg tabs once daily for 30 tabs. Stated he was told to take these for 30 days. Took the last of the hydralazine last week. Reviewed symptoms he would need to seek immediate intervention for including CP/SOB/dizziness/weakness/vision changes. Stated he understood. Cell # is up to date. Understands he may need virtual visit. Warm transferred to [facility] to schedule visit.   Reason for Disposition . Systolic BP  >= 403 OR Diastolic >= 474  Answer Assessment - Initial Assessment Questions 1. BLOOD PRESSURE: "What is the blood pressure?" "Did you take at least two measurements 5 minutes apart?"     189/99 at 10:48a this morning.  Now 205/108 sitting. P.69 bpm 2. ONSET: "When did you take your blood pressure?"     Now. 3. HOW: "How did you obtain the blood pressure?" (e.g., visiting nurse, automatic home BP monitor)     Home monitor 4. HISTORY: "Do you have a history of high blood pressure?"     yes 5. MEDICATIONS: "Are you taking any medications for blood pressure?" "Have you missed any doses recently?"     ye 6. OTHER SYMPTOMS: "Do you have any symptoms?" (e.g., headache, chest pain, blurred vision, difficulty breathing, weakness)    none 7. PREGNANCY: "Is there any chance you are pregnant?" "When was your last menstrual period?"     na  Protocols used: HIGH BLOOD PRESSURE-A-AH

## 2018-11-28 NOTE — Progress Notes (Signed)
Subjective:    Patient ID: Antonio Wells, male    DOB: Jan 19, 1946, 73 y.o.   MRN: [id number]  HPI Antonio Wells is a 73 y.o. male Presents today for: Chief Complaint  Patient presents with  . Hypertension    Bp has ben running high for a few months now. This am bp was 205/108 then at 11:30 bp was 185/110. Not having any headache, blurred vision or lightheadness at the time of elevated bp. Only know it is high when I take my bp   Hypertension: BP Readings from Last 3 Encounters:  11/28/18 (!) 188/80  10/21/18 (!) 153/64  10/12/18 (!) 160/72   Lab Results  Component Value Date   CREATININE 1.52 (H) 10/21/2018   Here for follow-up of hypertension.  Was admitted March 26 with hypertensive crisis, hypertensive encephalopathy.  Work-up negative for stroke at that time.  Home medications of metoprolol 50 mg twice daily were continued irbesartan 75 mg was increased to 150 mg daily, and hydralazine was added 50 mg 3 times daily.  Creatinine remained at baseline range.  Last creatinine 1.52 with EGFR 53 on March 28.  Cardiology Dr. Meda Coffee.  Office visit in January.  Has used Lasix previously for peripheral edema as well as recommendations for compression stockings for peripheral edema, but those were too tight to be tolerated in the past.  HCTZ changed to lasix in past with LE edema.   Telemedicine visit April 1.  Variable home readings ranging from 120s to 180/60-104.  Had been losing some weight but thought to be due to less intake.  Was eating better at home drinking fluids.  Home blood sugar readings range from 82-170.  Asymptomatic at that time.  Handout given on how to effectively check blood pressure, decided to remain on same regimen given relative low/normal readings at times.  since last visit: Increased to 165m BID of metoprolol on 4/15 as higher readings persisted.  Ran out of hydralazine 4 days ago. Has remained on irbesartan 1538mqd (morning).    Home readings  reviewed, April 15 208/113, started extra 50 mg of metoprolol twice per day at that time.  April 16 209/113, 207/95, April 17 149/88, 191/105 then lower reading of 141/70 512 hours later.  On April 20 blood pressure was 206/107 8:05 AM, 156/94 at 2:16 PM.  Since that time home blood pressures from 22 April through today have ranged from 156-  208/83-102.  BP 171-205/87-108 off the hydralazine.  Periodic ankle swelling - taking furosemide 20gm 2 times per week for swelling. Lasix 2070mast 2 days.   Requesting handicap placard, last completed 2015. Trouble walking long distances due to knee and hip issues.   Patient Active Problem List   Diagnosis Date Noted  . Hypertensive encephalopathy 10/19/2018  . Chronic kidney disease, stage 3 ([place]CC[place]3/26/2020  . DM (diabetes mellitus), type 2 with renal complications ([place]CC[place]3/40/98/1191 Cough 03/17/2018  . Lower resp. tract infection 03/17/2018  . Sleep apnea with use of continuous positive airway pressure (CPAP) 01/04/2018  . Malnutrition of moderate degree 07/14/2017  . Thrush 07/11/2017  . FTT (failure to thrive) in adult 07/11/2017  . 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 CAD (coronary artery disease) 05/25/2017  . Unstable angina ([place] . Hypertensive heart disease 07/14/2015  . Lower extremity edema 07/14/2015  . Aortic stenosis 01/09/2015  . OSA on CPAP 12/12/2014  . Hypersomnia with sleep apnea 12/12/2014  . Obstructive sleep apnea (adult) (pediatric)  02/04/2014  . 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 Obesity 02/04/2014  . Mitral regurgitation and aortic stenosis 01/02/2014  . DOE (dyspnea on exertion) 11/26/2013  . Gout 08/12/2011  . Hypertension 08/12/2011  . High cholesterol 08/12/2011  . OSA (obstructive sleep apnea) 08/12/2011  . Diabetes type 2, uncontrolled ([place]) 08/12/2011   Past Medical History:  Diagnosis Date  . Arthritis    "knees" (07/11/2017)  . CKD (chronic kidney disease), stage III ([place])   . Coronary artery disease     a.  CABGx3 in 05/2017.  Marland Kitchen Gout   . Heart murmur   . High cholesterol   . Hypertension   . Mild aortic stenosis   . Mild mitral regurgitation   . OSA on CPAP   . Postoperative atrial fibrillation ([place]) 06/2017  . Sleep apnea   . Type II diabetes mellitus ([place])    Past Surgical History:  Procedure Laterality Date  . CARDIAC CATHETERIZATION    . COLONOSCOPY  2008  . CORONARY ARTERY BYPASS GRAFT N/A 06/24/2017   Procedure: CORONARY ARTERY BYPASS GRAFTING (CABG) x 3 ; Using Left Internal Mammary Artery and Right Great Saphenous Leg Vein Harvested Endoscopically;  Surgeon: Gaye Pollack, MD;  Location: [place] OR;  Service: Open Heart Surgery;  Laterality: N/A;  . EYE SURGERY    . LEFT HEART CATH AND CORONARY ANGIOGRAPHY N/A 05/25/2017   Procedure: LEFT HEART CATH AND CORONARY ANGIOGRAPHY;  Surgeon: Wellington Hampshire, MD;  Location: [place] [facility];  Service: Cardiovascular;  Laterality: N/A;  . TEE WITHOUT CARDIOVERSION N/A 06/24/2017   Procedure: TRANSESOPHAGEAL ECHOCARDIOGRAM (TEE);  Surgeon: Gaye Pollack, MD;  Location: [facility];  Service: Open Heart Surgery;  Laterality: N/A;   Allergies  Allergen Reactions  . Penicillins Other (See Comments)    Has patient had a PCN reaction causing immediate rash, facial/tongue/throat swelling, SOB or lightheadedness with hypotension: No Has patient had a PCN reaction causing severe rash involving mucus membranes or skin necrosis: No Has patient had a PCN reaction that required hospitalization: No Has patient had a PCN reaction occurring within the last 10 years: No If all of the above answers are "NO", then may proceed with Cephalosporin use.   Passed out ? SYNCOPE ?  Marland Kitchen Lisinopril Cough  . Amiodarone Nausea And Vomiting  . Amlodipine Other (See Comments)    Causes dizzines  . Oxycodone Other (See Comments)    hallucinations  . Zofran [Ondansetron Hcl] Other (See Comments)    Has an interaction with another medication pt takes   .  Cortisone Nausea And Vomiting and Other (See Comments)    HICCUPS  . Tramadol Nausea And Vomiting and Other (See Comments)    HICCUPS   Prior to Admission medications   Medication Sig Start Date End Date Taking? Authorizing Provider  acetaminophen (TYLENOL) 325 MG tablet Take 650 mg by mouth every 6 (six) hours as needed for mild pain.   Yes [provider]  aspirin EC 81 MG tablet Take 1 tablet (81 mg total) by mouth daily. 09/02/17  Yes Dunn, Dayna N, PA-C  atorvastatin (LIPITOR) 80 MG tablet Take 1 tablet (80 mg total) by mouth daily at 6 PM. 08/17/18  Yes [name], MD  colchicine 0.6 MG tablet Take 1-2 tablets (0.6-1.2 mg total) by mouth daily as needed (for gout flare). 2 po at onset of flair and then repeat in 1 h for acute gout attack 12/30/17  Yes [name], MD  furosemide (LASIX)  20 MG tablet Take 1 tablet (20 mg total) by mouth daily as needed for fluid or edema. 10/12/18  Yes [name], MD  glipiZIDE (GLUCOTROL) 5 MG tablet Take 1 tablet (5 mg total) by mouth daily before breakfast. 08/14/18  Yes [name], MD  metFORMIN (GLUCOPHAGE) 500 MG tablet Take 1 tablet (500 mg total) by mouth 2 (two) times daily with a meal. 08/14/18  Yes [name], MD  metoprolol tartrate (LOPRESSOR) 50 MG tablet Take 1 tablet (50 mg total) by mouth 2 (two) times daily. 08/17/18  Yes [name], MD  irbesartan (AVAPRO) 150 MG tablet Take 1 tablet (150 mg total) by mouth daily for 30 days. 10/21/18 11/20/18  Radene Gunning, NP   Social History   Socioeconomic History  . Marital status: Married    Spouse name: [name]  . Number of children: 3  . Years of education: 2  . Highest education level: Not on file  Occupational History  . Occupation: Musician  Social Needs  . Financial resource strain: Not on file  . Food insecurity:    Worry: Not on file    Inability: Not on file  . Transportation needs:    Medical: Not on file    Non-medical: Not on file   Tobacco Use  . Smoking status: Former Smoker    Packs/day: 2.00    Years: 15.00    Pack years: 30.00    Types: Cigarettes    Last attempt to quit: 07/26/1998    Years since quitting: 20.3  . Smokeless tobacco: Never Used  Substance and Sexual Activity  . Alcohol use: No    Alcohol/week: 0.0 standard drinks  . Drug use: No  . Sexual activity: Yes  Lifestyle  . Physical activity:    Days per week: Not on file    Minutes per session: Not on file  . Stress: Not on file  Relationships  . Social connections:    Talks on phone: Not on file    Gets together: Not on file    Attends religious service: Not on file    Active member of club or organization: Not on file    Attends meetings of clubs or organizations: Not on file    Relationship status: Not on file  . Intimate partner violence:    Fear of current or ex partner: Not on file    Emotionally abused: Not on file    Physically abused: Not on file    Forced sexual activity: Not on file  Other Topics Concern  . Not on file  Social History Narrative   Patient is married [name]) and lives at home with his wife.   Patient has three adult children.   Patient is retired.   Patient is a Research officer, political party.   Patient is right-handed.   Patient drinks one cup of coffee daily and 2-3 sodas per day.       Review of Systems  Constitutional: Negative for fatigue and unexpected weight change.  Eyes: Negative for visual disturbance.  Respiratory: Negative for cough, chest tightness and shortness of breath.   Cardiovascular: Negative for chest pain, palpitations and leg swelling.  Gastrointestinal: Negative for abdominal pain and blood in stool.  Neurological: Negative for dizziness, light-headedness and headaches.       Objective:   Physical Exam Vitals signs reviewed.  Constitutional:      Appearance: He is well-developed.  HENT:     Head: Normocephalic and atraumatic.  Eyes:     Pupils: Pupils are equal, round, and reactive to  light.  Neck:     Vascular: No carotid bruit or JVD.  Cardiovascular:     Rate and Rhythm: Normal rate and regular rhythm.     Heart sounds: Normal heart sounds. No murmur.  Pulmonary:     Effort: Pulmonary effort is normal.     Breath sounds: Normal breath sounds. No rales.  Skin:    General: Skin is warm and dry.  Neurological:     Mental Status: He is alert and oriented to person, place, and time.    Vitals:   11/28/18 1452 11/28/18 1457  BP: (!) 198/73 (!) 188/80  Pulse: 69   Resp: 16   Temp: 98.3 F (36.8 C)   TempSrc: Oral   SpO2: 99%   Weight: 192 lb 12.8 oz (87.5 kg)   Height: 5' 3"  (1.6 m)       Assessment & Plan:   BARTOW ZYLSTRA is a 73 y.o. male Labile hypertension - Plan: Basic metabolic panel, Ambulatory referral to Nephrology  CKD (chronic kidney disease) stage 3, GFR 30-59 ml/min ([place]) - Plan: Ambulatory referral to Nephrology  Essential hypertension - Plan: irbesartan (AVAPRO) 150 MG tablet  Still some significant variability in blood pressures, but has been remaining high overall with higher readings in the morning.  Asymptomatic at present.  History of CKD.  -Change irbesartan to evening dosing, add HCTZ back at 12.5 mg every morning.  Continue metoprolol same dose for now, but warned about orthostatic hypotension symptoms, lightheadedness or dizziness.  If those were to occur would revert back to lower dose of metoprolol initially.  -Update with home readings over the next 1 week  -Repeat BMP as on higher dose of ARB, and refer to nephrology  -ER precautions given   Meds ordered this encounter  Medications  . hydrochlorothiazide (HYDRODIURIL) 12.5 MG tablet    Sig: Take 1 tablet (12.5 mg total) by mouth daily.    Dispense:  90 tablet    Refill:  1  . irbesartan (AVAPRO) 150 MG tablet    Sig: Take 1 tablet (150 mg total) by mouth daily.    Dispense:  90 tablet    Refill:  1   Patient Instructions    As home readings appear to be higher  in the morning and in the evening, try changing irbesartan to the evening, start hydrochlorothiazide once each morning, watch for low blood pressure symptoms, lightheadedness, or dizziness, especially when first standing.  Continue metoprolol same dose for right now.  If you do start having lightheadedness or dizziness with standing we may need to decrease metoprolol first.  Okay to use furosemide if needed but I expect that to be less with restarting the diuretic hydrochlorothiazide.  Additionally I will check your kidney function today, and refer you to nephrology to evaluate the kidney function and possibly help with management with blood pressure.  Follow-up with a telemedicine visit in the next 1 week with home blood pressure readings.  Return to the clinic or go to the nearest emergency room if any of your symptoms worsen or new symptoms occur.   If you have lab work done today you will be contacted with your lab results within the next 2 weeks.  If you have not heard from [place] then please contact us. The fastest way to get your results is to register for My Chart.   IF you received an x-ray today, you  will receive an invoice from [facility] Radiology. Please contact [facility] Radiology at [phone] with questions or concerns regarding your invoice.   IF you received labwork today, you will receive an invoice from [place]. Please contact [facility] at [phone] with questions or concerns regarding your invoice.   Our billing staff will not be able to assist you with questions regarding bills from these companies.  You will be contacted with the lab results as soon as they are available. The fastest way to get your results is to activate your My Chart account. Instructions are located on the last page of this paperwork. If you have not heard from [place] regarding the results in 2 weeks, please contact this office.       Signed,   [name], MD [facility].  11/28/18 6:00 PM

## 2018-11-29 LAB — BASIC METABOLIC PANEL
BUN/Creatinine Ratio: 13 (ref 10–24)
BUN: 21 mg/dL (ref 8–27)
CO2: 23 mmol/L (ref 20–29)
Calcium: 9.1 mg/dL (ref 8.6–10.2)
Chloride: 105 mmol/L (ref 96–106)
Creatinine, Ser: 1.65 mg/dL — ABNORMAL HIGH (ref 0.76–1.27)
GFR calc Af Amer: 48 mL/min/{1.73_m2} — ABNORMAL LOW (ref >59)
GFR calc non Af Amer: 41 mL/min/{1.73_m2} — ABNORMAL LOW (ref >59)
Glucose: 150 mg/dL — ABNORMAL HIGH (ref 65–99)
Potassium: 4.3 mmol/L (ref 3.5–5.2)
Sodium: 143 mmol/L (ref 134–144)

## 2018-12-05 ENCOUNTER — Telehealth (INDEPENDENT_AMBULATORY_CARE_PROVIDER_SITE_OTHER): Payer: Medicare Other | Admitting: Family Medicine

## 2018-12-05 ENCOUNTER — Other Ambulatory Visit: Payer: Self-pay

## 2018-12-05 DIAGNOSIS — R7989 Other specified abnormal findings of blood chemistry: Secondary | ICD-10-CM

## 2018-12-05 DIAGNOSIS — R0989 Other specified symptoms and signs involving the circulatory and respiratory systems: Secondary | ICD-10-CM

## 2018-12-05 NOTE — Progress Notes (Signed)
Virtual Visit via video Note  I connected with Antonio Wells on 12/05/18 at 11:10 AM by video - [employer] and verified that I am speaking with the correct person using two identifiers.   I discussed the limitations, risks, security and privacy concerns of performing an evaluation and management service by telephone and the availability of in person appointments. I also discussed with the patient that there may be a patient responsible charge related to this service. The patient expressed understanding and agreed to proceed, consent obtained  Chief complaint:  HTN  History of Present Illness: Hypertension: BP Readings from Last 3 Encounters:  11/28/18 (!) 188/80  10/21/18 (!) 153/64  10/12/18 (!) 160/72   Lab Results  Component Value Date   CREATININE 1.65 (H) 11/28/2018   Treated recently for labile HTN. Last OV 5/5. added HCTZ 12.5mg  QD.  Now taking the irbesartan in the evening.  Still on metoprolol 100mg  BID.  BP has been doing better.  Elevated this am - 191/110.  But that was prior to meds. Recheck 1 hr later was lower - does not remember reading.  2 days ago 125/82. 154/95 in am. 143/93.  No dizziness, no new side effects.  Referred to nephrology but no call back yet.  Has taken furosemide in past (just as needed for swelling). Not taken recently.   Constitutional: Negative for fatigue and unexpected weight change.  Eyes: Negative for visual disturbance.  Respiratory: Negative for cough, chest tightness and shortness of breath.   Cardiovascular: Negative for chest pain, palpitations and leg swelling.  Gastrointestinal: Negative for abdominal pain and blood in stool.  Neurological: Negative for dizziness, light-headedness and headaches.        Patient Active Problem List   Diagnosis Date Noted   Hypertensive encephalopathy 10/19/2018   Chronic kidney disease, stage 3 ([place]) 10/19/2018   DM (diabetes mellitus), type 2 with renal complications ([place])  55/73/2202   Cough 03/17/2018   Lower resp. 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   CAD (coronary artery disease) 05/25/2017   Unstable angina (HCC)    Hypertensive heart disease 07/14/2015   Lower extremity edema 07/14/2015   Aortic stenosis 01/09/2015   OSA on CPAP 12/12/2014   Hypersomnia with sleep apnea 12/12/2014   Obstructive sleep apnea (adult) (pediatric) 02/04/2014   Hypersomnia, persistent 02/04/2014   Obesity 02/04/2014   Mitral regurgitation and aortic stenosis 01/02/2014   DOE (dyspnea on exertion) 11/26/2013   Gout 08/12/2011   Hypertension 08/12/2011   High cholesterol 08/12/2011   OSA (obstructive sleep apnea) 08/12/2011   Diabetes type 2, uncontrolled ([place]) 08/12/2011   Past Medical History:  Diagnosis Date   Arthritis    "knees" (07/11/2017)   CKD (chronic kidney disease), stage III (HCC)    Coronary artery disease    a.  CABGx3 in 05/2017.   Gout    Heart murmur    High cholesterol    Hypertension    Mild aortic stenosis    Mild mitral regurgitation    OSA on CPAP    Postoperative atrial fibrillation ([place]) 06/2017   Sleep apnea    Type II diabetes mellitus [facility])    Past Surgical History:  Procedure Laterality Date   CARDIAC CATHETERIZATION     COLONOSCOPY  2008  CORONARY ARTERY BYPASS GRAFT N/A 06/24/2017   Procedure: CORONARY ARTERY BYPASS GRAFTING (CABG) x 3 ; Using Left Internal Mammary Artery and Right Great Saphenous Leg Vein Harvested Endoscopically;  Surgeon: Gaye Pollack, MD;  Location: [facility];  Service: Open Heart Surgery;  Laterality: N/A;   EYE SURGERY     LEFT HEART CATH AND CORONARY ANGIOGRAPHY N/A 05/25/2017   Procedure: LEFT HEART CATH AND CORONARY ANGIOGRAPHY;  Surgeon: Wellington Hampshire, MD;  Location: [facility];  Service: Cardiovascular;  Laterality: N/A;   TEE WITHOUT CARDIOVERSION N/A 06/24/2017   Procedure: TRANSESOPHAGEAL ECHOCARDIOGRAM (TEE);  Surgeon: Gaye Pollack, MD;  Location: [facility];  Service: Open Heart Surgery;  Laterality: N/A;   Allergies  Allergen Reactions   Penicillins Other (See Comments)    Has patient had a PCN reaction causing immediate rash, facial/tongue/throat swelling, SOB or lightheadedness with hypotension: No Has patient had a PCN reaction causing severe rash involving mucus membranes or skin necrosis: No Has patient had a PCN reaction that required hospitalization: No Has patient had a PCN reaction occurring within the last 10 years: No If all of the above answers are "NO", then may proceed with Cephalosporin use.   Passed out ? SYNCOPE ?   Lisinopril Cough   Amiodarone Nausea And Vomiting   Amlodipine Other (See Comments)    Causes dizzines   Oxycodone Other (See Comments)    hallucinations   Zofran [Ondansetron Hcl] Other (See Comments)    Has an interaction with another medication pt takes    Cortisone Nausea And Vomiting and Other (See Comments)    HICCUPS   Tramadol Nausea And Vomiting and Other (See Comments)    HICCUPS   Prior to Admission medications   Medication Sig Start Date End Date Taking? Authorizing Provider  acetaminophen (TYLENOL) 325 MG tablet Take 650 mg by mouth every 6 (six) hours as needed for mild pain.   Yes [provider]  aspirin EC 81 MG tablet Take 1 tablet (81 mg total) by mouth daily. 09/02/17  Yes Dunn, Dayna N, PA-C  atorvastatin (LIPITOR) 80 MG tablet Take 1 tablet (80 mg total) by mouth daily at 6 PM. 08/17/18  Yes [name], MD  colchicine 0.6 MG tablet Take 1-2 tablets (0.6-1.2 mg total) by mouth daily as needed (for gout flare). 2 po at onset of flair and then repeat in 1 h for acute gout attack 12/30/17  Yes [name], MD  furosemide (LASIX) 20  MG tablet Take 1 tablet (20 mg total) by mouth daily as needed for fluid or edema. 10/12/18  Yes [name], MD  glipiZIDE (GLUCOTROL) 5 MG tablet Take 1 tablet (5 mg total) by mouth daily before breakfast. 08/14/18  Yes [name], MD  hydrochlorothiazide (HYDRODIURIL) 12.5 MG tablet Take 1 tablet (12.5 mg total) by mouth daily. 11/28/18  Yes [name], MD  irbesartan (AVAPRO) 150 MG tablet Take 1 tablet (150 mg total) by mouth daily. 11/28/18  Yes [name], MD  metFORMIN (GLUCOPHAGE) 500 MG tablet Take 1 tablet (500 mg total) by mouth 2 (two) times daily with a meal. 08/14/18  Yes [name], MD  metoprolol tartrate (LOPRESSOR) 50 MG tablet Take 1 tablet (50 mg total) by mouth 2 (two) times daily. 08/17/18  Yes [name], MD   Social History   Socioeconomic History   Marital status: Married    Spouse name: [name]   Number of  children: 3   Years of education: 16   Highest education level: Not on file  Occupational History   Occupation: Musician  Social Designer, fashion/clothing strain: Not on file   Food insecurity:    Worry: Not on file    Inability: Not on file   Transportation needs:    Medical: Not on file    Non-medical: Not on file  Tobacco Use   Smoking status: Former Smoker    Packs/day: 2.00    Years: 15.00    Pack years: 30.00    Types: Cigarettes    Last attempt to quit: 07/26/1998    Years since quitting: 20.3   Smokeless tobacco: Never Used  Substance and Sexual Activity   Alcohol use: No    Alcohol/week: 0.0 standard drinks   Drug use: No   Sexual activity: Yes  Lifestyle   Physical activity:    Days per week: Not on file    Minutes per session: Not on file   Stress: Not on file  Relationships   Social connections:    Talks on phone: Not on file    Gets together: Not on file    Attends religious service: Not on file    Active member of club or organization: Not on file    Attends meetings of clubs or  organizations: Not on file    Relationship status: Not on file   Intimate partner violence:    Fear of current or ex partner: Not on file    Emotionally abused: Not on file    Physically abused: Not on file    Forced sexual activity: Not on file  Other Topics Concern   Not on file  Social History Narrative   Patient is married [name]) and lives at home with his wife.   Patient has three adult children.   Patient is retired.   Patient is a Research officer, political party.   Patient is right-handed.   Patient drinks one cup of coffee daily and 2-3 sodas per day.        Observations/Objective: No distress, normal responses, normal speech, no facial droop.   Assessment and Plan: Labile hypertension - Plan: Basic metabolic panel  Elevated serum creatinine - Plan: Basic metabolic panel  -Improved, still some lability in readings.  Reading of 125/82, concerning to increase regimen but if he has persistent elevated readings over 140/160 especially would recommend increasing HCTZ to 25 mg daily.  Orthostatic precautions if increases regimen  - slight bump in creatinine from 1.5-1.6 range, will check at lab only visit in 1 week with BP reading in office visit in 2 weeks.  Nephrology appointment pending.  RTC/ER precautions given  Follow Up Instructions:  1 week labs, 2 weeks office visit.   I discussed the assessment and treatment plan with the patient. The patient was provided an opportunity to ask questions and all were answered. The patient agreed with the plan and demonstrated an understanding of the instructions.   The patient was advised to call back or seek an in-person evaluation if the symptoms worsen or if the condition fails to improve as anticipated.  I provided 11 minutes of non-face-to-face time during this encounter.  Signed,   [name], MD [facility].  12/05/18

## 2018-12-05 NOTE — Progress Notes (Signed)
CC- HTN f/u- Blood pressure today was 191/112 at 9:00 am Yesterday 194/55 at 8:40am evening was 125/82. From last visit patient was informed to start taking the irbesartan in the evening which he has. No dizziness and no lightheadedness. Patient did restart the HCTZ. Patient stated he have not been having to take the  furosemide.

## 2018-12-05 NOTE — Patient Instructions (Addendum)
Continue irbesartan and metoprolol at same doses for now.  Continue hydrochlorothiazide 12.5 mg for now, unless blood pressures remain over 140/90, especially over 160 then double up to 25 mg total hydrochlorothiazide per day.  Watch for lightheadedness or dizziness or low blood pressures at that dose.  Follow-up in 1 week for a lab only visit and make sure they check your blood pressure at that time, then office visit with me in 2 weeks.    You should also be receiving a call from the nephrology office, but if you have not heard from them in the next week or 2, can check into that further.  Return to the clinic or go to the nearest emergency room if any of your symptoms worsen or new symptoms occur.    If you have lab work done today you will be contacted with your lab results within the next 2 weeks.  If you have not heard from [place] then please contact us. The fastest way to get your results is to register for My Chart.   IF you received an x-ray today, you will receive an invoice from [facility]. Please contact [facility] at [phone] with questions or concerns regarding your invoice.   IF you received labwork today, you will receive an invoice from [facility]. Please contact [facility] at [phone] with questions or concerns regarding your invoice.   Our billing staff will not be able to assist you with questions regarding bills from these companies.  You will be contacted with the lab results as soon as they are available. The fastest way to get your results is to activate your My Chart account. Instructions are located on the last page of this paperwork. If you have not heard from [place] regarding the results in 2 weeks, please contact this office.

## 2018-12-30 ENCOUNTER — Telehealth: Payer: Self-pay | Admitting: Family Medicine

## 2018-12-30 DIAGNOSIS — I10 Essential (primary) hypertension: Secondary | ICD-10-CM

## 2019-01-05 DIAGNOSIS — I129 Hypertensive chronic kidney disease with stage 1 through stage 4 chronic kidney disease, or unspecified chronic kidney disease: Secondary | ICD-10-CM | POA: Diagnosis not present

## 2019-01-05 DIAGNOSIS — N183 Chronic kidney disease, stage 3 (moderate): Secondary | ICD-10-CM | POA: Diagnosis not present

## 2019-01-06 ENCOUNTER — Other Ambulatory Visit: Payer: Self-pay | Admitting: Family Medicine

## 2019-01-06 DIAGNOSIS — E1122 Type 2 diabetes mellitus with diabetic chronic kidney disease: Secondary | ICD-10-CM

## 2019-01-06 DIAGNOSIS — I2510 Atherosclerotic heart disease of native coronary artery without angina pectoris: Secondary | ICD-10-CM

## 2019-01-07 ENCOUNTER — Other Ambulatory Visit: Payer: Self-pay | Admitting: Family Medicine

## 2019-01-10 NOTE — Telephone Encounter (Signed)
Patient says metoprolol tartrate (LOPRESSOR) 50 MG tablet is supposed to be prescribed as 200mg  per day. He also would like this updated script request to go to [address] [facility].

## 2019-01-11 ENCOUNTER — Other Ambulatory Visit: Payer: Self-pay

## 2019-01-11 DIAGNOSIS — I10 Essential (primary) hypertension: Secondary | ICD-10-CM

## 2019-01-11 MED ORDER — METOPROLOL TARTRATE 100 MG PO TABS: 100.0000 mg | ORAL_TABLET | Freq: Two times a day (BID) | ORAL | 3 refills | Status: DC

## 2019-01-11 NOTE — Telephone Encounter (Signed)
rx sent to pharmacy

## 2019-02-05 ENCOUNTER — Ambulatory Visit: Payer: Medicare Other | Admitting: Family Medicine

## 2019-02-07 ENCOUNTER — Ambulatory Visit (INDEPENDENT_AMBULATORY_CARE_PROVIDER_SITE_OTHER): Payer: Medicare Other | Admitting: Family Medicine

## 2019-02-07 ENCOUNTER — Ambulatory Visit: Payer: Medicare Other | Admitting: Family Medicine

## 2019-02-07 ENCOUNTER — Other Ambulatory Visit: Payer: Self-pay

## 2019-02-07 VITALS — BP 140/80 | HR 70 | Temp 98.2°F | Resp 14 | Wt 192.2 lb

## 2019-02-07 DIAGNOSIS — E785 Hyperlipidemia, unspecified: Secondary | ICD-10-CM

## 2019-02-07 DIAGNOSIS — I2510 Atherosclerotic heart disease of native coronary artery without angina pectoris: Secondary | ICD-10-CM | POA: Diagnosis not present

## 2019-02-07 DIAGNOSIS — R195 Other fecal abnormalities: Secondary | ICD-10-CM | POA: Diagnosis not present

## 2019-02-07 DIAGNOSIS — E1122 Type 2 diabetes mellitus with diabetic chronic kidney disease: Secondary | ICD-10-CM | POA: Diagnosis not present

## 2019-02-07 DIAGNOSIS — M25561 Pain in right knee: Secondary | ICD-10-CM

## 2019-02-07 DIAGNOSIS — I10 Essential (primary) hypertension: Secondary | ICD-10-CM | POA: Diagnosis not present

## 2019-02-07 DIAGNOSIS — M25562 Pain in left knee: Secondary | ICD-10-CM

## 2019-02-07 MED ORDER — METFORMIN HCL 500 MG PO TABS: ORAL_TABLET | ORAL | 2 refills | Status: DC

## 2019-02-07 MED ORDER — ATORVASTATIN CALCIUM 80 MG PO TABS: ORAL_TABLET | ORAL | 2 refills | Status: DC

## 2019-02-07 MED ORDER — HYDROCHLOROTHIAZIDE 12.5 MG PO TABS: 12.5000 mg | ORAL_TABLET | Freq: Every day | ORAL | 2 refills | Status: DC

## 2019-02-07 MED ORDER — METOPROLOL TARTRATE 100 MG PO TABS: 100.0000 mg | ORAL_TABLET | Freq: Two times a day (BID) | ORAL | 3 refills | Status: DC

## 2019-02-07 MED ORDER — FUROSEMIDE 20 MG PO TABS: 20.0000 mg | ORAL_TABLET | Freq: Every day | ORAL | 1 refills | Status: DC | PRN

## 2019-02-07 MED ORDER — GLIPIZIDE 5 MG PO TABS: ORAL_TABLET | ORAL | 2 refills | Status: DC

## 2019-02-07 NOTE — Progress Notes (Signed)
Subjective:    Patient ID: Antonio Wells, male    DOB: 10/07/1945, 72 y.o.   MRN: [id number]  HPI Antonio Wells is a 73 y.o. male Presents today for: Chief Complaint  Patient presents with   Hypertension    Patient states his bp at home has been running 140/80   Diabetes    Patient need to have an a1c done today and sent to Nephrologist   Hypertension: With CKD.  Followed by nephrology - Dr. [name]. Needs labs sent to nephrology from today.  Some lability in readings previously, telemedicine visit in May.  Option of increasing HCTZ to a full 25 mg daily with orthostatic precautions given. Currently on 12.5 mg HCTZ daily, Lopressor 100 mg twice daily, irbesartan 300 mg daily - increased by nephrology June 4th.   Lasix 20 mg as needed - taking up to daily. 3 times per week to daily.  Home readings 140/80.  No new side effects with meds.   BP Readings from Last 3 Encounters:  02/07/19 140/80  11/28/18 (!) 188/80  10/21/18 (!) 153/64   Lab Results  Component Value Date   CREATININE 1.65 (H) 11/28/2018   More oily stools: Past few weeks. No changes in diet, no new supplements.  No blood in stool  No abdominal pain.  Colonoscopy 05/2018. Polyps and hemorrhoids.   Knee pain: Long time. Seen by ortho in past, "bone on bone" cortisone shots in both knees and visco.- no relief.  Ortho: [facility]. No recent visit.  No locking or giving way.  Tylenol occasionally. Some improvement at times at bedtime.   Diabetes: Complicated by chronic kidney disease, microalbuminuria Currently on glipizide 5 mg every morning, metformin 500 mg twice daily.  He is on ARB and statin Microalbumin: 208 on 12/30/2017 Optho, foot exam, pneumovax: Pneumovax postponed, up-to-date on foot exam and ophthalmology exam Not checking home readings.   Lab Results  Component Value Date   HGBA1C 6.5 (H) 08/07/2018   HGBA1C 7.5 (A) 04/28/2018   HGBA1C 7.3 (H) 12/30/2017   Lab Results    Component Value Date   MICROALBUR 39.6 08/25/2015   LDLCALC 44 06/27/2018   CREATININE 1.65 (H) 11/28/2018   Hyperlipidemia: With history of CAD, cardiology Dr. Meda Coffee, appointment in January.  CABG x3 in November 2018 with postop atrial fib.  On aspirin, beta-blocker only at this time, did not tolerate amiodarone previously.  No recurrent A. fib on previous 30-day event monitor.  Doing well at his January visit.  Continued on aspirin, Lipitor same dose, plan one-year follow-up. Lab Results  Component Value Date   CHOL 124 06/27/2018   HDL 58 06/27/2018   LDLCALC 44 06/27/2018   TRIG 110 06/27/2018   CHOLHDL 2.1 06/27/2018   Lab Results  Component Value Date   ALT 22 10/20/2018   AST 22 10/20/2018   ALKPHOS 65 10/20/2018   BILITOT 0.8 10/20/2018  no new myalgias/side effects.     Review of Systems     Objective:   Physical Exam Vitals signs reviewed.  Constitutional:      Appearance: He is well-developed.  HENT:     Head: Normocephalic and atraumatic.  Eyes:     Pupils: Pupils are equal, round, and reactive to light.  Neck:     Vascular: No carotid bruit or JVD.  Cardiovascular:     Rate and Rhythm: Normal rate and regular rhythm.     Heart sounds: Normal heart sounds. No murmur.  Pulmonary:  Effort: Pulmonary effort is normal.     Breath sounds: Normal breath sounds. No rales.  Musculoskeletal:     Right knee: He exhibits effusion (min 1+). He exhibits normal range of motion, no LCL laxity and no MCL laxity. Tenderness found. Medial joint line tenderness noted.     Left knee: He exhibits normal range of motion, no effusion, no LCL laxity and no MCL laxity. Tenderness found. Medial joint line tenderness noted.  Skin:    General: Skin is warm and dry.  Neurological:     Mental Status: He is alert and oriented to person, place, and time.    Vitals:   02/07/19 0904 02/07/19 0905  BP: (!) 148/82 140/80  Pulse: 70   Resp: 14   Temp: 98.2 F (36.8 C)    TempSrc: Oral   SpO2: 100%   Weight: 192 lb 3.2 oz (87.2 kg)         Assessment & Plan:    Antonio Wells is a 73 y.o. male Essential hypertension - Plan: Comprehensive metabolic panel, metoprolol tartrate (LOPRESSOR) 100 MG tablet, furosemide (LASIX) 20 MG tablet  - improved control.   - continue same regimen. Labs above with follow up with nephrology.   Type 2 diabetes mellitus with chronic kidney disease, without long-term current use of insulin, unspecified CKD stage ([place]) - Plan: Hemoglobin A1c, Microalbumin / creatinine urine ratio, glipiZIDE (GLUCOTROL) 5 MG tablet, metFORMIN (GLUCOPHAGE) 500 MG tablet  -Tolerating current regimen, check labs.  No changes for now.  Hyperlipidemia, unspecified hyperlipidemia type - Plan: Lipid Panel, Coronary artery disease involving native heart without angina pectoris, unspecified vessel or lesion type - Plan: atorvastatin (LIPITOR) 80 MG tablet  -Check lipid panel, continue Lipitor same dose for now with ongoing follow-up with cardiology as planned.  Change in stool  -Reports recent change to more orally stools but no blood in stool or other intestinal issues, no abdominal pain.  If persistent symptoms in the next week or 2, recommend he follow-up with his gastroenterologist, referral can be placed if needed.  RTC precautions if worse  Pain in both knees, unspecified chronicity  -Suspect component of degenerative joint disease.  Treated by orthopedics in the past, reportedly minimal improvement with corticosteroid and Visco supplementation.  Tylenol okay for now, but I did recommend he follow back up with orthopedics to decide on treatment options as ultimately may need total joint replacement depending on debility/pain.   Meds ordered this encounter  Medications   atorvastatin (LIPITOR) 80 MG tablet    Sig: TAKE 1 TABLET BY MOUTH  DAILY AT 6 PM.    Dispense:  90 tablet    Refill:  2   glipiZIDE (GLUCOTROL) 5 MG tablet    Sig: TAKE 1  TABLET BY MOUTH  DAILY BEFORE BREAKFAST    Dispense:  90 tablet    Refill:  2   metFORMIN (GLUCOPHAGE) 500 MG tablet    Sig: TAKE 1 TABLET BY MOUTH TWO  TIMES DAILY WITH A MEAL    Dispense:  180 tablet    Refill:  2   metoprolol tartrate (LOPRESSOR) 100 MG tablet    Sig: Take 1 tablet (100 mg total) by mouth 2 (two) times daily.    Dispense:  180 tablet    Refill:  3   hydrochlorothiazide (HYDRODIURIL) 12.5 MG tablet    Sig: Take 1 tablet (12.5 mg total) by mouth daily.    Dispense:  90 tablet    Refill:  2  furosemide (LASIX) 20 MG tablet    Sig: Take 1 tablet (20 mg total) by mouth daily as needed for fluid or edema.    Dispense:  90 tablet    Refill:  1   Patient Instructions   I would recommend meeting with your gastroenterologist if oily stools/change in stools persist.   No med changes for now with diabetes.   You can try glucosamine chondroitin for arthritis and knee pain. Tylenol is ok as needed, but follow up with [facility] to discuss options.    Chronic Knee Pain, Adult Chronic knee pain is pain in one or both knees that lasts longer than 3 months. Symptoms of chronic knee pain may include swelling, stiffness, and discomfort. Age-related wear and tear (osteoarthritis) of the knee joint is the most common cause of chronic knee pain. Other possible causes include:  A long-term immune-related disease that causes inflammation of the knee (rheumatoid arthritis). This usually affects both knees.  Inflammatory arthritis, such as gout or pseudogout.  An injury to the knee that causes arthritis.  An injury to the knee that damages the ligaments. Ligaments are strong tissues that connect bones to each other.  Runner's knee or pain behind the kneecap. Treatment for chronic knee pain depends on the cause. The main treatments for chronic knee pain are physical therapy and weight loss. This condition may also be treated with medicines, injections, a knee sleeve or  brace, and by using crutches. Rest, ice, compression (pressure), and elevation (RICE) therapy may also be recommended. Follow these instructions at home: If you have a knee sleeve or brace:   Wear it as told by your health care provider. Remove it only as told by your health care provider.  Loosen it if your toes tingle, become numb, or turn cold and blue.  Keep it clean.  If the sleeve or brace is not waterproof: ? Do not let it get wet. ? Remove it if allowed by your health care provider, or cover it with a watertight covering when you take a bath or a shower. Managing pain, stiffness, and swelling      If directed, apply heat to the affected area as often as told by your health care provider. Use the heat source that your health care provider recommends, such as a moist heat pack or a heating pad. ? If you have a removable sleeve or brace, remove it as told by your health care provider. ? Place a towel between your skin and the heat source. ? Leave the heat on for 20-30 minutes. ? Remove the heat if your skin turns bright red. This is especially important if you are unable to feel pain, heat, or cold. You may have a greater risk of getting burned.  If directed, put ice on the affected area. ? If you have a removable sleeve or brace, remove it as told by your health care provider. ? Put ice in a plastic bag. ? Place a towel between your skin and the bag. ? Leave the ice on for 20 minutes, 2-3 times a day.  Move your toes often to reduce stiffness and swelling.  Raise (elevate) the injured area above the level of your heart while you are sitting or lying down. Activity  Avoid activities where both feet leave the ground at the same time (high-impact activities). Examples are running, jumping rope, and doing jumping jacks.  Return to your normal activities as told by your health care provider. Ask your health care  provider what activities are safe for you.  Follow the exercise  plan that your health care provider designed for you. Your health care provider may suggest that you: ? Avoid activities that make knee pain worse. This may require you to change your exercise routines, sport participation, or job duties. ? Wear shoes with cushioned soles. ? Avoid high-impact activities or sports that require running and sudden changes in direction. ? Do physical therapy as told by your health care provider. Physical therapy is planned to match your needs and abilities. It may include exercises for strength, flexibility, stability, and endurance. ? Do exercises that increase balance and strength, such as tai chi and yoga.  Do not use the injured limb to support your body weight until your health care provider says that you can. Use crutches, a cane, or a walker, as told by your health care provider. General instructions  Take over-the-counter and prescription medicines only as told by your health care provider.  Lose weight if you are overweight. Losing even a little weight can reduce knee pain. Ask your health care provider what your ideal weight is, and how to safely lose extra weight. A food expert (dietitian) may be able to help you plan your meals.  Do not use any products that contain nicotine or tobacco, such as cigarettes, e-cigarettes, and chewing tobacco. These can delay healing. If you need help quitting, ask your health care provider.  Keep all follow-up visits as told by your health care provider. This is important. Contact a health care provider if:  You have knee pain that is not getting better or gets worse.  You are unable to do your physical therapy exercises due to knee pain. Get help right away if:  Your knee swells and the swelling becomes worse.  You cannot move your knee.  You have severe knee pain. Summary  Knee pain that lasts more than 3 months is considered chronic knee pain.  The main treatments for chronic knee pain are physical therapy  and weight loss. You may also need to take medicines, wear a knee sleeve or brace, use crutches, and apply ice or heat.  Losing even a little weight can reduce knee pain. Ask your health care provider what your ideal weight is, and how to safely lose extra weight. A food expert (dietitian) may be able to help you plan your meals.  Work with a physical therapist to make a safe exercise program, as told by your health care provider. This information is not intended to replace advice given to you by your health care provider. Make sure you discuss any questions you have with your health care provider. Document Released: 09/21/2018 Document Revised: 09/21/2018 Document Reviewed: 09/21/2018 Elsevier Patient Education  [employer].    If you have lab work done today you will be contacted with your lab results within the next 2 weeks.  If you have not heard from [place] then please contact us. The fastest way to get your results is to register for My Chart.   IF you received an x-ray today, you will receive an invoice from [facility]. Please contact [facility] at [phone] with questions or concerns regarding your invoice.   IF you received labwork today, you will receive an invoice from [place]. Please contact [facility] at [phone] with questions or concerns regarding your invoice.   Our billing staff will not be able to assist you with questions regarding bills from these companies.  You will be contacted  with the lab results as soon as they are available. The fastest way to get your results is to activate your My Chart account. Instructions are located on the last page of this paperwork. If you have not heard from [place] regarding the results in 2 weeks, please contact this office.       Signed,   [name], MD [facility] [place].  02/08/19 8:10 AM

## 2019-02-07 NOTE — Patient Instructions (Addendum)
I would recommend meeting with your gastroenterologist if oily stools/change in stools persist.   No med changes for now with diabetes.   You can try glucosamine chondroitin for arthritis and knee pain. Tylenol is ok as needed, but follow up with [facility] to discuss options.    Chronic Knee Pain, Adult Chronic knee pain is pain in one or both knees that lasts longer than 3 months. Symptoms of chronic knee pain may include swelling, stiffness, and discomfort. Age-related wear and tear (osteoarthritis) of the knee joint is the most common cause of chronic knee pain. Other possible causes include:  A long-term immune-related disease that causes inflammation of the knee (rheumatoid arthritis). This usually affects both knees.  Inflammatory arthritis, such as gout or pseudogout.  An injury to the knee that causes arthritis.  An injury to the knee that damages the ligaments. Ligaments are strong tissues that connect bones to each other.  Runner's knee or pain behind the kneecap. Treatment for chronic knee pain depends on the cause. The main treatments for chronic knee pain are physical therapy and weight loss. This condition may also be treated with medicines, injections, a knee sleeve or brace, and by using crutches. Rest, ice, compression (pressure), and elevation (RICE) therapy may also be recommended. Follow these instructions at home: If you have a knee sleeve or brace:   Wear it as told by your health care provider. Remove it only as told by your health care provider.  Loosen it if your toes tingle, become numb, or turn cold and blue.  Keep it clean.  If the sleeve or brace is not waterproof: ? Do not let it get wet. ? Remove it if allowed by your health care provider, or cover it with a watertight covering when you take a bath or a shower. Managing pain, stiffness, and swelling      If directed, apply heat to the affected area as often as told by your health care  provider. Use the heat source that your health care provider recommends, such as a moist heat pack or a heating pad. ? If you have a removable sleeve or brace, remove it as told by your health care provider. ? Place a towel between your skin and the heat source. ? Leave the heat on for 20-30 minutes. ? Remove the heat if your skin turns bright red. This is especially important if you are unable to feel pain, heat, or cold. You may have a greater risk of getting burned.  If directed, put ice on the affected area. ? If you have a removable sleeve or brace, remove it as told by your health care provider. ? Put ice in a plastic bag. ? Place a towel between your skin and the bag. ? Leave the ice on for 20 minutes, 2-3 times a day.  Move your toes often to reduce stiffness and swelling.  Raise (elevate) the injured area above the level of your heart while you are sitting or lying down. Activity  Avoid activities where both feet leave the ground at the same time (high-impact activities). Examples are running, jumping rope, and doing jumping jacks.  Return to your normal activities as told by your health care provider. Ask your health care provider what activities are safe for you.  Follow the exercise plan that your health care provider designed for you. Your health care provider may suggest that you: ? Avoid activities that make knee pain worse. This may require you to change your  exercise routines, sport participation, or job duties. ? Wear shoes with cushioned soles. ? Avoid high-impact activities or sports that require running and sudden changes in direction. ? Do physical therapy as told by your health care provider. Physical therapy is planned to match your needs and abilities. It may include exercises for strength, flexibility, stability, and endurance. ? Do exercises that increase balance and strength, such as tai chi and yoga.  Do not use the injured limb to support your body weight  until your health care provider says that you can. Use crutches, a cane, or a walker, as told by your health care provider. General instructions  Take over-the-counter and prescription medicines only as told by your health care provider.  Lose weight if you are overweight. Losing even a little weight can reduce knee pain. Ask your health care provider what your ideal weight is, and how to safely lose extra weight. A food expert (dietitian) may be able to help you plan your meals.  Do not use any products that contain nicotine or tobacco, such as cigarettes, e-cigarettes, and chewing tobacco. These can delay healing. If you need help quitting, ask your health care provider.  Keep all follow-up visits as told by your health care provider. This is important. Contact a health care provider if:  You have knee pain that is not getting better or gets worse.  You are unable to do your physical therapy exercises due to knee pain. Get help right away if:  Your knee swells and the swelling becomes worse.  You cannot move your knee.  You have severe knee pain. Summary  Knee pain that lasts more than 3 months is considered chronic knee pain.  The main treatments for chronic knee pain are physical therapy and weight loss. You may also need to take medicines, wear a knee sleeve or brace, use crutches, and apply ice or heat.  Losing even a little weight can reduce knee pain. Ask your health care provider what your ideal weight is, and how to safely lose extra weight. A food expert (dietitian) may be able to help you plan your meals.  Work with a physical therapist to make a safe exercise program, as told by your health care provider. This information is not intended to replace advice given to you by your health care provider. Make sure you discuss any questions you have with your health care provider. Document Released: 09/21/2018 Document Revised: 09/21/2018 Document Reviewed: 09/21/2018 Elsevier  Patient Education  [employer].    If you have lab work done today you will be contacted with your lab results within the next 2 weeks.  If you have not heard from [place] then please contact us. The fastest way to get your results is to register for My Chart.   IF you received an x-ray today, you will receive an invoice from [facility]. Please contact [facility] at [phone] with questions or concerns regarding your invoice.   IF you received labwork today, you will receive an invoice from [place]. Please contact [facility] at [phone] with questions or concerns regarding your invoice.   Our billing staff will not be able to assist you with questions regarding bills from these companies.  You will be contacted with the lab results as soon as they are available. The fastest way to get your results is to activate your My Chart account. Instructions are located on the last page of this paperwork. If you have not heard from [place] regarding the  results in 2 weeks, please contact this office.

## 2019-02-08 ENCOUNTER — Encounter: Payer: Self-pay | Admitting: Family Medicine

## 2019-02-08 LAB — LIPID PANEL
Chol/HDL Ratio: 2.9 ratio (ref 0.0–5.0)
Cholesterol, Total: 135 mg/dL (ref 100–199)
HDL: 46 mg/dL (ref >39)
LDL Calculated: 66 mg/dL (ref 0–99)
Triglycerides: 114 mg/dL (ref 0–149)
VLDL Cholesterol Cal: 23 mg/dL (ref 5–40)

## 2019-02-08 LAB — COMPREHENSIVE METABOLIC PANEL
ALT: 19 IU/L (ref 0–44)
AST: 27 IU/L (ref 0–40)
Albumin/Globulin Ratio: 2 (ref 1.2–2.2)
Albumin: 4.7 g/dL (ref 3.7–4.7)
Alkaline Phosphatase: 71 IU/L (ref 39–117)
BUN/Creatinine Ratio: 19 (ref 10–24)
BUN: 35 mg/dL — ABNORMAL HIGH (ref 8–27)
Bilirubin Total: 0.4 mg/dL (ref 0.0–1.2)
CO2: 22 mmol/L (ref 20–29)
Calcium: 9.1 mg/dL (ref 8.6–10.2)
Chloride: 105 mmol/L (ref 96–106)
Creatinine, Ser: 1.84 mg/dL — ABNORMAL HIGH (ref 0.76–1.27)
GFR calc Af Amer: 41 mL/min/{1.73_m2} — ABNORMAL LOW (ref >59)
GFR calc non Af Amer: 36 mL/min/{1.73_m2} — ABNORMAL LOW (ref >59)
Globulin, Total: 2.4 g/dL (ref 1.5–4.5)
Glucose: 180 mg/dL — ABNORMAL HIGH (ref 65–99)
Potassium: 4.5 mmol/L (ref 3.5–5.2)
Sodium: 143 mmol/L (ref 134–144)
Total Protein: 7.1 g/dL (ref 6.0–8.5)

## 2019-02-08 LAB — MICROALBUMIN / CREATININE URINE RATIO
Creatinine, Urine: 131.8 mg/dL
Microalb/Creat Ratio: 76 mg/g creat — ABNORMAL HIGH (ref 0–29)
Microalbumin, Urine: 100.1 ug/mL

## 2019-02-08 LAB — HEMOGLOBIN A1C
Est. average glucose Bld gHb Est-mCnc: 160 mg/dL
Hgb A1c MFr Bld: 7.2 % — ABNORMAL HIGH (ref 4.8–5.6)

## 2019-03-05 ENCOUNTER — Telehealth: Payer: Self-pay | Admitting: Neurology

## 2019-03-05 NOTE — Telephone Encounter (Signed)
[facility] called asking if the prescription for the pt's cpap supplies have been received. Please advise.

## 2019-03-05 NOTE — Telephone Encounter (Signed)
I have received the orders and I have replied back to their order via fax and on go scripts informing them it has been over a year since see we have seen the patient. The patient has to follow up in office before we can order the supplies

## 2019-04-05 DIAGNOSIS — M1712 Unilateral primary osteoarthritis, left knee: Secondary | ICD-10-CM | POA: Diagnosis not present

## 2019-04-05 DIAGNOSIS — M1711 Unilateral primary osteoarthritis, right knee: Secondary | ICD-10-CM | POA: Diagnosis not present

## 2019-04-18 DIAGNOSIS — G4733 Obstructive sleep apnea (adult) (pediatric): Secondary | ICD-10-CM | POA: Diagnosis not present

## 2019-04-20 ENCOUNTER — Other Ambulatory Visit: Payer: Self-pay

## 2019-04-20 DIAGNOSIS — R6889 Other general symptoms and signs: Secondary | ICD-10-CM | POA: Diagnosis not present

## 2019-04-20 DIAGNOSIS — Z20822 Contact with and (suspected) exposure to covid-19: Secondary | ICD-10-CM

## 2019-04-21 LAB — NOVEL CORONAVIRUS, NAA: SARS-CoV-2, NAA: NOT DETECTED

## 2019-05-13 ENCOUNTER — Other Ambulatory Visit: Payer: Self-pay | Admitting: Family Medicine

## 2019-05-13 DIAGNOSIS — I10 Essential (primary) hypertension: Secondary | ICD-10-CM

## 2019-07-05 ENCOUNTER — Other Ambulatory Visit: Payer: Self-pay | Admitting: Family Medicine

## 2019-07-05 ENCOUNTER — Telehealth: Payer: Self-pay | Admitting: Family Medicine

## 2019-07-05 DIAGNOSIS — M109 Gout, unspecified: Secondary | ICD-10-CM

## 2019-07-05 MED ORDER — COLCHICINE 0.6 MG PO TABS: 0.6000 mg | ORAL_TABLET | Freq: Every day | ORAL | 0 refills | Status: DC | PRN

## 2019-07-05 NOTE — Telephone Encounter (Signed)
Pt need med to go to [facility] [address] not [facility]

## 2019-07-05 NOTE — Telephone Encounter (Signed)
Requested medication (s) are due for refill today: yes  Requested medication (s) are on the active medication list: yes  Last refill:  07/05/2019  Future visit scheduled: yes  Notes to clinic:  Pharmacy didn't receive script   Requested Prescriptions  Pending Prescriptions Disp Refills   colchicine 0.6 MG tablet [Pharmacy Med Name: COLCHICINE 0.6 MG TABLET] 30 tablet 0    Sig: TAKE 2 TABS ON ONSET OF GOUT FLARE THEN REPEAT IN 1 HOUR OF ACUTE GOUT ATTACK      Endocrinology:  Gout Agents Failed - 07/05/2019 12:15 PM      Failed - Uric Acid in normal range and within 360 days    Uric Acid  Date Value Ref Range Status  12/30/2017 6.8 3.7 - 8.6 mg/dL Final    Comment:               Therapeutic target for gout patients: <6.0          Failed - Cr in normal range and within 360 days    Creat  Date Value Ref Range Status  04/22/2016 1.34 (H) 0.70 - 1.25 mg/dL Final    Comment:      For patients > or = 72 years of age: The upper reference limit for Creatinine is approximately 13% higher for people identified as African-American.      Creatinine, Ser  Date Value Ref Range Status  02/07/2019 1.84 (H) 0.76 - 1.27 mg/dL Final          Passed - Valid encounter within last 12 months    Recent Outpatient Visits           4 months ago Essential hypertension   [facility] [place], MD   7 months ago Labile hypertension   [facility] [place], Ranell Patrick, MD   7 months ago Labile hypertension   [facility] [place], Ranell Patrick, MD   8 months ago Encephalopathy, hypertensive   [facility] [place] [facility], Ranell Patrick, MD   8 months ago Essential hypertension   [facility] [place] [facility], Ranell Patrick, MD       Future Appointments             In 1 month Carlota Raspberry Ranell Patrick, MD [facility] [place], [facility]   In 1 month [name], MD [facility], [facility]

## 2019-07-05 NOTE — Telephone Encounter (Signed)
Pt calling to follow up on the request for  colchicine 0.6 MG tablet   Pt states he is having a gout in his right foot. Pt having problems walking, so if pt neds appt, it will need to be virtual   [id number] [address], [address] Phone:  [phone]  Fax:  [phone]

## 2019-07-06 MED ORDER — COLCHICINE 0.6 MG PO TABS: ORAL_TABLET | ORAL | 0 refills | Status: DC

## 2019-07-06 NOTE — Addendum Note (Signed)
Addended by: Kittie Plater, Manaia Samad HUA on: 07/06/2019 02:31 PM   Modules accepted: Orders

## 2019-07-06 NOTE — Telephone Encounter (Signed)
I have sent in a buffer refill for a 30 day supply of medication.  colchicine 0.6 MG tablet     [id number] - [place], [place]   Pt will need office visit for additional refills.   Thanks

## 2019-07-11 ENCOUNTER — Telehealth: Payer: Self-pay | Admitting: Family Medicine

## 2019-07-11 NOTE — Telephone Encounter (Signed)
Called to schedule med refill appt . No answer lvm

## 2019-07-12 ENCOUNTER — Other Ambulatory Visit: Payer: Self-pay

## 2019-07-12 ENCOUNTER — Telehealth (INDEPENDENT_AMBULATORY_CARE_PROVIDER_SITE_OTHER): Payer: Medicare Other | Admitting: Family Medicine

## 2019-07-12 DIAGNOSIS — M109 Gout, unspecified: Secondary | ICD-10-CM

## 2019-07-12 NOTE — Progress Notes (Signed)
CC- med refill- Patient stated we already sent in his rx for his gout flare up so he is guessing this appt is to f/u. Patient stated his gout is better and have another appt scheduled with  Dr Carlota Raspberry 08/10/19

## 2019-07-12 NOTE — Patient Instructions (Signed)
Gout  Gout is a condition that causes painful swelling of the joints. Gout is a type of inflammation of the joints (arthritis). This condition is caused by having too much uric acid in the body. Uric acid is a chemical that forms when the body breaks down substances called purines. Purines are important for building body proteins. When the body has too much uric acid, sharp crystals can form and build up inside the joints. This causes pain and swelling. Gout attacks can happen quickly and may be very painful (acute gout). Over time, the attacks can affect more joints and become more frequent (chronic gout). Gout can also cause uric acid to build up under the skin and inside the kidneys. What are the causes? This condition is caused by too much uric acid in your blood. This can happen because:  Your kidneys do not remove enough uric acid from your blood. This is the most common cause.  Your body makes too much uric acid. This can happen with some cancers and cancer treatments. It can also occur if your body is breaking down too many red blood cells (hemolytic anemia).  You eat too many foods that are high in purines. These foods include organ meats and some seafood. Alcohol, especially beer, is also high in purines. A gout attack may be triggered by trauma or stress. What increases the risk? You are more likely to develop this condition if you:  Have a family history of gout.  Are male and middle-aged.  Are male and have gone through menopause.  Are obese.  Frequently drink alcohol, especially beer.  Are dehydrated.  Lose weight too quickly.  Have an organ transplant.  Have lead poisoning.  Take certain medicines, including aspirin, cyclosporine, diuretics, levodopa, and niacin.  Have kidney disease.  Have a skin condition called psoriasis. What are the signs or symptoms? An attack of acute gout happens quickly. It usually occurs in just one joint. The most common place is  the big toe. Attacks often start at night. Other joints that may be affected include joints of the feet, ankle, knee, fingers, wrist, or elbow. Symptoms of this condition may include:  Severe pain.  Warmth.  Swelling.  Stiffness.  Tenderness. The affected joint may be very painful to touch.  Shiny, red, or purple skin.  Chills and fever. Chronic gout may cause symptoms more frequently. More joints may be involved. You may also have white or yellow lumps (tophi) on your hands or feet or in other areas near your joints. How is this diagnosed? This condition is diagnosed based on your symptoms, medical history, and physical exam. You may have tests, such as:  Blood tests to measure uric acid levels.  Removal of joint fluid with a thin needle (aspiration) to look for uric acid crystals.  X-rays to look for joint damage. How is this treated? Treatment for this condition has two phases: treating an acute attack and preventing future attacks. Acute gout treatment may include medicines to reduce pain and swelling, including:  NSAIDs.  Steroids. These are strong anti-inflammatory medicines that can be taken by mouth (orally) or injected into a joint.  Colchicine. This medicine relieves pain and swelling when it is taken soon after an attack. It can be given by mouth or through an IV. Preventive treatment may include:  Daily use of smaller doses of NSAIDs or colchicine.  Use of a medicine that reduces uric acid levels in your blood.  Changes to your diet. You may   need to see a dietitian about what to eat and drink to prevent gout. Follow these instructions at home: During a gout attack   If directed, put ice on the affected area: ? Put ice in a plastic bag. ? Place a towel between your skin and the bag. ? Leave the ice on for 20 minutes, 2-3 times a day.  Raise (elevate) the affected joint above the level of your heart as often as possible.  Rest the joint as much as possible.  If the affected joint is in your leg, you may be given crutches to use.  Follow instructions from your health care provider about eating or drinking restrictions. Avoiding future gout attacks  Follow a low-purine diet as told by your dietitian or health care provider. Avoid foods and drinks that are high in purines, including liver, kidney, anchovies, asparagus, herring, mushrooms, mussels, and beer.  Maintain a healthy weight or lose weight if you are overweight. If you want to lose weight, talk with your health care provider. It is important that you do not lose weight too quickly.  Start or maintain an exercise program as told by your health care provider. Eating and drinking  Drink enough fluids to keep your urine pale yellow.  If you drink alcohol: ? Limit how much you use to:  0-1 drink a day for women.  0-2 drinks a day for men. ? Be aware of how much alcohol is in your drink. In the [place] one drink equals one 12 oz bottle of beer (355 mL) one 5 oz glass of wine (148 mL), or one 1 oz glass of hard liquor (44 mL). General instructions  Take over-the-counter and prescription medicines only as told by your health care provider.  Do not drive or use heavy machinery while taking prescription pain medicine.  Return to your normal activities as told by your health care provider. Ask your health care provider what activities are safe for you.  Keep all follow-up visits as told by your health care provider. This is important. Contact a health care provider if you have:  Another gout attack.  Continuing symptoms of a gout attack after 10 days of treatment.  Side effects from your medicines.  Chills or a fever.  Burning pain when you urinate.  Pain in your lower back or belly. Get help right away if you:  Have severe or uncontrolled pain.  Cannot urinate. Summary  Gout is painful swelling of the joints caused by inflammation.  The most common site of pain is the big  toe, but it can affect other joints in the body.  Medicines and dietary changes can help to prevent and treat gout attacks. This information is not intended to replace advice given to you by your health care provider. Make sure you discuss any questions you have with your health care provider. Document Released: 07/09/2000 Document Revised: 02/01/2018 Document Reviewed: 02/01/2018 Elsevier Patient Education  [place] A low-purine eating plan involves making food choices to limit your intake of purine. Purine is a kind of uric acid. Too much uric acid in your blood can cause certain conditions, such as gout and kidney stones. Eating a low-purine diet can help control these conditions. What are tips for following this plan? Reading food labels   Avoid foods with saturated or Trans fat.  Check the ingredient list of grains-based foods, such as bread and cereal, to make sure that they contain whole grains.  Check  the ingredient list of sauces or soups to make sure they do not contain meat or fish.  When choosing soft drinks, check the ingredient list to make sure they do not contain high-fructose corn syrup. Shopping  Buy plenty of fresh fruits and vegetables.  Avoid buying canned or fresh fish.  Buy dairy products labeled as low-fat or nonfat.  Avoid buying premade or processed foods. These foods are often high in fat, salt (sodium), and added sugar. Cooking  Use olive oil instead of butter when cooking. Oils like olive oil, canola oil, and sunflower oil contain healthy fats. Meal planning  Learn which foods do or do not affect you. If you find out that a food tends to cause your gout symptoms to flare up, avoid eating that food. You can enjoy foods that do not cause problems. If you have any questions about a food item, talk with your dietitian or health care provider.  Limit foods high in fat, especially saturated fat. Fat makes it harder for  your body to get rid of uric acid.  Choose foods that are lower in fat and are lean sources of protein. General guidelines  Limit alcohol intake to no more than 1 drink a day for nonpregnant women and 2 drinks a day for men. One drink equals 12 oz of beer, 5 oz of wine, or 1 oz of hard liquor. Alcohol can affect the way your body gets rid of uric acid.  Drink plenty of water to keep your urine clear or pale yellow. Fluids can help remove uric acid from your body.  If directed by your health care provider, take a vitamin C supplement.  Work with your health care provider and dietitian to develop a plan to achieve or maintain a healthy weight. Losing weight can help reduce uric acid in your blood. What foods are recommended? The items listed may not be a complete list. Talk with your dietitian about what dietary choices are best for you. Foods low in purines Foods low in purines do not need to be limited. These include:  All fruits.  All low-purine vegetables, pickles, and olives.  Breads, pasta, rice, cornbread, and popcorn. Cake and other baked goods.  All dairy foods.  Eggs, nuts, and nut butters.  Spices and condiments, such as salt, herbs, and vinegar.  Plant oils, butter, and margarine.  Water, sugar-free soft drinks, tea, coffee, and cocoa.  Vegetable-based soups, broths, sauces, and gravies. Foods moderate in purines Foods moderate in purines should be limited to the amounts listed.   cup of asparagus, cauliflower, spinach, mushrooms, or green peas, each day.  2/3 cup uncooked oatmeal, each day.   cup dry wheat bran or wheat germ, each day.  2-3 ounces of meat or poultry, each day.  4-6 ounces of shellfish, such as crab, lobster, oysters, or shrimp, each day.  1 cup cooked beans, peas, or lentils, each day.  Soup, broths, or bouillon made from meat or fish. Limit these foods as much as possible. What foods are not recommended? The items listed may not be a  complete list. Talk with your dietitian about what dietary choices are best for you. Limit your intake of foods high in purines, including:  Beer and other alcohol.  Meat-based gravy or sauce.  Canned or fresh fish, such as: ? Anchovies, sardines, herring, and tuna. ? Mussels and scallops. ? Codfish, trout, and haddock.  [place] Salines.  Organ meats, such as: ? Liver or kidney. ? Tripe. ? Sweetbreads (thymus  gland or pancreas).  [employer].  Yeast or yeast extract supplements.  Drinks sweetened with high-fructose corn syrup. Summary  Eating a low-purine diet can help control conditions caused by too much uric acid in the body, such as gout or kidney stones.  Choose low-purine foods, limit alcohol, and limit foods high in fat.  You will learn over time which foods do or do not affect you. If you find out that a food tends to cause your gout symptoms to flare up, avoid eating that food. This information is not intended to replace advice given to you by your health care provider. Make sure you discuss any questions you have with your health care provider. Document Released: 11/06/2010 Document Revised: 06/24/2017 Document Reviewed: 08/25/2016 Elsevier Patient Education  2020 [employer].

## 2019-07-12 NOTE — Progress Notes (Signed)
Virtual Visit via Telephone Note  I connected with Antonio Wells on 07/12/19 at 6:17 PM by telephone and verified that I am speaking with the correct person using two identifiers.   I discussed the limitations, risks, security and privacy concerns of performing an evaluation and management service by telephone and the availability of in person appointments. I also discussed with the patient that there may be a patient responsible charge related to this service. The patient expressed understanding and agreed to proceed, consent obtained  Chief complaint:  Gout  History of Present Illness: Antonio Wells is a 73 y.o. male History of hypertension, diabetes, hyperlipidemia, with planned follow-up in January to review chronic medical issues. Recently refilled colchicine, appointment was requested to review gout treatment.  Gout: Last flare: now -  currently started in mid R foot about 6 days ago. Started colchicine 3 days into flare. Getting better now - not quite gone yet. Able to walk without assistance now.  Dose: 2 initially, 1 in an hour, then 1 each day since.  Prior flare was 6-12 months ago.  Daily meds: None.  Of note he is on HCTZ 12.5 mg daily for hypertension. Prn med: Colchicine 0.6 mg.  Lab Results  Component Value Date   LABURIC 6.8 12/30/2017  he would like dietary advice for gout.     Patient Active Problem List   Diagnosis Date Noted  . Hypertensive encephalopathy 10/19/2018  . Chronic kidney disease, stage 3 10/19/2018  . DM (diabetes mellitus), type 2 with renal complications ([place]) [id number]  . Cough 03/17/2018  . Lower resp. tract infection 03/17/2018  . Sleep apnea with use of continuous positive airway pressure (CPAP) 01/04/2018  . Malnutrition of moderate degree 07/14/2017  . Thrush 07/11/2017  . FTT (failure to thrive) in adult 07/11/2017  . 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 CAD (coronary artery disease) 05/25/2017  . Unstable angina ([place])   .  Hypertensive heart disease 07/14/2015  . Lower extremity edema 07/14/2015  . Aortic stenosis 01/09/2015  . OSA on CPAP 12/12/2014  . Hypersomnia with sleep apnea 12/12/2014  . Obstructive sleep apnea (adult) (pediatric) 02/04/2014  . 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 Obesity 02/04/2014  . Mitral regurgitation and aortic stenosis 01/02/2014  . DOE (dyspnea on exertion) 11/26/2013  . Gout 08/12/2011  . Hypertension 08/12/2011  . High cholesterol 08/12/2011  . OSA (obstructive sleep apnea) 08/12/2011  . Diabetes type 2, uncontrolled ([place]) 08/12/2011   Past Medical History:  Diagnosis Date  . Arthritis    "knees" (07/11/2017)  . CKD (chronic kidney disease), stage III ([place])   . Coronary artery disease    a.  CABGx3 in 05/2017.  Marland Kitchen Gout   . Heart murmur   . High cholesterol   . Hypertension   . Mild aortic stenosis   . Mild mitral regurgitation   . OSA on CPAP   . Postoperative atrial fibrillation ([place]) 06/2017  . Sleep apnea   . Type II diabetes mellitus ([place])    Past Surgical History:  Procedure Laterality Date  . CARDIAC CATHETERIZATION    . COLONOSCOPY  2008  . CORONARY ARTERY BYPASS GRAFT N/A 06/24/2017   Procedure: CORONARY ARTERY BYPASS GRAFTING (CABG) x 3 ; Using Left Internal Mammary Artery and Right Great Saphenous Leg Vein Harvested Endoscopically;  Surgeon: Gaye Pollack, MD;  Location: [facility];  Service: Open Heart Surgery;  Laterality: N/A;  . EYE SURGERY    . LEFT HEART CATH AND CORONARY  ANGIOGRAPHY N/A 05/25/2017   Procedure: LEFT HEART CATH AND CORONARY ANGIOGRAPHY;  Surgeon: Wellington Hampshire, MD;  Location: [facility];  Service: Cardiovascular;  Laterality: N/A;  . TEE WITHOUT CARDIOVERSION N/A 06/24/2017   Procedure: TRANSESOPHAGEAL ECHOCARDIOGRAM (TEE);  Surgeon: Gaye Pollack, MD;  Location: [facility];  Service: Open Heart Surgery;  Laterality: N/A;   Allergies  Allergen Reactions  . Penicillins Other (See Comments)    Has patient had a PCN  reaction causing immediate rash, facial/tongue/throat swelling, SOB or lightheadedness with hypotension: No Has patient had a PCN reaction causing severe rash involving mucus membranes or skin necrosis: No Has patient had a PCN reaction that required hospitalization: No Has patient had a PCN reaction occurring within the last 10 years: No If all of the above answers are "NO", then may proceed with Cephalosporin use.   Passed out ? SYNCOPE ?  Marland Kitchen Lisinopril Cough  . Amiodarone Nausea And Vomiting  . Amlodipine Other (See Comments)    Causes dizzines  . Oxycodone Other (See Comments)    hallucinations  . Zofran [Ondansetron Hcl] Other (See Comments)    Has an interaction with another medication pt takes   . Cortisone Nausea And Vomiting and Other (See Comments)    HICCUPS  . Tramadol Nausea And Vomiting and Other (See Comments)    HICCUPS   Prior to Admission medications   Medication Sig Start Date End Date Taking? Authorizing Provider  acetaminophen (TYLENOL) 325 MG tablet Take 650 mg by mouth every 6 (six) hours as needed for mild pain.   Yes [provider]  aspirin EC 81 MG tablet Take 1 tablet (81 mg total) by mouth daily. 09/02/17  Yes Dunn, Dayna N, PA-C  atorvastatin (LIPITOR) 80 MG tablet TAKE 1 TABLET BY MOUTH  DAILY AT 6 PM. 02/07/19  Yes [name], MD  colchicine 0.6 MG tablet TAKE 2 TABS ON ONSET OF GOUT FLARE THEN REPEAT IN 1 HOUR OF ACUTE GOUT ATTACK 07/06/19  Yes [name], MD  furosemide (LASIX) 20 MG tablet Take 1 tablet (20 mg total) by mouth daily as needed for fluid or edema. 02/07/19  Yes [name], MD  glipiZIDE (GLUCOTROL) 5 MG tablet TAKE 1 TABLET BY MOUTH  DAILY BEFORE BREAKFAST 02/07/19  Yes [name], MD  hydrochlorothiazide (HYDRODIURIL) 12.5 MG tablet Take 1 tablet (12.5 mg total) by mouth daily. 02/07/19  Yes [name], MD  irbesartan (AVAPRO) 150 MG tablet TAKE ONE-HALF TABLET BY  MOUTH DAILY 05/14/19  Yes [name], MD  metFORMIN (GLUCOPHAGE) 500 MG tablet TAKE 1 TABLET BY MOUTH TWO  TIMES DAILY WITH A MEAL 02/07/19  Yes [name], MD  metoprolol tartrate (LOPRESSOR) 100 MG tablet Take 1 tablet (100 mg total) by mouth 2 (two) times daily. 02/07/19  Yes [name], MD   Social History   Socioeconomic History  . Marital status: Married    Spouse name: [name]  . Number of children: 3  . Years of education: 52  . Highest education level: Not on file  Occupational History  . Occupation: Musician  Tobacco Use  . Smoking status: Former Smoker    Packs/day: 2.00    Years: 15.00    Pack years: 30.00    Types: Cigarettes    Quit date: 07/26/1998    Years since quitting: 20.9  . Smokeless tobacco: Never Used  Substance and Sexual Activity  . Alcohol use: No  Alcohol/week: 0.0 standard drinks  . Drug use: No  . Sexual activity: Yes  Other Topics Concern  . Not on file  Social History Narrative   Patient is married [name]) and lives at home with his wife.   Patient has three adult children.   Patient is retired.   Patient is a Research officer, political party.   Patient is right-handed.   Patient drinks one cup of coffee daily and 2-3 sodas per day.      Social Determinants of Health   Financial Resource Strain:   . Difficulty of Paying Living Expenses: Not on file  Food Insecurity:   . Worried About Charity fundraiser in the Last Year: Not on file  . Ran Out of Food in the Last Year: Not on file  Transportation Needs:   . Lack of Transportation (Medical): Not on file  . Lack of Transportation (Non-Medical): Not on file  Physical Activity:   . Days of Exercise per Week: Not on file  . Minutes of Exercise per Session: Not on file  Stress:   . Feeling of Stress : Not on file  Social Connections:   . Frequency of Communication with Friends and Family: Not on file  . Frequency of Social Gatherings with Friends and Family: Not on file  . Attends Religious Services: Not on file  .  Active Member of Clubs or Organizations: Not on file  . Attends Archivist Meetings: Not on file  . Marital Status: Not on file  Intimate Partner Violence:   . Fear of Current or Ex-Partner: Not on file  . Emotionally Abused: Not on file  . Physically Abused: Not on file  . Sexually Abused: Not on file     Observations/Objective:  There were no vitals filed for this visit.  Appropriate responses on phone, no distress. Assessment and Plan: Acute gout of right foot, unspecified cause  -Recent flare, now improving with use of colchicine.  Diarrhea initially but not since.  Previous flare 6 to 12 months ago.  -Continue colchicine next few days, then if not improving into next week, follow-up visit discussed as may need other treatment including possible prednisone.  RTC precautions if acute worsening.  -Plan on uric acid at follow-up visit in January.  Handout given on gout as well as low purine diet -patient instructions  Follow Up Instructions:   January 15 as scheduled.   Patient Instructions  Gout  Gout is a condition that causes painful swelling of the joints. Gout is a type of inflammation of the joints (arthritis). This condition is caused by having too much uric acid in the body. Uric acid is a chemical that forms when the body breaks down substances called purines. Purines are important for building body proteins. When the body has too much uric acid, sharp crystals can form and build up inside the joints. This causes pain and swelling. Gout attacks can happen quickly and may be very painful (acute gout). Over time, the attacks can affect more joints and become more frequent (chronic gout). Gout can also cause uric acid to build up under the skin and inside the kidneys. What are the causes? This condition is caused by too much uric acid in your blood. This can happen because:  Your kidneys do not remove enough uric acid from your blood. This is the most common  cause.  Your body makes too much uric acid. This can happen with some cancers and cancer treatments. It can also occur  if your body is breaking down too many red blood cells (hemolytic anemia).  You eat too many foods that are high in purines. These foods include organ meats and some seafood. Alcohol, especially beer, is also high in purines. A gout attack may be triggered by trauma or stress. What increases the risk? You are more likely to develop this condition if you:  Have a family history of gout.  Are male and middle-aged.  Are male and have gone through menopause.  Are obese.  Frequently drink alcohol, especially beer.  Are dehydrated.  Lose weight too quickly.  Have an organ transplant.  Have lead poisoning.  Take certain medicines, including aspirin, cyclosporine, diuretics, levodopa, and niacin.  Have kidney disease.  Have a skin condition called psoriasis. What are the signs or symptoms? An attack of acute gout happens quickly. It usually occurs in just one joint. The most common place is the big toe. Attacks often start at night. Other joints that may be affected include joints of the feet, ankle, knee, fingers, wrist, or elbow. Symptoms of this condition may include:  Severe pain.  Warmth.  Swelling.  Stiffness.  Tenderness. The affected joint may be very painful to touch.  Shiny, red, or purple skin.  Chills and fever. Chronic gout may cause symptoms more frequently. More joints may be involved. You may also have white or yellow lumps (tophi) on your hands or feet or in other areas near your joints. How is this diagnosed? This condition is diagnosed based on your symptoms, medical history, and physical exam. You may have tests, such as:  Blood tests to measure uric acid levels.  Removal of joint fluid with a thin needle (aspiration) to look for uric acid crystals.  X-rays to look for joint damage. How is this treated? Treatment for this  condition has two phases: treating an acute attack and preventing future attacks. Acute gout treatment may include medicines to reduce pain and swelling, including:  NSAIDs.  Steroids. These are strong anti-inflammatory medicines that can be taken by mouth (orally) or injected into a joint.  Colchicine. This medicine relieves pain and swelling when it is taken soon after an attack. It can be given by mouth or through an IV. Preventive treatment may include:  Daily use of smaller doses of NSAIDs or colchicine.  Use of a medicine that reduces uric acid levels in your blood.  Changes to your diet. You may need to see a dietitian about what to eat and drink to prevent gout. Follow these instructions at home: During a gout attack   If directed, put ice on the affected area: ? Put ice in a plastic bag. ? Place a towel between your skin and the bag. ? Leave the ice on for 20 minutes, 2-3 times a day.  Raise (elevate) the affected joint above the level of your heart as often as possible.  Rest the joint as much as possible. If the affected joint is in your leg, you may be given crutches to use.  Follow instructions from your health care provider about eating or drinking restrictions. Avoiding future gout attacks  Follow a low-purine diet as told by your dietitian or health care provider. Avoid foods and drinks that are high in purines, including liver, kidney, anchovies, asparagus, herring, mushrooms, mussels, and beer.  Maintain a healthy weight or lose weight if you are overweight. If you want to lose weight, talk with your health care provider. It is important that you do not  lose weight too quickly.  Start or maintain an exercise program as told by your health care provider. Eating and drinking  Drink enough fluids to keep your urine pale yellow.  If you drink alcohol: ? Limit how much you use to:  0-1 drink a day for women.  0-2 drinks a day for men. ? Be aware of how much  alcohol is in your drink. In the [place] one drink equals one 12 oz bottle of beer (355 mL) one 5 oz glass of wine (148 mL), or one 1 oz glass of hard liquor (44 mL). General instructions  Take over-the-counter and prescription medicines only as told by your health care provider.  Do not drive or use heavy machinery while taking prescription pain medicine.  Return to your normal activities as told by your health care provider. Ask your health care provider what activities are safe for you.  Keep all follow-up visits as told by your health care provider. This is important. Contact a health care provider if you have:  Another gout attack.  Continuing symptoms of a gout attack after 10 days of treatment.  Side effects from your medicines.  Chills or a fever.  Burning pain when you urinate.  Pain in your lower back or belly. Get help right away if you:  Have severe or uncontrolled pain.  Cannot urinate. Summary  Gout is painful swelling of the joints caused by inflammation.  The most common site of pain is the big toe, but it can affect other joints in the body.  Medicines and dietary changes can help to prevent and treat gout attacks. This information is not intended to replace advice given to you by your health care provider. Make sure you discuss any questions you have with your health care provider. Document Released: 07/09/2000 Document Revised: 02/01/2018 Document Reviewed: 02/01/2018 Elsevier Patient Education  [place] A low-purine eating plan involves making food choices to limit your intake of purine. Purine is a kind of uric acid. Too much uric acid in your blood can cause certain conditions, such as gout and kidney stones. Eating a low-purine diet can help control these conditions. What are tips for following this plan? Reading food labels   Avoid foods with saturated or Trans fat.  Check the ingredient list of grains-based  foods, such as bread and cereal, to make sure that they contain whole grains.  Check the ingredient list of sauces or soups to make sure they do not contain meat or fish.  When choosing soft drinks, check the ingredient list to make sure they do not contain high-fructose corn syrup. Shopping  Buy plenty of fresh fruits and vegetables.  Avoid buying canned or fresh fish.  Buy dairy products labeled as low-fat or nonfat.  Avoid buying premade or processed foods. These foods are often high in fat, salt (sodium), and added sugar. Cooking  Use olive oil instead of butter when cooking. Oils like olive oil, canola oil, and sunflower oil contain healthy fats. Meal planning  Learn which foods do or do not affect you. If you find out that a food tends to cause your gout symptoms to flare up, avoid eating that food. You can enjoy foods that do not cause problems. If you have any questions about a food item, talk with your dietitian or health care provider.  Limit foods high in fat, especially saturated fat. Fat makes it harder for your body to get rid of uric acid.  Choose foods that are lower in fat and are lean sources of protein. General guidelines  Limit alcohol intake to no more than 1 drink a day for nonpregnant women and 2 drinks a day for men. One drink equals 12 oz of beer, 5 oz of wine, or 1 oz of hard liquor. Alcohol can affect the way your body gets rid of uric acid.  Drink plenty of water to keep your urine clear or pale yellow. Fluids can help remove uric acid from your body.  If directed by your health care provider, take a vitamin C supplement.  Work with your health care provider and dietitian to develop a plan to achieve or maintain a healthy weight. Losing weight can help reduce uric acid in your blood. What foods are recommended? The items listed may not be a complete list. Talk with your dietitian about what dietary choices are best for you. Foods low in purines Foods  low in purines do not need to be limited. These include:  All fruits.  All low-purine vegetables, pickles, and olives.  Breads, pasta, rice, cornbread, and popcorn. Cake and other baked goods.  All dairy foods.  Eggs, nuts, and nut butters.  Spices and condiments, such as salt, herbs, and vinegar.  Plant oils, butter, and margarine.  Water, sugar-free soft drinks, tea, coffee, and cocoa.  Vegetable-based soups, broths, sauces, and gravies. Foods moderate in purines Foods moderate in purines should be limited to the amounts listed.   cup of asparagus, cauliflower, spinach, mushrooms, or green peas, each day.  2/3 cup uncooked oatmeal, each day.   cup dry wheat bran or wheat germ, each day.  2-3 ounces of meat or poultry, each day.  4-6 ounces of shellfish, such as crab, lobster, oysters, or shrimp, each day.  1 cup cooked beans, peas, or lentils, each day.  Soup, broths, or bouillon made from meat or fish. Limit these foods as much as possible. What foods are not recommended? The items listed may not be a complete list. Talk with your dietitian about what dietary choices are best for you. Limit your intake of foods high in purines, including:  Beer and other alcohol.  Meat-based gravy or sauce.  Canned or fresh fish, such as: ? Anchovies, sardines, herring, and tuna. ? Mussels and scallops. ? Codfish, trout, and haddock.  Berniece Salines.  Organ meats, such as: ? Liver or kidney. ? Tripe. ? Sweetbreads (thymus gland or pancreas).  Wild Clinical biochemist.  Yeast or yeast extract supplements.  Drinks sweetened with high-fructose corn syrup. Summary  Eating a low-purine diet can help control conditions caused by too much uric acid in the body, such as gout or kidney stones.  Choose low-purine foods, limit alcohol, and limit foods high in fat.  You will learn over time which foods do or do not affect you. If you find out that a food tends to cause your gout symptoms  to flare up, avoid eating that food. This information is not intended to replace advice given to you by your health care provider. Make sure you discuss any questions you have with your health care provider. Document Released: 11/06/2010 Document Revised: 06/24/2017 Document Reviewed: 08/25/2016 Elsevier Patient Education  2020 [employer].    I discussed the assessment and treatment plan with the patient. The patient was provided an opportunity to ask questions and all were answered. The patient agreed with the plan and demonstrated an understanding of the instructions.   The patient was advised to  call back or seek an in-person evaluation if the symptoms worsen or if the condition fails to improve as anticipated.  I provided 8 minutes of non-face-to-face time during this encounter.  Signed,   [name], MD [facility].  07/12/19

## 2019-07-28 ENCOUNTER — Other Ambulatory Visit: Payer: Self-pay | Admitting: Family Medicine

## 2019-07-28 DIAGNOSIS — M109 Gout, unspecified: Secondary | ICD-10-CM

## 2019-07-31 ENCOUNTER — Other Ambulatory Visit: Payer: Medicare Other

## 2019-07-31 ENCOUNTER — Other Ambulatory Visit: Payer: Self-pay

## 2019-07-31 DIAGNOSIS — I10 Essential (primary) hypertension: Secondary | ICD-10-CM

## 2019-07-31 DIAGNOSIS — I2510 Atherosclerotic heart disease of native coronary artery without angina pectoris: Secondary | ICD-10-CM

## 2019-07-31 DIAGNOSIS — E785 Hyperlipidemia, unspecified: Secondary | ICD-10-CM

## 2019-07-31 LAB — CBC WITH DIFFERENTIAL/PLATELET
Basophils Absolute: 0 10*3/uL (ref 0.0–0.2)
Basos: 0 %
EOS (ABSOLUTE): 0.1 10*3/uL (ref 0.0–0.4)
Eos: 2 %
Hematocrit: 42.1 % (ref 37.5–51.0)
Hemoglobin: 14.4 g/dL (ref 13.0–17.7)
Immature Grans (Abs): 0 10*3/uL (ref 0.0–0.1)
Immature Granulocytes: 0 %
Lymphocytes Absolute: 2.9 10*3/uL (ref 0.7–3.1)
Lymphs: 57 %
MCH: 31.7 pg (ref 26.6–33.0)
MCHC: 34.2 g/dL (ref 31.5–35.7)
MCV: 93 fL (ref 79–97)
Monocytes Absolute: 0.4 10*3/uL (ref 0.1–0.9)
Monocytes: 8 %
Neutrophils Absolute: 1.7 10*3/uL (ref 1.4–7.0)
Neutrophils: 33 %
Platelets: 186 10*3/uL (ref 150–450)
RBC: 4.54 x10E6/uL (ref 4.14–5.80)
RDW: 13.8 % (ref 11.6–15.4)
WBC: 5.1 10*3/uL (ref 3.4–10.8)

## 2019-07-31 LAB — COMPREHENSIVE METABOLIC PANEL
ALT: 31 IU/L (ref 0–44)
AST: 31 IU/L (ref 0–40)
Albumin/Globulin Ratio: 2 (ref 1.2–2.2)
Albumin: 4.6 g/dL (ref 3.7–4.7)
Alkaline Phosphatase: 76 IU/L (ref 39–117)
BUN/Creatinine Ratio: 17 (ref 10–24)
BUN: 34 mg/dL — ABNORMAL HIGH (ref 8–27)
Bilirubin Total: 0.4 mg/dL (ref 0.0–1.2)
CO2: 26 mmol/L (ref 20–29)
Calcium: 9.1 mg/dL (ref 8.6–10.2)
Chloride: 98 mmol/L (ref 96–106)
Creatinine, Ser: 2.01 mg/dL — ABNORMAL HIGH (ref 0.76–1.27)
GFR calc Af Amer: 37 mL/min/{1.73_m2} — ABNORMAL LOW (ref >59)
GFR calc non Af Amer: 32 mL/min/{1.73_m2} — ABNORMAL LOW (ref >59)
Globulin, Total: 2.3 g/dL (ref 1.5–4.5)
Glucose: 207 mg/dL — ABNORMAL HIGH (ref 65–99)
Potassium: 4 mmol/L (ref 3.5–5.2)
Sodium: 139 mmol/L (ref 134–144)
Total Protein: 6.9 g/dL (ref 6.0–8.5)

## 2019-07-31 LAB — LIPID PANEL
Chol/HDL Ratio: 2.9 ratio (ref 0.0–5.0)
Cholesterol, Total: 138 mg/dL (ref 100–199)
HDL: 47 mg/dL (ref >39)
LDL Chol Calc (NIH): 69 mg/dL (ref 0–99)
Triglycerides: 126 mg/dL (ref 0–149)
VLDL Cholesterol Cal: 22 mg/dL (ref 5–40)

## 2019-07-31 LAB — TSH: TSH: 4.3 u[IU]/mL (ref 0.450–4.500)

## 2019-08-03 ENCOUNTER — Telehealth: Payer: Self-pay | Admitting: Cardiology

## 2019-08-03 DIAGNOSIS — I10 Essential (primary) hypertension: Secondary | ICD-10-CM

## 2019-08-03 NOTE — Telephone Encounter (Signed)
-----   Message from Nuala Alpha, LPN sent at [id number]  7:28 AM EST -----  ----- Message ----- From: [name], MD Sent: 07/31/2019  10:22 PM EST To: Nuala Alpha, LPN  Normal electrolytes, liver and thyroid function, normal CBC, excellent lipids, worsening kidney function, I would discontinue hydrochlorothiazide and Lasix and repeat his BMP in 2 weeks

## 2019-08-03 NOTE — Telephone Encounter (Signed)
Pt has been notified of lab results by phone with verbal understanding. Pt is agreeable to plan of care and will come in the office for BMET 08/17/19 per Dr. Meda Coffee states labs in 2 weeks. Pt is aware to stop HCTZ and Lasix until further advice. Pt thanked me for the call. Patient notified of result.  Please refer to phone note from today for complete details.   Julaine Hua, [place] 08/03/2019 1:55 PM

## 2019-08-03 NOTE — Telephone Encounter (Signed)
  Patient is returning call for lab results 

## 2019-08-10 ENCOUNTER — Ambulatory Visit (INDEPENDENT_AMBULATORY_CARE_PROVIDER_SITE_OTHER): Payer: Medicare Other | Admitting: Family Medicine

## 2019-08-10 ENCOUNTER — Other Ambulatory Visit: Payer: Self-pay

## 2019-08-10 ENCOUNTER — Encounter: Payer: Self-pay | Admitting: Family Medicine

## 2019-08-10 VITALS — BP 185/75 | HR 63 | Temp 97.9°F | Ht 63.0 in | Wt 203.0 lb

## 2019-08-10 DIAGNOSIS — N529 Male erectile dysfunction, unspecified: Secondary | ICD-10-CM

## 2019-08-10 DIAGNOSIS — R7989 Other specified abnormal findings of blood chemistry: Secondary | ICD-10-CM | POA: Diagnosis not present

## 2019-08-10 DIAGNOSIS — I10 Essential (primary) hypertension: Secondary | ICD-10-CM | POA: Diagnosis not present

## 2019-08-10 DIAGNOSIS — E1122 Type 2 diabetes mellitus with diabetic chronic kidney disease: Secondary | ICD-10-CM

## 2019-08-10 MED ORDER — HYDRALAZINE HCL 25 MG PO TABS: 25.0000 mg | ORAL_TABLET | Freq: Three times a day (TID) | ORAL | 1 refills | Status: DC

## 2019-08-10 NOTE — Progress Notes (Signed)
Subjective:  Patient ID: Antonio Wells, male    DOB: [birth year]  Age: 74 y.o. MRN: [id number]  CC:  Chief Complaint  Patient presents with  . Follow-up    hypertension and diabetes check. pt hasn't had any physical symptoms from his condictions. general helth pt feels good no complaints.  . Erectile Dysfunction    started shortly after starting metformin.     HPI Antonio Wells presents for   Diabetes with CKD.  Nephrology - Dr. [name] 2/15 Last GFR 37 on 1/5. Planned repeat bloodwork in a week.  Stopped lasix and hctz by cardiology 1 week ago.  Creat increased from 1.84 in July 2020 to 2.01 on 1/5. (trend since 09/2018: 1.50, 1.50, 1.52, 1.65, 1.84, 2.01).  Glipizide 5mg  qd, metformin 500mg  bid.  No new side effects - only occasional diarrhea. Once per week.   On irbesartan 300mg  qd, on lipitor 80mg  for statin.  Microalbumin: elevated ratio of 76 02/07/19 Optho, foot exam, pneumovax:  Due for foot exam today.   Lab Results  Component Value Date   HGBA1C 7.2 (H) 02/07/2019   HGBA1C 6.5 (H) 08/07/2018   HGBA1C 7.5 (A) 04/28/2018   Lab Results  Component Value Date   MICROALBUR 39.6 08/25/2015   LDLCALC 69 07/31/2019   CREATININE 1.54 (H) 08/10/2019   Hypertension:  Home readings: 150-160 since stopping lasic/hctz per cardiology - no additional meds. . On irbesartan 300mg  qd. Metoprolol 100mg  BID.  Hypertensive ermergency/encephalopathy last march. Had been treated with hydralazine then.  Longstanding issues with erections - difficulty for years.  Constitutional: Negative for fatigue and unexpected weight change.   Eyes: Negative for visual disturbance.  Respiratory: Negative for cough, chest tightness and shortness of breath.   Cardiovascular: Negative for chest pain, palpitations and leg swelling.  Gastrointestinal: Negative for abdominal pain and blood in stool.  Neurological: Negative for dizziness, light-headedness and headaches, no weakness.      BP  Readings from Last 3 Encounters:  08/10/19 (!) 185/75  02/07/19 140/80  11/28/18 (!) 188/80   Lab Results  Component Value Date   CREATININE 1.54 (H) 08/10/2019         History Patient Active Problem List   Diagnosis Date Noted  . Hypertensive encephalopathy 10/19/2018  . Chronic kidney disease, stage 3 10/19/2018  . DM (diabetes mellitus), type 2 with renal complications ([place]) Q000111Q  . Cough 03/17/2018  . Lower resp. tract infection 03/17/2018  . Sleep apnea with use of continuous positive airway pressure (CPAP) 01/04/2018  . Malnutrition of moderate degree 07/14/2017  . Thrush 07/11/2017  . FTT (failure to thrive) in adult 07/11/2017  . 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 CAD (coronary artery disease) 05/25/2017  . Unstable angina ([place])   . Hypertensive heart disease 07/14/2015  . Lower extremity edema 07/14/2015  . Aortic stenosis 01/09/2015  . OSA on CPAP 12/12/2014  . Hypersomnia with sleep apnea 12/12/2014  . Obstructive sleep apnea (adult) (pediatric) 02/04/2014  . 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 Obesity 02/04/2014  . Mitral regurgitation and aortic stenosis 01/02/2014  . DOE (dyspnea on exertion) 11/26/2013  . Gout 08/12/2011  . Hypertension 08/12/2011  . High cholesterol 08/12/2011  . OSA (obstructive sleep apnea) 08/12/2011  . Diabetes type 2, uncontrolled ([place]) 08/12/2011   Past Medical History:  Diagnosis Date  . Arthritis    "knees" (07/11/2017)  . CKD (chronic kidney disease), stage III   . Coronary artery disease    a.  CABGx3 in  05/2017.  Marland Kitchen Gout   . Heart murmur   . High cholesterol   . Hypertension   . Mild aortic stenosis   . Mild mitral regurgitation   . OSA on CPAP   . Postoperative atrial fibrillation ([place]) 06/2017  . Sleep apnea   . Type II diabetes mellitus ([place])    Past Surgical History:  Procedure Laterality Date  . CARDIAC CATHETERIZATION    . COLONOSCOPY  2008  . CORONARY ARTERY BYPASS GRAFT N/A 06/24/2017   Procedure:  CORONARY ARTERY BYPASS GRAFTING (CABG) x 3 ; Using Left Internal Mammary Artery and Right Great Saphenous Leg Vein Harvested Endoscopically;  Surgeon: Gaye Pollack, MD;  Location: [facility];  Service: Open Heart Surgery;  Laterality: N/A;  . EYE SURGERY    . LEFT HEART CATH AND CORONARY ANGIOGRAPHY N/A 05/25/2017   Procedure: LEFT HEART CATH AND CORONARY ANGIOGRAPHY;  Surgeon: Wellington Hampshire, MD;  Location: [facility];  Service: Cardiovascular;  Laterality: N/A;  . TEE WITHOUT CARDIOVERSION N/A 06/24/2017   Procedure: TRANSESOPHAGEAL ECHOCARDIOGRAM (TEE);  Surgeon: Gaye Pollack, MD;  Location: [facility];  Service: Open Heart Surgery;  Laterality: N/A;   Allergies  Allergen Reactions  . Penicillins Other (See Comments)    Has patient had a PCN reaction causing immediate rash, facial/tongue/throat swelling, SOB or lightheadedness with hypotension: No Has patient had a PCN reaction causing severe rash involving mucus membranes or skin necrosis: No Has patient had a PCN reaction that required hospitalization: No Has patient had a PCN reaction occurring within the last 10 years: No If all of the above answers are "NO", then may proceed with Cephalosporin use.   Passed out ? SYNCOPE ?  Marland Kitchen Lisinopril Cough  . Amiodarone Nausea And Vomiting  . Amlodipine Other (See Comments)    Causes dizzines  . Oxycodone Other (See Comments)    hallucinations  . Zofran [Ondansetron Hcl] Other (See Comments)    Has an interaction with another medication pt takes   . Cortisone Nausea And Vomiting and Other (See Comments)    HICCUPS  . Tramadol Nausea And Vomiting and Other (See Comments)    HICCUPS   Prior to Admission medications   Medication Sig Start Date End Date Taking? Authorizing Provider  acetaminophen (TYLENOL) 325 MG tablet Take 650 mg by mouth every 6 (six) hours as needed for mild pain.   Yes [provider]  aspirin EC 81 MG tablet Take 1 tablet (81 mg total) by mouth daily.  09/02/17  Yes Dunn, Dayna N, PA-C  atorvastatin (LIPITOR) 80 MG tablet TAKE 1 TABLET BY MOUTH  DAILY AT 6 PM. 02/07/19  Yes [name], MD  colchicine 0.6 MG tablet TAKE 2 TABS ON ONSET OF GOUT FLARE THEN REPEAT IN 1 HOUR OF ACUTE GOUT ATTACK 07/06/19  Yes [name], MD  glipiZIDE (GLUCOTROL) 5 MG tablet TAKE 1 TABLET BY MOUTH  DAILY BEFORE BREAKFAST 02/07/19  Yes [name], MD  irbesartan (AVAPRO) 300 MG tablet Take 300 mg by mouth daily.   Yes [provider]  metFORMIN (GLUCOPHAGE) 500 MG tablet TAKE 1 TABLET BY MOUTH TWO  TIMES DAILY WITH A MEAL 02/07/19  Yes [name], MD  metoprolol tartrate (LOPRESSOR) 100 MG tablet Take 1 tablet (100 mg total) by mouth 2 (two) times daily. 02/07/19  Yes [name], MD  furosemide (LASIX) 20 MG tablet Take 1 tablet (20 mg total) by mouth daily as needed for  fluid or edema. 02/07/19   [name], MD  hydrochlorothiazide (HYDRODIURIL) 12.5 MG tablet Take 1 tablet (12.5 mg total) by mouth daily. 02/07/19   [name], MD   Social History   Socioeconomic History  . Marital status: Married    Spouse name: [name]  . Number of children: 3  . Years of education: 23  . Highest education level: Not on file  Occupational History  . Occupation: Musician  Tobacco Use  . Smoking status: Former Smoker    Packs/day: 2.00    Years: 15.00    Pack years: 30.00    Types: Cigarettes    Quit date: 07/26/1998    Years since quitting: 21.0  . Smokeless tobacco: Never Used  Substance and Sexual Activity  . Alcohol use: No    Alcohol/week: 0.0 standard drinks  . Drug use: No  . Sexual activity: Yes  Other Topics Concern  . Not on file  Social History Narrative   Patient is married [name]) and lives at home with his wife.   Patient has three adult children.   Patient is retired.   Patient is a Research officer, political party.   Patient is right-handed.   Patient drinks one cup of coffee daily and 2-3 sodas per day.      Social  Determinants of Health   Financial Resource Strain:   . Difficulty of Paying Living Expenses: Not on file  Food Insecurity:   . Worried About Charity fundraiser in the Last Year: Not on file  . Ran Out of Food in the Last Year: Not on file  Transportation Needs:   . Lack of Transportation (Medical): Not on file  . Lack of Transportation (Non-Medical): Not on file  Physical Activity:   . Days of Exercise per Week: Not on file  . Minutes of Exercise per Session: Not on file  Stress:   . Feeling of Stress : Not on file  Social Connections:   . Frequency of Communication with Friends and Family: Not on file  . Frequency of Social Gatherings with Friends and Family: Not on file  . Attends Religious Services: Not on file  . Active Member of Clubs or Organizations: Not on file  . Attends Archivist Meetings: Not on file  . Marital Status: Not on file  Intimate Partner Violence:   . Fear of Current or Ex-Partner: Not on file  . Emotionally Abused: Not on file  . Physically Abused: Not on file  . Sexually Abused: Not on file    Review of Systems  Constitutional: Negative for fatigue and unexpected weight change.  Eyes: Negative for visual disturbance.  Respiratory: Negative for cough, chest tightness and shortness of breath.   Cardiovascular: Negative for chest pain, palpitations and leg swelling.  Gastrointestinal: Negative for abdominal pain and blood in stool.  Genitourinary: Negative for difficulty urinating.  Neurological: Negative for dizziness, light-headedness and headaches.     Objective:   Vitals:   08/10/19 1022  BP: (!) 185/75  Pulse: 63  Temp: 97.9 F (36.6 C)  TempSrc: Temporal  SpO2: 98%  Weight: 203 lb (92.1 kg)  Height: 5\' 3"  (1.6 m)     Physical Exam Vitals reviewed.  Constitutional:      Appearance: He is well-developed.  HENT:     Head: Normocephalic and atraumatic.  Eyes:     Pupils: Pupils are equal, round, and reactive to light.    Neck:     Vascular: No carotid  bruit or JVD.  Cardiovascular:     Rate and Rhythm: Normal rate and regular rhythm.     Heart sounds: Normal heart sounds. No murmur.  Pulmonary:     Effort: Pulmonary effort is normal.     Breath sounds: Normal breath sounds. No rales.  Musculoskeletal:     Right lower leg: No edema.     Left lower leg: No edema.  Skin:    General: Skin is warm and dry.  Neurological:     Mental Status: He is alert and oriented to person, place, and time.      Assessment & Plan:  Antonio Wells is a 74 y.o. male . Type 2 diabetes mellitus with chronic kidney disease, without long-term current use of insulin, unspecified CKD stage ([place]) - Plan: HM Diabetes Foot Exam, Basic metabolic panel Increase creatinine  -Chronic kidney disease with recent worsening renal function.  Lasix, hydrochlorothiazide recently discontinued by cardiology.  Check a BMP to evaluate creatinine for acute changes.  May need to hold Metformin as well.  Essential hypertension - Plan: hydrALAZINE (APRESOLINE) 25 MG tablet  -Elevated off HCTZ/Lasix.  History of hypertensive emergency/hypertensive encephalopathy.  Asymptomatic at present.  Add hydralazine initially 12.5 mg 3 times daily for 3 days, then 25 mg 3 times daily if tolerated.  -Repeat visit 5 days, TeleMed.  ER precautions given.  Erectile dysfunction  -Possibly multifactorial with hypertension, diabetes.  Deferred new medications for now given changes above.  Will discuss further at follow-up but initially improved blood pressure control needed.   Meds ordered this encounter  Medications  . hydrALAZINE (APRESOLINE) 25 MG tablet    Sig: Take 1 tablet (25 mg total) by mouth 3 (three) times daily. Ok to start at 1/2 pill tid for first 3 days, monitor home readings.    Dispense:  90 tablet    Refill:  1   Patient Instructions    Kidney function has increased.  Unfortunately blood pressure is also significantly elevated today.   Restart hydralazine 1/2 pill 3 times per day for the next few days, then if blood pressure still above 140/90 increase to 1 full pill 3 times per day.  Follow-up by telemedicine visit with home blood pressure readings in 5 days.  Return to the clinic or go to the nearest emergency room if any of your symptoms worsen or new symptoms occur.  Depending on your kidney function we may need to adjust Metformin as well.  Regarding erectile dysfunction, that could be related to blood pressure as well as diabetes.  I would like to see blood pressure stabilized and can discuss that further once we have some other readings.     Managing Your Hypertension Hypertension is commonly called high blood pressure. This is when the force of your blood pressing against the walls of your arteries is too strong. Arteries are blood vessels that carry blood from your heart throughout your body. Hypertension forces the heart to work harder to pump blood, and may cause the arteries to become narrow or stiff. Having untreated or uncontrolled hypertension can cause heart attack, stroke, kidney disease, and other problems. What are blood pressure readings? A blood pressure reading consists of a higher number over a lower number. Ideally, your blood pressure should be below 120/80. The first ("top") number is called the systolic pressure. It is a measure of the pressure in your arteries as your heart beats. The second ("bottom") number is called the diastolic pressure. It is a measure  of the pressure in your arteries as the heart relaxes. What does my blood pressure reading mean? Blood pressure is classified into four stages. Based on your blood pressure reading, your health care provider may use the following stages to determine what type of treatment you need, if any. Systolic pressure and diastolic pressure are measured in a unit called mm Hg. Normal  Systolic pressure: below 123456.  Diastolic pressure: below  80. Elevated  Systolic pressure: Q000111Q.  Diastolic pressure: below 80. Hypertension stage 1  Systolic pressure: 0000000.  Diastolic pressure: [id number]. Hypertension stage 2  Systolic pressure: [id number] or above.  Diastolic pressure: 90 or above. What health risks are associated with hypertension? Managing your hypertension is an important responsibility. Uncontrolled hypertension can lead to:  A heart attack.  A stroke.  A weakened blood vessel (aneurysm).  Heart failure.  Kidney damage.  Eye damage.  Metabolic syndrome.  Memory and concentration problems. What changes can I make to manage my hypertension? Hypertension can be managed by making lifestyle changes and possibly by taking medicines. Your health care provider will help you make a plan to bring your blood pressure within a normal range. Eating and drinking   Eat a diet that is high in fiber and potassium, and low in salt (sodium), added sugar, and fat. An example eating plan is called the DASH (Dietary Approaches to Stop Hypertension) diet. To eat this way: ? Eat plenty of fresh fruits and vegetables. Try to fill half of your plate at each meal with fruits and vegetables. ? Eat whole grains, such as whole wheat pasta, brown rice, or whole grain bread. Fill about one quarter of your plate with whole grains. ? Eat low-fat diary products. ? Avoid fatty cuts of meat, processed or cured meats, and poultry with skin. Fill about one quarter of your plate with lean proteins such as fish, chicken without skin, beans, eggs, and tofu. ? Avoid premade and processed foods. These tend to be higher in sodium, added sugar, and fat.  Reduce your daily sodium intake. Most people with hypertension should eat less than 1,500 mg of sodium a day.  Limit alcohol intake to no more than 1 drink a day for nonpregnant women and 2 drinks a day for men. One drink equals 12 oz of beer, 5 oz of wine, or 1 oz of hard liquor. Lifestyle  Work  with your health care provider to maintain a healthy body weight, or to lose weight. Ask what an ideal weight is for you.  Get at least 30 minutes of exercise that causes your heart to beat faster (aerobic exercise) most days of the week. Activities may include walking, swimming, or biking.  Include exercise to strengthen your muscles (resistance exercise), such as weight lifting, as part of your weekly exercise routine. Try to do these types of exercises for 30 minutes at least 3 days a week.  Do not use any products that contain nicotine or tobacco, such as cigarettes and e-cigarettes. If you need help quitting, ask your health care provider.  Control any long-term (chronic) conditions you have, such as high cholesterol or diabetes. Monitoring  Monitor your blood pressure at home as told by your health care provider. Your personal target blood pressure may vary depending on your medical conditions, your age, and other factors.  Have your blood pressure checked regularly, as often as told by your health care provider. Working with your health care provider  Review all the medicines you take with your health  care provider because there may be side effects or interactions.  Talk with your health care provider about your diet, exercise habits, and other lifestyle factors that may be contributing to hypertension.  Visit your health care provider regularly. Your health care provider can help you create and adjust your plan for managing hypertension. Will I need medicine to control my blood pressure? Your health care provider may prescribe medicine if lifestyle changes are not enough to get your blood pressure under control, and if:  Your systolic blood pressure is 130 or higher.  Your diastolic blood pressure is 80 or higher. Take medicines only as told by your health care provider. Follow the directions carefully. Blood pressure medicines must be taken as prescribed. The medicine does not  work as well when you skip doses. Skipping doses also puts you at risk for problems. Contact a health care provider if:  You think you are having a reaction to medicines you have taken.  You have repeated (recurrent) headaches.  You feel dizzy.  You have swelling in your ankles.  You have trouble with your vision. Get help right away if:  You develop a severe headache or confusion.  You have unusual weakness or numbness, or you feel faint.  You have severe pain in your chest or abdomen.  You vomit repeatedly.  You have trouble breathing. Summary  Hypertension is when the force of blood pumping through your arteries is too strong. If this condition is not controlled, it may put you at risk for serious complications.  Your personal target blood pressure may vary depending on your medical conditions, your age, and other factors. For most people, a normal blood pressure is less than 120/80.  Hypertension is managed by lifestyle changes, medicines, or both. Lifestyle changes include weight loss, eating a healthy, low-sodium diet, exercising more, and limiting alcohol. This information is not intended to replace advice given to you by your health care provider. Make sure you discuss any questions you have with your health care provider. Document Revised: 11/03/2018 Document Reviewed: 06/09/2016 Elsevier Patient Education  [place].  Erectile Dysfunction Erectile dysfunction (ED) is the inability to get or keep an erection in order to have sexual intercourse. Erectile dysfunction may include:  Inability to get an erection.  Lack of enough hardness of the erection to allow penetration.  Loss of the erection before sex is finished. What are the causes? This condition may be caused by:  Certain medicines, such as: ? Pain relievers. ? Antihistamines. ? Antidepressants. ? Blood pressure medicines. ? Water pills (diuretics). ? Ulcer medicines. ? Muscle  relaxants. ? Drugs.  Excessive drinking.  Psychological causes, such as: ? Anxiety. ? Depression. ? Sadness. ? Exhaustion. ? Performance fear. ? Stress.  Physical causes, such as: ? Artery problems. This may include diabetes, smoking, liver disease, or atherosclerosis. ? High blood pressure. ? Hormonal problems, such as low testosterone. ? Obesity. ? Nerve problems. This may include back or pelvic injuries, diabetes mellitus, multiple sclerosis, or Parkinson disease. What are the signs or symptoms? Symptoms of this condition include:  Inability to get an erection.  Lack of enough hardness of the erection to allow penetration.  Loss of the erection before sex is finished.  Normal erections at some times, but with frequent unsatisfactory episodes.  Low sexual satisfaction in either partner due to erection problems.  A curved penis occurring with erection. The curve may cause pain or the penis may be too curved to allow for intercourse.  Never having nighttime erections. How is this diagnosed? This condition is often diagnosed by:  Performing a physical exam to find other diseases or specific problems with the penis.  Asking you detailed questions about the problem.  Performing blood tests to check for diabetes mellitus or to measure hormone levels.  Performing other tests to check for underlying health conditions.  Performing an ultrasound exam to check for scarring.  Performing a test to check blood flow to the penis.  Doing a sleep study at home to measure nighttime erections. How is this treated? This condition may be treated by:  Medicine taken by mouth to help you achieve an erection (oral medicine).  Hormone replacement therapy to replace low testosterone levels.  Medicine that is injected into the penis. Your health care provider may instruct you how to give yourself these injections at home.  Vacuum pump. This is a pump with a ring on it. The pump and  ring are placed on the penis and used to create pressure that helps the penis become erect.  Penile implant surgery. In this procedure, you may receive: ? An inflatable implant. This consists of cylinders, a pump, and a reservoir. The cylinders can be inflated with a fluid that helps to create an erection, and they can be deflated after intercourse. ? A semi-rigid implant. This consists of two silicone rubber rods. The rods provide some rigidity. They are also flexible, so the penis can both curve downward in its normal position and become straight for sexual intercourse.  Blood vessel surgery, to improve blood flow to the penis. During this procedure, a blood vessel from a different part of the body is placed into the penis to allow blood to flow around (bypass) damaged or blocked blood vessels.  Lifestyle changes, such as exercising more, losing weight, and quitting smoking. Follow these instructions at home: Medicines   Take over-the-counter and prescription medicines only as told by your health care provider. Do not increase the dosage without first discussing it with your health care provider.  If you are using self-injections, perform injections as directed by your health care provider. Make sure to avoid any veins that are on the surface of the penis. After giving an injection, apply pressure to the injection site for 5 minutes. General instructions  Exercise regularly, as directed by your health care provider. Work with your health care provider to lose weight, if needed.  Do not use any products that contain nicotine or tobacco, such as cigarettes and e-cigarettes. If you need help quitting, ask your health care provider.  Before using a vacuum pump, read the instructions that come with the pump and discuss any questions with your health care provider.  Keep all follow-up visits as told by your health care provider. This is important. Contact a health care provider if:  You feel  nauseous.  You vomit. Get help right away if:  You are taking oral or injectable medicines and you have an erection that lasts longer than 4 hours. If your health care provider is unavailable, go to the nearest emergency room for evaluation. An erection that lasts much longer than 4 hours can result in permanent damage to your penis.  You have severe pain in your groin or abdomen.  You develop redness or severe swelling of your penis.  You have redness spreading up into your groin or lower abdomen.  You are unable to urinate.  You experience chest pain or a rapid heart beat (palpitations) after taking oral  medicines. Summary  Erectile dysfunction (ED) is the inability to get or keep an erection during sexual intercourse. This problem can usually be treated successfully.  This condition is diagnosed based on a physical exam, your symptoms, and tests to determine the cause. Treatment varies depending on the cause, and may include medicines, hormone therapy, surgery, or vacuum pump.  You may need follow-up visits to make sure that you are using your medicines or devices correctly.  Get help right away if you are taking or injecting medicines and you have an erection that lasts longer than 4 hours. This information is not intended to replace advice given to you by your health care provider. Make sure you discuss any questions you have with your health care provider. Document Revised: 06/24/2017 Document Reviewed: 07/28/2016 Elsevier Patient Education  [employer].     If you have lab work done today you will be contacted with your lab results within the next 2 weeks.  If you have not heard from [place] then please contact us. The fastest way to get your results is to register for My Chart.   IF you received an x-ray today, you will receive an invoice from [facility]. Please contact [facility] at [phone] with questions or concerns regarding your invoice.    IF you received labwork today, you will receive an invoice from [place]. Please contact [facility] at [phone] with questions or concerns regarding your invoice.   Our billing staff will not be able to assist you with questions regarding bills from these companies.  You will be contacted with the lab results as soon as they are available. The fastest way to get your results is to activate your My Chart account. Instructions are located on the last page of this paperwork. If you have not heard from [place] regarding the results in 2 weeks, please contact this office.         Signed, [name], MD [facility]

## 2019-08-10 NOTE — Patient Instructions (Addendum)
Kidney function has increased.  Unfortunately blood pressure is also significantly elevated today.  Restart hydralazine 1/2 pill 3 times per day for the next few days, then if blood pressure still above 140/90 increase to 1 full pill 3 times per day.  Follow-up by telemedicine visit with home blood pressure readings in 5 days.  Return to the clinic or go to the nearest emergency room if any of your symptoms worsen or new symptoms occur.  Depending on your kidney function we may need to adjust Metformin as well.  Regarding erectile dysfunction, that could be related to blood pressure as well as diabetes.  I would like to see blood pressure stabilized and can discuss that further once we have some other readings.     Managing Your Hypertension Hypertension is commonly called high blood pressure. This is when the force of your blood pressing against the walls of your arteries is too strong. Arteries are blood vessels that carry blood from your heart throughout your body. Hypertension forces the heart to work harder to pump blood, and may cause the arteries to become narrow or stiff. Having untreated or uncontrolled hypertension can cause heart attack, stroke, kidney disease, and other problems. What are blood pressure readings? A blood pressure reading consists of a higher number over a lower number. Ideally, your blood pressure should be below 120/80. The first ("top") number is called the systolic pressure. It is a measure of the pressure in your arteries as your heart beats. The second ("bottom") number is called the diastolic pressure. It is a measure of the pressure in your arteries as the heart relaxes. What does my blood pressure reading mean? Blood pressure is classified into four stages. Based on your blood pressure reading, your health care provider may use the following stages to determine what type of treatment you need, if any. Systolic pressure and diastolic pressure are measured in a unit  called mm Hg. Normal  Systolic pressure: below 123456.  Diastolic pressure: below 80. Elevated  Systolic pressure: [id number].  Diastolic pressure: below 80. Hypertension stage 1  Systolic pressure: 0000000.  Diastolic pressure: [id number]. Hypertension stage 2  Systolic pressure: [id number] or above.  Diastolic pressure: 90 or above. What health risks are associated with hypertension? Managing your hypertension is an important responsibility. Uncontrolled hypertension can lead to:  A heart attack.  A stroke.  A weakened blood vessel (aneurysm).  Heart failure.  Kidney damage.  Eye damage.  Metabolic syndrome.  Memory and concentration problems. What changes can I make to manage my hypertension? Hypertension can be managed by making lifestyle changes and possibly by taking medicines. Your health care provider will help you make a plan to bring your blood pressure within a normal range. Eating and drinking   Eat a diet that is high in fiber and potassium, and low in salt (sodium), added sugar, and fat. An example eating plan is called the DASH (Dietary Approaches to Stop Hypertension) diet. To eat this way: ? Eat plenty of fresh fruits and vegetables. Try to fill half of your plate at each meal with fruits and vegetables. ? Eat whole grains, such as whole wheat pasta, brown rice, or whole grain bread. Fill about one quarter of your plate with whole grains. ? Eat low-fat diary products. ? Avoid fatty cuts of meat, processed or cured meats, and poultry with skin. Fill about one quarter of your plate with lean proteins such as fish, chicken without skin, beans, eggs, and tofu. ? Avoid premade  and processed foods. These tend to be higher in sodium, added sugar, and fat.  Reduce your daily sodium intake. Most people with hypertension should eat less than 1,500 mg of sodium a day.  Limit alcohol intake to no more than 1 drink a day for nonpregnant women and 2 drinks a day for men. One  drink equals 12 oz of beer, 5 oz of wine, or 1 oz of hard liquor. Lifestyle  Work with your health care provider to maintain a healthy body weight, or to lose weight. Ask what an ideal weight is for you.  Get at least 30 minutes of exercise that causes your heart to beat faster (aerobic exercise) most days of the week. Activities may include walking, swimming, or biking.  Include exercise to strengthen your muscles (resistance exercise), such as weight lifting, as part of your weekly exercise routine. Try to do these types of exercises for 30 minutes at least 3 days a week.  Do not use any products that contain nicotine or tobacco, such as cigarettes and e-cigarettes. If you need help quitting, ask your health care provider.  Control any long-term (chronic) conditions you have, such as high cholesterol or diabetes. Monitoring  Monitor your blood pressure at home as told by your health care provider. Your personal target blood pressure may vary depending on your medical conditions, your age, and other factors.  Have your blood pressure checked regularly, as often as told by your health care provider. Working with your health care provider  Review all the medicines you take with your health care provider because there may be side effects or interactions.  Talk with your health care provider about your diet, exercise habits, and other lifestyle factors that may be contributing to hypertension.  Visit your health care provider regularly. Your health care provider can help you create and adjust your plan for managing hypertension. Will I need medicine to control my blood pressure? Your health care provider may prescribe medicine if lifestyle changes are not enough to get your blood pressure under control, and if:  Your systolic blood pressure is 130 or higher.  Your diastolic blood pressure is 80 or higher. Take medicines only as told by your health care provider. Follow the directions  carefully. Blood pressure medicines must be taken as prescribed. The medicine does not work as well when you skip doses. Skipping doses also puts you at risk for problems. Contact a health care provider if:  You think you are having a reaction to medicines you have taken.  You have repeated (recurrent) headaches.  You feel dizzy.  You have swelling in your ankles.  You have trouble with your vision. Get help right away if:  You develop a severe headache or confusion.  You have unusual weakness or numbness, or you feel faint.  You have severe pain in your chest or abdomen.  You vomit repeatedly.  You have trouble breathing. Summary  Hypertension is when the force of blood pumping through your arteries is too strong. If this condition is not controlled, it may put you at risk for serious complications.  Your personal target blood pressure may vary depending on your medical conditions, your age, and other factors. For most people, a normal blood pressure is less than 120/80.  Hypertension is managed by lifestyle changes, medicines, or both. Lifestyle changes include weight loss, eating a healthy, low-sodium diet, exercising more, and limiting alcohol. This information is not intended to replace advice given to you by your health  care provider. Make sure you discuss any questions you have with your health care provider. Document Revised: 11/03/2018 Document Reviewed: 06/09/2016 Elsevier Patient Education  [employer].  Erectile Dysfunction Erectile dysfunction (ED) is the inability to get or keep an erection in order to have sexual intercourse. Erectile dysfunction may include:  Inability to get an erection.  Lack of enough hardness of the erection to allow penetration.  Loss of the erection before sex is finished. What are the causes? This condition may be caused by:  Certain medicines, such as: ? Pain relievers. ? Antihistamines. ? Antidepressants. ? Blood  pressure medicines. ? Water pills (diuretics). ? Ulcer medicines. ? Muscle relaxants. ? Drugs.  Excessive drinking.  Psychological causes, such as: ? Anxiety. ? Depression. ? Sadness. ? Exhaustion. ? Performance fear. ? Stress.  Physical causes, such as: ? Artery problems. This may include diabetes, smoking, liver disease, or atherosclerosis. ? High blood pressure. ? Hormonal problems, such as low testosterone. ? Obesity. ? Nerve problems. This may include back or pelvic injuries, diabetes mellitus, multiple sclerosis, or Parkinson disease. What are the signs or symptoms? Symptoms of this condition include:  Inability to get an erection.  Lack of enough hardness of the erection to allow penetration.  Loss of the erection before sex is finished.  Normal erections at some times, but with frequent unsatisfactory episodes.  Low sexual satisfaction in either partner due to erection problems.  A curved penis occurring with erection. The curve may cause pain or the penis may be too curved to allow for intercourse.  Never having nighttime erections. How is this diagnosed? This condition is often diagnosed by:  Performing a physical exam to find other diseases or specific problems with the penis.  Asking you detailed questions about the problem.  Performing blood tests to check for diabetes mellitus or to measure hormone levels.  Performing other tests to check for underlying health conditions.  Performing an ultrasound exam to check for scarring.  Performing a test to check blood flow to the penis.  Doing a sleep study at home to measure nighttime erections. How is this treated? This condition may be treated by:  Medicine taken by mouth to help you achieve an erection (oral medicine).  Hormone replacement therapy to replace low testosterone levels.  Medicine that is injected into the penis. Your health care provider may instruct you how to give yourself these  injections at home.  Vacuum pump. This is a pump with a ring on it. The pump and ring are placed on the penis and used to create pressure that helps the penis become erect.  Penile implant surgery. In this procedure, you may receive: ? An inflatable implant. This consists of cylinders, a pump, and a reservoir. The cylinders can be inflated with a fluid that helps to create an erection, and they can be deflated after intercourse. ? A semi-rigid implant. This consists of two silicone rubber rods. The rods provide some rigidity. They are also flexible, so the penis can both curve downward in its normal position and become straight for sexual intercourse.  Blood vessel surgery, to improve blood flow to the penis. During this procedure, a blood vessel from a different part of the body is placed into the penis to allow blood to flow around (bypass) damaged or blocked blood vessels.  Lifestyle changes, such as exercising more, losing weight, and quitting smoking. Follow these instructions at home: Medicines   Take over-the-counter and prescription medicines only as told by  your health care provider. Do not increase the dosage without first discussing it with your health care provider.  If you are using self-injections, perform injections as directed by your health care provider. Make sure to avoid any veins that are on the surface of the penis. After giving an injection, apply pressure to the injection site for 5 minutes. General instructions  Exercise regularly, as directed by your health care provider. Work with your health care provider to lose weight, if needed.  Do not use any products that contain nicotine or tobacco, such as cigarettes and e-cigarettes. If you need help quitting, ask your health care provider.  Before using a vacuum pump, read the instructions that come with the pump and discuss any questions with your health care provider.  Keep all follow-up visits as told by your health  care provider. This is important. Contact a health care provider if:  You feel nauseous.  You vomit. Get help right away if:  You are taking oral or injectable medicines and you have an erection that lasts longer than 4 hours. If your health care provider is unavailable, go to the nearest emergency room for evaluation. An erection that lasts much longer than 4 hours can result in permanent damage to your penis.  You have severe pain in your groin or abdomen.  You develop redness or severe swelling of your penis.  You have redness spreading up into your groin or lower abdomen.  You are unable to urinate.  You experience chest pain or a rapid heart beat (palpitations) after taking oral medicines. Summary  Erectile dysfunction (ED) is the inability to get or keep an erection during sexual intercourse. This problem can usually be treated successfully.  This condition is diagnosed based on a physical exam, your symptoms, and tests to determine the cause. Treatment varies depending on the cause, and may include medicines, hormone therapy, surgery, or vacuum pump.  You may need follow-up visits to make sure that you are using your medicines or devices correctly.  Get help right away if you are taking or injecting medicines and you have an erection that lasts longer than 4 hours. This information is not intended to replace advice given to you by your health care provider. Make sure you discuss any questions you have with your health care provider. Document Revised: 06/24/2017 Document Reviewed: 07/28/2016 Elsevier Patient Education  [employer].     If you have lab work done today you will be contacted with your lab results within the next 2 weeks.  If you have not heard from [place] then please contact us. The fastest way to get your results is to register for My Chart.   IF you received an x-ray today, you will receive an invoice from [facility]. Please contact [facility]  [facility] at [phone] with questions or concerns regarding your invoice.   IF you received labwork today, you will receive an invoice from [place]. Please contact [facility] at [phone] with questions or concerns regarding your invoice.   Our billing staff will not be able to assist you with questions regarding bills from these companies.  You will be contacted with the lab results as soon as they are available. The fastest way to get your results is to activate your My Chart account. Instructions are located on the last page of this paperwork. If you have not heard from [place] regarding the results in 2 weeks, please contact this office.

## 2019-08-11 ENCOUNTER — Encounter: Payer: Self-pay | Admitting: Family Medicine

## 2019-08-11 LAB — BASIC METABOLIC PANEL
BUN/Creatinine Ratio: 9 — ABNORMAL LOW (ref 10–24)
BUN: 14 mg/dL (ref 8–27)
CO2: 24 mmol/L (ref 20–29)
Calcium: 9.2 mg/dL (ref 8.6–10.2)
Chloride: 107 mmol/L — ABNORMAL HIGH (ref 96–106)
Creatinine, Ser: 1.54 mg/dL — ABNORMAL HIGH (ref 0.76–1.27)
GFR calc Af Amer: 51 mL/min/{1.73_m2} — ABNORMAL LOW (ref >59)
GFR calc non Af Amer: 44 mL/min/{1.73_m2} — ABNORMAL LOW (ref >59)
Glucose: 131 mg/dL — ABNORMAL HIGH (ref 65–99)
Potassium: 5 mmol/L (ref 3.5–5.2)
Sodium: 143 mmol/L (ref 134–144)

## 2019-08-15 ENCOUNTER — Telehealth (INDEPENDENT_AMBULATORY_CARE_PROVIDER_SITE_OTHER): Payer: Medicare Other | Admitting: Family Medicine

## 2019-08-15 ENCOUNTER — Other Ambulatory Visit: Payer: Self-pay

## 2019-08-15 VITALS — BP 178/81 | HR 70 | Temp 97.8°F | Ht 63.0 in | Wt 201.0 lb

## 2019-08-15 DIAGNOSIS — I10 Essential (primary) hypertension: Secondary | ICD-10-CM

## 2019-08-15 DIAGNOSIS — R7989 Other specified abnormal findings of blood chemistry: Secondary | ICD-10-CM | POA: Diagnosis not present

## 2019-08-15 DIAGNOSIS — E1122 Type 2 diabetes mellitus with diabetic chronic kidney disease: Secondary | ICD-10-CM

## 2019-08-15 NOTE — Progress Notes (Signed)
Virtual Visit via Video Note  I connected with Antonio Wells on 08/15/19 at 2:43 PM by a video enabled telemedicine application and verified that I am speaking with the correct person using two identifiers.   I discussed the limitations, risks, security and privacy concerns of performing an evaluation and management service by telephone and the availability of in person appointments. I also discussed with the patient that there may be a patient responsible charge related to this service. The patient expressed understanding and agreed to proceed, consent obtained  Chief complaint:  HTN  History of Present Illness: Antonio Wells is a 74 y.o. male  Follow up from 1/15.   Hypertension: Last discussed on 1/15, had stopped lasix and hctz 1 week prior by cardiology due to increasing creatinine.  Home readings increasing. Continued irbesartan and metoprolol.  Added hydralazine 25mg  1/2 tid, then full 25mg  tid since yesterday.  Home readings in 180-185/85 range on 1/2 pill  BP this am: 178/80.  Constitutional: Negative for fatigue and unexpected weight change.  Eyes: Negative for visual disturbance.  Respiratory: Negative for cough, chest tightness and shortness of breath.   Cardiovascular: Negative for chest pain, palpitations and leg swelling.  Gastrointestinal: Negative for abdominal pain and blood in stool.  Neurological: Negative for dizziness, light-headedness and headaches., focal weakness.  Feels well.   Creat 2.01 on 07/31/19, down to 1.54 on 1/15.  Has appt with Dr. Meda Coffee in 6 days, 2 days for blood work at cardiology.   Home readings: BP Readings from Last 3 Encounters:  08/15/19 (!) 178/81  08/10/19 (!) 185/75  02/07/19 140/80   Lab Results  Component Value Date   CREATININE 1.54 (H) 08/10/2019      Patient Active Problem List   Diagnosis Date Noted  . Hypertensive encephalopathy 10/19/2018  . Chronic kidney disease, stage 3 10/19/2018  . DM (diabetes  mellitus), type 2 with renal complications ([place]) [id number]  . Cough 03/17/2018  . Lower resp. tract infection 03/17/2018  . Sleep apnea with use of continuous positive airway pressure (CPAP) 01/04/2018  . Malnutrition of moderate degree 07/14/2017  . Thrush 07/11/2017  . FTT (failure to thrive) in adult 07/11/2017  . 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 CAD (coronary artery disease) 05/25/2017  . Unstable angina ([place])   . Hypertensive heart disease 07/14/2015  . Lower extremity edema 07/14/2015  . Aortic stenosis 01/09/2015  . OSA on CPAP 12/12/2014  . Hypersomnia with sleep apnea 12/12/2014  . Obstructive sleep apnea (adult) (pediatric) 02/04/2014  . 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 Obesity 02/04/2014  . Mitral regurgitation and aortic stenosis 01/02/2014  . DOE (dyspnea on exertion) 11/26/2013  . Gout 08/12/2011  . Hypertension 08/12/2011  . High cholesterol 08/12/2011  . OSA (obstructive sleep apnea) 08/12/2011  . Diabetes type 2, uncontrolled ([place]) 08/12/2011   Past Medical History:  Diagnosis Date  . Arthritis    "knees" (07/11/2017)  . CKD (chronic kidney disease), stage III   . Coronary artery disease    a.  CABGx3 in 05/2017.  Marland Kitchen Gout   . Heart murmur   . High cholesterol   . Hypertension   . Mild aortic stenosis   . Mild mitral regurgitation   . OSA on CPAP   . Postoperative atrial fibrillation ([place]) 06/2017  . Sleep apnea   . Type II diabetes mellitus ([place])    Past Surgical History:  Procedure Laterality Date  . CARDIAC CATHETERIZATION    . COLONOSCOPY  2008  .  CORONARY ARTERY BYPASS GRAFT N/A 06/24/2017   Procedure: CORONARY ARTERY BYPASS GRAFTING (CABG) x 3 ; Using Left Internal Mammary Artery and Right Great Saphenous Leg Vein Harvested Endoscopically;  Surgeon: Gaye Pollack, MD;  Location: [facility];  Service: Open Heart Surgery;  Laterality: N/A;  . EYE SURGERY    . LEFT HEART CATH AND CORONARY ANGIOGRAPHY N/A 05/25/2017   Procedure: LEFT HEART CATH AND  CORONARY ANGIOGRAPHY;  Surgeon: Wellington Hampshire, MD;  Location: [facility];  Service: Cardiovascular;  Laterality: N/A;  . TEE WITHOUT CARDIOVERSION N/A 06/24/2017   Procedure: TRANSESOPHAGEAL ECHOCARDIOGRAM (TEE);  Surgeon: Gaye Pollack, MD;  Location: [facility];  Service: Open Heart Surgery;  Laterality: N/A;   Allergies  Allergen Reactions  . Penicillins Other (See Comments)    Has patient had a PCN reaction causing immediate rash, facial/tongue/throat swelling, SOB or lightheadedness with hypotension: No Has patient had a PCN reaction causing severe rash involving mucus membranes or skin necrosis: No Has patient had a PCN reaction that required hospitalization: No Has patient had a PCN reaction occurring within the last 10 years: No If all of the above answers are "NO", then may proceed with Cephalosporin use.   Passed out ? SYNCOPE ?  Marland Kitchen Lisinopril Cough  . Amiodarone Nausea And Vomiting  . Amlodipine Other (See Comments)    Causes dizzines  . Oxycodone Other (See Comments)    hallucinations  . Zofran [Ondansetron Hcl] Other (See Comments)    Has an interaction with another medication pt takes   . Cortisone Nausea And Vomiting and Other (See Comments)    HICCUPS  . Tramadol Nausea And Vomiting and Other (See Comments)    HICCUPS   Prior to Admission medications   Medication Sig Start Date End Date Taking? Authorizing Provider  acetaminophen (TYLENOL) 325 MG tablet Take 650 mg by mouth every 6 (six) hours as needed for mild pain.   Yes [provider]  aspirin EC 81 MG tablet Take 1 tablet (81 mg total) by mouth daily. 09/02/17  Yes Dunn, Dayna N, PA-C  atorvastatin (LIPITOR) 80 MG tablet TAKE 1 TABLET BY MOUTH  DAILY AT 6 PM. 02/07/19  Yes [name], MD  colchicine 0.6 MG tablet TAKE 2 TABS ON ONSET OF GOUT FLARE THEN REPEAT IN 1 HOUR OF ACUTE GOUT ATTACK 07/06/19  Yes [name], MD  glipiZIDE (GLUCOTROL) 5 MG tablet TAKE 1 TABLET BY MOUTH  DAILY  BEFORE BREAKFAST 02/07/19  Yes [name], MD  hydrALAZINE (APRESOLINE) 25 MG tablet Take 1 tablet (25 mg total) by mouth 3 (three) times daily. Ok to start at 1/2 pill tid for first 3 days, monitor home readings. 08/10/19  Yes [name], MD  irbesartan (AVAPRO) 300 MG tablet Take 300 mg by mouth daily.   Yes [provider]  metFORMIN (GLUCOPHAGE) 500 MG tablet TAKE 1 TABLET BY MOUTH TWO  TIMES DAILY WITH A MEAL 02/07/19  Yes [name], MD  metoprolol tartrate (LOPRESSOR) 100 MG tablet Take 1 tablet (100 mg total) by mouth 2 (two) times daily. 02/07/19  Yes [name], MD   Social History   Socioeconomic History  . Marital status: Married    Spouse name: [name]  . Number of children: 3  . Years of education: 9  . Highest education level: Not on file  Occupational History  . Occupation: Musician  Tobacco Use  . Smoking status: Former Smoker  Packs/day: 2.00    Years: 15.00    Pack years: 30.00    Types: Cigarettes    Quit date: 07/26/1998    Years since quitting: 21.0  . Smokeless tobacco: Never Used  Substance and Sexual Activity  . Alcohol use: No    Alcohol/week: 0.0 standard drinks  . Drug use: No  . Sexual activity: Yes  Other Topics Concern  . Not on file  Social History Narrative   Patient is married [name]) and lives at home with his wife.   Patient has three adult children.   Patient is retired.   Patient is a Research officer, political party.   Patient is right-handed.   Patient drinks one cup of coffee daily and 2-3 sodas per day.      Social Determinants of Health   Financial Resource Strain:   . Difficulty of Paying Living Expenses: Not on file  Food Insecurity:   . Worried About Charity fundraiser in the Last Year: Not on file  . Ran Out of Food in the Last Year: Not on file  Transportation Needs:   . Lack of Transportation (Medical): Not on file  . Lack of Transportation (Non-Medical): Not on file  Physical Activity:   . Days of  Exercise per Week: Not on file  . Minutes of Exercise per Session: Not on file  Stress:   . Feeling of Stress : Not on file  Social Connections:   . Frequency of Communication with Friends and Family: Not on file  . Frequency of Social Gatherings with Friends and Family: Not on file  . Attends Religious Services: Not on file  . Active Member of Clubs or Organizations: Not on file  . Attends Archivist Meetings: Not on file  . Marital Status: Not on file  Intimate Partner Violence:   . Fear of Current or Ex-Partner: Not on file  . Emotionally Abused: Not on file  . Physically Abused: Not on file  . Sexually Abused: Not on file    Observations/Objective: Vitals:   08/15/19 1349  BP: (!) 178/81  Pulse: 70  Temp: 97.8 F (36.6 C)  TempSrc: Temporal  Weight: 201 lb (91.2 kg)  Height: 5\' 3"  (1.6 m)  home readings   Assessment and Plan: Elevated serum creatinine  Type 2 diabetes mellitus with chronic kidney disease, without long-term current use of insulin, unspecified CKD stage (HCC)  Essential hypertension  Creatinine improving on recent labs, continue to hold Lasix, HCTZ.  On higher dose of hydralazine at 25 mg 3 times daily since yesterday only.  Still some elevated readings.  Option of 4 times daily dosing if systolic remains above 0000000 in the next day or 2.  Orthostatic, hypotensive precautions discussed as med doses increase.  Labs planned in 2 days, cardiology follow-up in 6 days.  Nephrology next month - continue those appointments.  RTC/ER precautions given.  Follow Up Instructions: With cardiology next week as planned.   I discussed the assessment and treatment plan with the patient. The patient was provided an opportunity to ask questions and all were answered. The patient agreed with the plan and demonstrated an understanding of the instructions.   The patient was advised to call back or seek an in-person evaluation if the symptoms worsen or if the  condition fails to improve as anticipated.  I provided 10 minutes of non-face-to-face time during this encounter.   [name], MD

## 2019-08-16 ENCOUNTER — Telehealth: Payer: Self-pay

## 2019-08-16 NOTE — Telephone Encounter (Signed)
Attempted to contact pt to obtain consent for visit as well as verify medications, allergies, and pharmacy.

## 2019-08-17 ENCOUNTER — Other Ambulatory Visit: Payer: Medicare Other | Admitting: *Deleted

## 2019-08-17 ENCOUNTER — Other Ambulatory Visit: Payer: Self-pay

## 2019-08-17 DIAGNOSIS — I10 Essential (primary) hypertension: Secondary | ICD-10-CM

## 2019-08-17 LAB — BASIC METABOLIC PANEL
BUN/Creatinine Ratio: 9 — ABNORMAL LOW (ref 10–24)
BUN: 14 mg/dL (ref 8–27)
CO2: 22 mmol/L (ref 20–29)
Calcium: 9.4 mg/dL (ref 8.6–10.2)
Chloride: 110 mmol/L — ABNORMAL HIGH (ref 96–106)
Creatinine, Ser: 1.51 mg/dL — ABNORMAL HIGH (ref 0.76–1.27)
GFR calc Af Amer: 53 mL/min/{1.73_m2} — ABNORMAL LOW (ref >59)
GFR calc non Af Amer: 45 mL/min/{1.73_m2} — ABNORMAL LOW (ref >59)
Glucose: 138 mg/dL — ABNORMAL HIGH (ref 65–99)
Potassium: 4.7 mmol/L (ref 3.5–5.2)
Sodium: 147 mmol/L — ABNORMAL HIGH (ref 134–144)

## 2019-08-21 ENCOUNTER — Telehealth: Payer: Self-pay | Admitting: *Deleted

## 2019-08-21 ENCOUNTER — Encounter: Payer: Self-pay | Admitting: *Deleted

## 2019-08-21 ENCOUNTER — Telehealth (INDEPENDENT_AMBULATORY_CARE_PROVIDER_SITE_OTHER): Payer: Medicare Other | Admitting: Cardiology

## 2019-08-21 ENCOUNTER — Encounter: Payer: Self-pay | Admitting: Cardiology

## 2019-08-21 ENCOUNTER — Other Ambulatory Visit: Payer: Self-pay

## 2019-08-21 VITALS — BP 236/97 | HR 66 | Wt 202.0 lb

## 2019-08-21 DIAGNOSIS — I480 Paroxysmal atrial fibrillation: Secondary | ICD-10-CM

## 2019-08-21 DIAGNOSIS — E785 Hyperlipidemia, unspecified: Secondary | ICD-10-CM | POA: Diagnosis not present

## 2019-08-21 DIAGNOSIS — I10 Essential (primary) hypertension: Secondary | ICD-10-CM

## 2019-08-21 DIAGNOSIS — Z951 Presence of aortocoronary bypass graft: Secondary | ICD-10-CM

## 2019-08-21 DIAGNOSIS — I2510 Atherosclerotic heart disease of native coronary artery without angina pectoris: Secondary | ICD-10-CM

## 2019-08-21 MED ORDER — CARVEDILOL 25 MG PO TABS: 25.0000 mg | ORAL_TABLET | Freq: Two times a day (BID) | ORAL | 2 refills | Status: DC

## 2019-08-21 MED ORDER — HYDRALAZINE HCL 50 MG PO TABS: 50.0000 mg | ORAL_TABLET | Freq: Three times a day (TID) | ORAL | 2 refills | Status: DC

## 2019-08-21 MED ORDER — ISOSORBIDE MONONITRATE ER 30 MG PO TB24: 30.0000 mg | ORAL_TABLET | Freq: Every day | ORAL | 2 refills | Status: DC

## 2019-08-21 NOTE — Telephone Encounter (Signed)
Virtual Visit Pre-Appointment Phone Call  "(Name), I am calling you today to discuss your upcoming appointment. We are currently trying to limit exposure to the virus that causes COVID-19 by seeing patients at home rather than in the office."  1. "What is the BEST phone number to call the day of the visit?" - include this in appointment notes-YES   2. "Do you have or have access to (through a family member/friend) a smartphone with video capability that we can use for your visit?" a. If yes - list this number in appt notes as "cell" (if different from BEST phone #) and list the appointment type as a VIDEO visit in appointment notes -YES UPDATED   3. Confirm consent - "In the setting of the current Covid19 crisis, you are scheduled for a (phone or video) visit with your provider on (date) at (time).  Just as we do with many in-office visits, in order for you to participate in this visit, we must obtain consent.  If you'd like, I can send this to your mychart (if signed up) or email for you to review.  Otherwise, I can obtain your verbal consent now.  All virtual visits are billed to your insurance company just like a normal visit would be.  By agreeing to a virtual visit, we'd like you to understand that the technology does not allow for your provider to perform an examination, and thus may limit your provider's ability to fully assess your condition. If your provider identifies any concerns that need to be evaluated in person, we will make arrangements to do so.  Finally, though the technology is pretty good, we cannot assure that it will always work on either your or our end, and in the setting of a video visit, we may have to convert it to a phone-only visit.  In either situation, we cannot ensure that we have a secure connection.  Are you willing to proceed?" STAFF: Did the patient verbally acknowledge consent to telehealth visit? Document YES/NO here: PT GAVE VERBAL CONSENT FOR DR. Meda Coffee TO  TREAT HIM VIA VIDEO VISIT ON 08/21/19 AT 0900  4. Advise patient to be prepared - "Two hours prior to your appointment, go ahead and check your blood pressure, pulse, oxygen saturation, and your weight (if you have the equipment to check those) and write them all down. When your visit starts, your provider will ask you for this information. If you have an Apple Watch or Kardia device, please plan to have heart rate information ready on the day of your appointment. Please have a pen and paper handy nearby the day of the visit as well."   5. Inform patient they will receive a phone call 15 minutes prior to their appointment time (may be from unknown caller ID) so they should be prepared to answer    TELEPHONE CALL NOTE  Antonio Wells has been deemed a candidate for a follow-up tele-health visit to limit community exposure during the Covid-19 pandemic. I spoke with the patient via phone to ensure availability of phone/video source, confirm preferred email & phone number, and discuss instructions and expectations.  I reminded Antonio Wells to be prepared with any vital sign and/or heart rhythm information that could potentially be obtained via home monitoring, at the time of his visit. I reminded Antonio Wells to expect a phone call prior to his visit.  Nuala Alpha, LPN [id number] AM    IF USING DOXIMITY or DOXY.ME -  The patient will receive a link just prior to their visit by text.-YES     FULL LENGTH CONSENT FOR TELE-HEALTH VISIT   I hereby voluntarily request, consent and authorize [facility] and its employed or contracted physicians, physician assistants, nurse practitioners or other licensed health care professionals (the Practitioner), to provide me with telemedicine health care services (the "Services") as deemed necessary by the treating Practitioner. I acknowledge and consent to receive the Services by the Practitioner via telemedicine. I understand that the  telemedicine visit will involve communicating with the Practitioner through live audiovisual communication technology and the disclosure of certain medical information by electronic transmission. I acknowledge that I have been given the opportunity to request an in-person assessment or other available alternative prior to the telemedicine visit and am voluntarily participating in the telemedicine visit.  I understand that I have the right to withhold or withdraw my consent to the use of telemedicine in the course of my care at any time, without affecting my right to future care or treatment, and that the Practitioner or I may terminate the telemedicine visit at any time. I understand that I have the right to inspect all information obtained and/or recorded in the course of the telemedicine visit and may receive copies of available information for a reasonable fee.  I understand that some of the potential risks of receiving the Services via telemedicine include:  Marland Kitchen Delay or interruption in medical evaluation due to technological equipment failure or disruption; . Information transmitted may not be sufficient (e.g. poor resolution of images) to allow for appropriate medical decision making by the Practitioner; and/or  . In rare instances, security protocols could fail, causing a breach of personal health information.  Furthermore, I acknowledge that it is my responsibility to provide information about my medical history, conditions and care that is complete and accurate to the best of my ability. I acknowledge that Practitioner's advice, recommendations, and/or decision may be based on factors not within their control, such as incomplete or inaccurate data provided by me or distortions of diagnostic images or specimens that may result from electronic transmissions. I understand that the practice of medicine is not an exact science and that Practitioner makes no warranties or guarantees regarding treatment  outcomes. I acknowledge that I will receive a copy of this consent concurrently upon execution via email to the email address I last provided but may also request a printed copy by calling the office of [facility].    I understand that my insurance will be billed for this visit.   I have read or had this consent read to me. . I understand the contents of this consent, which adequately explains the benefits and risks of the Services being provided via telemedicine.  . I have been provided ample opportunity to ask questions regarding this consent and the Services and have had my questions answered to my satisfaction. . I give my informed consent for the services to be provided through the use of telemedicine in my medical care  6. By participating in this telemedicine visit I agree to the above.  PT GAVE VERBAL CONSENT FOR DR. Meda Coffee TO TREAT HIM VIA VIDEO VISIT ON 08/21/19 AT 0900

## 2019-08-21 NOTE — Patient Instructions (Signed)
Medication Instructions:   STOP TAKING METOPROLOL NOW  START TAKING CARVEDILOL (COREG) 25 MG BY MOUTH TWICE DAILY  START TAKING ISOSORBIDE MONONITRATE (IMDUR) 30 MG BY MOUTH DAILY  INCREASE YOUR HYDRALAZINE TO 50 MG BY MOUTH THREE TIMES DAILY  *If you need a refill on your cardiac medications before your next appointment, please call your pharmacy*    Follow-Up:  IN 2-3 MONTHS WITH DR. Halliday ON October 30, 2019 AT 8:40 AM--THIS WILL BE IN THE OFFICE AND WE WILL DO AN EKG AT THAT VISIT  Other Instructions  --DR. NELSON WOULD LIKE FOR YOU TO START TAKING YOUR MEDICATION CHANGES AND MONITOR YOUR BLOOD PRESSURES AND HEART RATE DAILY.  SHE WOULD LIKE FOR YOU TO CHECK THIS DAILY FOR ONE WEEK, WRITE THIS DOWN, THEN [place] HER YOUR RESULTS.  YOU CAN MYCHART HER THE NUMBERS OR CALL THEM INTO THE OFFICE TO REPORT TO THE OPERATOR AT [phone]

## 2019-08-21 NOTE — Progress Notes (Signed)
Virtual Visit via Video Note   This visit type was conducted due to national recommendations for restrictions regarding the COVID-19 Pandemic (e.g. social distancing) in an effort to limit this patient's exposure and mitigate transmission in our community.  Due to his co-morbid illnesses, this patient is at least at moderate risk for complications without adequate follow up.  This format is felt to be most appropriate for this patient at this time.  All issues noted in this document were discussed and addressed.  A limited physical exam was performed with this format.  Please refer to the patient's chart for his consent to telehealth for [facility].   Date:  08/21/2019   ID:  Antonio Wells, DOB May 30, 1946, MRN [id number]  Patient Location: Home Provider Location: Home  PCP:  [name], MD  Cardiologist: Ena Dawley, MD  Evaluation Performed:  Follow-Up Visit  Reason for visit: 1 year follow up  History of Present Illness:    Antonio Wells is a 74 y.o. male with h/o CAD with unstable angina s/p recent CABGx3 in 05/2017 with post-op atrial fib, HTN, HLD, DM, mild AS/MR, CKD stage III, sleep apnea who presents for post-op follow-up. He was evaluated for unstable angina in October 2018 with abnormal cardiac CT. Cardiac cath showed obstructive 2V disease. Pre-op echo showed mild LVH, EF 60-65%, grade 1 DD, mild AS/MR. He returned for outpatient CABG and underwent CABGx3 with LIMA-LAD and sequential SVG to OM1/OM3. He did have post-op atrial fib treated with amiodarone. At post-op follow-up with primary care he reported persistent nausea so amiodarone was stopped after discussion with our office. He was unfortunately subsequently admitted 12/17-12/20 with persistent failure to thrive, weight loss, thrush and dehydration. He had evidence of AKI and was treated with IV fluids for hypernatremia. 30-day event monitor was recommended given d/c of amiodarone which was negative for  recurrent atrial fib. F/u labs by primary care 07/30/17 showed Hgb 12.4, K 4.7, Cr 1.38 (back near baseline). Otherwise recent LFTs were normal and LDL was 81 in 04/2017.  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  He has no chest pain shortness of breath he has mild intermittent lower extremity edema when he stands a lot and mild tenderness around sternotomy scar otherwise no other complaints no palpitations fever chills no cough and he tolerates all of his medications well.  08/20/2018 this is 1 year follow-up, patient has been doing well last year, he denies any chest pain feels slightly more short of breath but he has been struggling with high blood pressure in the 1 70-1 80 range and this morning to 36 after he forgot to take the last dose of hydralazine last night.  He is motivated to start exercising again he got himself a stationary bicycle.  He denies any dizziness, headaches presyncope or syncope.  He is being followed by nephrology as well.  The patient does not have symptoms concerning for COVID-19 infection (fever, chills, cough, or new shortness of breath).    Past Medical History:  Diagnosis Date  . Arthritis    "knees" (07/11/2017)  . CKD (chronic kidney disease), stage III   . Coronary artery disease    a.  CABGx3 in 05/2017.  Marland Kitchen Gout   . Heart murmur   . High cholesterol   . Hypertension   . Mild aortic stenosis   .  Mild mitral regurgitation   . OSA on CPAP   . Postoperative atrial fibrillation ([place]) 06/2017  . Sleep apnea   . Type II diabetes mellitus ([place])    Past Surgical History:  Procedure Laterality Date  . CARDIAC CATHETERIZATION    . COLONOSCOPY  2008  . CORONARY ARTERY BYPASS GRAFT N/A 06/24/2017   Procedure: CORONARY ARTERY BYPASS GRAFTING (CABG) x 3 ; Using Left Internal Mammary Artery and Right Great Saphenous Leg Vein Harvested  Endoscopically;  Surgeon: Gaye Pollack, MD;  Location: [facility];  Service: Open Heart Surgery;  Laterality: N/A;  . EYE SURGERY    . LEFT HEART CATH AND CORONARY ANGIOGRAPHY N/A 05/25/2017   Procedure: LEFT HEART CATH AND CORONARY ANGIOGRAPHY;  Surgeon: Wellington Hampshire, MD;  Location: [facility];  Service: Cardiovascular;  Laterality: N/A;  . TEE WITHOUT CARDIOVERSION N/A 06/24/2017   Procedure: TRANSESOPHAGEAL ECHOCARDIOGRAM (TEE);  Surgeon: Gaye Pollack, MD;  Location: [facility];  Service: Open Heart Surgery;  Laterality: N/A;     No outpatient medications have been marked as taking for the 08/21/19 encounter (Telemedicine) with [name], MD.     Allergies:   Penicillins, Lisinopril, Amiodarone, Amlodipine, Oxycodone, Zofran [ondansetron hcl], Cortisone, and Tramadol   Social History   Tobacco Use  . Smoking status: Former Smoker    Packs/day: 2.00    Years: 15.00    Pack years: 30.00    Types: Cigarettes    Quit date: 07/26/1998    Years since quitting: 21.0  . Smokeless tobacco: Never Used  Substance Use Topics  . Alcohol use: No    Alcohol/week: 0.0 standard drinks  . Drug use: No     Family Hx: The patient's family history includes Cancer in his brother; Colon cancer in his brother; Diabetes in his mother; Hypertension in his father and mother; Kidney disease in his mother; Stroke in his brother. There is no history of Esophageal cancer, Rectal cancer, or Stomach cancer.  ROS:   Please see the history of present illness.    All other systems reviewed and are negative.   Prior CV studies:   The following studies were reviewed today:  Labs/Other Tests and Data Reviewed:    EKG:  No ECG reviewed.  Recent Labs: 07/31/2019: ALT 31; Hemoglobin 14.4; Platelets 186; TSH 4.300 08/17/2019: BUN 14; Creatinine, Ser 1.51; Potassium 4.7; Sodium 147   Recent Lipid Panel Lab Results  Component Value Date/Time   CHOL 138 07/31/2019 07:36 AM   TRIG 126 07/31/2019  07:36 AM   HDL 47 07/31/2019 07:36 AM   CHOLHDL 2.9 07/31/2019 07:36 AM   CHOLHDL 2.8 02/24/2016 07:49 AM   LDLCALC 69 07/31/2019 07:36 AM    Wt Readings from Last 3 Encounters:  08/21/19 202 lb (91.6 kg)  08/15/19 201 lb (91.2 kg)  08/10/19 203 lb (92.1 kg)     Objective:    Vital Signs:  BP (!) 236/97   Pulse 66   Wt 202 lb (91.6 kg)   BMI 35.78 kg/m    VITAL SIGNS:  reviewed  ASSESSMENT & PLAN:    1. Hypertension -patient's blood pressure is severely elevated, first he is advised on necessity of low-sodium diet, I will switch metoprolol to carvedilol 25 mg p.o. twice daily.  Increase hydralazine to 50 mg p.o. 3 times daily and add Imdur 30 mg daily.  He is advised to send [place] a blood pressure diary starting few days from now. 2. CAD - doing  well post-operatively.  He has minimally worsening dyspnea on exertion however this is possibly secondary to deconditioning and uncontrolled blood pressure. 3. Paroxysmal atrial fib -no recurrent episodes.  On aspirin only. 4. Acute on chronic diastolic CHF, appears euvolemic  5. Hyperlipidemia -lipids are at goal, Lipitor is well-tolerated. 6.   Mild aortic stenosis and mitral regurgitation -asymptomatic and stable on echo in December 2019, we will follow with an echo in 3 years unless his murmur or symptoms change.  Follow-up in person in 2 months.  COVID-19 Education: The signs and symptoms of COVID-19 were discussed with the patient and how to seek care for testing (follow up with PCP or arrange E-visit).  The importance of social distancing was discussed today.  Time:   Today, I have spent 25 minutes with the patient with telehealth technology discussing the above problems.     Medication Adjustments/Labs and Tests Ordered: Current medicines are reviewed at length with the patient today.  Concerns regarding medicines are outlined above.   Tests Ordered: No orders of the defined types were placed in this  encounter.   Medication Changes: No orders of the defined types were placed in this encounter.   Follow Up:  In Person in 2 month(s)  Signed, Ena Dawley, MD  08/21/2019 9:43 AM    [place]

## 2019-08-22 ENCOUNTER — Telehealth: Payer: Self-pay | Admitting: Cardiology

## 2019-08-22 ENCOUNTER — Telehealth: Payer: Self-pay | Admitting: *Deleted

## 2019-08-22 MED ORDER — HYDRALAZINE HCL 50 MG PO TABS: 50.0000 mg | ORAL_TABLET | Freq: Two times a day (BID) | ORAL | 1 refills | Status: DC

## 2019-08-22 NOTE — Telephone Encounter (Signed)
It is common that he will feel dizzy when his blood pressure drops so significantly, I would reduce hydralazine to 50 mg but to be taken only twice daily.  Please keep [place] posted on your blood pressure measurements so we can keep adjusting your medication.

## 2019-08-22 NOTE — Telephone Encounter (Signed)
Pt is calling stating he took his increased hydralazine 50 mg tid yesterday, as advised at yesterday's televisit. Pt states that when he woke up this morning his BP was 192/88 and then he took his first dose of increased hydralazine 50 mg, waited an hour, then took his BP again and it was 124/66.  Pt states that when it went to 124/66, he got super dizzy.  He voiced no other complaints.  Pts BP has been running high for sometime, and this is the lowest pressure he's had. Pt is asking if this is normal to feel dizzy with his pressure dropping like that, or should Dr. Meda Coffee change his dose of hydralazine to a reduced amount.  Advised the pt that his BP is running within normal limits, and he should push po fluids when taking these doses. Informed the pt that I will run this by Dr. Meda Coffee to further advise as needed, and follow-up with him shortly thereafter.

## 2019-08-22 NOTE — Telephone Encounter (Signed)
New Message:    Please call, pt said he talked to you yesterday. He says  He needs to talk to you again please.

## 2019-08-22 NOTE — Telephone Encounter (Signed)
Called the pt and informed him that per Dr. Meda Coffee, it is common that he feel dizzy when his BP drops significantly.  Informed the pt that Dr. Meda Coffee recommends that he decrease his hydralazine to 50 mg po bid, continue to record his readings, and send those to [place] through his mychart in a couple of days.  Pt verbalized understanding and agrees with this plan.

## 2019-08-22 NOTE — Telephone Encounter (Signed)
Schedule AWV.  

## 2019-08-27 ENCOUNTER — Ambulatory Visit (INDEPENDENT_AMBULATORY_CARE_PROVIDER_SITE_OTHER): Payer: Medicare Other | Admitting: Family Medicine

## 2019-08-27 VITALS — BP 108/62 | Ht 63.0 in | Wt 202.0 lb

## 2019-08-27 DIAGNOSIS — Z0000 Encounter for general adult medical examination without abnormal findings: Secondary | ICD-10-CM

## 2019-08-27 NOTE — Patient Instructions (Addendum)
Thank you for taking time to come for your Medicare Wellness Visit. I appreciate your ongoing commitment to your health goals. Please review the following plan we discussed and let me know if I can assist you in the future.  Leroy Kennedy LPN  Preventive Care 64 Years and Older, Male Preventive care refers to lifestyle choices and visits with your health care provider that can promote health and wellness. This includes:  A yearly physical exam. This is also called an annual well check.  Regular dental and eye exams.  Immunizations.  Screening for certain conditions.  Healthy lifestyle choices, such as diet and exercise. What can I expect for my preventive care visit? Physical exam Your health care provider will check:  Height and weight. These may be used to calculate body mass index (BMI), which is a measurement that tells if you are at a healthy weight.  Heart rate and blood pressure.  Your skin for abnormal spots. Counseling Your health care provider may ask you questions about:  Alcohol, tobacco, and drug use.  Emotional well-being.  Home and relationship well-being.  Sexual activity.  Eating habits.  History of falls.  Memory and ability to understand (cognition).  Work and work Statistician. What immunizations do I need?  Influenza (flu) vaccine  This is recommended every year. Tetanus, diphtheria, and pertussis (Tdap) vaccine  You may need a Td booster every 10 years. Varicella (chickenpox) vaccine  You may need this vaccine if you have not already been vaccinated. Zoster (shingles) vaccine  You may need this after age 66. Pneumococcal conjugate (PCV13) vaccine  One dose is recommended after age 84. Pneumococcal polysaccharide (PPSV23) vaccine  One dose is recommended after age 88. Measles, mumps, and rubella (MMR) vaccine  You may need at least one dose of MMR if you were born in 1957 or later. You may also need a second dose. Meningococcal  conjugate (MenACWY) vaccine  You may need this if you have certain conditions. Hepatitis A vaccine  You may need this if you have certain conditions or if you travel or work in places where you may be exposed to hepatitis A. Hepatitis B vaccine  You may need this if you have certain conditions or if you travel or work in places where you may be exposed to hepatitis B. Haemophilus influenzae type b (Hib) vaccine  You may need this if you have certain conditions. You may receive vaccines as individual doses or as more than one vaccine together in one shot (combination vaccines). Talk with your health care provider about the risks and benefits of combination vaccines. What tests do I need? Blood tests  Lipid and cholesterol levels. These may be checked every 5 years, or more frequently depending on your overall health.  Hepatitis C test.  Hepatitis B test. Screening  Lung cancer screening. You may have this screening every year starting at age 24 if you have a 30-pack-year history of smoking and currently smoke or have quit within the past 15 years.  Colorectal cancer screening. All adults should have this screening starting at age 52 and continuing until age 61. Your health care provider may recommend screening at age 57 if you are at increased risk. You will have tests every 1-10 years, depending on your results and the type of screening test.  Prostate cancer screening. Recommendations will vary depending on your family history and other risks.  Diabetes screening. This is done by checking your blood sugar (glucose) after you have not eaten for  a while (fasting). You may have this done every 1-3 years.  Abdominal aortic aneurysm (AAA) screening. You may need this if you are a current or former smoker.  Sexually transmitted disease (STD) testing. Follow these instructions at home: Eating and drinking  Eat a diet that includes fresh fruits and vegetables, whole grains, lean  protein, and low-fat dairy products. Limit your intake of foods with high amounts of sugar, saturated fats, and salt.  Take vitamin and mineral supplements as recommended by your health care provider.  Do not drink alcohol if your health care provider tells you not to drink.  If you drink alcohol: ? Limit how much you have to 0-2 drinks a day. ? Be aware of how much alcohol is in your drink. In the [place] one drink equals one 12 oz bottle of beer (355 mL), one 5 oz glass of wine (148 mL), or one 1 oz glass of hard liquor (44 mL). Lifestyle  Take daily care of your teeth and gums.  Stay active. Exercise for at least 30 minutes on 5 or more days each week.  Do not use any products that contain nicotine or tobacco, such as cigarettes, e-cigarettes, and chewing tobacco. If you need help quitting, ask your health care provider.  If you are sexually active, practice safe sex. Use a condom or other form of protection to prevent STIs (sexually transmitted infections).  Talk with your health care provider about taking a low-dose aspirin or statin. What's next?  Visit your health care provider once a year for a well check visit.  Ask your health care provider how often you should have your eyes and teeth checked.  Stay up to date on all vaccines. This information is not intended to replace advice given to you by your health care provider. Make sure you discuss any questions you have with your health care provider. Document Revised: 07/06/2018 Document Reviewed: 07/06/2018 Elsevier Patient Education  2020 [employer]  

## 2019-08-27 NOTE — Progress Notes (Signed)
Presents today for [employer] Visit   Date of last exam:08/15/2019  Interpreter used for this visit?  No  I connected with  Antonio Wells on 08/27/19 by a telephone  and verified that I am speaking with the correct person using two identifiers.   I discussed the limitations of evaluation and management by telemedicine. The patient expressed understanding and agreed to proceed.   Patient Care Team: Antonio Agreste, MD as PCP - General (Family Medicine) Antonio Spark, MD as Consulting Physician (Cardiology) [place], Antonio Gibson, RN as [place] Management   Other items to address today:   Discussed Eye/Dental Discussed Immunizations   Other Screening: Last screening for diabetes: 02/07/2019 Last lipid screening: 07/31/2019  ADVANCE DIRECTIVES: Discussed:yes On File: no Materials Provided: yes  Immunization status:  Immunization History  Administered Date(s) Administered  . Influenza, High Dose Seasonal PF 03/20/2019  . Influenza, Seasonal, Injecte, Preservative Fre 07/03/2012  . Influenza,inj,Quad PF,6+ Mos 08/25/2015, 05/12/2017, 05/29/2018  . Influenza-Unspecified 07/24/2016  . Pneumococcal Polysaccharide-23 07/03/2012  . Tdap 07/03/2012     Health Maintenance Due  Topic Date Due  . HEMOGLOBIN A1C  08/10/2019     Functional Status Survey: Is the patient deaf or have difficulty hearing?: No Does the patient have difficulty seeing, even when wearing glasses/contacts?: No Does the patient have difficulty concentrating, remembering, or making decisions?: No Does the patient have difficulty walking or climbing stairs?: Yes(athritis knees) Does the patient have difficulty dressing or bathing?: No Does the patient have difficulty doing errands alone such as visiting a doctor's office or shopping?: No   6CIT Screen 08/27/2019 03/29/2017  What Year? 0 points 0 points  What month? 0 points 0 points  What time? 0 points 0  points  Count back from 20 0 points 0 points  Months in reverse 0 points 0 points  Repeat phrase 0 points 0 points  Total Score 0 0        Clinical Support from 08/27/2019 in [facility] at [facility]  AUDIT-C Score  0       Home Environment:   Lives in a two story home Patient is married Antonio Wells) and lives at home with his wife. Patient has three adult children. Patient is retired. Patient is a Research officer, political party.   Some trouble climbing stairs (due to arthritis) No scattered rugs No grab bars Adequate lighting/no clutter  Timed war up N/A    Patient Active Problem List   Diagnosis Date Noted  . Hypertensive encephalopathy 10/19/2018  . Chronic kidney disease, stage 3 10/19/2018  . DM (diabetes mellitus), type 2 with renal complications ([place]) [id number]  . Cough 03/17/2018  . Lower resp. tract infection 03/17/2018  . Sleep apnea with use of continuous positive airway pressure (CPAP) 01/04/2018  . Malnutrition of moderate degree 07/14/2017  . Thrush 07/11/2017  . FTT (failure to thrive) in adult 07/11/2017  . 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 CAD (coronary artery disease) 05/25/2017  . Unstable angina ([place])   . Hypertensive heart disease 07/14/2015  . Lower extremity edema 07/14/2015  . Aortic stenosis 01/09/2015  . OSA on CPAP 12/12/2014  . Hypersomnia with sleep apnea 12/12/2014  . Obstructive sleep apnea (adult) (pediatric) 02/04/2014  . 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 Obesity 02/04/2014  . Mitral regurgitation and aortic stenosis 01/02/2014  . DOE (dyspnea on exertion) 11/26/2013  . Gout 08/12/2011  . Hypertension 08/12/2011  . High cholesterol 08/12/2011  . OSA (obstructive  sleep apnea) 08/12/2011  . Diabetes type 2, uncontrolled ([place]) 08/12/2011     Past Medical History:  Diagnosis Date  . Arthritis    "knees" (07/11/2017)  . CKD (chronic kidney disease), stage III   . Coronary artery disease    a.  CABGx3 in 05/2017.  Marland Kitchen Gout   . Heart murmur   . High  cholesterol   . Hypertension   . Mild aortic stenosis   . Mild mitral regurgitation   . OSA on CPAP   . Postoperative atrial fibrillation ([place]) 06/2017  . Sleep apnea   . Type II diabetes mellitus ([place])      Past Surgical History:  Procedure Laterality Date  . CARDIAC CATHETERIZATION    . COLONOSCOPY  2008  . CORONARY ARTERY BYPASS GRAFT N/A 06/24/2017   Procedure: CORONARY ARTERY BYPASS GRAFTING (CABG) x 3 ; Using Left Internal Mammary Artery and Right Great Saphenous Leg Vein Harvested Endoscopically;  Surgeon: Antonio Pollack, MD;  Location: [facility];  Service: Open Heart Surgery;  Laterality: N/A;  . EYE SURGERY    . LEFT HEART CATH AND CORONARY ANGIOGRAPHY N/A 05/25/2017   Procedure: LEFT HEART CATH AND CORONARY ANGIOGRAPHY;  Surgeon: Antonio Hampshire, MD;  Location: [facility];  Service: Cardiovascular;  Laterality: N/A;  . TEE WITHOUT CARDIOVERSION N/A 06/24/2017   Procedure: TRANSESOPHAGEAL ECHOCARDIOGRAM (TEE);  Surgeon: Antonio Pollack, MD;  Location: [facility];  Service: Open Heart Surgery;  Laterality: N/A;     Family History  Problem Relation Age of Onset  . Hypertension Mother   . Kidney disease Mother   . Diabetes Mother   . Hypertension Father   . Cancer Brother        lung  . Colon cancer Brother   . Stroke Brother   . Esophageal cancer Neg Hx   . Rectal cancer Neg Hx   . Stomach cancer Neg Hx      Social History   Socioeconomic History  . Marital status: Married    Spouse name: Antonio Wells  . Number of children: 3  . Years of education: 47  . Highest education level: Not on file  Occupational History  . Occupation: Musician  Tobacco Use  . Smoking status: Former Smoker    Packs/day: 2.00    Years: 15.00    Pack years: 30.00    Types: Cigarettes    Quit date: 07/26/1998    Years since quitting: 21.1  . Smokeless tobacco: Never Used  Substance and Sexual Activity  . Alcohol use: No    Alcohol/week: 0.0 standard drinks  . Drug use: No  . Sexual  activity: Yes  Other Topics Concern  . Not on file  Social History Narrative         Social Determinants of Health   Financial Resource Strain:   . Difficulty of Paying Living Expenses: Not on file  Food Insecurity:   . Worried About Charity fundraiser in the Last Year: Not on file  . Ran Out of Food in the Last Year: Not on file  Transportation Needs:   . Lack of Transportation (Medical): Not on file  . Lack of Transportation (Non-Medical): Not on file  Physical Activity:   . Days of Exercise per Week: Not on file  . Minutes of Exercise per Session: Not on file  Stress:   . Feeling of Stress : Not on file  Social Connections:   . Frequency of Communication with Friends and Family:  Not on file  . Frequency of Social Gatherings with Friends and Family: Not on file  . Attends Religious Services: Not on file  . Active Member of Clubs or Organizations: Not on file  . Attends Archivist Meetings: Not on file  . Marital Status: Not on file  Intimate Partner Violence:   . Fear of Current or Ex-Partner: Not on file  . Emotionally Abused: Not on file  . Physically Abused: Not on file  . Sexually Abused: Not on file     Allergies  Allergen Reactions  . Penicillins Other (See Comments)    Has patient had a PCN reaction causing immediate rash, facial/tongue/throat swelling, SOB or lightheadedness with hypotension: No Has patient had a PCN reaction causing severe rash involving mucus membranes or skin necrosis: No Has patient had a PCN reaction that required hospitalization: No Has patient had a PCN reaction occurring within the last 10 years: No If all of the above answers are "NO", then may proceed with Cephalosporin use.   Passed out ? SYNCOPE ?  Marland Kitchen Lisinopril Cough  . Amiodarone Nausea And Vomiting  . Amlodipine Other (See Comments)    Causes dizzines  . Oxycodone Other (See Comments)    hallucinations  . Zofran [Ondansetron Hcl] Other (See Comments)    Has an  interaction with another medication pt takes   . Cortisone Nausea And Vomiting and Other (See Comments)    HICCUPS  . Tramadol Nausea And Vomiting and Other (See Comments)    HICCUPS     Prior to Admission medications   Medication Sig Start Date End Date Taking? Authorizing Provider  acetaminophen (TYLENOL) 325 MG tablet Take 650 mg by mouth every 6 (six) hours as needed for mild pain.   Yes [provider]  aspirin EC 81 MG tablet Take 1 tablet (81 mg total) by mouth daily. 09/02/17  Yes Dunn, Dayna N, PA-C  atorvastatin (LIPITOR) 80 MG tablet TAKE 1 TABLET BY MOUTH  DAILY AT 6 PM. 02/07/19  Yes Antonio Agreste, MD  carvedilol (COREG) 25 MG tablet Take 1 tablet (25 mg total) by mouth 2 (two) times daily. 08/21/19  Yes Antonio Spark, MD  colchicine 0.6 MG tablet TAKE 2 TABS ON ONSET OF GOUT FLARE THEN REPEAT IN 1 HOUR OF ACUTE GOUT ATTACK 07/06/19  Yes Antonio Agreste, MD  glipiZIDE (GLUCOTROL) 5 MG tablet TAKE 1 TABLET BY MOUTH  DAILY BEFORE BREAKFAST 02/07/19  Yes Antonio Agreste, MD  hydrALAZINE (APRESOLINE) 50 MG tablet Take 1 tablet (50 mg total) by mouth 2 (two) times daily. 08/22/19  Yes Antonio Spark, MD  irbesartan (AVAPRO) 300 MG tablet Take 300 mg by mouth daily.   Yes [provider]  isosorbide mononitrate (IMDUR) 30 MG 24 hr tablet Take 1 tablet (30 mg total) by mouth daily. 08/21/19  Yes Antonio Spark, MD  metFORMIN (GLUCOPHAGE) 500 MG tablet TAKE 1 TABLET BY MOUTH TWO  TIMES DAILY WITH A MEAL 02/07/19  Yes Antonio Agreste, MD     Depression screen [facility] 2/9 08/27/2019 08/10/2019 02/07/2019 12/05/2018 11/28/2018  Decreased Interest 0 0 0 0 0  Down, Depressed, Hopeless 0 0 0 0 0  PHQ - 2 Score 0 0 0 0 0  Some recent data might be hidden     Fall Risk  08/27/2019 08/10/2019 02/07/2019 12/05/2018 11/28/2018  Falls in the past year? 0 0 0 0 0  Number falls in past yr: 0 0 0  0 0  Injury with Fall? 0 0 0 0 0  Follow up Falls evaluation completed;Education  provided - Falls evaluation completed - Falls evaluation completed      PHYSICAL EXAM: BP 108/62 Comment: per patient  taken at home  Ht 5\' 3"  (1.6 m)   Wt 202 lb (91.6 kg)   BMI 35.78 kg/m    Wt Readings from Last 3 Encounters:  08/27/19 202 lb (91.6 kg)  08/21/19 202 lb (91.6 kg)  08/15/19 201 lb (91.2 kg)   Medicare annual wellness visit, subsequent    Education/Counseling provided regarding diet and exercise, prevention of chronic diseases, smoking/tobacco cessation, if applicable, and reviewed "Covered Medicare Preventive Services."

## 2019-09-01 ENCOUNTER — Other Ambulatory Visit: Payer: Self-pay | Admitting: Family Medicine

## 2019-09-01 DIAGNOSIS — I10 Essential (primary) hypertension: Secondary | ICD-10-CM

## 2019-09-01 NOTE — Telephone Encounter (Signed)
Requested medication (s) are due for refill today: no  Requested medication (s) are on the active medication list: med expired 08/21/19  Last refill:  08/10/19 Future visit scheduled: NO  Notes to clinic:  medication expired 08/21/19. Please review and advise.   Requested Prescriptions  Pending Prescriptions Disp Refills   hydrALAZINE (APRESOLINE) 25 MG tablet [Pharmacy Med Name: HYDRALAZINE 25 MG TABLET] 90 tablet 1    Sig: TAKE 1 TABLET BY MOUTH 3 TIMES DAILY. OK TO START AT 1/2 PILL 3 TIMES A DAY FOR FIRST 3 DAYS, MONITOR HOME READINGS.      Cardiovascular:  Vasodilators Passed - 09/01/2019 10:34 AM      Passed - HCT in normal range and within 360 days    Hematocrit  Date Value Ref Range Status  07/31/2019 42.1 37.5 - 51.0 % Final          Passed - HGB in normal range and within 360 days    Hemoglobin  Date Value Ref Range Status  07/31/2019 14.4 13.0 - 17.7 g/dL Final          Passed - RBC in normal range and within 360 days    RBC  Date Value Ref Range Status  07/31/2019 4.54 4.14 - 5.80 x10E6/uL Final  10/20/2018 4.29 4.22 - 5.81 MIL/uL Final          Passed - WBC in normal range and within 360 days    WBC  Date Value Ref Range Status  07/31/2019 5.1 3.4 - 10.8 x10E3/uL Final  10/20/2018 6.1 4.0 - 10.5 K/uL Final          Passed - PLT in normal range and within 360 days    Platelets  Date Value Ref Range Status  07/31/2019 186 150 - 450 x10E3/uL Final   Platelet Count, POC  Date Value Ref Range Status  07/11/2017 499 (A) 142 - 424 K/uL Final          Passed - Last BP in normal range    BP Readings from Last 1 Encounters:  08/27/19 108/62          Passed - Valid encounter within last 12 months    Recent Outpatient Visits           5 days ago Medicare annual wellness visit, subsequent   [facility], Ranell Patrick, MD   2 weeks ago Elevated serum creatinine   [facility], Ranell Patrick, MD   3 weeks ago Type 2 diabetes  mellitus with chronic kidney disease, without long-term current use of insulin, unspecified CKD stage [facility])   [facility], Ranell Patrick, MD   1 month ago Acute gout of right foot, unspecified cause   [facility], Ranell Patrick, MD   6 months ago Essential hypertension   [facility], Ranell Patrick, MD       Future Appointments             In 1 month [name], MD [place], [facility]

## 2019-09-03 DIAGNOSIS — N183 Chronic kidney disease, stage 3 unspecified: Secondary | ICD-10-CM | POA: Diagnosis not present

## 2019-09-07 MED ORDER — ISOSORBIDE MONONITRATE ER 60 MG PO TB24: 60.0000 mg | ORAL_TABLET | Freq: Every day | ORAL | 2 refills | Status: DC

## 2019-09-07 NOTE — Telephone Encounter (Signed)
[name], MD  [place] Heir L 29 minutes ago (4:15 PM)   Please increase imdur to 60 mg po daily and have him take it at night, also continue BP diary and send in 2 weeks, thank you    Pt made aware via mychart to increase his imdur to 60 mg po daily at nighttime, continue to BP diary and send [place] some readings in 2 weeks.  Informed the pt via mychart that new dose increase for imdur was sent to his local pharmacy with reflected instructions above.  Advised the pt to call back with any questions or concerns about this.

## 2019-09-10 DIAGNOSIS — N183 Chronic kidney disease, stage 3 unspecified: Secondary | ICD-10-CM | POA: Diagnosis not present

## 2019-09-10 DIAGNOSIS — I129 Hypertensive chronic kidney disease with stage 1 through stage 4 chronic kidney disease, or unspecified chronic kidney disease: Secondary | ICD-10-CM | POA: Diagnosis not present

## 2019-09-12 DIAGNOSIS — E119 Type 2 diabetes mellitus without complications: Secondary | ICD-10-CM | POA: Diagnosis not present

## 2019-09-13 DIAGNOSIS — E119 Type 2 diabetes mellitus without complications: Secondary | ICD-10-CM | POA: Diagnosis not present

## 2019-09-17 ENCOUNTER — Encounter: Payer: Self-pay | Admitting: *Deleted

## 2019-09-17 ENCOUNTER — Other Ambulatory Visit: Payer: Self-pay | Admitting: *Deleted

## 2019-09-17 NOTE — Patient Outreach (Signed)
[place] [facility]) Care Management  09/17/2019  Antonio Wells 26-Jun-1946 [id number]  Unsuccessful outreach attempt made to patient for screening.  RN Health Coach left HIPAA compliant voicemail message along with her contact information.  Plan: RN Health Coach will call patient within 3-4 business days  RN Health Coach will send unsuccessful letter   [name] RN, BSN [name] [phone] [name]

## 2019-09-18 DIAGNOSIS — E119 Type 2 diabetes mellitus without complications: Secondary | ICD-10-CM | POA: Diagnosis not present

## 2019-09-18 DIAGNOSIS — I129 Hypertensive chronic kidney disease with stage 1 through stage 4 chronic kidney disease, or unspecified chronic kidney disease: Secondary | ICD-10-CM | POA: Diagnosis not present

## 2019-09-18 DIAGNOSIS — N183 Chronic kidney disease, stage 3 unspecified: Secondary | ICD-10-CM | POA: Diagnosis not present

## 2019-09-19 ENCOUNTER — Other Ambulatory Visit: Payer: Self-pay | Admitting: *Deleted

## 2019-09-19 ENCOUNTER — Ambulatory Visit: Payer: Self-pay | Admitting: *Deleted

## 2019-09-19 NOTE — Patient Outreach (Signed)
[place] [facility]) Care Management  09/19/2019  BUCKLEY SEGOVIA 1946/06/28 [id number]   Successful telephone outreach call to patient for screening. HIPAA identifiers obtained. Nurse reviewed [facility] program and services with patient. Patient stated that he did not feel like the services would benefit him at this time. Mr. Biamonte did state that he would like a pamphlet to be mailed to him and he would contact [facility] if he needed the services in the future.  Plan: RN Health Coach will send Successful letter with pamphlet.  [name] RN, BSN [place] [phone] [email or web address]

## 2019-09-21 ENCOUNTER — Ambulatory Visit: Payer: Self-pay | Admitting: *Deleted

## 2019-10-30 ENCOUNTER — Other Ambulatory Visit: Payer: Self-pay

## 2019-10-30 ENCOUNTER — Encounter: Payer: Self-pay | Admitting: Cardiology

## 2019-10-30 ENCOUNTER — Ambulatory Visit: Payer: Medicare Other | Admitting: Cardiology

## 2019-10-30 VITALS — BP 118/74 | HR 86 | Ht 63.0 in | Wt 198.6 lb

## 2019-10-30 DIAGNOSIS — I10 Essential (primary) hypertension: Secondary | ICD-10-CM

## 2019-10-30 DIAGNOSIS — Z951 Presence of aortocoronary bypass graft: Secondary | ICD-10-CM | POA: Diagnosis not present

## 2019-10-30 DIAGNOSIS — E785 Hyperlipidemia, unspecified: Secondary | ICD-10-CM

## 2019-10-30 DIAGNOSIS — I5032 Chronic diastolic (congestive) heart failure: Secondary | ICD-10-CM | POA: Diagnosis not present

## 2019-10-30 MED ORDER — HYDRALAZINE HCL 50 MG PO TABS: ORAL_TABLET | ORAL | 2 refills | Status: DC

## 2019-10-30 NOTE — Progress Notes (Signed)
Cardiology Office Note:    Date:  10/30/2019   ID:  [name], DOB 1945/09/01, MRN [id number]  PCP:  [name], MD  Cardiologist:  No primary care provider on file.  Electrophysiologist:  None   Referring MD: [name], MD   Reason for visit: 3 months follow-up  History of Present Illness:    Antonio Wells is a 74 y.o. male with a hx of  CAD with unstable angina s/p recent CABGx3 in 05/2017 with post-op atrial fib, HTN, HLD, DM, mild AS/MR, CKD stage III, sleep apnea who presents for post-op follow-up. He was evaluated for unstable angina in October 2018 with abnormal cardiac CT. Cardiac cath showed obstructive 2V disease. Pre-op echo showed mild LVH, EF 60-65%, grade 1 DD, mild AS/MR. He returned for outpatient CABG and underwent CABGx3 with LIMA-LAD and sequential SVG to OM1/OM3. He did have post-op atrial fib treated with amiodarone. At post-op follow-up with primary care he reported persistent nausea so amiodarone was stopped after discussion with our office. He was unfortunately subsequently admitted 12/17-12/20 with persistent failure to thrive, weight loss, thrush and dehydration. He had evidence of AKI and was treated with IV fluids for hypernatremia. 30-day event monitor was recommended given d/c of amiodarone which was negative for recurrent atrial fib. F/u labs by primary care 07/30/17 showed Hgb 12.4, K 4.7, Cr 1.38 (back near baseline). Otherwise recent LFTs were normal and LDL was 81 in 04/2017.  08/20/2018 this is 1 year follow-up, patient has been doing well last year, he denies any chest pain feels slightly more short of breath but he has been struggling with high blood pressure in the 1 70-1 80 range and this morning to 36 after he forgot to take the last dose of hydralazine last night.  He is motivated to start exercising again he got himself a stationary bicycle.  He denies any dizziness, headaches presyncope or syncope.  He is being followed by nephrology  as well.  10/30/2019 - 3 months follow up, he has been doing great, however after we adjusted his blood pressure medications he has been feeling tired and sometimes even dizzy especially after he takes his morning medications.  He brings in excellent blood pressure diaries with dates and times and his blood pressure gets high from 130-160 range in the morning before he takes his medications however drops as low as 90 mmHg afterwards.  He is lower extremity edema is slightly worse, otherwise denies chest pain or shortness of breath.  He has started to bike.   Past Medical History:  Diagnosis Date  . Arthritis    "knees" (07/11/2017)  . CKD (chronic kidney disease), stage III   . Coronary artery disease    a.  CABGx3 in 05/2017.  Marland Kitchen Gout   . Heart murmur   . High cholesterol   . Hypertension   . Mild aortic stenosis   . Mild mitral regurgitation   . OSA on CPAP   . Postoperative atrial fibrillation ([place]) 06/2017  . Sleep apnea   . Type II diabetes mellitus ([place])     Past Surgical History:  Procedure Laterality Date  . CARDIAC CATHETERIZATION    . COLONOSCOPY  2008  . CORONARY ARTERY BYPASS GRAFT N/A 06/24/2017   Procedure: CORONARY ARTERY BYPASS GRAFTING (CABG) x 3 ; Using Left Internal Mammary Artery and Right Great Saphenous Leg Vein Harvested Endoscopically;  Surgeon: Gaye Pollack, MD;  Location: [facility];  Service: Open Heart Surgery;  Laterality: N/A;  . EYE SURGERY    . LEFT HEART CATH AND CORONARY ANGIOGRAPHY N/A 05/25/2017   Procedure: LEFT HEART CATH AND CORONARY ANGIOGRAPHY;  Surgeon: Wellington Hampshire, MD;  Location: [facility];  Service: Cardiovascular;  Laterality: N/A;  . TEE WITHOUT CARDIOVERSION N/A 06/24/2017   Procedure: TRANSESOPHAGEAL ECHOCARDIOGRAM (TEE);  Surgeon: Gaye Pollack, MD;  Location: [facility];  Service: Open Heart Surgery;  Laterality: N/A;    Current Medications: Current Meds  Medication Sig  . acetaminophen (TYLENOL) 325 MG tablet Take 650  mg by mouth every 6 (six) hours as needed for mild pain.  Marland Kitchen aspirin EC 81 MG tablet Take 1 tablet (81 mg total) by mouth daily.  Marland Kitchen atorvastatin (LIPITOR) 80 MG tablet TAKE 1 TABLET BY MOUTH  DAILY AT 6 PM.  . carvedilol (COREG) 25 MG tablet Take 25 mg by mouth daily.  . chlorthalidone (HYGROTON) 25 MG tablet Take 12.5 mg by mouth every morning.   . colchicine 0.6 MG tablet TAKE 2 TABS ON ONSET OF GOUT FLARE THEN REPEAT IN 1 HOUR OF ACUTE GOUT ATTACK  . glipiZIDE (GLUCOTROL) 5 MG tablet TAKE 1 TABLET BY MOUTH  DAILY BEFORE BREAKFAST  . irbesartan (AVAPRO) 300 MG tablet Take 300 mg by mouth daily.  . isosorbide mononitrate (IMDUR) 60 MG 24 hr tablet Take 1 tablet (60 mg total) by mouth at bedtime.  . metFORMIN (GLUCOPHAGE) 500 MG tablet TAKE 1 TABLET BY MOUTH TWO  TIMES DAILY WITH A MEAL  . [DISCONTINUED] hydrALAZINE (APRESOLINE) 100 MG tablet Take 100 mg by mouth in the morning and at bedtime.     Allergies:   Penicillins, Lisinopril, Amiodarone, Amlodipine, Oxycodone, Zofran [ondansetron hcl], Cortisone, and Tramadol   Social History   Socioeconomic History  . Marital status: Married    Spouse name: [name]  . Number of children: 3  . Years of education: 64  . Highest education level: Not on file  Occupational History  . Occupation: Musician  Tobacco Use  . Smoking status: Former Smoker    Packs/day: 2.00    Years: 15.00    Pack years: 30.00    Types: Cigarettes    Quit date: 07/26/1998    Years since quitting: 21.2  . Smokeless tobacco: Never Used  Substance and Sexual Activity  . Alcohol use: No    Alcohol/week: 0.0 standard drinks  . Drug use: No  . Sexual activity: Yes  Other Topics Concern  . Not on file  Social History Narrative         Social Determinants of Health   Financial Resource Strain:   . Difficulty of Paying Living Expenses:   Food Insecurity:   . Worried About Charity fundraiser in the Last Year:   . Arboriculturist in the Last Year:   Transportation  Needs:   . Film/video editor (Medical):   Marland Kitchen Lack of Transportation (Non-Medical):   Physical Activity:   . Days of Exercise per Week:   . Minutes of Exercise per Session:   Stress:   . Feeling of Stress :   Social Connections:   . Frequency of Communication with Friends and Family:   . Frequency of Social Gatherings with Friends and Family:   . Attends Religious Services:   . Active Member of Clubs or Organizations:   . Attends Archivist Meetings:   Marland Kitchen Marital Status:      Family History: The patient's family history includes Cancer  in his brother; Colon cancer in his brother; Diabetes in his mother; Hypertension in his father and mother; Kidney disease in his mother; Stroke in his brother. There is no history of Esophageal cancer, Rectal cancer, or Stomach cancer.  ROS:   Please see the history of present illness.    All other systems reviewed and are negative.  EKGs/Labs/Other Studies Reviewed:    The following studies were reviewed today:  EKG:  EKG is ordered today.  The ekg ordered today demonstrates normal sinus rhythm, normal EKG, unchanged from prior.  This was personally reviewed.  Recent Labs: 07/31/2019: ALT 31; Hemoglobin 14.4; Platelets 186; TSH 4.300 08/17/2019: BUN 14; Creatinine, Ser 1.51; Potassium 4.7; Sodium 147  Recent Lipid Panel    Component Value Date/Time   CHOL 138 07/31/2019 0736   TRIG 126 07/31/2019 0736   HDL 47 07/31/2019 0736   CHOLHDL 2.9 07/31/2019 0736   CHOLHDL 2.8 02/24/2016 0749   VLDL 24 02/24/2016 0749   LDLCALC 69 07/31/2019 0736    Physical Exam:    VS:  BP 118/74   Pulse 86   Ht 5\' 3"  (1.6 m)   Wt 198 lb 9.6 oz (90.1 kg)   SpO2 96%   BMI 35.18 kg/m     Wt Readings from Last 3 Encounters:  10/30/19 198 lb 9.6 oz (90.1 kg)  08/27/19 202 lb (91.6 kg)  08/21/19 202 lb (91.6 kg)     GEN:  Well nourished, well developed in no acute distress HEENT: Normal NECK: No JVD; No carotid bruits LYMPHATICS: No  lymphadenopathy CARDIAC: RRR, 3 out of 6 holosystolic murmurs, rubs, gallops RESPIRATORY:  Clear to auscultation without rales, wheezing or rhonchi  ABDOMEN: Soft, non-tender, non-distended MUSCULOSKELETAL: Trivial bilateral edema around the ankles; No deformity  SKIN: Warm and dry NEUROLOGIC:  Alert and oriented x 3 PSYCHIATRIC:  Normal affect   ASSESSMENT:    1. S/P CABG x 3   2. Hyperlipidemia, unspecified hyperlipidemia type   3. Chronic diastolic CHF (congestive heart failure), NYHA class 2 ([place])   4. Essential hypertension    PLAN:    In order of problems listed above:  1. Hypertension -patient's blood pressure is now significantly improved, he gets hypotensive after all the morning medications, will decrease morning dose of hydralazine to 50 mg daily.  Patient will now start exercising, I believe his blood pressure will get significantly better, he will continue sending me blood pressure measurements along with his symptoms and we will try to adjust doses as needed.  He is also advised to try to lose 10 pounds.  2. CAD -he is asymptomatic.   3. Paroxysmal atrial fib -no recurrent episodes.  On aspirin only. 4. Acute on chronic diastolic CHF, appears euvolemic  5. Hyperlipidemia -lipids are at goal, Lipitor is well-tolerated. 6.   Mild aortic stenosis - we will repeat an echocardiogram next year.  Medication Adjustments/Labs and Tests Ordered: Current medicines are reviewed at length with the patient today.  Concerns regarding medicines are outlined above.  Orders Placed This Encounter  Procedures  . EKG 12-Lead   Meds ordered this encounter  Medications  . hydrALAZINE (APRESOLINE) 50 MG tablet    Sig: Take 1 tablet (50 mg total) by mouth in the AM, then take 2 tablets (100 mg total) by mouth in the PM, everyday.    Dispense:  270 tablet    Refill:  2    There are no Patient Instructions on file for this visit.  Signed, Ena Dawley, MD  10/30/2019 9:14 AM      [place]

## 2019-10-30 NOTE — Patient Instructions (Signed)
Medication Instructions:   START TAKING YOUR HYDRALAZINE AS FOLLOWS:  TAKE HYDRALAZINE 50 MG BY MOUTH IN THE AM, THEN TAKE 100 MG BY MOUTH IN THE PM EVERYDAY.  *If you need a refill on your cardiac medications before your next appointment, please call your pharmacy*   Follow-Up: At [facility], you and your health needs are our priority.  As part of our continuing mission to provide you with exceptional heart care, we have created designated Provider Care Teams.  These Care Teams include your primary Cardiologist (physician) and Advanced Practice Providers (APPs -  Physician Assistants and Nurse Practitioners) who all work together to provide you with the care you need, when you need it.  We recommend signing up for the patient portal called "MyChart".  Sign up information is provided on this After Visit Summary.  MyChart is used to connect with patients for Virtual Visits (Telemedicine).  Patients are able to view lab/test results, encounter notes, upcoming appointments, etc.  Non-urgent messages can be sent to your provider as well.   To learn more about what you can do with MyChart, go to [email or web address].    Your next appointment:   6 month(s)  The format for your next appointment:   In Person  Provider:   Ena Dawley, MD

## 2019-11-06 ENCOUNTER — Other Ambulatory Visit: Payer: Self-pay

## 2019-11-06 MED ORDER — CARVEDILOL 25 MG PO TABS: 25.0000 mg | ORAL_TABLET | Freq: Every day | ORAL | 3 refills | Status: DC

## 2019-11-21 ENCOUNTER — Ambulatory Visit: Payer: Medicare Other | Admitting: Family Medicine

## 2019-11-23 ENCOUNTER — Ambulatory Visit (INDEPENDENT_AMBULATORY_CARE_PROVIDER_SITE_OTHER): Payer: Medicare Other | Admitting: Family Medicine

## 2019-11-23 ENCOUNTER — Other Ambulatory Visit: Payer: Self-pay

## 2019-11-23 ENCOUNTER — Encounter: Payer: Self-pay | Admitting: Family Medicine

## 2019-11-23 VITALS — BP 124/65 | HR 79 | Temp 98.5°F | Ht 63.0 in | Wt 198.0 lb

## 2019-11-23 DIAGNOSIS — N529 Male erectile dysfunction, unspecified: Secondary | ICD-10-CM

## 2019-11-23 DIAGNOSIS — E1122 Type 2 diabetes mellitus with diabetic chronic kidney disease: Secondary | ICD-10-CM

## 2019-11-23 DIAGNOSIS — N481 Balanitis: Secondary | ICD-10-CM | POA: Diagnosis not present

## 2019-11-23 DIAGNOSIS — N471 Phimosis: Secondary | ICD-10-CM

## 2019-11-23 LAB — HEMOGLOBIN A1C
Est. average glucose Bld gHb Est-mCnc: 148 mg/dL
Hgb A1c MFr Bld: 6.8 % — ABNORMAL HIGH (ref 4.8–5.6)

## 2019-11-23 LAB — POCT SKIN KOH: Skin KOH, POC: NEGATIVE

## 2019-11-23 MED ORDER — FLUCONAZOLE 150 MG PO TABS: 150.0000 mg | ORAL_TABLET | Freq: Once | ORAL | 0 refills | Status: AC

## 2019-11-23 MED ORDER — CLOTRIMAZOLE 1 % EX CREA: 1.0000 "application " | TOPICAL_CREAM | Freq: Two times a day (BID) | CUTANEOUS | 0 refills | Status: DC

## 2019-11-23 NOTE — Progress Notes (Signed)
Subjective:  Patient ID: Antonio Wells, male    DOB: 1946/04/15  Age: 74 y.o. MRN: [id number]  CC:  Chief Complaint  Patient presents with   Erectile Dysfunction    pt would like to speak with provioder about possible medications. pt reports good energy levels.    HPI Antonio Wells presents for   Diabetes: Complicated by chronic kidney disease, microalbumunuria. .nephrology - Dr. [name]. - appt 5/15. Has blood work scheduled today for that visit.  Still on glipizide 66m qd, metformin 5059mBID. No new side effects.  Microalbumin: ratio 76 in July 2020.  On ARB, statin.  Last GFR in January 53%.  Optho, foot exam, pneumovax:  Refuses pneumovax.  S/p both covid vaccines.    Lab Results  Component Value Date   HGBA1C 6.8 (H) 11/23/2019   HGBA1C 7.2 (H) 02/07/2019   HGBA1C 6.5 (H) 08/07/2018   Lab Results  Component Value Date   MICROALBUR 39.6 08/25/2015   LDLCALC 69 07/31/2019   CREATININE 1.51 (H) 08/17/2019   Hypertension: With CAD, s/p CABG x 3. Cardiology visit 4/6 - decreased am dose hydralazine to 5070mAm, 100 in evening.  Home readings - similar to in office.  Plan to increase exercise. [facility] has treated knee pain in past - some flair recently.  BP Readings from Last 3 Encounters:  11/23/19 124/65  10/30/19 118/74  08/27/19 108/62   Lab Results  Component Value Date   CREATININE 1.51 (H) 08/17/2019   Erectile dysfunction: Discussed in past, meds deferred due to prior blood pressure lability, cardiac disease.  Has noticed tight foreskin over the past 6 months especially, he is not circumcised.  Has noticed some slight redness of the foreskin in the head of the penis but has not had any retraction of foreskin that has then gotten stuck.  Last erection he remembers was 1 year ago, not having morning erections or erections otherwise.  Not taking any supplements right now.  In the past he did try stopping his medications with improvement in  erections but has not done so recently.  Has noticed slight discharge around the head of the penis past few weeks, no fevers.  Has noticed met with urology previously.  has not been sexually active in some time.   History Patient Active Problem List   Diagnosis Date Noted   Hypertensive encephalopathy 10/19/2018   Chronic kidney disease, stage 3 10/19/2018   DM (diabetes mellitus), type 2 with renal complications ([id number]/[id number]/9702Cough 03/17/2018   Lower resp. 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   CAD (coronary artery disease) 05/25/2017   Unstable angina (HCC)    Hypertensive heart disease 07/14/2015   Lower extremity edema 07/14/2015   Aortic stenosis 01/09/2015   OSA on CPAP 12/12/2014   Hypersomnia with sleep apnea 12/12/2014   Obstructive sleep apnea (adult) (pediatric) 02/04/2014   Hypersomnia, persistent 02/04/2014   Obesity 02/04/2014   Mitral regurgitation and aortic stenosis 01/02/2014   DOE (dyspnea on exertion) 11/26/2013   Gout 08/12/2011   Hypertension 08/12/2011   High cholesterol 08/12/2011   OSA (obstructive sleep apnea) 08/12/2011   Diabetes type 2, uncontrolled ([facility]1/17/2013   Past Medical History:  Diagnosis Date   Arthritis    "knees" (07/11/2017)  CKD (chronic kidney disease), stage III    Coronary artery disease    a.  CABGx3 in 05/2017.   Gout    Heart murmur    High cholesterol    Hypertension    Mild aortic stenosis    Mild mitral regurgitation    OSA on CPAP    Postoperative atrial fibrillation ([place]) 06/2017   Sleep apnea    Type II diabetes mellitus ([place])    Past Surgical History:  Procedure Laterality Date   CARDIAC CATHETERIZATION     COLONOSCOPY  2008   CORONARY ARTERY BYPASS  GRAFT N/A 06/24/2017   Procedure: CORONARY ARTERY BYPASS GRAFTING (CABG) x 3 ; Using Left Internal Mammary Artery and Right Great Saphenous Leg Vein Harvested Endoscopically;  Surgeon: Gaye Pollack, MD;  Location: [facility];  Service: Open Heart Surgery;  Laterality: N/A;   EYE SURGERY     LEFT HEART CATH AND CORONARY ANGIOGRAPHY N/A 05/25/2017   Procedure: LEFT HEART CATH AND CORONARY ANGIOGRAPHY;  Surgeon: Wellington Hampshire, MD;  Location: [place] [facility] [facility];  Service: Cardiovascular;  Laterality: N/A;   TEE WITHOUT CARDIOVERSION N/A 06/24/2017   Procedure: TRANSESOPHAGEAL ECHOCARDIOGRAM (TEE);  Surgeon: Gaye Pollack, MD;  Location: [facility];  Service: Open Heart Surgery;  Laterality: N/A;   Allergies  Allergen Reactions   Penicillins Other (See Comments)    Has patient had a PCN reaction causing immediate rash, facial/tongue/throat swelling, SOB or lightheadedness with hypotension: No Has patient had a PCN reaction causing severe rash involving mucus membranes or skin necrosis: No Has patient had a PCN reaction that required hospitalization: No Has patient had a PCN reaction occurring within the last 10 years: No If all of the above answers are "NO", then may proceed with Cephalosporin use.   Passed out ? SYNCOPE ?   Lisinopril Cough   Amiodarone Nausea And Vomiting   Amlodipine Other (See Comments)    Causes dizzines   Oxycodone Other (See Comments)    hallucinations   Zofran [Ondansetron Hcl] Other (See Comments)    Has an interaction with another medication pt takes    Cortisone Nausea And Vomiting and Other (See Comments)    HICCUPS   Tramadol Nausea And Vomiting and Other (See Comments)    HICCUPS   Prior to Admission medications   Medication Sig Start Date End Date Taking? Authorizing Provider  acetaminophen (TYLENOL) 325 MG tablet Take 650 mg by mouth every 6 (six) hours as needed for mild pain.   Yes [provider]  aspirin EC 81 MG tablet Take 1  tablet (81 mg total) by mouth daily. 09/02/17  Yes Dunn, Dayna N, PA-C  atorvastatin (LIPITOR) 80 MG tablet TAKE 1 TABLET BY MOUTH  DAILY AT 6 PM. 02/07/19  Yes [name], MD  carvedilol (COREG) 25 MG tablet Take 1 tablet (25 mg total) by mouth daily. 11/06/19  Yes [name], MD  chlorthalidone (HYGROTON) 25 MG tablet Take 12.5 mg by mouth every morning.  09/10/19  Yes [provider]  colchicine 0.6 MG tablet TAKE 2 TABS ON ONSET OF GOUT FLARE THEN REPEAT IN 1 HOUR OF ACUTE GOUT ATTACK 07/06/19  Yes [name], MD  glipiZIDE (GLUCOTROL) 5 MG tablet TAKE 1 TABLET BY MOUTH  DAILY BEFORE BREAKFAST 02/07/19  Yes [name], MD  hydrALAZINE (APRESOLINE) 50 MG tablet Take 1 tablet (50 mg total) by mouth in the AM, then take 2 tablets (100 mg total) by mouth  in the PM, everyday. 10/30/19  Yes [name], MD  irbesartan (AVAPRO) 300 MG tablet Take 300 mg by mouth daily.   Yes [provider]  isosorbide mononitrate (IMDUR) 60 MG 24 hr tablet Take 1 tablet (60 mg total) by mouth at bedtime. 09/07/19  Yes [name], MD  metFORMIN (GLUCOPHAGE) 500 MG tablet TAKE 1 TABLET BY MOUTH TWO  TIMES DAILY WITH A MEAL 02/07/19  Yes [name], MD   Social History   Socioeconomic History   Marital status: Married    Spouse name: [name]   Number of children: 3   Years of education: 16   Highest education level: Not on file  Occupational History   Occupation: Musician  Tobacco Use   Smoking status: Former Smoker    Packs/day: 2.00    Years: 15.00    Pack years: 30.00    Types: Cigarettes    Quit date: 07/26/1998    Years since quitting: 21.3   Smokeless tobacco: Never Used  Substance and Sexual Activity   Alcohol use: No    Alcohol/week: 0.0 standard drinks   Drug use: No   Sexual activity: Yes  Other Topics Concern   Not on file  Social History Narrative         Social Determinants of Health   Financial Resource Strain:     Difficulty of Paying Living Expenses:   Food Insecurity:    Worried About Charity fundraiser in the Last Year:    Arboriculturist in the Last Year:   Transportation Needs:    Film/video editor (Medical):    Lack of Transportation (Non-Medical):   Physical Activity:    Days of Exercise per Week:    Minutes of Exercise per Session:   Stress:    Feeling of Stress :   Social Connections:    Frequency of Communication with Friends and Family:    Frequency of Social Gatherings with Friends and Family:    Attends Religious Services:    Active Member of Clubs or Organizations:    Attends Music therapist:    Marital Status:   Intimate Partner Violence:    Fear of Current or Ex-Partner:    Emotionally Abused:    Physically Abused:    Sexually Abused:     Review of Systems  Constitutional: Negative for fatigue and unexpected weight change.  Eyes: Negative for visual disturbance.  Respiratory: Positive for shortness of breath (mild, chronic, no recent changes). Negative for cough and chest tightness.   Cardiovascular: Negative for chest pain, palpitations and leg swelling.  Gastrointestinal: Negative for abdominal pain and blood in stool.  Genitourinary: Positive for difficulty urinating (difficulty with urinating straight. ) and discharge. Negative for scrotal swelling and testicular pain.  Neurological: Negative for dizziness, light-headedness and headaches.    Objective:   Vitals:   11/23/19 1033  BP: 124/65  Pulse: 79  Temp: 98.5 F (36.9 C)  TempSrc: Temporal  SpO2: 97%  Weight: 198 lb (89.8 kg)  Height: 5' 3"  (1.6 m)     Physical Exam Vitals reviewed.  Constitutional:      Appearance: He is well-developed.  HENT:     Head: Normocephalic and atraumatic.  Eyes:     Pupils: Pupils are equal, round, and reactive to light.  Neck:     Vascular: No carotid bruit or JVD.  Cardiovascular:     Rate and Rhythm: Normal rate and regular  rhythm.  Heart sounds: Normal heart sounds. No murmur.  Pulmonary:     Effort: Pulmonary effort is normal.     Breath sounds: Normal breath sounds. No rales.  Genitourinary:    Testes: Normal.        Right: Tenderness not present.        Left: Tenderness not present.     Epididymis:     Right: No tenderness.     Left: No tenderness.    Skin:    General: Skin is warm and dry.  Neurological:     Mental Status: He is alert and oriented to person, place, and time.     Results for orders placed or performed in visit on 11/23/19  Hemoglobin A1c  Result Value Ref Range   Hgb A1c MFr Bld 6.8 (H) 4.8 - 5.6 %   Est. average glucose Bld gHb Est-mCnc 148 mg/dL  POCT Skin KOH  Result Value Ref Range   Skin KOH, POC Negative Negative      Assessment & Plan:  Antonio Wells is a 74 y.o. male . Type 2 diabetes mellitus with chronic kidney disease, without long-term current use of insulin, unspecified CKD stage ([place]) - Plan: Hemoglobin A1c  -Tolerating current medications, A1c controlled. Continue follow-up with nephrology.  Monitor renal function to determine safety of use of Metformin.  Erectile dysfunction, unspecified erectile dysfunction type - Plan: Ambulatory referral to Urology Phimosis - Plan: Ambulatory referral to Urology Balanitis - Plan: POCT Skin KOH, fluconazole (DIFLUCAN) 150 MG tablet, clotrimazole (LOTRIMIN) 1 % cream, Ambulatory referral to Urology  -Appears to have significant phimosis with narrow aperture.  Unable to visualize full glans but suspect component of balanitis.  Start Diflucan 150 mg x 1, Lotrimin cream, refer to urology.  -In regards to erectile dysfunction, also deferred medication at this time as with significant phimosis concern for for paraphimosis or tear of skin.  With complete absence of erections, vascular component possible.  Will defer to urology.  Meds ordered this encounter  Medications   fluconazole (DIFLUCAN) 150 MG tablet    Sig:  Take 1 tablet (150 mg total) by mouth once for 1 dose.    Dispense:  1 tablet    Refill:  0   clotrimazole (LOTRIMIN) 1 % cream    Sig: Apply 1 application topically 2 (two) times daily. Inside foreskin for 1 week.    Dispense:  30 g    Refill:  0   Patient Instructions   Scan copy of covid vaccine and send to me if possible. Diflucan once and then clotrimazole creams for possible fungal infection at foreskin, but will also refer you to urology to discuss treatment of tight foreskin as well as discussion of erection difficulty.  No med changes for now. I will check diabetes test. Please have your nephrologist forward me a copy of your upcoming labs if possible.   Thanks for coming in today.   If you have lab work done today you will be contacted with your lab results within the next 2 weeks.  If you have not heard from [place] then please contact us. The fastest way to get your results is to register for My Chart.   IF you received an x-ray today, you will receive an invoice from [facility]. Please contact [facility] at [phone] with questions or concerns regarding your invoice.   IF you received labwork today, you will receive an invoice from [facility]. Please contact [facility] at [phone] with questions or concerns  regarding your invoice.   Our billing staff will not be able to assist you with questions regarding bills from these companies.  You will be contacted with the lab results as soon as they are available. The fastest way to get your results is to activate your My Chart account. Instructions are located on the last page of this paperwork. If you have not heard from [place] regarding the results in 2 weeks, please contact this office.    Signed, [name], MD [facility]

## 2019-11-23 NOTE — Patient Instructions (Addendum)
Scan copy of covid vaccine and send to me if possible. Diflucan once and then clotrimazole creams for possible fungal infection at foreskin, but will also refer you to urology to discuss treatment of tight foreskin as well as discussion of erection difficulty.  No med changes for now. I will check diabetes test. Please have your nephrologist forward me a copy of your upcoming labs if possible.   Thanks for coming in today.   If you have lab work done today you will be contacted with your lab results within the next 2 weeks.  If you have not heard from [place] then please contact us. The fastest way to get your results is to register for My Chart.   IF you received an x-ray today, you will receive an invoice from [facility]. Please contact [facility] at [phone] with questions or concerns regarding your invoice.   IF you received labwork today, you will receive an invoice from [facility]. Please contact [facility] at [phone] with questions or concerns regarding your invoice.   Our billing staff will not be able to assist you with questions regarding bills from these companies.  You will be contacted with the lab results as soon as they are available. The fastest way to get your results is to activate your My Chart account. Instructions are located on the last page of this paperwork. If you have not heard from [place] regarding the results in 2 weeks, please contact this office.

## 2019-11-25 ENCOUNTER — Encounter: Payer: Self-pay | Admitting: Family Medicine

## 2019-11-30 ENCOUNTER — Other Ambulatory Visit: Payer: Self-pay

## 2019-11-30 ENCOUNTER — Telehealth: Payer: Self-pay | Admitting: Family Medicine

## 2019-11-30 DIAGNOSIS — J22 Unspecified acute lower respiratory infection: Secondary | ICD-10-CM

## 2019-11-30 NOTE — Telephone Encounter (Signed)
PCP: Dr Nyoka Cowden PMH: DM2 and CKD3 Patient has been on colchicine for 3 days wo significant improvement in pain or swelling in ankle He has been taking once a day  He reports that he in the past he has had 3 knee injections with steroids 2nd and 3rd injection had nausea and vomiting and hiccups and was told he was allergic to cortisone  He does not remember ever having oral steroids  Thinks gout trigger was red meat He does not drink etoh  Discussed with patient very limited treatment options Unsure if reactions to injections would cross to oral medications Unable to use nsaids Discussed increasing colchicine to BID, push fluids and avoid food triggers If not better by Monday to seek care in clinic  Patient agreed with plan  Lab Results  Component Value Date   HGBA1C 6.8 (H) 11/23/2019   Lab Results  Component Value Date   CREATININE 1.51 (H) 08/17/2019

## 2019-11-30 NOTE — Telephone Encounter (Signed)
Pt is having flare up of Gout. He is experiencing symptoms of pain, he can't walk and his foot is swollen. He is taking colchicine 0.6 MG tablet [id number], he feels this is giving him diarrhea. He would like to be given a different script for this. Pharmacy  [id number] - [place], [address] [place], [place] [address]  Phone:  [phone] Fax   Please advise at [phone].

## 2019-12-01 ENCOUNTER — Other Ambulatory Visit: Payer: Self-pay | Admitting: Family Medicine

## 2019-12-01 DIAGNOSIS — I2510 Atherosclerotic heart disease of native coronary artery without angina pectoris: Secondary | ICD-10-CM

## 2019-12-01 DIAGNOSIS — E1122 Type 2 diabetes mellitus with diabetic chronic kidney disease: Secondary | ICD-10-CM

## 2019-12-01 NOTE — Telephone Encounter (Signed)
Requested Prescriptions  Pending Prescriptions Disp Refills  . metFORMIN (GLUCOPHAGE) 500 MG tablet [Pharmacy Med Name: MetFORMIN 500MG TABLET] 180 tablet 1    Sig: TAKE 1 TABLET BY MOUTH  TWICE DAILY WITH A MEAL     Endocrinology:  Diabetes - Biguanides Failed - 12/01/2019 10:34 PM      Failed - Cr in normal range and within 360 days    Creat  Date Value Ref Range Status  04/22/2016 1.34 (H) 0.70 - 1.25 mg/dL Final    Comment:      For patients > or = 73 years of age: The upper reference limit for Creatinine is approximately 13% higher for people identified as African-American.      Creatinine, Ser  Date Value Ref Range Status  08/17/2019 1.51 (H) 0.76 - 1.27 mg/dL Final         Failed - eGFR in normal range and within 360 days    GFR, Est African American  Date Value Ref Range Status  10/15/2015 61 >=60 mL/min Final   GFR calc Af Amer  Date Value Ref Range Status  08/17/2019 53 (L) >59 mL/min/1.73 Final   GFR, Est Non African American  Date Value Ref Range Status  10/15/2015 53 (L) >=60 mL/min Final    Comment:      The estimated GFR is a calculation valid for adults (>=51 years old) that uses the CKD-EPI algorithm to adjust for age and sex. It is   not to be used for children, pregnant women, hospitalized patients,    patients on dialysis, or with rapidly changing kidney function. According to the NKDEP, eGFR >89 is normal, 60-89 shows mild impairment, 30-59 shows moderate impairment, 15-29 shows severe impairment and <15 is ESRD.      GFR calc non Af Amer  Date Value Ref Range Status  08/17/2019 45 (L) >59 mL/min/1.73 Final   GFR  Date Value Ref Range Status  12/30/2014 62.75 >60.00 mL/min Final         Passed - HBA1C is between 0 and 7.9 and within 180 days    Hgb A1c MFr Bld  Date Value Ref Range Status  11/23/2019 6.8 (H) 4.8 - 5.6 % Final    Comment:             Prediabetes: 5.7 - 6.4          Diabetes: >6.4          Glycemic control for adults  with diabetes: <7.0          Passed - Valid encounter within last 6 months    Recent Outpatient Visits          1 week ago Type 2 diabetes mellitus with chronic kidney disease, without long-term current use of insulin, unspecified CKD stage [facility])   [facility], Ranell Patrick, MD   3 months ago Medicare annual wellness visit, subsequent   [facility] at [facility], Ranell Patrick, MD   3 months ago Elevated serum creatinine   [facility] at [facility], Ranell Patrick, MD   3 months ago Type 2 diabetes mellitus with chronic kidney disease, without long-term current use of insulin, unspecified CKD stage [facility])   [facility] at [facility], Ranell Patrick, MD   4 months ago Acute gout of right foot, unspecified cause   [facility] at [facility], Ranell Patrick, MD      Future Appointments  In 2 months [name], MD Primary Care at [place], [facility]           . atorvastatin (LIPITOR) 80 MG tablet [Pharmacy Med Name: ATORVASTATIN  80MG  TAB] 90 tablet 3    Sig: TAKE 1 TABLET BY MOUTH  DAILY AT 6 PM     Cardiovascular:  Antilipid - Statins Passed - 12/01/2019 10:34 PM      Passed - Total Cholesterol in normal range and within 360 days    Cholesterol, Total  Date Value Ref Range Status  07/31/2019 138 100 - 199 mg/dL Final         Passed - LDL in normal range and within 360 days    LDL Chol Calc (NIH)  Date Value Ref Range Status  07/31/2019 69 0 - 99 mg/dL Final         Passed - HDL in normal range and within 360 days    HDL  Date Value Ref Range Status  07/31/2019 47 >39 mg/dL Final         Passed - Triglycerides in normal range and within 360 days    Triglycerides  Date Value Ref Range Status  07/31/2019 126 0 - 149 mg/dL Final         Passed - Patient is not pregnant      Passed - Valid encounter within last 12 months    Recent Outpatient Visits          1 week ago Type 2 diabetes mellitus with chronic kidney disease, without long-term  current use of insulin, unspecified CKD stage ([place])   [facility] at [facility], Ranell Patrick, MD   3 months ago Medicare annual wellness visit, subsequent   [facility] at [facility], Ranell Patrick, MD   3 months ago Elevated serum creatinine   [facility] at [place], Ranell Patrick, MD   3 months ago Type 2 diabetes mellitus with chronic kidney disease, without long-term current use of insulin, unspecified CKD stage [facility])   [facility] at [place] [facility], Ranell Patrick, MD   4 months ago Acute gout of right foot, unspecified cause   [facility] at [facility], Ranell Patrick, MD      Future Appointments            In 2 months Carlota Raspberry Ranell Patrick, MD Primary Care at [place], [facility]           . glipiZIDE (GLUCOTROL) 5 MG tablet [Pharmacy Med Name: GLIPIZIDE  5MG  TAB] 90 tablet 3    Sig: TAKE 1 TABLET BY MOUTH  DAILY BEFORE BREAKFAST     Endocrinology:  Diabetes - Sulfonylureas Passed - 12/01/2019 10:34 PM      Passed - HBA1C is between 0 and 7.9 and within 180 days    Hgb A1c MFr Bld  Date Value Ref Range Status  11/23/2019 6.8 (H) 4.8 - 5.6 % Final    Comment:             Prediabetes: 5.7 - 6.4          Diabetes: >6.4          Glycemic control for adults with diabetes: <7.0          Passed - Valid encounter within last 6 months    Recent Outpatient Visits          1 week ago Type 2 diabetes mellitus with chronic kidney disease, without long-term current use of insulin, unspecified CKD stage (  [facility])   [facility] at [place], Ranell Patrick, MD   3 months ago Medicare annual wellness visit, subsequent   [facility] at [place] [facility], Ranell Patrick, MD   3 months ago Elevated serum creatinine   [facility] at [place] [facility], Ranell Patrick, MD   3 months ago Type 2 diabetes mellitus with chronic kidney disease, without long-term current use of insulin, unspecified CKD stage [facility])   [facility] at [place], Ranell Patrick, MD   4 months ago Acute gout of right foot, unspecified cause    [facility] at [place] [facility], Ranell Patrick, MD      Future Appointments            In 2 months [name], MD Primary Care at [place], [facility]           Creatinine of 1.51 down from 2.01 in early January 2021.

## 2019-12-03 ENCOUNTER — Telehealth: Payer: Self-pay

## 2019-12-03 NOTE — Telephone Encounter (Signed)
No ation required at this time, closing encounter from inbox 

## 2019-12-04 ENCOUNTER — Other Ambulatory Visit: Payer: Self-pay

## 2019-12-04 DIAGNOSIS — I129 Hypertensive chronic kidney disease with stage 1 through stage 4 chronic kidney disease, or unspecified chronic kidney disease: Secondary | ICD-10-CM | POA: Diagnosis not present

## 2019-12-04 MED ORDER — ISOSORBIDE MONONITRATE ER 60 MG PO TB24: 60.0000 mg | ORAL_TABLET | Freq: Every day | ORAL | 3 refills | Status: DC

## 2019-12-10 ENCOUNTER — Encounter: Payer: Self-pay | Admitting: Family Medicine

## 2019-12-13 DIAGNOSIS — I129 Hypertensive chronic kidney disease with stage 1 through stage 4 chronic kidney disease, or unspecified chronic kidney disease: Secondary | ICD-10-CM | POA: Diagnosis not present

## 2019-12-13 DIAGNOSIS — R809 Proteinuria, unspecified: Secondary | ICD-10-CM | POA: Diagnosis not present

## 2019-12-13 DIAGNOSIS — N183 Chronic kidney disease, stage 3 unspecified: Secondary | ICD-10-CM | POA: Diagnosis not present

## 2020-01-01 ENCOUNTER — Telehealth: Payer: Self-pay | Admitting: Family Medicine

## 2020-01-01 NOTE — Telephone Encounter (Signed)
Noted. Will forward to his cardiologist.  Thanks.

## 2020-01-01 NOTE — Telephone Encounter (Signed)
Diane is calling from [place] with a courtesy message that patient  had a quanitlflow screening today  and it came back showing moderate LFT PAD .

## 2020-01-01 NOTE — Telephone Encounter (Signed)
Diane is calling from [place] with a courtesy message that patient  had a quanitlflow screening today  and it came back showing moderate LFT PAD .

## 2020-01-21 ENCOUNTER — Telehealth: Payer: Self-pay

## 2020-01-21 DIAGNOSIS — E1122 Type 2 diabetes mellitus with diabetic chronic kidney disease: Secondary | ICD-10-CM

## 2020-01-21 DIAGNOSIS — E119 Type 2 diabetes mellitus without complications: Secondary | ICD-10-CM | POA: Diagnosis not present

## 2020-01-21 DIAGNOSIS — M79672 Pain in left foot: Secondary | ICD-10-CM | POA: Diagnosis not present

## 2020-01-21 MED ORDER — GLUCOSE BLOOD VI STRP: ORAL_STRIP | 12 refills | Status: DC

## 2020-01-21 MED ORDER — ONETOUCH ULTRASOFT LANCETS MISC: 12 refills | Status: DC

## 2020-01-21 NOTE — Telephone Encounter (Signed)
Called pt and refilled his lancets and one touch test strips.

## 2020-01-24 DIAGNOSIS — N476 Balanoposthitis: Secondary | ICD-10-CM | POA: Diagnosis not present

## 2020-01-24 DIAGNOSIS — N471 Phimosis: Secondary | ICD-10-CM | POA: Diagnosis not present

## 2020-01-25 DIAGNOSIS — N183 Chronic kidney disease, stage 3 unspecified: Secondary | ICD-10-CM | POA: Diagnosis not present

## 2020-01-30 ENCOUNTER — Other Ambulatory Visit: Payer: Self-pay

## 2020-01-30 DIAGNOSIS — E1122 Type 2 diabetes mellitus with diabetic chronic kidney disease: Secondary | ICD-10-CM

## 2020-01-30 MED ORDER — ONETOUCH ULTRASOFT LANCETS MISC: 1.0000 | 12 refills | Status: DC

## 2020-02-15 ENCOUNTER — Observation Stay (HOSPITAL_COMMUNITY)
Admission: EM | Admit: 2020-02-15 | Discharge: 2020-02-16 | Disposition: A | Discharge status: Home / Self Care | Payer: Medicare Other | Attending: Cardiology | Admitting: Cardiology

## 2020-02-15 ENCOUNTER — Emergency Department (HOSPITAL_COMMUNITY): Payer: Medicare Other

## 2020-02-15 ENCOUNTER — Encounter (HOSPITAL_COMMUNITY): Payer: Self-pay

## 2020-02-15 DIAGNOSIS — I25810 Atherosclerosis of coronary artery bypass graft(s) without angina pectoris: Secondary | ICD-10-CM | POA: Diagnosis not present

## 2020-02-15 DIAGNOSIS — E1129 Type 2 diabetes mellitus with other diabetic kidney complication: Secondary | ICD-10-CM | POA: Diagnosis present

## 2020-02-15 DIAGNOSIS — Z20822 Contact with and (suspected) exposure to covid-19: Secondary | ICD-10-CM | POA: Diagnosis not present

## 2020-02-15 DIAGNOSIS — I10 Essential (primary) hypertension: Secondary | ICD-10-CM | POA: Diagnosis not present

## 2020-02-15 DIAGNOSIS — E1122 Type 2 diabetes mellitus with diabetic chronic kidney disease: Secondary | ICD-10-CM | POA: Insufficient documentation

## 2020-02-15 DIAGNOSIS — Z87891 Personal history of nicotine dependence: Secondary | ICD-10-CM | POA: Diagnosis not present

## 2020-02-15 DIAGNOSIS — R001 Bradycardia, unspecified: Secondary | ICD-10-CM | POA: Diagnosis not present

## 2020-02-15 DIAGNOSIS — N1832 Chronic kidney disease, stage 3b: Secondary | ICD-10-CM

## 2020-02-15 DIAGNOSIS — N183 Chronic kidney disease, stage 3 unspecified: Secondary | ICD-10-CM | POA: Diagnosis not present

## 2020-02-15 DIAGNOSIS — E785 Hyperlipidemia, unspecified: Secondary | ICD-10-CM | POA: Insufficient documentation

## 2020-02-15 DIAGNOSIS — Z79899 Other long term (current) drug therapy: Secondary | ICD-10-CM | POA: Diagnosis not present

## 2020-02-15 DIAGNOSIS — I25110 Atherosclerotic heart disease of native coronary artery with unstable angina pectoris: Secondary | ICD-10-CM | POA: Insufficient documentation

## 2020-02-15 DIAGNOSIS — R55 Syncope and collapse: Secondary | ICD-10-CM | POA: Diagnosis not present

## 2020-02-15 DIAGNOSIS — R05 Cough: Secondary | ICD-10-CM | POA: Diagnosis not present

## 2020-02-15 DIAGNOSIS — Z951 Presence of aortocoronary bypass graft: Secondary | ICD-10-CM | POA: Insufficient documentation

## 2020-02-15 DIAGNOSIS — I5032 Chronic diastolic (congestive) heart failure: Secondary | ICD-10-CM | POA: Diagnosis not present

## 2020-02-15 DIAGNOSIS — I130 Hypertensive heart and chronic kidney disease with heart failure and stage 1 through stage 4 chronic kidney disease, or unspecified chronic kidney disease: Secondary | ICD-10-CM | POA: Insufficient documentation

## 2020-02-15 DIAGNOSIS — I441 Atrioventricular block, second degree: Secondary | ICD-10-CM | POA: Diagnosis not present

## 2020-02-15 DIAGNOSIS — R404 Transient alteration of awareness: Secondary | ICD-10-CM | POA: Diagnosis not present

## 2020-02-15 DIAGNOSIS — Z7982 Long term (current) use of aspirin: Secondary | ICD-10-CM | POA: Diagnosis not present

## 2020-02-15 DIAGNOSIS — Z743 Need for continuous supervision: Secondary | ICD-10-CM | POA: Diagnosis not present

## 2020-02-15 DIAGNOSIS — I2510 Atherosclerotic heart disease of native coronary artery without angina pectoris: Secondary | ICD-10-CM | POA: Diagnosis present

## 2020-02-15 DIAGNOSIS — R42 Dizziness and giddiness: Secondary | ICD-10-CM | POA: Diagnosis not present

## 2020-02-15 LAB — CBC
HCT: 38.9 % — ABNORMAL LOW (ref 39.0–52.0)
Hemoglobin: 12.4 g/dL — ABNORMAL LOW (ref 13.0–17.0)
MCH: 30.4 pg (ref 26.0–34.0)
MCHC: 31.9 g/dL (ref 30.0–36.0)
MCV: 95.3 fL (ref 80.0–100.0)
Platelets: 170 10*3/uL (ref 150–400)
RBC: 4.08 MIL/uL — ABNORMAL LOW (ref 4.22–5.81)
RDW: 13.6 % (ref 11.5–15.5)
WBC: 4.3 10*3/uL (ref 4.0–10.5)
nRBC: 0 % (ref 0.0–0.2)

## 2020-02-15 LAB — COMPREHENSIVE METABOLIC PANEL
ALT: 18 U/L (ref 0–44)
AST: 21 U/L (ref 15–41)
Albumin: 4 g/dL (ref 3.5–5.0)
Alkaline Phosphatase: 64 U/L (ref 38–126)
Anion gap: 9 (ref 5–15)
BUN: 19 mg/dL (ref 8–23)
CO2: 26 mmol/L (ref 22–32)
Calcium: 8.9 mg/dL (ref 8.9–10.3)
Chloride: 104 mmol/L (ref 98–111)
Creatinine, Ser: 1.73 mg/dL — ABNORMAL HIGH (ref 0.61–1.24)
GFR calc Af Amer: 44 mL/min — ABNORMAL LOW (ref >60)
GFR calc non Af Amer: 38 mL/min — ABNORMAL LOW (ref >60)
Glucose, Bld: 102 mg/dL — ABNORMAL HIGH (ref 70–99)
Potassium: 4.5 mmol/L (ref 3.5–5.1)
Sodium: 139 mmol/L (ref 135–145)
Total Bilirubin: 0.9 mg/dL (ref 0.3–1.2)
Total Protein: 7 g/dL (ref 6.5–8.1)

## 2020-02-15 LAB — CBG MONITORING, ED
Glucose-Capillary: 109 mg/dL — ABNORMAL HIGH (ref 70–99)
Glucose-Capillary: 115 mg/dL — ABNORMAL HIGH (ref 70–99)

## 2020-02-15 LAB — SARS CORONAVIRUS 2 BY RT PCR (HOSPITAL ORDER, PERFORMED IN ~~LOC~~ HOSPITAL LAB): SARS Coronavirus 2: NEGATIVE

## 2020-02-15 LAB — GLUCOSE, CAPILLARY
Glucose-Capillary: 120 mg/dL — ABNORMAL HIGH (ref 70–99)
Glucose-Capillary: 196 mg/dL — ABNORMAL HIGH (ref 70–99)

## 2020-02-15 LAB — TROPONIN I (HIGH SENSITIVITY): Troponin I (High Sensitivity): 5 ng/L (ref <18)

## 2020-02-15 LAB — TSH: TSH: 4.638 u[IU]/mL — ABNORMAL HIGH (ref 0.350–4.500)

## 2020-02-15 MED ORDER — DM-DOXYLAMINE-ACETAMINOPHEN 15-6.25-325 MG/15ML PO LIQD: 30.0000 mL | Freq: Every evening | ORAL | Status: DC | PRN

## 2020-02-15 MED ORDER — HYDRALAZINE HCL 50 MG PO TABS
50.0000 mg | ORAL_TABLET | Freq: Every morning | ORAL | Status: DC
  Administered 2020-02-16: 50 mg via ORAL
  Filled 2020-02-15: qty 1

## 2020-02-15 MED ORDER — IRBESARTAN 300 MG PO TABS
300.0000 mg | ORAL_TABLET | Freq: Every day | ORAL | Status: DC
  Administered 2020-02-16: 300 mg via ORAL
  Filled 2020-02-15: qty 2
  Filled 2020-02-15: qty 1

## 2020-02-15 MED ORDER — SODIUM CHLORIDE 0.9% FLUSH
3.0000 mL | Freq: Two times a day (BID) | INTRAVENOUS | Status: DC
  Administered 2020-02-15: 3 mL via INTRAVENOUS

## 2020-02-15 MED ORDER — DM-PHENYLEPHRINE-ACETAMINOPHEN 10-5-325 MG/15ML PO LIQD: 30.0000 mL | ORAL | Status: DC | PRN

## 2020-02-15 MED ORDER — DEXTROMETHORPHAN POLISTIREX ER 30 MG/5ML PO SUER
15.0000 mg | Freq: Every evening | ORAL | Status: DC | PRN
  Filled 2020-02-15: qty 5

## 2020-02-15 MED ORDER — ASPIRIN EC 81 MG PO TBEC
81.0000 mg | DELAYED_RELEASE_TABLET | Freq: Every day | ORAL | Status: DC
  Administered 2020-02-16: 81 mg via ORAL
  Filled 2020-02-15: qty 1

## 2020-02-15 MED ORDER — ISOSORBIDE MONONITRATE ER 60 MG PO TB24
120.0000 mg | ORAL_TABLET | Freq: Every day | ORAL | Status: DC
  Administered 2020-02-15: 120 mg via ORAL
  Filled 2020-02-15: qty 2

## 2020-02-15 MED ORDER — SODIUM CHLORIDE 0.9 % IV SOLN: 250.0000 mL | INTRAVENOUS | Status: DC | PRN

## 2020-02-15 MED ORDER — SODIUM CHLORIDE 0.9% FLUSH: 3.0000 mL | INTRAVENOUS | Status: DC | PRN

## 2020-02-15 MED ORDER — ACETAMINOPHEN 325 MG PO TABS: 325.0000 mg | ORAL_TABLET | Freq: Every evening | ORAL | Status: DC | PRN

## 2020-02-15 MED ORDER — DEXTROMETHORPHAN POLISTIREX ER 30 MG/5ML PO SUER
15.0000 mg | ORAL | Status: DC | PRN
  Filled 2020-02-15: qty 5

## 2020-02-15 MED ORDER — ATORVASTATIN CALCIUM 80 MG PO TABS
80.0000 mg | ORAL_TABLET | Freq: Every day | ORAL | Status: DC
  Administered 2020-02-15: 80 mg via ORAL
  Filled 2020-02-15: qty 1

## 2020-02-15 MED ORDER — INSULIN ASPART 100 UNIT/ML ~~LOC~~ SOLN: 0.0000 [IU] | Freq: Three times a day (TID) | SUBCUTANEOUS | Status: DC

## 2020-02-15 MED ORDER — DOXYLAMINE SUCCINATE (SLEEP) 25 MG PO TABS
12.5000 mg | ORAL_TABLET | Freq: Every evening | ORAL | Status: DC | PRN
  Filled 2020-02-15: qty 1

## 2020-02-15 MED ORDER — HYDRALAZINE HCL 50 MG PO TABS: 100.0000 mg | ORAL_TABLET | Freq: Every evening | ORAL | Status: DC

## 2020-02-15 MED ORDER — HYDRALAZINE HCL 25 MG PO TABS: 50.0000 mg | ORAL_TABLET | ORAL | Status: DC

## 2020-02-15 MED ORDER — HEPARIN SODIUM (PORCINE) 5000 UNIT/ML IJ SOLN
5000.0000 [IU] | Freq: Three times a day (TID) | INTRAMUSCULAR | Status: DC
  Administered 2020-02-15 – 2020-02-16 (×3): 5000 [IU] via SUBCUTANEOUS
  Filled 2020-02-15 (×4): qty 1

## 2020-02-15 MED ORDER — GUAIFENESIN ER 600 MG PO TB12: 600.0000 mg | ORAL_TABLET | Freq: Two times a day (BID) | ORAL | Status: DC | PRN

## 2020-02-15 MED ORDER — ISOSORBIDE MONONITRATE ER 30 MG PO TB24: 60.0000 mg | ORAL_TABLET | Freq: Every day | ORAL | Status: DC

## 2020-02-15 MED ORDER — ACETAMINOPHEN 325 MG PO TABS: 650.0000 mg | ORAL_TABLET | ORAL | Status: DC | PRN

## 2020-02-15 MED ORDER — CHLORTHALIDONE 25 MG PO TABS
12.5000 mg | ORAL_TABLET | Freq: Every morning | ORAL | Status: DC
  Administered 2020-02-16: 12.5 mg via ORAL
  Filled 2020-02-15: qty 0.5

## 2020-02-15 NOTE — ED Notes (Signed)
External pacer pads applied

## 2020-02-15 NOTE — ED Provider Notes (Addendum)
[facility] EMERGENCY DEPARTMENT Provider Note   CSN: [id number] Arrival date & time: 02/15/20  1238     History Chief Complaint  Patient presents with  . Loss of Consciousness    Antonio Wells is a 74 y.o. male.  Patient with hx cad/cabg, presents with syncope. Pt notes acute onset lightheadedness, general weakness, this AM. Symptoms acute onset, mod-severe, episodic, now resolved. Had brief syncopal event - spouse assisted patient to floor. No trauma or injury. EMS notes hr in 70s, but then period when on monitor when hr was low, in 20s, with lightheadedness/faintness. Patient denies any hx dysrhythmia or prior syncope. No palpitations. Denies any current or recent chest pain or discomfort. Normal appetite. No nvd. No wt change. Compliant w home meds, no recent change. No fever or chills.   The history is provided by the patient and the EMS personnel.  Loss of Consciousness Associated symptoms: no chest pain, no confusion, no fever, no headaches, no palpitations, no shortness of breath and no vomiting        Past Medical History:  Diagnosis Date  . Arthritis    "knees" (07/11/2017)  . CKD (chronic kidney disease), stage III   . Coronary artery disease    a.  CABGx3 in 05/2017.  Marland Kitchen Gout   . Heart murmur   . High cholesterol   . Hypertension   . Mild aortic stenosis   . Mild mitral regurgitation   . OSA on CPAP   . Postoperative atrial fibrillation ([place]) 06/2017  . Sleep apnea   . Type II diabetes mellitus [facility])     Patient Active Problem List   Diagnosis Date Noted  . Hypertensive encephalopathy 10/19/2018  . Chronic kidney disease, stage 3 10/19/2018  . DM (diabetes mellitus), type 2 with renal complications ([place]) 41/28/7867  . Cough 03/17/2018  . Lower resp. tract infection 03/17/2018  . Sleep apnea with use of continuous positive airway pressure (CPAP) 01/04/2018  . Malnutrition of moderate degree 07/14/2017  . Thrush 07/11/2017  . FTT  (failure to thrive) in adult 07/11/2017  . 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 CAD (coronary artery disease) 05/25/2017  . Unstable angina ([place])   . Hypertensive heart disease 07/14/2015  . Lower extremity edema 07/14/2015  . Aortic stenosis 01/09/2015  . OSA on CPAP 12/12/2014  . Hypersomnia with sleep apnea 12/12/2014  . Obstructive sleep apnea (adult) (pediatric) 02/04/2014  . 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 Obesity 02/04/2014  . Mitral regurgitation and aortic stenosis 01/02/2014  . DOE (dyspnea on exertion) 11/26/2013  . Gout 08/12/2011  . Hypertension 08/12/2011  . High cholesterol 08/12/2011  . OSA (obstructive sleep apnea) 08/12/2011  . Diabetes type 2, uncontrolled ([place]) 08/12/2011    Past Surgical History:  Procedure Laterality Date  . CARDIAC CATHETERIZATION    . COLONOSCOPY  2008  . CORONARY ARTERY BYPASS GRAFT N/A 06/24/2017   Procedure: CORONARY ARTERY BYPASS GRAFTING (CABG) x 3 ; Using Left Internal Mammary Artery and Right Great Saphenous Leg Vein Harvested Endoscopically;  Surgeon: Gaye Pollack, MD;  Location: [place] OR;  Service: Open Heart Surgery;  Laterality: N/A;  . EYE SURGERY    . LEFT HEART CATH AND CORONARY ANGIOGRAPHY N/A 05/25/2017   Procedure: LEFT HEART CATH AND CORONARY ANGIOGRAPHY;  Surgeon: Wellington Hampshire, MD;  Location: [place] CV LAB;  Service: Cardiovascular;  Laterality: N/A;  . TEE WITHOUT CARDIOVERSION N/A 06/24/2017   Procedure: TRANSESOPHAGEAL ECHOCARDIOGRAM (TEE);  Surgeon: Gaye Pollack,  MD;  Location: [facility] OR;  Service: Open Heart Surgery;  Laterality: N/A;       Family History  Problem Relation Age of Onset  . Hypertension Mother   . Kidney disease Mother   . Diabetes Mother   . Hypertension Father   . Cancer Brother        lung  . Colon cancer Brother   . Stroke Brother   . Esophageal cancer Neg Hx   . Rectal cancer Neg Hx   . Stomach cancer Neg Hx     Social History   Tobacco Use  . Smoking status: Former Smoker     Packs/day: 2.00    Years: 15.00    Pack years: 30.00    Types: Cigarettes    Quit date: 07/26/1998    Years since quitting: 21.5  . Smokeless tobacco: Never Used  Vaping Use  . Vaping Use: Never used  Substance Use Topics  . Alcohol use: No    Alcohol/week: 0.0 standard drinks  . Drug use: No    Home Medications Prior to Admission medications   Medication Sig Start Date End Date Taking? Authorizing Provider  acetaminophen (TYLENOL) 325 MG tablet Take 650 mg by mouth every 6 (six) hours as needed for mild pain.    [provider]  aspirin EC 81 MG tablet Take 1 tablet (81 mg total) by mouth daily. 09/02/17   Dunn, Nedra Hai, PA-C  atorvastatin (LIPITOR) 80 MG tablet TAKE 1 TABLET BY MOUTH  DAILY AT 6 PM 12/01/19   [name], MD  carvedilol (COREG) 25 MG tablet Take 1 tablet (25 mg total) by mouth daily. 11/06/19   [name], MD  chlorthalidone (HYGROTON) 25 MG tablet Take 12.5 mg by mouth every morning.  09/10/19   [provider]  clotrimazole (LOTRIMIN) 1 % cream Apply 1 application topically 2 (two) times daily. Inside foreskin for 1 week. 11/23/19   [name], MD  colchicine 0.6 MG tablet TAKE 2 TABS ON ONSET OF GOUT FLARE THEN REPEAT IN 1 HOUR OF ACUTE GOUT ATTACK 07/06/19   [name], MD  glipiZIDE (GLUCOTROL) 5 MG tablet TAKE 1 TABLET BY MOUTH  DAILY BEFORE BREAKFAST 12/01/19   [name], MD  glucose blood test strip Use as instructed 01/21/20   [name], MD  hydrALAZINE (APRESOLINE) 50 MG tablet Take 1 tablet (50 mg total) by mouth in the AM, then take 2 tablets (100 mg total) by mouth in the PM, everyday. 10/30/19   [name], MD  irbesartan (AVAPRO) 300 MG tablet Take 300 mg by mouth daily.    [provider]  isosorbide mononitrate (IMDUR) 60 MG 24 hr tablet Take 1 tablet (60 mg total) by mouth at bedtime. 12/04/19   [name], MD  Lancets [facility] ULTRASOFT) lancets 1 each by Other route 1 day  or 1 dose for 1 dose. Use as instructed 01/30/20 01/31/20  [name], MD  metFORMIN (GLUCOPHAGE) 500 MG tablet TAKE 1 TABLET BY MOUTH  TWICE DAILY WITH A MEAL 12/01/19   [name], MD    Allergies    Penicillins, Lisinopril, Amiodarone, Amlodipine, Oxycodone, Zofran [ondansetron hcl], Cortisone, and Tramadol  Review of Systems   Review of Systems  Constitutional: Negative for fever.  HENT: Negative for sore throat.   Eyes: Negative for visual disturbance.  Respiratory: Negative for shortness of breath.   Cardiovascular: Positive for syncope. Negative for chest pain, palpitations  and leg swelling.  Gastrointestinal: Negative for abdominal pain, diarrhea and vomiting.  Genitourinary: Negative for flank pain.  Musculoskeletal: Negative for back pain and neck pain.  Skin: Negative for rash.  Neurological: Positive for syncope and light-headedness. Negative for speech difficulty and headaches.  Hematological: Does not bruise/bleed easily.  Psychiatric/Behavioral: Negative for confusion.    Physical Exam Updated Vital Signs BP (!) 149/74 (BP Location: Right Arm)   Pulse 71   Temp 98.2 F (36.8 C)   Resp 15   Ht 1.6 m (5\' 3" )   Wt 89.8 kg   SpO2 95%   BMI 35.07 kg/m   Physical Exam Vitals and nursing note reviewed.  Constitutional:      Appearance: Normal appearance. He is well-developed.  HENT:     Head: Atraumatic.     Nose: Nose normal.     Mouth/Throat:     Mouth: Mucous membranes are moist.     Pharynx: Oropharynx is clear.  Eyes:     General: No scleral icterus.    Conjunctiva/sclera: Conjunctivae normal.     Pupils: Pupils are equal, round, and reactive to light.  Neck:     Vascular: No carotid bruit.     Trachea: No tracheal deviation.     Comments: Thyroid not grossly enlarged or tender.  Cardiovascular:     Rate and Rhythm: Normal rate and regular rhythm.     Pulses: Normal pulses.     Heart sounds: Normal heart sounds. No murmur heard.  No  friction rub. No gallop.   Pulmonary:     Effort: Pulmonary effort is normal. No accessory muscle usage or respiratory distress.     Breath sounds: Normal breath sounds.  Abdominal:     General: Bowel sounds are normal. There is no distension.     Palpations: Abdomen is soft. There is no mass.     Tenderness: There is no abdominal tenderness. There is no guarding.  Genitourinary:    Comments: No cva tenderness. Musculoskeletal:        General: No swelling or tenderness.     Cervical back: Normal range of motion and neck supple. No rigidity.     Right lower leg: No edema.     Left lower leg: No edema.  Lymphadenopathy:     Cervical: No cervical adenopathy.  Skin:    General: Skin is warm and dry.     Findings: No rash.  Neurological:     Mental Status: He is alert.     Comments: Alert, speech clear. Motor/sens grossly intact bil.   Psychiatric:        Mood and Affect: Mood normal.     ED Results / Procedures / Treatments   Labs (all labs ordered are listed, but only abnormal results are displayed) Results for orders placed or performed during the hospital encounter of 02/15/20  CBC  Result Value Ref Range   WBC 4.3 4.0 - 10.5 K/uL   RBC 4.08 (L) 4.22 - 5.81 MIL/uL   Hemoglobin 12.4 (L) 13.0 - 17.0 g/dL   HCT 38.9 (L) 39 - 52 %   MCV 95.3 80.0 - 100.0 fL   MCH 30.4 26.0 - 34.0 pg   MCHC 31.9 30.0 - 36.0 g/dL   RDW 13.6 11.5 - 15.5 %   Platelets 170 150 - 400 K/uL   nRBC 0.0 0.0 - 0.2 %  Comprehensive metabolic panel  Result Value Ref Range   Sodium 139 135 - 145 mmol/L   Potassium  4.5 3.5 - 5.1 mmol/L   Chloride 104 98 - 111 mmol/L   CO2 26 22 - 32 mmol/L   Glucose, Bld 102 (H) 70 - 99 mg/dL   BUN 19 8 - 23 mg/dL   Creatinine, Ser 1.73 (H) 0.61 - 1.24 mg/dL   Calcium 8.9 8.9 - 10.3 mg/dL   Total Protein 7.0 6.5 - 8.1 g/dL   Albumin 4.0 3.5 - 5.0 g/dL   AST 21 15 - 41 U/L   ALT 18 0 - 44 U/L   Alkaline Phosphatase 64 38 - 126 U/L   Total Bilirubin 0.9 0.3 - 1.2  mg/dL   GFR calc non Af Amer 38 (L) >60 mL/min   GFR calc Af Amer 44 (L) >60 mL/min   Anion gap 9 5 - 15  CBG monitoring, ED  Result Value Ref Range   Glucose-Capillary 109 (H) 70 - 99 mg/dL   Comment 1 Notify RN    Comment 2 Document in Chart   Troponin I (High Sensitivity)  Result Value Ref Range   Troponin I (High Sensitivity) 5 <18 ng/L   DG Chest Port 1 View  Result Date: 02/15/2020 CLINICAL DATA:  Syncope, cough and congestion. EXAM: PORTABLE CHEST 1 VIEW COMPARISON:  07/27/2017 FINDINGS: UPPER limits normal heart size and CABG changes again noted. Mildly elevated RIGHT hemidiaphragm again visualized. This is a mildly low volume film. There is no evidence of focal airspace disease, pulmonary edema, suspicious pulmonary nodule/mass, pleural effusion, or pneumothorax. No acute bony abnormalities are identified. IMPRESSION: No active disease. Electronically Signed   By: Margarette Canada M.D.   On: 02/15/2020 13:19    EKG EKG Interpretation  Date/Time:  Friday February 15 2020 12:39:01 EDT Ventricular Rate:  70 PR Interval:    QRS Duration: 90 QT Interval:  395 QTC Calculation: 427 R Axis:   -20 Text Interpretation: Sinus rhythm Probable left ventricular hypertrophy No significant change since last tracing Confirmed by Lajean Saver [phone] on 02/15/2020 2:33:35 PM   Radiology DG Chest Port 1 View  Result Date: 02/15/2020 CLINICAL DATA:  Syncope, cough and congestion. EXAM: PORTABLE CHEST 1 VIEW COMPARISON:  07/27/2017 FINDINGS: UPPER limits normal heart size and CABG changes again noted. Mildly elevated RIGHT hemidiaphragm again visualized. This is a mildly low volume film. There is no evidence of focal airspace disease, pulmonary edema, suspicious pulmonary nodule/mass, pleural effusion, or pneumothorax. No acute bony abnormalities are identified. IMPRESSION: No active disease. Electronically Signed   By: Margarette Canada M.D.   On: 02/15/2020 13:19    Procedures Procedures (including  critical care time)  Medications Ordered in ED Medications - No data to display       ED Course  I have reviewed the triage vital signs and the nursing notes.  Pertinent labs & imaging results that were available during my care of the patient were reviewed by me and considered in my medical decision making (see chart for details).    MDM Rules/Calculators/A&P                         Iv ns. Continuous pulse ox and monitor. Stat labs. Ecg.   Reviewed nursing notes and prior charts for additional history.   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 External pacer pads applied. Cardiology consulted.   Discussed pt with cardiology, Trish, they will see in ED/admit.  Initial labs reviewed/interpreted by me -  chem normal.  CXR reviewed/interpreted by me - no pna.  Additional labs reviewed/interpreted by me - trop normal.   Recheck pt, no new c/o, no cp or sob, nsr on monitor.      Final Clinical Impression(s) / ED Diagnoses Final diagnoses:  None    Rx / DC Orders ED Discharge Orders    None           Lajean Saver, MD 02/15/20 [phone]

## 2020-02-15 NOTE — ED Notes (Signed)
Call from lab saying troponin was hemolyzed.will recollect.

## 2020-02-15 NOTE — ED Notes (Signed)
Troponin recollected and sent to Main Lab.

## 2020-02-15 NOTE — H&P (Addendum)
Cardiology Admission History and Physical:   Patient ID: Antonio Wells MRN: [id number]; DOB: Aug 03, 1945   Admission date: 02/15/2020  Primary Care Provider: [name], MD [facility] Cardiologist: Ena Dawley, MD  [place] Electrophysiologist:  None  Chief Complaint:  syncope  Patient Profile:   Antonio Wells is a 74 y.o. male with CAD with unstable angina s/p recent CABGx3 in 05/2017 with post-op atrial fib, HTN with occasional low readings, HLD, DM, mild AS/MR, CKD stage III, sleep apnea, chronic diastolic CHF  History of Present Illness:   He has a history of CAD uncovered by cardiac CT leading to CABGx3 05/2017 with LIMA-LAD and sequential SVG to OM1/OM3. He did have post-op atrial fib treated with amiodarone which he did not tolerate well due to nausea leading to weight loss and dehydration with AKI. 2D echo 06/2018 showed EF 60-65%, grade 1 DD, mild AS, mild MR, mild LAE. In 09/2018 he was admitted for dizziness, word-finding difficulty and hand parasthesias. He ruled out for stroke by CT and MRI. Symptoms were felt due to hypertensive encephalopathy. He was doing well at last OV 10/2019; medications adjusted for intermittent low BP readings. The day before yesterday he developed some nasal congestion and mild cough but no fevers or chills. He had taken OTC Tylenol and Nyquil. No sick contacts, fully vaccinated in February. This morning he ate breakfast then began feeling nauseated. He checked his blood sugar and it was normal. He then had an episode of abrupt syncope while standing up. His knees buckled but his wife was able to catch him. His wife felt like it was a long time that he was out but estimates it was about a minute. He had a blank stare and did not hit his head. He had no b/b incontinence and came back to normal quickly upon awakening. EMS was called. While on the monitor with EMS, he had another episode of nausea, vomiting and profound diaphoresis  associated with HR in the 20s with what appears to have transient high grade heart block - per review with EP, felt more likely high grade 2nd degree AVB. This resolved without intervention and on telemetry he is in NSR. He denies any CP or SOB. Labs show Hgb 12.4 c/w prior, CMET and troponin, current VSS, BP 149/74. He is on carvedilol 25mg  but once daily. He has chronic DOE with activities like taking trash out or going up steps but no recent change in this.   Past Medical History:  Diagnosis Date  . Arthritis    "knees" (07/11/2017)  . CKD (chronic kidney disease), stage III   . Coronary artery disease    a.  CABGx3 in 05/2017.  Marland Kitchen Gout   . Heart murmur   . High cholesterol   . Hypertension   . Mild aortic stenosis   . Mild mitral regurgitation   . OSA on CPAP   . Postoperative atrial fibrillation ([place]) 06/2017  . Sleep apnea   . Type II diabetes mellitus ([place])     Past Surgical History:  Procedure Laterality Date  . CARDIAC CATHETERIZATION    . COLONOSCOPY  2008  . CORONARY ARTERY BYPASS GRAFT N/A 06/24/2017   Procedure: CORONARY ARTERY BYPASS GRAFTING (CABG) x 3 ; Using Left Internal Mammary Artery and Right Great Saphenous Leg Vein Harvested Endoscopically;  Surgeon: Gaye Pollack, MD;  Location: [facility];  Service: Open Heart Surgery;  Laterality: N/A;  . EYE SURGERY    . LEFT HEART CATH  AND CORONARY ANGIOGRAPHY N/A 05/25/2017   Procedure: LEFT HEART CATH AND CORONARY ANGIOGRAPHY;  Surgeon: Wellington Hampshire, MD;  Location: [facility];  Service: Cardiovascular;  Laterality: N/A;  . TEE WITHOUT CARDIOVERSION N/A 06/24/2017   Procedure: TRANSESOPHAGEAL ECHOCARDIOGRAM (TEE);  Surgeon: Gaye Pollack, MD;  Location: [facility];  Service: Open Heart Surgery;  Laterality: N/A;     Medications Prior to Admission: Prior to Admission medications   Medication Sig Start Date End Date Taking? Authorizing Provider  acetaminophen (TYLENOL) 325 MG tablet Take 650 mg by mouth every 6  (six) hours as needed for mild pain.   Yes [provider]  aspirin EC 81 MG tablet Take 1 tablet (81 mg total) by mouth daily. 09/02/17  Yes Dunn, Dayna N, PA-C  atorvastatin (LIPITOR) 80 MG tablet TAKE 1 TABLET BY MOUTH  DAILY AT 6 PM Patient taking differently: Take 80 mg by mouth daily at 6 PM.  12/01/19  Yes [name], MD  carvedilol (COREG) 25 MG tablet Take 1 tablet (25 mg total) by mouth daily. 11/06/19  Yes [name], MD  chlorthalidone (HYGROTON) 25 MG tablet Take 12.5 mg by mouth every morning.  09/10/19  Yes [provider]  glipiZIDE (GLUCOTROL) 5 MG tablet TAKE 1 TABLET BY MOUTH  DAILY BEFORE BREAKFAST Patient taking differently: Take 5 mg by mouth daily before breakfast.  12/01/19  Yes [name], MD  hydrALAZINE (APRESOLINE) 50 MG tablet Take 1 tablet (50 mg total) by mouth in the AM, then take 2 tablets (100 mg total) by mouth in the PM, everyday. 10/30/19  Yes [name], MD  irbesartan (AVAPRO) 300 MG tablet Take 300 mg by mouth daily.   Yes [provider]  isosorbide mononitrate (IMDUR) 60 MG 24 hr tablet Take 1 tablet (60 mg total) by mouth at bedtime. 12/04/19  Yes [name], MD  metFORMIN (GLUCOPHAGE) 500 MG tablet TAKE 1 TABLET BY MOUTH  TWICE DAILY WITH A MEAL Patient taking differently: Take 500 mg by mouth 2 (two) times daily with a meal.  12/01/19  Yes [name], MD  clotrimazole (LOTRIMIN) 1 % cream Apply 1 application topically 2 (two) times daily. Inside foreskin for 1 week. Patient not taking: Reported on 02/15/2020 11/23/19   [name], MD  colchicine 0.6 MG tablet TAKE 2 TABS ON ONSET OF GOUT FLARE THEN REPEAT IN 1 HOUR OF ACUTE GOUT ATTACK 07/06/19   [name], MD  glucose blood test strip Use as instructed 01/21/20   [name], MD  Lancets [facility] ULTRASOFT) lancets 1 each by Other route 1 day or 1 dose for 1 dose. Use as instructed 01/30/20 01/31/20  [name], MD      Allergies:    Allergies  Allergen Reactions  . Penicillins Other (See Comments)    Has patient had a PCN reaction causing immediate rash, facial/tongue/throat swelling, SOB or lightheadedness with hypotension: No Has patient had a PCN reaction causing severe rash involving mucus membranes or skin necrosis: No Has patient had a PCN reaction that required hospitalization: No Has patient had a PCN reaction occurring within the last 10 years: No If all of the above answers are "NO", then may proceed with Cephalosporin use.   Passed out ? SYNCOPE ?  Marland Kitchen Lisinopril Cough  . Amiodarone Nausea And Vomiting  . Amlodipine Other (See Comments)    Causes dizzines  . Oxycodone Other (See Comments)  hallucinations  . Zofran [Ondansetron Hcl] Other (See Comments)    Has an interaction with another medication pt takes   . Cortisone Nausea And Vomiting and Other (See Comments)    HICCUPS  . Tramadol Nausea And Vomiting and Other (See Comments)    HICCUPS    Social History:   Social History   Socioeconomic History  . Marital status: Married    Spouse name: [name]  . Number of children: 3  . Years of education: 46  . Highest education level: Not on file  Occupational History  . Occupation: Musician  Tobacco Use  . Smoking status: Former Smoker    Packs/day: 2.00    Years: 15.00    Pack years: 30.00    Types: Cigarettes    Quit date: 07/26/1998    Years since quitting: 21.5  . Smokeless tobacco: Never Used  Vaping Use  . Vaping Use: Never used  Substance and Sexual Activity  . Alcohol use: No    Alcohol/week: 0.0 standard drinks  . Drug use: No  . Sexual activity: Yes  Other Topics Concern  . Not on file  Social History Narrative         Social Determinants of Health   Financial Resource Strain:   . Difficulty of Paying Living Expenses:   Food Insecurity:   . Worried About Charity fundraiser in the Last Year:   . Arboriculturist in the Last Year:   Transportation  Needs:   . Film/video editor (Medical):   Marland Kitchen Lack of Transportation (Non-Medical):   Physical Activity:   . Days of Exercise per Week:   . Minutes of Exercise per Session:   Stress:   . Feeling of Stress :   Social Connections:   . Frequency of Communication with Friends and Family:   . Frequency of Social Gatherings with Friends and Family:   . Attends Religious Services:   . Active Member of Clubs or Organizations:   . Attends Archivist Meetings:   Marland Kitchen Marital Status:   Intimate Partner Violence:   . Fear of Current or Ex-Partner:   . Emotionally Abused:   Marland Kitchen Physically Abused:   . Sexually Abused:     Family History:   The patient's family history includes Cancer in his brother; Colon cancer in his brother; Diabetes in his mother; Hypertension in his father and mother; Kidney disease in his mother; Stroke in his brother. There is no history of Esophageal cancer, Rectal cancer, or Stomach cancer.    ROS:  Please see the history of present illness. All other ROS reviewed and negative.     Physical Exam/Data:   Vitals:   02/15/20 1238 02/15/20 1251 02/15/20 1252  BP:   (!) 149/74  Pulse: 69  71  Resp: 15    Temp: 98.2 F (36.8 C)  98.2 F (36.8 C)  TempSrc: Oral    SpO2: 96% 95% 95%  Weight: 89.8 kg    Height: 5\' 3"  (1.6 m)     No intake or output data in the 24 hours ending 02/15/20 1428 Last 3 Weights 02/15/2020 11/23/2019 10/30/2019  Weight (lbs) 198 lb 198 lb 198 lb 9.6 oz  Weight (kg) 89.812 kg 89.812 kg 90.084 kg     Body mass index is 35.07 kg/m.  General: Well developed, well nourished AAM in no acute distress. Head: Normocephalic, atraumatic, sclera non-icteric, no xanthomas, nares are without discharge. Mild nasally voice. Neck: Negative for carotid bruits.  JVP not elevated. Lungs: Clear bilaterally to auscultation without wheezes, rales, or rhonchi. Breathing is unlabored. Heart: RRR S1 S2 without murmurs, rubs, or gallops.  Abdomen: Soft,  non-tender, non-distended with normoactive bowel sounds. No rebound/guarding. Extremities: No clubbing or cyanosis. No edema. Distal pedal pulses are 2+ and equal bilaterally. Neuro: Alert and oriented X 3. Moves all extremities spontaneously. Psych:  Responds to questions appropriately with a normal affect.   EKG:  The ECG that was done today was personally reviewed and demonstrates NSR 70bpm, probable LAE, TWI III, probable LVH, no acute STT changes Also see above re: EMS EKG  Relevant CV Studies: 2D echo 06/2018 - Left ventricle: The cavity size was normal. There was mild  concentric hypertrophy. Systolic function was normal. The  estimated ejection fraction was in the range of 60% to 65%. Wall  motion was normal; there were no regional wall motion  abnormalities. There was an increased relative contribution of  atrial contraction to ventricular filling. Doppler parameters are  consistent with abnormal left ventricular relaxation (grade 1  diastolic dysfunction).  - Aortic valve: Severely calcified annulus. Trileaflet; moderately  thickened, moderately calcified leaflets. There was mild  stenosis. Mean gradient (S): 14 mm Hg.  - Mitral valve: Calcified annulus. Mild diffuse thickening and  calcification. There was mild regurgitation.  - Left atrium: The atrium was mildly dilated.  - Pulmonic valve: There was mild regurgitation.   Laboratory Data:  High Sensitivity Troponin:  No results for input(s): TROPONINIHS in the last 720 hours.    ChemistryNo results for input(s): NA, K, CL, CO2, GLUCOSE, BUN, CREATININE, CALCIUM, GFRNONAA, GFRAA, ANIONGAP in the last 168 hours.  No results for input(s): PROT, ALBUMIN, AST, ALT, ALKPHOS, BILITOT in the last 168 hours. Hematology Recent Labs  Lab 02/15/20 1259  WBC 4.3  RBC 4.08*  HGB 12.4*  HCT 38.9*  MCV 95.3  MCH 30.4  MCHC 31.9  RDW 13.6  PLT 170   BNPNo results for input(s): BNP, PROBNP in the last 168  hours.  DDimer No results for input(s): DDIMER in the last 168 hours.   Radiology/Studies:  DG Chest Port 1 View  Result Date: 02/15/2020 CLINICAL DATA:  Syncope, cough and congestion. EXAM: PORTABLE CHEST 1 VIEW COMPARISON:  07/27/2017 FINDINGS: UPPER limits normal heart size and CABG changes again noted. Mildly elevated RIGHT hemidiaphragm again visualized. This is a mildly low volume film. There is no evidence of focal airspace disease, pulmonary edema, suspicious pulmonary nodule/mass, pleural effusion, or pneumothorax. No acute bony abnormalities are identified. IMPRESSION: No active disease. Electronically Signed   By: Margarette Canada M.D.   On: 02/15/2020 13:19    Assessment and Plan:   1. Syncope with documented episode of high grade 2nd degree AV block by EMS - hold carvedilol - monitor on telemetry - keep pacer pads on patient and Zoll at bedside - obtain echocardiogram - if bradycardia recurs in the next 48 hours, will need to consider temp wire, and if this recurs outside of washout period will need EP consultation for PPM  2. CAD s/p CABGx3 - no recent angina and troponin reassuring - continue ASA, statin, Imdur - hold beta blocker  3. Mild nasal congestion/cough, consistent with mild URI - check Covid swab although no other clinical features concerning for such - supportive care  4. Chronic diastolic CHF - appears euvolemic - check echocardiogram  5. H/o post-op atrial fibrillation - no known clinical recurrence thus far  6. Essential HTN - will accept  a higher level of blood pressure during this time due to bradycardia - follow with holding carvedilol  7. DM2 - hold oral agents and add SSI  8. CKD stage III - Cr 1.73, c/w known baseline of 1.5-2 - no change in meds otherwise  Severity of Illness: The appropriate patient status for this patient is INPATIENT. Inpatient status is judged to be reasonable and necessary in order to provide the required intensity of  service to ensure the patient's safety. The patient's presenting symptoms, physical exam findings, and initial radiographic and laboratory data in the context of their chronic comorbidities is felt to place them at high risk for further clinical deterioration. Furthermore, it is not anticipated that the patient will be medically stable for discharge from the hospital within 2 midnights of admission. The following factors support the patient status of inpatient.   " The patient's presenting symptoms include syncope. " The worrisome physical exam findings include sternal scar c/w known CABG " The initial radiographic and laboratory data are worrisome because of abnormal EKG and high grade heart block. " The chronic co-morbidities include CAD, chronic diastolic CHF, HTN, HLD  * I certify that at the point of admission it is my clinical judgment that the patient will require inpatient hospital care spanning beyond 2 midnights from the point of admission due to high intensity of service, high risk for further deterioration and high frequency of surveillance required.*   For questions or updates, please contact [place] Please consult [email or web address] for contact info under    Signed, Charlie Pitter, PA-C  02/15/2020 2:28 PM   The patient was seen, examined and discussed with Melina Copa, PA-C and I agree with the above.   74 y.o. male with CAD with unstable angina s/p recent CABGx3 in 05/2017 with post-op atrial fib, HTN with occasional low readings, HLD, DM, mild AS/MR, CKD stage III, sleep apnea, chronic diastolic CHF, CAD uncovered by cardiac CT leading to CABGx3 05/2017 with LIMA-LAD and sequential SVG to OM1/OM3. He did have post-op atrial fib treated with amiodarone which he did not tolerate well due to nausea leading to weight loss and dehydration with AKI. 2D echo 06/2018 showed EF 60-65%, grade 1 DD, mild AS, mild MR, mild LAE. In 09/2018 he was admitted for dizziness, word-finding difficulty and  hand parasthesias. He ruled out for stroke by CT and MRI. Symptoms were felt due to hypertensive encephalopathy.  He has had difficult to control hypertension with intolerance of multiple hypertensive leading to weakness and dizziness. He has been feeling sick for last couple days with nasal congestion, no fever chills, when he woke up this morning he felt profoundly dizzy after drinking tea and having a light breakfast, afterwards he vomited twice and eventually passed out. It was a witnessed syncope by his wife, he did not hit his head, he did not have any seizure-like movements, no involuntary urination. EMS was called who found him in high degree second heart block, that has resolved by the time he arrived to the hospital. He denies any chest pain or shortness of breath today or in the last few weeks.  On physical exam his vitals are stable, his telemetry shows sinus rhythm with ventricular rates in 70s and 80s, his blood pressure is in 140s to 150s, he is not in acute distress he states he still feels slightly dizzy, he has no JVDs, 3 out of 6 systolic murmur, lungs are clear, he has no lower extremity edema, his legs are  warm with strong peripheral pulses in dorsalis pedis and posterior tibialis.  His labs shows normal electrolytes, creatinine 1.73 from baseline 1.5, normal hemoglobin in platelets, his hemoglobin A1c was 6.8% in April. His TSH for today is pending, troponin is 5.  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 I would hold Lasix given acute on  chronic kidney failure and monitor creatinine.  Ena Dawley, MD 02/15/2020

## 2020-02-15 NOTE — ED Notes (Signed)
Portable xray at bedside.

## 2020-02-15 NOTE — ED Triage Notes (Addendum)
Per EMS pt had a syncope episode shortly after taking home medications while at home. Wife assisted to the floor. En route pt had a abnormal HR of 20bpm and an abnormal EKG. Pt C/O congestion and cough for 2 days. Pt vitals stable at time of triage and does not seem to be in distress.

## 2020-02-16 ENCOUNTER — Other Ambulatory Visit: Payer: Self-pay | Admitting: Cardiology

## 2020-02-16 ENCOUNTER — Inpatient Hospital Stay (HOSPITAL_BASED_OUTPATIENT_CLINIC_OR_DEPARTMENT_OTHER): Payer: Medicare Other

## 2020-02-16 DIAGNOSIS — R001 Bradycardia, unspecified: Secondary | ICD-10-CM | POA: Diagnosis not present

## 2020-02-16 DIAGNOSIS — I25810 Atherosclerosis of coronary artery bypass graft(s) without angina pectoris: Secondary | ICD-10-CM | POA: Diagnosis not present

## 2020-02-16 DIAGNOSIS — I350 Nonrheumatic aortic (valve) stenosis: Secondary | ICD-10-CM

## 2020-02-16 DIAGNOSIS — E1129 Type 2 diabetes mellitus with other diabetic kidney complication: Secondary | ICD-10-CM

## 2020-02-16 DIAGNOSIS — R55 Syncope and collapse: Secondary | ICD-10-CM

## 2020-02-16 DIAGNOSIS — I340 Nonrheumatic mitral (valve) insufficiency: Secondary | ICD-10-CM

## 2020-02-16 DIAGNOSIS — N1832 Chronic kidney disease, stage 3b: Secondary | ICD-10-CM | POA: Diagnosis not present

## 2020-02-16 LAB — CBC
HCT: 35.1 % — ABNORMAL LOW (ref 39.0–52.0)
Hemoglobin: 11.3 g/dL — ABNORMAL LOW (ref 13.0–17.0)
MCH: 30.4 pg (ref 26.0–34.0)
MCHC: 32.2 g/dL (ref 30.0–36.0)
MCV: 94.4 fL (ref 80.0–100.0)
Platelets: 166 10*3/uL (ref 150–400)
RBC: 3.72 MIL/uL — ABNORMAL LOW (ref 4.22–5.81)
RDW: 13.6 % (ref 11.5–15.5)
WBC: 4.3 10*3/uL (ref 4.0–10.5)
nRBC: 0 % (ref 0.0–0.2)

## 2020-02-16 LAB — ECHOCARDIOGRAM COMPLETE
AR max vel: 1.2 cm2
AV Area VTI: 1.44 cm2
AV Area mean vel: 1.11 cm2
AV Mean grad: 15 mmHg
AV Peak grad: 25.5 mmHg
Ao pk vel: 2.52 m/s
Area-P 1/2: 3.03 cm2
Height: 63 in
S' Lateral: 2.7 cm
Weight: 2979.2 oz

## 2020-02-16 LAB — BASIC METABOLIC PANEL
Anion gap: 10 (ref 5–15)
BUN: 22 mg/dL (ref 8–23)
CO2: 24 mmol/L (ref 22–32)
Calcium: 8.7 mg/dL — ABNORMAL LOW (ref 8.9–10.3)
Chloride: 106 mmol/L (ref 98–111)
Creatinine, Ser: 1.66 mg/dL — ABNORMAL HIGH (ref 0.61–1.24)
GFR calc Af Amer: 47 mL/min — ABNORMAL LOW (ref >60)
GFR calc non Af Amer: 40 mL/min — ABNORMAL LOW (ref >60)
Glucose, Bld: 109 mg/dL — ABNORMAL HIGH (ref 70–99)
Potassium: 3.8 mmol/L (ref 3.5–5.1)
Sodium: 140 mmol/L (ref 135–145)

## 2020-02-16 LAB — T4, FREE: Free T4: 1.07 ng/dL (ref 0.61–1.12)

## 2020-02-16 LAB — GLUCOSE, CAPILLARY
Glucose-Capillary: 99 mg/dL (ref 70–99)
Glucose-Capillary: 150 mg/dL — ABNORMAL HIGH (ref 70–99)

## 2020-02-16 NOTE — Progress Notes (Signed)
  Echocardiogram 2D Echocardiogram has been performed.  Antonio Wells 02/16/2020, 2:44 PM

## 2020-02-16 NOTE — Discharge Summary (Signed)
Discharge Summary    Patient ID: Antonio Wells,  MRN: [id number], DOB/AGE: [birth year] 74 y.o.  Admit date: 02/15/2020 Discharge date: 02/16/2020  Primary Care Provider: [name] Primary Cardiologist: Ena Dawley, MD  Discharge Diagnoses    Principal Problem:   Syncope Active Problems:   Hypertension   CAD (coronary artery disease)   Chronic kidney disease, stage 3   DM (diabetes mellitus), type 2 with renal complications (HCC)   Bradycardia   Allergies Allergies  Allergen Reactions  . Penicillins Other (See Comments)    Has patient had a PCN reaction causing immediate rash, facial/tongue/throat swelling, SOB or lightheadedness with hypotension: No Has patient had a PCN reaction causing severe rash involving mucus membranes or skin necrosis: No Has patient had a PCN reaction that required hospitalization: No Has patient had a PCN reaction occurring within the last 10 years: No If all of the above answers are "NO", then may proceed with Cephalosporin use.   Passed out ? SYNCOPE ?  Marland Kitchen Lisinopril Cough  . Amiodarone Nausea And Vomiting  . Amlodipine Other (See Comments)    Causes dizzines  . Oxycodone Other (See Comments)    hallucinations  . Zofran [Ondansetron Hcl] Other (See Comments)    Has an interaction with another medication pt takes   . Cortisone Nausea And Vomiting and Other (See Comments)    HICCUPS  . Tramadol Nausea And Vomiting and Other (See Comments)    HICCUPS    Diagnostic Studies/Procedures    Echocardiogram 02/16/20: 1. Left ventricular ejection fraction, by estimation, is 60 to 65%. The  left ventricle has normal function. The left ventricle has no regional  wall motion abnormalities. There is mild concentric left ventricular  hypertrophy. Left ventricular diastolic  parameters were normal.  2. Right ventricular systolic function is normal. The right ventricular  size is normal. There is mildly elevated pulmonary artery  systolic  pressure.  3. The mitral valve is normal in structure. Mild mitral valve  regurgitation. No evidence of mitral stenosis.  4. The aortic valve is tricuspid. Aortic valve regurgitation is not  visualized. Mild aortic valve stenosis.  5. The inferior vena cava is normal in size with greater than 50%  respiratory variability, suggesting right atrial pressure of 3 mmHg.  _____________   History of Present Illness     Antonio Wells is a 74 y.o. male with CAD with unstable angina s/p recent CABGx3 in 05/2017 with post-op atrial fib, HTN with occasional low readings, HLD, DM, mild AS/MR, CKD stage III, sleep apnea, chronic diastolic CHF.  He has a history of CAD uncovered by cardiac CT leading to CABGx3 05/2017 with LIMA-LAD and sequential SVG to OM1/OM3. He did have post-op atrial fib treated with amiodarone which he did not tolerate well due to nausea leading to weight loss and dehydration with AKI. 2D echo 06/2018 showed EF 60-65%, grade 1 DD, mild AS, mild MR, mild LAE. In 09/2018 he was admitted for dizziness, word-finding difficulty and hand parasthesias. He ruled out for stroke by CT and MRI. Symptoms were felt due to hypertensive encephalopathy. He was doing well at last OV 10/2019; medications adjusted for intermittent low BP readings. The day before yesterday he developed some nasal congestion and mild cough but no fevers or chills. He had taken OTC Tylenol and Nyquil. No sick contacts, fully vaccinated in February. This morning he ate breakfast then began feeling nauseated. He checked his blood sugar and it was normal. He  then had an episode of abrupt syncope while standing up. His knees buckled but his wife was able to catch him. His wife felt like it was a long time that he was out but estimates it was about a minute. He had a blank stare and did not hit his head. He had no b/b incontinence and came back to normal quickly upon awakening. EMS was called. While on the monitor with  EMS, he had another episode of nausea, vomiting and profound diaphoresis associated with HR in the 20s with what appears to have transient high grade heart block - per review with EP, felt more likely high grade 2nd degree AVB. This resolved without intervention and on telemetry he is in NSR. He denies any CP or SOB. Labs show Hgb 12.4 c/w prior, CMET and troponin, current VSS, BP 149/74. He is on carvedilol 25mg  but once daily. He has chronic DOE with activities like taking trash out or going up steps but no recent change in this.  Hospital Course     Consultants: None   1. Syncope with documented episode of high grade 2nd degree AV block by EMS: No evidence of high grade AV block on telemetry this admission. Home carvedilol held. Echo showed EF 60-65%, mild concentric LVH, normal LV diastolic function, mildly elevated PA systolic pressure, mild MR, and mild AS. He was very anxious to go home. Dr. Harrell Gave reviewed red flag warning signs with the patient. Order placed for 7 day live telemetry monitoring outpatient. Message sent to scheduling to arrange outpatient follow-up.  - Await zio patch monitoring - Continue to hold carvedilol  2. CAD s/p CABGx3: no recent angina and troponin reassuring - Continue ASA, statin, Imdur - Continue to hold beta blocker as above - Consider SGLT2i as below  3. Mild AS: prominent murmur noted on exam, though echo with continued mild AS this admission. - Continue outpatient monitoring.   4. Chronic diastolic CHF: appears euvolemic - Continue chlorthalidone  5. Essential HTN: BP stable - Continue irbesartan 300 mg daily, isosorbide 120 mg daily, hydralazine 50 mg in the AM and 100 mg in the PM, and chlorthalidone 12.5 mg daily  6. DM2: maintained on ISS this admission.  - Continue home metformin - Recommend SLGT2i given CAD, CKD, diabetes, diastolic heart failure. Would consider either empagliflozin 10 mg or dapagliflozin 10 mg daily as an  outpatient.  7. CKD stage III: Cr 1.66, c/w known baseline of 1.5-2 - no change in meds otherwise _____________  Discharge Vitals Blood pressure (!) 144/79, pulse 97, temperature 98.3 F (36.8 C), temperature source Oral, resp. rate 18, height 5\' 3"  (1.6 m), weight 84.5 kg, SpO2 98 %.  Filed Weights   02/15/20 1238 02/16/20 0331  Weight: 89.8 kg 84.5 kg    Labs & Radiologic Studies    CBC Recent Labs    02/15/20 1259 02/16/20 0609  WBC 4.3 4.3  HGB 12.4* 11.3*  HCT 38.9* 35.1*  MCV 95.3 94.4  PLT 170 381   Basic Metabolic Panel Recent Labs    02/15/20 1355 02/16/20 0609  NA 139 140  K 4.5 3.8  CL 104 106  CO2 26 24  GLUCOSE 102* 109*  BUN 19 22  CREATININE 1.73* 1.66*  CALCIUM 8.9 8.7*   Liver Function Tests Recent Labs    02/15/20 1355  AST 21  ALT 18  ALKPHOS 64  BILITOT 0.9  PROT 7.0  ALBUMIN 4.0   No results for input(s): LIPASE, AMYLASE in the  last 72 hours. Cardiac Enzymes No results for input(s): CKTOTAL, CKMB, CKMBINDEX, TROPONINI in the last 72 hours. BNP Invalid input(s): POCBNP D-Dimer No results for input(s): DDIMER in the last 72 hours. Hemoglobin A1C No results for input(s): HGBA1C in the last 72 hours. Fasting Lipid Panel No results for input(s): CHOL, HDL, LDLCALC, TRIG, CHOLHDL, LDLDIRECT in the last 72 hours. Thyroid Function Tests Recent Labs    02/15/20 1445  TSH 4.638*   _____________  DG Chest Port 1 View  Result Date: 02/15/2020 CLINICAL DATA:  Syncope, cough and congestion. EXAM: PORTABLE CHEST 1 VIEW COMPARISON:  07/27/2017 FINDINGS: UPPER limits normal heart size and CABG changes again noted. Mildly elevated RIGHT hemidiaphragm again visualized. This is a mildly low volume film. There is no evidence of focal airspace disease, pulmonary edema, suspicious pulmonary nodule/mass, pleural effusion, or pneumothorax. No acute bony abnormalities are identified. IMPRESSION: No active disease. Electronically Signed   By: Margarette Canada M.D.   On: 02/15/2020 13:19   ECHOCARDIOGRAM COMPLETE  Result Date: 02/16/2020    ECHOCARDIOGRAM REPORT   Patient Name:   Antonio Wells Date of Exam: 02/16/2020 Medical Rec #:  [id number]         Height:       63.0 in Accession #:    [id number]        Weight:       186.2 lb Date of Birth:  01/16/1946         BSA:          1.876 m Patient Age:    84 years          BP:           144/79 mmHg Patient Gender: M                 HR:           76 bpm. Exam Location:  Inpatient Procedure: 2D Echo, Cardiac Doppler and Color Doppler Indications:    R55 Syncope; Heart Block [id number]  History:        Patient has prior history of Echocardiogram examinations, most                 recent 06/27/2018. CAD, Aortic Valve Disease, Arrythmias:Atrial                 Fibrillation, Signs/Symptoms:Murmur; Risk Factors:Hypertension,                 Diabetes, Dyslipidemia and Sleep Apnea. CKD.  Sonographer:    Jonelle Sidle Dance Referring Phys: 49 [place]  1. Left ventricular ejection fraction, by estimation, is 60 to 65%. The left ventricle has normal function. The left ventricle has no regional wall motion abnormalities. There is mild concentric left ventricular hypertrophy. Left ventricular diastolic parameters were normal.  2. Right ventricular systolic function is normal. The right ventricular size is normal. There is mildly elevated pulmonary artery systolic pressure.  3. The mitral valve is normal in structure. Mild mitral valve regurgitation. No evidence of mitral stenosis.  4. The aortic valve is tricuspid. Aortic valve regurgitation is not visualized. Mild aortic valve stenosis.  5. The inferior vena cava is normal in size with greater than 50% respiratory variability, suggesting right atrial pressure of 3 mmHg. Comparison(s): No significant change from prior study. FINDINGS  Left Ventricle: Left ventricular ejection fraction, by estimation, is 60 to 65%. The left ventricle has normal function. The left ventricle  has no regional wall motion abnormalities.  The left ventricular internal cavity size was normal in size. There is  mild concentric left ventricular hypertrophy. Left ventricular diastolic parameters were normal. Right Ventricle: The right ventricular size is normal. No increase in right ventricular wall thickness. Right ventricular systolic function is normal. There is mildly elevated pulmonary artery systolic pressure. The tricuspid regurgitant velocity is 2.90  m/s, and with an assumed right atrial pressure of 3 mmHg, the estimated right ventricular systolic pressure is 58.8 mmHg. Left Atrium: Left atrial size was normal in size. Right Atrium: Right atrial size was normal in size. Pericardium: There is no evidence of pericardial effusion. Mitral Valve: The mitral valve is normal in structure. There is mild thickening of the mitral valve leaflet(s). There is mild calcification of the mitral valve leaflet(s). Mild mitral annular calcification. Mild mitral valve regurgitation. No evidence of  mitral valve stenosis. Tricuspid Valve: The tricuspid valve is normal in structure. Tricuspid valve regurgitation is trivial. No evidence of tricuspid stenosis. Aortic Valve: The aortic valve is tricuspid. . There is moderate thickening and moderate calcification of the aortic valve. Aortic valve regurgitation is not visualized. Mild aortic stenosis is present. Moderate to severe aortic valve annular calcification. There is moderate thickening of the aortic valve. There is moderate calcification of the aortic valve. Aortic valve mean gradient measures 15.0 mmHg. Aortic valve peak gradient measures 25.5 mmHg. Aortic valve area, by VTI measures 1.44 cm. Pulmonic Valve: The pulmonic valve was not well visualized. Pulmonic valve regurgitation is trivial. Aorta: The aortic root, ascending aorta and aortic arch are all structurally normal, with no evidence of dilitation or obstruction. Venous: The inferior vena cava is normal in size  with greater than 50% respiratory variability, suggesting right atrial pressure of 3 mmHg. IAS/Shunts: The atrial septum is grossly normal.  LEFT VENTRICLE PLAX 2D LVIDd:         4.20 cm  Diastology LVIDs:         2.70 cm  LV e' lateral:   10.60 cm/s LV PW:         1.10 cm  LV E/e' lateral: 8.3 LV IVS:        1.20 cm  LV e' medial:    6.64 cm/s LVOT diam:     2.10 cm  LV E/e' medial:  13.2 LV SV:         79 LV SV Index:   42 LVOT Area:     3.46 cm  RIGHT VENTRICLE             IVC RV Basal diam:  2.50 cm     IVC diam: 1.40 cm RV S prime:     10.10 cm/s TAPSE (M-mode): 1.8 cm LEFT ATRIUM             Index       RIGHT ATRIUM           Index LA diam:        3.50 cm 1.87 cm/m  RA Area:     12.70 cm LA Vol (A2C):   40.6 ml 21.64 ml/m RA Volume:   32.10 ml  17.11 ml/m LA Vol (A4C):   51.7 ml 27.56 ml/m LA Biplane Vol: 47.4 ml 25.27 ml/m  AORTIC VALVE AV Area (Vmax):    1.20 cm AV Area (Vmean):   1.11 cm AV Area (VTI):     1.44 cm AV Vmax:           252.25 cm/s AV Vmean:  182.000 cm/s AV VTI:            0.550 m AV Peak Grad:      25.5 mmHg AV Mean Grad:      15.0 mmHg LVOT Vmax:         87.50 cm/s LVOT Vmean:        58.400 cm/s LVOT VTI:          0.229 m LVOT/AV VTI ratio: 0.42  AORTA Ao Root diam: 3.70 cm Ao Asc diam:  3.10 cm MITRAL VALVE                TRICUSPID VALVE MV Area (PHT): 3.03 cm     TR Peak grad:   33.6 mmHg MV Decel Time: 250 msec     TR Vmax:        290.00 cm/s MV E velocity: 87.50 cm/s MV A velocity: 113.00 cm/s  SHUNTS MV E/A ratio:  0.77         Systemic VTI:  0.23 m                             Systemic Diam: 2.10 cm Buford Dresser MD Electronically signed by Buford Dresser MD Signature Date/Time: 02/16/2020/3:15:21 PM    Final    Disposition   Patient was seen and examined by Dr. Harrell Gave who deemed patient as stable for discharge. Follow-up has been arranged. Discharge medications as listed below.   Follow-up Plans & Appointments     Follow-up Information     [name], MD Follow up.   Specialty: Cardiology Why: Our office will contact you to arrange a follow-up appointment.  Contact information: [address] [place] [address] [phone]                Discharge Medications   Allergies as of 02/16/2020      Reactions   Penicillins Other (See Comments)   Has patient had a PCN reaction causing immediate rash, facial/tongue/throat swelling, SOB or lightheadedness with hypotension: No Has patient had a PCN reaction causing severe rash involving mucus membranes or skin necrosis: No Has patient had a PCN reaction that required hospitalization: No Has patient had a PCN reaction occurring within the last 10 years: No If all of the above answers are "NO", then may proceed with Cephalosporin use. Passed out ? SYNCOPE ?   Lisinopril Cough   Amiodarone Nausea And Vomiting   Amlodipine Other (See Comments)   Causes dizzines   Oxycodone Other (See Comments)   hallucinations   Zofran [ondansetron Hcl] Other (See Comments)   Has an interaction with another medication pt takes    Cortisone Nausea And Vomiting, Other (See Comments)   HICCUPS   Tramadol Nausea And Vomiting, Other (See Comments)   HICCUPS      Medication List    STOP taking these medications   carvedilol 25 MG tablet Commonly known as: COREG     TAKE these medications   acetaminophen 325 MG tablet Commonly known as: TYLENOL Take 650 mg by mouth every 6 (six) hours as needed for mild pain.   aspirin EC 81 MG tablet Take 1 tablet (81 mg total) by mouth daily.   atorvastatin 80 MG tablet Commonly known as: LIPITOR TAKE 1 TABLET BY MOUTH  DAILY AT 6 PM What changed: See the new instructions.   chlorthalidone 25 MG tablet Commonly known as: HYGROTON Take 12.5 mg by mouth  every morning.   clotrimazole 1 % cream Commonly known as: LOTRIMIN Apply 1 application topically 2 (two) times daily. Inside foreskin for 1 week.   colchicine  0.6 MG tablet TAKE 2 TABS ON ONSET OF GOUT FLARE THEN REPEAT IN 1 HOUR OF ACUTE GOUT ATTACK What changed:   how much to take  how to take this  when to take this  reasons to take this  additional instructions   furosemide 20 MG tablet Commonly known as: LASIX Take 20 mg by mouth daily as needed for fluid or edema.   glipiZIDE 5 MG tablet Commonly known as: GLUCOTROL TAKE 1 TABLET BY MOUTH  DAILY BEFORE BREAKFAST What changed: See the new instructions.   glucose blood test strip Use as instructed   guaiFENesin 600 MG 12 hr tablet Commonly known as: MUCINEX Take 600 mg by mouth 2 (two) times daily as needed (congestion, cough).   hydrALAZINE 50 MG tablet Commonly known as: APRESOLINE Take 1 tablet (50 mg total) by mouth in the AM, then take 2 tablets (100 mg total) by mouth in the PM, everyday.   irbesartan 300 MG tablet Commonly known as: AVAPRO Take 300 mg by mouth daily.   isosorbide mononitrate 60 MG 24 hr tablet Commonly known as: IMDUR Take 1 tablet (60 mg total) by mouth at bedtime.   metFORMIN 500 MG tablet Commonly known as: GLUCOPHAGE TAKE 1 TABLET BY MOUTH  TWICE DAILY WITH A MEAL What changed:   how much to take  how to take this  when to take this  additional instructions   onetouch ultrasoft lancets 1 each by Other route 1 day or 1 dose for 1 dose. Use as instructed   Vicks DayQuil Cold & Flu 10-5-325 MG/15ML Liqd Generic drug: DM-Phenylephrine-Acetaminophen Take 30 mLs by mouth every 4 (four) hours as needed (cold symptoms, congestion).   Vicks NyQuil Cold & Flu 15-6.25-325 MG/15ML Liqd Generic drug: DM-Doxylamine-Acetaminophen Take 30 mLs by mouth at bedtime as needed (cold symptoms, congestion).          Outstanding Labs/Studies   7 day cardiac monitor ordered prior to discharge. Message sent by Dr. Harrell Gave to [facility]/Katrina Bowman to help coordinate.   Duration of Discharge Encounter   Greater than 30 minutes  including physician time.  Signed, Abigail Butts PA-C 02/16/2020, 4:08 PM

## 2020-02-16 NOTE — Progress Notes (Addendum)
Progress Note  Patient Name: Antonio Wells Date of Encounter: 02/16/2020  [facility] Cardiologist: Ena Dawley, MD   Subjective   Didn't sleep well, loud vent in his room and constant interruptions. Feels well. No further events. Has been up and walking without issue. When he thought more about things yesterday, he has been feeling "woozy" after taking his AM medications, though this was the first and only time he had frank syncope. We discussed options, see below.  Inpatient Medications    Scheduled Meds: . aspirin EC  81 mg Oral Daily  . atorvastatin  80 mg Oral q1800  . chlorthalidone  12.5 mg Oral q morning - 10a  . heparin  5,000 Units Subcutaneous Q8H  . hydrALAZINE  50 mg Oral q morning - 10a   And  . hydrALAZINE  100 mg Oral QPM  . insulin aspart  0-9 Units Subcutaneous TID WC  . irbesartan  300 mg Oral Daily  . isosorbide mononitrate  120 mg Oral QHS  . sodium chloride flush  3 mL Intravenous Q12H   Continuous Infusions: . sodium chloride     PRN Meds: sodium chloride, dextromethorphan **AND** acetaminophen, acetaminophen, dextromethorphan, doxylamine (Sleep), guaiFENesin, sodium chloride flush   Vital Signs    Vitals:   02/15/20 2056 02/16/20 0012 02/16/20 0331 02/16/20 1100  BP: (!) 148/77  (!) 147/74 (!) 144/79  Pulse: 100 98 97   Resp: 20 18 16 18   Temp: 98.6 F (37 C)  98.2 F (36.8 C) 98.3 F (36.8 C)  TempSrc: Oral  Oral Oral  SpO2: 100% 100% 100% 98%  Weight:   84.5 kg   Height:        Intake/Output Summary (Last 24 hours) at 02/16/2020 1216 Last data filed at 02/16/2020 0331 Gross per 24 hour  Intake --  Output 1 ml  Net -1 ml   Last 3 Weights 02/16/2020 02/15/2020 11/23/2019  Weight (lbs) 186 lb 3.2 oz 198 lb 198 lb  Weight (kg) 84.46 kg 89.812 kg 89.812 kg      Telemetry    Sinus rhythm without pauses or high grade block- Personally Reviewed  ECG    NSR 7/23 - Personally Reviewed  Physical Exam   GEN: No acute  distress.   Neck: No JVD Cardiac: RRR, no rubs, or gallops. 3/6 early to mid peaking systolic ejection murmur Respiratory: Clear to auscultation bilaterally. GI: Soft, nontender, non-distended  MS: No edema; No deformity. Neuro:  Nonfocal  Psych: Normal affect   Labs    High Sensitivity Troponin:   Recent Labs  Lab 02/15/20 1355  TROPONINIHS 5      Chemistry Recent Labs  Lab 02/15/20 1355 02/16/20 0609  NA 139 140  K 4.5 3.8  CL 104 106  CO2 26 24  GLUCOSE 102* 109*  BUN 19 22  CREATININE 1.73* 1.66*  CALCIUM 8.9 8.7*  PROT 7.0  --   ALBUMIN 4.0  --   AST 21  --   ALT 18  --   ALKPHOS 64  --   BILITOT 0.9  --   GFRNONAA 38* 40*  GFRAA 44* 47*  ANIONGAP 9 10     Hematology Recent Labs  Lab 02/15/20 1259 02/16/20 0609  WBC 4.3 4.3  RBC 4.08* 3.72*  HGB 12.4* 11.3*  HCT 38.9* 35.1*  MCV 95.3 94.4  MCH 30.4 30.4  MCHC 31.9 32.2  RDW 13.6 13.6  PLT 170 166    BNPNo results for input(s): BNP,  PROBNP in the last 168 hours.   DDimer No results for input(s): DDIMER in the last 168 hours.   Radiology    DG Chest Port 1 View  Result Date: 02/15/2020 CLINICAL DATA:  Syncope, cough and congestion. EXAM: PORTABLE CHEST 1 VIEW COMPARISON:  07/27/2017 FINDINGS: UPPER limits normal heart size and CABG changes again noted. Mildly elevated RIGHT hemidiaphragm again visualized. This is a mildly low volume film. There is no evidence of focal airspace disease, pulmonary edema, suspicious pulmonary nodule/mass, pleural effusion, or pneumothorax. No acute bony abnormalities are identified. IMPRESSION: No active disease. Electronically Signed   By: Margarette Canada M.D.   On: 02/15/2020 13:19    Cardiac Studies   Echo pending  Patient Profile     74 y.o. male with CAD with unstable angina s/p recent CABGx3 in 05/2017 with post-op atrial fib, HTN with occasional low readings, HLD, DM, mild AS/MR, CKD stage III, sleep apnea, chronic diastolic CHF admitted for  syncope.  Assessment & Plan    Syncope with documented episode of high grade 2nd degree AV block by EMS -hold carvedilol -no events on telemetry -echo pending -he would very much like to go home. We discussed that we typically watch people for 48 hours for washout, but he has had no further symptoms and heart rate is normal. Compromised that if echo without progression of his AS, he can go home later today. I counseled him extensively on red flag warning signs. Will ask for live telemetry monitor to be sent to him, he understands we cannot get this on the weekends and there will be a period of time that he is unmonitored. He understands this and lives with his wife, will not be alone and will call 911 with any syncope or near syncope  CAD s/p CABGx3 - no recent angina and troponin reassuring - continue ASA, statin, Imdur - hold beta blocker as above -consider SGLT2i as below  Mild AS: -prominent murmur on exam today -echo pending  Chronic diastolic CHF - appears euvolemic  Essential HTN -continue irbesartan 300 mg daily, isosorbide 120 mg daily -takes hydralazine 50 mg in the AM and 100 mg in the PM. May need to increase AM dose if BP remains elevated -on chlorthalidone 12.5 mg daily  DM2 - hold oral agents and add SSI while inpatient -recommend SLGT2i given CAD, CKD, diabetes, diastolic heart failure. Would consider either empagliflozin 10 mg or dapagliflozin 10 mg daily as an outpatient.  CKD stage III - Cr 1.66, c/w known baseline of 1.5-2 - no change in meds otherwise  For questions or updates, please contact [place] Please consult [email or web address] for contact info under     Signed, Buford Dresser, MD  02/16/2020, 12:16 PM

## 2020-02-16 NOTE — Discharge Instructions (Signed)
Should you feel any symptoms like you are about to pass out, or certainly if you do pass out, you need to call EMS for further evaluation.   Second-Degree Atrioventricular Block Second-degree atrioventricular (AV) block is a condition that can cause the heart to miss beats. In this condition, the electrical signals that travel from the hearts upper chambers (atria) to its lower chambers (ventricles) move too slowly or are interrupted. There are two types of second-degree AV block:  Mobitz type 1 block. This does not cause symptoms. It rarely requires treatment.  Mobitz type 2 block. This is more serious. It results in more missed beats and a very irregular heartbeat. It can lead to complete heart block, meaning that no signal exists between the upper and lower chambers of the heart. This is a dangerous condition that can lead to fainting or cardiac arrest. What are the causes? This condition may be caused by:  Any condition that damages the system that controls the hearts rate and rhythm, such as a heart attack or infection of the heart.  Overstimulation of the nerve that slows down heart rate (vagus nerve). This cause is common among well-conditioned athletes.  Some medicinesthat slow down the heart rate, such as beta blockers or calcium channel blockers.  Surgery that damages the heart. Some people are born with this condition (congenital heart block), but most people develop it over time. What increases the risk? The risk for this condition increases with age.You are also more likely to develop this condition if you have:  A history of heart attack.  Heart failure.  Coronary heart disease.  Inflammation of heart muscle (myocarditis).  Disease of heart muscle (cardiomyopathy).  Infection of the heart valves (endocarditis).  Infections or diseases that affect the heart. These include: ? Lyme disease. ? Sarcoidosis. ? Hemochromatosis. ? Rheumatic fever. ? Certain muscle  disorders. Babies are more likely to be born with heart block if:  The child's mother has an autoimmune disease, such as lupus.  The baby is born with a heart defect that affects the hearts structure.  A parent was born with a heart defect. What are the signs or symptoms? Symptoms of this condition include:  Tiredness.  Shortness of breath.  Dizziness.  Lightheadedness.  Fainting.  Chest pain. How is this diagnosed? This condition may be diagnosed based on:  A physical exam.  Your medical history.  A measurement of your pulse or heartbeat.  Tests. These may include: ? An electrocardiogram (ECG). This checks for problems with electrical activity in your heart. ? An ambulatory cardiac monitor or event monitor test. This portable device monitors your heart rate over time. ? An electrophysiology (EP) study. Long, thin tubes (catheters) are placed in your heart. The catheters give information about your heart's electrical signals. How is this treated? Treatment for this condition depends on the type of block you have and how severe it is. Treatment may involve:  Treating an underlying condition, such as heart disease.  Changing or stopping any heart medicines that may have caused heart block.  Having a permanent pacemaker placed in your chest. A pacemaker uses electrical pulses to help the heart beat normally. It is usually placed under the skin on your chest or abdomen. You may need a pacemaker if you have Mobitz type 2 block. Follow these instructions at home: General instructions      Take over-the-counter and prescription medicines only as told by your health care provider.  Follow your health care provider's recommendations  to help reduce your risk for heart disease. These may include: ? Exercising for at least 30 minutes 5 or more days (150 minutes) each week. Ask your health care provider what type of exercise is safe for you. ? Eating a heart-healthy diet with  fruits and vegetables, whole grains, low-fat dairy products, and lean proteins like poultry and eggs. Your health care provider or dietitian can help you make healthy choices. ? Maintaining a healthy weight.  Do not use any products that contain nicotine or tobacco, such as cigarettes and e-cigarettes. If you need help quitting, ask your health care provider.  Keep all follow-up visits as told by your health care provider. This is important. Alcohol use  Do not drink alcohol if: ? Your health care provider tells you not to drink. ? You are pregnant, may be pregnant, or are planning to become pregnant.  If you drink alcohol, limit how much you have: ? 0-1 drink a day for women. ? 0-2 drinks a day for men.  Be aware of how much alcohol is in your drink. In the [place] one drink equals one typical bottle of beer (12 oz), one-half glass of wine (5 oz), or one shot of hard liquor (1 oz). Contact a health care provider if you:  Feel like your heart is skipping beats.  Feel more tired than normal.  Have swelling in your lower legs or your feet. Get help right away if you:  Have symptoms that change or get worse.  Develop new symptoms.  Have chest pain, especially if the pain: ? Feels like crushing or pressure. ? Spreads to your arms, back, neck, or jaw.  Feel short of breath.  Feel light-headed or weak.  Faint. These symptoms may represent a serious problem that is an emergency. Do not wait to see if the symptoms will go away. Get medical help right away. Call your local emergency services (911 in the [place] Do not drive yourself to the hospital. Summary  Second-degree AV block is a type of heart block that can cause the heart to miss beats. In this condition, the electrical signals that control heart rate move too slowly or are interrupted.  You may need a pacemaker if you have the more serious type of AV block (Mobitz type 2 block).  Get help right away if you have chest pain,  if you faint, or if you develop new symptoms. This information is not intended to replace advice given to you by your health care provider. Make sure you discuss any questions you have with your health care provider. Document Revised: 08/23/2017 Document Reviewed: 08/23/2017 Elsevier Patient Education  2020 [employer].

## 2020-02-16 NOTE — Care Management CC44 (Signed)
Condition Code 44 Documentation Completed  Patient Details  Name: OZIAH VITANZA MRN: [id number] Date of Birth: Dec 07, 1945   Condition Code 44 given:  Yes Patient signature on Condition Code 44 notice:  Yes Documentation of 2 MD's agreement:  Yes Code 44 added to claim:  Yes    Claudie Leach, RN 02/16/2020, 5:26 PM

## 2020-02-16 NOTE — Care Management Obs Status (Signed)
[facility] NOTIFICATION   Patient Details  Name: FLYNT BREEZE MRN: [id number] Date of Birth: Nov 01, 1945   Medicare Observation Status Notification Given:  Yes    Claudie Leach, RN 02/16/2020, 5:26 PM

## 2020-02-18 ENCOUNTER — Telehealth: Payer: Self-pay | Admitting: Cardiology

## 2020-02-18 NOTE — Progress Notes (Signed)
CARDIOLOGY OFFICE NOTE  Date:  02/20/2020    [name] Date of Birth: [birth year] Medical Record #[id number]  PCP:  [name], MD  Cardiologist:  Meda Coffee    Chief Complaint  Patient presents with  . Follow-up  . Hospitalization Follow-up    Seen for Dr. Meda Coffee    History of Present Illness: Antonio Wells is a 74 y.o. male who presents today for a post hospital visit. Seen for Dr. Meda Coffee.   He has a history of known CAD with prior CABG x 3 in 2018 with LIMA-LAD and sequential SVG to OM1/OM3, post op AF, HTN, HLD, DM, mild AS/MR, CKD stage III, and sleep apnea.   Last seen by Dr. Meda Coffee in April - was felt to be doing ok. Had had some medicine adjustments for fatigue and dizziness.   Presented last week to the hospital by EMS with recurrent syncope. Had had recent URI. He is fully vaccinated. 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   Strip from EMS is in "[place]".   Comes in today. Here alone. He got home Saturday evening. Sunday evening he was a little dizzy - had taken all of his medicines at one time - did call 911 and had a EKG - Hr and rhythm were ok - see below. He has done ok the last 2 days. Trying to split up his medicines. No further syncope. He does not have his monitor yet. Did feel a little queasy after lunch today - but felt like it was what he had eaten (fish) - this is resolved.   He was not told anything about driving. He is not driving at this time. No chest pain. Not short of breath. 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   Past Medical History:  Diagnosis Date  . Arthritis    "knees" (07/11/2017)  . CKD (chronic kidney disease), stage III   . Coronary artery disease    a.  CABGx3 in 05/2017.  Marland Kitchen Gout   . Heart murmur   . High  cholesterol   . Hypertension   . Mild aortic stenosis   . Mild mitral regurgitation   . OSA on CPAP   . Postoperative atrial fibrillation ([place]) 06/2017  . Sleep apnea   . Type II diabetes mellitus ([place])     Past Surgical History:  Procedure Laterality Date  . CARDIAC CATHETERIZATION    . COLONOSCOPY  2008  . CORONARY ARTERY BYPASS GRAFT N/A 06/24/2017   Procedure: CORONARY ARTERY BYPASS GRAFTING (CABG) x 3 ; Using Left Internal Mammary Artery and Right Great Saphenous Leg Vein Harvested Endoscopically;  Surgeon: Gaye Pollack, MD;  Location: [facility];  Service: Open Heart Surgery;  Laterality: N/A;  . EYE SURGERY    . LEFT HEART CATH AND CORONARY ANGIOGRAPHY N/A 05/25/2017   Procedure: LEFT HEART CATH AND CORONARY ANGIOGRAPHY;  Surgeon: Wellington Hampshire, MD;  Location: [facility];  Service: Cardiovascular;  Laterality: N/A;  . TEE WITHOUT CARDIOVERSION N/A 06/24/2017   Procedure: TRANSESOPHAGEAL ECHOCARDIOGRAM (TEE);  Surgeon: Gaye Pollack, MD;  Location: [facility];  Service: Open Heart Surgery;  Laterality: N/A;     Medications: Current Meds  Medication Sig  .  acetaminophen (TYLENOL) 325 MG tablet Take 650 mg by mouth every 6 (six) hours as needed for mild pain.  Marland Kitchen aspirin EC 81 MG tablet Take 1 tablet (81 mg total) by mouth daily.  Marland Kitchen atorvastatin (LIPITOR) 80 MG tablet TAKE 1 TABLET BY MOUTH  DAILY AT 6 PM  . chlorthalidone (HYGROTON) 25 MG tablet Take 12.5 mg by mouth every morning.   . clotrimazole (LOTRIMIN) 1 % cream Apply 1 application topically 2 (two) times daily. Inside foreskin for 1 week.  . colchicine 0.6 MG tablet TAKE 2 TABS ON ONSET OF GOUT FLARE THEN REPEAT IN 1 HOUR OF ACUTE GOUT ATTACK  . DM-Doxylamine-Acetaminophen (VICKS NYQUIL COLD & FLU) 15-6.25-325 MG/15ML LIQD Take 30 mLs by mouth at bedtime as needed (cold symptoms, congestion).  . furosemide (LASIX) 20 MG tablet Take 20 mg by mouth daily as needed for fluid or edema.  Marland Kitchen glipiZIDE (GLUCOTROL) 5 MG  tablet TAKE 1 TABLET BY MOUTH  DAILY BEFORE BREAKFAST  . glucose blood test strip Use as instructed  . guaiFENesin (MUCINEX) 600 MG 12 hr tablet Take 600 mg by mouth 2 (two) times daily as needed (congestion, cough).  . hydrALAZINE (APRESOLINE) 50 MG tablet Take 1 tablet (50 mg total) by mouth in the AM, then take 2 tablets (100 mg total) by mouth in the PM, everyday.  . irbesartan (AVAPRO) 300 MG tablet Take 300 mg by mouth daily.  . isosorbide mononitrate (IMDUR) 60 MG 24 hr tablet Take 1 tablet (60 mg total) by mouth at bedtime.  . metFORMIN (GLUCOPHAGE) 500 MG tablet TAKE 1 TABLET BY MOUTH  TWICE DAILY WITH A MEAL     Allergies: Allergies  Allergen Reactions  . Penicillins Other (See Comments)    Has patient had a PCN reaction causing immediate rash, facial/tongue/throat swelling, SOB or lightheadedness with hypotension: No Has patient had a PCN reaction causing severe rash involving mucus membranes or skin necrosis: No Has patient had a PCN reaction that required hospitalization: No Has patient had a PCN reaction occurring within the last 10 years: No If all of the above answers are "NO", then may proceed with Cephalosporin use.   Passed out ? SYNCOPE ?  Marland Kitchen Lisinopril Cough  . Amiodarone Nausea And Vomiting  . Amlodipine Other (See Comments)    Causes dizzines  . Oxycodone Other (See Comments)    hallucinations  . Zofran [Ondansetron Hcl] Other (See Comments)    Has an interaction with another medication pt takes   . Cortisone Nausea And Vomiting and Other (See Comments)    HICCUPS  . Tramadol Nausea And Vomiting and Other (See Comments)    HICCUPS    Social History: The patient  reports that he quit smoking about 21 years ago. His smoking use included cigarettes. He has a 30.00 pack-year smoking history. He has never used smokeless tobacco. He reports that he does not drink alcohol and does not use drugs.   Family History: The patient's family history includes Cancer in  his brother; Colon cancer in his brother; Diabetes in his mother; Hypertension in his father and mother; Kidney disease in his mother; Stroke in his brother.   Review of Systems: Please see the history of present illness.   All other systems are reviewed and negative.   Physical Exam: VS:  BP (!) 144/76   Pulse 93   Ht 5\' 3"  (1.6 m)   Wt 190 lb 3.2 oz (86.3 kg)   SpO2 97%   BMI 33.69  kg/m  .  BMI Body mass index is 33.69 kg/m.       Wt Readings from Last 3 Encounters:  02/20/20 190 lb 3.2 oz (86.3 kg)  02/16/20 186 lb 3.2 oz (84.5 kg)  11/23/19 198 lb (89.8 kg)    General: Pleasant. Alert and in no acute distress.   Cardiac: Regular rate and rhythm.Outflow murmur noted. No edema.  Respiratory:  Lungs are clear to auscultation bilaterally with normal work of breathing.  GI: Soft and nontender.  MS: No deformity or atrophy. Gait and ROM intact.  Skin: Warm and dry. Color is normal.  Neuro:  Strength and sensation are intact and no gross focal deficits noted.  Psych: Alert, appropriate and with normal affect.   LABORATORY DATA:  EKG:  EKG is ordered today.  Personally reviewed by me. This demonstrates NSr with left axis - HR is 93 - reviewed with Dr. Lovena Le (DOD).  Lab Results  Component Value Date   WBC 4.3 02/16/2020   HGB 11.3 (L) 02/16/2020   HCT 35.1 (L) 02/16/2020   PLT 166 02/16/2020   GLUCOSE 109 (H) 02/16/2020   CHOL 138 07/31/2019   TRIG 126 07/31/2019   HDL 47 07/31/2019   LDLCALC 69 07/31/2019   ALT 18 02/15/2020   AST 21 02/15/2020   NA 140 02/16/2020   K 3.8 02/16/2020   CL 106 02/16/2020   CREATININE 1.66 (H) 02/16/2020   BUN 22 02/16/2020   CO2 24 02/16/2020   TSH 4.638 (H) 02/15/2020   PSA 0.88 06/03/2015   INR 1.0 10/19/2018   HGBA1C 6.8 (H) 11/23/2019   MICROALBUR 39.6 08/25/2015     BNP (last 3 results) No results for input(s): BNP in the last 8760 hours.  ProBNP (last 3 results) No results for input(s): PROBNP in the last 8760  hours.   Other Studies Reviewed Today:  Echocardiogram 02/16/20: 1. Left ventricular ejection fraction, by estimation, is 60 to 65%. The  left ventricle has normal function. The left ventricle has no regional  wall motion abnormalities. There is mild concentric left ventricular  hypertrophy. Left ventricular diastolic  parameters were normal.  2. Right ventricular systolic function is normal. The right ventricular  size is normal. There is mildly elevated pulmonary artery systolic  pressure.  3. The mitral valve is normal in structure. Mild mitral valve  regurgitation. No evidence of mitral stenosis.  4. The aortic valve is tricuspid. Aortic valve regurgitation is not  visualized. Mild aortic valve stenosis.  5. The inferior vena cava is normal in size with greater than 50%  respiratory variability, suggesting right atrial pressure of 3 mmHg.    ASSESSMENT & PLAN:    1. 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 Discussed with Dr. Lovena Le here in the office today and we have informed the patient that no driving for the next 6 months is indicated unless other definitive findings on monitor. Dr. Lovena Le would like to see him following his monitor to review those results. We will need to avoid further use of AV nodal blocking agents.   2. Mild AS - recent echo noted.   3. CKD  4. CAD with prior CABG x 3 - he has no active chest pain - no longer on beta blocker therapy.  5. HTN - fair control -  he will continue to monitor. He is splitting up his medicines - we will continue with his current regimen and he will continue to monitor.   6. Chronic diastolic HF  7. DM - per PCP - would favor SLGT2 therapy - will defer to PCP. He is a little overwhelmed with all the medicines he is currently on.   8. CKD - has follow up with  Renal  9. Mildly elevated TSH - would defer to PCP  Current medicines are reviewed with the patient today.  The patient does not have concerns regarding medicines other than what has been noted above.  The following changes have been made:  See above.  Labs/ tests ordered today include:    Orders Placed This Encounter  Procedures  . Ambulatory referral to Cardiac Electrophysiology  . CARDIAC EVENT MONITOR  . EKG 12-Lead     Disposition:   FU with Dr. Lovena Le with EP in about 5 weeks following his event monitoring of 30 days.    Patient is agreeable to this plan and will call if any problems develop in the interim.   SignedTruitt Merle, NP  02/20/2020 4:36 PM  [facility] [address] [place], [place]  [address] Phone: [phone] Fax: [phone]

## 2020-02-18 NOTE — Telephone Encounter (Signed)
Pt c/o medication issue:  1. Name of Medication:  chlorthalidone (HYGROTON) 25 MG tablet hydrALAZINE (APRESOLINE) 50 MG tablet metFORMIN (GLUCOPHAGE) 500 MG tablet aspirin EC 81 MG tablet glipiZIDE (GLUCOTROL) 5 MG tablet  2. How are you currently taking this medication (dosage and times per day)? Chlorthalidone half tablet daily, hydralazine 1 tablet in morning and 2 tablet in evening, metformin 1 tablet twice a day, aspirin 1 tablet daily, glipizide 1 tablet daily  3. Are you having a reaction (difficulty breathing--STAT)? no  4. What is your medication issue? Patient states he had to call 911 yesterday for possible syncope. He states this happened after he took his medications. He states he was also in the hospital 7/23 for syncope. He states he has not taken the medications this morning and has not had any symptoms. He would like to know if he should take them this morning.

## 2020-02-18 NOTE — Telephone Encounter (Signed)
Pt was calling in as instructed on his hospital discharge summary, to schedule his one week follow-up appt with our office. This was confirmed on his AVS.  Went over the pts AVS with him thoroughly on the phone.  Advised him, per his AVS, which meds he should be holding and which ones he should be taking.  Advised the pt to make sure he is staying plenty hydrated, and eating a balanced meal.  Advised him to make sure he is monitoring his CBGs frequently and before meals, and cover with food as instructed. Advised him to continue monitoring his BP/HR, log this information, and bring this to his follow-up appt with Truitt Merle NP on this Wednesday 7/28 at 3:15 pm. Pt education provided on how to correctly monitor his vitals with med administration.  Pt voiced no active complaints at this time.  He is asymptomatic from a cardiac perspective at this time.  He is resting comfortably at his home.  Scheduled the pt to come in for post-hospital follow-up appt with Truitt Merle NP on this Wednesday 7/28 at 3:15 pm.  He is aware to arrive 15 mins prior to this appt, and bring his logged readings to this appt.  Advised the pt if he needs [place] before that appt, or has acute cardiac complaints, he should call us to inform us of this, or seek immediate medical assistance. Pt education provided on what warrants immediate medical assistance. Pt verbalized understanding and agrees with this plan. Will send this information to Dr. Meda Coffee as a general FYI.

## 2020-02-20 ENCOUNTER — Other Ambulatory Visit: Payer: Self-pay

## 2020-02-20 ENCOUNTER — Ambulatory Visit: Payer: Medicare Other | Admitting: Nurse Practitioner

## 2020-02-20 ENCOUNTER — Encounter: Payer: Self-pay | Admitting: Nurse Practitioner

## 2020-02-20 ENCOUNTER — Other Ambulatory Visit: Payer: Self-pay | Admitting: *Deleted

## 2020-02-20 ENCOUNTER — Ambulatory Visit (INDEPENDENT_AMBULATORY_CARE_PROVIDER_SITE_OTHER): Payer: Medicare Other

## 2020-02-20 VITALS — BP 144/76 | HR 93 | Ht 63.0 in | Wt 190.2 lb

## 2020-02-20 DIAGNOSIS — I4430 Unspecified atrioventricular block: Secondary | ICD-10-CM

## 2020-02-20 DIAGNOSIS — R55 Syncope and collapse: Secondary | ICD-10-CM

## 2020-02-20 NOTE — Patient Instructions (Addendum)
After Visit Summary:  We will be checking the following labs today - NONE   Medication Instructions:    Continue with your current medicines.    If you need a refill on your cardiac medications before your next appointment, please call your pharmacy.     Testing/Procedures To Be Arranged:  We are going to do the monitor for a total of 30 days - this will be put on here today.   Follow-Up:   See Dr. Lovena Le as a new patient in about 5 to 6 weeks for discussion after the monitor.  See Dr. Meda Coffee in October per recall.     At [facility], you and your health needs are our priority.  As part of our continuing mission to provide you with exceptional heart care, we have created designated Provider Care Teams.  These Care Teams include your primary Cardiologist (physician) and Advanced Practice Providers (APPs -  Physician Assistants and Nurse Practitioners) who all work together to provide you with the care you need, when you need it.  Special Instructions:  . Stay safe, wash your hands for at least 20 seconds and wear a mask when needed.  . It was good to talk with you today.    Call the [facility] office at [phone] if you have any questions, problems or concerns.

## 2020-02-20 NOTE — Addendum Note (Signed)
Addended by: Gaetano Net on: 02/20/2020 04:53 PM   Modules accepted: Orders

## 2020-02-21 DIAGNOSIS — R55 Syncope and collapse: Secondary | ICD-10-CM | POA: Diagnosis not present

## 2020-02-21 DIAGNOSIS — I4430 Unspecified atrioventricular block: Secondary | ICD-10-CM | POA: Diagnosis not present

## 2020-02-22 ENCOUNTER — Ambulatory Visit (INDEPENDENT_AMBULATORY_CARE_PROVIDER_SITE_OTHER): Payer: Medicare Other | Admitting: Family Medicine

## 2020-02-22 ENCOUNTER — Encounter: Payer: Self-pay | Admitting: Family Medicine

## 2020-02-22 ENCOUNTER — Other Ambulatory Visit: Payer: Self-pay

## 2020-02-22 VITALS — BP 150/70 | HR 95 | Temp 98.7°F | Ht 63.0 in | Wt 190.2 lb

## 2020-02-22 DIAGNOSIS — E1122 Type 2 diabetes mellitus with diabetic chronic kidney disease: Secondary | ICD-10-CM | POA: Diagnosis not present

## 2020-02-22 DIAGNOSIS — Z87898 Personal history of other specified conditions: Secondary | ICD-10-CM

## 2020-02-22 NOTE — Progress Notes (Signed)
Subjective:  Patient ID: Antonio Wells, male    DOB: [birth year]  Age: 74 y.o. MRN: [id number]  CC:  Chief Complaint  Patient presents with  . Diabetes    3 m f/u   . Hypertension  . Hospitalization Follow-up    HPI Antonio Wells presents for  Hospital follow-up.  Syncope: Admitted July 23 through July 24. Records reviewed.  History of CAD, hypertension, CKD, diabetes, bradycardia.  Noticed abrupt syncope after breakfast, slight nausea, normal blood sugar.  Syncope for approximately 1 minute.  EMS was called.  On monitor with EMS, episode of nausea/vomiting and diaphoresis with heart rate in the 20s, thought to have transient high-grade heart block.  Possible high-grade second-degree AV block.  Resolved without intervention, sinus rhythm on telemetry.  No evidence of high-grade block on telemetry during admission.  Carvedilol was held.  Echo with EF 60 to 65%.  Mild aortic stenosis on echo.  Plan for 7-day life telemetry monitoring outpatient with a Zio patch.  Continue to hold carvedilol.  His aspirin, statin, Imdur were continued.  No further episodes of syncope. More active/less sleepy off carvedilol.  Occasional lightheadedness after meds - not sure which one - morning meds (glipizide, hydralazine 59m, asa, chlorthalidone).  Not fasting today.   BP Readings from Last 3 Encounters:  02/22/20 (!) 150/70  02/20/20 (!) 144/76  02/16/20 (!) 144/79    Diabetes: Complicated by chronic kidney disease, microalbuminuria.  Dr. SJoelyn Omsis nephrologist. appt next month. Treated with glipizide 5 mg daily, Metformin 500 mg twice daily at April 30 visit. Recommendation for SGLT2 has been given per cardiology with history of CAD, CKD, diabetes and diastolic heart failure. Tried farxiga for a week from nephrology, due to concern of mycotic infection. But he did not have those.  Met with urologist for phimosis, hx balanitis.  offered injections and circumcision as option. He does not  want to purse those options. Has follow up in few weeks.   Tolerating 5058mbid metformin (diarrhea with higher dose) Microalbumin: ratio 76 in 02/07/19.  Optho, foot exam, pneumovax: refuses pneumovax. Had covid vaccine.   Lab Results  Component Value Date   HGBA1C 6.8 (H) 11/23/2019   HGBA1C 7.2 (H) 02/07/2019   HGBA1C 6.5 (H) 08/07/2018   Lab Results  Component Value Date   MICROALBUR 39.6 08/25/2015   LDLCALC 69 07/31/2019   CREATININE 1.66 (H) 02/16/2020         History Patient Active Problem List   Diagnosis Date Noted  . Syncope 02/15/2020  . Bradycardia 02/15/2020  . Hypertensive encephalopathy 10/19/2018  . Chronic kidney disease, stage 3 10/19/2018  . DM (diabetes mellitus), type 2 with renal complications ([id number]. Cough 03/17/2018  . Lower resp. tract infection 03/17/2018  . Sleep apnea with use of continuous positive airway pressure (CPAP) 01/04/2018  . Malnutrition of moderate degree 07/14/2017  . Thrush 07/11/2017  . FTT (failure to thrive) in adult 07/11/2017  . 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 CAD (coronary artery disease) 05/25/2017  . Unstable angina ([facility]  . Hypertensive heart disease 07/14/2015  . Lower extremity edema 07/14/2015  . Aortic stenosis 01/09/2015  . OSA on CPAP 12/12/2014  . Hypersomnia with sleep apnea 12/12/2014  . Obstructive sleep apnea (adult) (pediatric) 02/04/2014  . 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 Obesity 02/04/2014  . Mitral regurgitation and aortic stenosis 01/02/2014  . DOE (dyspnea on exertion) 11/26/2013  . Gout 08/12/2011  . Hypertension 08/12/2011  .  High cholesterol 08/12/2011  . OSA (obstructive sleep apnea) 08/12/2011  . Diabetes type 2, uncontrolled ([place]) 08/12/2011   Past Medical History:  Diagnosis Date  . Arthritis    "knees" (07/11/2017)  . CKD (chronic kidney disease), stage III   . Coronary artery disease    a.  CABGx3 in 05/2017.  Marland Kitchen Gout   . Heart murmur   . High cholesterol   .  Hypertension   . Mild aortic stenosis   . Mild mitral regurgitation   . OSA on CPAP   . Postoperative atrial fibrillation ([place]) 06/2017  . Sleep apnea   . Type II diabetes mellitus ([place])    Past Surgical History:  Procedure Laterality Date  . CARDIAC CATHETERIZATION    . COLONOSCOPY  2008  . CORONARY ARTERY BYPASS GRAFT N/A 06/24/2017   Procedure: CORONARY ARTERY BYPASS GRAFTING (CABG) x 3 ; Using Left Internal Mammary Artery and Right Great Saphenous Leg Vein Harvested Endoscopically;  Surgeon: Gaye Pollack, MD;  Location: [facility];  Service: Open Heart Surgery;  Laterality: N/A;  . EYE SURGERY    . LEFT HEART CATH AND CORONARY ANGIOGRAPHY N/A 05/25/2017   Procedure: LEFT HEART CATH AND CORONARY ANGIOGRAPHY;  Surgeon: Wellington Hampshire, MD;  Location: [facility];  Service: Cardiovascular;  Laterality: N/A;  . TEE WITHOUT CARDIOVERSION N/A 06/24/2017   Procedure: TRANSESOPHAGEAL ECHOCARDIOGRAM (TEE);  Surgeon: Gaye Pollack, MD;  Location: [facility];  Service: Open Heart Surgery;  Laterality: N/A;   Allergies  Allergen Reactions  . Penicillins Other (See Comments)    Has patient had a PCN reaction causing immediate rash, facial/tongue/throat swelling, SOB or lightheadedness with hypotension: No Has patient had a PCN reaction causing severe rash involving mucus membranes or skin necrosis: No Has patient had a PCN reaction that required hospitalization: No Has patient had a PCN reaction occurring within the last 10 years: No If all of the above answers are "NO", then may proceed with Cephalosporin use.   Passed out ? SYNCOPE ?  Marland Kitchen Lisinopril Cough  . Amiodarone Nausea And Vomiting  . Amlodipine Other (See Comments)    Causes dizzines  . Oxycodone Other (See Comments)    hallucinations  . Zofran [Ondansetron Hcl] Other (See Comments)    Has an interaction with another medication pt takes   . Cortisone Nausea And Vomiting and Other (See Comments)    HICCUPS  . Tramadol  Nausea And Vomiting and Other (See Comments)    HICCUPS   Prior to Admission medications   Medication Sig Start Date End Date Taking? Authorizing Provider  acetaminophen (TYLENOL) 325 MG tablet Take 650 mg by mouth every 6 (six) hours as needed for mild pain.   Yes [provider]  aspirin EC 81 MG tablet Take 1 tablet (81 mg total) by mouth daily. 09/02/17  Yes Dunn, Dayna N, PA-C  atorvastatin (LIPITOR) 80 MG tablet TAKE 1 TABLET BY MOUTH  DAILY AT 6 PM 12/01/19  Yes [name], MD  chlorthalidone (HYGROTON) 25 MG tablet Take 12.5 mg by mouth every morning.  09/10/19  Yes [provider]  clotrimazole (LOTRIMIN) 1 % cream Apply 1 application topically 2 (two) times daily. Inside foreskin for 1 week. 11/23/19  Yes [name], MD  colchicine 0.6 MG tablet TAKE 2 TABS ON ONSET OF GOUT FLARE THEN REPEAT IN 1 HOUR OF ACUTE GOUT ATTACK 07/06/19  Yes [name], MD  DM-Doxylamine-Acetaminophen [facility] COLD & FLU) 15-6.25-325  MG/15ML LIQD Take 30 mLs by mouth at bedtime as needed (cold symptoms, congestion).   Yes [provider]  glipiZIDE (GLUCOTROL) 5 MG tablet TAKE 1 TABLET BY MOUTH  DAILY BEFORE BREAKFAST 12/01/19  Yes [name], MD  glucose blood test strip Use as instructed 01/21/20  Yes [name], MD  hydrALAZINE (APRESOLINE) 50 MG tablet Take 1 tablet (50 mg total) by mouth in the AM, then take 2 tablets (100 mg total) by mouth in the PM, everyday. 10/30/19  Yes [name], MD  irbesartan (AVAPRO) 300 MG tablet Take 300 mg by mouth daily.   Yes [provider]  isosorbide mononitrate (IMDUR) 60 MG 24 hr tablet Take 1 tablet (60 mg total) by mouth at bedtime. 12/04/19  Yes [name], MD  Lancets [facility] ULTRASOFT) lancets 1 each by Other route 1 day or 1 dose for 1 dose. Use as instructed 01/30/20 02/22/20 Yes [name], MD  metFORMIN (GLUCOPHAGE) 500 MG tablet TAKE 1 TABLET BY MOUTH  TWICE DAILY WITH A  MEAL 12/01/19  Yes [name], MD  furosemide (LASIX) 20 MG tablet Take 20 mg by mouth daily as needed for fluid or edema. Patient not taking: Reported on 02/22/2020    [provider]  guaiFENesin (MUCINEX) 600 MG 12 hr tablet Take 600 mg by mouth 2 (two) times daily as needed (congestion, cough). Patient not taking: Reported on 02/22/2020    [provider]   Social History   Socioeconomic History  . Marital status: Married    Spouse name: [name]  . Number of children: 3  . Years of education: 69  . Highest education level: Not on file  Occupational History  . Occupation: Musician  Tobacco Use  . Smoking status: Former Smoker    Packs/day: 2.00    Years: 15.00    Pack years: 30.00    Types: Cigarettes    Quit date: 07/26/1998    Years since quitting: 21.5  . Smokeless tobacco: Never Used  Vaping Use  . Vaping Use: Never used  Substance and Sexual Activity  . Alcohol use: No    Alcohol/week: 0.0 standard drinks  . Drug use: No  . Sexual activity: Yes  Other Topics Concern  . Not on file  Social History Narrative         Social Determinants of Health   Financial Resource Strain:   . Difficulty of Paying Living Expenses:   Food Insecurity:   . Worried About Charity fundraiser in the Last Year:   . Arboriculturist in the Last Year:   Transportation Needs:   . Film/video editor (Medical):   Marland Kitchen Lack of Transportation (Non-Medical):   Physical Activity:   . Days of Exercise per Week:   . Minutes of Exercise per Session:   Stress:   . Feeling of Stress :   Social Connections:   . Frequency of Communication with Friends and Family:   . Frequency of Social Gatherings with Friends and Family:   . Attends Religious Services:   . Active Member of Clubs or Organizations:   . Attends Archivist Meetings:   Marland Kitchen Marital Status:   Intimate Partner Violence:   . Fear of Current or Ex-Partner:   . Emotionally Abused:   Marland Kitchen Physically Abused:     . Sexually Abused:     Review of Systems  Constitutional: Negative for fatigue and unexpected weight change.  Eyes:  Negative for visual disturbance.  Respiratory: Negative for cough, chest tightness and shortness of breath.   Cardiovascular: Negative for chest pain, palpitations and leg swelling.  Gastrointestinal: Negative for abdominal pain and blood in stool.  Neurological: Positive for dizziness (occasional as above. ) and syncope (prior to hospital only. ). Negative for light-headedness and headaches.   Per HPI.   Objective:   Vitals:   02/22/20 0907 02/22/20 0911  BP: (!) 155/67 (!) 150/70  Pulse: 95   Temp: 98.7 F (37.1 C)   TempSrc: Temporal   SpO2: 96%   Weight: 190 lb 3.2 oz (86.3 kg)   Height: 5' 3"  (1.6 m)      Physical Exam Vitals reviewed.  Constitutional:      Appearance: He is well-developed.  HENT:     Head: Normocephalic and atraumatic.  Eyes:     Pupils: Pupils are equal, round, and reactive to light.  Neck:     Vascular: No carotid bruit or JVD.  Cardiovascular:     Rate and Rhythm: Normal rate and regular rhythm.     Heart sounds: Murmur (3/6 systolic) heard.   Pulmonary:     Effort: Pulmonary effort is normal.     Breath sounds: Normal breath sounds. No rales.  Skin:    General: Skin is warm and dry.  Neurological:     Mental Status: He is alert and oriented to person, place, and time.        Assessment & Plan:  Antonio Wells is a 75 y.o. male . History of syncope  -Possible blocked prior to hospitalization, not seen on telemetry during hospital stay.  Has Zio patch in place, cardiology follow-up plan.  Episodic lightheadedness in the morning, could be med related. No near syncope/syncope.   He has tried to determine which one may be the issue.  For now can decrease glipizide, may need adjustment of antihypertensives with his nephrologist.  Type 2 diabetes mellitus with chronic kidney disease, without long-term current use of  insulin, unspecified CKD stage ([place]) - Plan: Microalbumin / creatinine urine ratio, Hemoglobin A1c  -Controlled by prior A1c.  With episodic dizziness in the morning will hold glipizide for now, consider SGLT2 with his cardiac history, but with phimosis and history of balanitis would be cautious and watch for mycotic infections.  Has follow-up planned with urology.  Not interested in previous recommendations.  -Continue Metformin 500 mg twice daily, limited on higher doses due to gastrointestinal side effects.  No orders of the defined types were placed in this encounter.  Patient Instructions   Stop glipizide for now. Depending on your A1c would consider adding SGLT2 medicine like jardiance, Invokana, or farxiga again.  If we decide to start those medications, would want to watch for any urinary infection symptoms or genital fungal infections. Continue metformin same dose for now.   Keep follow up with cardiology as planned.     If you have lab work done today you will be contacted with your lab results within the next 2 weeks.  If you have not heard from [place] then please contact us. The fastest way to get your results is to register for My Chart.   IF you received an x-ray today, you will receive an invoice from [facility]. Please contact [facility] at [phone] with questions or concerns regarding your invoice.   IF you received labwork today, you will receive an invoice from [facility]. Please contact [facility] at [phone] with questions or concerns regarding  your invoice.   Our billing staff will not be able to assist you with questions regarding bills from these companies.  You will be contacted with the lab results as soon as they are available. The fastest way to get your results is to activate your My Chart account. Instructions are located on the last page of this paperwork. If you have not heard from [place] regarding the results in 2 weeks, please contact this  office.          Signed, [name], MD [facility]

## 2020-02-22 NOTE — Patient Instructions (Addendum)
Stop glipizide for now. Depending on your A1c would consider adding SGLT2 medicine like jardiance, Invokana, or farxiga again.  If we decide to start those medications, would want to watch for any urinary infection symptoms or genital fungal infections. Continue metformin same dose for now.   Keep follow up with cardiology as planned.     If you have lab work done today you will be contacted with your lab results within the next 2 weeks.  If you have not heard from [place] then please contact us. The fastest way to get your results is to register for My Chart.   IF you received an x-ray today, you will receive an invoice from [facility] Radiology. Please contact [facility]iology at [phone] with questions or concerns regarding your invoice.   IF you received labwork today, you will receive an invoice from [facility]. Please contact [facility] at [phone] with questions or concerns regarding your invoice.   Our billing staff will not be able to assist you with questions regarding bills from these companies.  You will be contacted with the lab results as soon as they are available. The fastest way to get your results is to activate your My Chart account. Instructions are located on the last page of this paperwork. If you have not heard from [place] regarding the results in 2 weeks, please contact this office.

## 2020-02-23 LAB — HEMOGLOBIN A1C
Est. average glucose Bld gHb Est-mCnc: 134 mg/dL
Hgb A1c MFr Bld: 6.3 % — ABNORMAL HIGH (ref 4.8–5.6)

## 2020-02-23 LAB — MICROALBUMIN / CREATININE URINE RATIO
Creatinine, Urine: 115.8 mg/dL
Microalb/Creat Ratio: 43 mg/g creat — ABNORMAL HIGH (ref 0–29)
Microalbumin, Urine: 50.1 ug/mL

## 2020-03-05 ENCOUNTER — Ambulatory Visit (INDEPENDENT_AMBULATORY_CARE_PROVIDER_SITE_OTHER): Payer: Medicare Other

## 2020-03-05 DIAGNOSIS — R55 Syncope and collapse: Secondary | ICD-10-CM

## 2020-03-05 DIAGNOSIS — I4430 Unspecified atrioventricular block: Secondary | ICD-10-CM | POA: Diagnosis not present

## 2020-03-06 DIAGNOSIS — R55 Syncope and collapse: Secondary | ICD-10-CM | POA: Diagnosis not present

## 2020-03-10 ENCOUNTER — Encounter: Payer: Self-pay | Admitting: Family Medicine

## 2020-03-11 NOTE — Telephone Encounter (Signed)
See plan last visit.  We stopped glipizide, but had planned on restarting different class of medication if blood sugar is elevated, especially with his cardiac history.    Would recommend trying Wilder Glade again, let me know if he is willing to do so I can send in a low-dose of that medication.  Would want to watch out for symptoms of yeast infection and if those occur be seen right away.  Let me know what he would like to do.

## 2020-03-24 DIAGNOSIS — N476 Balanoposthitis: Secondary | ICD-10-CM | POA: Diagnosis not present

## 2020-03-24 DIAGNOSIS — N471 Phimosis: Secondary | ICD-10-CM | POA: Diagnosis not present

## 2020-03-26 ENCOUNTER — Encounter: Payer: Self-pay | Admitting: Internal Medicine

## 2020-03-26 ENCOUNTER — Other Ambulatory Visit: Payer: Self-pay

## 2020-03-26 ENCOUNTER — Ambulatory Visit: Payer: Medicare Other | Admitting: Internal Medicine

## 2020-03-26 VITALS — BP 124/72 | HR 100 | Ht 63.0 in | Wt 184.0 lb

## 2020-03-26 DIAGNOSIS — R55 Syncope and collapse: Secondary | ICD-10-CM | POA: Diagnosis not present

## 2020-03-26 NOTE — Progress Notes (Signed)
HPI Antonio Wells is referred by Truitt Merle for evaluation of symptomatic bradycardia. The patient has a h/o HTN, CAD, s/p CABG, HTN, and syncope who was found to have advanced heart block on coreg. His beta blocker was stopped. He has felt well in the interim. He denies additional syncope and minimal palpitations. He notes that his resting HR has been a bit elevated. He denies chest pain, sob, or edema. He admits to being sedentary. Allergies  Allergen Reactions  . Penicillins Other (See Comments)    Has patient had a PCN reaction causing immediate rash, facial/tongue/throat swelling, SOB or lightheadedness with hypotension: No Has patient had a PCN reaction causing severe rash involving mucus membranes or skin necrosis: No Has patient had a PCN reaction that required hospitalization: No Has patient had a PCN reaction occurring within the last 10 years: No If all of the above answers are "NO", then may proceed with Cephalosporin use.   Passed out ? SYNCOPE ?  Marland Kitchen Lisinopril Cough  . Amiodarone Nausea And Vomiting  . Amlodipine Other (See Comments)    Causes dizzines  . Oxycodone Other (See Comments)    hallucinations  . Zofran [Ondansetron Hcl] Other (See Comments)    Has an interaction with another medication pt takes   . Cortisone Nausea And Vomiting and Other (See Comments)    HICCUPS  . Tramadol Nausea And Vomiting and Other (See Comments)    HICCUPS     Current Outpatient Medications  Medication Sig Dispense Refill  . acetaminophen (TYLENOL) 325 MG tablet Take 650 mg by mouth every 6 (six) hours as needed for mild pain.    Marland Kitchen aspirin EC 81 MG tablet Take 1 tablet (81 mg total) by mouth daily. 90 tablet 3  . atorvastatin (LIPITOR) 80 MG tablet TAKE 1 TABLET BY MOUTH  DAILY AT 6 PM 90 tablet 2  . chlorthalidone (HYGROTON) 25 MG tablet Take 12.5 mg by mouth every morning.     . clotrimazole (LOTRIMIN) 1 % cream Apply 1 application topically 2 (two) times daily. Inside  foreskin for 1 week. 30 g 0  . colchicine 0.6 MG tablet TAKE 2 TABS ON ONSET OF GOUT FLARE THEN REPEAT IN 1 HOUR OF ACUTE GOUT ATTACK 30 tablet 0  . DM-Doxylamine-Acetaminophen (VICKS NYQUIL COLD & FLU) 15-6.25-325 MG/15ML LIQD Take 30 mLs by mouth at bedtime as needed (cold symptoms, congestion).    . furosemide (LASIX) 20 MG tablet Take 20 mg by mouth daily as needed for fluid or edema.     Marland Kitchen glucose blood test strip Use as instructed 100 each 12  . guaiFENesin (MUCINEX) 600 MG 12 hr tablet Take 600 mg by mouth 2 (two) times daily as needed (congestion, cough).     . hydrALAZINE (APRESOLINE) 50 MG tablet Take 1 tablet (50 mg total) by mouth in the AM, then take 2 tablets (100 mg total) by mouth in the PM, everyday. 270 tablet 2  . irbesartan (AVAPRO) 300 MG tablet Take 300 mg by mouth daily.    . isosorbide mononitrate (IMDUR) 60 MG 24 hr tablet Take 1 tablet (60 mg total) by mouth at bedtime. 90 tablet 3  . metFORMIN (GLUCOPHAGE) 500 MG tablet TAKE 1 TABLET BY MOUTH  TWICE DAILY WITH A MEAL 180 tablet 1  . Lancets (ONETOUCH ULTRASOFT) lancets 1 each by Other route 1 day or 1 dose for 1 dose. Use as instructed 100 each 12   No current facility-administered medications for  this visit.     Past Medical History:  Diagnosis Date  . Arthritis    "knees" (07/11/2017)  . CKD (chronic kidney disease), stage III   . Coronary artery disease    a.  CABGx3 in 05/2017.  Marland Kitchen Gout   . Heart murmur   . High cholesterol   . Hypertension   . Mild aortic stenosis   . Mild mitral regurgitation   . OSA on CPAP   . Postoperative atrial fibrillation ([place]) 06/2017  . Sleep apnea   . Type II diabetes mellitus ([facility])     ROS:   All systems reviewed and negative except as noted in the HPI.   Past Surgical History:  Procedure Laterality Date  . CARDIAC CATHETERIZATION    . COLONOSCOPY  2008  . CORONARY ARTERY BYPASS GRAFT N/A 06/24/2017   Procedure: CORONARY ARTERY BYPASS GRAFTING (CABG) x 3 ; Using  Left Internal Mammary Artery and Right Great Saphenous Leg Vein Harvested Endoscopically;  Surgeon: Gaye Pollack, MD;  Location: [facility];  Service: Open Heart Surgery;  Laterality: N/A;  . EYE SURGERY    . LEFT HEART CATH AND CORONARY ANGIOGRAPHY N/A 05/25/2017   Procedure: LEFT HEART CATH AND CORONARY ANGIOGRAPHY;  Surgeon: Wellington Hampshire, MD;  Location: [facility];  Service: Cardiovascular;  Laterality: N/A;  . TEE WITHOUT CARDIOVERSION N/A 06/24/2017   Procedure: TRANSESOPHAGEAL ECHOCARDIOGRAM (TEE);  Surgeon: Gaye Pollack, MD;  Location: [facility];  Service: Open Heart Surgery;  Laterality: N/A;     Family History  Problem Relation Age of Onset  . Hypertension Mother   . Kidney disease Mother   . Diabetes Mother   . Hypertension Father   . Cancer Brother        lung  . Colon cancer Brother   . Stroke Brother   . Esophageal cancer Neg Hx   . Rectal cancer Neg Hx   . Stomach cancer Neg Hx      Social History   Socioeconomic History  . Marital status: Married    Spouse name: Antonio Wells  . Number of children: 3  . Years of education: 58  . Highest education level: Not on file  Occupational History  . Occupation: Musician  Tobacco Use  . Smoking status: Former Smoker    Packs/day: 2.00    Years: 15.00    Pack years: 30.00    Types: Cigarettes    Quit date: 07/26/1998    Years since quitting: 21.6  . Smokeless tobacco: Never Used  Vaping Use  . Vaping Use: Never used  Substance and Sexual Activity  . Alcohol use: No    Alcohol/week: 0.0 standard drinks  . Drug use: No  . Sexual activity: Yes  Other Topics Concern  . Not on file  Social History Narrative         Social Determinants of Health   Financial Resource Strain:   . Difficulty of Paying Living Expenses: Not on file  Food Insecurity:   . Worried About Charity fundraiser in the Last Year: Not on file  . Ran Out of Food in the Last Year: Not on file  Transportation Needs:   . Lack of  Transportation (Medical): Not on file  . Lack of Transportation (Non-Medical): Not on file  Physical Activity:   . Days of Exercise per Week: Not on file  . Minutes of Exercise per Session: Not on file  Stress:   . Feeling of Stress : Not  on file  Social Connections:   . Frequency of Communication with Friends and Family: Not on file  . Frequency of Social Gatherings with Friends and Family: Not on file  . Attends Religious Services: Not on file  . Active Member of Clubs or Organizations: Not on file  . Attends Archivist Meetings: Not on file  . Marital Status: Not on file  Intimate Partner Violence:   . Fear of Current or Ex-Partner: Not on file  . Emotionally Abused: Not on file  . Physically Abused: Not on file  . Sexually Abused: Not on file     BP 124/72   Pulse 100   Ht 5\' 3"  (1.6 m)   Wt 184 lb (83.5 kg)   SpO2 95%   BMI 32.59 kg/m   Physical Exam:  Well appearing 74 yo man, NAD HEENT: Unremarkable Neck:  No JVD, no thyromegally Lymphatics:  No adenopathy Back:  No CVA tenderness Lungs:  Clear with no wheezes HEART:  Regular rate rhythm, no murmurs, no rubs, no clicks Abd:  soft, positive bowel sounds, no organomegally, no rebound, no guarding Ext:  2 plus pulses, no edema, no cyanosis, no clubbing Skin:  No rashes no nodules Neuro:  CN II through XII intact, motor grossly intact   Assess/Plan: 1. Heart block - this appears to be related to coreg. I have asked that he avoid any beta blockers in the future.  2. Sinus tachycardia - the etiology is unclear but he appears to be asymptomatic. I have asked him to start exercising regularly. He will avoid caffeine or other stimulants.  3. HTN - his bp is well controlled today. 4. CAD - he is s/p cabg with no anginal symptoms.  Carleene Overlie Brinna Divelbiss,MD

## 2020-03-26 NOTE — Patient Instructions (Addendum)
Medication Instructions:  Your physician recommends that you continue on your current medications as directed. Please refer to the Current Medication list given to you today.  Labwork: None ordered.  Testing/Procedures: None ordered.  Follow-Up: Your physician wants you to follow-up in: as needed with Dr. Taylor.      Any Other Special Instructions Will Be Listed Below (If Applicable).  If you need a refill on your cardiac medications before your next appointment, please call your pharmacy.   

## 2020-03-27 ENCOUNTER — Other Ambulatory Visit: Payer: Self-pay | Admitting: *Deleted

## 2020-03-27 DIAGNOSIS — I4430 Unspecified atrioventricular block: Secondary | ICD-10-CM

## 2020-03-27 DIAGNOSIS — R55 Syncope and collapse: Secondary | ICD-10-CM

## 2020-04-23 DIAGNOSIS — N183 Chronic kidney disease, stage 3 unspecified: Secondary | ICD-10-CM | POA: Diagnosis not present

## 2020-04-23 DIAGNOSIS — R809 Proteinuria, unspecified: Secondary | ICD-10-CM | POA: Diagnosis not present

## 2020-04-23 DIAGNOSIS — I129 Hypertensive chronic kidney disease with stage 1 through stage 4 chronic kidney disease, or unspecified chronic kidney disease: Secondary | ICD-10-CM | POA: Diagnosis not present

## 2020-04-25 DIAGNOSIS — E119 Type 2 diabetes mellitus without complications: Secondary | ICD-10-CM | POA: Diagnosis not present

## 2020-05-05 DIAGNOSIS — N476 Balanoposthitis: Secondary | ICD-10-CM | POA: Diagnosis not present

## 2020-05-05 DIAGNOSIS — N471 Phimosis: Secondary | ICD-10-CM | POA: Diagnosis not present

## 2020-05-14 ENCOUNTER — Other Ambulatory Visit: Payer: Self-pay | Admitting: Urology

## 2020-05-15 ENCOUNTER — Other Ambulatory Visit: Payer: Self-pay | Admitting: Family Medicine

## 2020-05-28 ENCOUNTER — Other Ambulatory Visit: Payer: Self-pay

## 2020-05-28 ENCOUNTER — Ambulatory Visit (INDEPENDENT_AMBULATORY_CARE_PROVIDER_SITE_OTHER): Payer: Medicare Other | Admitting: Family Medicine

## 2020-05-28 ENCOUNTER — Encounter: Payer: Self-pay | Admitting: Family Medicine

## 2020-05-28 VITALS — BP 130/72 | HR 88 | Temp 97.6°F | Ht 63.0 in | Wt 183.0 lb

## 2020-05-28 DIAGNOSIS — I10 Essential (primary) hypertension: Secondary | ICD-10-CM

## 2020-05-28 DIAGNOSIS — I2510 Atherosclerotic heart disease of native coronary artery without angina pectoris: Secondary | ICD-10-CM | POA: Diagnosis not present

## 2020-05-28 DIAGNOSIS — E1122 Type 2 diabetes mellitus with diabetic chronic kidney disease: Secondary | ICD-10-CM | POA: Diagnosis not present

## 2020-05-28 DIAGNOSIS — E785 Hyperlipidemia, unspecified: Secondary | ICD-10-CM

## 2020-05-28 MED ORDER — METFORMIN HCL 500 MG PO TABS: ORAL_TABLET | ORAL | 1 refills | Status: DC

## 2020-05-28 MED ORDER — ATORVASTATIN CALCIUM 80 MG PO TABS: ORAL_TABLET | ORAL | 2 refills | Status: DC

## 2020-05-28 NOTE — Patient Instructions (Addendum)
No change in meds at this time. Follow up in 6 months. Thanks for coming in today.     If you have lab work done today you will be contacted with your lab results within the next 2 weeks.  If you have not heard from [place] then please contact us. The fastest way to get your results is to register for My Chart.   IF you received an x-ray today, you will receive an invoice from [facility]. Please contact [facility] at [phone] with questions or concerns regarding your invoice.   IF you received labwork today, you will receive an invoice from [facility]. Please contact [facility] at [phone] with questions or concerns regarding your invoice.   Our billing staff will not be able to assist you with questions regarding bills from these companies.  You will be contacted with the lab results as soon as they are available. The fastest way to get your results is to activate your My Chart account. Instructions are located on the last page of this paperwork. If you have not heard from [place] regarding the results in 2 weeks, please contact this office.

## 2020-05-28 NOTE — Progress Notes (Signed)
Subjective:  Patient ID: Antonio Wells, male    DOB: 08/30/1945  Age: 74 y.o. MRN: [id number]  CC:  Chief Complaint  Patient presents with  . Follow-up    on diabetes. Pt reports no issues with this condition. pt states he checks his BS at home last night he got a non fasting reading of 140mg /dl. Pt reports he is doing his best to avoid sugars and other foods he knows to stay away from.    HPI Antonio Wells presents for   Diabetes: With chronic kidney disease, microalbuminuria, followed by nephrology Dr. [name]. Improved A1c in July.  Metformin 500 mg twice daily at that time.  Glipizide 5 mg daily was held at his last visit with episodic morning dizziness.  Continued on just Metformin.  Option of SGLT2 with his cardiac history but with phimosis and balanitis history would need to be cautious with starting that medication.  Plan circumcision December 3. Microalbumin: Ratio of 43 on July 30 Fasting: unknonwn. No symptomatic lows.  Postprandial: 144 He is on statin, ARB. Hx of AV block, cardiology/EP visit 03/26/20. Beta blocker prior discontinued. Avoiding betablocker in future.   Optho, foot exam, pneumovax: Refuses Pneumovax.  Otherwise up-to-date.  Fasting today.   Lab Results  Component Value Date   HGBA1C 6.3 (H) 02/22/2020   HGBA1C 6.8 (H) 11/23/2019   HGBA1C 7.2 (H) 02/07/2019   Lab Results  Component Value Date   MICROALBUR 39.6 08/25/2015   LDLCALC 69 07/31/2019   CREATININE 1.66 (H) 02/16/2020    History Patient Active Problem List   Diagnosis Date Noted  . Syncope 02/15/2020  . Bradycardia 02/15/2020  . Hypertensive encephalopathy 10/19/2018  . Chronic kidney disease, stage 3 10/19/2018  . DM (diabetes mellitus), type 2 with renal complications ([place]) 27/09/5007  . Cough 03/17/2018  . Lower resp. tract infection 03/17/2018  . Sleep apnea with use of continuous positive airway pressure (CPAP) 01/04/2018  . Malnutrition of moderate degree 07/14/2017    . Thrush 07/11/2017  . FTT (failure to thrive) in adult 07/11/2017  . 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 CAD (coronary artery disease) 05/25/2017  . Unstable angina ([place])   . Hypertensive heart disease 07/14/2015  . Lower extremity edema 07/14/2015  . Aortic stenosis 01/09/2015  . OSA on CPAP 12/12/2014  . Hypersomnia with sleep apnea 12/12/2014  . Obstructive sleep apnea (adult) (pediatric) 02/04/2014  . 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 Obesity 02/04/2014  . Mitral regurgitation and aortic stenosis 01/02/2014  . DOE (dyspnea on exertion) 11/26/2013  . Gout 08/12/2011  . Hypertension 08/12/2011  . High cholesterol 08/12/2011  . OSA (obstructive sleep apnea) 08/12/2011  . Diabetes type 2, uncontrolled ([place]) 08/12/2011   Past Medical History:  Diagnosis Date  . Arthritis    "knees" (07/11/2017)  . CKD (chronic kidney disease), stage III ([place])   . Coronary artery disease    a.  CABGx3 in 05/2017.  Marland Kitchen Gout   . Heart murmur   . High cholesterol   . Hypertension   . Mild aortic stenosis   . Mild mitral regurgitation   . OSA on CPAP   . Postoperative atrial fibrillation ([place]) 06/2017  . Sleep apnea   . Type II diabetes mellitus ([place])    Past Surgical History:  Procedure Laterality Date  . CARDIAC CATHETERIZATION    . COLONOSCOPY  2008  . CORONARY ARTERY BYPASS GRAFT N/A 06/24/2017   Procedure: CORONARY ARTERY BYPASS GRAFTING (CABG) x 3 ; Using  Left Internal Mammary Artery and Right Great Saphenous Leg Vein Harvested Endoscopically;  Surgeon: Gaye Pollack, MD;  Location: [facility];  Service: Open Heart Surgery;  Laterality: N/A;  . EYE SURGERY    . LEFT HEART CATH AND CORONARY ANGIOGRAPHY N/A 05/25/2017   Procedure: LEFT HEART CATH AND CORONARY ANGIOGRAPHY;  Surgeon: Wellington Hampshire, MD;  Location: [facility];  Service: Cardiovascular;  Laterality: N/A;  . TEE WITHOUT CARDIOVERSION N/A 06/24/2017   Procedure: TRANSESOPHAGEAL ECHOCARDIOGRAM (TEE);  Surgeon: Gaye Pollack, MD;  Location: [facility];  Service: Open Heart Surgery;  Laterality: N/A;   Allergies  Allergen Reactions  . Penicillins Other (See Comments)    Has patient had a PCN reaction causing immediate rash, facial/tongue/throat swelling, SOB or lightheadedness with hypotension: No Has patient had a PCN reaction causing severe rash involving mucus membranes or skin necrosis: No Has patient had a PCN reaction that required hospitalization: No Has patient had a PCN reaction occurring within the last 10 years: No If all of the above answers are "NO", then may proceed with Cephalosporin use.   Passed out ? SYNCOPE ?  Marland Kitchen Lisinopril Cough  . Amiodarone Nausea And Vomiting  . Amlodipine Other (See Comments)    Causes dizzines  . Oxycodone Other (See Comments)    hallucinations  . Zofran [Ondansetron Hcl] Other (See Comments)    Has an interaction with another medication pt takes   . Cortisone Nausea And Vomiting and Other (See Comments)    HICCUPS  . Tramadol Nausea And Vomiting and Other (See Comments)    HICCUPS   Prior to Admission medications   Medication Sig Start Date End Date Taking? Authorizing Provider  acetaminophen (TYLENOL) 325 MG tablet Take 650 mg by mouth every 6 (six) hours as needed for mild pain.   Yes [provider]  aspirin EC 81 MG tablet Take 1 tablet (81 mg total) by mouth daily. 09/02/17  Yes Dunn, Dayna N, PA-C  atorvastatin (LIPITOR) 80 MG tablet TAKE 1 TABLET BY MOUTH  DAILY AT 6 PM 12/01/19  Yes [name], MD  chlorthalidone (HYGROTON) 25 MG tablet Take 12.5 mg by mouth every morning.  09/10/19  Yes [provider]  clotrimazole (LOTRIMIN) 1 % cream Apply 1 application topically 2 (two) times daily. Inside foreskin for 1 week. 11/23/19  Yes [name], MD  colchicine 0.6 MG tablet TAKE 2 TABS ON ONSET OF GOUT FLARE THEN REPEAT IN 1 HOUR OF ACUTE GOUT ATTACK 07/06/19  Yes [name], MD  DM-Doxylamine-Acetaminophen (VICKS NYQUIL  COLD & FLU) 15-6.25-325 MG/15ML LIQD Take 30 mLs by mouth at bedtime as needed (cold symptoms, congestion).   Yes [provider]  furosemide (LASIX) 20 MG tablet Take 20 mg by mouth daily as needed for fluid or edema.    Yes [provider]  glipiZIDE (GLUCOTROL) 5 MG tablet TAKE 1 TABLET BY MOUTH  DAILY BEFORE BREAKFAST 05/15/20  Yes [name], MD  glucose blood test strip Use as instructed 01/21/20  Yes [name], MD  guaiFENesin (MUCINEX) 600 MG 12 hr tablet Take 600 mg by mouth 2 (two) times daily as needed (congestion, cough).    Yes [provider]  hydrALAZINE (APRESOLINE) 50 MG tablet Take 1 tablet (50 mg total) by mouth in the AM, then take 2 tablets (100 mg total) by mouth in the PM, everyday. 10/30/19  Yes [name], MD  irbesartan (AVAPRO) 300 MG  tablet Take 300 mg by mouth daily.   Yes [provider]  isosorbide mononitrate (IMDUR) 60 MG 24 hr tablet Take 1 tablet (60 mg total) by mouth at bedtime. 12/04/19  Yes [name], MD  metFORMIN (GLUCOPHAGE) 500 MG tablet TAKE 1 TABLET BY MOUTH  TWICE DAILY WITH A MEAL 12/01/19  Yes [name], MD  Lancets [facility] ULTRASOFT) lancets 1 each by Other route 1 day or 1 dose for 1 dose. Use as instructed 01/30/20 02/22/20  [name], MD   Social History   Socioeconomic History  . Marital status: Married    Spouse name: [name]  . Number of children: 3  . Years of education: 55  . Highest education level: Not on file  Occupational History  . Occupation: Musician  Tobacco Use  . Smoking status: Former Smoker    Packs/day: 2.00    Years: 15.00    Pack years: 30.00    Types: Cigarettes    Quit date: 07/26/1998    Years since quitting: 21.8  . Smokeless tobacco: Never Used  Vaping Use  . Vaping Use: Never used  Substance and Sexual Activity  . Alcohol use: No    Alcohol/week: 0.0 standard drinks  . Drug use: No  . Sexual activity: Yes  Other Topics Concern  .  Not on file  Social History Narrative         Social Determinants of Health   Financial Resource Strain:   . Difficulty of Paying Living Expenses: Not on file  Food Insecurity:   . Worried About Charity fundraiser in the Last Year: Not on file  . Ran Out of Food in the Last Year: Not on file  Transportation Needs:   . Lack of Transportation (Medical): Not on file  . Lack of Transportation (Non-Medical): Not on file  Physical Activity:   . Days of Exercise per Week: Not on file  . Minutes of Exercise per Session: Not on file  Stress:   . Feeling of Stress : Not on file  Social Connections:   . Frequency of Communication with Friends and Family: Not on file  . Frequency of Social Gatherings with Friends and Family: Not on file  . Attends Religious Services: Not on file  . Active Member of Clubs or Organizations: Not on file  . Attends Archivist Meetings: Not on file  . Marital Status: Not on file  Intimate Partner Violence:   . Fear of Current or Ex-Partner: Not on file  . Emotionally Abused: Not on file  . Physically Abused: Not on file  . Sexually Abused: Not on file    Review of Systems  Constitutional: Negative for fatigue and unexpected weight change.  Eyes: Negative for visual disturbance.  Respiratory: Negative for cough, chest tightness and shortness of breath.   Cardiovascular: Negative for chest pain, palpitations and leg swelling.  Gastrointestinal: Negative for abdominal pain and blood in stool.  Neurological: Negative for dizziness, light-headedness and headaches.     Objective:   Vitals:   05/28/20 0932 05/28/20 0936  BP: (!) 145/74 130/72  Pulse: 88   Temp: 97.6 F (36.4 C)   TempSrc: Temporal   SpO2: 98%   Weight: 183 lb (83 kg)   Height: 5\' 3"  (1.6 m)      Physical Exam Vitals reviewed.  Constitutional:      Appearance: He is well-developed.  HENT:     Head: Normocephalic and atraumatic.  Eyes:  Pupils: Pupils are equal,  round, and reactive to light.  Neck:     Vascular: No carotid bruit or JVD.  Cardiovascular:     Rate and Rhythm: Normal rate and regular rhythm.     Heart sounds: Murmur (2/6 SEM) heard.   Pulmonary:     Effort: Pulmonary effort is normal.     Breath sounds: Normal breath sounds. No rales.  Skin:    General: Skin is warm and dry.  Neurological:     Mental Status: He is alert and oriented to person, place, and time.       Assessment & Plan:  Antonio Wells is a 74 y.o. male . Hyperlipidemia, unspecified hyperlipidemia type - Plan: Lipid panel  - continue statin. Labs pending.   Coronary artery disease involving native heart without angina pectoris, unspecified vessel or lesion type - Plan: Lipid panel, atorvastatin (LIPITOR) 80 MG tablet  - continue follow up with cardiology. Off betablocker d/t block.   Type 2 diabetes mellitus with chronic kidney disease, without long-term current use of insulin, unspecified CKD stage ([place]) - Plan: Hemoglobin A1c, metFORMIN (GLUCOPHAGE) 500 MG tablet  -  Stable, tolerating current regimen. Medications refilled. Labs pending as above.   Essential hypertension - Plan: Comprehensive metabolic panel  -  Stable, tolerating current regimen.Labs pending as above.    Meds ordered this encounter  Medications  . atorvastatin (LIPITOR) 80 MG tablet    Sig: TAKE 1 TABLET BY MOUTH  DAILY AT 6 PM    Dispense:  90 tablet    Refill:  2    Requesting 1 year supply  . metFORMIN (GLUCOPHAGE) 500 MG tablet    Sig: TAKE 1 TABLET BY MOUTH  TWICE DAILY WITH A MEAL    Dispense:  180 tablet    Refill:  1    Requesting 1 year supply   Patient Instructions   No change in meds at this time. Follow up in 6 months. Thanks for coming in today.     If you have lab work done today you will be contacted with your lab results within the next 2 weeks.  If you have not heard from [place] then please contact us. The fastest way to get your results is to register for  My Chart.   IF you received an x-ray today, you will receive an invoice from [facility]. Please contact [facility] at [phone] with questions or concerns regarding your invoice.   IF you received labwork today, you will receive an invoice from [facility]. Please contact [facility] at [phone] with questions or concerns regarding your invoice.   Our billing staff will not be able to assist you with questions regarding bills from these companies.  You will be contacted with the lab results as soon as they are available. The fastest way to get your results is to activate your My Chart account. Instructions are located on the last page of this paperwork. If you have not heard from [place] regarding the results in 2 weeks, please contact this office.         Signed, [name], MD [facility]

## 2020-05-29 LAB — COMPREHENSIVE METABOLIC PANEL
ALT: 17 IU/L (ref 0–44)
AST: 20 IU/L (ref 0–40)
Albumin/Globulin Ratio: 2 (ref 1.2–2.2)
Albumin: 4.5 g/dL (ref 3.7–4.7)
Alkaline Phosphatase: 69 IU/L (ref 44–121)
BUN/Creatinine Ratio: 11 (ref 10–24)
BUN: 19 mg/dL (ref 8–27)
Bilirubin Total: 0.5 mg/dL (ref 0.0–1.2)
CO2: 22 mmol/L (ref 20–29)
Calcium: 8.9 mg/dL (ref 8.6–10.2)
Chloride: 105 mmol/L (ref 96–106)
Creatinine, Ser: 1.68 mg/dL — ABNORMAL HIGH (ref 0.76–1.27)
GFR calc Af Amer: 46 mL/min/{1.73_m2} — ABNORMAL LOW (ref >59)
GFR calc non Af Amer: 40 mL/min/{1.73_m2} — ABNORMAL LOW (ref >59)
Globulin, Total: 2.3 g/dL (ref 1.5–4.5)
Glucose: 135 mg/dL — ABNORMAL HIGH (ref 65–99)
Potassium: 4.1 mmol/L (ref 3.5–5.2)
Sodium: 141 mmol/L (ref 134–144)
Total Protein: 6.8 g/dL (ref 6.0–8.5)

## 2020-05-29 LAB — LIPID PANEL
Chol/HDL Ratio: 2.3 ratio (ref 0.0–5.0)
Cholesterol, Total: 141 mg/dL (ref 100–199)
HDL: 61 mg/dL (ref >39)
LDL Chol Calc (NIH): 65 mg/dL (ref 0–99)
Triglycerides: 76 mg/dL (ref 0–149)
VLDL Cholesterol Cal: 15 mg/dL (ref 5–40)

## 2020-05-29 LAB — HEMOGLOBIN A1C
Est. average glucose Bld gHb Est-mCnc: 157 mg/dL
Hgb A1c MFr Bld: 7.1 % — ABNORMAL HIGH (ref 4.8–5.6)

## 2020-06-17 ENCOUNTER — Other Ambulatory Visit: Payer: Self-pay

## 2020-06-17 ENCOUNTER — Encounter (HOSPITAL_BASED_OUTPATIENT_CLINIC_OR_DEPARTMENT_OTHER): Payer: Self-pay | Admitting: Urology

## 2020-06-17 NOTE — Progress Notes (Addendum)
Spoke w/ via phone for pre-op interview---pt Lab needs dos----  I stat 8             COVID test ------06-25-2020 1330 Arrive at -------1030 am 06-27-2020 NPO after MN NO Solid Food.  Clear liquids from MN until---930 am Medications to take morning of surgery -----isosorbide mononitrate, hydrazaline Diabetic medication -----none day of surgery Patient Special Instructions -----none Pre-Op special Istructions -----none Patient verbalized understanding of instructions that were given at this phone interview. Patient denies shortness of breath, chest pain, fever, cough at this phone interview.  Anesthesia Review:no  PCP: dr Cindee Lame  Cardiologist :dr Smitty Cords 03-26-2020 f/u prn  Chest x-ray :02-15-2020 epic EKG :02-20-2020 epic Echo :02-16-2020 epic Stress test:non Cardiac Cath : 06-24-2017 Addendum: lov dr Thurmond Butts [facility] 04-23-2020 on chart Activity level: [place] house and can climb stairs without problems Sleep Study/ CPAP :does not currently use cpap does not feel like he needs it anymore last used 3 weeks ago Fasting Blood Sugar : 124-140     / Checks Blood Sugar 1- time a day:   Blood Thinner/ Instructions /Last Dose:n/a ASA / Instructions/ Last Dose : stopping 81 mg aspirin per drwinter last dose will be 06-21-2020

## 2020-06-18 ENCOUNTER — Encounter (HOSPITAL_BASED_OUTPATIENT_CLINIC_OR_DEPARTMENT_OTHER): Payer: Self-pay | Admitting: Urology

## 2020-06-25 ENCOUNTER — Other Ambulatory Visit (HOSPITAL_COMMUNITY)
Admission: RE | Admit: 2020-06-25 | Discharge: 2020-06-25 | Disposition: A | Discharge status: Home / Self Care | Payer: Medicare Other | Source: Ambulatory Visit | Attending: Urology | Admitting: Urology

## 2020-06-25 DIAGNOSIS — Z20822 Contact with and (suspected) exposure to covid-19: Secondary | ICD-10-CM | POA: Diagnosis not present

## 2020-06-25 DIAGNOSIS — Z01812 Encounter for preprocedural laboratory examination: Secondary | ICD-10-CM | POA: Insufficient documentation

## 2020-06-25 LAB — SARS CORONAVIRUS 2 (TAT 6-24 HRS): SARS Coronavirus 2: NEGATIVE

## 2020-06-26 NOTE — Anesthesia Preprocedure Evaluation (Addendum)
Anesthesia Evaluation  Patient identified by MRN, date of birth, ID band Patient awake    Reviewed: Allergy & Precautions, NPO status , Patient's Chart, lab work & pertinent test results  Airway Mallampati: II  TM Distance: >3 FB Neck ROM: Full    Dental no notable dental hx. (+) Dental Advisory Given, Teeth Intact   Pulmonary sleep apnea , former smoker,    Pulmonary exam normal breath sounds clear to auscultation       Cardiovascular hypertension, Pt. on home beta blockers and Pt. on medications + CAD  Normal cardiovascular exam Rhythm:Regular Rate:Normal  02/16/20 Echo 1. Left ventricular ejection fraction, by estimation, is 60 to 65%. The  left ventricle has normal function. The left ventricle has no regional  wall motion abnormalities. There is mild concentric left ventricular  hypertrophy. Left ventricular diastolic  parameters were normal   Neuro/Psych negative neurological ROS  negative psych ROS   GI/Hepatic negative GI ROS, Neg liver ROS,   Endo/Other  diabetes  Renal/GU Renal InsufficiencyRenal disease     Musculoskeletal  (+) Arthritis ,   Abdominal   Peds  Hematology Lab Results      Component                Value               Date                      WBC                      4.3                 02/16/2020                HGB                      13.9                06/27/2020                HCT                      41.0                06/27/2020                MCV                      94.4                02/16/2020                PLT                      166                 02/16/2020              Anesthesia Other Findings   Reproductive/Obstetrics                           Anesthesia Physical Anesthesia Plan  ASA: III  Anesthesia Plan: General   Post-op Pain Management:    Induction: Intravenous  PONV Risk Score and Plan: 3 and Treatment may vary due to age or  medical condition, Ondansetron and Dexamethasone  Airway  Management Planned: LMA  Additional Equipment: None  Intra-op Plan:   Post-operative Plan:   Informed Consent: I have reviewed the patients History and Physical, chart, labs and discussed the procedure including the risks, benefits and alternatives for the proposed anesthesia with the patient or authorized representative who has indicated his/her understanding and acceptance.     Dental advisory given  Plan Discussed with: CRNA and Anesthesiologist  Anesthesia Plan Comments:        Anesthesia Quick Evaluation

## 2020-06-27 ENCOUNTER — Ambulatory Visit (HOSPITAL_BASED_OUTPATIENT_CLINIC_OR_DEPARTMENT_OTHER)
Admission: RE | Admit: 2020-06-27 | Discharge: 2020-06-27 | Disposition: A | Discharge status: Home / Self Care | Payer: Medicare Other | Attending: Urology | Admitting: Urology

## 2020-06-27 ENCOUNTER — Ambulatory Visit (HOSPITAL_BASED_OUTPATIENT_CLINIC_OR_DEPARTMENT_OTHER): Payer: Medicare Other | Admitting: Anesthesiology

## 2020-06-27 ENCOUNTER — Encounter (HOSPITAL_BASED_OUTPATIENT_CLINIC_OR_DEPARTMENT_OTHER): Payer: Self-pay | Admitting: Urology

## 2020-06-27 ENCOUNTER — Encounter (HOSPITAL_BASED_OUTPATIENT_CLINIC_OR_DEPARTMENT_OTHER)
Admission: RE | Disposition: A | Discharge status: Home / Self Care | Payer: Self-pay | Source: Home / Self Care | Attending: Urology

## 2020-06-27 DIAGNOSIS — Z885 Allergy status to narcotic agent status: Secondary | ICD-10-CM | POA: Diagnosis not present

## 2020-06-27 DIAGNOSIS — Z880 Allergy status to penicillin: Secondary | ICD-10-CM | POA: Diagnosis not present

## 2020-06-27 DIAGNOSIS — Z01812 Encounter for preprocedural laboratory examination: Secondary | ICD-10-CM | POA: Diagnosis not present

## 2020-06-27 DIAGNOSIS — Z87891 Personal history of nicotine dependence: Secondary | ICD-10-CM | POA: Diagnosis not present

## 2020-06-27 DIAGNOSIS — N476 Balanoposthitis: Secondary | ICD-10-CM | POA: Diagnosis not present

## 2020-06-27 DIAGNOSIS — Z888 Allergy status to other drugs, medicaments and biological substances status: Secondary | ICD-10-CM | POA: Diagnosis not present

## 2020-06-27 DIAGNOSIS — N481 Balanitis: Secondary | ICD-10-CM | POA: Diagnosis not present

## 2020-06-27 DIAGNOSIS — I2510 Atherosclerotic heart disease of native coronary artery without angina pectoris: Secondary | ICD-10-CM | POA: Diagnosis not present

## 2020-06-27 DIAGNOSIS — N471 Phimosis: Secondary | ICD-10-CM | POA: Insufficient documentation

## 2020-06-27 DIAGNOSIS — I110 Hypertensive heart disease with heart failure: Secondary | ICD-10-CM | POA: Diagnosis not present

## 2020-06-27 DIAGNOSIS — N183 Chronic kidney disease, stage 3 unspecified: Secondary | ICD-10-CM | POA: Diagnosis not present

## 2020-06-27 DIAGNOSIS — E1122 Type 2 diabetes mellitus with diabetic chronic kidney disease: Secondary | ICD-10-CM | POA: Diagnosis not present

## 2020-06-27 HISTORY — PX: CIRCUMCISION: SHX1350

## 2020-06-27 HISTORY — DX: Phimosis: N47.1

## 2020-06-27 HISTORY — DX: Other complications of anesthesia, initial encounter: T88.59XA

## 2020-06-27 LAB — POCT I-STAT, CHEM 8
BUN: 22 mg/dL (ref 8–23)
Calcium, Ion: 1.16 mmol/L (ref 1.15–1.40)
Chloride: 106 mmol/L (ref 98–111)
Creatinine, Ser: 1.3 mg/dL — ABNORMAL HIGH (ref 0.61–1.24)
Glucose, Bld: 134 mg/dL — ABNORMAL HIGH (ref 70–99)
HCT: 41 % (ref 39.0–52.0)
Hemoglobin: 13.9 g/dL (ref 13.0–17.0)
Potassium: 4.8 mmol/L (ref 3.5–5.1)
Sodium: 142 mmol/L (ref 135–145)
TCO2: 24 mmol/L (ref 22–32)

## 2020-06-27 LAB — GLUCOSE, CAPILLARY: Glucose-Capillary: 126 mg/dL — ABNORMAL HIGH (ref 70–99)

## 2020-06-27 SURGERY — CIRCUMCISION, ADULT
Anesthesia: General | Site: Penis

## 2020-06-27 MED ORDER — DEXAMETHASONE SODIUM PHOSPHATE 10 MG/ML IJ SOLN
INTRAMUSCULAR | Status: DC | PRN
  Administered 2020-06-27: 5 mg via INTRAVENOUS

## 2020-06-27 MED ORDER — BACITRACIN ZINC 500 UNIT/GM EX OINT
TOPICAL_OINTMENT | CUTANEOUS | Status: DC | PRN
  Administered 2020-06-27: 1 via TOPICAL

## 2020-06-27 MED ORDER — ONDANSETRON HCL 4 MG/2ML IJ SOLN: 4.0000 mg | Freq: Once | INTRAMUSCULAR | Status: DC | PRN

## 2020-06-27 MED ORDER — EPHEDRINE SULFATE-NACL 50-0.9 MG/10ML-% IV SOSY
PREFILLED_SYRINGE | INTRAVENOUS | Status: DC | PRN
  Administered 2020-06-27: 5 mg via INTRAVENOUS

## 2020-06-27 MED ORDER — HYDROCODONE-ACETAMINOPHEN 5-325 MG PO TABS: 1.0000 | ORAL_TABLET | ORAL | 0 refills | Status: DC | PRN

## 2020-06-27 MED ORDER — LIDOCAINE 2% (20 MG/ML) 5 ML SYRINGE
INTRAMUSCULAR | Status: DC | PRN
  Administered 2020-06-27: 100 mg via INTRAVENOUS

## 2020-06-27 MED ORDER — BUPIVACAINE HCL 0.5 % IJ SOLN
INTRAMUSCULAR | Status: DC | PRN
  Administered 2020-06-27: 7 mL

## 2020-06-27 MED ORDER — PHENYLEPHRINE 40 MCG/ML (10ML) SYRINGE FOR IV PUSH (FOR BLOOD PRESSURE SUPPORT)
PREFILLED_SYRINGE | INTRAVENOUS | Status: AC
  Filled 2020-06-27: qty 20

## 2020-06-27 MED ORDER — FENTANYL CITRATE (PF) 100 MCG/2ML IJ SOLN: 25.0000 ug | INTRAMUSCULAR | Status: DC | PRN

## 2020-06-27 MED ORDER — ACETAMINOPHEN 10 MG/ML IV SOLN: 1000.0000 mg | Freq: Once | INTRAVENOUS | Status: DC | PRN

## 2020-06-27 MED ORDER — CLINDAMYCIN PHOSPHATE 900 MG/50ML IV SOLN
900.0000 mg | Freq: Once | INTRAVENOUS | Status: AC
  Administered 2020-06-27: 900 mg via INTRAVENOUS

## 2020-06-27 MED ORDER — SODIUM CHLORIDE 0.9 % IV SOLN: INTRAVENOUS | Status: DC

## 2020-06-27 MED ORDER — DEXAMETHASONE SODIUM PHOSPHATE 10 MG/ML IJ SOLN
INTRAMUSCULAR | Status: AC
  Filled 2020-06-27: qty 1

## 2020-06-27 MED ORDER — DIPHENHYDRAMINE HCL 50 MG/ML IJ SOLN
INTRAMUSCULAR | Status: DC | PRN
  Administered 2020-06-27: 12.5 mg via INTRAVENOUS

## 2020-06-27 MED ORDER — DIPHENHYDRAMINE HCL 50 MG/ML IJ SOLN
INTRAMUSCULAR | Status: AC
  Filled 2020-06-27: qty 1

## 2020-06-27 MED ORDER — FENTANYL CITRATE (PF) 100 MCG/2ML IJ SOLN
INTRAMUSCULAR | Status: AC
  Filled 2020-06-27: qty 2

## 2020-06-27 MED ORDER — CLINDAMYCIN PHOSPHATE 900 MG/50ML IV SOLN
INTRAVENOUS | Status: AC
  Filled 2020-06-27: qty 50

## 2020-06-27 MED ORDER — PHENYLEPHRINE 40 MCG/ML (10ML) SYRINGE FOR IV PUSH (FOR BLOOD PRESSURE SUPPORT)
PREFILLED_SYRINGE | INTRAVENOUS | Status: AC
  Filled 2020-06-27: qty 10

## 2020-06-27 MED ORDER — FENTANYL CITRATE (PF) 100 MCG/2ML IJ SOLN
INTRAMUSCULAR | Status: DC | PRN
  Administered 2020-06-27 (×2): 50 ug via INTRAVENOUS

## 2020-06-27 MED ORDER — PROPOFOL 10 MG/ML IV BOLUS
INTRAVENOUS | Status: DC | PRN
  Administered 2020-06-27: 130 mg via INTRAVENOUS

## 2020-06-27 MED ORDER — PHENYLEPHRINE 40 MCG/ML (10ML) SYRINGE FOR IV PUSH (FOR BLOOD PRESSURE SUPPORT)
PREFILLED_SYRINGE | INTRAVENOUS | Status: DC | PRN
  Administered 2020-06-27: 120 ug via INTRAVENOUS
  Administered 2020-06-27 (×4): 80 ug via INTRAVENOUS
  Administered 2020-06-27: 120 ug via INTRAVENOUS
  Administered 2020-06-27: 80 ug via INTRAVENOUS
  Administered 2020-06-27 (×2): 120 ug via INTRAVENOUS

## 2020-06-27 MED ORDER — EPHEDRINE 5 MG/ML INJ
INTRAVENOUS | Status: AC
  Filled 2020-06-27: qty 10

## 2020-06-27 SURGICAL SUPPLY — 36 items
BLADE SURG 15 STRL LF DISP TIS (BLADE) ×1 IMPLANT
BLADE SURG 15 STRL SS (BLADE) ×3
BNDG COHESIVE 2X5 TAN STRL LF (GAUZE/BANDAGES/DRESSINGS) ×3 IMPLANT
BNDG CONFORM 2 STRL LF (GAUZE/BANDAGES/DRESSINGS) ×3 IMPLANT
COVER BACK TABLE 60X90IN (DRAPES) ×3 IMPLANT
COVER MAYO STAND STRL (DRAPES) ×3 IMPLANT
COVER WAND RF STERILE (DRAPES) ×3 IMPLANT
DECANTER SPIKE VIAL GLASS SM (MISCELLANEOUS) IMPLANT
DRAPE LAPAROTOMY 100X72 PEDS (DRAPES) ×3 IMPLANT
ELECT NEEDLE TIP 2.8 STRL (NEEDLE) ×3 IMPLANT
ELECT REM PT RETURN 9FT ADLT (ELECTROSURGICAL) ×3
ELECTRODE REM PT RTRN 9FT ADLT (ELECTROSURGICAL) ×1 IMPLANT
GAUZE PETROLATUM 1 X8 (GAUZE/BANDAGES/DRESSINGS) IMPLANT
GLOVE BIO SURGEON STRL SZ 6.5 (GLOVE) ×2 IMPLANT
GLOVE BIO SURGEONS STRL SZ 6.5 (GLOVE) ×1
GLOVE BIOGEL M STRL SZ7.5 (GLOVE) ×3 IMPLANT
GLOVE BIOGEL PI IND STRL 7.0 (GLOVE) ×1 IMPLANT
GLOVE BIOGEL PI IND STRL 7.5 (GLOVE) ×1 IMPLANT
GLOVE BIOGEL PI INDICATOR 7.0 (GLOVE) ×2
GLOVE BIOGEL PI INDICATOR 7.5 (GLOVE) ×2
GLOVE SURG SS PI 7.0 STRL IVOR (GLOVE) ×3 IMPLANT
GOWN STRL REUS W/ TWL XL LVL3 (GOWN DISPOSABLE) ×1 IMPLANT
GOWN STRL REUS W/TWL LRG LVL3 (GOWN DISPOSABLE) ×3 IMPLANT
GOWN STRL REUS W/TWL XL LVL3 (GOWN DISPOSABLE) ×3
KIT TURNOVER CYSTO (KITS) ×3 IMPLANT
MANIFOLD NEPTUNE II (INSTRUMENTS) ×3 IMPLANT
NEEDLE HYPO 25X1 1.5 SAFETY (NEEDLE) ×3 IMPLANT
NS IRRIG 500ML POUR BTL (IV SOLUTION) ×3 IMPLANT
PACK BASIN DAY SURGERY FS (CUSTOM PROCEDURE TRAY) ×3 IMPLANT
PENCIL SMOKE EVACUATOR (MISCELLANEOUS) ×3 IMPLANT
SUT CHROMIC 3 0 SH 27 (SUTURE) ×6 IMPLANT
SUT VIC AB 4-0 PS2 18 (SUTURE) IMPLANT
SYR CONTROL 10ML LL (SYRINGE) ×3 IMPLANT
TOWEL OR 17X26 10 PK STRL BLUE (TOWEL DISPOSABLE) ×3 IMPLANT
TRAY DSU PREP LF (CUSTOM PROCEDURE TRAY) ×3 IMPLANT
WATER STERILE IRR 500ML POUR (IV SOLUTION) IMPLANT

## 2020-06-27 NOTE — H&P (Signed)
Urology Preoperative H&P   Chief Complaint: Phimosis  History of Present Illness: Antonio Wells is a 74 y.o. male with a history of phimosis and chronic balanitis, despite multiple topical therapies.  He is here today for circumcision.  He denies interval skin infections, UTIs, dysuria or hematuria.    Past Medical History:  Diagnosis Date  . Arthritis    "knees" (07/11/2017)  . CKD (chronic kidney disease), stage III ([place])    lov dr Thurmond Butts [place] 04-23-2020 on chart  . Complication of anesthesia    post op afibe after cabg x 2 2018  . Coronary artery disease    a.  CABGx3 in 05/2017.  Marland Kitchen Gout   . Heart murmur    mild, saw dr Lovena Le 03-26-2020 f/u prn  . High cholesterol   . Hypertension   . Mild aortic stenosis   . Mild mitral regurgitation   . OSA on CPAP    last used cpap 3 weeks ago  . Phimosis   . Postoperative atrial fibrillation ([place]) 06/2017  . Sleep apnea   . Type II diabetes mellitus ([place])     Past Surgical History:  Procedure Laterality Date  . CARDIAC CATHETERIZATION    . COLONOSCOPY  2008  . CORONARY ARTERY BYPASS GRAFT N/A 06/24/2017   Procedure: CORONARY ARTERY BYPASS GRAFTING (CABG) x 3 ; Using Left Internal Mammary Artery and Right Great Saphenous Leg Vein Harvested Endoscopically;  Surgeon: Gaye Pollack, MD;  Location: [facility];  Service: Open Heart Surgery;  Laterality: N/A;  . LEFT HEART CATH AND CORONARY ANGIOGRAPHY N/A 05/25/2017   Procedure: LEFT HEART CATH AND CORONARY ANGIOGRAPHY;  Surgeon: Wellington Hampshire, MD;  Location: [facility];  Service: Cardiovascular;  Laterality: N/A;  . TEE WITHOUT CARDIOVERSION N/A 06/24/2017   Procedure: TRANSESOPHAGEAL ECHOCARDIOGRAM (TEE);  Surgeon: Gaye Pollack, MD;  Location: [facility];  Service: Open Heart Surgery;  Laterality: N/A;    Allergies:  Allergies  Allergen Reactions  . Penicillins Other (See Comments)    Has patient had a PCN reaction causing immediate rash, facial/tongue/throat swelling,  SOB or lightheadedness with hypotension: No Has patient had a PCN reaction causing severe rash involving mucus membranes or skin necrosis: No Has patient had a PCN reaction that required hospitalization: No Has patient had a PCN reaction occurring within the last 10 years: No If all of the above answers are "NO", then may proceed with Cephalosporin use.   Passed out ? SYNCOPE ?  Marland Kitchen Lisinopril Cough  . Amiodarone Nausea And Vomiting  . Amlodipine Other (See Comments)    Causes dizzines  . Beta Adrenergic Blockers     Do not take beta blockers causes heart block with coreg  . Oxycodone Other (See Comments)    hallucinations  . Zofran [Ondansetron Hcl] Other (See Comments)    Has an interaction with another medication pt takes   . Cortisone Nausea And Vomiting and Other (See Comments)    HICCUPS  . Tramadol Nausea And Vomiting and Other (See Comments)    HICCUPS    Family History  Problem Relation Age of Onset  . Hypertension Mother   . Kidney disease Mother   . Diabetes Mother   . Hypertension Father   . Cancer Brother        lung  . Colon cancer Brother   . Stroke Brother   . Esophageal cancer Neg Hx   . Rectal cancer Neg Hx   . Stomach cancer Neg Hx  Social History:  reports that he quit smoking about 21 years ago. His smoking use included cigarettes. He has a 30.00 pack-year smoking history. He has never used smokeless tobacco. He reports that he does not drink alcohol and does not use drugs.  ROS: A complete review of systems was performed.  All systems are negative except for pertinent findings as noted.  Physical Exam:  Vital signs in last 24 hours: Temp:  [97.8 F (36.6 C)] 97.8 F (36.6 C) (12/03 1108) Pulse Rate:  [97] 97 (12/03 1108) Resp:  [16] 16 (12/03 1108) BP: (164)/(89) 164/89 (12/03 1108) SpO2:  [99 %] 99 % (12/03 1108) Weight:  [82.1 kg] 82.1 kg (12/03 1108) Constitutional:  Alert and oriented, No acute distress Cardiovascular: Regular rate  and rhythm, No JVD Respiratory: Normal respiratory effort, Lungs clear bilaterally GI: Abdomen is soft, nontender, nondistended, no abdominal masses GU: Phimosis of the penile foreskin Lymphatic: No lymphadenopathy Neurologic: Grossly intact, no focal deficits Psychiatric: Normal mood and affect  Laboratory Data:  Recent Labs    06/27/20 1123  HGB 13.9  HCT 41.0    Recent Labs    06/27/20 1123  NA 142  K 4.8  CL 106  GLUCOSE 134*  BUN 22  CREATININE 1.30*     Results for orders placed or performed during the hospital encounter of 06/27/20 (from the past 24 hour(s))  I-STAT, chem 8     Status: Abnormal   Collection Time: 06/27/20 11:23 AM  Result Value Ref Range   Sodium 142 135 - 145 mmol/L   Potassium 4.8 3.5 - 5.1 mmol/L   Chloride 106 98 - 111 mmol/L   BUN 22 8 - 23 mg/dL   Creatinine, Ser 1.30 (H) 0.61 - 1.24 mg/dL   Glucose, Bld 134 (H) 70 - 99 mg/dL   Calcium, Ion 1.16 1.15 - 1.40 mmol/L   TCO2 24 22 - 32 mmol/L   Hemoglobin 13.9 13.0 - 17.0 g/dL   HCT 41.0 39 - 52 %   Recent Results (from the past 240 hour(s))  SARS CORONAVIRUS 2 (TAT 6-24 HRS) Nasopharyngeal Nasopharyngeal Swab     Status: None   Collection Time: 06/25/20 10:10 AM   Specimen: Nasopharyngeal Swab  Result Value Ref Range Status   SARS Coronavirus 2 NEGATIVE NEGATIVE Final    Comment: (NOTE) SARS-CoV-2 target nucleic acids are NOT DETECTED.  The SARS-CoV-2 RNA is generally detectable in upper and lower respiratory specimens during the acute phase of infection. Negative results do not preclude SARS-CoV-2 infection, do not rule out co-infections with other pathogens, and should not be used as the sole basis for treatment or other patient management decisions. Negative results must be combined with clinical observations, patient history, and epidemiological information. The expected result is Negative.  Fact Sheet for Patients: SugarRoll.be  Fact Sheet for  Healthcare Providers: https://www.woods-mathews.com/  This test is not yet approved or cleared by the [place] FDA and  has been authorized for detection and/or diagnosis of SARS-CoV-2 by FDA under an Emergency Use Authorization (EUA). This EUA will remain  in effect (meaning this test can be used) for the duration of the COVID-19 declaration under Se ction 564(b)(1) of the Act, 21 U.S.C. section 360bbb-3(b)(1), unless the authorization is terminated or revoked sooner.  Performed at [facility], [place] [address] [place], [place] [address]     Renal Function: Recent Labs    06/27/20 1123  CREATININE 1.30*   Estimated Creatinine Clearance: 48 mL/min (A) (by  C-G formula based on SCr of 1.3 mg/dL (H)).  Radiologic Imaging: No results found.  I independently reviewed the above imaging studies.  Assessment and Plan Antonio Wells is a 74 y.o. male with phimosis of the penile foreskin and chronic balanitis  The risk, benefits and alternatives of penile circumcision was discussed in detail.  Risks include, but are not limited to, bleeding, skin infection, pain, MI, CVA, PE and the inherent risk of general anesthesia.  He voices understanding and wishes to proceed.  Ellison Hughs, MD 06/27/2020, 12:32 PM  [facility] Pager: [phone]

## 2020-06-27 NOTE — Transfer of Care (Signed)
Immediate Anesthesia Transfer of Care Note  Patient: LAMAJ METOYER  Procedure(s) Performed: CIRCUMCISION ADULT (N/A Penis)  Patient Location: PACU  Anesthesia Type:General  Level of Consciousness: drowsy and patient cooperative  Airway & Oxygen Therapy: Patient Spontanous Breathing and Patient connected to face mask oxygen  Post-op Assessment: Report given to RN and Post -op Vital signs reviewed and stable  Post vital signs: Reviewed and stable  Last Vitals:  Vitals Value Taken Time  BP 126/82 06/27/20 1331  Temp    Pulse 97 06/27/20 1335  Resp 17 06/27/20 1335  SpO2 100 % 06/27/20 1335  Vitals shown include unvalidated device data.  Last Pain:  Vitals:   06/27/20 1108  TempSrc: Oral  PainSc: 0-No pain      Patients Stated Pain Goal: 2 (89/02/28 4069)  Complications: No complications documented.

## 2020-06-27 NOTE — Anesthesia Procedure Notes (Addendum)
Procedure Name: LMA Insertion Date/Time: 06/27/2020 12:44 PM Performed by: Gwyndolyn Saxon, CRNA Pre-anesthesia Checklist: Patient identified, Emergency Drugs available, Suction available and Patient being monitored Patient Re-evaluated:Patient Re-evaluated prior to induction Oxygen Delivery Method: Circle system utilized Preoxygenation: Pre-oxygenation with 100% oxygen Induction Type: IV induction Ventilation: Mask ventilation without difficulty and Oral airway inserted - appropriate to patient size LMA: LMA inserted LMA Size: 4.0 Number of attempts: 1 Airway Equipment and Method: Patient positioned with wedge pillow Placement Confirmation: positive ETCO2 and breath sounds checked- equal and bilateral Dental Injury: Teeth and Oropharynx as per pre-operative assessment

## 2020-06-27 NOTE — Discharge Instructions (Signed)

## 2020-06-27 NOTE — Op Note (Signed)
Operative Note  Preoperative diagnosis:  1.  Phimosis 2.  Chronic balanitis  Postoperative diagnosis: Same  Procedure(s): 1.  Circumcision  Surgeon: Ellison Hughs, MD  Assistants:  None  Anesthesia:  General  Complications:  None  EBL: 5 mL  Specimens: 1.  Foreskin  Drains/Catheters: 1.  None  Intraoperative findings:   1. Phimosis of the penile foreskin  Indication:  Antonio Wells is a 74 y.o. male with chronic balanitis due to phimosis.  He has tried multiple topical therapies without improvement in his condition.  He has been consented for the above procedures, voices understanding and wishes to proceed.  Description of procedure:  After informed consent was obtained, the patient was brought to the operating room and general LMA anesthesia was administered.  The patient was prepped and draped in the usual fashion.  A timeout was then performed.  A 4-0 Prolene was then placed on the dorsal aspect of the glans to assist with retraction.  Circumferential marks were then made with the first being with the foreskin in the anatomic position along the glandular impression on the second being with the foreskin retracted approximately 1 cm from the coronal sulcus.  The marks were then incised using electrocautery and the excess foreskin was excised.  The penile shaft skin was then reapproximated using interrupted 3-0 chromic suture.  A penile ring block was then performed using quarter percent Marcaine without epinephrine.  The penis was then dressed in the usual fashion.  The patient tolerated the procedure well and was transferred to the postanesthesia unit in stable condition.   Plan: Remove dressing in 24 hours.  Follow-up in 2 weeks for a wound check

## 2020-06-27 NOTE — Anesthesia Postprocedure Evaluation (Signed)
Anesthesia Post Note  Patient: Antonio Wells  Procedure(s) Performed: CIRCUMCISION ADULT (N/A Penis)     Patient location during evaluation: PACU Anesthesia Type: General Level of consciousness: awake and alert Pain management: pain level controlled Vital Signs Assessment: post-procedure vital signs reviewed and stable Respiratory status: spontaneous breathing, nonlabored ventilation, respiratory function stable and patient connected to nasal cannula oxygen Cardiovascular status: blood pressure returned to baseline and stable Postop Assessment: no apparent nausea or vomiting Anesthetic complications: no   No complications documented.  Last Vitals:  Vitals:   06/27/20 1345 06/27/20 1400  BP: 138/75 131/78  Pulse: 86 85  Resp: (!) 21 11  Temp:    SpO2: 100% 95%    Last Pain:  Vitals:   06/27/20 1415  TempSrc:   PainSc: 0-No pain                 Barnet Glasgow

## 2020-06-30 ENCOUNTER — Encounter (HOSPITAL_BASED_OUTPATIENT_CLINIC_OR_DEPARTMENT_OTHER): Payer: Self-pay | Admitting: Urology

## 2020-07-14 DIAGNOSIS — N471 Phimosis: Secondary | ICD-10-CM | POA: Diagnosis not present

## 2020-07-14 DIAGNOSIS — N476 Balanoposthitis: Secondary | ICD-10-CM | POA: Diagnosis not present

## 2020-08-08 ENCOUNTER — Other Ambulatory Visit: Payer: Self-pay | Admitting: Family Medicine

## 2020-08-24 ENCOUNTER — Other Ambulatory Visit: Payer: Self-pay | Admitting: Family Medicine

## 2020-09-09 ENCOUNTER — Encounter: Payer: Self-pay | Admitting: Gastroenterology

## 2020-09-29 DIAGNOSIS — N183 Chronic kidney disease, stage 3 unspecified: Secondary | ICD-10-CM | POA: Diagnosis not present

## 2020-10-09 DIAGNOSIS — I129 Hypertensive chronic kidney disease with stage 1 through stage 4 chronic kidney disease, or unspecified chronic kidney disease: Secondary | ICD-10-CM | POA: Diagnosis not present

## 2020-10-09 DIAGNOSIS — N183 Chronic kidney disease, stage 3 unspecified: Secondary | ICD-10-CM | POA: Diagnosis not present

## 2020-10-09 DIAGNOSIS — R809 Proteinuria, unspecified: Secondary | ICD-10-CM | POA: Diagnosis not present

## 2020-10-13 DIAGNOSIS — N476 Balanoposthitis: Secondary | ICD-10-CM | POA: Diagnosis not present

## 2020-11-04 ENCOUNTER — Other Ambulatory Visit: Payer: Self-pay | Admitting: Cardiology

## 2020-11-04 DIAGNOSIS — E785 Hyperlipidemia, unspecified: Secondary | ICD-10-CM

## 2020-11-04 DIAGNOSIS — I10 Essential (primary) hypertension: Secondary | ICD-10-CM

## 2020-11-04 DIAGNOSIS — Z951 Presence of aortocoronary bypass graft: Secondary | ICD-10-CM

## 2020-11-04 DIAGNOSIS — I5032 Chronic diastolic (congestive) heart failure: Secondary | ICD-10-CM

## 2020-11-21 ENCOUNTER — Other Ambulatory Visit: Payer: Self-pay | Admitting: Family Medicine

## 2020-11-21 ENCOUNTER — Other Ambulatory Visit: Payer: Self-pay | Admitting: Cardiology

## 2020-11-21 DIAGNOSIS — E1122 Type 2 diabetes mellitus with diabetic chronic kidney disease: Secondary | ICD-10-CM

## 2020-11-26 ENCOUNTER — Ambulatory Visit: Payer: Medicare Other | Admitting: Family Medicine

## 2020-11-28 ENCOUNTER — Ambulatory Visit: Payer: Self-pay | Admitting: Family Medicine

## 2020-12-02 ENCOUNTER — Ambulatory Visit (AMBULATORY_SURGERY_CENTER): Payer: Self-pay | Admitting: *Deleted

## 2020-12-02 ENCOUNTER — Other Ambulatory Visit: Payer: Self-pay

## 2020-12-02 VITALS — Ht 63.0 in | Wt 180.0 lb

## 2020-12-02 DIAGNOSIS — Z86010 Personal history of colonic polyps: Secondary | ICD-10-CM

## 2020-12-02 DIAGNOSIS — Z800 Family history of malignant neoplasm of digestive organs: Secondary | ICD-10-CM

## 2020-12-02 MED ORDER — NA SULFATE-K SULFATE-MG SULF 17.5-3.13-1.6 GM/177ML PO SOLN: ORAL | 0 refills | Status: DC

## 2020-12-02 NOTE — Progress Notes (Signed)
Patient is here in-person for PV. Patient denies any allergies to eggs or soy. Patient denies any problems with anesthesia/sedation. Patient denies any oxygen use at home. Patient denies taking any diet/weight loss medications or blood thinners. Patient is not being treated for MRSA or C-diff. Patient is aware of our care-partner policy and IRJJO-84 safety protocol. EMMI education assigned to the patient for the procedure, sent to [facility]. Pt denies any constipation.   Patient is COVID-19 vaccinated, per patient.

## 2020-12-11 ENCOUNTER — Encounter: Payer: Self-pay | Admitting: Gastroenterology

## 2020-12-16 ENCOUNTER — Ambulatory Visit (AMBULATORY_SURGERY_CENTER): Payer: Medicare Other | Admitting: Gastroenterology

## 2020-12-16 ENCOUNTER — Other Ambulatory Visit: Payer: Self-pay

## 2020-12-16 ENCOUNTER — Encounter: Payer: Self-pay | Admitting: Gastroenterology

## 2020-12-16 VITALS — BP 130/72 | HR 77 | Temp 96.9°F | Resp 20 | Ht 63.0 in | Wt 180.0 lb

## 2020-12-16 DIAGNOSIS — K641 Second degree hemorrhoids: Secondary | ICD-10-CM

## 2020-12-16 DIAGNOSIS — Z800 Family history of malignant neoplasm of digestive organs: Secondary | ICD-10-CM

## 2020-12-16 DIAGNOSIS — D120 Benign neoplasm of cecum: Secondary | ICD-10-CM

## 2020-12-16 DIAGNOSIS — D123 Benign neoplasm of transverse colon: Secondary | ICD-10-CM

## 2020-12-16 DIAGNOSIS — D128 Benign neoplasm of rectum: Secondary | ICD-10-CM | POA: Diagnosis not present

## 2020-12-16 DIAGNOSIS — D122 Benign neoplasm of ascending colon: Secondary | ICD-10-CM | POA: Diagnosis not present

## 2020-12-16 DIAGNOSIS — Z86010 Personal history of colonic polyps: Secondary | ICD-10-CM | POA: Diagnosis not present

## 2020-12-16 DIAGNOSIS — D125 Benign neoplasm of sigmoid colon: Secondary | ICD-10-CM

## 2020-12-16 DIAGNOSIS — K5730 Diverticulosis of large intestine without perforation or abscess without bleeding: Secondary | ICD-10-CM

## 2020-12-16 DIAGNOSIS — D127 Benign neoplasm of rectosigmoid junction: Secondary | ICD-10-CM | POA: Diagnosis not present

## 2020-12-16 MED ORDER — SODIUM CHLORIDE 0.9 % IV SOLN: 500.0000 mL | Freq: Once | INTRAVENOUS | Status: DC

## 2020-12-16 NOTE — Progress Notes (Signed)
Pt's states no medical or surgical changes since previsit or office visit. 

## 2020-12-16 NOTE — Progress Notes (Signed)
pt tolerated well. VSS. awake and to recovery. Report given to RN.  

## 2020-12-16 NOTE — Patient Instructions (Signed)
Handouts given for polyps, high fiber diet, diverticulosis, hemorrhoids and hemorrhoid banding.  YOU HAD AN ENDOSCOPIC PROCEDURE TODAY AT [facility]:   Refer to the procedure report that was given to you for any specific questions about what was found during the examination.  If the procedure report does not answer your questions, please call your gastroenterologist to clarify.  If you requested that your care partner not be given the details of your procedure findings, then the procedure report has been included in a sealed envelope for you to review at your convenience later.  YOU SHOULD EXPECT: Some feelings of bloating in the abdomen. Passage of more gas than usual.  Walking can help get rid of the air that was put into your GI tract during the procedure and reduce the bloating. If you had a lower endoscopy (such as a colonoscopy or flexible sigmoidoscopy) you may notice spotting of blood in your stool or on the toilet paper. If you underwent a bowel prep for your procedure, you may not have a normal bowel movement for a few days.  Please Note:  You might notice some irritation and congestion in your nose or some drainage.  This is from the oxygen used during your procedure.  There is no need for concern and it should clear up in a day or so.  SYMPTOMS TO REPORT IMMEDIATELY:   Following lower endoscopy (colonoscopy or flexible sigmoidoscopy):  Excessive amounts of blood in the stool  Significant tenderness or worsening of abdominal pains  Swelling of the abdomen that is new, acute  Fever of 100F or higher  For urgent or emergent issues, a gastroenterologist can be reached at any hour by calling [phone]. Do not use MyChart messaging for urgent concerns.    DIET:  We do recommend a small meal at first, but then you may proceed to your regular diet.  Drink plenty of fluids but you should avoid alcoholic beverages for 24 hours.  ACTIVITY:  You should plan to take it  easy for the rest of today and you should NOT DRIVE or use heavy machinery until tomorrow (because of the sedation medicines used during the test).    FOLLOW UP: Our staff will call the number listed on your records 48-72 hours following your procedure to check on you and address any questions or concerns that you may have regarding the information given to you following your procedure. If we do not reach you, we will leave a message.  We will attempt to reach you two times.  During this call, we will ask if you have developed any symptoms of COVID 19. If you develop any symptoms (ie: fever, flu-like symptoms, shortness of breath, cough etc.) before then, please call [phone].  If you test positive for Covid 19 in the 2 weeks post procedure, please call and report this information to [place].    If any biopsies were taken you will be contacted by phone or by letter within the next 1-3 weeks.  Please call us at [phone] if you have not heard about the biopsies in 3 weeks.    SIGNATURES/CONFIDENTIALITY: You and/or your care partner have signed paperwork which will be entered into your electronic medical record.  These signatures attest to the fact that that the information above on your After Visit Summary has been reviewed and is understood.  Full responsibility of the confidentiality of this discharge information lies with you and/or your care-partner.

## 2020-12-16 NOTE — Progress Notes (Signed)
N.[place] vital signs. 

## 2020-12-16 NOTE — Progress Notes (Signed)
Called to room to assist during endoscopic procedure.  Patient ID and intended procedure confirmed with present staff. Received instructions for my participation in the procedure from the performing physician.  

## 2020-12-16 NOTE — Op Note (Signed)
[facility] Patient Name: Antonio Wells Procedure Date: 12/16/2020 8:28 AM MRN: [id number] Endoscopist: Gerrit Heck , MD Age: 75 Referring MD:  Date of Birth: September 27, 1945 Gender: Male Account #: [id number] Procedure:                Colonoscopy Indications:              Surveillance: History of numerous adenomas on last                            colonoscopy (< 3 yrs)                           Family history notable for brother with colon                            cancer diagnsoed age >78.                           Last colonoscopy wasin 05/2018 and notable for 9                            tubular adenomas, with recommendation for short                            interval surveillance. Otherwise, no active GI                            symptoms. Medicines:                Monitored Anesthesia Care Procedure:                Pre-Anesthesia Assessment:                           - Prior to the procedure, a History and Physical                            was performed, and patient medications and                            allergies were reviewed. The patient's tolerance of                            previous anesthesia was also reviewed. The risks                            and benefits of the procedure and the sedation                            options and risks were discussed with the patient.                            All questions were answered, and informed consent                            was obtained.  Prior Anticoagulants: The patient has                            taken no previous anticoagulant or antiplatelet                            agents. ASA Grade Assessment: III - A patient with                            severe systemic disease. After reviewing the risks                            and benefits, the patient was deemed in                            satisfactory condition to undergo the procedure.                           After obtaining informed  consent, the colonoscope                            was passed under direct vision. Throughout the                            procedure, the patient's blood pressure, pulse, and                            oxygen saturations were monitored continuously. The                            Olympus CF-HQ190L (Serial# [id number]) Colonoscope was                            introduced through the anus and advanced to the the                            terminal ileum. The colonoscopy was performed                            without difficulty. The patient tolerated the                            procedure well. The quality of the bowel                            preparation was good. The terminal ileum, ileocecal                            valve, appendiceal orifice, and rectum were                            photographed. Scope In: 8:45:06 AM Scope Out: 9:12:16 AM Scope Withdrawal Time: 0 hours 23 minutes 51 seconds  Total Procedure Duration: 0 hours 27 minutes 10 seconds  Findings:                 The perianal and digital rectal examinations were                            normal.                           Nine sessile polyps were found in the rectum (1),                            sigmoid colon (4), transverse colon (2), ascending                            colon (1), and cecum (1). The polyps were 2 to 5 mm                            in size. These polyps were removed with a cold                            snare. Resection and retrieval were complete.                            Estimated blood loss was minimal.                           Multiple small and large-mouthed diverticula were                            found in the sigmoid colon, descending colon,                            ascending colon and cecum.                           Non-bleeding internal hemorrhoids were found during                            retroflexion. The hemorrhoids were medium-sized and                            Grade II  (internal hemorrhoids that prolapse but                            reduce spontaneously).                           The terminal ileum appeared normal. Complications:            No immediate complications. Estimated Blood Loss:     Estimated blood loss was minimal. Impression:               - Nine 2 to 5 mm polyps in the rectum, in the                            sigmoid  colon, in the transverse colon, in the                            ascending colon and in the cecum, removed with a                            cold snare. Resected and retrieved.                           - Diverticulosis in the sigmoid colon, in the                            descending colon, in the ascending colon and in the                            cecum.                           - Non-bleeding internal hemorrhoids.                           - The examined portion of the ileum was normal. Recommendation:           - Patient has a contact number available for                            emergencies. The signs and symptoms of potential                            delayed complications were discussed with the                            patient. Return to normal activities tomorrow.                            Written discharge instructions were provided to the                            patient.                           - Resume previous diet.                           - Continue present medications.                           - Await pathology results.                           - Repeat colonoscopy in 3 years for surveillance,                            or sooner based on pathology results.                           -  Return to GI office PRN.                           - Use fiber, for example Citrucel, Fibercon, Konsyl                            or Metamucil.                           - Internal hemorrhoids were noted on this study and                            may be amenable to hemorrhoid band ligation. If you                             are interested in further treatment of these                            hemorrhoids with band ligation, please contact my                            clinic to set up an appointment for evaluation and                            treatment. Gerrit Heck, MD 12/16/2020 9:18:52 AM

## 2020-12-18 ENCOUNTER — Telehealth: Payer: Self-pay

## 2020-12-18 NOTE — Telephone Encounter (Signed)
  Follow up Call-  Call back number 12/16/2020 06/13/2018  Post procedure Call Back phone  # [phone]  Permission to leave phone message Yes Yes  Some recent data might be hidden     Patient questions:  Do you have a fever, pain , or abdominal swelling? No. Pain Score  0 *  Have you tolerated food without any problems? Yes.    Have you been able to return to your normal activities? Yes.    Do you have any questions about your discharge instructions: Diet   No. Medications  No. Follow up visit  No.  Do you have questions or concerns about your Care? No.  Actions: * If pain score is 4 or above: No action needed, pain <4.   1. Have you developed a fever since your procedure? No   2.   Have you had an respiratory symptoms (SOB or cough) since your procedure? No   3.   Have you tested positive for COVID 19 since your procedure? no  4.   Have you had any family members/close contacts diagnosed with the COVID 19 since your procedure?  No    If yes to any of these questions please route to Joylene John, RN and Joella Prince, RN

## 2020-12-26 ENCOUNTER — Encounter: Payer: Self-pay | Admitting: Gastroenterology

## 2020-12-29 ENCOUNTER — Encounter: Payer: Self-pay | Admitting: Family Medicine

## 2020-12-29 ENCOUNTER — Ambulatory Visit (INDEPENDENT_AMBULATORY_CARE_PROVIDER_SITE_OTHER): Payer: Medicare Other | Admitting: Family Medicine

## 2020-12-29 ENCOUNTER — Other Ambulatory Visit: Payer: Self-pay

## 2020-12-29 VITALS — BP 132/86 | HR 79 | Temp 98.0°F | Resp 16 | Ht 63.0 in | Wt 177.0 lb

## 2020-12-29 DIAGNOSIS — I2510 Atherosclerotic heart disease of native coronary artery without angina pectoris: Secondary | ICD-10-CM

## 2020-12-29 DIAGNOSIS — E785 Hyperlipidemia, unspecified: Secondary | ICD-10-CM

## 2020-12-29 DIAGNOSIS — E1122 Type 2 diabetes mellitus with diabetic chronic kidney disease: Secondary | ICD-10-CM

## 2020-12-29 DIAGNOSIS — R0600 Dyspnea, unspecified: Secondary | ICD-10-CM | POA: Diagnosis not present

## 2020-12-29 LAB — HEMOGLOBIN A1C: Hgb A1c MFr Bld: 7.3 % — ABNORMAL HIGH (ref 4.6–6.5)

## 2020-12-29 MED ORDER — METFORMIN HCL 500 MG PO TABS: 1.0000 | ORAL_TABLET | Freq: Two times a day (BID) | ORAL | 1 refills | Status: DC

## 2020-12-29 MED ORDER — ATORVASTATIN CALCIUM 80 MG PO TABS: ORAL_TABLET | ORAL | 2 refills | Status: DC

## 2020-12-29 NOTE — Progress Notes (Signed)
Subjective:  Patient ID: Antonio Wells, male    DOB: 1945-07-29  Age: 75 y.o. MRN: [id number]  CC:  Chief Complaint  Patient presents with  . Diabetes    Pt here for refills and check in, no concerns  . Hyperlipidemia    Pt here for f/u and labs   . Hypertension    Refill needed     HPI Antonio Wells presents for   Diabetes: With microalbuminuria, chronic kidney disease, coronary artery disease, followed by nephrology Dr. [name].  Cardiology, He is on statin, ARB, metformin 500mg  BID (has not tolerated higher doses).  History of AV block, followed by cardiology, Dr. Meda Coffee prior - will be getting new cardiologist. .  Antonio Wells have been discussed with cardiac history but history of phimosis and balanitis as well (Dr. Lovena Neighbours with [facility]) - cautious with that group of medications. Home readings fasting: 130 range Home readings postprandial - none.  No symptomatic lows. No new side effects with meds.   Microalbumin: Ratio 43 in July 2021. Optho, foot exam, pneumovax:  optho appt in July.   Exercise- limited recently by knee issues. Has access to pool - has not used.    Lab Results  Component Value Date   HGBA1C 7.1 (H) 05/28/2020   HGBA1C 6.3 (H) 02/22/2020   HGBA1C 6.8 (H) 11/23/2019   Lab Results  Component Value Date   MICROALBUR 39.6 08/25/2015   LDLCALC 65 05/28/2020   CREATININE 1.30 (H) 06/27/2020   Hypertension: Chlorthalidone 12.5mg  qd, hydralazine 50mg  Qam, 100mg  QPM.  imdur 60mg  at night. No chest pains.  Home readings: usually controlled, rare elevation.  Fleeting shortness of breath with activity that resolves quickly, no CP and not like experienced prior to bypass. - hx cad s/p CABG in 2018.  Still able to play saxophone.  BP Readings from Last 3 Encounters:  12/29/20 132/86  12/16/20 130/72  06/27/20 (!) 167/74   Lab Results  Component Value Date   CREATININE 1.30 (H) 06/27/2020     History Patient Active Problem List    Diagnosis Date Noted  . Syncope 02/15/2020  . Bradycardia 02/15/2020  . Hypertensive encephalopathy 10/19/2018  . Chronic kidney disease, stage 3 10/19/2018  . DM (diabetes mellitus), type 2 with renal complications ([place]) 97/58/8325  . Cough 03/17/2018  . Lower resp. tract infection 03/17/2018  . Sleep apnea with use of continuous positive airway pressure (CPAP) 01/04/2018  . Malnutrition of moderate degree 07/14/2017  . Thrush 07/11/2017  . FTT (failure to thrive) in adult 07/11/2017  . 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 CAD (coronary artery disease) 05/25/2017  . Unstable angina ([place])   . Hypertensive heart disease 07/14/2015  . Lower extremity edema 07/14/2015  . Aortic stenosis 01/09/2015  . OSA on CPAP 12/12/2014  . Hypersomnia with sleep apnea 12/12/2014  . Obstructive sleep apnea (adult) (pediatric) 02/04/2014  . 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 Obesity 02/04/2014  . Mitral regurgitation and aortic stenosis 01/02/2014  . DOE (dyspnea on exertion) 11/26/2013  . Gout 08/12/2011  . Hypertension 08/12/2011  . High cholesterol 08/12/2011  . OSA (obstructive sleep apnea) 08/12/2011  . Diabetes type 2, uncontrolled ([place]) 08/12/2011   Past Medical History:  Diagnosis Date  . Arthritis    "knees" (07/11/2017)  . CKD (chronic kidney disease), stage III ([place])    lov dr Thurmond Butts [place] 04-23-2020 on chart  . Complication of anesthesia    post op afibe after cabg x 2 2018  .  Coronary artery disease    a.  CABGx3 in 05/2017.  Marland Kitchen Gout   . Heart murmur    mild, saw dr Lovena Le 03-26-2020 f/u prn  . High cholesterol   . Hypertension   . Mild aortic stenosis   . Mild mitral regurgitation   . OSA on CPAP    last used cpap 3 weeks ago  . Phimosis   . Postoperative atrial fibrillation ([place]) 06/2017  . Sleep apnea    uses CPAP  . Type II diabetes mellitus ([place])    Past Surgical History:  Procedure Laterality Date  . CARDIAC CATHETERIZATION    . CIRCUMCISION N/A 06/27/2020   Procedure:  CIRCUMCISION ADULT;  Surgeon: Ceasar Mons, MD;  Location: [facility];  Service: Urology;  Laterality: N/A;  . COLONOSCOPY  2008  . CORONARY ARTERY BYPASS GRAFT N/A 06/24/2017   Procedure: CORONARY ARTERY BYPASS GRAFTING (CABG) x 3 ; Using Left Internal Mammary Artery and Right Great Saphenous Leg Vein Harvested Endoscopically;  Surgeon: Gaye Pollack, MD;  Location: [place] [facility];  Service: Open Heart Surgery;  Laterality: N/A;  . LEFT HEART CATH AND CORONARY ANGIOGRAPHY N/A 05/25/2017   Procedure: LEFT HEART CATH AND CORONARY ANGIOGRAPHY;  Surgeon: Wellington Hampshire, MD;  Location: [place] [facility];  Service: Cardiovascular;  Laterality: N/A;  . TEE WITHOUT CARDIOVERSION N/A 06/24/2017   Procedure: TRANSESOPHAGEAL ECHOCARDIOGRAM (TEE);  Surgeon: Gaye Pollack, MD;  Location: [place];  Service: Open Heart Surgery;  Laterality: N/A;   Allergies  Allergen Reactions  . Penicillins Other (Wells Comments)    Has patient had a PCN reaction causing immediate rash, facial/tongue/throat swelling, SOB or lightheadedness with hypotension: No Has patient had a PCN reaction causing severe rash involving mucus membranes or skin necrosis: No Has patient had a PCN reaction that required hospitalization: No Has patient had a PCN reaction occurring within the last 10 years: No If all of the above answers are "NO", then may proceed with Cephalosporin use.   Passed out ? SYNCOPE ?  Marland Kitchen Lisinopril Cough  . Amiodarone Nausea And Vomiting  . Amlodipine Other (Wells Comments)    Causes dizzines  . Beta Adrenergic Blockers     Do not take beta blockers causes heart block with coreg  . Oxycodone Other (Wells Comments)    hallucinations  . Zofran [Ondansetron Hcl] Other (Wells Comments)    Has an interaction with another medication pt takes   . Cortisone Nausea And Vomiting and Other (Wells Comments)    HICCUPS  . Tramadol Nausea And Vomiting and Other (Wells Comments)    HICCUPS   Prior to  Admission medications   Medication Sig Start Date End Date Taking? Authorizing Provider  acetaminophen (TYLENOL) 325 MG tablet Take 650 mg by mouth every 6 (six) hours as needed for mild pain.    [provider]  aspirin EC 81 MG tablet Take 1 tablet (81 mg total) by mouth daily. 09/02/17   Dunn, Nedra Hai, PA-C  atorvastatin (LIPITOR) 80 MG tablet TAKE 1 TABLET BY MOUTH  DAILY AT 6 PM 05/28/20   [name], MD  chlorthalidone (HYGROTON) 25 MG tablet Take 12.5 mg by mouth every morning.  09/10/19   [provider]  colchicine 0.6 MG tablet TAKE 2 TABS ON ONSET OF GOUT FLARE THEN REPEAT IN 1 HOUR OF ACUTE GOUT ATTACK 07/06/19   [name], MD  glucose blood test strip Use as instructed 01/21/20   [name]  R, MD  guaiFENesin (MUCINEX) 600 MG 12 hr tablet Take 600 mg by mouth 2 (two) times daily as needed (congestion, cough).     [provider]  hydrALAZINE (APRESOLINE) 50 MG tablet TAKE 1 TABLET BY MOUTH IN  THE MORNING THEN TAKE 2  TABLETS BY MOUTH IN THE  EVENING DAILY. Please make yearly appt for July 2022 with Cardiologist for future refills. Thank you 1st attempt 11/05/20   [name], MD  irbesartan (AVAPRO) 300 MG tablet Take 300 mg by mouth daily.    [provider]  isosorbide mononitrate (IMDUR) 60 MG 24 hr tablet TAKE 1 TABLET BY MOUTH AT  BEDTIME 11/21/20   Allred, Jeneen Rinks, MD  Lancets [facility] ULTRASOFT) lancets 1 each by Other route 1 day or 1 dose for 1 dose. Use as instructed 01/30/20 02/22/20  [name], MD  metFORMIN (GLUCOPHAGE) 500 MG tablet TAKE 1 TABLET BY MOUTH  TWICE DAILY WITH A MEAL 11/24/20   [name], MD   Social History   Socioeconomic History  . Marital status: Married    Spouse name: [name]  . Number of children: 3  . Years of education: 33  . Highest education level: Not on file  Occupational History  . Occupation: Musician  Tobacco Use  . Smoking status: Former Smoker    Packs/day: 2.00     Years: 15.00    Pack years: 30.00    Types: Cigarettes    Quit date: 07/26/1998    Years since quitting: 22.4  . Smokeless tobacco: Never Used  Vaping Use  . Vaping Use: Never used  Substance and Sexual Activity  . Alcohol use: Not Currently    Alcohol/week: 0.0 standard drinks  . Drug use: No  . Sexual activity: Yes  Other Topics Concern  . Not on file  Social History Narrative         Social Determinants of Health   Financial Resource Strain: Not on file  Food Insecurity: Not on file  Transportation Needs: Not on file  Physical Activity: Not on file  Stress: Not on file  Social Connections: Not on file  Intimate Partner Violence: Not on file    Review of Systems  Constitutional: Negative for fatigue and unexpected weight change.  Eyes: Negative for visual disturbance.  Respiratory: Positive for shortness of breath (rare with activity - past few months. ). Negative for cough and chest tightness.   Cardiovascular: Negative for chest pain, palpitations and leg swelling.  Gastrointestinal: Negative for abdominal pain and blood in stool.  Neurological: Negative for dizziness, light-headedness and headaches.     Objective:   Vitals:   12/29/20 1316  BP: 132/86  Pulse: 79  Resp: 16  Temp: 98 F (36.7 C)  TempSrc: Temporal  SpO2: 98%  Weight: 177 lb (80.3 kg)  Height: 5\' 3"  (1.6 m)     Physical Exam Vitals reviewed.  Constitutional:      Appearance: He is well-developed.  HENT:     Head: Normocephalic and atraumatic.  Eyes:     Pupils: Pupils are equal, round, and reactive to light.  Neck:     Vascular: No carotid bruit or JVD.  Cardiovascular:     Rate and Rhythm: Normal rate and regular rhythm.     Heart sounds: Murmur (8-3/4 systolic. ) heard.    Pulmonary:     Effort: Pulmonary effort is normal.     Breath sounds: Normal breath sounds. No rales.  Musculoskeletal:  Right lower leg: No edema.     Left lower leg: No edema.  Skin:    General:  Skin is warm and dry.  Neurological:     Mental Status: He is alert and oriented to person, place, and time.  Psychiatric:        Mood and Affect: Mood normal.        Behavior: Behavior normal.      Assessment & Plan:  Antonio Wells is a 75 y.o. male . Type 2 diabetes mellitus with chronic kidney disease, without long-term current use of insulin, unspecified CKD stage (HCC) - Plan: Hemoglobin A1c, Lipid panel, metFORMIN (GLUCOPHAGE) 500 MG tablet  -Check A1c, continue metformin same dose for now with option of additional med.  If cardiac history would consider SGLT2, but close monitoring for mycotic infections and possible risk discussed.  Coronary artery disease involving native heart without angina pectoris, unspecified vessel or lesion type - Plan: atorvastatin (LIPITOR) 80 MG tablet  -Continue Lipitor, follow-up with cardiology as planned.  Reportedly will be seeing new cardiologist.  Dyspnea, unspecified type  -Reports mild symptoms but recommended he call cardiology to discuss as may need to be seen sooner.  Hyperlipidemia, unspecified hyperlipidemia type - Plan: Comprehensive metabolic panel, Lipid panel  -Continue Lipitor, check labs.  Last LDL was at goal.  Meds ordered this encounter  Medications  . atorvastatin (LIPITOR) 80 MG tablet    Sig: TAKE 1 TABLET BY MOUTH  DAILY AT 6 PM    Dispense:  90 tablet    Refill:  2    Requesting 1 year supply  . metFORMIN (GLUCOPHAGE) 500 MG tablet    Sig: Take 1 tablet (500 mg total) by mouth 2 (two) times daily with a meal.    Dispense:  180 tablet    Refill:  1   Patient Instructions  I would consider water based exercise as less wear on joints/knees.  Depending on A1C, can discuss other meds, but would be cautious with SGLT2 class like Farxiga with risk of fungal infections.   Call your cardiologist about the shortness of breath as they may want to Wells you sooner.  Return to the clinic or go to the nearest emergency room  if any of your symptoms worsen or new symptoms occur.  Thanks for coming in today and take care.         Signed, [name], MD [facility]

## 2020-12-29 NOTE — Patient Instructions (Addendum)
I would consider water based exercise as less wear on joints/knees.  Depending on A1C, can discuss other meds, but would be cautious with SGLT2 class like Farxiga with risk of fungal infections.   Call your cardiologist about the shortness of breath as they may want to see you sooner.  Return to the clinic or go to the nearest emergency room if any of your symptoms worsen or new symptoms occur.  Thanks for coming in today and take care.

## 2020-12-30 LAB — COMPREHENSIVE METABOLIC PANEL
ALT: 14 U/L (ref 0–53)
AST: 18 U/L (ref 0–37)
Albumin: 4.7 g/dL (ref 3.5–5.2)
Alkaline Phosphatase: 58 U/L (ref 39–117)
BUN: 23 mg/dL (ref 6–23)
CO2: 26 mEq/L (ref 19–32)
Calcium: 9.3 mg/dL (ref 8.4–10.5)
Chloride: 103 mEq/L (ref 96–112)
Creatinine, Ser: 1.5 mg/dL (ref 0.40–1.50)
GFR: 45.75 mL/min — ABNORMAL LOW (ref >60.00)
Glucose, Bld: 105 mg/dL — ABNORMAL HIGH (ref 70–99)
Potassium: 4 mEq/L (ref 3.5–5.1)
Sodium: 141 mEq/L (ref 135–145)
Total Bilirubin: 0.4 mg/dL (ref 0.2–1.2)
Total Protein: 7.2 g/dL (ref 6.0–8.3)

## 2020-12-30 LAB — LIPID PANEL
Cholesterol: 154 mg/dL (ref 0–200)
HDL: 61.7 mg/dL (ref >39.00)
LDL Cholesterol: 69 mg/dL (ref 0–99)
NonHDL: 92.08
Total CHOL/HDL Ratio: 2
Triglycerides: 114 mg/dL (ref 0.0–149.0)
VLDL: 22.8 mg/dL (ref 0.0–40.0)

## 2020-12-31 NOTE — Progress Notes (Addendum)
Cardiology Office Note:    Date:  01/01/2021   ID:  Antonio Wells, DOB October 13, 1945, MRN [id number]  PCP:  [name], MD   [facility] Providers Cardiologist:  Ena Dawley, MD (Inactive)     Referring MD: [name], MD   History of Present Illness:    JOHNTAE Wells is a 75 y.o. male is here today to follow-up for aortic stenosis, coronary artery disease, hypertension.  Previous Visit: 03/27/2020 Mr. Hanner is referred by Truitt Merle for evaluation of symptomatic bradycardia. The patient has a h/o HTN, CAD, s/p CABG, HTN, and syncope who was found to have advanced heart block on coreg. His beta blocker was stopped. He has felt well in the interim. He denies additional syncope and minimal palpitations. He notes that his resting HR has been a bit elevated. He denies chest pain, sob, or edema. He admits to being sedentary.  Today he is moderately well. He has not felt the feeling of dizziness since he stopped taking the beta-blocker. Occassionally he is short of breath when going up a flight of stairs or around the house. 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   He reports he is unable to exercise as much because of pain in his right knee. He may need a knee replacement. He denies having any chest pain, tightness, or pressure. He has no lightheadedness, LE edema, syncopal episodes, PND, or orthopnea.   Past Medical History:  Diagnosis Date   Arthritis    "knees" (07/11/2017)   CKD (chronic kidney disease), stage III ([place])    lov dr Thurmond Butts [place] [id number] on chart   Complication of anesthesia    post op afibe after cabg x 2 2018   Coronary artery disease    a.  CABGx3 in 05/2017.   Gout    Heart murmur    mild, saw dr Lovena Le 03-26-2020 f/u prn   High cholesterol    Hypertension    Mild aortic stenosis    Mild mitral regurgitation    OSA on CPAP    last used cpap 3 weeks ago   Phimosis     Postoperative atrial fibrillation ([place]) 06/2017   Sleep apnea    uses CPAP   Type II diabetes mellitus [facility])     Past Surgical History:  Procedure Laterality Date   CARDIAC CATHETERIZATION     CIRCUMCISION N/A 06/27/2020   Procedure: CIRCUMCISION ADULT;  Surgeon: Ceasar Mons, MD;  Location: [facility];  Service: Urology;  Laterality: N/A;   COLONOSCOPY  2008   CORONARY ARTERY BYPASS GRAFT N/A 06/24/2017   Procedure: CORONARY ARTERY BYPASS GRAFTING (CABG) x 3 ; Using Left Internal Mammary Artery and Right Great Saphenous Leg Vein Harvested Endoscopically;  Surgeon: Gaye Pollack, MD;  Location: [place] OR;  Service: Open Heart Surgery;  Laterality: N/A;   LEFT HEART CATH AND CORONARY ANGIOGRAPHY N/A 05/25/2017   Procedure: LEFT HEART CATH AND CORONARY ANGIOGRAPHY;  Surgeon: Wellington Hampshire, MD;  Location: [place] [facility];  Service: Cardiovascular;  Laterality: N/A;   TEE WITHOUT CARDIOVERSION N/A 06/24/2017   Procedure: TRANSESOPHAGEAL ECHOCARDIOGRAM (TEE);  Surgeon: Gaye Pollack, MD;  Location: [place];  Service: Open Heart Surgery;  Laterality: N/A;    Current Medications: Current Meds  Medication Sig   acetaminophen (TYLENOL) 325 MG tablet Take 650 mg by mouth every 6 (six) hours as needed for  mild pain.   aspirin EC 81 MG tablet Take 1 tablet (81 mg total) by mouth daily.   atorvastatin (LIPITOR) 80 MG tablet TAKE 1 TABLET BY MOUTH  DAILY AT 6 PM   chlorthalidone (HYGROTON) 25 MG tablet Take 12.5 mg by mouth every morning.    colchicine 0.6 MG tablet TAKE 2 TABS ON ONSET OF GOUT FLARE THEN REPEAT IN 1 HOUR OF ACUTE GOUT ATTACK   glucose blood test strip Use as instructed   guaiFENesin (MUCINEX) 600 MG 12 hr tablet Take 600 mg by mouth 2 (two) times daily as needed (congestion, cough).    hydrALAZINE (APRESOLINE) 50 MG tablet TAKE 1 TABLET BY MOUTH IN  THE MORNING THEN TAKE 2  TABLETS BY MOUTH IN THE  EVENING DAILY. Please make yearly appt for July 2022  with Cardiologist for future refills. Thank you 1st attempt   irbesartan (AVAPRO) 300 MG tablet Take 300 mg by mouth daily.   isosorbide mononitrate (IMDUR) 60 MG 24 hr tablet TAKE 1 TABLET BY MOUTH AT  BEDTIME   Lancets (ONETOUCH ULTRASOFT) lancets 1 each by Other route 1 day or 1 dose for 1 dose. Use as instructed   metFORMIN (GLUCOPHAGE) 500 MG tablet Take 1 tablet (500 mg total) by mouth 2 (two) times daily with a meal.     Allergies:   Penicillins, Lisinopril, Amiodarone, Amlodipine, Beta adrenergic blockers, Oxycodone, Zofran [ondansetron hcl], Cortisone, and Tramadol   Social History   Socioeconomic History   Marital status: Married    Spouse name: [name]   Number of children: 3   Years of education: 16   Highest education level: Not on file  Occupational History   Occupation: Musician  Tobacco Use   Smoking status: Former    Packs/day: 2.00    Years: 15.00    Pack years: 30.00    Types: Cigarettes    Quit date: 07/26/1998    Years since quitting: 22.4   Smokeless tobacco: Never  Vaping Use   Vaping Use: Never used  Substance and Sexual Activity   Alcohol use: Not Currently    Alcohol/week: 0.0 standard drinks   Drug use: No   Sexual activity: Yes  Other Topics Concern   Not on file  Social History Narrative         Social Determinants of Health   Financial Resource Strain: Not on file  Food Insecurity: Not on file  Transportation Needs: Not on file  Physical Activity: Not on file  Stress: Not on file  Social Connections: Not on file     Family History: The patient's family history includes Cancer in his brother; Colon cancer (age of onset: 44) in his brother; Diabetes in his mother; Hypertension in his father and mother; Kidney disease in his mother; Stroke in his brother. There is no history of Esophageal cancer, Rectal cancer, Stomach cancer, or Colon polyps.  ROS:   Please see the history of present illness. All other systems reviewed and are  negative.  EKGs/Labs/Other Studies Reviewed:    The following studies were reviewed today:  Cardiac telemetry 03/27/2020 Study Highlights Patient had a min HR of 55 bpm, max HR of 145 bpm, and avg HR of 95 bpm. No pauses or arrhythmias were seen. Isolated PACs and PVCs were seen.  Echo 02/16/2020 Impressions 1. Left ventricular ejection fraction, by estimation, is 60 to 65%. The left ventricle has normal function. The left ventricle has no regional wall motion abnormalities. There is mild concentric left ventricular  hypertrophy. Left ventricular diastolic parameters were normal. 2. Right ventricular systolic function is normal. The right ventricular size is normal. There is mildly elevated pulmonary artery systolic pressure. 3. The mitral valve is normal in structure. Mild mitral valve regurgitation. No evidence of mitral stenosis. 4. The aortic valve is tricuspid. Aortic valve regurgitation is not visualized. Mild aortic valve stenosis. 5. The inferior vena cava is normal in size with greater than 50% respiratory variability, suggesting right atrial pressure of 3 mmHg.  DG Chest 02/15/2020 Impressions  No active disease.  CT Angio Neck  10/19/2018 Impressions 1. Negative for emergent large vessel occlusion 2. Severe diffuse atherosclerotic disease 3. Diffuse atherosclerotic disease in the carotid artery bilaterally. 25% stenosis proximal right carotid artery. Moderate stenosis of the cavernous carotid bilaterally. 4. Small and diffusely disease right vertebral artery ends in PICA. Dominant left vertebral artery with moderate stenosis at the origin 5. Severe stenosis superior branch right MCA. 6. These results were called by telephone at the time of interpretation on 10/19/2018 at 3:42 pm to Dr. Roland Rack , who verbally acknowledged these results.  EKG:  EKG shows sinus rhythm, 96 bpm   Recent Labs: 02/15/2020: TSH 4.638 02/16/2020: Platelets 166 06/27/2020:  Hemoglobin 13.9 12/29/2020: ALT 14; BUN 23; Creatinine, Ser 1.50; Potassium 4.0; Sodium 141  Recent Lipid Panel    Component Value Date/Time   CHOL 154 12/29/2020 1432   CHOL 141 05/28/2020 1052   TRIG 114.0 12/29/2020 1432   HDL 61.70 12/29/2020 1432   HDL 61 05/28/2020 1052   CHOLHDL 2 12/29/2020 1432   VLDL 22.8 12/29/2020 1432   LDLCALC 69 12/29/2020 1432   LDLCALC 65 05/28/2020 1052     Risk Assessment/Calculations:      Physical Exam:    VS:  BP 112/80 (BP Location: Right Arm, Patient Position: Sitting, Cuff Size: Normal)   Pulse 96   Ht 5\' 3"  (1.6 m)   Wt 177 lb (80.3 kg)   BMI 31.35 kg/m     Wt Readings from Last 3 Encounters:  01/01/21 177 lb (80.3 kg)  12/29/20 177 lb (80.3 kg)  12/16/20 180 lb (81.6 kg)     GEN:  Well nourished, well developed in no acute distress HEENT: Normal NECK: No JVD; positive radiation carotid bruits LYMPHATICS: No lymphadenopathy CARDIAC: RRR, 2/6 systolic murmur, rubs, gallops RESPIRATORY:  Clear to auscultation without rales, wheezing or rhonchi  ABDOMEN: Soft, non-tender, non-distended MUSCULOSKELETAL:  No edema; No deformity  SKIN: Warm and dry NEUROLOGIC:  Alert and oriented x 3 PSYCHIATRIC:  Normal affect   ASSESSMENT:    1. Coronary artery disease involving native heart without angina pectoris, unspecified vessel or lesion type   2. PAF (paroxysmal atrial fibrillation) (HCC)    PLAN:    In order of problems listed above:  Heart Block Previous episodes of high degree second-degree heart block.  This resolved after stopping beta-blocker.  He is doing well.  No syncopal episodes any further.  Previously saw EP, Dr. Crissie Sickles.  Sinus tachycardia Heart rate today 96 bpm off of beta-blocker.  No worries.  Stable.  Hypertension  -Continue with current medication management with hydralazine 50 mg 2 tablets in the evening, irbesartan 300 mg a day, isosorbide 60 mg a day.  Coronary artery disease  -Former bypass.   Doing very well.  Does feel some mild shortness of breath when going up the stairs.  We will continue to monitor.  Continue with daily exercise.  His right knee limits him.  Thinking about water aerobics.  Diabetes with hypertension - Hemoglobin A1c 7.1.  LDL 65.  1 year follow-up       Medication Adjustments/Labs and Tests Ordered: Current medicines are reviewed at length with the patient today.  Concerns regarding medicines are outlined above.  Orders Placed This Encounter  Procedures   EKG 12-Lead   No orders of the defined types were placed in this encounter.   Patient Instructions  Medication Instructions:  The current medical regimen is effective;  continue present plan and medications.  *If you need a refill on your cardiac medications before your next appointment, please call your pharmacy*  Follow-Up: At [facility], you and your health needs are our priority.  As part of our continuing mission to provide you with exceptional heart care, we have created designated Provider Care Teams.  These Care Teams include your primary Cardiologist (physician) and Advanced Practice Providers (APPs -  Physician Assistants and Nurse Practitioners) who all work together to provide you with the care you need, when you need it.  We recommend signing up for the patient portal called "MyChart".  Sign up information is provided on this After Visit Summary.  MyChart is used to connect with patients for Virtual Visits (Telemedicine).  Patients are able to view lab/test results, encounter notes, upcoming appointments, etc.  Non-urgent messages can be sent to your provider as well.   To learn more about what you can do with MyChart, go to [email or web address].    Your next appointment:   1 year(s)  The format for your next appointment:   In Person  Provider:   Candee Furbish, MD   Thank you for choosing [facility]!!      I,Essence Turner,acting as a scribe for Candee Furbish, MD.,have documented all relevant documentation on the behalf of Candee Furbish, MD,as directed by  Candee Furbish, MD while in the presence of Candee Furbish, MD.   Signed, Candee Furbish, MD  01/01/2021 9:13 AM    [place]

## 2021-01-01 ENCOUNTER — Ambulatory Visit: Payer: Medicare Other | Admitting: Cardiology

## 2021-01-01 ENCOUNTER — Other Ambulatory Visit: Payer: Self-pay

## 2021-01-01 ENCOUNTER — Encounter: Payer: Self-pay | Admitting: Cardiology

## 2021-01-01 VITALS — BP 112/80 | HR 96 | Ht 63.0 in | Wt 177.0 lb

## 2021-01-01 DIAGNOSIS — I2510 Atherosclerotic heart disease of native coronary artery without angina pectoris: Secondary | ICD-10-CM

## 2021-01-01 DIAGNOSIS — I480 Paroxysmal atrial fibrillation: Secondary | ICD-10-CM

## 2021-01-01 NOTE — Patient Instructions (Signed)
Medication Instructions:  The current medical regimen is effective;  continue present plan and medications.  *If you need a refill on your cardiac medications before your next appointment, please call your pharmacy*  Follow-Up: At [facility], you and your health needs are our priority.  As part of our continuing mission to provide you with exceptional heart care, we have created designated Provider Care Teams.  These Care Teams include your primary Cardiologist (physician) and Advanced Practice Providers (APPs -  Physician Assistants and Nurse Practitioners) who all work together to provide you with the care you need, when you need it.  We recommend signing up for the patient portal called "MyChart".  Sign up information is provided on this After Visit Summary.  MyChart is used to connect with patients for Virtual Visits (Telemedicine).  Patients are able to view lab/test results, encounter notes, upcoming appointments, etc.  Non-urgent messages can be sent to your provider as well.   To learn more about what you can do with MyChart, go to [email or web address].    Your next appointment:   1 year(s)  The format for your next appointment:   In Person  Provider:   Mark Skains, MD   Thank you for choosing [facility]!!    

## 2021-01-06 ENCOUNTER — Other Ambulatory Visit: Payer: Self-pay

## 2021-01-06 DIAGNOSIS — E785 Hyperlipidemia, unspecified: Secondary | ICD-10-CM

## 2021-01-06 DIAGNOSIS — I10 Essential (primary) hypertension: Secondary | ICD-10-CM

## 2021-01-06 DIAGNOSIS — Z951 Presence of aortocoronary bypass graft: Secondary | ICD-10-CM

## 2021-01-06 DIAGNOSIS — I5032 Chronic diastolic (congestive) heart failure: Secondary | ICD-10-CM

## 2021-01-06 MED ORDER — HYDRALAZINE HCL 50 MG PO TABS: ORAL_TABLET | ORAL | 3 refills | Status: DC

## 2021-04-06 ENCOUNTER — Encounter: Payer: Self-pay | Admitting: Family Medicine

## 2021-04-06 ENCOUNTER — Other Ambulatory Visit: Payer: Self-pay

## 2021-04-06 ENCOUNTER — Ambulatory Visit (INDEPENDENT_AMBULATORY_CARE_PROVIDER_SITE_OTHER): Payer: Medicare Other | Admitting: Family Medicine

## 2021-04-06 DIAGNOSIS — E1122 Type 2 diabetes mellitus with diabetic chronic kidney disease: Secondary | ICD-10-CM | POA: Diagnosis not present

## 2021-04-06 LAB — COMPREHENSIVE METABOLIC PANEL
ALT: 16 U/L (ref 0–53)
AST: 19 U/L (ref 0–37)
Albumin: 4.3 g/dL (ref 3.5–5.2)
Alkaline Phosphatase: 51 U/L (ref 39–117)
BUN: 30 mg/dL — ABNORMAL HIGH (ref 6–23)
CO2: 24 mEq/L (ref 19–32)
Calcium: 8.9 mg/dL (ref 8.4–10.5)
Chloride: 106 mEq/L (ref 96–112)
Creatinine, Ser: 1.52 mg/dL — ABNORMAL HIGH (ref 0.40–1.50)
GFR: 44.95 mL/min — ABNORMAL LOW (ref >60.00)
Glucose, Bld: 148 mg/dL — ABNORMAL HIGH (ref 70–99)
Potassium: 4.1 mEq/L (ref 3.5–5.1)
Sodium: 141 mEq/L (ref 135–145)
Total Bilirubin: 0.6 mg/dL (ref 0.2–1.2)
Total Protein: 6.6 g/dL (ref 6.0–8.3)

## 2021-04-06 LAB — HEMOGLOBIN A1C: Hgb A1c MFr Bld: 7.3 % — ABNORMAL HIGH (ref 4.6–6.5)

## 2021-04-06 MED ORDER — METFORMIN HCL 500 MG PO TABS: 500.0000 mg | ORAL_TABLET | Freq: Two times a day (BID) | ORAL | 1 refills | Status: DC

## 2021-04-06 NOTE — Patient Instructions (Signed)
I think pool-based exercise would be a great way to incorporate exercise.  I will check A1c today and we can decide if any med changes needed.  Continue metformin same dose for now.  Recheck in 3 months but let me know if there are questions sooner and take care.

## 2021-04-06 NOTE — Progress Notes (Signed)
Subjective:  Patient ID: Antonio Wells, male    DOB: [birth year]  Age: 75 y.o. MRN: [id number]  CC:  Chief Complaint  Patient presents with   Diabetes    Pt here for 3 month, recheck and refills today,  no concerns     HPI [name] presents for   Diabetes: With microalbuminuria, chronic kidney disease, coronary artery disease.  Nephrology Dr. [name], and followed by cardiology. Dr. [name].  He is on statin, ARB, metformin 500 mg twice daily as he has not tolerated higher doses of metformin.  Minimal change in A1c in June.  We have discussed SGLT2's but with history of phimosis and balanitis some concerns with that group of medications.  Exercise has been limited by knee issues last visit, discussed pool exercise.  Weight has increased slightly since last visit. Has not changed exercise - plans to start soon. Planning on swimming, but not ready after circumcision initially - 6 months ago.  Wt Readings from Last 3 Encounters:  04/06/21 182 lb 6.4 oz (82.7 kg)  01/01/21 177 lb (80.3 kg)  12/29/20 177 lb (80.3 kg)  Fasting:  2 hr postprandial:none.  No symptomatic lows.  Microalbumin: Ratio 43 on 02/22/2020, on ARB. Optho, foot exam, pneumovax: Up-to-date  Lab Results  Component Value Date   HGBA1C 7.3 (H) 12/29/2020   HGBA1C 7.1 (H) 05/28/2020   HGBA1C 6.3 (H) 02/22/2020   Lab Results  Component Value Date   MICROALBUR 39.6 08/25/2015   LDLCALC 69 12/29/2020   CREATININE 1.50 12/29/2020    History Patient Active Problem List   Diagnosis Date Noted   Syncope 02/15/2020   Bradycardia 02/15/2020   Hypertensive encephalopathy 10/19/2018   Chronic kidney disease, stage 3 10/19/2018   DM (diabetes mellitus), type 2 with renal complications ([place]) [id number]   Cough 03/17/2018   Lower resp. 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   CAD (coronary artery disease) 05/25/2017   Unstable angina ([place])    Hypertensive heart disease 07/14/2015   Lower extremity edema 07/14/2015   Aortic stenosis 01/09/2015   OSA on CPAP 12/12/2014   Hypersomnia with sleep apnea 12/12/2014   Obstructive sleep apnea (adult) (pediatric) 02/04/2014   Hypersomnia, persistent 02/04/2014   Obesity 02/04/2014   Mitral regurgitation and aortic stenosis 01/02/2014   DOE (dyspnea on exertion) 11/26/2013   Gout 08/12/2011   Hypertension 08/12/2011   High cholesterol 08/12/2011   OSA (obstructive sleep apnea) 08/12/2011   Diabetes type 2, uncontrolled ([place]) 08/12/2011   Past Medical History:  Diagnosis Date   Arthritis    "knees" (07/11/2017)   CKD (chronic kidney disease), stage III ([place])    lov dr Thurmond Butts [place] [id number] on chart   Complication of anesthesia    post op afibe after cabg x 2 2018   Coronary artery disease    a.  CABGx3 in 05/2017.   Gout    Heart murmur    mild, saw dr Lovena Le 03-26-2020 f/u prn   High cholesterol    Hypertension    Mild aortic stenosis    Mild mitral regurgitation    OSA on CPAP    last used cpap 3 weeks ago   Phimosis    Postoperative atrial fibrillation ([place]) 06/2017   Sleep apnea  uses CPAP   Type II diabetes mellitus [facility])    Past Surgical History:  Procedure Laterality Date   CARDIAC CATHETERIZATION     CIRCUMCISION N/A 06/27/2020   Procedure: CIRCUMCISION ADULT;  Surgeon: Ceasar Mons, MD;  Location: [facility];  Service: Urology;  Laterality: N/A;   COLONOSCOPY  2008   CORONARY ARTERY BYPASS GRAFT N/A 06/24/2017   Procedure: CORONARY ARTERY BYPASS GRAFTING (CABG) x 3 ; Using Left Internal Mammary Artery and Right Great Saphenous Leg Vein Harvested Endoscopically;  Surgeon: Gaye Pollack, MD;  Location: [facility];  Service: Open Heart Surgery;  Laterality: N/A;   LEFT HEART CATH AND CORONARY  ANGIOGRAPHY N/A 05/25/2017   Procedure: LEFT HEART CATH AND CORONARY ANGIOGRAPHY;  Surgeon: Wellington Hampshire, MD;  Location: [facility];  Service: Cardiovascular;  Laterality: N/A;   TEE WITHOUT CARDIOVERSION N/A 06/24/2017   Procedure: TRANSESOPHAGEAL ECHOCARDIOGRAM (TEE);  Surgeon: Gaye Pollack, MD;  Location: [facility];  Service: Open Heart Surgery;  Laterality: N/A;   Allergies  Allergen Reactions   Penicillins Other (See Comments)    Has patient had a PCN reaction causing immediate rash, facial/tongue/throat swelling, SOB or lightheadedness with hypotension: No Has patient had a PCN reaction causing severe rash involving mucus membranes or skin necrosis: No Has patient had a PCN reaction that required hospitalization: No Has patient had a PCN reaction occurring within the last 10 years: No If all of the above answers are "NO", then may proceed with Cephalosporin use.   Passed out ? SYNCOPE ?   Lisinopril Cough   Amiodarone Nausea And Vomiting   Amlodipine Other (See Comments)    Causes dizzines   Beta Adrenergic Blockers     Do not take beta blockers causes heart block with coreg   Oxycodone Other (See Comments)    hallucinations   Zofran [Ondansetron Hcl] Other (See Comments)    Has an interaction with another medication pt takes    Cortisone Nausea And Vomiting and Other (See Comments)    HICCUPS   Tramadol Nausea And Vomiting and Other (See Comments)    HICCUPS   Prior to Admission medications   Medication Sig Start Date End Date Taking? Authorizing Provider  acetaminophen (TYLENOL) 325 MG tablet Take 650 mg by mouth every 6 (six) hours as needed for mild pain.    [provider]  aspirin EC 81 MG tablet Take 1 tablet (81 mg total) by mouth daily. 09/02/17   Dunn, Nedra Hai, PA-C  atorvastatin (LIPITOR) 80 MG tablet TAKE 1 TABLET BY MOUTH  DAILY AT 6 PM 12/29/20   [name], MD  chlorthalidone (HYGROTON) 25 MG tablet Take 12.5 mg by mouth every morning.   09/10/19   [provider]  colchicine 0.6 MG tablet TAKE 2 TABS ON ONSET OF GOUT FLARE THEN REPEAT IN 1 HOUR OF ACUTE GOUT ATTACK 07/06/19   [name], MD  glucose blood test strip Use as instructed 01/21/20   [name], MD  guaiFENesin (MUCINEX) 600 MG 12 hr tablet Take 600 mg by mouth 2 (two) times daily as needed (congestion, cough).     [provider]  hydrALAZINE (APRESOLINE) 50 MG tablet TAKE 1 TABLET BY MOUTH IN  THE MORNING THEN TAKE 2  TABLETS BY MOUTH IN THE  EVENING DAILY. 01/06/21   Jerline Pain, MD  irbesartan (AVAPRO) 300 MG tablet Take 300 mg by mouth daily.    [provider]  isosorbide mononitrate (IMDUR) 60 MG 24 hr tablet TAKE 1 TABLET BY MOUTH AT  BEDTIME 11/21/20   Allred, Jeneen Rinks, MD  Lancets [facility] ULTRASOFT) lancets 1 each by Other route 1 day or 1 dose for 1 dose. Use as instructed 01/30/20 01/01/21  [name], MD  metFORMIN (GLUCOPHAGE) 500 MG tablet Take 1 tablet (500 mg total) by mouth 2 (two) times daily with a meal. 12/29/20   [name], MD   Social History   Socioeconomic History   Marital status: Married    Spouse name: [name]   Number of children: 3   Years of education: 16   Highest education level: Not on file  Occupational History   Occupation: Musician  Tobacco Use   Smoking status: Former    Packs/day: 2.00    Years: 15.00    Pack years: 30.00    Types: Cigarettes    Quit date: 07/26/1998    Years since quitting: 22.7   Smokeless tobacco: Never  Vaping Use   Vaping Use: Never used  Substance and Sexual Activity   Alcohol use: Not Currently    Alcohol/week: 0.0 standard drinks   Drug use: No   Sexual activity: Yes  Other Topics Concern   Not on file  Social History Narrative         Social Determinants of Health   Financial Resource Strain: Not on file  Food Insecurity: Not on file  Transportation Needs: Not on file  Physical Activity: Not on file  Stress: Not on file  Social  Connections: Not on file  Intimate Partner Violence: Not on file    Review of Systems  Constitutional:  Negative for fatigue and unexpected weight change.  Eyes:  Negative for visual disturbance.  Respiratory:  Negative for cough, chest tightness and shortness of breath.   Cardiovascular:  Negative for chest pain, palpitations and leg swelling.  Gastrointestinal:  Negative for abdominal pain and blood in stool.  Neurological:  Negative for dizziness, light-headedness and headaches.    Objective:   Vitals:   04/06/21 0956  BP: 124/70  Pulse: 95  Resp: 17  Temp: 98.3 F (36.8 C)  TempSrc: Temporal  SpO2: 96%  Weight: 182 lb 6.4 oz (82.7 kg)  Height: '5\' 3"'$  (1.6 m)     Physical Exam Vitals reviewed.  Constitutional:      Appearance: He is well-developed.  HENT:     Head: Normocephalic and atraumatic.  Neck:     Vascular: No carotid bruit or JVD.  Cardiovascular:     Rate and Rhythm: Normal rate and regular rhythm.     Heart sounds: Normal heart sounds. No murmur heard. Pulmonary:     Effort: Pulmonary effort is normal.     Breath sounds: Normal breath sounds. No rales.  Musculoskeletal:     Right lower leg: No edema.     Left lower leg: No edema.  Skin:    General: Skin is warm and dry.  Neurological:     Mental Status: He is alert and oriented to person, place, and time.  Psychiatric:        Mood and Affect: Mood normal.       Assessment & Plan:  LUISEDUARDO COLATO is a 75 y.o. male . Type 2 diabetes mellitus with chronic kidney disease, without long-term current use of insulin, unspecified CKD stage ([place]) - Plan: Hemoglobin A1c, Comprehensive metabolic panel, metFORMIN (GLUCOPHAGE) 500 MG tablet  -During current regimen.  Plan for pool-based exercise.  Feels comfortable doing so now that he has had some time since circumcision.  Discussed various swimsuit options that may be more comfortable for swimming, but can also discuss with urology if needed.  Continue  same meds for now, check A1c.  He is hesitant to start SGLT2 due to concern of mycotic infections.  39-monthfollow-up  Meds ordered this encounter  Medications   metFORMIN (GLUCOPHAGE) 500 MG tablet    Sig: Take 1 tablet (500 mg total) by mouth 2 (two) times daily with a meal.    Dispense:  180 tablet    Refill:  1   Patient Instructions  I think pool-based exercise would be a great way to incorporate exercise.  I will check A1c today and we can decide if any med changes needed.  Continue metformin same dose for now.  Recheck in 3 months but let me know if there are questions sooner and take care.     Signed,   JMerri Ray MD [facility] 04/06/21 10:42 AM

## 2021-04-30 ENCOUNTER — Telehealth: Payer: Self-pay | Admitting: Family Medicine

## 2021-04-30 NOTE — Chronic Care Management (AMB) (Signed)
  Chronic Care Management   Outreach Note  04/30/2021 Name: Antonio Wells MRN: [id number] DOB: 04-29-1946  Referred by: [name], MD Reason for referral : No chief complaint on file.   An unsuccessful telephone outreach was attempted today. The patient was referred to the pharmacist for assistance with care management and care coordination.   Follow Up Plan:   Tatjana Dellinger Upstream Scheduler

## 2021-07-09 ENCOUNTER — Encounter: Payer: Self-pay | Admitting: Family Medicine

## 2021-07-09 ENCOUNTER — Ambulatory Visit (INDEPENDENT_AMBULATORY_CARE_PROVIDER_SITE_OTHER): Payer: Medicare Other | Admitting: Family Medicine

## 2021-07-09 VITALS — BP 128/76 | HR 82 | Temp 98.0°F | Resp 17 | Ht 63.0 in | Wt 187.6 lb

## 2021-07-09 DIAGNOSIS — I10 Essential (primary) hypertension: Secondary | ICD-10-CM | POA: Diagnosis not present

## 2021-07-09 DIAGNOSIS — E1122 Type 2 diabetes mellitus with diabetic chronic kidney disease: Secondary | ICD-10-CM | POA: Diagnosis not present

## 2021-07-09 DIAGNOSIS — E785 Hyperlipidemia, unspecified: Secondary | ICD-10-CM | POA: Diagnosis not present

## 2021-07-09 DIAGNOSIS — I2510 Atherosclerotic heart disease of native coronary artery without angina pectoris: Secondary | ICD-10-CM | POA: Diagnosis not present

## 2021-07-09 LAB — LIPID PANEL
Cholesterol: 129 mg/dL (ref 0–200)
HDL: 59.2 mg/dL (ref >39.00)
LDL Cholesterol: 55 mg/dL (ref 0–99)
NonHDL: 70.29
Total CHOL/HDL Ratio: 2
Triglycerides: 77 mg/dL (ref 0.0–149.0)
VLDL: 15.4 mg/dL (ref 0.0–40.0)

## 2021-07-09 LAB — COMPREHENSIVE METABOLIC PANEL
ALT: 15 U/L (ref 0–53)
AST: 18 U/L (ref 0–37)
Albumin: 4.6 g/dL (ref 3.5–5.2)
Alkaline Phosphatase: 50 U/L (ref 39–117)
BUN: 26 mg/dL — ABNORMAL HIGH (ref 6–23)
CO2: 28 mEq/L (ref 19–32)
Calcium: 9.2 mg/dL (ref 8.4–10.5)
Chloride: 106 mEq/L (ref 96–112)
Creatinine, Ser: 1.71 mg/dL — ABNORMAL HIGH (ref 0.40–1.50)
GFR: 38.95 mL/min — ABNORMAL LOW (ref >60.00)
Glucose, Bld: 118 mg/dL — ABNORMAL HIGH (ref 70–99)
Potassium: 4.2 mEq/L (ref 3.5–5.1)
Sodium: 143 mEq/L (ref 135–145)
Total Bilirubin: 0.7 mg/dL (ref 0.2–1.2)
Total Protein: 7.1 g/dL (ref 6.0–8.3)

## 2021-07-09 LAB — HEMOGLOBIN A1C: Hgb A1c MFr Bld: 7.4 % — ABNORMAL HIGH (ref 4.6–6.5)

## 2021-07-09 MED ORDER — ATORVASTATIN CALCIUM 80 MG PO TABS: ORAL_TABLET | ORAL | 3 refills | Status: DC

## 2021-07-09 MED ORDER — METFORMIN HCL 500 MG PO TABS: 500.0000 mg | ORAL_TABLET | Freq: Two times a day (BID) | ORAL | 1 refills | Status: DC

## 2021-07-09 NOTE — Patient Instructions (Signed)
Thanks for coming in today.  I will check A1c but if that is still elevated or increasing we will need to add additional medication.  Would consider a medication like Januvia if that is not cost prohibitive, or we could return to Glucotrol again.  We will let you know once I review your labs.  Keep up the good work with exercise.  Recheck in 3 months but let me know if there are questions sooner.

## 2021-07-09 NOTE — Progress Notes (Addendum)
Subjective:  Patient ID: Antonio Wells, male    DOB: 1945-08-11  Age: 75 y.o. MRN: [id number]  CC:  Chief Complaint  Patient presents with   Diabetes    Pt here for 3 month follow up no concerns feels well     HPI CONOR LATA presents for   Diabetes: With microalbuminuria, CKD, CAD.  Nephrology Dr. [name], cardiology Dr. [name] Treated with statin, ARB, metformin 500 mg twice daily (intolerant to higher doses).  SGLT2 discussed with history of CAD but with history of phimosis, balanitis, concerns with that group of meds.  Planned on increased exercise after his last visit -delayed after circumcision.  Weight has increased 5 pounds since September visit. 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  Some increase exercise, but room for improvement. Has not returned to swimming yet. Still having some issues with complete healing.  Home readings:113, 116, 125, 140, 115.  No postprandial readings.  No symptomatic lows.  Tolerated glucotrol in past.  Wt Readings from Last 3 Encounters:  07/09/21 187 lb 9.6 oz (85.1 kg)  04/06/21 182 lb 6.4 oz (82.7 kg)  01/01/21 177 lb (80.3 kg)  Fasting today.   Microalbumin: He is on ARB.  Ratio 43 in July 2021 Optho, foot exam, pneumovax: Up-to-date, except last pneumonia vaccine was in 2013. Declines repeat pneumonia vaccine.  Immunization History  Administered Date(s) Administered   Influenza, High Dose Seasonal PF 03/20/2019   Influenza, Seasonal, Injecte, Preservative Fre 07/03/2012   Influenza,inj,Quad PF,6+ Mos 08/25/2015, 05/12/2017, 05/29/2018   Influenza-Unspecified 07/24/2016, 04/23/2020   PFIZER(Purple Top)SARS-COV-2 Vaccination 08/31/2019, 09/21/2019, 04/02/2020, 12/04/2020   Pneumococcal Polysaccharide-23 07/03/2012   Tdap 07/03/2012  Flu vaccine: in September at other office.  COVID booster in May, then bivalent booster.   Lab Results  Component Value Date   HGBA1C 7.3 (H) 04/06/2021   HGBA1C 7.3 (H) 12/29/2020    HGBA1C 7.1 (H) 05/28/2020   Lab Results  Component Value Date   MICROALBUR 39.6 08/25/2015   LDLCALC 69 12/29/2020   CREATININE 1.52 (H) 04/06/2021   Hypertension: With CKD.  Irbesartan, chlorthalidone, hydralazine. No new side effects.  Home readings:120-140/60-70's.  BP Readings from Last 3 Encounters:  07/09/21 128/76  04/06/21 124/70  01/01/21 112/80   Lab Results  Component Value Date   CREATININE 1.52 (H) 04/06/2021   Hyperlipidemia: Lipitor 80mg  qd. No new myalgias.  Lab Results  Component Value Date   CHOL 154 12/29/2020   HDL 61.70 12/29/2020   LDLCALC 69 12/29/2020   TRIG 114.0 12/29/2020   CHOLHDL 2 12/29/2020   Lab Results  Component Value Date   ALT 16 04/06/2021   AST 19 04/06/2021   ALKPHOS 51 04/06/2021   BILITOT 0.6 04/06/2021        History Patient Active Problem List   Diagnosis Date Noted   Syncope 02/15/2020   Bradycardia 02/15/2020   Hypertensive encephalopathy 10/19/2018   Chronic kidney disease, stage 3 10/19/2018   DM (diabetes mellitus), type 2 with renal complications ([place]) 95/03/3266   Cough 03/17/2018   Lower resp. 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   CAD (coronary artery disease) 05/25/2017   Unstable angina (HCC)    Hypertensive heart disease 07/14/2015   Lower extremity edema 07/14/2015   Aortic stenosis 01/09/2015  OSA on CPAP 12/12/2014   Hypersomnia with sleep apnea 12/12/2014   Obstructive sleep apnea (adult) (pediatric) 02/04/2014   Hypersomnia, persistent 02/04/2014   Obesity 02/04/2014   Mitral regurgitation and aortic stenosis 01/02/2014   DOE (dyspnea on exertion) 11/26/2013   Gout 08/12/2011   Hypertension 08/12/2011   High cholesterol 08/12/2011   OSA (obstructive sleep apnea) 08/12/2011    Diabetes type 2, uncontrolled 08/12/2011   Past Medical History:  Diagnosis Date   Arthritis    "knees" (07/11/2017)   CKD (chronic kidney disease), stage III ([place])    lov dr Thurmond Butts [place] 11-01-1446 on chart   Complication of anesthesia    post op afibe after cabg x 2 2018   Coronary artery disease    a.  CABGx3 in 05/2017.   Gout    Heart murmur    mild, saw dr Lovena Le 03-26-2020 f/u prn   High cholesterol    Hypertension    Mild aortic stenosis    Mild mitral regurgitation    OSA on CPAP    last used cpap 3 weeks ago   Phimosis    Postoperative atrial fibrillation ([place]) 06/2017   Sleep apnea    uses CPAP   Type II diabetes mellitus [facility])    Past Surgical History:  Procedure Laterality Date   CARDIAC CATHETERIZATION     CIRCUMCISION N/A 06/27/2020   Procedure: CIRCUMCISION ADULT;  Surgeon: Ceasar Mons, MD;  Location: [facility];  Service: Urology;  Laterality: N/A;   COLONOSCOPY  2008   CORONARY ARTERY BYPASS GRAFT N/A 06/24/2017   Procedure: CORONARY ARTERY BYPASS GRAFTING (CABG) x 3 ; Using Left Internal Mammary Artery and Right Great Saphenous Leg Vein Harvested Endoscopically;  Surgeon: Gaye Pollack, MD;  Location: [place] OR;  Service: Open Heart Surgery;  Laterality: N/A;   LEFT HEART CATH AND CORONARY ANGIOGRAPHY N/A 05/25/2017   Procedure: LEFT HEART CATH AND CORONARY ANGIOGRAPHY;  Surgeon: Wellington Hampshire, MD;  Location: [place] [facility];  Service: Cardiovascular;  Laterality: N/A;   TEE WITHOUT CARDIOVERSION N/A 06/24/2017   Procedure: TRANSESOPHAGEAL ECHOCARDIOGRAM (TEE);  Surgeon: Gaye Pollack, MD;  Location: [place];  Service: Open Heart Surgery;  Laterality: N/A;   Allergies  Allergen Reactions   Penicillins Other (See Comments)    Has patient had a PCN reaction causing immediate rash, facial/tongue/throat swelling, SOB or lightheadedness with hypotension: No Has patient had a PCN reaction causing severe rash involving mucus  membranes or skin necrosis: No Has patient had a PCN reaction that required hospitalization: No Has patient had a PCN reaction occurring within the last 10 years: No If all of the above answers are "NO", then may proceed with Cephalosporin use.   Passed out ? SYNCOPE ?   Lisinopril Cough   Amiodarone Nausea And Vomiting   Amlodipine Other (See Comments)    Causes dizzines   Beta Adrenergic Blockers     Do not take beta blockers causes heart block with coreg   Oxycodone Other (See Comments)    hallucinations   Zofran [Ondansetron Hcl] Other (See Comments)    Has an interaction with another medication pt takes    Cortisone Nausea And Vomiting and Other (See Comments)    HICCUPS   Tramadol Nausea And Vomiting and Other (See Comments)    HICCUPS   Prior to Admission medications   Medication Sig Start Date End Date Taking? Authorizing Provider  acetaminophen (TYLENOL) 325 MG tablet Take 650 mg by  mouth every 6 (six) hours as needed for mild pain.   Yes [provider]  aspirin EC 81 MG tablet Take 1 tablet (81 mg total) by mouth daily. 09/02/17  Yes Dunn, Dayna N, PA-C  atorvastatin (LIPITOR) 80 MG tablet TAKE 1 TABLET BY MOUTH  DAILY AT 6 PM 12/29/20  Yes [name], MD  chlorthalidone (HYGROTON) 25 MG tablet Take 12.5 mg by mouth every morning.  09/10/19  Yes [provider]  colchicine 0.6 MG tablet TAKE 2 TABS ON ONSET OF GOUT FLARE THEN REPEAT IN 1 HOUR OF ACUTE GOUT ATTACK 07/06/19  Yes [name], MD  glucose blood test strip Use as instructed 01/21/20  Yes [name], MD  guaiFENesin (MUCINEX) 600 MG 12 hr tablet Take 600 mg by mouth 2 (two) times daily as needed (congestion, cough).    Yes [provider]  hydrALAZINE (APRESOLINE) 50 MG tablet TAKE 1 TABLET BY MOUTH IN  THE MORNING THEN TAKE 2  TABLETS BY MOUTH IN THE  EVENING DAILY. 01/06/21  Yes Jerline Pain, MD  irbesartan (AVAPRO) 300 MG tablet Take 300 mg by mouth daily.   Yes  [provider]  isosorbide mononitrate (IMDUR) 60 MG 24 hr tablet TAKE 1 TABLET BY MOUTH AT  BEDTIME 11/21/20  Yes Allred, Jeneen Rinks, MD  metFORMIN (GLUCOPHAGE) 500 MG tablet Take 1 tablet (500 mg total) by mouth 2 (two) times daily with a meal. 04/06/21  Yes [name], MD  Lancets [facility] ULTRASOFT) lancets 1 each by Other route 1 day or 1 dose for 1 dose. Use as instructed 01/30/20 01/01/21  [name], MD   Social History   Socioeconomic History   Marital status: Married    Spouse name: [name]   Number of children: 3   Years of education: 16   Highest education level: Not on file  Occupational History   Occupation: Musician  Tobacco Use   Smoking status: Former    Packs/day: 2.00    Years: 15.00    Pack years: 30.00    Types: Cigarettes    Quit date: 07/26/1998    Years since quitting: 22.9   Smokeless tobacco: Never  Vaping Use   Vaping Use: Never used  Substance and Sexual Activity   Alcohol use: Not Currently    Alcohol/week: 0.0 standard drinks   Drug use: No   Sexual activity: Yes  Other Topics Concern   Not on file  Social History Narrative         Social Determinants of Health   Financial Resource Strain: Not on file  Food Insecurity: Not on file  Transportation Needs: Not on file  Physical Activity: Not on file  Stress: Not on file  Social Connections: Not on file  Intimate Partner Violence: Not on file    Review of Systems  Constitutional:  Negative for fatigue and unexpected weight change.  Eyes:  Positive for visual disturbance (due for new glasses.).  Respiratory:  Negative for cough, chest tightness and shortness of breath.   Cardiovascular:  Negative for chest pain, palpitations and leg swelling.  Gastrointestinal:  Negative for abdominal pain and blood in stool.  Neurological:  Negative for dizziness, light-headedness and headaches.    Objective:   Vitals:   07/09/21 1022  BP: 128/76  Pulse: 82  Resp: 17  Temp: 98 F  (36.7 C)  TempSrc: Temporal  SpO2: 97%  Weight: 187 lb 9.6 oz (85.1 kg)  Height: 5\' 3"  (  1.6 m)     Physical Exam Vitals reviewed.  Constitutional:      Appearance: He is well-developed.  HENT:     Head: Normocephalic and atraumatic.  Neck:     Vascular: No carotid bruit or JVD.  Cardiovascular:     Rate and Rhythm: Normal rate and regular rhythm.     Heart sounds: Murmur (2/6 systolic) heard.  Pulmonary:     Effort: Pulmonary effort is normal.     Breath sounds: Normal breath sounds. No rales.  Musculoskeletal:     Right lower leg: No edema.     Left lower leg: No edema.  Skin:    General: Skin is warm and dry.  Neurological:     Mental Status: He is alert and oriented to person, place, and time.  Psychiatric:        Mood and Affect: Mood normal.      31 minutes spent during visit, including chart review, counseling and assimilation of information, exam, discussion of plan, med options for diabetes, and chart completion.   Assessment & Plan:  CAMDEN KNOTEK is a 75 y.o. male . Type 2 diabetes mellitus with chronic kidney disease, without long-term current use of insulin, unspecified CKD stage ([place]) - Plan: Hemoglobin A1c, metFORMIN (GLUCOPHAGE) 500 MG tablet  -Decreased control on the last few labs.  With his history of CAD, CKD would like to see A1c below 7.  Weight has increased slightly.  Commended on some return to exercise, plans to increase further.  Check A1c.  Consider DPP 4 versus Glucotrol restart.  Various options discussed.  Hesitant to start SGLT2 given balanitis/candidal risk.  Consider GLP-1 depending on A1c.  Continue same dose metformin for now. Declined updated Pneumovax.  Coronary artery disease involving native heart without angina pectoris, unspecified vessel or lesion type - Plan: atorvastatin (LIPITOR) 80 MG tablet  -Asymptomatic, continue statin, check lipid panel. Hyperlipidemia, unspecified hyperlipidemia type - Plan: Lipid panel  Essential  hypertension - Plan: Comprehensive metabolic panel Stable on current regimen, no changes.  Meds ordered this encounter  Medications   metFORMIN (GLUCOPHAGE) 500 MG tablet    Sig: Take 1 tablet (500 mg total) by mouth 2 (two) times daily with a meal.    Dispense:  180 tablet    Refill:  1   atorvastatin (LIPITOR) 80 MG tablet    Sig: TAKE 1 TABLET BY MOUTH  DAILY AT 6 PM    Dispense:  90 tablet    Refill:  3    Requesting 1 year supply   Patient Instructions  Thanks for coming in today.  I will check A1c but if that is still elevated or increasing we will need to add additional medication.  Would consider a medication like Januvia if that is not cost prohibitive, or we could return to Glucotrol again.  We will let you know once I review your labs.  Keep up the good work with exercise.  Recheck in 3 months but let me know if there are questions sooner.    Signed,   [name], MD [place] 07/09/21 11:22 AM

## 2021-07-27 ENCOUNTER — Other Ambulatory Visit: Payer: Self-pay

## 2021-07-27 ENCOUNTER — Ambulatory Visit (INDEPENDENT_AMBULATORY_CARE_PROVIDER_SITE_OTHER): Payer: Medicare Other

## 2021-07-27 ENCOUNTER — Encounter (HOSPITAL_COMMUNITY): Payer: Self-pay

## 2021-07-27 ENCOUNTER — Ambulatory Visit (HOSPITAL_COMMUNITY)
Admission: EM | Admit: 2021-07-27 | Discharge: 2021-07-27 | Disposition: A | Discharge status: Home / Self Care | Payer: Medicare Other | Attending: Family Medicine | Admitting: Family Medicine

## 2021-07-27 DIAGNOSIS — R059 Cough, unspecified: Secondary | ICD-10-CM | POA: Diagnosis not present

## 2021-07-27 DIAGNOSIS — R062 Wheezing: Secondary | ICD-10-CM

## 2021-07-27 DIAGNOSIS — J069 Acute upper respiratory infection, unspecified: Secondary | ICD-10-CM | POA: Diagnosis not present

## 2021-07-27 LAB — POC INFLUENZA A AND B ANTIGEN (URGENT CARE ONLY)
INFLUENZA A ANTIGEN, POC: NEGATIVE
INFLUENZA B ANTIGEN, POC: NEGATIVE

## 2021-07-27 MED ORDER — PREDNISONE 20 MG PO TABS: 40.0000 mg | ORAL_TABLET | Freq: Every day | ORAL | 0 refills | Status: AC

## 2021-07-27 MED ORDER — CHERATUSSIN AC 100-10 MG/5ML PO SOLN: 5.0000 mL | Freq: Four times a day (QID) | ORAL | 0 refills | Status: DC | PRN

## 2021-07-27 MED ORDER — ALBUTEROL SULFATE HFA 108 (90 BASE) MCG/ACT IN AERS: 1.0000 | INHALATION_SPRAY | RESPIRATORY_TRACT | 0 refills | Status: DC | PRN

## 2021-07-27 NOTE — Discharge Instructions (Addendum)
Your flu test was negative.

## 2021-07-27 NOTE — ED Triage Notes (Addendum)
Pt presents with complaints of nasal drainage and cough x 2 days. Denies relief with otc medications. Negative at home covid test. Pt not interested in pcr test.

## 2021-07-27 NOTE — ED Provider Notes (Signed)
[place]    CSN: [id number] Arrival date & time: 07/27/21  1150      History   Chief Complaint Chief Complaint  Patient presents with   Cough    HPI Antonio Wells is a 76 y.o. male.    Cough Here with a 2 day h/o cough, and has felt a little tight in chest, and has heard some wheezing. No h/o asthma or COPD. Has begun hurting in his bilateral lower rib cage, and then in his abdominal muscles. No f/c. Does feel really tired. No n/v/d, but not much appetite  Past Medical History:  Diagnosis Date   Arthritis    "knees" (07/11/2017)   CKD (chronic kidney disease), stage III ([place])    lov dr Thurmond Butts [place] 11-28-3974 on chart   Complication of anesthesia    post op afibe after cabg x 2 2018   Coronary artery disease    a.  CABGx3 in 05/2017.   Gout    Heart murmur    mild, saw dr Lovena Le 03-26-2020 f/u prn   High cholesterol    Hypertension    Mild aortic stenosis    Mild mitral regurgitation    OSA on CPAP    last used cpap 3 weeks ago   Phimosis    Postoperative atrial fibrillation ([place]) 06/2017   Sleep apnea    uses CPAP   Type II diabetes mellitus ([place])     Patient Active Problem List   Diagnosis Date Noted   Syncope 02/15/2020   Bradycardia 02/15/2020   Hypertensive encephalopathy 10/19/2018   Chronic kidney disease, stage 3 10/19/2018   DM (diabetes mellitus), type 2 with renal complications ([place]) 73/41/9379   Cough 03/17/2018   Lower resp. 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   CAD (coronary artery disease) 05/25/2017   Unstable angina ([place])    Hypertensive heart disease 07/14/2015   Lower extremity edema 07/14/2015   Aortic stenosis 01/09/2015   OSA on CPAP 12/12/2014   Hypersomnia with sleep apnea 12/12/2014   Obstructive sleep  apnea (adult) (pediatric) 02/04/2014   Hypersomnia, persistent 02/04/2014   Obesity 02/04/2014   Mitral regurgitation and aortic stenosis 01/02/2014   DOE (dyspnea on exertion) 11/26/2013   Gout 08/12/2011   Hypertension 08/12/2011   High cholesterol 08/12/2011   OSA (obstructive sleep apnea) 08/12/2011   Diabetes type 2, uncontrolled 08/12/2011    Past Surgical History:  Procedure Laterality Date   CARDIAC CATHETERIZATION     CIRCUMCISION N/A 06/27/2020   Procedure: CIRCUMCISION ADULT;  Surgeon: Ceasar Mons, MD;  Location: [facility];  Service: Urology;  Laterality: N/A;   COLONOSCOPY  2008   CORONARY ARTERY BYPASS GRAFT N/A 06/24/2017   Procedure: CORONARY ARTERY BYPASS GRAFTING (CABG) x 3 ; Using Left Internal Mammary Artery and Right Great Saphenous Leg Vein Harvested Endoscopically;  Surgeon: Gaye Pollack, MD;  Location: [place] OR;  Service: Open Heart Surgery;  Laterality: N/A;   LEFT HEART CATH AND CORONARY ANGIOGRAPHY N/A 05/25/2017   Procedure: LEFT HEART CATH AND CORONARY ANGIOGRAPHY;  Surgeon: Wellington Hampshire, MD;  Location: [place] [facility];  Service: Cardiovascular;  Laterality: N/A;   TEE WITHOUT CARDIOVERSION N/A 06/24/2017   Procedure: TRANSESOPHAGEAL ECHOCARDIOGRAM (TEE);  Surgeon: Gaye Pollack, MD;  Location: [facility];  Service: Open Heart Surgery;  Laterality: N/A;       Home Medications    Prior to Admission medications   Medication Sig Start Date End Date Taking? Authorizing Provider  albuterol (VENTOLIN HFA) 108 (90 Base) MCG/ACT inhaler Inhale 1-2 puffs into the lungs every 4 (four) hours as needed for wheezing or shortness of breath. 07/27/21  Yes Tae Vonada, Gwenlyn Perking, MD  guaiFENesin-codeine (CHERATUSSIN AC) 100-10 MG/5ML syrup Take 5 mLs by mouth 4 (four) times daily as needed for cough. 07/27/21  Yes Kengo Sturges, Gwenlyn Perking, MD  predniSONE (DELTASONE) 20 MG tablet Take 2 tablets (40 mg total) by mouth daily with breakfast for 3 days.  07/27/21 07/30/21 Yes Barrett Henle, MD  acetaminophen (TYLENOL) 325 MG tablet Take 650 mg by mouth every 6 (six) hours as needed for mild pain.    [provider]  aspirin EC 81 MG tablet Take 1 tablet (81 mg total) by mouth daily. 09/02/17   Dunn, Nedra Hai, PA-C  atorvastatin (LIPITOR) 80 MG tablet TAKE 1 TABLET BY MOUTH  DAILY AT 6 PM 07/09/21   [name], MD  chlorthalidone (HYGROTON) 25 MG tablet Take 12.5 mg by mouth every morning.  09/10/19   [provider]  colchicine 0.6 MG tablet TAKE 2 TABS ON ONSET OF GOUT FLARE THEN REPEAT IN 1 HOUR OF ACUTE GOUT ATTACK 07/06/19   [name], MD  glucose blood test strip Use as instructed 01/21/20   [name], MD  guaiFENesin (MUCINEX) 600 MG 12 hr tablet Take 600 mg by mouth 2 (two) times daily as needed (congestion, cough).     [provider]  hydrALAZINE (APRESOLINE) 50 MG tablet TAKE 1 TABLET BY MOUTH IN  THE MORNING THEN TAKE 2  TABLETS BY MOUTH IN THE  EVENING DAILY. 01/06/21   Jerline Pain, MD  irbesartan (AVAPRO) 300 MG tablet Take 300 mg by mouth daily.    [provider]  isosorbide mononitrate (IMDUR) 60 MG 24 hr tablet TAKE 1 TABLET BY MOUTH AT  BEDTIME 11/21/20   Allred, Jeneen Rinks, MD  Lancets [facility] ULTRASOFT) lancets 1 each by Other route 1 day or 1 dose for 1 dose. Use as instructed 01/30/20 01/01/21  [name], MD  metFORMIN (GLUCOPHAGE) 500 MG tablet Take 1 tablet (500 mg total) by mouth 2 (two) times daily with a meal. 07/09/21   [name], MD    Family History Family History  Problem Relation Age of Onset   Hypertension Mother    Kidney disease Mother    Diabetes Mother    Hypertension Father    Cancer Brother        lung   Colon cancer Brother 11   Stroke Brother    Esophageal cancer Neg Hx    Rectal cancer Neg Hx    Stomach cancer Neg Hx    Colon polyps Neg Hx     Social History Social History   Tobacco Use   Smoking status: Former    Packs/day:  2.00    Years: 15.00    Pack years: 30.00    Types: Cigarettes    Quit date: 07/26/1998    Years since quitting: 23.0   Smokeless tobacco: Never  Vaping Use   Vaping Use: Never used  Substance Use Topics   Alcohol use: Not Currently    Alcohol/week: 0.0 standard drinks   Drug use: No     Allergies   Penicillins,  Lisinopril, Amiodarone, Amlodipine, Beta adrenergic blockers, Oxycodone, Zofran [ondansetron hcl], Cortisone, and Tramadol   Review of Systems Review of Systems  Respiratory:  Positive for cough.     Physical Exam Triage Vital Signs ED Triage Vitals  Enc Vitals Group     BP 07/27/21 1444 (!) 153/63     Pulse Rate 07/27/21 1444 (!) 109     Resp 07/27/21 1444 19     Temp 07/27/21 1444 98.5 F (36.9 C)     Temp src --      SpO2 07/27/21 1444 99 %     Weight --      Height --      Head Circumference --      Peak Flow --      Pain Score 07/27/21 1443 0     Pain Loc --      Pain Edu? --      Excl. in [place]? --    No data found.  Updated Vital Signs BP (!) 153/63    Pulse (!) 109    Temp 98.5 F (36.9 C)    Resp 19    SpO2 99%   Visual Acuity Right Eye Distance:   Left Eye Distance:   Bilateral Distance:    Right Eye Near:   Left Eye Near:    Bilateral Near:     Physical Exam Vitals reviewed.  Constitutional:      General: He is not in acute distress.    Appearance: He is not toxic-appearing or diaphoretic.  HENT:     Right Ear: Tympanic membrane normal.     Left Ear: Tympanic membrane normal.     Nose: Nose normal.     Mouth/Throat:     Mouth: Mucous membranes are moist.     Pharynx: No oropharyngeal exudate or posterior oropharyngeal erythema.  Eyes:     Extraocular Movements: Extraocular movements intact.     Pupils: Pupils are equal, round, and reactive to light.  Cardiovascular:     Rate and Rhythm: Normal rate and regular rhythm.     Heart sounds: No murmur heard. Pulmonary:     Effort: Pulmonary effort is normal.     Breath sounds:  Normal breath sounds.  Musculoskeletal:     Cervical back: Neck supple.  Lymphadenopathy:     Cervical: No cervical adenopathy.  Skin:    Coloration: Skin is not jaundiced or pale.  Neurological:     General: No focal deficit present.     Mental Status: He is alert and oriented to person, place, and time.  Psychiatric:        Behavior: Behavior normal.     UC Treatments / Results  Labs (all labs ordered are listed, but only abnormal results are displayed) Labs Reviewed  POC INFLUENZA A AND B ANTIGEN (URGENT CARE ONLY)    EKG   Radiology DG Chest 2 View  Result Date: 07/27/2021 CLINICAL DATA:  Cough, wheezing. EXAM: CHEST - 2 VIEW COMPARISON:  February 15, 2020. FINDINGS: The heart size and mediastinal contours are within normal limits. Both lungs are clear. Status post coronary bypass graft. The visualized skeletal structures are unremarkable. IMPRESSION: No active cardiopulmonary disease. Electronically Signed   By: Marijo Conception M.D.   On: 07/27/2021 15:46    Procedures Procedures (including critical care time)  Medications Ordered in UC Medications - No data to display  Initial Impression / Assessment and Plan / UC Course  I have reviewed the triage vital signs  and the nursing notes.  Pertinent labs & imaging results that were available during my care of the patient were reviewed by me and considered in my medical decision making (see chart for details).     Flu test negative; CXR clear. Will treat for asthma/COPD(is a former smoker) exacerbation, and will send cough syrup. He declines COVID testing; we did discuss why it still might be a good idea. Final Clinical Impressions(s) / UC Diagnoses   Final diagnoses:  Viral URI with cough  Wheezing     Discharge Instructions      Your flu test was negative.      ED Prescriptions     Medication Sig Dispense Auth. Provider   guaiFENesin-codeine (CHERATUSSIN AC) 100-10 MG/5ML syrup Take 5 mLs by mouth 4 (four)  times daily as needed for cough. 120 mL Barrett Henle, MD   albuterol (VENTOLIN HFA) 108 (90 Base) MCG/ACT inhaler Inhale 1-2 puffs into the lungs every 4 (four) hours as needed for wheezing or shortness of breath. 1 each Barrett Henle, MD   predniSONE (DELTASONE) 20 MG tablet Take 2 tablets (40 mg total) by mouth daily with breakfast for 3 days. 6 tablet Roman Dubuc, Gwenlyn Perking, MD      I have reviewed the PDMP during this encounter.   Barrett Henle, MD 07/27/21 [phone]

## 2021-07-29 ENCOUNTER — Telehealth: Payer: Self-pay

## 2021-07-29 ENCOUNTER — Encounter: Payer: Self-pay | Admitting: Family Medicine

## 2021-07-29 ENCOUNTER — Telehealth (INDEPENDENT_AMBULATORY_CARE_PROVIDER_SITE_OTHER): Payer: Medicare Other | Admitting: Family Medicine

## 2021-07-29 ENCOUNTER — Telehealth: Payer: Self-pay | Admitting: Family Medicine

## 2021-07-29 DIAGNOSIS — R051 Acute cough: Secondary | ICD-10-CM

## 2021-07-29 DIAGNOSIS — B349 Viral infection, unspecified: Secondary | ICD-10-CM | POA: Diagnosis not present

## 2021-07-29 MED ORDER — PROMETHAZINE-CODEINE 6.25-10 MG/5ML PO SOLN: 5.0000 mL | Freq: Every evening | ORAL | 0 refills | Status: DC | PRN

## 2021-07-29 MED ORDER — BENZONATATE 100 MG PO CAPS: 100.0000 mg | ORAL_CAPSULE | Freq: Three times a day (TID) | ORAL | 0 refills | Status: DC | PRN

## 2021-07-29 NOTE — Patient Instructions (Addendum)
If wheezing returns, be seen in person. If no change in symptoms can discontinue the albuterol. Check home covid test this morning and call me with the results. Mucinex Dm or tessalon perles during the day,  phenergan with codeine at bedtime if needed. Drink plenty of fluids and continue to rest. If not improving in next few days, I do recommend in person evaluation.  Hope you feel better soon!  Return to the clinic or go to the nearest emergency room if any of your symptoms worsen or new symptoms occur.  Cough, Adult Coughing is a reflex that clears your throat and your airways (respiratory system). Coughing helps to heal and protect your lungs. It is normal to cough occasionally, but a cough that happens with other symptoms or lasts a long time may be a sign of a condition that needs treatment. An acute cough may only last 2-3 weeks, while a chronic cough may last 8 or more weeks. Coughing is commonly caused by: Infection of the respiratory systemby viruses or bacteria. Breathing in substances that irritate your lungs. Allergies. Asthma. Mucus that runs down the back of your throat (postnasal drip). Smoking. Acid backing up from the stomach into the esophagus (gastroesophageal reflux). Certain medicines. Chronic lung problems. Other medical conditions such as heart failure or a blood clot in the lung (pulmonary embolism). Follow these instructions at home: Medicines Take over-the-counter and prescription medicines only as told by your health care provider. Talk with your health care provider before you take a cough suppressant medicine. Lifestyle  Avoid cigarette smoke. Do not use any products that contain nicotine or tobacco, such as cigarettes, e-cigarettes, and chewing tobacco. If you need help quitting, ask your health care provider. Drink enough fluid to keep your urine pale yellow. Avoid caffeine. Do not drink alcohol if your health care provider tells you not to drink. General  instructions  Pay close attention to changes in your cough. Tell your health care provider about them. Always cover your mouth when you cough. Avoid things that make you cough, such as perfume, candles, cleaning products, or campfire or tobacco smoke. If the air is dry, use a cool mist vaporizer or humidifier in your bedroom or your home to help loosen secretions. If your cough is worse at night, try to sleep in a semi-upright position. Rest as needed. Keep all follow-up visits as told by your health care provider. This is important. Contact a health care provider if you: Have new symptoms. Cough up pus. Have a cough that does not get better after 2-3 weeks or gets worse. Cannot control your cough with cough suppressant medicines and you are losing sleep. Have pain that gets worse or pain that is not helped with medicine. Have a fever. Have unexplained weight loss. Have night sweats. Get help right away if: You cough up blood. You have difficulty breathing. Your heartbeat is very fast. These symptoms may represent a serious problem that is an emergency. Do not wait to see if the symptoms will go away. Get medical help right away. Call your local emergency services (911 in the [place] Do not drive yourself to the hospital. Summary Coughing is a reflex that clears your throat and your airways. It is normal to cough occasionally, but a cough that happens with other symptoms or lasts a long time may be a sign of a condition that needs treatment. Take over-the-counter and prescription medicines only as told by your health care provider. Always cover your mouth when you cough.  Contact a health care provider if you have new symptoms or a cough that does not get better after 2-3 weeks or gets worse. This information is not intended to replace advice given to you by your health care provider. Make sure you discuss any questions you have with your health care provider. Document Revised: 07/31/2018  Document Reviewed: 07/31/2018 Elsevier Patient Education  [place].

## 2021-07-29 NOTE — Telephone Encounter (Signed)
Noted, thanks!

## 2021-07-29 NOTE — Progress Notes (Signed)
Virtual Visit via Video Note  I connected with Antonio Wells on 07/29/21 at 8:38 AM by a video enabled telemedicine application and verified that I am speaking with the correct person using two identifiers.  Patient location: home with wife, consent obtained.  My location: office [place].    I discussed the limitations, risks, security and privacy concerns of performing an evaluation and management service by telephone and the availability of in person appointments. I also discussed with the patient that there may be a patient responsible charge related to this service. The patient expressed understanding and agreed to proceed, consent obtained  Chief complaint:  Chief Complaint  Patient presents with   Cough    Pt reports cough started 4 days ago, has progressively worsened, voice hoarse, sore throat, seen UC 2 days ago given OTC cough syrup reports not helpful, was also given inhaler and prednisone as well no improvement, mixed cough     History of Present Illness: Antonio Wells is a 76 y.o. male  Cough: Started 4 days ago.  Seen at [facility] urgent care January 2.  Note reviewed.  Cough, wheezing reported.  Chest wall pain.  Tachycardic with slight elevated blood pressure at that time.  Flu test was negative, chest x-ray clear.  COVID testing was declined (home test negative that morning).  Treated for asthma/COPD as former smoker with prednisone 40 mg daily x3 days, albuterol inhaler, guaifenesin codeine cough syrup.   Cough has persisted, now with some hoarseness of his voice. Using inhaler - every 4 hours, no difference, but wheezing has resolved. Did not take prednisone- concerned about hiccups and vomiting with use in past.  No fever.  Drinking fluids.  No chest pains or dyspnea, no confusion/disorientation.  Minimal phlegm.  Used otc cough syrup with guafenesin, dextromethorphan, acetaminophen, phenylephrine. No sick contacts or exposure to covid known. 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  Waking up every 2 hours at night. No relief with cough syrup form Urgent Care.  Tried mucinex last night. Min relief with single.   COVID-19 vaccination primary series in February 2021, booster April 02, 2020, 12/04/2020.   Patient Active Problem List   Diagnosis Date Noted   Syncope 02/15/2020   Bradycardia 02/15/2020   Hypertensive encephalopathy 10/19/2018   Chronic kidney disease, stage 3 10/19/2018   DM (diabetes mellitus), type 2 with renal complications ([place]) 37/04/6268   Cough 03/17/2018   Lower resp. 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   CAD (coronary artery disease) 05/25/2017   Unstable angina (HCC)    Hypertensive heart disease 07/14/2015   Lower extremity edema 07/14/2015   Aortic stenosis 01/09/2015   OSA on CPAP 12/12/2014   Hypersomnia with sleep apnea 12/12/2014   Obstructive sleep apnea (adult) (pediatric) 02/04/2014   Hypersomnia, persistent 02/04/2014   Obesity 02/04/2014   Mitral regurgitation and aortic stenosis 01/02/2014   DOE (dyspnea on exertion) 11/26/2013   Gout 08/12/2011   Hypertension 08/12/2011   High cholesterol 08/12/2011   OSA (obstructive sleep apnea) 08/12/2011   Diabetes type 2, uncontrolled 08/12/2011   Past Medical History:  Diagnosis Date   Arthritis    "knees" (07/11/2017)   CKD (chronic kidney disease), stage III ([place])    lov dr Thurmond [facility] [place] 04-23-2020 on  chart   Complication of anesthesia    post op afibe after cabg x 2 2018   Coronary artery disease    a.  CABGx3 in 05/2017.   Gout    Heart murmur    mild, saw dr Lovena Le 03-26-2020 f/u prn   High cholesterol    Hypertension    Mild aortic stenosis    Mild mitral regurgitation    OSA on CPAP    last used cpap 3 weeks  ago   Phimosis    Postoperative atrial fibrillation ([place]) 06/2017   Sleep apnea    uses CPAP   Type II diabetes mellitus [facility])    Past Surgical History:  Procedure Laterality Date   CARDIAC CATHETERIZATION     CIRCUMCISION N/A 06/27/2020   Procedure: CIRCUMCISION ADULT;  Surgeon: Ceasar Mons, MD;  [facility]  Service: Urology;  Laterality: N/A;   COLONOSCOPY  2008   CORONARY ARTERY BYPASS GRAFT N/A 06/24/2017   Procedure: CORONARY ARTERY BYPASS GRAFTING (CABG) x 3 ; Using Left Internal Mammary Artery and Right Great Saphenous Leg Vein Harvested Endoscopically;  Surgeon: Gaye Pollack, MD;  Location: [facility];  Service: Open Heart Surgery;  Laterality: N/A;   LEFT HEART CATH AND CORONARY ANGIOGRAPHY N/A 05/25/2017   Procedure: LEFT HEART CATH AND CORONARY ANGIOGRAPHY;  Surgeon: Wellington Hampshire, MD;  Location: [facility];  Service: Cardiovascular;  Laterality: N/A;   TEE WITHOUT CARDIOVERSION N/A 06/24/2017   Procedure: TRANSESOPHAGEAL ECHOCARDIOGRAM (TEE);  Surgeon: Gaye Pollack, MD;  Location: [facility];  Service: Open Heart Surgery;  Laterality: N/A;   Allergies  Allergen Reactions   Penicillins Other (See Comments)    Has patient had a PCN reaction causing immediate rash, facial/tongue/throat swelling, SOB or lightheadedness with hypotension: No Has patient had a PCN reaction causing severe rash involving mucus membranes or skin necrosis: No Has patient had a PCN reaction that required hospitalization: No Has patient had a PCN reaction occurring within the last 10 years: No If all of the above answers are "NO", then may proceed with Cephalosporin use.   Passed out ? SYNCOPE ?   Lisinopril Cough   Amiodarone Nausea And Vomiting   Amlodipine Other (See Comments)    Causes dizzines   Beta Adrenergic Blockers     Do not take beta blockers causes heart block with coreg   Oxycodone Other (See Comments)    hallucinations   Zofran  [Ondansetron Hcl] Other (See Comments)    Has an interaction with another medication pt takes    Cortisone Nausea And Vomiting and Other (See Comments)    HICCUPS   Tramadol Nausea And Vomiting and Other (See Comments)    HICCUPS   Prior to Admission medications   Medication Sig Start Date End Date Taking? Authorizing Provider  acetaminophen (TYLENOL) 325 MG tablet Take 650 mg by mouth every 6 (six) hours as needed for mild pain.   Yes [provider]  albuterol (VENTOLIN HFA) 108 (90 Base) MCG/ACT inhaler Inhale 1-2 puffs into the lungs every 4 (four) hours as needed for wheezing or shortness of breath. 07/27/21  Yes Barrett Henle, MD  aspirin EC 81 MG tablet Take 1 tablet (81 mg total) by mouth daily. 09/02/17  Yes Dunn, Dayna N, PA-C  atorvastatin (LIPITOR) 80 MG tablet TAKE 1 TABLET BY MOUTH  DAILY AT 6 PM 07/09/21  Yes [name], MD  chlorthalidone (HYGROTON) 25 MG tablet Take 12.5 mg  by mouth every morning.  09/10/19  Yes [provider]  colchicine 0.6 MG tablet TAKE 2 TABS ON ONSET OF GOUT FLARE THEN REPEAT IN 1 HOUR OF ACUTE GOUT ATTACK 07/06/19  Yes [name], MD  glucose blood test strip Use as instructed 01/21/20  Yes [name], MD  guaiFENesin (MUCINEX) 600 MG 12 hr tablet Take 600 mg by mouth 2 (two) times daily as needed (congestion, cough).    Yes [provider]  guaiFENesin-codeine (CHERATUSSIN AC) 100-10 MG/5ML syrup Take 5 mLs by mouth 4 (four) times daily as needed for cough. 07/27/21  Yes Banister, Gwenlyn Perking, MD  hydrALAZINE (APRESOLINE) 50 MG tablet TAKE 1 TABLET BY MOUTH IN  THE MORNING THEN TAKE 2  TABLETS BY MOUTH IN THE  EVENING DAILY. 01/06/21  Yes Jerline Pain, MD  irbesartan (AVAPRO) 300 MG tablet Take 300 mg by mouth daily.   Yes [provider]  isosorbide mononitrate (IMDUR) 60 MG 24 hr tablet TAKE 1 TABLET BY MOUTH AT  BEDTIME 11/21/20  Yes Allred, Jeneen Rinks, MD  metFORMIN (GLUCOPHAGE) 500 MG tablet Take 1  tablet (500 mg total) by mouth 2 (two) times daily with a meal. 07/09/21  Yes [name], MD  predniSONE (DELTASONE) 20 MG tablet Take 2 tablets (40 mg total) by mouth daily with breakfast for 3 days. 07/27/21 07/30/21 Yes Banister, Gwenlyn Perking, MD  Lancets [facility] ULTRASOFT) lancets 1 each by Other route 1 day or 1 dose for 1 dose. Use as instructed 01/30/20 01/01/21  [name], MD   Social History   Socioeconomic History   Marital status: Married    Spouse name: [name]   Number of children: 3   Years of education: 16   Highest education level: Not on file  Occupational History   Occupation: Musician  Tobacco Use   Smoking status: Former    Packs/day: 2.00    Years: 15.00    Pack years: 30.00    Types: Cigarettes    Quit date: 07/26/1998    Years since quitting: 23.0   Smokeless tobacco: Never  Vaping Use   Vaping Use: Never used  Substance and Sexual Activity   Alcohol use: Not Currently    Alcohol/week: 0.0 standard drinks   Drug use: No   Sexual activity: Yes  Other Topics Concern   Not on file  Social History Narrative         Social Determinants of Health   Financial Resource Strain: Not on file  Food Insecurity: Not on file  Transportation Needs: Not on file  Physical Activity: Not on file  Stress: Not on file  Social Connections: Not on file  Intimate Partner Violence: Not on file    Observations/Objective: There were no vitals filed for this visit. Nontoxic appearance on video, speaking in full sentences without audible wheeze or respiratory distress.  Few coughs during visit.  Coherent responses, euthymic mood.  All questions answered with understanding of plan expressed.   Assessment and Plan: Acute cough - Plan: Promethazine-Codeine 6.25-10 MG/5ML SOLN, benzonatate (TESSALON) 100 MG capsule  Viral infection - Plan: Promethazine-Codeine 6.25-10 MG/5ML SOLN, benzonatate (TESSALON) 100 MG capsule Suspected viral URI with possible initial reactive  airway/asthma flare but wheezing has resolved without use of prednisone.  Afebrile.  No red flags on current symptoms.  -Flu testing negative, chest x-ray negative initially, initial home COVID testing negative but repeat testing today explained.  Call with results as still within window for antivirals  if positive.  - Concerns regarding prednisone and previous side effects discussed as above.  Can taper off albuterol then in person evaluation if wheezing returns.  -Phenergan with codeine cough syrup at bedtime, Mucinex or Mucinex DM during the day, Tessalon Perles during the day if needed.  Rest, fluids, ER/RTC in person follow-up precautions discussed with understanding expressed. Follow Up Instructions: As needed with in person follow-up if not improving/worsening.   I discussed the assessment and treatment plan with the patient. The patient was provided an opportunity to ask questions and all were answered. The patient agreed with the plan and demonstrated an understanding of the instructions.   The patient was advised to call back or seek an in-person evaluation if the symptoms worsen or if the condition fails to improve as anticipated.    [name], MD

## 2021-07-29 NOTE — Telephone Encounter (Signed)
Caller name:Woodson Ouida Sills   On DPR? :Yes  Call back [phone]  Provider they see: Carlota Raspberry   Reason for call:Covid test was negative Dr Carlota Raspberry had appt with him this morning and he wanted to know the results of the test

## 2021-07-29 NOTE — Telephone Encounter (Signed)
Pt called back stating we can disregard this they got the medication in and are going to fill it for the pt.

## 2021-07-29 NOTE — Telephone Encounter (Signed)
Pt called in stating that the Promethazine is on back order at [facility] on [address]. He wanted to know if we can send the medication to the [facility] on [address]. He is going to call the pharmacy to see if they have the medication in stock.  Please advise

## 2021-07-29 NOTE — Telephone Encounter (Signed)
Pt called back stating that the pharmacies no longer carry the  promethazine. Please advise if something else can be sent in for the pt

## 2021-08-02 ENCOUNTER — Encounter (HOSPITAL_COMMUNITY): Payer: Self-pay | Admitting: Emergency Medicine

## 2021-08-02 ENCOUNTER — Emergency Department (HOSPITAL_COMMUNITY): Payer: Medicare Other

## 2021-08-02 ENCOUNTER — Emergency Department (HOSPITAL_COMMUNITY)
Admission: EM | Admit: 2021-08-02 | Discharge: 2021-08-02 | Disposition: A | Discharge status: Home / Self Care | Payer: Medicare Other | Attending: Emergency Medicine | Admitting: Emergency Medicine

## 2021-08-02 DIAGNOSIS — Z79899 Other long term (current) drug therapy: Secondary | ICD-10-CM | POA: Insufficient documentation

## 2021-08-02 DIAGNOSIS — R059 Cough, unspecified: Secondary | ICD-10-CM | POA: Diagnosis present

## 2021-08-02 DIAGNOSIS — M542 Cervicalgia: Secondary | ICD-10-CM | POA: Insufficient documentation

## 2021-08-02 DIAGNOSIS — Z7982 Long term (current) use of aspirin: Secondary | ICD-10-CM | POA: Diagnosis not present

## 2021-08-02 DIAGNOSIS — Z7984 Long term (current) use of oral hypoglycemic drugs: Secondary | ICD-10-CM | POA: Diagnosis not present

## 2021-08-02 DIAGNOSIS — R051 Acute cough: Secondary | ICD-10-CM | POA: Diagnosis not present

## 2021-08-02 DIAGNOSIS — R0602 Shortness of breath: Secondary | ICD-10-CM | POA: Insufficient documentation

## 2021-08-02 DIAGNOSIS — M546 Pain in thoracic spine: Secondary | ICD-10-CM | POA: Insufficient documentation

## 2021-08-02 DIAGNOSIS — R0981 Nasal congestion: Secondary | ICD-10-CM | POA: Diagnosis not present

## 2021-08-02 DIAGNOSIS — Z20822 Contact with and (suspected) exposure to covid-19: Secondary | ICD-10-CM | POA: Diagnosis not present

## 2021-08-02 DIAGNOSIS — Z87891 Personal history of nicotine dependence: Secondary | ICD-10-CM | POA: Insufficient documentation

## 2021-08-02 LAB — CBC WITH DIFFERENTIAL/PLATELET
Abs Immature Granulocytes: 0.05 10*3/uL (ref 0.00–0.07)
Basophils Absolute: 0 10*3/uL (ref 0.0–0.1)
Basophils Relative: 0 %
Eosinophils Absolute: 0.1 10*3/uL (ref 0.0–0.5)
Eosinophils Relative: 2 %
HCT: 39.3 % (ref 39.0–52.0)
Hemoglobin: 12.6 g/dL — ABNORMAL LOW (ref 13.0–17.0)
Immature Granulocytes: 1 %
Lymphocytes Relative: 39 %
Lymphs Abs: 2.2 10*3/uL (ref 0.7–4.0)
MCH: 30.4 pg (ref 26.0–34.0)
MCHC: 32.1 g/dL (ref 30.0–36.0)
MCV: 94.7 fL (ref 80.0–100.0)
Monocytes Absolute: 0.6 10*3/uL (ref 0.1–1.0)
Monocytes Relative: 10 %
Neutro Abs: 2.7 10*3/uL (ref 1.7–7.7)
Neutrophils Relative %: 48 %
Platelets: 226 10*3/uL (ref 150–400)
RBC: 4.15 MIL/uL — ABNORMAL LOW (ref 4.22–5.81)
RDW: 12.9 % (ref 11.5–15.5)
WBC: 5.7 10*3/uL (ref 4.0–10.5)
nRBC: 0 % (ref 0.0–0.2)

## 2021-08-02 LAB — COMPREHENSIVE METABOLIC PANEL
ALT: 23 U/L (ref 0–44)
AST: 29 U/L (ref 15–41)
Albumin: 3.9 g/dL (ref 3.5–5.0)
Alkaline Phosphatase: 51 U/L (ref 38–126)
Anion gap: 11 (ref 5–15)
BUN: 18 mg/dL (ref 8–23)
CO2: 27 mmol/L (ref 22–32)
Calcium: 9.2 mg/dL (ref 8.9–10.3)
Chloride: 102 mmol/L (ref 98–111)
Creatinine, Ser: 1.45 mg/dL — ABNORMAL HIGH (ref 0.61–1.24)
GFR, Estimated: 51 mL/min — ABNORMAL LOW (ref >60)
Glucose, Bld: 146 mg/dL — ABNORMAL HIGH (ref 70–99)
Potassium: 4.1 mmol/L (ref 3.5–5.1)
Sodium: 140 mmol/L (ref 135–145)
Total Bilirubin: 0.6 mg/dL (ref 0.3–1.2)
Total Protein: 7.1 g/dL (ref 6.5–8.1)

## 2021-08-02 LAB — RESP PANEL BY RT-PCR (FLU A&B, COVID) ARPGX2
Influenza A by PCR: NEGATIVE
Influenza B by PCR: NEGATIVE
SARS Coronavirus 2 by RT PCR: NEGATIVE

## 2021-08-02 LAB — BRAIN NATRIURETIC PEPTIDE: B Natriuretic Peptide: 40.7 pg/mL (ref 0.0–100.0)

## 2021-08-02 MED ORDER — CORICIDIN HBP COUGH/COLD 4-30 MG PO TABS: ORAL_TABLET | ORAL | 0 refills | Status: DC

## 2021-08-02 NOTE — ED Provider Triage Note (Signed)
Emergency Medicine Provider Triage Evaluation Note  Antonio Wells , a 76 y.o. male  was evaluated in triage.  Pt complains of shortness of breath x1 week.  Patient states he has had a productive cough, was seen at urgent care and was given an inhaler, cough syrup, and prednisone.  He did have a chest x-ray on Monday that was normal.  He states that his symptoms have been getting worse.  Review of Systems  Positive: Productive cough, shortness of breath, nasal congestion Negative: Fever, chills, chest pain  Physical Exam  BP (!) 181/84 (BP Location: Right Arm)    Pulse 100    Temp 98.3 F (36.8 C) (Oral)    Resp 18    SpO2 98%  Gen:   Awake, no distress   Resp:  Normal effort  MSK:   Moves extremities without difficulty  Other:    Medical Decision Making  Medically screening exam initiated at 10:47 AM.  Appropriate orders placed.  [name] was informed that the remainder of the evaluation will be completed by another provider, this initial triage assessment does not replace that evaluation, and the importance of remaining in the ED until their evaluation is complete.  Will obtain additional CXR and test for Flu/COVID   Ursala Cressy T, PA-C 08/02/21 1048

## 2021-08-02 NOTE — ED Provider Notes (Signed)
[facility] EMERGENCY DEPARTMENT Provider Note   CSN: [id number] Arrival date & time: 08/02/21  1035     History  Chief Complaint  Patient presents with   Cough   Shortness of Breath    Antonio Wells is a 76 y.o. male.  HPI Patient presents with his wife who assists with history. Who presents with cough, congestion, discomfort from the mid thorax anteriorly to the neck and throat.  Onset was about 1 week ago.  No relief with outpatient visits with urgent care, OTC medication.  Patient does have multiple medical issues, states that he was a does well prior to the onset of illness about a week ago.  Since that time symptoms have been persistent, as above.  No fever, no vomiting, no diarrhea, no abdominal pain.  Is a former smoker, though he quit about 30 years ago.    Home Medications Prior to Admission medications   Medication Sig Start Date End Date Taking? Authorizing Provider  acetaminophen (TYLENOL) 325 MG tablet Take 650 mg by mouth every 6 (six) hours as needed for mild pain.    [provider]  albuterol (VENTOLIN HFA) 108 (90 Base) MCG/ACT inhaler Inhale 1-2 puffs into the lungs every 4 (four) hours as needed for wheezing or shortness of breath. 07/27/21   Barrett Henle, MD  aspirin EC 81 MG tablet Take 1 tablet (81 mg total) by mouth daily. 09/02/17   Dunn, Nedra Hai, PA-C  atorvastatin (LIPITOR) 80 MG tablet TAKE 1 TABLET BY MOUTH  DAILY AT 6 PM 07/09/21   [name], MD  benzonatate (TESSALON) 100 MG capsule Take 1 capsule (100 mg total) by mouth 3 (three) times daily as needed for cough. 07/29/21   [name], MD  chlorthalidone (HYGROTON) 25 MG tablet Take 12.5 mg by mouth every morning.  09/10/19   [provider]  colchicine 0.6 MG tablet TAKE 2 TABS ON ONSET OF GOUT FLARE THEN REPEAT IN 1 HOUR OF ACUTE GOUT ATTACK 07/06/19   [name], MD  glucose blood test strip Use as instructed 01/21/20   [name], MD   guaiFENesin (MUCINEX) 600 MG 12 hr tablet Take 600 mg by mouth 2 (two) times daily as needed (congestion, cough).     [provider]  hydrALAZINE (APRESOLINE) 50 MG tablet TAKE 1 TABLET BY MOUTH IN  THE MORNING THEN TAKE 2  TABLETS BY MOUTH IN THE  EVENING DAILY. 01/06/21   Jerline Pain, MD  irbesartan (AVAPRO) 300 MG tablet Take 300 mg by mouth daily.    [provider]  isosorbide mononitrate (IMDUR) 60 MG 24 hr tablet TAKE 1 TABLET BY MOUTH AT  BEDTIME 11/21/20   Allred, Jeneen Rinks, MD  Lancets [facility] ULTRASOFT) lancets 1 each by Other route 1 day or 1 dose for 1 dose. Use as instructed 01/30/20 01/01/21  [name], MD  metFORMIN (GLUCOPHAGE) 500 MG tablet Take 1 tablet (500 mg total) by mouth 2 (two) times daily with a meal. 07/09/21   [name], MD  Promethazine-Codeine 6.25-10 MG/5ML SOLN Take 5 mLs by mouth at bedtime as needed. 07/29/21   [name], MD      Allergies    Penicillins, Lisinopril, Amiodarone, Amlodipine, Beta adrenergic blockers, Oxycodone, Zofran [ondansetron hcl], Cortisone, and Tramadol    Review of Systems   Review of Systems  Constitutional:        Per HPI, otherwise negative  HENT:  Per HPI, otherwise negative  Respiratory:         Per HPI, otherwise negative  Cardiovascular:        Per HPI, otherwise negative  Gastrointestinal:  Negative for vomiting.  Endocrine:       Negative aside from HPI  Genitourinary:        Neg aside from HPI   Musculoskeletal:        Per HPI, otherwise negative  Skin: Negative.   Neurological:  Negative for syncope.   Physical Exam Updated Vital Signs BP (!) 155/73 (BP Location: Right Arm)    Pulse 82    Temp 98.7 F (37.1 C)    Resp 13    SpO2 97%  Physical Exam Vitals and nursing note reviewed.  Constitutional:      General: He is not in acute distress.    Appearance: He is well-developed.  HENT:     Head: Normocephalic and atraumatic.  Eyes:     Conjunctiva/sclera:  Conjunctivae normal.  Cardiovascular:     Rate and Rhythm: Normal rate and regular rhythm.  Pulmonary:     Effort: Pulmonary effort is normal. No respiratory distress.     Breath sounds: No stridor.  Abdominal:     General: There is no distension.  Skin:    General: Skin is warm and dry.  Neurological:     Mental Status: He is alert and oriented to person, place, and time.    ED Results / Procedures / Treatments   Labs (all labs ordered are listed, but only abnormal results are displayed) Labs Reviewed  COMPREHENSIVE METABOLIC PANEL - Abnormal; Notable for the following components:      Result Value   Glucose, Bld 146 (*)    Creatinine, Ser 1.45 (*)    GFR, Estimated 51 (*)    All other components within normal limits  CBC WITH DIFFERENTIAL/PLATELET - Abnormal; Notable for the following components:   RBC 4.15 (*)    Hemoglobin 12.6 (*)    All other components within normal limits  RESP PANEL BY RT-PCR (FLU A&B, COVID) ARPGX2  BRAIN NATRIURETIC PEPTIDE    EKG EKG Interpretation  Date/Time:  Sunday August 02 2021 10:44:15 EST Ventricular Rate:  98 PR Interval:  134 QRS Duration: 82 QT Interval:  344 QTC Calculation: 439 R Axis:   -14 Text Interpretation: Normal sinus rhythm Possible Left atrial enlargement Possible Anterior infarct , age undetermined Abnormal ECG When compared with ECG of 15-Feb-2020 12:39, PREVIOUS ECG IS PRESENT Confirmed by Carmin Muskrat [phone]) on 08/02/2021 3:00:56 PM  Radiology DG Chest 2 View  Result Date: 08/02/2021 CLINICAL DATA:  Shortness of breath and cough. EXAM: CHEST - 2 VIEW COMPARISON:  07/27/2021 FINDINGS: Stable asymmetric elevation right hemidiaphragm. The lungs are clear without focal pneumonia, edema, pneumothorax or pleural effusion. The cardiopericardial silhouette is within normal limits for size. The visualized bony structures of the thorax show no acute abnormality. IMPRESSION: No active disease. Electronically Signed   By: Misty Stanley M.D.   On: 08/02/2021 11:22    Procedures Procedures    Medications Ordered in ED Medications - No data to display  ED Course/ Medical Decision Making/ A&P  4:40 PM Patient awake, alert, in no distress.  No increased work of breathing.                         Medical Decision Making  Adult male presents with his wife who assists with history.  Patient is presenting with ongoing cough.  Here patient found of no new oxygen requirement with saturation 97% on room air.  He has no appreciable tachycardia, heart rate in the 80s, sinus.  Patient's x-ray interpreted by me independently, unremarkable, no fluid overload, no pneumonia. Labs interpreted by me, reassuring, no leukocytosis, no appreciable anemia, no electrolyte abnormalities beyond mild hyperglycemia.  BNP is normal, consistent with low suspicion for new heart failure.  COVID test, influenza test, reviewed, discussed, negative.  Given his reassuring findings, patient is appropriate for close outpatient management.  We had a lengthy conversation about appropriate medications, patient discharged in stable condition.        Final Clinical Impression(s) / ED Diagnoses Final diagnoses:  Acute cough    Rx / DC Orders ED Discharge Orders          Ordered    Chlorpheniramine-DM (CORICIDIN COUGH/COLD) 4-30 MG TABS        08/02/21 1642              Carmin Muskrat, MD 08/02/21 1643

## 2021-08-02 NOTE — Discharge Instructions (Addendum)
As discussed, today's evaluation has been generally reassuring.  There is currently no evidence for influenza or COVID.  Similarly, your x-ray did not demonstrate pneumonia.  Please be sure to take your Tessalon and Coricidin as prescribed.  In addition, for the next 2 days use albuterol every 4 hours and take 40 mg of steroid daily.  Return here for concerning changes in your condition.

## 2021-08-02 NOTE — ED Triage Notes (Signed)
Pt here for persistent cough x1 week and SOB for a few days. Pt has been taking codeine cough syrup, albuterol inhaler and prednisone. Recent CXR Monday.

## 2021-08-04 ENCOUNTER — Telehealth: Payer: Medicare Other | Admitting: Nurse Practitioner

## 2021-08-04 DIAGNOSIS — J40 Bronchitis, not specified as acute or chronic: Secondary | ICD-10-CM | POA: Diagnosis not present

## 2021-08-04 MED ORDER — DOXYCYCLINE HYCLATE 100 MG PO TABS: 100.0000 mg | ORAL_TABLET | Freq: Two times a day (BID) | ORAL | 0 refills | Status: AC

## 2021-08-04 NOTE — Progress Notes (Signed)
Virtual Visit Consent   TAKASHI KOROL, you are scheduled for a virtual visit with a [facility] provider today.     Just as with appointments in the office, your consent must be obtained to participate.  Your consent will be active for this visit and any virtual visit you may have with one of our providers in the next 365 days.     If you have a MyChart account, a copy of this consent can be sent to you electronically.  All virtual visits are billed to your insurance company just like a traditional visit in the office.    As this is a virtual visit, video technology does not allow for your provider to perform a traditional examination.  This may limit your provider's ability to fully assess your condition.  If your provider identifies any concerns that need to be evaluated in person or the need to arrange testing (such as labs, EKG, etc.), we will make arrangements to do so.     Although advances in technology are sophisticated, we cannot ensure that it will always work on either your end or our end.  If the connection with a video visit is poor, the visit may have to be switched to a telephone visit.  With either a video or telephone visit, we are not always able to ensure that we have a secure connection.     I need to obtain your verbal consent now.   Are you willing to proceed with your visit today?    JORIEL STREETY has provided verbal consent on 08/04/2021 for a virtual visit (video or telephone).   Apolonio Schneiders, FNP   Date: 08/04/2021 12:50 PM   Virtual Visit via Video Note   I, Apolonio Schneiders, connected with  SAYER MASINI  ([id number], 1946/04/04) on 08/04/21 at 12:45 PM EST by a video-enabled telemedicine application and verified that I am speaking with the correct person using two identifiers.  Location: Patient: Virtual Visit Location Patient: Home Provider: Virtual Visit Location Provider: Home Office   I discussed the limitations of evaluation and management by  telemedicine and the availability of in person appointments. The patient expressed understanding and agreed to proceed.   Patient unable to connect over video- visit was performed over the phone   History of Present Illness: JAQUON GINGERICH is a 76 y.o. who identifies as a male who was assigned male at birth, and is being seen today for a cough that he cannot get rid of.  He was seen initially by PCP via video visit on 07/29/21 was then seen at the ED  Was in ER 08/02/21 normal chest xray  Negative COVID & flu testing Advised to continue managing symptoms with OTC medications for viral URI  Continues to have a cough that causes spasms   On 07/29/21 was given prescription cough medication by pcp & prednisone  He has also been using his Albuterol inhaler.  The cough is mostly dry but feels as though he needs to cough something up   Problems:  Patient Active Problem List   Diagnosis Date Noted   Syncope 02/15/2020   Bradycardia 02/15/2020   Hypertensive encephalopathy 10/19/2018   Chronic kidney disease, stage 3 10/19/2018   DM (diabetes mellitus), type 2 with renal complications ([place]) 37/04/6268   Cough 03/17/2018   Lower resp. 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   CAD (coronary artery disease) 05/25/2017   Unstable angina (HCC)    Hypertensive heart disease 07/14/2015   Lower extremity edema 07/14/2015   Aortic stenosis 01/09/2015   OSA on CPAP 12/12/2014   Hypersomnia with sleep apnea 12/12/2014   Obstructive sleep apnea (adult) (pediatric) 02/04/2014   Hypersomnia, persistent 02/04/2014   Obesity 02/04/2014   Mitral regurgitation and aortic stenosis 01/02/2014   DOE (dyspnea on exertion) 11/26/2013   Gout 08/12/2011   Hypertension 08/12/2011   High cholesterol  08/12/2011   OSA (obstructive sleep apnea) 08/12/2011   Diabetes type 2, uncontrolled 08/12/2011    Allergies:  Allergies  Allergen Reactions   Penicillins Other (See Comments)    Has patient had a PCN reaction causing immediate rash, facial/tongue/throat swelling, SOB or lightheadedness with hypotension: No Has patient had a PCN reaction causing severe rash involving mucus membranes or skin necrosis: No Has patient had a PCN reaction that required hospitalization: No Has patient had a PCN reaction occurring within the last 10 years: No If all of the above answers are "NO", then may proceed with Cephalosporin use.   Passed out ? SYNCOPE ?   Lisinopril Cough   Amiodarone Nausea And Vomiting   Amlodipine Other (See Comments)    Causes dizzines   Beta Adrenergic Blockers     Do not take beta blockers causes heart block with coreg   Oxycodone Other (See Comments)    hallucinations   Zofran [Ondansetron Hcl] Other (See Comments)    Has an interaction with another medication pt takes    Cortisone Nausea And Vomiting and Other (See Comments)    HICCUPS   Tramadol Nausea And Vomiting and Other (See Comments)    HICCUPS   Medications:  Current Outpatient Medications:    acetaminophen (TYLENOL) 325 MG tablet, Take 650 mg by mouth every 6 (six) hours as needed for mild pain., Disp: , Rfl:    albuterol (VENTOLIN HFA) 108 (90 Base) MCG/ACT inhaler, Inhale 1-2 puffs into the lungs every 4 (four) hours as needed for wheezing or shortness of breath., Disp: 1 each, Rfl: 0   aspirin EC 81 MG tablet, Take 1 tablet (81 mg total) by mouth daily., Disp: 90 tablet, Rfl: 3   atorvastatin (LIPITOR) 80 MG tablet, TAKE 1 TABLET BY MOUTH  DAILY AT 6 PM, Disp: 90 tablet, Rfl: 3   benzonatate (TESSALON) 100 MG capsule, Take 1 capsule (100 mg total) by mouth 3 (three) times daily as needed for cough., Disp: 20 capsule, Rfl: 0   Chlorpheniramine-DM (CORICIDIN COUGH/COLD) 4-30 MG TABS, Take as directed on the  packaging, Disp: 30 tablet, Rfl: 0   chlorthalidone (HYGROTON) 25 MG tablet, Take 12.5 mg by mouth every morning. , Disp: , Rfl:    colchicine 0.6 MG tablet, TAKE 2 TABS ON ONSET OF GOUT FLARE THEN REPEAT IN 1 HOUR OF ACUTE GOUT ATTACK, Disp: 30 tablet, Rfl: 0   glucose blood test strip, Use as instructed, Disp: 100 each, Rfl: 12   guaiFENesin (MUCINEX) 600 MG 12 hr tablet, Take 600 mg by mouth 2 (two) times daily as needed (congestion, cough). , Disp: , Rfl:    hydrALAZINE (APRESOLINE) 50 MG tablet, TAKE 1 TABLET BY MOUTH IN  THE MORNING THEN TAKE 2  TABLETS BY MOUTH IN THE  EVENING DAILY., Disp: 270 tablet, Rfl: 3   irbesartan (AVAPRO) 300 MG tablet, Take 300  mg by mouth daily., Disp: , Rfl:    isosorbide mononitrate (IMDUR) 60 MG 24 hr tablet, TAKE 1 TABLET BY MOUTH AT  BEDTIME, Disp: 90 tablet, Rfl: 3   Lancets (ONETOUCH ULTRASOFT) lancets, 1 each by Other route 1 day or 1 dose for 1 dose. Use as instructed, Disp: 100 each, Rfl: 12   metFORMIN (GLUCOPHAGE) 500 MG tablet, Take 1 tablet (500 mg total) by mouth 2 (two) times daily with a meal., Disp: 180 tablet, Rfl: 1   Promethazine-Codeine 6.25-10 MG/5ML SOLN, Take 5 mLs by mouth at bedtime as needed., Disp: 118 mL, Rfl: 0  Observations/Objective: Patient is well-developed, well-nourished in no acute distress.  Resting comfortably at home.  Head is normocephalic, atraumatic.  No labored breathing.  Speech is clear and coherent with logical content.  Patient is alert and oriented at baseline.    Assessment and Plan: 1. Bronchitis  - doxycycline (VIBRA-TABS) 100 MG tablet; Take 1 tablet (100 mg total) by mouth 2 (two) times daily for 7 days.  Dispense: 14 tablet; Refill: 0    Discussed continuing cough medication as prescribed  Continue to use Albuterol inhaler as needed Take medication with food If no improvement in 48 hours he should be re revaluated in person Earlier with any worsening symptoms as discussed  Follow Up  Instructions: I discussed the assessment and treatment plan with the patient. The patient was provided an opportunity to ask questions and all were answered. The patient agreed with the plan and demonstrated an understanding of the instructions.  A copy of instructions were sent to the patient via MyChart unless otherwise noted below.     The patient was advised to call back or seek an in-person evaluation if the symptoms worsen or if the condition fails to improve as anticipated.  Time:  I spent 15 minutes with the patient via telehealth technology discussing the above problems/concerns.    Apolonio Schneiders, FNP

## 2021-08-10 ENCOUNTER — Encounter: Payer: Self-pay | Admitting: Family Medicine

## 2021-08-10 ENCOUNTER — Ambulatory Visit (INDEPENDENT_AMBULATORY_CARE_PROVIDER_SITE_OTHER): Payer: Medicare Other | Admitting: Family Medicine

## 2021-08-10 VITALS — BP 128/76 | HR 73 | Temp 98.4°F | Resp 16 | Ht 63.0 in | Wt 181.6 lb

## 2021-08-10 DIAGNOSIS — J40 Bronchitis, not specified as acute or chronic: Secondary | ICD-10-CM

## 2021-08-10 DIAGNOSIS — R062 Wheezing: Secondary | ICD-10-CM | POA: Diagnosis not present

## 2021-08-10 DIAGNOSIS — R051 Acute cough: Secondary | ICD-10-CM | POA: Diagnosis not present

## 2021-08-10 NOTE — Progress Notes (Signed)
Subjective:  Patient ID: Antonio Wells, male    DOB: 05/28/1946  Age: 76 y.o. MRN: [id number]  CC:  Chief Complaint  Patient presents with   Follow-up    Pt reports trip about 1 week ago, cough was mostly dry with coughing fits, SOB with fits, congestion, pt reports since abx has been feeling better, today last day for abx, congestion has been present as well     HPI Antonio Wells presents for   Cough/viral infection: Video visit January 4.  Suspected viral URI with possible initial reactive airway/asthma flare when seen at urgent care on January 2 but wheezing improved without use of prednisone.  COVID testing negative x2.  Flu testing negative and chest x-ray negative.  Was improving at his video visit. Did not want to complete prednisone.  Plan for tapering albuterol as tolerated, Phenergan with codeine cough syrup at bedtime, Mucinex during the day and Tessalon Per[place] if needed.  ER visit January 8.  Persistent cough, congestion.  Negative flu/COVID testing.  Chest x-ray without active disease.  CBC without leukocytosis, mild hyperglycemia, BNP normal with low suspicion for heart failure.  Continued outpatient monitoring recommended.  Virtual visit January 10.  Started on doxycycline, last dose today. Abx seemed to help. Cough has improved, minimal now.  Used albuterol until wheezing stopped few days ago. None past few days. No fever.  Overall better, eating and drinking ok, energy level better.    History Patient Active Problem List   Diagnosis Date Noted   Syncope 02/15/2020   Bradycardia 02/15/2020   Hypertensive encephalopathy 10/19/2018   Chronic kidney disease, stage 3 10/19/2018   DM (diabetes mellitus), type 2 with renal complications ([place]) 19/37/9024   Cough 03/17/2018   Lower resp. 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   CAD (coronary artery disease) 05/25/2017   Unstable angina (HCC)    Hypertensive heart disease 07/14/2015   Lower extremity edema 07/14/2015   Aortic stenosis 01/09/2015   OSA on CPAP 12/12/2014   Hypersomnia with sleep apnea 12/12/2014   Obstructive sleep apnea (adult) (pediatric) 02/04/2014   Hypersomnia, persistent 02/04/2014   Obesity 02/04/2014   Mitral regurgitation and aortic stenosis 01/02/2014   DOE (dyspnea on exertion) 11/26/2013   Gout 08/12/2011   Hypertension 08/12/2011   High cholesterol 08/12/2011   OSA (obstructive sleep apnea) 08/12/2011   Diabetes type 2, uncontrolled 08/12/2011   Past Medical History:  Diagnosis Date   Arthritis    "knees" (07/11/2017)   CKD (chronic kidney disease), stage III ([place])    lov dr Thurmond Butts [place] [phone] on chart   Complication of anesthesia    post op afibe after cabg x 2 2018   Coronary artery disease    a.  CABGx3 in 05/2017.   Gout    Heart murmur    mild, saw dr Lovena Le 03-26-2020 f/u prn   High cholesterol    Hypertension    Mild aortic stenosis    Mild mitral regurgitation    OSA on CPAP    last used cpap 3 weeks ago   Phimosis    Postoperative atrial fibrillation ([place]) 06/2017   Sleep apnea    uses CPAP   Type II diabetes mellitus ([place])    Past Surgical History:  Procedure  Laterality Date   CARDIAC CATHETERIZATION     CIRCUMCISION N/A 06/27/2020   Procedure: CIRCUMCISION ADULT;  Surgeon: Ceasar Mons, MD;  Location[facility];  Service: Urology;  Laterality: N/A;   COLONOSCOPY  2008   CORONARY ARTERY BYPASS GRAFT N/A 06/24/2017   Procedure: CORONARY ARTERY BYPASS GRAFTING (CABG) x 3 ; Using Left Internal Mammary Artery and Right Great Saphenous Leg Vein Harvested Endoscopically;  Surgeon: Gaye Pollack, MD;  Location: [place] [facility];  Service: Open Heart Surgery;  Laterality: N/A;   LEFT HEART CATH AND CORONARY ANGIOGRAPHY  N/A 05/25/2017   Procedure: LEFT HEART CATH AND CORONARY ANGIOGRAPHY;  Surgeon: Wellington Hampshire, MD;  Location: [place] [facility];  Service: Cardiovascular;  Laterality: N/A;   TEE WITHOUT CARDIOVERSION N/A 06/24/2017   Procedure: TRANSESOPHAGEAL ECHOCARDIOGRAM (TEE);  Surgeon: Gaye Pollack, MD;  Location: [facility];  Service: Open Heart Surgery;  Laterality: N/A;   Allergies  Allergen Reactions   Penicillins Other (See Comments)    Has patient had a PCN reaction causing immediate rash, facial/tongue/throat swelling, SOB or lightheadedness with hypotension: No Has patient had a PCN reaction causing severe rash involving mucus membranes or skin necrosis: No Has patient had a PCN reaction that required hospitalization: No Has patient had a PCN reaction occurring within the last 10 years: No If all of the above answers are "NO", then may proceed with Cephalosporin use.   Passed out ? SYNCOPE ?   Lisinopril Cough   Amiodarone Nausea And Vomiting   Amlodipine Other (See Comments)    Causes dizzines   Beta Adrenergic Blockers     Do not take beta blockers causes heart block with coreg   Oxycodone Other (See Comments)    hallucinations   Zofran [Ondansetron Hcl] Other (See Comments)    Has an interaction with another medication pt takes    Cortisone Nausea And Vomiting and Other (See Comments)    HICCUPS   Tramadol Nausea And Vomiting and Other (See Comments)    HICCUPS   Prior to Admission medications   Medication Sig Start Date End Date Taking? Authorizing Provider  acetaminophen (TYLENOL) 325 MG tablet Take 650 mg by mouth every 6 (six) hours as needed for mild pain.   Yes [provider]  albuterol (VENTOLIN HFA) 108 (90 Base) MCG/ACT inhaler Inhale 1-2 puffs into the lungs every 4 (four) hours as needed for wheezing or shortness of breath. 07/27/21  Yes Barrett Henle, MD  aspirin EC 81 MG tablet Take 1 tablet (81 mg total) by mouth daily. 09/02/17  Yes Dunn, Dayna N,  PA-C  atorvastatin (LIPITOR) 80 MG tablet TAKE 1 TABLET BY MOUTH  DAILY AT 6 PM 07/09/21  Yes [name], MD  benzonatate (TESSALON) 100 MG capsule Take 1 capsule (100 mg total) by mouth 3 (three) times daily as needed for cough. 07/29/21  Yes [name], MD  Chlorpheniramine-DM (CORICIDIN COUGH/COLD) 4-30 MG TABS Take as directed on the packaging 08/02/21  Yes Carmin Muskrat, MD  chlorthalidone (HYGROTON) 25 MG tablet Take 12.5 mg by mouth every morning.  09/10/19  Yes [provider]  colchicine 0.6 MG tablet TAKE 2 TABS ON ONSET OF GOUT FLARE THEN REPEAT IN 1 HOUR OF ACUTE GOUT ATTACK 07/06/19  Yes [name], MD  doxycycline (VIBRA-TABS) 100 MG tablet Take 1 tablet (100 mg total) by mouth 2 (two) times daily for 7 days. 08/04/21 08/11/21 Yes Apolonio Schneiders, FNP  glucose blood  test strip Use as instructed 01/21/20  Yes [name], MD  guaiFENesin (MUCINEX) 600 MG 12 hr tablet Take 600 mg by mouth 2 (two) times daily as needed (congestion, cough).    Yes [provider]  hydrALAZINE (APRESOLINE) 50 MG tablet TAKE 1 TABLET BY MOUTH IN  THE MORNING THEN TAKE 2  TABLETS BY MOUTH IN THE  EVENING DAILY. 01/06/21  Yes Jerline Pain, MD  irbesartan (AVAPRO) 300 MG tablet Take 300 mg by mouth daily.   Yes [provider]  isosorbide mononitrate (IMDUR) 60 MG 24 hr tablet TAKE 1 TABLET BY MOUTH AT  BEDTIME 11/21/20  Yes Allred, Jeneen Rinks, MD  metFORMIN (GLUCOPHAGE) 500 MG tablet Take 1 tablet (500 mg total) by mouth 2 (two) times daily with a meal. 07/09/21  Yes [name], MD  Lancets [facility] ULTRASOFT) lancets 1 each by Other route 1 day or 1 dose for 1 dose. Use as instructed 01/30/20 01/01/21  [name], MD  Promethazine-Codeine 6.25-10 MG/5ML SOLN Take 5 mLs by mouth at bedtime as needed. Patient not taking: Reported on 08/10/2021 07/29/21   [name], MD   Social History   Socioeconomic History   Marital status: Married    Spouse name:  [name]   Number of children: 3   Years of education: 16   Highest education level: Not on file  Occupational History   Occupation: Musician  Tobacco Use   Smoking status: Former    Packs/day: 2.00    Years: 15.00    Pack years: 30.00    Types: Cigarettes    Quit date: 07/26/1998    Years since quitting: 23.0   Smokeless tobacco: Never  Vaping Use   Vaping Use: Never used  Substance and Sexual Activity   Alcohol use: Not Currently    Alcohol/week: 0.0 standard drinks   Drug use: No   Sexual activity: Yes  Other Topics Concern   Not on file  Social History Narrative         Social Determinants of Health   Financial Resource Strain: Not on file  Food Insecurity: Not on file  Transportation Needs: Not on file  Physical Activity: Not on file  Stress: Not on file  Social Connections: Not on file  Intimate Partner Violence: Not on file    Review of Systems Per HPI;   Objective:   Vitals:   08/10/21 0904  BP: 128/76  Pulse: 73  Resp: 16  Temp: 98.4 F (36.9 C)  TempSrc: Temporal  SpO2: 97%  Weight: 181 lb 9.6 oz (82.4 kg)  Height: 5\' 3"  (1.6 m)     Physical Exam Vitals reviewed.  Constitutional:      Appearance: He is well-developed.  HENT:     Head: Normocephalic and atraumatic.  Neck:     Vascular: No carotid bruit or JVD.  Cardiovascular:     Rate and Rhythm: Normal rate and regular rhythm.     Heart sounds: Murmur (3/6 systolic murmur.) heard.  Pulmonary:     Effort: Pulmonary effort is normal.     Breath sounds: Normal breath sounds. No rales.  Musculoskeletal:     Right lower leg: No edema.     Left lower leg: No edema.  Skin:    General: Skin is warm and dry.  Neurological:     Mental Status: He is alert and oriented to person, place, and time.  Psychiatric:        Mood and Affect:  Mood normal.       Assessment & Plan:  Antonio Wells is a 76 y.o. male . Bronchitis  Acute cough  Wheeze  Likely viral syndrome with secondary  bronchitis.  ER notes, labs, studies noted, as well as video visit.  Improving now that he has been treated with doxycycline.  Wheezing has diminished and no recent need for albuterol.  Cough improving.  Lungs clear today, reassuring exam.  Has Tessalon, Robitussin at home if needed for cough, option of albuterol if wheezing returns with RTC precautions if worsening symptoms.  No orders of the defined types were placed in this encounter.  Patient Instructions  Sounds like you are improving. I expect you to continue to get better.  Albuterol if needed for wheeze, but if that is needed frequently or you are worsening - return for recheck. Robitussin or tessalon if needed for cough.  Return to the clinic or go to the nearest emergency room if any of your symptoms worsen or new symptoms occur.     Signed,   [name], MD [place] 08/10/21 9:33 AM

## 2021-08-10 NOTE — Patient Instructions (Addendum)
Sounds like you are improving. I expect you to continue to get better.  Albuterol if needed for wheeze, but if that is needed frequently or you are worsening - return for recheck. Robitussin or tessalon if needed for cough.  Return to the clinic or go to the nearest emergency room if any of your symptoms worsen or new symptoms occur.

## 2021-10-21 ENCOUNTER — Other Ambulatory Visit: Payer: Self-pay

## 2021-10-21 ENCOUNTER — Encounter: Payer: Self-pay | Admitting: Family Medicine

## 2021-10-21 ENCOUNTER — Ambulatory Visit (INDEPENDENT_AMBULATORY_CARE_PROVIDER_SITE_OTHER): Payer: Medicare Other | Admitting: Family Medicine

## 2021-10-21 VITALS — BP 116/66 | HR 88 | Temp 98.1°F | Resp 17 | Ht 63.0 in | Wt 185.0 lb

## 2021-10-21 DIAGNOSIS — E1122 Type 2 diabetes mellitus with diabetic chronic kidney disease: Secondary | ICD-10-CM | POA: Diagnosis not present

## 2021-10-21 DIAGNOSIS — R0609 Other forms of dyspnea: Secondary | ICD-10-CM | POA: Diagnosis not present

## 2021-10-21 DIAGNOSIS — N529 Male erectile dysfunction, unspecified: Secondary | ICD-10-CM | POA: Diagnosis not present

## 2021-10-21 LAB — GLUCOSE, POCT (MANUAL RESULT ENTRY): POC Glucose: 154 mg/dl — AB (ref 70–99)

## 2021-10-21 LAB — MICROALBUMIN / CREATININE URINE RATIO
Creatinine,U: 108.3 mg/dL
Microalb Creat Ratio: 7.7 mg/g (ref 0.0–30.0)
Microalb, Ur: 8.4 mg/dL — ABNORMAL HIGH (ref 0.0–1.9)

## 2021-10-21 LAB — POCT GLYCOSYLATED HEMOGLOBIN (HGB A1C): Hemoglobin A1C: 7.6 % — AB (ref 4.0–5.6)

## 2021-10-21 MED ORDER — SITAGLIPTIN PHOSPHATE 50 MG PO TABS: 50.0000 mg | ORAL_TABLET | Freq: Every day | ORAL | 1 refills | Status: DC

## 2021-10-21 NOTE — Patient Instructions (Addendum)
Add Januvia once per day if not too expensive - let me know. No change in metformin for now.  ?Recheck diabetes with fasting labs in 3 months.  ?Call your cardiologist about shortness of breath with stairs and I would also need them to comment if they feel like viagra would be safe for you to use.  ?Return to the clinic or go to the nearest emergency room if any of your symptoms worsen or new symptoms occur. ? ?

## 2021-10-21 NOTE — Progress Notes (Signed)
? ?Subjective:  ?Patient ID: Antonio Wells, male    DOB: [birth year]  Age: 76 y.o. MRN: [id number] ? ?CC:  ?Chief Complaint  ?Patient presents with  ? Diabetes  ?  Pt reports he tried some exercise and notes knee does not allow for this, notes he has tried to maintain a better diet   ? ? ?HPI ?Antonio Wells presents for  ? ?Diabetes: ?Complicated by chronic kidney disease, CAD.  Nephrology Dr. [name], cardiology Dr. [name].  Treated with statin, ARB and on metformin 500 mg twice daily.  He has been intolerant to higher doses.  We have discussed SGLT2 previously given his history of CAD but history of phimosis, balanitis and concerns with that med and possible mycotic infection risk. ?Has started exercising, then limited by some knee issues. Avoiding swimming after prior genital surgery.  ?No diet changes.  ?Diet Soda - 1-2 per day.usually 1.   ?Home readings: random - 118, 150. No 200's.  ?Microalbumin: due.  ?Optho, foot exam, pneumovax:  ?Pneumovax declined. Otherwise utd.  ? ?Lab Results  ?Component Value Date  ? HGBA1C 7.6 (A) 10/21/2021  ? HGBA1C 7.4 (H) 07/09/2021  ? HGBA1C 7.3 (H) 04/06/2021  ? ?Lab Results  ?Component Value Date  ? MICROALBUR 39.6 08/25/2015  ? [place] 55 07/09/2021  ? CREATININE 1.45 (H) 08/02/2021  ? ?Erectile dysfunction: ?For years, trouble initiating erection, Seems to be worse on meds. No new relationships or changes.  ?Shortness of breath with exercise at times. No chest pains.  ?Has CAD s/p CABG. cardiologist Dr. [name]. Last OV 12/2020.  ?Notes with walking up stairs to bed, lasts few seconds. Past few months. No symptoms with riding stationary bike. No CP.  ?Discussed ED with urology - injections discussed? Not interested.  ? ? ? ?History ?Patient Active Problem List  ? Diagnosis Date Noted  ? Syncope 02/15/2020  ? Bradycardia 02/15/2020  ? Hypertensive encephalopathy 10/19/2018  ? Chronic kidney disease, stage 3 10/19/2018  ? DM (diabetes mellitus), type 2 with renal  complications ([place]) 09/98/3382  ? Cough 03/17/2018  ? Lower resp. tract infection 03/17/2018  ? Sleep apnea with use of continuous positive airway pressure (CPAP) 01/04/2018  ? Malnutrition of moderate degree 07/14/2017  ? Thrush 07/11/2017  ? FTT (failure to thrive) in adult 07/11/2017  ? S/P CABG x 3 06/24/2017  ? CAD (coronary artery disease) 05/25/2017  ? Unstable angina (HCC)   ? Hypertensive heart disease 07/14/2015  ? Lower extremity edema 07/14/2015  ? Aortic stenosis 01/09/2015  ? OSA on CPAP 12/12/2014  ? Hypersomnia with sleep apnea 12/12/2014  ? Obstructive sleep apnea (adult) (pediatric) 02/04/2014  ? Hypersomnia, persistent 02/04/2014  ? Obesity 02/04/2014  ? Mitral regurgitation and aortic stenosis 01/02/2014  ? DOE (dyspnea on exertion) 11/26/2013  ? Gout 08/12/2011  ? Hypertension 08/12/2011  ? High cholesterol 08/12/2011  ? OSA (obstructive sleep apnea) 08/12/2011  ? Diabetes type 2, uncontrolled 08/12/2011  ? ?Past Medical History:  ?Diagnosis Date  ? Arthritis   ? "knees" (07/11/2017)  ? CKD (chronic kidney disease), stage III ([place])   ? lov dr Thurmond Butts [place] 04-23-2020 on chart  ? Complication of anesthesia   ? post op afibe after cabg x 2 2018  ? Coronary artery disease   ? a.  CABGx3 in 05/2017.  ? Gout   ? Heart murmur   ? mild, saw dr Lovena Le 03-26-2020 f/u prn  ? High cholesterol   ? Hypertension   ? Mild aortic  stenosis   ? Mild mitral regurgitation   ? OSA on CPAP   ? last used cpap 3 weeks ago  ? Phimosis   ? Postoperative atrial fibrillation ([place]) 06/2017  ? Sleep apnea   ? uses CPAP  ? Type II diabetes mellitus ([place])   ? ?Past Surgical History:  ?Procedure Laterality Date  ? CARDIAC CATHETERIZATION    ? CIRCUMCISION N/A 06/27/2020  ? Procedure: CIRCUMCISION ADULT;  Surgeon: Ceasar Mons, MD;  Location: [facility];  Service: Urology;  Laterality: N/A;  ? COLONOSCOPY  2008  ? CORONARY ARTERY BYPASS GRAFT N/A 06/24/2017  ? Procedure: CORONARY ARTERY BYPASS GRAFTING  (CABG) x 3 ; Using Left Internal Mammary Artery and Right Great Saphenous Leg Vein Harvested Endoscopically;  Surgeon: Gaye Pollack, MD;  Location: [facility];  Service: Open Heart Surgery;  Laterality: N/A;  ? LEFT HEART CATH AND CORONARY ANGIOGRAPHY N/A 05/25/2017  ? Procedure: LEFT HEART CATH AND CORONARY ANGIOGRAPHY;  Surgeon: Wellington Hampshire, MD;  Location: [place] [facility];  Service: Cardiovascular;  Laterality: N/A;  ? TEE WITHOUT CARDIOVERSION N/A 06/24/2017  ? Procedure: TRANSESOPHAGEAL ECHOCARDIOGRAM (TEE);  Surgeon: Gaye Pollack, MD;  Location: [facility];  Service: Open Heart Surgery;  Laterality: N/A;  ? ?Allergies  ?Allergen Reactions  ? Penicillins Other (See Comments)  ?  Has patient had a PCN reaction causing immediate rash, facial/tongue/throat swelling, SOB or lightheadedness with hypotension: No ?Has patient had a PCN reaction causing severe rash involving mucus membranes or skin necrosis: No ?Has patient had a PCN reaction that required hospitalization: No ?Has patient had a PCN reaction occurring within the last 10 years: No ?If all of the above answers are "NO", then may proceed with Cephalosporin use. ? ? ?Passed out ?? SYNCOPE ?  ? Lisinopril Cough  ? Amiodarone Nausea And Vomiting  ? Amlodipine Other (See Comments)  ?  Causes dizzines  ? Beta Adrenergic Blockers   ?  Do not take beta blockers causes heart block with coreg  ? Oxycodone Other (See Comments)  ?  hallucinations  ? Zofran [Ondansetron Hcl] Other (See Comments)  ?  Has an interaction with another medication pt takes   ? Cortisone Nausea And Vomiting and Other (See Comments)  ?  HICCUPS  ? Tramadol Nausea And Vomiting and Other (See Comments)  ?  HICCUPS  ? ?Prior to Admission medications   ?Medication Sig Start Date End Date Taking? Authorizing Provider  ?acetaminophen (TYLENOL) 325 MG tablet Take 650 mg by mouth every 6 (six) hours as needed for mild pain.   Yes [provider]  ?albuterol (VENTOLIN HFA) 108 (90 Base)  MCG/ACT inhaler Inhale 1-2 puffs into the lungs every 4 (four) hours as needed for wheezing or shortness of breath. 07/27/21  Yes Barrett Henle, MD  ?aspirin EC 81 MG tablet Take 1 tablet (81 mg total) by mouth daily. 09/02/17  Yes Dunn, Dayna N, PA-C  ?atorvastatin (LIPITOR) 80 MG tablet TAKE 1 TABLET BY MOUTH  DAILY AT 6 PM 07/09/21  Yes [name], MD  ?chlorthalidone (HYGROTON) 25 MG tablet Take 12.5 mg by mouth every morning.  09/10/19  Yes [provider]  ?colchicine 0.6 MG tablet TAKE 2 TABS ON ONSET OF GOUT FLARE THEN REPEAT IN 1 HOUR OF ACUTE GOUT ATTACK 07/06/19  Yes [name], MD  ?glucose blood test strip Use as instructed 01/21/20  Yes [name], MD  ?guaiFENesin ([place]) 600 MG 12  hr tablet Take 600 mg by mouth 2 (two) times daily as needed (congestion, cough).    Yes [provider]  ?hydrALAZINE (APRESOLINE) 50 MG tablet TAKE 1 TABLET BY MOUTH IN  THE MORNING THEN TAKE 2  TABLETS BY MOUTH IN THE  EVENING DAILY. 01/06/21  Yes Jerline Pain, MD  ?irbesartan (AVAPRO) 300 MG tablet Take 300 mg by mouth daily.   Yes [provider]  ?isosorbide mononitrate (IMDUR) 60 MG 24 hr tablet TAKE 1 TABLET BY MOUTH AT  BEDTIME 11/21/20  Yes Allred, Jeneen Rinks, MD  ?metFORMIN (GLUCOPHAGE) 500 MG tablet Take 1 tablet (500 mg total) by mouth 2 (two) times daily with a meal. 07/09/21  Yes [name], MD  ?Lancets [facility] ULTRASOFT) lancets 1 each by Other route 1 day or 1 dose for 1 dose. Use as instructed 01/30/20 01/01/21  [name], MD  ? ?Social History  ? ?Socioeconomic History  ? Marital status: Married  ?  Spouse name: [name]  ? Number of children: 3  ? Years of education: 68  ? Highest education level: Not on file  ?Occupational History  ? Occupation: Musician  ?Tobacco Use  ? Smoking status: Former  ?  Packs/day: 2.00  ?  Years: 15.00  ?  Pack years: 30.00  ?  Types: Cigarettes  ?  Quit date: 07/26/1998  ?  Years since quitting: 23.2  ? Smokeless tobacco:  Never  ?Vaping Use  ? Vaping Use: Never used  ?Substance and Sexual Activity  ? Alcohol use: Not Currently  ?  Alcohol/week: 0.0 standard drinks  ? Drug use: No  ? Sexual activity: Yes  ?Other Topics Concern  ?

## 2021-10-25 ENCOUNTER — Encounter: Payer: Self-pay | Admitting: Family Medicine

## 2021-11-30 ENCOUNTER — Other Ambulatory Visit: Payer: Self-pay | Admitting: Internal Medicine

## 2021-12-03 ENCOUNTER — Ambulatory Visit: Payer: Medicare Other

## 2021-12-07 ENCOUNTER — Other Ambulatory Visit: Payer: Self-pay | Admitting: Family Medicine

## 2021-12-07 DIAGNOSIS — E1122 Type 2 diabetes mellitus with diabetic chronic kidney disease: Secondary | ICD-10-CM

## 2022-01-04 ENCOUNTER — Other Ambulatory Visit: Payer: Self-pay | Admitting: Family Medicine

## 2022-01-04 DIAGNOSIS — E1122 Type 2 diabetes mellitus with diabetic chronic kidney disease: Secondary | ICD-10-CM

## 2022-01-05 LAB — HM DIABETES EYE EXAM

## 2022-01-07 ENCOUNTER — Other Ambulatory Visit: Payer: Self-pay | Admitting: Family Medicine

## 2022-01-07 DIAGNOSIS — E1122 Type 2 diabetes mellitus with diabetic chronic kidney disease: Secondary | ICD-10-CM

## 2022-01-09 ENCOUNTER — Other Ambulatory Visit: Payer: Self-pay | Admitting: Cardiology

## 2022-01-09 DIAGNOSIS — I10 Essential (primary) hypertension: Secondary | ICD-10-CM

## 2022-01-09 DIAGNOSIS — E785 Hyperlipidemia, unspecified: Secondary | ICD-10-CM

## 2022-01-09 DIAGNOSIS — Z951 Presence of aortocoronary bypass graft: Secondary | ICD-10-CM

## 2022-01-09 DIAGNOSIS — I5032 Chronic diastolic (congestive) heart failure: Secondary | ICD-10-CM

## 2022-01-17 ENCOUNTER — Other Ambulatory Visit: Payer: Self-pay | Admitting: Internal Medicine

## 2022-01-17 ENCOUNTER — Other Ambulatory Visit: Payer: Self-pay | Admitting: Family Medicine

## 2022-01-17 DIAGNOSIS — E1122 Type 2 diabetes mellitus with diabetic chronic kidney disease: Secondary | ICD-10-CM

## 2022-02-01 ENCOUNTER — Ambulatory Visit (INDEPENDENT_AMBULATORY_CARE_PROVIDER_SITE_OTHER): Payer: Medicare Other | Admitting: Family Medicine

## 2022-02-01 VITALS — BP 124/70 | HR 86 | Temp 98.0°F | Resp 16 | Ht 63.0 in | Wt 179.0 lb

## 2022-02-01 DIAGNOSIS — E1122 Type 2 diabetes mellitus with diabetic chronic kidney disease: Secondary | ICD-10-CM | POA: Diagnosis not present

## 2022-02-01 LAB — POCT GLYCOSYLATED HEMOGLOBIN (HGB A1C): Hemoglobin A1C: 6.6 % — AB (ref 4.0–5.6)

## 2022-02-01 LAB — GLUCOSE, POCT (MANUAL RESULT ENTRY): POC Glucose: 110 mg/dl — AB (ref 70–99)

## 2022-02-01 NOTE — Patient Instructions (Addendum)
Diabetes control looks great!  No med changes today. Take care and have a great trip out [place].

## 2022-02-01 NOTE — Progress Notes (Signed)
Subjective:  Patient ID: Antonio Wells, male    DOB: Nov 10, 1945  Age: 76 y.o. MRN: [id number]  CC:  Chief Complaint  Patient presents with   Diabetes    HPI ELDEN BRUCATO presents for   Diabetes: Complicated by CKD, CAD.  Nephrology Dr. [name], cardiology Dr. [name]. Uncontrolled at his March visit.  Unfortunately intolerant to higher doses of metformin, and with history of phimosis an balanitis, concern of possible side effects of SGLT2's. Januvia 50 mg started, continued metformin 500 mg twice daily in March. He is on statin, ARB No new side effects on new meds.  Home readings fasting: low 100's, no sx lows.  No postprandial readings.  Travel with the band. Going to [place] for wedding in August.  Microalbumin: 8.4, 10/21/2021.  He is on ARB Up-to-date on ophthalmology exam. Foot exam, Pneumovax: - declines pneumovax.  Diabetic Foot Exam - Simple   Simple Foot Form Visual Inspection No deformities, no ulcerations, no other skin breakdown bilaterally: Yes Sensation Testing Intact to touch and monofilament testing bilaterally: Yes Pulse Check Posterior Tibialis and Dorsalis pulse intact bilaterally: Yes Comments     Lab Results  Component Value Date   HGBA1C 6.6 (A) 02/01/2022   HGBA1C 7.6 (A) 10/21/2021   HGBA1C 7.4 (H) 07/09/2021   Lab Results  Component Value Date   MICROALBUR 8.4 (H) 10/21/2021   LDLCALC 55 07/09/2021   CREATININE 1.45 (H) 08/02/2021     History Patient Active Problem List   Diagnosis Date Noted   Syncope 02/15/2020   Bradycardia 02/15/2020   Hypertensive encephalopathy 10/19/2018   Chronic kidney disease, stage 3 10/19/2018   DM (diabetes mellitus), type 2 with renal complications ([place]) 84/13/2440   Cough 03/17/2018   Lower resp. tract infection 03/17/2018   Sleep apnea with use of continuous positive airway pressure (CPAP) 01/04/2018   Malnutrition of moderate degree 07/14/2017   Thrush 07/11/2017   FTT (failure to thrive)  in adult 07/11/2017   S/P CABG x 3 06/24/2017   CAD (coronary artery disease) 05/25/2017   Unstable angina ([place])    Hypertensive heart disease 07/14/2015   Lower extremity edema 07/14/2015   Aortic stenosis 01/09/2015   OSA on CPAP 12/12/2014   Hypersomnia with sleep apnea 12/12/2014   Obstructive sleep apnea (adult) (pediatric) 02/04/2014   Hypersomnia, persistent 02/04/2014   Obesity 02/04/2014   Mitral regurgitation and aortic stenosis 01/02/2014   DOE (dyspnea on exertion) 11/26/2013   Gout 08/12/2011   Hypertension 08/12/2011   High cholesterol 08/12/2011   OSA (obstructive sleep apnea) 08/12/2011   Diabetes type 2, uncontrolled 08/12/2011   Past Medical History:  Diagnosis Date   Arthritis    "knees" (07/11/2017)   CKD (chronic kidney disease), stage III ([place])    lov dr Thurmond Butts [place] [id number] on chart   Complication of anesthesia    post op afibe after cabg x 2 2018   Coronary artery disease    a.  CABGx3 in 05/2017.   Gout    Heart murmur    mild, saw dr Lovena Le 03-26-2020 f/u prn   High cholesterol    Hypertension    Mild aortic stenosis    Mild mitral regurgitation    OSA on CPAP    last used cpap 3 weeks ago   Phimosis    Postoperative atrial fibrillation ([place]) 06/2017   Sleep apnea    uses CPAP   Type II diabetes mellitus ([place])    Past Surgical History:  Procedure  Laterality Date   CARDIAC CATHETERIZATION     CIRCUMCISION N/A 06/27/2020   Procedure: CIRCUMCISION ADULT;  Surgeon: Ceasar Mons, MD;  Location[facility];  Service: Urology;  Laterality: N/A;   COLONOSCOPY  2008   CORONARY ARTERY BYPASS GRAFT N/A 06/24/2017   Procedure: CORONARY ARTERY BYPASS GRAFTING (CABG) x 3 ; Using Left Internal Mammary Artery and Right Great Saphenous Leg Vein Harvested Endoscopically;  Surgeon: Gaye Pollack, MD;  Location: [place] [facility];  Service: Open Heart Surgery;  Laterality: N/A;   LEFT HEART CATH AND CORONARY ANGIOGRAPHY N/A 05/25/2017    Procedure: LEFT HEART CATH AND CORONARY ANGIOGRAPHY;  Surgeon: Wellington Hampshire, MD;  Location: [facility];  Service: Cardiovascular;  Laterality: N/A;   TEE WITHOUT CARDIOVERSION N/A 06/24/2017   Procedure: TRANSESOPHAGEAL ECHOCARDIOGRAM (TEE);  Surgeon: Gaye Pollack, MD;  Location: [facility];  Service: Open Heart Surgery;  Laterality: N/A;   Allergies  Allergen Reactions   Penicillins Other (See Comments)    Has patient had a PCN reaction causing immediate rash, facial/tongue/throat swelling, SOB or lightheadedness with hypotension: No Has patient had a PCN reaction causing severe rash involving mucus membranes or skin necrosis: No Has patient had a PCN reaction that required hospitalization: No Has patient had a PCN reaction occurring within the last 10 years: No If all of the above answers are "NO", then may proceed with Cephalosporin use.   Passed out ? SYNCOPE ?   Lisinopril Cough   Amiodarone Nausea And Vomiting   Amlodipine Other (See Comments)    Causes dizzines   Beta Adrenergic Blockers     Do not take beta blockers causes heart block with coreg   Oxycodone Other (See Comments)    hallucinations   Zofran [Ondansetron Hcl] Other (See Comments)    Has an interaction with another medication pt takes    Cortisone Nausea And Vomiting and Other (See Comments)    HICCUPS   Tramadol Nausea And Vomiting and Other (See Comments)    HICCUPS   Prior to Admission medications   Medication Sig Start Date End Date Taking? Authorizing Provider  acetaminophen (TYLENOL) 325 MG tablet Take 650 mg by mouth every 6 (six) hours as needed for mild pain.    [provider]  albuterol (VENTOLIN HFA) 108 (90 Base) MCG/ACT inhaler Inhale 1-2 puffs into the lungs every 4 (four) hours as needed for wheezing or shortness of breath. 07/27/21   Barrett Henle, MD  aspirin EC 81 MG tablet Take 1 tablet (81 mg total) by mouth daily. 09/02/17   Dunn, Nedra Hai, PA-C  atorvastatin (LIPITOR)  80 MG tablet TAKE 1 TABLET BY MOUTH  DAILY AT 6 PM 07/09/21   [name], MD  chlorthalidone (HYGROTON) 25 MG tablet Take 12.5 mg by mouth every morning.  09/10/19   [provider]  colchicine 0.6 MG tablet TAKE 2 TABS ON ONSET OF GOUT FLARE THEN REPEAT IN 1 HOUR OF ACUTE GOUT ATTACK 07/06/19   [name], MD  glucose blood test strip Use as instructed 01/21/20   [name], MD  guaiFENesin (MUCINEX) 600 MG 12 hr tablet Take 600 mg by mouth 2 (two) times daily as needed (congestion, cough).     [provider]  hydrALAZINE (APRESOLINE) 50 MG tablet TAKE 1 TABLET BY MOUTH IN  THE MORNING THEN TAKE 2  TABLETS BY MOUTH IN THE  EVENING. Please schedule yearly  Appointment for future refills. Thank  you 01/11/22   Jerline Pain, MD  irbesartan (AVAPRO) 300 MG tablet Take 300 mg by mouth daily.    [provider]  isosorbide mononitrate (IMDUR) 60 MG 24 hr tablet TAKE 1 TABLET BY MOUTH AT  BEDTIME 11/30/21   Allred, Jeneen Rinks, MD  JANUVIA 50 MG tablet TAKE 1 TABLET BY MOUTH DAILY 01/18/22   [name], MD  Lancets [facility] ULTRASOFT) lancets 1 each by Other route 1 day or 1 dose for 1 dose. Use as instructed 01/30/20 01/01/21  [name], MD  metFORMIN (GLUCOPHAGE) 500 MG tablet TAKE 1 TABLET BY MOUTH TWICE  DAILY WITH A MEAL 12/07/21   [name], MD   Social History   Socioeconomic History   Marital status: Married    Spouse name: [name]   Number of children: 3   Years of education: 16   Highest education level: Not on file  Occupational History   Occupation: Musician  Tobacco Use   Smoking status: Former    Packs/day: 2.00    Years: 15.00    Total pack years: 30.00    Types: Cigarettes    Quit date: 07/26/1998    Years since quitting: 23.5   Smokeless tobacco: Never  Vaping Use   Vaping Use: Never used  Substance and Sexual Activity   Alcohol use: Not Currently    Alcohol/week: 0.0 standard drinks of alcohol   Drug use: No    Sexual activity: Yes  Other Topics Concern   Not on file  Social History Narrative         Social Determinants of Health   Financial Resource Strain: Not on file  Food Insecurity: Not on file  Transportation Needs: Not on file  Physical Activity: Not on file  Stress: Not on file  Social Connections: Not on file  Intimate Partner Violence: Not on file    Review of Systems  Constitutional:  Negative for fatigue and unexpected weight change.  Eyes:  Negative for visual disturbance.  Respiratory:  Negative for cough, chest tightness and shortness of breath (chronic, no recent changes.).   Cardiovascular:  Negative for chest pain, palpitations and leg swelling.  Gastrointestinal:  Negative for abdominal pain and blood in stool.  Neurological:  Negative for dizziness, light-headedness and headaches.     Objective:   Vitals:   02/01/22 0958  BP: 124/70  Pulse: 86  Resp: 16  Temp: 98 F (36.7 C)  TempSrc: Temporal  SpO2: 96%  Weight: 179 lb (81.2 kg)  Height: '5\' 3"'$  (1.6 m)     Physical Exam Vitals reviewed.  Constitutional:      Appearance: He is well-developed.  HENT:     Head: Normocephalic and atraumatic.  Neck:     Vascular: No carotid bruit or JVD.  Cardiovascular:     Rate and Rhythm: Normal rate and regular rhythm.     Heart sounds: Murmur (6-2/7 systolic.) heard.  Pulmonary:     Effort: Pulmonary effort is normal.     Breath sounds: Normal breath sounds. No rales.  Musculoskeletal:     Right lower leg: No edema.     Left lower leg: No edema.  Skin:    General: Skin is warm and dry.  Neurological:     Mental Status: He is alert and oriented to person, place, and time.  Psychiatric:        Mood and Affect: Mood normal.      Results for orders placed or performed in  visit on 02/01/22  POCT glucose (manual entry)  Result Value Ref Range   POC Glucose 110 (A) 70 - 99 mg/dl  POCT glycosylated hemoglobin (Hb A1C)  Result Value Ref Range    Hemoglobin A1C 6.6 (A) 4.0 - 5.6 %   HbA1c POC (<> result, manual entry)     HbA1c, POC (prediabetic range)     HbA1c, POC (controlled diabetic range)       Assessment & Plan:  TAMMY ERICSSON is a 76 y.o. male . Type 2 diabetes mellitus with chronic kidney disease, without long-term current use of insulin, unspecified CKD stage ([place]) - Plan: POCT glucose (manual entry), POCT glycosylated hemoglobin (Hb A1C)  -Improved control with Januvia, tolerating current regimen.  Continue same dose along with metformin.  Recheck 3 months.  No orders of the defined types were placed in this encounter.  Patient Instructions  Diabetes control looks great!  No med changes today. Take care and have a great trip out [place],   [name], MD [place], [facility] 02/01/22 10:22 AM

## 2022-02-09 ENCOUNTER — Other Ambulatory Visit: Payer: Self-pay | Admitting: Cardiology

## 2022-02-09 DIAGNOSIS — E785 Hyperlipidemia, unspecified: Secondary | ICD-10-CM

## 2022-02-09 DIAGNOSIS — I5032 Chronic diastolic (congestive) heart failure: Secondary | ICD-10-CM

## 2022-02-09 DIAGNOSIS — Z951 Presence of aortocoronary bypass graft: Secondary | ICD-10-CM

## 2022-02-09 DIAGNOSIS — I10 Essential (primary) hypertension: Secondary | ICD-10-CM

## 2022-02-18 ENCOUNTER — Ambulatory Visit (INDEPENDENT_AMBULATORY_CARE_PROVIDER_SITE_OTHER): Payer: Medicare Other | Admitting: *Deleted

## 2022-02-18 DIAGNOSIS — Z0000 Encounter for general adult medical examination without abnormal findings: Secondary | ICD-10-CM | POA: Diagnosis not present

## 2022-02-18 NOTE — Progress Notes (Signed)
Subjective:   Antonio Wells is a 76 y.o. male who presents for Medicare Annual/Subsequent preventive examination.  I connected with  [name] on 02/18/22 by a telephone enabled telemedicine application and verified that I am speaking with the correct person using two identifiers.   I discussed the limitations of evaluation and management by telemedicine. The patient expressed understanding and agreed to proceed.  Patient location: home  Provider location: Tele-Health-home    Review of Systems     Cardiac Risk Factors include: advanced age (>59mn, >>41women);diabetes mellitus;male gender;hypertension;obesity (BMI >30kg/m2);sedentary lifestyle     Objective:    Today's Vitals   There is no height or weight on file to calculate BMI.     06/27/2020   11:03 AM 08/27/2019   11:08 AM 10/20/2018    6:00 AM 10/19/2018    2:45 PM 07/11/2017    9:07 PM 07/01/2017   12:21 PM 06/24/2017    9:00 PM  Advanced Directives  Does Patient Have a Medical Advance Directive? No No No No No No No  Would patient like information on creating a medical advance directive? No - Patient declined Yes (Inpatient - patient defers creating a medical advance directive at this time - Information given) No - Patient declined  No - Patient declined  No - Patient declined    Current Medications (verified) Outpatient Encounter Medications as of 02/18/2022  Medication Sig   acetaminophen (TYLENOL) 325 MG tablet Take 650 mg by mouth every 6 (six) hours as needed for mild pain.   albuterol (VENTOLIN HFA) 108 (90 Base) MCG/ACT inhaler Inhale 1-2 puffs into the lungs every 4 (four) hours as needed for wheezing or shortness of breath.   aspirin EC 81 MG tablet Take 1 tablet (81 mg total) by mouth daily.   atorvastatin (LIPITOR) 80 MG tablet TAKE 1 TABLET BY MOUTH  DAILY AT 6 PM   chlorthalidone (HYGROTON) 25 MG tablet Take 12.5 mg by mouth every morning.    colchicine 0.6 MG tablet TAKE 2 TABS ON ONSET OF  GOUT FLARE THEN REPEAT IN 1 HOUR OF ACUTE GOUT ATTACK   glucose blood test strip Use as instructed   guaiFENesin (MUCINEX) 600 MG 12 hr tablet Take 600 mg by mouth 2 (two) times daily as needed (congestion, cough).    hydrALAZINE (APRESOLINE) 50 MG tablet TAKE 1 TABLET BY MOUTH IN THE  MORNING AND 2 TABLETS BY MOUTH  IN THE EVENING--KEEP UPCOMING OVERDUE OFFICE VISIT TO RECEIVE FURTHER REFILLS AT APPOINTMENT.   irbesartan (AVAPRO) 300 MG tablet Take 300 mg by mouth daily.   isosorbide mononitrate (IMDUR) 60 MG 24 hr tablet TAKE 1 TABLET BY MOUTH AT  BEDTIME   JANUVIA 50 MG tablet TAKE 1 TABLET BY MOUTH DAILY   metFORMIN (GLUCOPHAGE) 500 MG tablet TAKE 1 TABLET BY MOUTH TWICE  DAILY WITH A MEAL   Lancets (ONETOUCH ULTRASOFT) lancets 1 each by Other route 1 day or 1 dose for 1 dose. Use as instructed   No facility-administered encounter medications on file as of 02/18/2022.    Allergies (verified) Penicillins, Lisinopril, Amiodarone, Amlodipine, Beta adrenergic blockers, Oxycodone, Zofran [ondansetron hcl], Cortisone, and Tramadol   History: Past Medical History:  Diagnosis Date   Arthritis    "knees" (07/11/2017)   CKD (chronic kidney disease), [place] III [place]    [place] [id number] chart   Complication of anesthesia    post op afibe after cabg x 2 2018   Coronary  artery disease    a.  CABGx3 in 05/2017.   Gout    Heart murmur    mild, saw dr Lovena Le 03-26-2020 f/u prn   High cholesterol    Hypertension    Mild aortic stenosis    Mild mitral regurgitation    OSA on CPAP    last used cpap 3 weeks ago   Phimosis    Postoperative atrial fibrillation ([place]) 06/2017   Sleep apnea    uses CPAP   Type II diabetes mellitus [facility])    Past Surgical History:  Procedure Laterality Date   CARDIAC CATHETERIZATION     CIRCUMCISION N/A 06/27/2020   Procedure: CIRCUMCISION ADULT;  Surgeon: Ceasar Mons, MD;  Location: [facility];  Service: Urology;   Laterality: N/A;   COLONOSCOPY  2008   CORONARY ARTERY BYPASS GRAFT N/A 06/24/2017   Procedure: CORONARY ARTERY BYPASS GRAFTING (CABG) x 3 ; Using Left Internal Mammary Artery and Right Great Saphenous Leg Vein Harvested Endoscopically;  Surgeon: Gaye Pollack, MD;  Location: [facility];  Service: Open Heart Surgery;  Laterality: N/A;   LEFT HEART CATH AND CORONARY ANGIOGRAPHY N/A 05/25/2017   Procedure: LEFT HEART CATH AND CORONARY ANGIOGRAPHY;  Surgeon: Wellington Hampshire, MD;  Location: [facility];  Service: Cardiovascular;  Laterality: N/A;   TEE WITHOUT CARDIOVERSION N/A 06/24/2017   Procedure: TRANSESOPHAGEAL ECHOCARDIOGRAM (TEE);  Surgeon: Gaye Pollack, MD;  Location: [facility];  Service: Open Heart Surgery;  Laterality: N/A;   Family History  Problem Relation Age of Onset   Hypertension Mother    Kidney disease Mother    Diabetes Mother    Hypertension Father    Cancer Brother        lung   Colon cancer Brother 33   Stroke Brother    Esophageal cancer Neg Hx    Rectal cancer Neg Hx    Stomach cancer Neg Hx    Colon polyps Neg Hx    Social History   Socioeconomic History   Marital status: Married    Spouse name: [name]   Number of children: 3   Years of education: 16   Highest education level: Not on file  Occupational History   Occupation: Musician  Tobacco Use   Smoking status: Former    Packs/day: 2.00    Years: 15.00    Total pack years: 30.00    Types: Cigarettes    Quit date: 07/26/1998    Years since quitting: 23.5   Smokeless tobacco: Never  Vaping Use   Vaping Use: Never used  Substance and Sexual Activity   Alcohol use: Not Currently    Alcohol/week: 0.0 standard drinks of alcohol   Drug use: No   Sexual activity: Yes  Other Topics Concern   Not on file  Social History Narrative         Social Determinants of Health   Financial Resource Strain: Low Risk  (02/18/2022)   Overall Financial Resource Strain (CARDIA)    Difficulty of Paying Living  Expenses: Not hard at all  Food Insecurity: No Food Insecurity (02/18/2022)   Hunger Vital Sign    Worried About Running Out of Food in the Last Year: Never true    Ran Out of Food in the Last Year: Never true  Transportation Needs: No Transportation Needs (02/18/2022)   PRAPARE - Hydrologist (Medical): No    Lack of Transportation (Non-Medical): No  Physical Activity: Inactive (02/18/2022)  Exercise Vital Sign    Days of Exercise per Week: 0 days    Minutes of Exercise per Session: 0 min  Stress: No Stress Concern Present (02/18/2022)   [place]    Feeling of Stress : Not at all  Social Connections: Moderately Integrated (02/18/2022)   Social Connection and Isolation Panel [NHANES]    Frequency of Communication with Friends and Family: Three times a week    Frequency of Social Gatherings with Friends and Family: Twice a week    Attends Religious Services: Never    Marine scientist or Organizations: Yes    Attends Music therapist: 1 to 4 times per year    Marital Status: Married    Tobacco Counseling Counseling given: Not Answered   Clinical Intake:  Pre-visit preparation completed: Yes  Pain : No/denies pain     Diabetes: Yes CBG done?: No Did pt. bring in CBG monitor from home?: No  How often do you need to have someone help you when you read instructions, pamphlets, or other written materials from your doctor or pharmacy?: 1 - Never  Diabetic?  Yes   Nutrition Risk Assessment:  Has the patient had any N/V/D within the last 2 months?  No  Does the patient have any non-healing wounds?  No  Has the patient had any unintentional weight loss or weight gain?  No   Diabetes:  Is the patient diabetic?  Yes  If diabetic, was a CBG obtained today?  No  Did the patient bring in their glucometer from home?  No  How often do you monitor your CBG's? 1 x a day.    Financial Strains and Diabetes Management:  Are you having any financial strains with the device, your supplies or your medication? No .  Does the patient want to be seen by Chronic Care Management for management of their diabetes?  No  Would the patient like to be referred to a Nutritionist or for Diabetic Management?  No   Diabetic Exams:  Diabetic Eye Exam Pt has been advised about the importance in completing this exam. Diabetic Foot Exam:  Pt has been advised about the importance in completing this exam.    Interpreter Needed?: No  Information entered by :: Leroy Kennedy LPN   Activities of Daily Living    02/18/2022   11:24 AM 02/15/2022   10:56 AM  In your present state of health, do you have any difficulty performing the following activities:  Hearing? 0 0  Vision? 0 0  Difficulty concentrating or making decisions? 0 0  Walking or climbing stairs? 0 1  Dressing or bathing? 0 0  Doing errands, shopping? 0 0  Preparing Food and eating ? N N  Using the Toilet? N N  In the past six months, have you accidently leaked urine? N N  Do you have problems with loss of bowel control? N N  Managing your Medications? N N  Managing your Finances? N N  Housekeeping or managing your Housekeeping? N N    Patient Care Team: [name], MD as PCP - General (Family Medicine) [name], MD as PCP - Cardiology (Cardiology) [name], MD as Consulting Physician (Cardiology)  Indicate any recent Medical Services you may have received from other than [facility] providers in the past year (date may be approximate).     Assessment:   This is a routine wellness examination for [place].  Hearing/Vision screen Hearing Screening - Comments:: No trouble hearing Vision Screening - Comments:: My eye doctor Up to date  Dietary issues and exercise activities discussed: Current Exercise Habits: The patient does not participate in regular exercise at present, Exercise limited  by: None identified   Goals Addressed             This Visit's Progress    Patient Stated       Would like to play more music      Depression Screen    02/18/2022   11:20 AM 02/01/2022   10:00 AM 10/21/2021   10:09 AM 08/10/2021    9:07 AM 07/09/2021   10:24 AM 04/06/2021    9:59 AM 12/29/2020    1:19 PM  PHQ 2/9 Scores  PHQ - 2 Score 0 0 0 0 0 0 0  PHQ- 9 Score       0    Fall Risk    02/15/2022   10:56 AM 02/01/2022   10:00 AM 10/21/2021   10:09 AM 08/10/2021    9:07 AM 07/09/2021   10:24 AM  Fall Risk   Falls in the past year? 0 0 0 0 0  Number falls in past yr:  0 0 0 0  Injury with Fall? 0 0 0 0 0  Risk for fall due to :  No Fall Risks No Fall Risks No Fall Risks No Fall Risks  Follow up  Falls evaluation completed Falls evaluation completed Falls evaluation completed Falls evaluation completed    FALL RISK PREVENTION PERTAINING TO THE HOME:  Any stairs in or around the home? Yes  If so, are there any without handrails? No  Home free of loose throw rugs in walkways, pet beds, electrical cords, etc? Yes  Adequate lighting in your home to reduce risk of falls? Yes   ASSISTIVE DEVICES UTILIZED TO PREVENT FALLS:  Life alert? No  Use of a cane, walker or w/c? No  Grab bars in the bathroom? No  Shower chair or bench in shower? No  Elevated toilet seat or a handicapped toilet? No   TIMED UP AND GO:  Was the test performed? No .    Cognitive Function:        02/18/2022   11:17 AM 08/27/2019   11:05 AM 03/29/2017   10:49 AM  6CIT Screen  What Year? 0 points 0 points 0 points  What month? 0 points 0 points 0 points  What time? 0 points 0 points 0 points  Count back from 20 0 points 0 points 0 points  Months in reverse 0 points 0 points 0 points  Repeat phrase 0 points 0 points 0 points  Total Score 0 points 0 points 0 points    Immunizations Immunization History  Administered Date(s) Administered   Influenza, High Dose Seasonal PF 03/20/2019    Influenza, Seasonal, Injecte, Preservative Fre 07/03/2012   Influenza,inj,Quad PF,6+ Mos 08/25/2015, 05/12/2017, 05/29/2018   Influenza-Unspecified 07/24/2016, 04/23/2020, 05/05/2021   PFIZER(Purple Top)SARS-COV-2 Vaccination 08/31/2019, 09/21/2019, 04/02/2020, 12/04/2020, 04/16/2021   Pneumococcal Polysaccharide-23 07/03/2012   Tdap 07/03/2012    TDAP status: Up to date  Flu Vaccine status: Up to date  Pneumococcal vaccine status: Due, Education has been provided regarding the importance of this vaccine. Advised may receive this vaccine at local pharmacy or Health Dept. Aware to provide a copy of the vaccination record if obtained from local pharmacy or Health Dept. Verbalized acceptance and understanding.  Covid-19 vaccine status: Information provided on how  to obtain vaccines.   Qualifies for Shingles Vaccine? No   Zostavax completed No   Shingrix Completed?: No.    Education has been provided regarding the importance of this vaccine. Patient has been advised to call insurance company to determine out of pocket expense if they have not yet received this vaccine. Advised may also receive vaccine at local pharmacy or Health Dept. Verbalized acceptance and understanding.  Screening Tests Health Maintenance  Topic Date Due   COVID-19 Vaccine (6 - Pfizer series) 03/06/2022 (Originally 06/11/2021)   Zoster Vaccines- Shingrix (1 of 2) 05/04/2022 (Originally 01/12/1997)   Pneumonia Vaccine 77+ Years old (2 - PCV) 07/09/2022 (Originally 07/03/2013)   INFLUENZA VACCINE  02/23/2022   OPHTHALMOLOGY EXAM  03/25/2022   TETANUS/TDAP  07/03/2022   HEMOGLOBIN A1C  08/04/2022   FOOT EXAM  02/02/2023   COLONOSCOPY (Pts 45-20yr Insurance coverage will need to be confirmed)  12/17/2023   Hepatitis C Screening  Completed   HPV VACCINES  Aged Out    Health Maintenance  There are no preventive care reminders to display for this patient.   Colorectal cancer screening: Type of screening:  Colonoscopy. Completed 2022. Repeat every 3 years  Lung Cancer Screening: (Low Dose CT Chest recommended if Age 75-80years, 30 pack-year currently smoking OR have quit w/in 15years.) does not qualify.   Lung Cancer Screening Referral:   Additional Screening:  Hepatitis C Screening: does not qualify; Completed 2017  Vision Screening: Recommended annual ophthalmology exams for early detection of glaucoma and other disorders of the eye. Is the patient up to date with their annual eye exam?  Yes  Who is the provider or what is the name of the office in which the patient attends annual eye exams? My eye doctor If pt is not established with a provider, would they like to be referred to a provider to establish care? No .   Dental Screening: Recommended annual dental exams for proper oral hygiene  Community Resource Referral / Chronic Care Management: CRR required this visit?  No   CCM required this visit?  No      Plan:     I have personally reviewed and noted the following in the patient's chart:   Medical and social history Use of alcohol, tobacco or illicit drugs  Current medications and supplements including opioid prescriptions. Patient is not currently taking opioid prescriptions. Functional ability and status Nutritional status Physical activity Advanced directives List of other physicians Hospitalizations, surgeries, and ER visits in previous 12 months Vitals Screenings to include cognitive, depression, and falls Referrals and appointments  In addition, I have reviewed and discussed with patient certain preventive protocols, quality metrics, and best practice recommendations. A written personalized care plan for preventive services as well as general preventive health recommendations were provided to patient.     JLeroy Kennedy LPN   [id number]  Nurse Notes:

## 2022-02-18 NOTE — Patient Instructions (Signed)
[name] , Thank you for taking time to come for your Medicare Wellness Visit. I appreciate your ongoing commitment to your health goals. Please review the following plan we discussed and let me know if I can assist you in the future.   Screening recommendations/referrals: Colonoscopy: up to date Recommended yearly ophthalmology/optometry visit for glaucoma screening and checkup Recommended yearly dental visit for hygiene and checkup  Vaccinations: Influenza vacci  Education provided Pneumococcal vaccine: Education provided Tdap vaccine: Education provided Shingles vaccine: Education provided    Advanced directives: Education provided  Conditions/risks identified:     Preventive Care 75 Years and Older, Male Preventive care refers to lifestyle choices and visits with your health care provider that can promote health and wellness. What does preventive care include? A yearly physical exam. This is also called an annual well check. Dental exams once or twice a year. Routine eye exams. Ask your health care provider how often you should have your eyes checked. Personal lifestyle choices, including: Daily care of your teeth and gums. Regular physical activity. Eating a healthy diet. Avoiding tobacco and drug use. Limiting alcohol use. Practicing safe sex. Taking low doses of aspirin every day. Taking vitamin and mineral supplements as recommended by your health care provider. What happens during an annual well check? The services and screenings done by your health care provider during your annual well check will depend on your age, overall health, lifestyle risk factors, and family history of disease. Counseling  Your health care provider may ask you questions about your: Alcohol use. Tobacco use. Drug use. Emotional well-being. Home and relationship well-being. Sexual activity. Eating habits. History of falls. Memory and ability to understand (cognition). Work and work  Statistician. Screening  You may have the following tests or measurements: Height, weight, and BMI. Blood pressure. Lipid and cholesterol levels. These may be checked every 5 years, or more frequently if you are over 75 years old. Skin check. Lung cancer screening. You may have this screening every year starting at age 75 if you have a 30-pack-year history of smoking and currently smoke or have quit within the past 15 years. Fecal occult blood test (FOBT) of the stool. You may have this test every year starting at age 40. Flexible sigmoidoscopy or colonoscopy. You may have a sigmoidoscopy every 5 years or a colonoscopy every 10 years starting at age 54. Prostate cancer screening. Recommendations will vary depending on your family history and other risks. Hepatitis C blood test. Hepatitis B blood test. Sexually transmitted disease (STD) testing. Diabetes screening. This is done by checking your blood sugar (glucose) after you have not eaten for a while (fasting). You may have this done every 1-3 years. Abdominal aortic aneurysm (AAA) screening. You may need this if you are a current or former smoker. Osteoporosis. You may be screened starting at age 15 if you are at high risk. Talk with your health care provider about your test results, treatment options, and if necessary, the need for more tests. Vaccines  Your health care provider may recommend certain vaccines, such as: Influenza vaccine. This is recommended every year. Tetanus, diphtheria, and acellular pertussis (Tdap, Td) vaccine. You may need a Td booster every 10 years. Zoster vaccine. You may need this after age 90. Pneumococcal 13-valent conjugate (PCV13) vaccine. One dose is recommended after age 67. Pneumococcal polysaccharide (PPSV23) vaccine. One dose is recommended after age 35. Talk to your health care provider about which screenings and vaccines you need and how often you need  them. This information is not intended to replace  advice given to you by your health care provider. Make sure you discuss any questions you have with your health care provider. Document Released: 08/08/2015 Document Revised: 03/31/2016 Document Reviewed: 05/13/2015 Elsevier Interactive Patient Education  2017 [place] Prevention in the Home Falls can cause injuries. They can happen to people of all ages. There are many things you can do to make your home safe and to help prevent falls. What can I do on the outside of my home? Regularly fix the edges of walkways and driveways and fix any cracks. Remove anything that might make you trip as you walk through a door, such as a raised step or threshold. Trim any bushes or trees on the path to your home. Use bright outdoor lighting. Clear any walking paths of anything that might make someone trip, such as rocks or tools. Regularly check to see if handrails are loose or broken. Make sure that both sides of any steps have handrails. Any raised decks and porches should have guardrails on the edges. Have any leaves, snow, or ice cleared regularly. Use sand or salt on walking paths during winter. Clean up any spills in your garage right away. This includes oil or grease spills. What can I do in the bathroom? Use night lights. Install grab bars by the toilet and in the tub and shower. Do not use towel bars as grab bars. Use non-skid mats or decals in the tub or shower. If you need to sit down in the shower, use a plastic, non-slip stool. Keep the floor dry. Clean up any water that spills on the floor as soon as it happens. Remove soap buildup in the tub or shower regularly. Attach bath mats securely with double-sided non-slip rug tape. Do not have throw rugs and other things on the floor that can make you trip. What can I do in the bedroom? Use night lights. Make sure that you have a light by your bed that is easy to reach. Do not use any sheets or blankets that are too big for your bed.  They should not hang down onto the floor. Have a firm chair that has side arms. You can use this for support while you get dressed. Do not have throw rugs and other things on the floor that can make you trip. What can I do in the kitchen? Clean up any spills right away. Avoid walking on wet floors. Keep items that you use a lot in easy-to-reach places. If you need to reach something above you, use a strong step stool that has a grab bar. Keep electrical cords out of the way. Do not use floor polish or wax that makes floors slippery. If you must use wax, use non-skid floor wax. Do not have throw rugs and other things on the floor that can make you trip. What can I do with my stairs? Do not leave any items on the stairs. Make sure that there are handrails on both sides of the stairs and use them. Fix handrails that are broken or loose. Make sure that handrails are as long as the stairways. Check any carpeting to make sure that it is firmly attached to the stairs. Fix any carpet that is loose or worn. Avoid having throw rugs at the top or bottom of the stairs. If you do have throw rugs, attach them to the floor with carpet tape. Make sure that you have a light switch at  the top of the stairs and the bottom of the stairs. If you do not have them, ask someone to add them for you. What else can I do to help prevent falls? Wear shoes that: Do not have high heels. Have rubber bottoms. Are comfortable and fit you well. Are closed at the toe. Do not wear sandals. If you use a stepladder: Make sure that it is fully opened. Do not climb a closed stepladder. Make sure that both sides of the stepladder are locked into place. Ask someone to hold it for you, if possible. Clearly mark and make sure that you can see: Any grab bars or handrails. First and last steps. Where the edge of each step is. Use tools that help you move around (mobility aids) if they are needed. These  include: Canes. Walkers. Scooters. Crutches. Turn on the lights when you go into a dark area. Replace any light bulbs as soon as they burn out. Set up your furniture so you have a clear path. Avoid moving your furniture around. If any of your floors are uneven, fix them. If there are any pets around you, be aware of where they are. Review your medicines with your doctor. Some medicines can make you feel dizzy. This can increase your chance of falling. Ask your doctor what other things that you can do to help prevent falls. This information is not intended to replace advice given to you by your health care provider. Make sure you discuss any questions you have with your health care provider. Document Released: 05/08/2009 Document Revised: 12/18/2015 Document Reviewed: 08/16/2014 Elsevier Interactive Patient Education  2017 [employer].

## 2022-02-28 ENCOUNTER — Other Ambulatory Visit: Payer: Self-pay | Admitting: Family Medicine

## 2022-02-28 ENCOUNTER — Other Ambulatory Visit: Payer: Self-pay | Admitting: Internal Medicine

## 2022-02-28 DIAGNOSIS — I2510 Atherosclerotic heart disease of native coronary artery without angina pectoris: Secondary | ICD-10-CM

## 2022-04-10 ENCOUNTER — Other Ambulatory Visit: Payer: Self-pay | Admitting: Cardiology

## 2022-04-10 DIAGNOSIS — E785 Hyperlipidemia, unspecified: Secondary | ICD-10-CM

## 2022-04-10 DIAGNOSIS — I10 Essential (primary) hypertension: Secondary | ICD-10-CM

## 2022-04-10 DIAGNOSIS — I5032 Chronic diastolic (congestive) heart failure: Secondary | ICD-10-CM

## 2022-04-10 DIAGNOSIS — Z951 Presence of aortocoronary bypass graft: Secondary | ICD-10-CM

## 2022-04-13 ENCOUNTER — Encounter: Payer: Self-pay | Admitting: Cardiology

## 2022-04-13 ENCOUNTER — Ambulatory Visit: Payer: Medicare Other | Attending: Cardiology | Admitting: Cardiology

## 2022-04-13 VITALS — BP 124/70 | HR 80 | Ht 63.0 in | Wt 182.0 lb

## 2022-04-13 DIAGNOSIS — I350 Nonrheumatic aortic (valve) stenosis: Secondary | ICD-10-CM | POA: Diagnosis not present

## 2022-04-13 DIAGNOSIS — I119 Hypertensive heart disease without heart failure: Secondary | ICD-10-CM | POA: Diagnosis not present

## 2022-04-13 DIAGNOSIS — I2510 Atherosclerotic heart disease of native coronary artery without angina pectoris: Secondary | ICD-10-CM

## 2022-04-13 NOTE — Patient Instructions (Signed)
Medication Instructions:  The current medical regimen is effective;  continue present plan and medications.  *If you need a refill on your cardiac medications before your next appointment, please call your pharmacy*  Testing/Procedures: Your physician has requested that you have an echocardiogram. Echocardiography is a painless test that uses sound waves to create images of your heart. It provides your doctor with information about the size and shape of your heart and how well your heart's chambers and valves are working. This procedure takes approximately one hour. There are no restrictions for this procedure.  Follow-Up: At [facility], you and your health needs are our priority.  As part of our continuing mission to provide you with exceptional heart care, we have created designated Provider Care Teams.  These Care Teams include your primary Cardiologist (physician) and Advanced Practice Providers (APPs -  Physician Assistants and Nurse Practitioners) who all work together to provide you with the care you need, when you need it.  We recommend signing up for the patient portal called "MyChart".  Sign up information is provided on this After Visit Summary.  MyChart is used to connect with patients for Virtual Visits (Telemedicine).  Patients are able to view lab/test results, encounter notes, upcoming appointments, etc.  Non-urgent messages can be sent to your provider as well.   To learn more about what you can do with MyChart, go to [email or web address].    Your next appointment:   1 year(s)  The format for your next appointment:   In Person  Provider:    Dr Candee Furbish      Important Information About Sugar

## 2022-04-13 NOTE — Progress Notes (Signed)
Cardiology Office Note:    Date:  04/13/2022   ID:  Antonio Wells, DOB 22-Jun-1946, MRN [id number]  PCP:  [name], MD   [facility] Providers Cardiologist:  Candee Furbish, MD     Referring MD: [name], MD   History of Present Illness:    Antonio Wells is a 76 y.o. male is here today to follow-up for aortic stenosis, coronary artery disease, hypertension.  Overall he is doing very well.  Denies any fevers chills nausea vomiting syncope bleeding.  Mild short of breath when riding bike but this is likely deconditioning he states.  He has not experienced any significant tachycardia.  In the past we have discontinued his beta-blocker because of second-degree heart block.  He is doing well.  Previous Visit: 03/27/2020 Mr. Vanbergen is referred by Truitt Merle for evaluation of symptomatic bradycardia. 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 His beta blocker was stopped. He has felt well in the interim. He denies additional syncope and minimal palpitations. He notes that his resting HR has been a bit elevated. He denies chest pain, sob, or edema. He admits to being sedentary.  He has not felt the feeling of dizziness since he stopped taking the beta-blocker. Occassionally he is short of breath when going up a flight of stairs or around the house. 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   He reports he is unable to exercise as much because of pain in his right knee. He may need a knee replacement. He denies having any chest pain, tightness, or pressure.   Past Medical History:  Diagnosis Date   Arthritis    "knees" (07/11/2017)   CKD (chronic kidney disease), stage III ([place])    lov dr Thurmond Butts [place] [phone] on chart   Complication of anesthesia    post op afibe after cabg x 2 2018   Coronary artery disease    a.  CABGx3 in  05/2017.   Gout    Heart murmur    mild, saw dr Lovena Le 03-26-2020 f/u prn   High cholesterol    Hypertension    Mild aortic stenosis    Mild mitral regurgitation    OSA on CPAP    last used cpap 3 weeks ago   Phimosis    Postoperative atrial fibrillation ([place]) 06/2017   Sleep apnea    uses CPAP   Type II diabetes mellitus [facility])     Past Surgical History:  Procedure Laterality Date   CARDIAC CATHETERIZATION     CIRCUMCISION N/A 06/27/2020   Procedure: CIRCUMCISION ADULT;  Surgeon: Ceasar Mons, MD;  Location: [facility];  Service: Urology;  Laterality: N/A;   COLONOSCOPY  2008   CORONARY ARTERY BYPASS GRAFT N/A 06/24/2017   Procedure: CORONARY ARTERY BYPASS GRAFTING (CABG) x 3 ; Using Left Internal Mammary Artery and Right Great Saphenous Leg Vein Harvested Endoscopically;  Surgeon: Gaye Pollack, MD;  Location: [facility];  Service: Open Heart Surgery;  Laterality: N/A;   LEFT HEART CATH AND CORONARY ANGIOGRAPHY N/A 05/25/2017   Procedure: LEFT HEART CATH AND CORONARY ANGIOGRAPHY;  Surgeon: Wellington Hampshire, MD;  Location: [facility];  Service: Cardiovascular;  Laterality: N/A;   TEE WITHOUT CARDIOVERSION N/A 06/24/2017   Procedure: TRANSESOPHAGEAL ECHOCARDIOGRAM (TEE);  Surgeon: Gaye Pollack,  MD;  Location: [facility];  Service: Open Heart Surgery;  Laterality: N/A;    Current Medications: Current Meds  Medication Sig   acetaminophen (TYLENOL) 325 MG tablet Take 650 mg by mouth every 6 (six) hours as needed for mild pain.   albuterol (VENTOLIN HFA) 108 (90 Base) MCG/ACT inhaler Inhale 1-2 puffs into the lungs every 4 (four) hours as needed for wheezing or shortness of breath.   aspirin EC 81 MG tablet Take 1 tablet (81 mg total) by mouth daily.   atorvastatin (LIPITOR) 80 MG tablet TAKE 1 TABLET BY MOUTH DAILY AT  6 PM   chlorthalidone (HYGROTON) 25 MG tablet Take 12.5 mg by mouth every morning.    colchicine 0.6 MG tablet TAKE 2 TABS ON ONSET OF  GOUT FLARE THEN REPEAT IN 1 HOUR OF ACUTE GOUT ATTACK   glucose blood test strip Use as instructed   guaiFENesin (MUCINEX) 600 MG 12 hr tablet Take 600 mg by mouth 2 (two) times daily as needed (congestion, cough).    hydrALAZINE (APRESOLINE) 50 MG tablet TAKE 1 TABLET BY MOUTH IN THE  MORNING AND 2 TABLETS IN THE  EVENING   irbesartan (AVAPRO) 300 MG tablet Take 300 mg by mouth daily.   isosorbide mononitrate (IMDUR) 60 MG 24 hr tablet TAKE 1 TABLET BY MOUTH AT  BEDTIME   JANUVIA 50 MG tablet TAKE 1 TABLET BY MOUTH DAILY   Lancets (ONETOUCH ULTRASOFT) lancets 1 each by Other route 1 day or 1 dose for 1 dose. Use as instructed   metFORMIN (GLUCOPHAGE) 500 MG tablet TAKE 1 TABLET BY MOUTH TWICE  DAILY WITH A MEAL     Allergies:   Penicillins, Lisinopril, Amiodarone, Amlodipine, Beta adrenergic blockers, Oxycodone, Zofran [ondansetron hcl], Cortisone, and Tramadol   Social History   Socioeconomic History   Marital status: Married    Spouse name: [name]   Number of children: 3   Years of education: 16   Highest education level: Not on file  Occupational History   Occupation: Musician  Tobacco Use   Smoking status: Former    Packs/day: 2.00    Years: 15.00    Total pack years: 30.00    Types: Cigarettes    Quit date: 07/26/1998    Years since quitting: 23.7   Smokeless tobacco: Never  Vaping Use   Vaping Use: Never used  Substance and Sexual Activity   Alcohol use: Not Currently    Alcohol/week: 0.0 standard drinks of alcohol   Drug use: No   Sexual activity: Yes  Other Topics Concern   Not on file  Social History Narrative         Social Determinants of Health   Financial Resource Strain: Low Risk  (02/18/2022)   Overall Financial Resource Strain (CARDIA)    Difficulty of Paying Living Expenses: Not hard at all  Food Insecurity: No Food Insecurity (02/18/2022)   Hunger Vital Sign    Worried About Running Out of Food in the Last Year: Never true    Ran Out of Food in the  Last Year: Never true  Transportation Needs: No Transportation Needs (02/18/2022)   PRAPARE - Hydrologist (Medical): No    Lack of Transportation (Non-Medical): No  Physical Activity: Inactive (02/18/2022)   Exercise Vital Sign    Days of Exercise per Week: 0 days    Minutes of Exercise per Session: 0 min  Stress: No Stress Concern Present (02/18/2022)   [employer]  [employer]upational [employer] - Occupational Stress Questionnaire    Feeling of Stress : Not at all  Social Connections: Moderately Integrated (02/18/2022)   Social Connection and Isolation Panel [NHANES]    Frequency of Communication with Friends and Family: Three times a week    Frequency of Social Gatherings with Friends and Family: Twice a week    Attends Religious Services: Never    Marine scientist or Organizations: Yes    Attends Music therapist: 1 to 4 times per year    Marital Status: Married     Family History: The patient's family history includes Cancer in his brother; Colon cancer (age of onset: 67) in his brother; Diabetes in his mother; Hypertension in his father and mother; Kidney disease in his mother; Stroke in his brother. There is no history of Esophageal cancer, Rectal cancer, Stomach cancer, or Colon polyps.  ROS:   Please see the history of present illness. All other systems reviewed and are negative.  EKGs/Labs/Other Studies Reviewed:    The following studies were reviewed today:  Cardiac telemetry 03/27/2020 Study Highlights Patient had a min HR of 55 bpm, max HR of 145 bpm, and avg HR of 95 bpm. No pauses or arrhythmias were seen. Isolated PACs and PVCs were seen.  Echo 02/16/2020 Impressions 1. Left ventricular ejection fraction, by estimation, is 60 to 65%. The left ventricle has normal function. The left ventricle has no regional wall motion abnormalities. There is mild concentric left ventricular hypertrophy. Left ventricular diastolic  parameters were normal. 2. Right ventricular systolic function is normal. The right ventricular size is normal. There is mildly elevated pulmonary artery systolic pressure. 3. The mitral valve is normal in structure. Mild mitral valve regurgitation. No evidence of mitral stenosis. 4. The aortic valve is tricuspid. Aortic valve regurgitation is not visualized. Mild aortic valve stenosis. 5. The inferior vena cava is normal in size with greater than 50% respiratory variability, suggesting right atrial pressure of 3 mmHg.  DG Chest 02/15/2020 Impressions  No active disease.  CT Angio Neck  10/19/2018 Impressions 1. Negative for emergent large vessel occlusion 2. Severe diffuse atherosclerotic disease 3. Diffuse atherosclerotic disease in the carotid artery bilaterally. 25% stenosis proximal right carotid artery. Moderate stenosis of the cavernous carotid bilaterally. 4. Small and diffusely disease right vertebral artery ends in PICA. Dominant left vertebral artery with moderate stenosis at the origin 5. Severe stenosis superior branch right MCA. 6. These results were called by telephone at the time of interpretation on 10/19/2018 at 3:42 pm to Dr. Roland Rack , who verbally acknowledged these results.  EKG:  EKG shows sinus rhythm, 96 bpm   Recent Labs: 08/02/2021: ALT 23; B Natriuretic Peptide 40.7; BUN 18; Creatinine, Ser 1.45; Hemoglobin 12.6; Platelets 226; Potassium 4.1; Sodium 140  Recent Lipid Panel    Component Value Date/Time   CHOL 129 07/09/2021 1123   CHOL 141 05/28/2020 1052   TRIG 77.0 07/09/2021 1123   HDL 59.20 07/09/2021 1123   HDL 61 05/28/2020 1052   CHOLHDL 2 07/09/2021 1123   VLDL 15.4 07/09/2021 1123   LDLCALC 55 07/09/2021 1123   LDLCALC 65 05/28/2020 1052     Risk Assessment/Calculations:      Physical Exam:    VS:  BP 124/70 (BP Location: Left Arm, Patient Position: Sitting, Cuff Size: Normal)   Pulse 80   Ht '5\' 3"'$  (1.6 m)   Wt 182 lb  (82.6 kg)   SpO2 96%   BMI 32.24 kg/m  Wt Readings from Last 3 Encounters:  04/13/22 182 lb (82.6 kg)  02/01/22 179 lb (81.2 kg)  10/21/21 185 lb (83.9 kg)     GEN:  Well nourished, well developed in no acute distress HEENT: Normal NECK: No JVD; positive radiation carotid bruits LYMPHATICS: No lymphadenopathy CARDIAC: RRR, 2/6 systolic murmur, rubs, gallops, prior CABG scar RESPIRATORY:  Clear to auscultation without rales, wheezing or rhonchi  ABDOMEN: Soft, non-tender, non-distended MUSCULOSKELETAL:  No edema; No deformity  SKIN: Warm and dry NEUROLOGIC:  Alert and oriented x 3 PSYCHIATRIC:  Normal affect   ASSESSMENT:    1. Aortic valve stenosis, etiology of cardiac valve disease unspecified   2. Coronary artery disease involving native heart without angina pectoris, unspecified vessel or lesion type   3. Hypertensive heart disease without heart failure     PLAN:    In order of problems listed above:  Heart Block Previous episodes of high degree second-degree heart block.  This resolved after stopping beta-blocker.  He is doing well.  No syncopal episodes any further.  Previously saw EP, Dr. Cristopher Peru.  No changes today.  Sinus tachycardia Heart rate today 80 bpm off of beta-blocker.  No worries.  Stable.  No changes.  Hypertension  -Continue with current medication management with hydralazine 50 mg 2 tablets in the evening, irbesartan 300 mg a day, isosorbide 60 mg a day.  Coronary artery disease  -Former bypass 2018.  Doing very well.  Does feel some mild shortness of breath when going up the stairs or riding his bike.  Major changes from prior.  We will continue to monitor.  Continue with daily exercise.  His right knee limits him.  Thinking about water aerobics previously.  Diabetes with hypertension - Hemoglobin A1c now 6.6 down from 7.1.  LDL now 55 down from 65.  Taking his atorvastatin 80 mg a day.  Excellent goal.  Aortic stenosis -Mild aortic  stenosis previously 2021.  We will go ahead and check echocardiogram again since has been over 2 years.  2/6 systolic murmur heard on exam.  1 year follow-up       Medication Adjustments/Labs and Tests Ordered: Current medicines are reviewed at length with the patient today.  Concerns regarding medicines are outlined above.  Orders Placed This Encounter  Procedures   ECHOCARDIOGRAM COMPLETE   No orders of the defined types were placed in this encounter.    Patient Instructions  Medication Instructions:  The current medical regimen is effective;  continue present plan and medications.  *If you need a refill on your cardiac medications before your next appointment, please call your pharmacy*  Testing/Procedures: Your physician has requested that you have an echocardiogram. Echocardiography is a painless test that uses sound waves to create images of your heart. It provides your doctor with information about the size and shape of your heart and how well your heart's chambers and valves are working. This procedure takes approximately one hour. There are no restrictions for this procedure.  Follow-Up: At [facility] you and your health needs are our priority.  As part of our continuing mission to provide you with exceptional heart care, we have created designated Provider Care Teams.  These Care Teams include your primary Cardiologist (physician) and Advanced Practice Providers (APPs -  Physician Assistants and Nurse Practitioners) who all work together to provide you with the care you need, when you need it.  We recommend signing up for the patient portal called "MyChart".  Sign up  information is provided on this After Visit Summary.  MyChart is used to connect with patients for Virtual Visits (Telemedicine).  Patients are able to view lab/test results, encounter notes, upcoming appointments, etc.  Non-urgent messages can be sent to your provider as well.   To learn more about  what you can do with MyChart, go to [email or web address].    Your next appointment:   1 year(s)  The format for your next appointment:   In Person  Provider:    Dr Candee Furbish      Important Information About Sugar           Signed, Candee Furbish, MD  04/13/2022 10:18 AM    [place]

## 2022-04-22 ENCOUNTER — Ambulatory Visit (HOSPITAL_COMMUNITY): Payer: Medicare Other | Attending: Cardiology

## 2022-04-22 DIAGNOSIS — I350 Nonrheumatic aortic (valve) stenosis: Secondary | ICD-10-CM

## 2022-04-22 LAB — ECHOCARDIOGRAM COMPLETE
AR max vel: 1.51 cm2
AV Area VTI: 1.49 cm2
AV Area mean vel: 1.54 cm2
AV Mean grad: 18 mmHg
AV Peak grad: 30.7 mmHg
Ao pk vel: 2.77 m/s
Area-P 1/2: 3.85 cm2
S' Lateral: 2.5 cm

## 2022-04-23 ENCOUNTER — Encounter: Payer: Self-pay | Admitting: Family Medicine

## 2022-04-23 ENCOUNTER — Other Ambulatory Visit: Payer: Self-pay | Admitting: Internal Medicine

## 2022-04-23 MED ORDER — SAXAGLIPTIN HCL 2.5 MG PO TABS: 2.5000 mg | ORAL_TABLET | Freq: Every day | ORAL | 1 refills | Status: DC

## 2022-04-30 ENCOUNTER — Other Ambulatory Visit: Payer: Self-pay | Admitting: Family Medicine

## 2022-04-30 DIAGNOSIS — E1122 Type 2 diabetes mellitus with diabetic chronic kidney disease: Secondary | ICD-10-CM

## 2022-04-30 NOTE — Telephone Encounter (Signed)
See last MyChart message.  Januvia cost increased, Onglyza was ordered.  [facility] pharmacy paperwork received that Onglyza was not covered.  Covered alternatives of Tradjenta or Januvia.  Please check into cost of either of those and I am okay with either being prescribed but let me know which is more cost effective and we will order that medication.  Form will be in my folder.

## 2022-05-04 NOTE — Telephone Encounter (Signed)
Update on this today please. It's been in the pool for 4 days!

## 2022-05-04 NOTE — Telephone Encounter (Signed)
Pt called back; states that he has decided to stay with the [facility] for 90 day supply.

## 2022-05-05 ENCOUNTER — Encounter: Payer: Self-pay | Admitting: Family Medicine

## 2022-05-05 ENCOUNTER — Ambulatory Visit (INDEPENDENT_AMBULATORY_CARE_PROVIDER_SITE_OTHER): Payer: Medicare Other | Admitting: Family Medicine

## 2022-05-05 VITALS — BP 158/78 | HR 85 | Temp 98.1°F | Ht 63.0 in | Wt 181.0 lb

## 2022-05-05 DIAGNOSIS — I2510 Atherosclerotic heart disease of native coronary artery without angina pectoris: Secondary | ICD-10-CM | POA: Diagnosis not present

## 2022-05-05 DIAGNOSIS — E785 Hyperlipidemia, unspecified: Secondary | ICD-10-CM | POA: Diagnosis not present

## 2022-05-05 DIAGNOSIS — E1122 Type 2 diabetes mellitus with diabetic chronic kidney disease: Secondary | ICD-10-CM

## 2022-05-05 DIAGNOSIS — I10 Essential (primary) hypertension: Secondary | ICD-10-CM

## 2022-05-05 DIAGNOSIS — N189 Chronic kidney disease, unspecified: Secondary | ICD-10-CM | POA: Diagnosis not present

## 2022-05-05 DIAGNOSIS — Z951 Presence of aortocoronary bypass graft: Secondary | ICD-10-CM

## 2022-05-05 DIAGNOSIS — I5032 Chronic diastolic (congestive) heart failure: Secondary | ICD-10-CM

## 2022-05-05 LAB — LIPID PANEL
Cholesterol: 122 mg/dL (ref 0–200)
HDL: 54.8 mg/dL (ref >39.00)
LDL Cholesterol: 50 mg/dL (ref 0–99)
NonHDL: 66.8
Total CHOL/HDL Ratio: 2
Triglycerides: 82 mg/dL (ref 0.0–149.0)
VLDL: 16.4 mg/dL (ref 0.0–40.0)

## 2022-05-05 LAB — COMPREHENSIVE METABOLIC PANEL
ALT: 15 U/L (ref 0–53)
AST: 18 U/L (ref 0–37)
Albumin: 4.5 g/dL (ref 3.5–5.2)
Alkaline Phosphatase: 51 U/L (ref 39–117)
BUN: 28 mg/dL — ABNORMAL HIGH (ref 6–23)
CO2: 29 mEq/L (ref 19–32)
Calcium: 9.4 mg/dL (ref 8.4–10.5)
Chloride: 106 mEq/L (ref 96–112)
Creatinine, Ser: 1.73 mg/dL — ABNORMAL HIGH (ref 0.40–1.50)
GFR: 38.19 mL/min — ABNORMAL LOW (ref >60.00)
Glucose, Bld: 102 mg/dL — ABNORMAL HIGH (ref 70–99)
Potassium: 4.1 mEq/L (ref 3.5–5.1)
Sodium: 142 mEq/L (ref 135–145)
Total Bilirubin: 0.5 mg/dL (ref 0.2–1.2)
Total Protein: 7.3 g/dL (ref 6.0–8.3)

## 2022-05-05 LAB — HEMOGLOBIN A1C: Hgb A1c MFr Bld: 6.7 % — ABNORMAL HIGH (ref 4.6–6.5)

## 2022-05-05 MED ORDER — CHLORTHALIDONE 25 MG PO TABS: 12.5000 mg | ORAL_TABLET | Freq: Every morning | ORAL | 2 refills | Status: DC

## 2022-05-05 MED ORDER — ATORVASTATIN CALCIUM 80 MG PO TABS: ORAL_TABLET | ORAL | 2 refills | Status: DC

## 2022-05-05 MED ORDER — HYDRALAZINE HCL 50 MG PO TABS: ORAL_TABLET | ORAL | 0 refills | Status: DC

## 2022-05-05 MED ORDER — IRBESARTAN 300 MG PO TABS: 300.0000 mg | ORAL_TABLET | Freq: Every day | ORAL | 2 refills | Status: DC

## 2022-05-05 MED ORDER — SITAGLIPTIN PHOSPHATE 50 MG PO TABS: 50.0000 mg | ORAL_TABLET | Freq: Every day | ORAL | 2 refills | Status: DC

## 2022-05-05 MED ORDER — METFORMIN HCL 500 MG PO TABS: 500.0000 mg | ORAL_TABLET | Freq: Two times a day (BID) | ORAL | 3 refills | Status: DC

## 2022-05-05 NOTE — Telephone Encounter (Signed)
Patient would like to continue Januvia pended most recent dose below if you could verify this is appropriate and sign off please.

## 2022-05-05 NOTE — Telephone Encounter (Signed)
Noted - see ov today

## 2022-05-05 NOTE — Progress Notes (Signed)
Subjective:  Patient ID: Antonio Wells, male    DOB: 02/12/1946  Age: 76 y.o. MRN: [id number]  CC:  Chief Complaint  Patient presents with   Diabetes    Pt states all is well    HPI Antonio Wells presents for   Diabetes: With CKD, CAD Improved on July 10 visit.  Treated with Januvia 50 mg daily, metformin 500 mg twice daily.  Deferred SGLT2's due to possible side effects.  He is on statin, ARB. Recent issues with Januvia coverage, cost increased from around $129 to over $400. Onglyza attempted but it appears that is not covered either.  Letter received from [facility] requesting either Januvia or Tradjenta.  Appears the Tradjenta was $419, Januvia $437.  Plan to remain on Januvia. Has not missed any doses. Would like to stay on Januvia for now and see if cost comes down. Tolerating current regimen without side effects.  Readings fasting: 110 Home readings postprandial: 88.  Symptomatic lows, none Microalbumin: 8.4 on 10/21/2021 and is on ARB. Optho, foot exam, pneumovax: Declines Pneumovax.  Up-to-date.  Had covid booster last month.   Still playing with band, going well.   Lab Results  Component Value Date   HGBA1C 6.6 (A) 02/01/2022   HGBA1C 7.6 (A) 10/21/2021   HGBA1C 7.4 (H) 07/09/2021   Lab Results  Component Value Date   MICROALBUR 8.4 (H) 10/21/2021   Reynolds 55 07/09/2021   CREATININE 1.45 (H) 08/02/2021    Hypertension: As above, history of CAD, CKD followed by nephrology and cardiology. Elevated today - no missed doses. BP 124/73 at cardiology 9/19. No recent diet changes. Heart block, sinus tach, aortic stenosis followed by cardiology.  Home readings: stable, no recent elevation.  BP Readings from Last 3 Encounters:  05/05/22 (!) 158/78  04/13/22 124/70  02/01/22 124/70   Lab Results  Component Value Date   CREATININE 1.45 (H) 08/02/2021   Hyperlipidemia: Lipitor 80 mg qd. Cardiac disease as above. No CP/dyspnea. Cardiology appt 04/13/22.  Last ate  last night.  Lab Results  Component Value Date   CHOL 129 07/09/2021   HDL 59.20 07/09/2021   LDLCALC 55 07/09/2021   TRIG 77.0 07/09/2021   CHOLHDL 2 07/09/2021   Lab Results  Component Value Date   ALT 23 08/02/2021   AST 29 08/02/2021   ALKPHOS 51 08/02/2021   BILITOT 0.6 08/02/2021    No recent gout flare.    History Patient Active Problem List   Diagnosis Date Noted   Syncope 02/15/2020   Bradycardia 02/15/2020   Hypertensive encephalopathy 10/19/2018   Chronic kidney disease, stage 3 10/19/2018   DM (diabetes mellitus), type 2 with renal complications ([place]) 62/70/3500   Cough 03/17/2018   Lower resp. 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   CAD (coronary artery disease) 05/25/2017   Unstable angina ([place])    Hypertensive heart disease 07/14/2015   Lower extremity edema 07/14/2015   Aortic stenosis 01/09/2015   OSA on CPAP 12/12/2014   Hypersomnia with sleep apnea 12/12/2014   Obstructive sleep apnea (adult) (pediatric) 02/04/2014   Hypersomnia, persistent 02/04/2014   Obesity 02/04/2014   Mitral regurgitation and aortic stenosis 01/02/2014   DOE (dyspnea on exertion) 11/26/2013   Gout 08/12/2011   Hypertension 08/12/2011   High  cholesterol 08/12/2011   OSA (obstructive sleep apnea) 08/12/2011   Diabetes type 2, uncontrolled 08/12/2011   Past Medical History:  Diagnosis Date   Arthritis    "knees" (07/11/2017)   CKD (chronic kidney disease), stage III ([place])    lov dr Thurmond [facility]ts [place] [phone] on chart   Complication of anesthesia    post op afibe after cabg x 2 2018   Coronary artery disease    a.  CABGx3 in 05/2017.   Gout    Heart murmur    mild, saw dr Lovena Le 03-26-2020 f/u prn   High cholesterol    Hypertension    Mild aortic stenosis    Mild  mitral regurgitation    OSA on CPAP    last used cpap 3 weeks ago   Phimosis    Postoperative atrial fibrillation ([place]) 06/2017   Sleep apnea    uses CPAP   Type II diabetes mellitus [facility])    Past Surgical History:  Procedure Laterality Date   CARDIAC CATHETERIZATION     CIRCUMCISION N/A 06/27/2020   Procedure: CIRCUMCISION ADULT;  Surgeon: Ceasar Mons, MD;  Location: [facility];  Service: Urology;  Laterality: N/A;   COLONOSCOPY  2008   CORONARY ARTERY BYPASS GRAFT N/A 06/24/2017   Procedure: CORONARY ARTERY BYPASS GRAFTING (CABG) x 3 ; Using Left Internal Mammary Artery and Right Great Saphenous Leg Vein Harvested Endoscopically;  Surgeon: Gaye Pollack, MD;  Location: [place] [facility];  Service: Open Heart Surgery;  Laterality: N/A;   LEFT HEART CATH AND CORONARY ANGIOGRAPHY N/A 05/25/2017   Procedure: LEFT HEART CATH AND CORONARY ANGIOGRAPHY;  Surgeon: Wellington Hampshire, MD;  Location: [place] [facility];  Service: Cardiovascular;  Laterality: N/A;   TEE WITHOUT CARDIOVERSION N/A 06/24/2017   Procedure: TRANSESOPHAGEAL ECHOCARDIOGRAM (TEE);  Surgeon: Gaye Pollack, MD;  Location: [place];  Service: Open Heart Surgery;  Laterality: N/A;   Allergies  Allergen Reactions   Penicillins Other (See Comments)    Has patient had a PCN reaction causing immediate rash, facial/tongue/throat swelling, SOB or lightheadedness with hypotension: No Has patient had a PCN reaction causing severe rash involving mucus membranes or skin necrosis: No Has patient had a PCN reaction that required hospitalization: No Has patient had a PCN reaction occurring within the last 10 years: No If all of the above answers are "NO", then may proceed with Cephalosporin use.   Passed out ? SYNCOPE ?   Lisinopril Cough   Amiodarone Nausea And Vomiting   Amlodipine Other (See Comments)    Causes dizzines   Beta Adrenergic Blockers     Do not take beta blockers causes heart block with coreg    Oxycodone Other (See Comments)    hallucinations   Zofran [Ondansetron Hcl] Other (See Comments)    Has an interaction with another medication pt takes    Cortisone Nausea And Vomiting and Other (See Comments)    HICCUPS   Tramadol Nausea And Vomiting and Other (See Comments)    HICCUPS   Prior to Admission medications   Medication Sig Start Date End Date Taking? Authorizing Provider  acetaminophen (TYLENOL) 325 MG tablet Take 650 mg by mouth every 6 (six) hours as needed for mild pain.   Yes [provider]  aspirin EC 81 MG tablet Take 1 tablet (81 mg total) by mouth daily. 09/02/17  Yes Dunn, Dayna N, PA-C  atorvastatin (LIPITOR) 80 MG tablet TAKE 1 TABLET BY MOUTH DAILY AT  6 PM  03/01/22  Yes [name], MD  chlorthalidone (HYGROTON) 25 MG tablet Take 12.5 mg by mouth every morning.  09/10/19  Yes [provider]  colchicine 0.6 MG tablet TAKE 2 TABS ON ONSET OF GOUT FLARE THEN REPEAT IN 1 HOUR OF ACUTE GOUT ATTACK 07/06/19  Yes [name], MD  glucose blood test strip Use as instructed 01/21/20  Yes [name], MD  guaiFENesin (MUCINEX) 600 MG 12 hr tablet Take 600 mg by mouth 2 (two) times daily as needed (congestion, cough).    Yes [provider]  hydrALAZINE (APRESOLINE) 50 MG tablet TAKE 1 TABLET BY MOUTH IN THE  MORNING AND 2 TABLETS IN THE  EVENING 04/12/22  Yes Jerline Pain, MD  irbesartan (AVAPRO) 300 MG tablet Take 300 mg by mouth daily.   Yes [provider]  isosorbide mononitrate (IMDUR) 60 MG 24 hr tablet TAKE 1 TABLET BY MOUTH AT  BEDTIME 04/23/22  Yes Evans Lance, MD  metFORMIN (GLUCOPHAGE) 500 MG tablet TAKE 1 TABLET BY MOUTH TWICE  DAILY WITH A MEAL 12/07/21  Yes [name], MD  albuterol (VENTOLIN HFA) 108 (90 Base) MCG/ACT inhaler Inhale 1-2 puffs into the lungs every 4 (four) hours as needed for wheezing or shortness of breath. Patient not taking: Reported on 05/05/2022 07/27/21   Barrett Henle, MD   Lancets [facility] ULTRASOFT) lancets 1 each by Other route 1 day or 1 dose for 1 dose. Use as instructed 01/30/20 04/13/22  [name], MD   Social History   Socioeconomic History   Marital status: Married    Spouse name: [name]   Number of children: 3   Years of education: 16   Highest education level: Not on file  Occupational History   Occupation: Musician  Tobacco Use   Smoking status: Former    Packs/day: 2.00    Years: 15.00    Total pack years: 30.00    Types: Cigarettes    Quit date: 07/26/1998    Years since quitting: 23.7   Smokeless tobacco: Never  Vaping Use   Vaping Use: Never used  Substance and Sexual Activity   Alcohol use: Not Currently    Alcohol/week: 0.0 standard drinks of alcohol   Drug use: No   Sexual activity: Yes  Other Topics Concern   Not on file  Social History Narrative         Social Determinants of Health   Financial Resource Strain: Low Risk  (02/18/2022)   Overall Financial Resource Strain (CARDIA)    Difficulty of Paying Living Expenses: Not hard at all  Food Insecurity: No Food Insecurity (02/18/2022)   Hunger Vital Sign    Worried About Running Out of Food in the Last Year: Never true    Ran Out of Food in the Last Year: Never true  Transportation Needs: No Transportation Needs (02/18/2022)   PRAPARE - Hydrologist (Medical): No    Lack of Transportation (Non-Medical): No  Physical Activity: Inactive (02/18/2022)   Exercise Vital Sign    Days of Exercise per Week: 0 days    Minutes of Exercise per Session: 0 min  Stress: No Stress Concern Present (02/18/2022)   Wisdom    Feeling of Stress : Not at all  Social Connections: Moderately Integrated (02/18/2022)   Social Connection and Isolation Panel [NHANES]    Frequency of Communication with Friends and Family: Three  times a week    Frequency of Social Gatherings with Friends and  Family: Twice a week    Attends Religious Services: Never    Marine scientist or Organizations: Yes    Attends Archivist Meetings: 1 to 4 times per year    Marital Status: Married  Human resources officer Violence: Not At Risk (02/18/2022)   Humiliation, Afraid, Rape, and Kick questionnaire    Fear of Current or Ex-Partner: No    Emotionally Abused: No    Physically Abused: No    Sexually Abused: No    Review of Systems  Constitutional:  Negative for fatigue and unexpected weight change.  Eyes:  Negative for visual disturbance.  Respiratory:  Negative for cough, chest tightness and shortness of breath.   Cardiovascular:  Negative for chest pain, palpitations and leg swelling.  Gastrointestinal:  Negative for abdominal pain and blood in stool.  Neurological:  Negative for dizziness, light-headedness and headaches.     Objective:   Vitals:   05/05/22 0945 05/05/22 1027  BP: (!) 144/58 (!) 158/78  Pulse: 85   Temp: 98.1 F (36.7 C)   SpO2: 96%   Weight: 181 lb (82.1 kg)   Height: '5\' 3"'$  (1.6 m)      Physical Exam Vitals reviewed.  Constitutional:      Appearance: He is well-developed.  HENT:     Head: Normocephalic and atraumatic.  Neck:     Vascular: No carotid bruit or JVD.  Cardiovascular:     Rate and Rhythm: Normal rate and regular rhythm.     Heart sounds: Murmur (2/6 SEM) heard.  Pulmonary:     Effort: Pulmonary effort is normal.     Breath sounds: Normal breath sounds. No rales.  Musculoskeletal:     Right lower leg: No edema.     Left lower leg: No edema.  Skin:    General: Skin is warm and dry.  Neurological:     Mental Status: He is alert and oriented to person, place, and time.  Psychiatric:        Mood and Affect: Mood normal.        Assessment & Plan:  Antonio Wells is a 76 y.o. male . Type 2 diabetes mellitus with chronic kidney disease, without long-term current use of insulin, unspecified CKD stage ([place]) - Plan: metFORMIN  (GLUCOPHAGE) 500 MG tablet, Hemoglobin A1c  Coronary artery disease involving native heart without angina pectoris, unspecified vessel or lesion type - Plan: atorvastatin (LIPITOR) 80 MG tablet  S/P CABG x 3 - Plan: hydrALAZINE (APRESOLINE) 50 MG tablet  Hyperlipidemia, unspecified hyperlipidemia type - Plan: hydrALAZINE (APRESOLINE) 50 MG tablet, Lipid panel, Comprehensive metabolic panel  Chronic diastolic CHF (congestive heart failure), NYHA class 2 ([place]) - Plan: hydrALAZINE (APRESOLINE) 50 MG tablet  Essential hypertension - Plan: hydrALAZINE (APRESOLINE) 50 MG tablet, chlorthalidone (HYGROTON) 25 MG tablet, irbesartan (AVAPRO) 300 MG tablet, Comprehensive metabolic panel  Previously improved diabetes, discussed potential medication alternatives given cost of Januvia but decided to remain on same dose for now as currently tolerated.  Still would like to avoid SGLT2's.  Check updated A1c, labs.  Blood pressure elevated in office but higher than recent cardiology visit.  Home monitoring discussed with RTC precautions or call if elevated readings to decide on med changes.  Here or cardiology.  In regards to his CHF, appears euvolemic, continue same med regimen and follow-up with specialist as planned   Meds ordered this encounter  Medications  atorvastatin (LIPITOR) 80 MG tablet    Sig: TAKE 1 TABLET BY MOUTH DAILY AT  6 PM    Dispense:  100 tablet    Refill:  2    Please send a replace/new response with 100-Day Supply if appropriate to maximize member benefit. Requesting 1 year supply.   metFORMIN (GLUCOPHAGE) 500 MG tablet    Sig: Take 1 tablet (500 mg total) by mouth 2 (two) times daily with a meal.    Dispense:  180 tablet    Refill:  3    Requesting 1 year supply   hydrALAZINE (APRESOLINE) 50 MG tablet    Sig: TAKE 1 TABLET BY MOUTH IN THE  MORNING AND 2 TABLETS IN THE  EVENING    Dispense:  90 tablet    Refill:  0    Pt must keep upcoming appt in September 2023 with Dr.  [name] before anymore refills. Thank you Final Attempt   chlorthalidone (HYGROTON) 25 MG tablet    Sig: Take 0.5 tablets (12.5 mg total) by mouth every morning.    Dispense:  90 tablet    Refill:  2   irbesartan (AVAPRO) 300 MG tablet    Sig: Take 1 tablet (300 mg total) by mouth daily.    Dispense:  90 tablet    Refill:  2   Patient Instructions  Let me know if we need to change diabetes meds due to cost.  No change in medications for now.  Last A1c looks great.  I am hoping today's will look good as well.  17-monthfollow-up and if stable at that time we may space out visits to every 6 months.   Blood pressure was elevated today, but normal at cardiology visit.  Monitor those readings at home and if over 140/90, let me know or cardiology note to decide on medication changes.  I did refill medications today.  If any concerns on labs I will let you know.  Take care.      Signed,   JMerri Ray MD [facility] 05/05/22 11:33 AM

## 2022-05-05 NOTE — Patient Instructions (Addendum)
Let me know if we need to change diabetes meds due to cost.  No change in medications for now.  Last A1c looks great.  I am hoping today's will look good as well.  7-monthfollow-up and if stable at that time we may space out visits to every 6 months.   Blood pressure was elevated today, but normal at cardiology visit.  Monitor those readings at home and if over 140/90, let me know or cardiology note to decide on medication changes.  I did refill medications today.  If any concerns on labs I will let you know.  Take care.

## 2022-06-03 ENCOUNTER — Other Ambulatory Visit: Payer: Self-pay | Admitting: Family Medicine

## 2022-06-03 DIAGNOSIS — I5032 Chronic diastolic (congestive) heart failure: Secondary | ICD-10-CM

## 2022-06-03 DIAGNOSIS — Z951 Presence of aortocoronary bypass graft: Secondary | ICD-10-CM

## 2022-06-03 DIAGNOSIS — I10 Essential (primary) hypertension: Secondary | ICD-10-CM

## 2022-06-03 DIAGNOSIS — E785 Hyperlipidemia, unspecified: Secondary | ICD-10-CM

## 2022-07-13 ENCOUNTER — Other Ambulatory Visit: Payer: Self-pay | Admitting: Family Medicine

## 2022-07-13 DIAGNOSIS — I10 Essential (primary) hypertension: Secondary | ICD-10-CM

## 2022-07-13 DIAGNOSIS — E785 Hyperlipidemia, unspecified: Secondary | ICD-10-CM

## 2022-07-13 DIAGNOSIS — Z951 Presence of aortocoronary bypass graft: Secondary | ICD-10-CM

## 2022-07-13 DIAGNOSIS — I5032 Chronic diastolic (congestive) heart failure: Secondary | ICD-10-CM

## 2022-07-14 MED ORDER — HYDRALAZINE HCL 50 MG PO TABS: ORAL_TABLET | ORAL | 0 refills | Status: DC

## 2022-07-15 ENCOUNTER — Encounter: Payer: Self-pay | Admitting: Family Medicine

## 2022-07-15 ENCOUNTER — Ambulatory Visit (INDEPENDENT_AMBULATORY_CARE_PROVIDER_SITE_OTHER): Payer: Medicare Other | Admitting: Family Medicine

## 2022-07-15 ENCOUNTER — Ambulatory Visit (INDEPENDENT_AMBULATORY_CARE_PROVIDER_SITE_OTHER)
Admission: RE | Admit: 2022-07-15 | Discharge: 2022-07-15 | Disposition: A | Discharge status: Home / Self Care | Payer: Medicare Other | Source: Ambulatory Visit | Attending: Family Medicine | Admitting: Family Medicine

## 2022-07-15 VITALS — BP 138/78 | HR 84 | Temp 98.4°F | Ht 63.0 in | Wt 185.4 lb

## 2022-07-15 DIAGNOSIS — J22 Unspecified acute lower respiratory infection: Secondary | ICD-10-CM

## 2022-07-15 DIAGNOSIS — R059 Cough, unspecified: Secondary | ICD-10-CM | POA: Diagnosis not present

## 2022-07-15 LAB — POC COVID19 BINAXNOW: SARS Coronavirus 2 Ag: NEGATIVE

## 2022-07-15 MED ORDER — BENZONATATE 100 MG PO CAPS: 100.0000 mg | ORAL_CAPSULE | Freq: Three times a day (TID) | ORAL | 0 refills | Status: DC | PRN

## 2022-07-15 MED ORDER — AZITHROMYCIN 250 MG PO TABS: ORAL_TABLET | ORAL | 0 refills | Status: AC

## 2022-07-15 NOTE — Patient Instructions (Addendum)
Please have x-ray performed at the [facility] location below.  Start azithromycin for possible secondary infection or bronchitis/pneumonia.  Again the x-ray should give [place] some more information.  Tessalon Perles if needed for cough, make sure to get plenty of rest and see other information on cough below.  If shortness of breath, or acute worsening symptoms, be seen in urgent care or ER.  [facility]  Walk in 8:30-4:30 during weekdays, no appointment needed [place].  [place], [place] [id number]  Cough, Adult Coughing is a reflex that clears your throat and your airways (respiratory system). Coughing helps to heal and protect your lungs. It is normal to cough occasionally, but a cough that happens with other symptoms or lasts a long time may be a sign of a condition that needs treatment. An acute cough may only last 2-3 weeks, while a chronic cough may last 8 or more weeks. Coughing is commonly caused by: Infection of the respiratory systemby viruses or bacteria. Breathing in substances that irritate your lungs. Allergies. Asthma. Mucus that runs down the back of your throat (postnasal drip). Smoking. Acid backing up from the stomach into the esophagus (gastroesophageal reflux). Certain medicines. Chronic lung problems. Other medical conditions such as heart failure or a blood clot in the lung (pulmonary embolism). Follow these instructions at home: Medicines Take over-the-counter and prescription medicines only as told by your health care provider. Talk with your health care provider before you take a cough suppressant medicine. Lifestyle Avoid cigarette smoke. Do not use any products that contain nicotine or tobacco, such as cigarettes, e-cigarettes, and chewing tobacco. If you need help quitting, ask your health care provider. Drink enough fluid to keep your urine pale yellow. Avoid caffeine. Do not drink alcohol if your health care provider tells you not to drink. General  instructions  Pay close attention to changes in your cough. Tell your health care provider about them. Always cover your mouth when you cough. Avoid things that make you cough, such as perfume, candles, cleaning products, or campfire or tobacco smoke. If the air is dry, use a cool mist vaporizer or humidifier in your bedroom or your home to help loosen secretions. If your cough is worse at night, try to sleep in a semi-upright position. Rest as needed. Keep all follow-up visits as told by your health care provider. This is important. Contact a health care provider if you: Have new symptoms. Cough up pus. Have a cough that does not get better after 2-3 weeks or gets worse. Cannot control your cough with cough suppressant medicines and you are losing sleep. Have pain that gets worse or pain that is not helped with medicine. Have a fever. Have unexplained weight loss. Have night sweats. Get help right away if: You cough up blood. You have difficulty breathing. Your heartbeat is very fast. These symptoms may represent a serious problem that is an emergency. Do not wait to see if the symptoms will go away. Get medical help right away. Call your local emergency services (911 in the [place] Do not drive yourself to the hospital. Summary Coughing is a reflex that clears your throat and your airways. It is normal to cough occasionally, but a cough that happens with other symptoms or lasts a long time may be a sign of a condition that needs treatment. Take over-the-counter and prescription medicines only as told by your health care provider. Always cover your mouth when you cough. Contact a health care provider if you have  new symptoms or a cough that does not get better after 2-3 weeks or gets worse. This information is not intended to replace advice given to you by your health care provider. Make sure you discuss any questions you have with your health care provider. Document Revised: 12/15/2021  Document Reviewed: 07/31/2018 Elsevier Patient Education  [place].

## 2022-07-15 NOTE — Progress Notes (Signed)
Subjective:  Patient ID: Antonio Wells, male    DOB: February 17, 1946  Age: 76 y.o. MRN: [id number]  CC:  Chief Complaint  Patient presents with   Cough    Pt states he has had a dry/ wet cough for the last 10 days, he had a spell of coughing so bad that he vomited, pt has chills    HPI Antonio Wells presents for   Cough: Started 10 days ago, cough. Unknown color. No initial fever, chills, body ache or fever. Tx: coricidin nyquil, otc cough syrup.  Thought to be improving few days ago, then worse. More cough, and at night, emesis once after coughing fit today. No fever, some chills yesterday.  No home covid testing.  Wife with similar sx's few weeks ago.  No cp, dyspnea, drinking fluids,no confusion.  Pcn allergic.   History Patient Active Problem List   Diagnosis Date Noted   Syncope 02/15/2020   Bradycardia 02/15/2020   Hypertensive encephalopathy 10/19/2018   Chronic kidney disease, stage 3 10/19/2018   DM (diabetes mellitus), type 2 with renal complications ([place]) 05/19/8526   Cough 03/17/2018   Lower resp. 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   CAD (coronary artery disease) 05/25/2017   Unstable angina (HCC)    Hypertensive heart disease 07/14/2015   Lower extremity edema 07/14/2015   Aortic stenosis 01/09/2015   OSA on CPAP 12/12/2014   Hypersomnia with sleep apnea 12/12/2014   Obstructive sleep apnea (adult) (pediatric) 02/04/2014   Hypersomnia, persistent 02/04/2014   Obesity 02/04/2014   Mitral regurgitation and aortic stenosis 01/02/2014   DOE (dyspnea on exertion) 11/26/2013   Gout 08/12/2011   Hypertension 08/12/2011   High cholesterol 08/12/2011   OSA (obstructive sleep apnea) 08/12/2011   Diabetes type 2, uncontrolled 08/12/2011   Past Medical  History:  Diagnosis Date   Arthritis    "knees" (07/11/2017)   CKD (chronic kidney disease), stage III ([place])    lov dr Thurmond [facility] [place] [phone] on chart   Complication of anesthesia    post op afibe after cabg x 2 2018   Coronary artery disease    a.  CABGx3 in 05/2017.   Gout    Heart murmur    mild, saw dr Lovena Le 03-26-2020 f/u prn   High cholesterol    Hypertension    Mild aortic stenosis    Mild mitral regurgitation    OSA on CPAP    last used cpap 3 weeks ago   Phimosis    Postoperative atrial fibrillation ([place]) 06/2017   Sleep apnea    uses CPAP   Type II diabetes mellitus [facility])    Past Surgical History:  Procedure Laterality Date   CARDIAC CATHETERIZATION     CIRCUMCISION N/A 06/27/2020   Procedure: CIRCUMCISION ADULT;  Surgeon: Ceasar Mons, MD;  Location: [facility];  Service: Urology;  Laterality: N/A;   COLONOSCOPY  2008   CORONARY ARTERY BYPASS GRAFT N/A 06/24/2017   Procedure: CORONARY ARTERY BYPASS GRAFTING (CABG) x 3 ; Using Left Internal Mammary Artery and Right Great Saphenous Leg Vein Harvested Endoscopically;  Surgeon: Gaye Pollack, MD;  Location: [place] OR;  Service: Open Heart Surgery;  Laterality: N/A;   LEFT HEART CATH AND CORONARY ANGIOGRAPHY N/A 05/25/2017   Procedure:  LEFT HEART CATH AND CORONARY ANGIOGRAPHY;  Surgeon: Wellington Hampshire, MD;  Location: [place] [facility];  Service: Cardiovascular;  Laterality: N/A;   TEE WITHOUT CARDIOVERSION N/A 06/24/2017   Procedure: TRANSESOPHAGEAL ECHOCARDIOGRAM (TEE);  Surgeon: Gaye Pollack, MD;  Location: [place];  Service: Open Heart Surgery;  Laterality: N/A;   Allergies  Allergen Reactions   Penicillins Other (See Comments)    Has patient had a PCN reaction causing immediate rash, facial/tongue/throat swelling, SOB or lightheadedness with hypotension: No Has patient had a PCN reaction causing severe rash involving mucus membranes or skin necrosis: No Has patient had a PCN reaction  that required hospitalization: No Has patient had a PCN reaction occurring within the last 10 years: No If all of the above answers are "NO", then may proceed with Cephalosporin use.   Passed out ? SYNCOPE ?   Lisinopril Cough   Amiodarone Nausea And Vomiting   Amlodipine Other (See Comments)    Causes dizzines   Beta Adrenergic Blockers     Do not take beta blockers causes heart block with coreg   Oxycodone Other (See Comments)    hallucinations   Zofran [Ondansetron Hcl] Other (See Comments)    Has an interaction with another medication pt takes    Cortisone Nausea And Vomiting and Other (See Comments)    HICCUPS   Tramadol Nausea And Vomiting and Other (See Comments)    HICCUPS   Prior to Admission medications   Medication Sig Start Date End Date Taking? Authorizing Provider  acetaminophen (TYLENOL) 325 MG tablet Take 650 mg by mouth every 6 (six) hours as needed for mild pain.   Yes [provider]  aspirin EC 81 MG tablet Take 1 tablet (81 mg total) by mouth daily. 09/02/17  Yes Dunn, Dayna N, PA-C  atorvastatin (LIPITOR) 80 MG tablet TAKE 1 TABLET BY MOUTH DAILY AT  6 PM 05/05/22  Yes [name], MD  chlorthalidone (HYGROTON) 25 MG tablet Take 0.5 tablets (12.5 mg total) by mouth every morning. 05/05/22  Yes [name], MD  colchicine 0.6 MG tablet TAKE 2 TABS ON ONSET OF GOUT FLARE THEN REPEAT IN 1 HOUR OF ACUTE GOUT ATTACK 07/06/19  Yes [name], MD  glucose blood test strip Use as instructed 01/21/20  Yes [name], MD  hydrALAZINE (APRESOLINE) 50 MG tablet TAKE 1 TABLET BY MOUTH IN THE  MORNING AND 2 TABLETS IN THE  EVENING 07/14/22  Yes [name], MD  irbesartan (AVAPRO) 300 MG tablet Take 1 tablet (300 mg total) by mouth daily. 05/05/22  Yes [name], MD  isosorbide mononitrate (IMDUR) 60 MG 24 hr tablet TAKE 1 TABLET BY MOUTH AT  BEDTIME 04/23/22  Yes Evans Lance, MD  metFORMIN (GLUCOPHAGE) 500 MG tablet Take 1  tablet (500 mg total) by mouth 2 (two) times daily with a meal. 05/05/22  Yes [name], MD  sitaGLIPtin (JANUVIA) 50 MG tablet Take 1 tablet (50 mg total) by mouth daily. 05/05/22  Yes [name], MD  guaiFENesin (MUCINEX) 600 MG 12 hr tablet Take 600 mg by mouth 2 (two) times daily as needed (congestion, cough).  Patient not taking: Reported on 07/15/2022    [provider]  Lancets [facility] ULTRASOFT) lancets 1 each by Other route 1 day or 1 dose for 1 dose. Use as instructed 01/30/20 04/13/22  [name], MD   Social History   Socioeconomic History   Marital  status: Married    Spouse name: [name]   Number of children: 3   Years of education: 16   Highest education level: Not on file  Occupational History   Occupation: Musician  Tobacco Use   Smoking status: Former    Packs/day: 2.00    Years: 15.00    Total pack years: 30.00    Types: Cigarettes    Quit date: 07/26/1998    Years since quitting: 23.9   Smokeless tobacco: Never  Vaping Use   Vaping Use: Never used  Substance and Sexual Activity   Alcohol use: Not Currently    Alcohol/week: 0.0 standard drinks of alcohol   Drug use: No   Sexual activity: Yes  Other Topics Concern   Not on file  Social History Narrative         Social Determinants of Health   Financial Resource Strain: Low Risk  (02/18/2022)   Overall Financial Resource Strain (CARDIA)    Difficulty of Paying Living Expenses: Not hard at all  Food Insecurity: No Food Insecurity (02/18/2022)   Hunger Vital Sign    Worried About Running Out of Food in the Last Year: Never true    Ran Out of Food in the Last Year: Never true  Transportation Needs: No Transportation Needs (02/18/2022)   PRAPARE - Hydrologist (Medical): No    Lack of Transportation (Non-Medical): No  Physical Activity: Inactive (02/18/2022)   Exercise Vital Sign    Days of Exercise per Week: 0 days    Minutes of Exercise per Session:  0 min  Stress: No Stress Concern Present (02/18/2022)   [place]    Feeling of Stress : Not at all  Social Connections: Moderately Integrated (02/18/2022)   Social Connection and Isolation Panel [NHANES]    Frequency of Communication with Friends and Family: Three times a week    Frequency of Social Gatherings with Friends and Family: Twice a week    Attends Religious Services: Never    Marine scientist or Organizations: Yes    Attends Archivist Meetings: 1 to 4 times per year    Marital Status: Married  Human resources officer Violence: Not At Risk (02/18/2022)   Humiliation, Afraid, Rape, and Kick questionnaire    Fear of Current or Ex-Partner: No    Emotionally Abused: No    Physically Abused: No    Sexually Abused: No    Review of Systems Per HPI  Objective:   Vitals:   07/15/22 1147  BP: 138/78  Pulse: 84  Temp: 98.4 F (36.9 C)  SpO2: 100%  Weight: 185 lb 6.4 oz (84.1 kg)  Height: '5\' 3"'$  (1.6 m)     Physical Exam Vitals reviewed.  Constitutional:      Appearance: He is well-developed.  HENT:     Head: Normocephalic and atraumatic.     Right Ear: Tympanic membrane, ear canal and external ear normal.     Left Ear: Tympanic membrane, ear canal and external ear normal.     Nose: No rhinorrhea.     Mouth/Throat:     Pharynx: No oropharyngeal exudate or posterior oropharyngeal erythema.  Eyes:     Conjunctiva/sclera: Conjunctivae normal.     Pupils: Pupils are equal, round, and reactive to light.  Cardiovascular:     Rate and Rhythm: Normal rate and regular rhythm.     Heart sounds: Normal heart sounds. No murmur heard. Pulmonary:  Effort: Pulmonary effort is normal.     Breath sounds: No wheezing or rales.     Comments: Possible slight decreased breath sounds on right lower lobe with a few coarse breath sounds, distant.  Normal effort, no distress.  No wheeze, no stridor. Abdominal:      Palpations: Abdomen is soft.     Tenderness: There is no abdominal tenderness.  Musculoskeletal:     Cervical back: Neck supple.  Lymphadenopathy:     Cervical: No cervical adenopathy.  Skin:    General: Skin is warm and dry.     Findings: No rash.  Neurological:     Mental Status: He is alert and oriented to person, place, and time.  Psychiatric:        Behavior: Behavior normal.        Assessment & Plan:  Antonio Wells is a 76 y.o. male . Cough in adult - Plan: POC COVID-19 BinaxNow, benzonatate (TESSALON) 100 MG capsule, azithromycin (ZITHROMAX) 250 MG tablet, DG Chest 2 View, CANCELED: POC Influenza A&B(BINAX/QUICKVUE)  LRTI (lower respiratory tract infection) - Plan: benzonatate (TESSALON) 100 MG capsule, azithromycin (ZITHROMAX) 250 MG tablet, DG Chest 2 View Cough with posttussive emesis, worsening cough after initial improvement.  Question few rhonchi or asymmetric breath sounds on exam, chest x-ray without apparent infiltrate.  Will start azithromycin for possible early community-acquired pneumonia, bronchitis but reassuring vital signs in office, outpatient treatment initially with RTC/ER precautions given.  Tessalon Perles for cough.  Potential side effects and risks of antibiotics discussed.  Meds ordered this encounter  Medications   benzonatate (TESSALON) 100 MG capsule    Sig: Take 1 capsule (100 mg total) by mouth 3 (three) times daily as needed for cough.    Dispense:  20 capsule    Refill:  0   azithromycin (ZITHROMAX) 250 MG tablet    Sig: Take 2 tablets on day 1, then 1 tablet daily on days 2 through 5    Dispense:  6 tablet    Refill:  0   Patient Instructions  Please have x-ray performed at the [facility] location below.  Start azithromycin for possible secondary infection or bronchitis/pneumonia.  Again the x-ray should give [place] some more information.  Tessalon Perles if needed for cough, make sure to get plenty of rest and see other information  on cough below.  If shortness of breath, or acute worsening symptoms, be seen in urgent care or ER.  [facility]  Walk in 8:30-4:30 during weekdays, no appointment needed [address].  [place], [place] [id number]  Cough, Adult Coughing is a reflex that clears your throat and your airways (respiratory system). Coughing helps to heal and protect your lungs. It is normal to cough occasionally, but a cough that happens with other symptoms or lasts a long time may be a sign of a condition that needs treatment. An acute cough may only last 2-3 weeks, while a chronic cough may last 8 or more weeks. Coughing is commonly caused by: Infection of the respiratory systemby viruses or bacteria. Breathing in substances that irritate your lungs. Allergies. Asthma. Mucus that runs down the back of your throat (postnasal drip). Smoking. Acid backing up from the stomach into the esophagus (gastroesophageal reflux). Certain medicines. Chronic lung problems. Other medical conditions such as heart failure or a blood clot in the lung (pulmonary embolism). Follow these instructions at home: Medicines Take over-the-counter and prescription medicines only as told by your health care provider. Talk with your  health care provider before you take a cough suppressant medicine. Lifestyle Avoid cigarette smoke. Do not use any products that contain nicotine or tobacco, such as cigarettes, e-cigarettes, and chewing tobacco. If you need help quitting, ask your health care provider. Drink enough fluid to keep your urine pale yellow. Avoid caffeine. Do not drink alcohol if your health care provider tells you not to drink. General instructions  Pay close attention to changes in your cough. Tell your health care provider about them. Always cover your mouth when you cough. Avoid things that make you cough, such as perfume, candles, cleaning products, or campfire or tobacco smoke. If the air is dry, use a cool mist vaporizer  or humidifier in your bedroom or your home to help loosen secretions. If your cough is worse at night, try to sleep in a semi-upright position. Rest as needed. Keep all follow-up visits as told by your health care provider. This is important. Contact a health care provider if you: Have new symptoms. Cough up pus. Have a cough that does not get better after 2-3 weeks or gets worse. Cannot control your cough with cough suppressant medicines and you are losing sleep. Have pain that gets worse or pain that is not helped with medicine. Have a fever. Have unexplained weight loss. Have night sweats. Get help right away if: You cough up blood. You have difficulty breathing. Your heartbeat is very fast. These symptoms may represent a serious problem that is an emergency. Do not wait to see if the symptoms will go away. Get medical help right away. Call your local emergency services (911 in the [place] Do not drive yourself to the hospital. Summary Coughing is a reflex that clears your throat and your airways. It is normal to cough occasionally, but a cough that happens with other symptoms or lasts a long time may be a sign of a condition that needs treatment. Take over-the-counter and prescription medicines only as told by your health care provider. Always cover your mouth when you cough. Contact a health care provider if you have new symptoms or a cough that does not get better after 2-3 weeks or gets worse. This information is not intended to replace advice given to you by your health care provider. Make sure you discuss any questions you have with your health care provider. Document Revised: 12/15/2021 Document Reviewed: 07/31/2018 Elsevier Patient Education  [place],   [name], MD [place], [facility] 07/15/22 12:23 PM

## 2022-07-16 ENCOUNTER — Encounter: Payer: Self-pay | Admitting: Family Medicine

## 2022-07-16 DIAGNOSIS — R059 Cough, unspecified: Secondary | ICD-10-CM

## 2022-07-16 MED ORDER — PROMETHAZINE-CODEINE 6.25-10 MG/5ML PO SYRP: 5.0000 mL | ORAL_SOLUTION | Freq: Four times a day (QID) | ORAL | 0 refills | Status: DC | PRN

## 2022-07-16 NOTE — Telephone Encounter (Signed)
Pt is requesting codeine cough syrup in place of [facility]ls

## 2022-07-20 MED ORDER — PROMETHAZINE-CODEINE 6.25-10 MG/5ML PO SYRP: 5.0000 mL | ORAL_SOLUTION | Freq: Four times a day (QID) | ORAL | 0 refills | Status: DC | PRN

## 2022-07-20 NOTE — Addendum Note (Signed)
Addended by: [name] R on: 07/20/2022 08:47 AM   Modules accepted: Orders

## 2022-07-21 MED ORDER — CHERATUSSIN AC 100-10 MG/5ML PO SOLN: 5.0000 mL | Freq: Every evening | ORAL | 0 refills | Status: DC | PRN

## 2022-07-21 MED ORDER — PROMETHAZINE-CODEINE 6.25-10 MG/5ML PO SYRP: 5.0000 mL | ORAL_SOLUTION | Freq: Four times a day (QID) | ORAL | 0 refills | Status: DC | PRN

## 2022-07-21 NOTE — Addendum Note (Signed)
Addended by: Patrcia Dolly on: 07/21/2022 09:47 AM   Modules accepted: Orders

## 2022-07-21 NOTE — Telephone Encounter (Addendum)
Spoke with pharmacist at [facility]. Phenergan with codeine not available. Cheratussin AC is avaialble. Rx sent, but [facility] defaulted back to [facility] [address] Verified with pharmacist at this location- available and will be filled. Patient advised on phone.

## 2022-07-21 NOTE — Telephone Encounter (Signed)
Please check into status of this Rx with [facility].

## 2022-07-21 NOTE — Addendum Note (Signed)
Addended by: [name] R on: 07/21/2022 05:49 PM   Modules accepted: Orders

## 2022-07-21 NOTE — Telephone Encounter (Signed)
[facility] does not fill this Rx they have a policy against this  RX needs to be sent to [facility] per patient preference

## 2022-08-03 ENCOUNTER — Other Ambulatory Visit: Payer: Self-pay | Admitting: Family Medicine

## 2022-08-03 DIAGNOSIS — I5032 Chronic diastolic (congestive) heart failure: Secondary | ICD-10-CM

## 2022-08-03 DIAGNOSIS — E785 Hyperlipidemia, unspecified: Secondary | ICD-10-CM

## 2022-08-03 DIAGNOSIS — Z951 Presence of aortocoronary bypass graft: Secondary | ICD-10-CM

## 2022-08-03 DIAGNOSIS — I10 Essential (primary) hypertension: Secondary | ICD-10-CM

## 2022-08-11 DIAGNOSIS — N183 Chronic kidney disease, stage 3 unspecified: Secondary | ICD-10-CM | POA: Diagnosis not present

## 2022-08-20 DIAGNOSIS — I129 Hypertensive chronic kidney disease with stage 1 through stage 4 chronic kidney disease, or unspecified chronic kidney disease: Secondary | ICD-10-CM | POA: Diagnosis not present

## 2022-08-20 DIAGNOSIS — R809 Proteinuria, unspecified: Secondary | ICD-10-CM | POA: Diagnosis not present

## 2022-08-20 DIAGNOSIS — N183 Chronic kidney disease, stage 3 unspecified: Secondary | ICD-10-CM | POA: Diagnosis not present

## 2022-09-02 DIAGNOSIS — N183 Chronic kidney disease, stage 3 unspecified: Secondary | ICD-10-CM | POA: Diagnosis not present

## 2022-09-21 ENCOUNTER — Encounter: Payer: Self-pay | Admitting: Family Medicine

## 2022-10-26 ENCOUNTER — Other Ambulatory Visit: Payer: Self-pay | Admitting: Family Medicine

## 2022-10-26 DIAGNOSIS — E1122 Type 2 diabetes mellitus with diabetic chronic kidney disease: Secondary | ICD-10-CM

## 2022-11-04 ENCOUNTER — Other Ambulatory Visit: Payer: Self-pay | Admitting: Family Medicine

## 2022-11-04 DIAGNOSIS — I10 Essential (primary) hypertension: Secondary | ICD-10-CM

## 2023-01-06 ENCOUNTER — Ambulatory Visit (INDEPENDENT_AMBULATORY_CARE_PROVIDER_SITE_OTHER): Payer: Medicare Other | Admitting: *Deleted

## 2023-01-06 DIAGNOSIS — Z0000 Encounter for general adult medical examination without abnormal findings: Secondary | ICD-10-CM

## 2023-01-06 NOTE — Patient Instructions (Signed)
Antonio Wells , Thank you for taking time to come for your Medicare Wellness Visit. I appreciate your ongoing commitment to your health goals. Please review the following plan we discussed and let me know if I can assist you in the future.   Screening recommendations/referrals: Colonoscopy: up to date Recommended yearly ophthalmology/optometry visit for glaucoma screening and checkup Recommended yearly dental visit for hygiene and checkup  Vaccinations: Influenza vaccine: up to date Pneumococcal vaccine: education provided Tdap vaccine: Education provided Shingles   Education provided    Advanced directives: Education provided   Preventive Care 65 Years and Older, Male Preventive care refers to lifestyle choices and visits with your health care provider that can promote health and wellness. What does preventive care include? A yearly physical exam. This is also called an annual well check. Dental exams once or twice a year. Routine eye exams. Ask your health care provider how often you should have your eyes checked. Personal lifestyle choices, including: Daily care of your teeth and gums. Regular physical activity. Eating a healthy diet. Avoiding tobacco and drug use. Limiting alcohol use. Practicing safe sex. Taking low doses of aspirin every day. Taking vitamin and mineral supplements as recommended by your health care provider. What happens during an annual well check? The services and screenings done by your health care provider during your annual well check will depend on your age, overall health, lifestyle risk factors, and family history of disease. Counseling  Your health care provider may ask you questions about your: Alcohol use. Tobacco use. Drug use. Emotional well-being. Home and relationship well-being. Sexual activity. Eating habits. History of falls. Memory and ability to understand (cognition). Work and work Astronomer. Screening  You may have the  following tests or measurements: Height, weight, and BMI. Blood pressure. Lipid and cholesterol levels. These may be checked every 5 years, or more frequently if you are over 42 years old. Skin check. Lung cancer screening. You may have this screening every year starting at age 71 if you have a 30-pack-year history of smoking and currently smoke or have quit within the past 15 years. Fecal occult blood test (FOBT) of the stool. You may have this test every year starting at age 40. Flexible sigmoidoscopy or colonoscopy. You may have a sigmoidoscopy every 5 years or a colonoscopy every 10 years starting at age 3. Prostate cancer screening. Recommendations will vary depending on your family history and other risks. Hepatitis C blood test. Hepatitis B blood test. Sexually transmitted disease (STD) testing. Diabetes screening. This is done by checking your blood sugar (glucose) after you have not eaten for a while (fasting). You may have this done every 1-3 years. Abdominal aortic aneurysm (AAA) screening. You may need this if you are a current or former smoker. Osteoporosis. You may be screened starting at age 48 if you are at high risk. Talk with your health care provider about your test results, treatment options, and if necessary, the need for more tests. Vaccines  Your health care provider may recommend certain vaccines, such as: Influenza vaccine. This is recommended every year. Tetanus, diphtheria, and acellular pertussis (Tdap, Td) vaccine. You may need a Td booster every 10 years. Zoster vaccine. You may need this after age 55. Pneumococcal 13-valent conjugate (PCV13) vaccine. One dose is recommended after age 3. Pneumococcal polysaccharide (PPSV23) vaccine. One dose is recommended after age 29. Talk to your health care provider about which screenings and vaccines you need and how often you need them. This information is  not intended to replace advice given to you by your health care  provider. Make sure you discuss any questions you have with your health care provider. Document Released: 08/08/2015 Document Revised: 03/31/2016 Document Reviewed: 05/13/2015 Elsevier Interactive Patient Education  2017 [employer].  Fall Prevention in the Home Falls can cause injuries. They can happen to people of all ages. There are many things you can do to make your home safe and to help prevent falls. What can I do on the outside of my home? Regularly fix the edges of walkways and driveways and fix any cracks. Remove anything that might make you trip as you walk through a door, such as a raised step or threshold. Trim any bushes or trees on the path to your home. Use bright outdoor lighting. Clear any walking paths of anything that might make someone trip, such as rocks or tools. Regularly check to see if handrails are loose or broken. Make sure that both sides of any steps have handrails. Any raised decks and porches should have guardrails on the edges. Have any leaves, snow, or ice cleared regularly. Use sand or salt on walking paths during winter. Clean up any spills in your garage right away. This includes oil or grease spills. What can I do in the bathroom? Use night lights. Install grab bars by the toilet and in the tub and shower. Do not use towel bars as grab bars. Use non-skid mats or decals in the tub or shower. If you need to sit down in the shower, use a plastic, non-slip stool. Keep the floor dry. Clean up any water that spills on the floor as soon as it happens. Remove soap buildup in the tub or shower regularly. Attach bath mats securely with double-sided non-slip rug tape. Do not have throw rugs and other things on the floor that can make you trip. What can I do in the bedroom? Use night lights. Make sure that you have a light by your bed that is easy to reach. Do not use any sheets or blankets that are too big for your bed. They should not hang down onto the  floor. Have a firm chair that has side arms. You can use this for support while you get dressed. Do not have throw rugs and other things on the floor that can make you trip. What can I do in the kitchen? Clean up any spills right away. Avoid walking on wet floors. Keep items that you use a lot in easy-to-reach places. If you need to reach something above you, use a strong step stool that has a grab bar. Keep electrical cords out of the way. Do not use floor polish or wax that makes floors slippery. If you must use wax, use non-skid floor wax. Do not have throw rugs and other things on the floor that can make you trip. What can I do with my stairs? Do not leave any items on the stairs. Make sure that there are handrails on both sides of the stairs and use them. Fix handrails that are broken or loose. Make sure that handrails are as long as the stairways. Check any carpeting to make sure that it is firmly attached to the stairs. Fix any carpet that is loose or worn. Avoid having throw rugs at the top or bottom of the stairs. If you do have throw rugs, attach them to the floor with carpet tape. Make sure that you have a light switch at the top of the  stairs and the bottom of the stairs. If you do not have them, ask someone to add them for you. What else can I do to help prevent falls? Wear shoes that: Do not have high heels. Have rubber bottoms. Are comfortable and fit you well. Are closed at the toe. Do not wear sandals. If you use a stepladder: Make sure that it is fully opened. Do not climb a closed stepladder. Make sure that both sides of the stepladder are locked into place. Ask someone to hold it for you, if possible. Clearly mark and make sure that you can see: Any grab bars or handrails. First and last steps. Where the edge of each step is. Use tools that help you move around (mobility aids) if they are needed. These include: Canes. Walkers. Scooters. Crutches. Turn on the  lights when you go into a dark area. Replace any light bulbs as soon as they burn out. Set up your furniture so you have a clear path. Avoid moving your furniture around. If any of your floors are uneven, fix them. If there are any pets around you, be aware of where they are. Review your medicines with your doctor. Some medicines can make you feel dizzy. This can increase your chance of falling. Ask your doctor what other things that you can do to help prevent falls. This information is not intended to replace advice given to you by your health care provider. Make sure you discuss any questions you have with your health care provider. Document Released: 05/08/2009 Document Revised: 12/18/2015 Document Reviewed: 08/16/2014 Elsevier Interactive Patient Education  2017 [employer].

## 2023-01-06 NOTE — Progress Notes (Signed)
Subjective:   Antonio Wells is a 77 y.o. male who presents for Medicare Annual/Subsequent preventive examination.  I connected with  Danielle Rankin on 01/06/23 by a telephone enabled telemedicine application and verified that I am speaking with the correct person using two identifiers.   I discussed the limitations of evaluation and management by telemedicine. The patient expressed understanding and agreed to proceed.  Patient location: home  Provider location: telephone/home  Confirmed with patient all questions answered previously are correct    Review of Systems     Cardiac Risk Factors include: diabetes mellitus;male gender;advanced age (>24men, >34 women);hypertension     Objective:    Today's Vitals   There is no height or weight on file to calculate BMI.     01/06/2023    1:37 PM 06/27/2020   11:03 AM 08/27/2019   11:08 AM 10/20/2018    6:00 AM 10/19/2018    2:45 PM 07/11/2017    9:07 PM 07/01/2017   12:21 PM  Advanced Directives  Does Patient Have a Medical Advance Directive? No No No No No No No  Would patient like information on creating a medical advance directive? No - Patient declined No - Patient declined Yes (Inpatient - patient defers creating a medical advance directive at this time - Information given) No - Patient declined  No - Patient declined     Current Medications (verified) Outpatient Encounter Medications as of 01/06/2023  Medication Sig   acetaminophen (TYLENOL) 325 MG tablet Take 650 mg by mouth every 6 (six) hours as needed for mild pain.   aspirin EC 81 MG tablet Take 1 tablet (81 mg total) by mouth daily.   atorvastatin (LIPITOR) 80 MG tablet TAKE 1 TABLET BY MOUTH DAILY AT  6 PM   benzonatate (TESSALON) 100 MG capsule Take 1 capsule (100 mg total) by mouth 3 (three) times daily as needed for cough.   chlorthalidone (HYGROTON) 25 MG tablet Take 0.5 tablets (12.5 mg total) by mouth every morning.   colchicine 0.6 MG tablet TAKE 2 TABS  ON ONSET OF GOUT FLARE THEN REPEAT IN 1 HOUR OF ACUTE GOUT ATTACK   glucose blood test strip Use as instructed   guaiFENesin (MUCINEX) 600 MG 12 hr tablet Take 600 mg by mouth 2 (two) times daily as needed (congestion, cough).   guaiFENesin-codeine (CHERATUSSIN AC) 100-10 MG/5ML syrup Take 5 mLs by mouth at bedtime as needed for cough.   hydrALAZINE (APRESOLINE) 50 MG tablet TAKE 1 TABLET BY MOUTH IN THE  MORNING AND 2 TABLETS BY MOUTH  IN THE EVENING   irbesartan (AVAPRO) 300 MG tablet TAKE 1 TABLET BY MOUTH DAILY   isosorbide mononitrate (IMDUR) 60 MG 24 hr tablet TAKE 1 TABLET BY MOUTH AT  BEDTIME   JANUVIA 50 MG tablet TAKE 1 TABLET BY MOUTH DAILY   metFORMIN (GLUCOPHAGE) 500 MG tablet Take 1 tablet (500 mg total) by mouth 2 (two) times daily with a meal.   Lancets (ONETOUCH ULTRASOFT) lancets 1 each by Other route 1 day or 1 dose for 1 dose. Use as instructed   No facility-administered encounter medications on file as of 01/06/2023.    Allergies (verified) Penicillins, Lisinopril, Amiodarone, Amlodipine, Beta adrenergic blockers, Oxycodone, Zofran [ondansetron hcl], Cortisone, and Tramadol   History: Past Medical History:  Diagnosis Date   Arthritis    "knees" (07/11/2017)   CKD (chronic kidney disease), stage III (HCC)    lov dr Alycia Rossetti [place] 04-23-2020 on chart   Complication  of anesthesia    post op afibe after cabg x 2 2018   Coronary artery disease    a.  CABGx3 in 05/2017.   Gout    Heart murmur    mild, saw dr Ladona Ridgel 03-26-2020 f/u prn   High cholesterol    Hypertension    Mild aortic stenosis    Mild mitral regurgitation    OSA on CPAP    last used cpap 3 weeks ago   Phimosis    Postoperative atrial fibrillation (HCC) 06/2017   Sleep apnea    uses CPAP   Type II diabetes mellitus [facility])    Past Surgical History:  Procedure Laterality Date   CARDIAC CATHETERIZATION     CIRCUMCISION N/A 06/27/2020   Procedure: CIRCUMCISION ADULT;  Surgeon: Rene Paci,  MD;  [facility];  Service: Urology;  Laterality: N/A;   COLONOSCOPY  2008   CORONARY ARTERY BYPASS GRAFT N/A 06/24/2017   Procedure: CORONARY ARTERY BYPASS GRAFTING (CABG) x 3 ; Using Left Internal Mammary Artery and Right Great Saphenous Leg Vein Harvested Endoscopically;  Surgeon: Alleen Borne, MD;  Location: [facility] OR;  Service: Open Heart Surgery;  Laterality: N/A;   LEFT HEART CATH AND CORONARY ANGIOGRAPHY N/A 05/25/2017   Procedure: LEFT HEART CATH AND CORONARY ANGIOGRAPHY;  Surgeon: Iran Ouch, MD;  Location: [facility] INVASIVE CV LAB;  Service: Cardiovascular;  Laterality: N/A;   TEE WITHOUT CARDIOVERSION N/A 06/24/2017   Procedure: TRANSESOPHAGEAL ECHOCARDIOGRAM (TEE);  Surgeon: Alleen Borne, MD;  Location: [facility] OR;  Service: Open Heart Surgery;  Laterality: N/A;   Family History  Problem Relation Age of Onset   Hypertension Mother    Kidney disease Mother    Diabetes Mother    Hypertension Father    Cancer Brother        lung   Colon cancer Brother 36   Stroke Brother    Esophageal cancer Neg Hx    Rectal cancer Neg Hx    Stomach cancer Neg Hx    Colon polyps Neg Hx    Social History   Socioeconomic History   Marital status: Married    Spouse name: Bonita Quin   Number of children: 3   Years of education: 16   Highest education level: Not on file  Occupational History   Occupation: Musician  Tobacco Use   Smoking status: Former    Packs/day: 2.00    Years: 15.00    Additional pack years: 0.00    Total pack years: 30.00    Types: Cigarettes    Quit date: 07/26/1998    Years since quitting: 24.4   Smokeless tobacco: Never  Vaping Use   Vaping Use: Never used  Substance and Sexual Activity   Alcohol use: Not Currently    Alcohol/week: 0.0 standard drinks of alcohol   Drug use: No   Sexual activity: Yes  Other Topics Concern   Not on file  Social History Narrative         Social Determinants of Health   Financial Resource Strain: Low  Risk  (01/06/2023)   Overall Financial Resource Strain (CARDIA)    Difficulty of Paying Living Expenses: Not hard at all  Food Insecurity: No Food Insecurity (01/06/2023)   Hunger Vital Sign    Worried About Running Out of Food in the Last Year: Never true    Ran Out of Food in the Last Year: Never true  Transportation Needs: No Transportation Needs (01/06/2023)   PRAPARE -  Administrator, Civil Service (Medical): No    Lack of Transportation (Non-Medical): No  Physical Activity: Inactive (01/06/2023)   Exercise Vital Sign    Days of Exercise per Week: 0 days    Minutes of Exercise per Session: 0 min  Stress: No Stress Concern Present (01/06/2023)   [employer] of Occupational Health - Occupational Stress Questionnaire    Feeling of Stress : Not at all  Social Connections: Moderately Integrated (01/06/2023)   Social Connection and Isolation Panel [NHANES]    Frequency of Communication with Friends and Family: More than three times a week    Frequency of Social Gatherings with Friends and Family: Three times a week    Attends Religious Services: Never    Active Member of Clubs or Organizations: Yes    Attends Engineer, structural: More than 4 times per year    Marital Status: Married    Tobacco Counseling Counseling given: Not Answered   Clinical Intake:  Pre-visit preparation completed: Yes  Pain : No/denies pain     Diabetes: Yes CBG done?: No Did pt. bring in CBG monitor from home?: No  How often do you need to have someone help you when you read instructions, pamphlets, or other written materials from your doctor or pharmacy?: 1 - Never  Diabetic?   Yes  Nutrition Risk Assessment:  Has the patient had any N/V/D within the last 2 months?  No  Does the patient have any non-healing wounds?  No  Has the patient had any unintentional weight loss or weight gain?  No   Diabetes:  Is the patient diabetic?  Yes  If diabetic, was a CBG obtained  today?  No  Did the patient bring in their glucometer from home?  No  How often do you monitor your CBG's? Every other day.   Financial Strains and Diabetes Management:  Are you having any financial strains with the device, your supplies or your medication? No .  Does the patient want to be seen by Chronic Care Management for management of their diabetes?  No  Would the patient like to be referred to a Nutritionist or for Diabetic Management?  No   Diabetic Exams:  Diabetic Eye Exam: Completed .Marland Kitchen Pt has been advised about the importance in completing this exam. A referral has been placed today. Message sent to referral coordinator for scheduling purposes. Advised pt to expect a call from office referred to regarding appt.  Diabetic Foot Exam: Pt has been advised about the importance in completing this exam.   Interpreter Needed?: No  Information entered by :: Remi Haggard LPN   Activities of Daily Living    01/06/2023    1:38 PM 01/03/2023    3:58 PM  In your present state of health, do you have any difficulty performing the following activities:  Hearing? 0 0  Vision? 0 0  Difficulty concentrating or making decisions? 0 0  Walking or climbing stairs? 0 0  Dressing or bathing? 0 0  Doing errands, shopping? 0 0  Preparing Food and eating ? N N  Using the Toilet? N N  In the past six months, have you accidently leaked urine? N N  Do you have problems with loss of bowel control? N N  Managing your Medications? N N  Managing your Finances? N N  Housekeeping or managing your Housekeeping?  N    Patient Care Team: Shade Flood, MD as PCP - General (Family Medicine) Hurricane,  Veverly Fells, MD as PCP - Cardiology (Cardiology) Lars Masson, MD as Consulting Physician (Cardiology)  Indicate any recent Medical Services you may have received from other than [facility] providers in the past year (date may be approximate).     Assessment:   This is a routine wellness examination for  [place].  Hearing/Vision screen Hearing Screening - Comments:: No trouble hearing Vision Screening - Comments:: [facility]     Up to date   Dietary issues and exercise activities discussed: Current Exercise Habits: The patient does not participate in regular exercise at present   Goals Addressed             This Visit's Progress    Patient Stated       Motor home trip take       Depression Screen    01/06/2023    1:42 PM 07/15/2022   11:45 AM 05/05/2022    9:43 AM 02/18/2022   11:20 AM 02/01/2022   10:00 AM 10/21/2021   10:09 AM 08/10/2021    9:07 AM  PHQ 2/9 Scores  PHQ - 2 Score 0 0 0 0 0 0 0  PHQ- 9 Score 0 0 1        Fall Risk    01/06/2023    1:38 PM 01/03/2023    3:58 PM 07/15/2022   11:45 AM 05/05/2022    9:44 AM 02/15/2022   10:56 AM  Fall Risk   Falls in the past year? 0 0 0 0 0  Number falls in past yr: 0 0 0 0   Injury with Fall? 0 0 0 0 0  Risk for fall due to :   No Fall Risks No Fall Risks   Follow up Falls evaluation completed;Education provided;Falls prevention discussed  Falls evaluation completed Falls evaluation completed     FALL RISK PREVENTION PERTAINING TO THE HOME:  Any stairs in or around the home? Yes  If so, are there any without handrails? No  Home free of loose throw rugs in walkways, pet beds, electrical cords, etc? Yes  Adequate lighting in your home to reduce risk of falls? Yes   ASSISTIVE DEVICES UTILIZED TO PREVENT FALLS:  Life alert? No  Use of a cane, walker or w/c? No  Grab bars in the bathroom? No  Shower chair or bench in shower? No  Elevated toilet seat or a handicapped toilet? Yes   TIMED UP AND GO:  Was the test performed? No .    Cognitive Function:        01/06/2023    1:39 PM 02/18/2022   11:17 AM 08/27/2019   11:05 AM 03/29/2017   10:49 AM  6CIT Screen  What Year? 0 points 0 points 0 points 0 points  What month? 0 points 0 points 0 points 0 points  What time? 0 points 0 points 0 points 0 points   Count back from 20 0 points 0 points 0 points 0 points  Months in reverse 0 points 0 points 0 points 0 points  Repeat phrase 0 points 0 points 0 points 0 points  Total Score 0 points 0 points 0 points 0 points    Immunizations Immunization History  Administered Date(s) Administered   Fluad Quad(high Dose 65+) 04/07/2022   Influenza, High Dose Seasonal PF 03/20/2019   Influenza, Seasonal, Injecte, Preservative Fre 07/03/2012   Influenza,inj,Quad PF,6+ Mos 08/25/2015, 05/12/2017, 05/29/2018   Influenza-Unspecified 07/24/2016, 04/23/2020, 05/05/2021   PFIZER(Purple Top)SARS-COV-2 Vaccination 08/31/2019, 09/21/2019, 04/02/2020, 12/04/2020,  04/16/2021   Pneumococcal Polysaccharide-23 07/03/2012   Tdap 07/03/2012    TDAP status: Due, Education has been provided regarding the importance of this vaccine. Advised may receive this vaccine at local pharmacy or Health Dept. Aware to provide a copy of the vaccination record if obtained from local pharmacy or Health Dept. Verbalized acceptance and understanding.  Flu Vaccine status: Up to date  Pneumococcal vaccine status: Declined,  Education has been provided regarding the importance of this vaccine but patient still declined. Advised may receive this vaccine at local pharmacy or Health Dept. Aware to provide a copy of the vaccination record if obtained from local pharmacy or Health Dept. Verbalized acceptance and understanding.   Covid-19 vaccine status: Declined, Education has been provided regarding the importance of this vaccine but patient still declined. Advised may receive this vaccine at local pharmacy or Health Dept.or vaccine clinic. Aware to provide a copy of the vaccination record if obtained from local pharmacy or Health Dept. Verbalized acceptance and understanding.  Qualifies for Shingles Vaccine? Yes   Zostavax completed No   Shingrix Completed?: No.    Education has been provided regarding the importance of this vaccine. Patient  has been advised to call insurance company to determine out of pocket expense if they have not yet received this vaccine. Advised may also receive vaccine at local pharmacy or Health Dept. Verbalized acceptance and understanding.  Screening Tests Health Maintenance  Topic Date Due   Diabetic kidney evaluation - Urine ACR  10/22/2022   HEMOGLOBIN A1C  11/04/2022   Zoster Vaccines- Shingrix (1 of 2) 04/08/2023 (Originally 01/12/1997)   Pneumonia Vaccine 76+ Years old (2 of 2 - PCV) 07/16/2023 (Originally 07/03/2013)   FOOT EXAM  02/02/2023   INFLUENZA VACCINE  02/24/2023   OPHTHALMOLOGY EXAM  03/11/2023   Diabetic kidney evaluation - eGFR measurement  05/06/2023   Colonoscopy  12/17/2023   Medicare Annual Wellness (AWV)  01/06/2024   COVID-19 Vaccine  Completed   Hepatitis C Screening  Completed   HPV VACCINES  Aged Out   DTaP/Tdap/Td  Discontinued    Health Maintenance  Health Maintenance Due  Topic Date Due   Diabetic kidney evaluation - Urine ACR  10/22/2022   HEMOGLOBIN A1C  11/04/2022    Colorectal cancer screening: Type of screening: Colonoscopy. Completed 2022. Repeat every 3 years  Lung Cancer Screening: (Low Dose CT Chest recommended if Age 26-80 years, 30 pack-year currently smoking OR have quit w/in 15years.) does not qualify.   Lung Cancer Screening Referral:   Additional Screening:  Hepatitis C Screening: does not qualify; Completed 2017  Vision Screening: Recommended annual ophthalmology exams for early detection of glaucoma and other disorders of the eye. Is the patient up to date with their annual eye exam?  Yes  Who is the provider or what is the name of the office in which the patient attends annual eye exams? [facility] If pt is not established with a provider, would they like to be referred to a provider to establish care? No .   Dental Screening: Recommended annual dental exams for proper oral hygiene  Community Resource Referral / Chronic Care  Management: CRR required this visit?  No   CCM required this visit?  No      Plan:     I have personally reviewed and noted the following in the patient's chart:   Medical and social history Use of alcohol, tobacco or illicit drugs  Current medications and supplements including opioid prescriptions. Patient  is not currently taking opioid prescriptions. Functional ability and status Nutritional status Physical activity Advanced directives List of other physicians Hospitalizations, surgeries, and ER visits in previous 12 months Vitals Screenings to include cognitive, depression, and falls Referrals and appointments  In addition, I have reviewed and discussed with patient certain preventive protocols, quality metrics, and best practice recommendations. A written personalized care plan for preventive services as well as general preventive health recommendations were provided to patient.     Remi Haggard, LPN   [id number]   Nurse Notes:

## 2023-01-11 LAB — HM DIABETES EYE EXAM

## 2023-03-10 ENCOUNTER — Other Ambulatory Visit: Payer: Self-pay | Admitting: Family Medicine

## 2023-03-10 ENCOUNTER — Other Ambulatory Visit: Payer: Self-pay | Admitting: Internal Medicine

## 2023-03-10 DIAGNOSIS — E1122 Type 2 diabetes mellitus with diabetic chronic kidney disease: Secondary | ICD-10-CM

## 2023-05-04 ENCOUNTER — Ambulatory Visit: Payer: Medicare Other | Attending: Cardiology | Admitting: Cardiology

## 2023-05-04 ENCOUNTER — Encounter: Payer: Self-pay | Admitting: Cardiology

## 2023-05-04 VITALS — BP 136/88 | HR 83 | Ht 63.0 in | Wt 184.2 lb

## 2023-05-04 DIAGNOSIS — Z951 Presence of aortocoronary bypass graft: Secondary | ICD-10-CM | POA: Diagnosis not present

## 2023-05-04 DIAGNOSIS — I119 Hypertensive heart disease without heart failure: Secondary | ICD-10-CM

## 2023-05-04 DIAGNOSIS — I10 Essential (primary) hypertension: Secondary | ICD-10-CM

## 2023-05-04 DIAGNOSIS — I5032 Chronic diastolic (congestive) heart failure: Secondary | ICD-10-CM

## 2023-05-04 DIAGNOSIS — E785 Hyperlipidemia, unspecified: Secondary | ICD-10-CM | POA: Diagnosis not present

## 2023-05-04 DIAGNOSIS — E1122 Type 2 diabetes mellitus with diabetic chronic kidney disease: Secondary | ICD-10-CM | POA: Diagnosis not present

## 2023-05-04 MED ORDER — ISOSORBIDE MONONITRATE ER 60 MG PO TB24: 60.0000 mg | ORAL_TABLET | Freq: Every day | ORAL | 3 refills | Status: DC

## 2023-05-04 MED ORDER — HYDRALAZINE HCL 50 MG PO TABS: ORAL_TABLET | ORAL | 11 refills | Status: DC

## 2023-05-04 MED ORDER — IRBESARTAN 300 MG PO TABS: 300.0000 mg | ORAL_TABLET | Freq: Every day | ORAL | 3 refills | Status: DC

## 2023-05-04 MED ORDER — METFORMIN HCL 500 MG PO TABS: 500.0000 mg | ORAL_TABLET | Freq: Two times a day (BID) | ORAL | 3 refills | Status: DC

## 2023-05-04 NOTE — Progress Notes (Signed)
Cardiology Office Note:  .   Date:  05/04/2023  ID:  Antonio Wells, DOB [birth year], MRN [id number] PCP: Shade Flood, MD  [facility] Providers Cardiologist:  Donato Schultz, MD     History of Present Illness: .   Antonio Wells is a 77 y.o. male Discussed the with use of AI scribe software   History of Present Illness   The patient is a 77 year old male with a history of coronary artery disease, status post CABG in 2018, and hypertension. 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 The patient had an echocardiogram on 02/16/20 which showed a normal ejection fraction and mild aortic valve stenosis. A repeat echocardiogram on 04/22/22 confirmed these findings. The patient had prior episodes of high degree, second-degree heart block which resolved after stopping beta blocker. He is currently on hydralazine, irbesartan, and isosorbide for hypertension.  The patient reports minor shortness of breath with activity, which resolves upon rest. He is unsure of the triggers for this symptom. The patient had a history of more severe fatigue and breathlessness prior to his bypass surgery. He is currently managing well without the beta blocker, with a heart rate of 83 on the day of the consultation. The patient continues to be active, playing the saxophone in a band.          Studies Reviewed: Marland Kitchen   EKG Interpretation Date/Time:  Wednesday May 04 2023 09:10:04 EDT Ventricular Rate:  83 PR Interval:  142 QRS Duration:  82 QT Interval:  360 QTC Calculation: 423 R Axis:   -7  Text Interpretation: Normal sinus rhythm Minimal voltage criteria for LVH, may be normal variant ( R in aVL ) Inferior infarct , age undetermined When compared with ECG of 02-Aug-2021 10:44, No significant change since last tracing Confirmed by Donato Schultz ([id number]) on 05/04/2023 9:30:33 AM    Results DIAGNOSTIC Echocardiogram: Mild aortic valve stenosis with normal ejection fraction  (04/22/2022)  Risk Assessment/Calculations:           Physical Exam:   VS:  BP 136/88   Pulse 83   Ht 5\' 3"  (1.6 m)   Wt 184 lb 3.2 oz (83.6 kg)   SpO2 96%   BMI 32.63 kg/m    Wt Readings from Last 3 Encounters:  05/04/23 184 lb 3.2 oz (83.6 kg)  07/15/22 185 lb 6.4 oz (84.1 kg)  05/05/22 181 lb (82.1 kg)    GEN: Well nourished, well developed in no acute distress NECK: No JVD; No carotid bruits CARDIAC: RRR, no murmurs, no rubs, no gallops RESPIRATORY:  Clear to auscultation without rales, wheezing or rhonchi  ABDOMEN: Soft, non-tender, non-distended EXTREMITIES:  No edema; No deformity   ASSESSMENT AND PLAN: .    Assessment and Plan    Coronary Artery Disease (CAD), status post Coronary Artery Bypass Graft (CABG) in 2018 No chest pain or pressure. Mild exertional dyspnea, likely due to deconditioning. Echocardiogram shows normal pump function and mild aortic valve stenosis. -Continue current management. -Consider stress test if symptoms change or worsen.  Hypertension Managed with hydralazine, irbesartan, and isosorbide. -Continue current medications.  Bradycardia - Resolved after stopping beta-blocker.  Prior EP evaluation with Dr. Ladona Ridgel.  Follow-up in 1 year. If any changes or concerns arise, patient is advised to contact the office.               Signed, Donato Schultz, MD

## 2023-05-04 NOTE — Patient Instructions (Signed)
Medication Instructions:   *If you need a refill on your cardiac medications before your next appointment, please call your pharmacy*   Lab Work:  If you have labs (blood work) drawn today and your tests are completely normal, you will receive your results only by: MyChart Message (if you have MyChart) OR A paper copy in the mail If you have any lab test that is abnormal or we need to change your treatment, we will call you to review the results.   Testing/Procedures:    Follow-Up: At [facility], you and your health needs are our priority.  As part of our continuing mission to provide you with exceptional heart care, we have created designated Provider Care Teams.  These Care Teams include your primary Cardiologist (physician) and Advanced Practice Providers (APPs -  Physician Assistants and Nurse Practitioners) who all work together to provide you with the care you need, when you need it.  We recommend signing up for the patient portal called "MyChart".  Sign up information is provided on this After Visit Summary.  MyChart is used to connect with patients for Virtual Visits (Telemedicine).  Patients are able to view lab/test results, encounter notes, upcoming appointments, etc.  Non-urgent messages can be sent to your provider as well.   To learn more about what you can do with MyChart, go to [email or web address].    Your next appointment:   1 year(s)  Provider:   Donato Schultz, MD     Other Instructions

## 2023-06-01 ENCOUNTER — Other Ambulatory Visit: Payer: Self-pay | Admitting: Family Medicine

## 2023-06-01 DIAGNOSIS — I10 Essential (primary) hypertension: Secondary | ICD-10-CM

## 2023-06-01 DIAGNOSIS — I2510 Atherosclerotic heart disease of native coronary artery without angina pectoris: Secondary | ICD-10-CM

## 2023-08-02 ENCOUNTER — Telehealth: Payer: Self-pay

## 2023-08-02 ENCOUNTER — Other Ambulatory Visit: Payer: Self-pay

## 2023-08-02 DIAGNOSIS — I5032 Chronic diastolic (congestive) heart failure: Secondary | ICD-10-CM

## 2023-08-02 DIAGNOSIS — I10 Essential (primary) hypertension: Secondary | ICD-10-CM

## 2023-08-02 DIAGNOSIS — E785 Hyperlipidemia, unspecified: Secondary | ICD-10-CM

## 2023-08-02 DIAGNOSIS — E1122 Type 2 diabetes mellitus with diabetic chronic kidney disease: Secondary | ICD-10-CM

## 2023-08-02 DIAGNOSIS — Z951 Presence of aortocoronary bypass graft: Secondary | ICD-10-CM

## 2023-08-02 MED ORDER — HYDRALAZINE HCL 50 MG PO TABS: ORAL_TABLET | ORAL | 11 refills | Status: DC

## 2023-08-02 MED ORDER — SITAGLIPTIN PHOSPHATE 50 MG PO TABS: 50.0000 mg | ORAL_TABLET | Freq: Every day | ORAL | 2 refills | Status: DC

## 2023-08-02 NOTE — Telephone Encounter (Signed)
 Januvia refilled.  Recent visit with cardiology noted in October.  Continue current regimen at that time.  I will refill hydralazine.

## 2023-08-02 NOTE — Telephone Encounter (Signed)
 Copied from CRM [id number]. Topic: Clinical - Medication Refill >> Aug 01, 2023  2:56 PM Leila C wrote: Most Recent Primary Care Visit:  Provider: GREER, JULIE M  Department: [facility]  Visit Type: MEDICARE AWV, SEQUENTIAL  Date: 01/06/2023  Medication: hydrALAZINE  (APRESOLINE ) 50 MG tablet and JANUVIA  50 MG tablet  Has the patient contacted their pharmacy? Yes. Patient changed insurance and now is using [facility], please use this pharmacy for 90 days supply. (Agent: If no, request that the patient contact the pharmacy for the refill. If patient does not wish to contact the pharmacy document the reason why and proceed with request.) (Agent: If yes, when and what did the pharmacy advise?)  Is this the correct pharmacy for this prescription? Yes If no, delete pharmacy and type the correct one.  This is the patient's preferred pharmacy:  [facility] - [place], [place] - [address] [place] [address] Phone: [phone] Fax: [phone]   Has the prescription been filled recently?   Is the patient out of the medication? No, patient will be out of medications soon.  Has the patient been seen for an appointment in the last year OR does the patient have an upcoming appointment? Yes  Can we respond through MyChart? Yes  Agent: Please be advised that Rx refills may take up to 3 business days. We ask that you follow-up with your pharmacy.

## 2023-08-02 NOTE — Addendum Note (Signed)
 Addended by: Meredith Staggers R on: 08/02/2023 09:01 AM   Modules accepted: Orders

## 2023-08-02 NOTE — Telephone Encounter (Signed)
 Pt has been notified.

## 2023-08-22 ENCOUNTER — Ambulatory Visit: Payer: Medicare (Managed Care) | Admitting: Family Medicine

## 2023-08-22 ENCOUNTER — Encounter: Payer: Self-pay | Admitting: Family Medicine

## 2023-08-22 VITALS — BP 128/70 | HR 95 | Temp 98.6°F | Ht 62.25 in | Wt 177.6 lb

## 2023-08-22 DIAGNOSIS — I10 Essential (primary) hypertension: Secondary | ICD-10-CM | POA: Diagnosis not present

## 2023-08-22 DIAGNOSIS — E785 Hyperlipidemia, unspecified: Secondary | ICD-10-CM

## 2023-08-22 DIAGNOSIS — E1122 Type 2 diabetes mellitus with diabetic chronic kidney disease: Secondary | ICD-10-CM | POA: Diagnosis not present

## 2023-08-22 DIAGNOSIS — Z0000 Encounter for general adult medical examination without abnormal findings: Secondary | ICD-10-CM | POA: Diagnosis not present

## 2023-08-22 DIAGNOSIS — R252 Cramp and spasm: Secondary | ICD-10-CM | POA: Diagnosis not present

## 2023-08-22 NOTE — Patient Instructions (Addendum)
I do recommend using CPAP for sleep apnea. There are some possible risks with untreated sleep apnea. Keep follow up with specialists as planned.  I will check labs including diabetes and gout tests to see if changes needed. No changes needed at this time. If no concerns on labs.  You are due for repeat colonoscopy in May. Please call to schedule. Phone: [phone] Thanks for coming in today and take care.  There is a recommendation for RSV vaccine for patients over age 77.   That is an option at your pharmacy. Typically would recommend the RSV vaccine as most important for patients over age 77 that have comorbidities that put them at increased risk for severe disease (heart disease such as congestive heart failure, coronary artery disease, lung disease such as asthma or COPD, kidney disease, liver disease, diabetes, chronic or progressive neurologic or muscular conditions, immunosuppressed, or being frail or of advanced age).  For others that do not have these risk factors, there still is some benefit from vaccination since age is one of the main risk factors for developing severe disease however baseline risk of developing severe disease and requiring hospitalization is likely to be lower compared to those that have comorbidities in addition to age.   CDC does have some information as well: [employer]

## 2023-08-22 NOTE — Progress Notes (Unsigned)
Subjective:  Patient ID: HEZZIE KARIM, male    DOB: 1945-10-07  Age: 78 y.o. MRN: [id number]  CC:  Chief Complaint  Patient presents with  . Annual Exam    Pt is doing well, pt notes will get hand cramps at times mostly in the soft tissue near the thumb joint of either hand, pt is fasting     HPI KAI CALICO presents for Annual Exam And follow-up of chronic conditions Annual wellness visit 01/06/2023   PCP, me Cardiology, Dr. Phylliss Bob heart disease, CAD, status post CABG x 3 in 2018, chronic diastolic CHF, hyperlipidemia, hypertension.  Office visit 05/04/2023.  Mild exertional dyspnea likely due to deconditioning.  Echo with mild aortic valve stenosis, continued on current management and option of stress test if symptoms change or worsen.  Continued on hydralazine, irbesartan and isosorbide.  Previous EP evaluation with Dr. Ladona Ridgel, bradycardia resolved after stopping beta-blocker.  1 year follow-up.  Nephrology, Dr. Marisue Humble hypertensive chronic kidney disease, stage III. Note reviewed from February 2024.  Requested Kerendia 10 mg daily at that time.  Continued on chlorthalidone, isosorbide, hydralazine, irbesartan intolerant of SGLT2 due to UTIs.Marland Kitchen  1 year follow-up. Appt there in 2 days.  Zettie Cooley for 30 days last year, cost prohibitive for further refills. New insurance plan this year and more affordable. Back on Kerendia past few weeks.   Diabetes: With CKD, CAD, treated with Januvia 50 mg daily, metformin 500 mg twice daily..  Intolerant to SGLT2s.  He is on statin and ARB.  Some cost issues with Januvia previously.  No recent labs for diabetes. Ran out of Januvia in November d/t cost. Still taking metformin BID without side effects. As above, restarted past few weeks - better now with cost.  Home readings - rare. Around 130-140 Symptomatic lows - none.  Microalbumin: 8.4 on 10/21/2021. Optho, foot exam, pneumovax:  Ophthalmology exam: wears glasses. Saw optho  in 03/2023 -  requesting note.  Pneumonia vaccine - declined.   Lab Results  Component Value Date   HGBA1C 6.7 (H) 05/05/2022   HGBA1C 6.6 (A) 02/01/2022   HGBA1C 7.6 (A) 10/21/2021   Lab Results  Component Value Date   MICROALBUR 8.4 (H) 10/21/2021   LDLCALC 50 05/05/2022   CREATININE 1.73 (H) 05/05/2022   Hypertension: With CKD, CAD as above followed by nephrology and cardiology.  Meds as above.  Denies new side effects.   Also with history of OSA on CPAP in past - stopped using a year ago. Concerned about cleanliness of the machine.  Home readings:  none.  No new med side effects BP Readings from Last 3 Encounters:  08/22/23 128/70  05/04/23 136/88  07/15/22 138/78   Lab Results  Component Value Date   CREATININE 1.73 (H) 05/05/2022   Gout: Last flare: None recently - over a year ago. Treated with alleve.  Daily meds: None Prn med: Colchicine has been prescribed previously if needed. Not taking d/t side effects.  Lab Results  Component Value Date   LABURIC 6.8 12/30/2017   Hyperlipidemia: Lipitor 80 mg daily with history of CABG for CAD as above.  Cardiology follow-up as above.Occasional cramps in hands at base of thumb. Thumb will cramp inward. Past few months. 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  No change in activity.   Lab Results  Component Value Date   CHOL 122 05/05/2022   HDL 54.80 05/05/2022   LDLCALC 50 05/05/2022   TRIG 82.0 05/05/2022   CHOLHDL  2 05/05/2022   Lab Results  Component Value Date   ALT 15 05/05/2022   AST 18 05/05/2022   ALKPHOS 51 05/05/2022   BILITOT 0.5 05/05/2022       08/22/2023    2:49 PM 01/06/2023    1:42 PM 07/15/2022   11:45 AM 05/05/2022    9:43 AM 02/18/2022   11:20 AM  Depression screen PHQ 2/9  Decreased Interest 0 0 0 0 0  Down, Depressed, Hopeless 0 0 0 0 0  PHQ - 2 Score 0 0 0 0 0  Altered sleeping 0 0 0 0   Tired, decreased energy 0 0 0 1   Change in appetite 0 0 0 0   Feeling bad or  failure about yourself  0 0 0 0   Trouble concentrating 0 0 0 0   Moving slowly or fidgety/restless 0 0 0 0   Suicidal thoughts 0 0 0 0   PHQ-9 Score 0 0 0 1   Difficult doing work/chores  Not difficult at all       Health Maintenance  Topic Date Due  . Zoster Vaccines- Shingrix (1 of 2) Never done  . Pneumonia Vaccine 18+ Years old (2 of 2 - PCV) 07/03/2013  . Diabetic kidney evaluation - Urine ACR  10/22/2022  . HEMOGLOBIN A1C  11/04/2022  . OPHTHALMOLOGY EXAM  03/11/2023  . Diabetic kidney evaluation - eGFR measurement  05/06/2023  . Colonoscopy  12/17/2023  . Medicare Annual Wellness (AWV)  01/06/2024  . FOOT EXAM  08/21/2024  . INFLUENZA VACCINE  Completed  . COVID-19 Vaccine  Completed  . Hepatitis C Screening  Completed  . HPV VACCINES  Aged Out  . DTaP/Tdap/Td  Discontinued  Colonoscopy 11/2020. Multiple polyps repeat 3 years.  Prior urology eval for circumcision. Declines PSA testing.   Immunization History  Administered Date(s) Administered  . Fluad Quad(high Dose 65+) 04/07/2022  . Influenza, High Dose Seasonal PF 03/20/2019  . Influenza, Seasonal, Injecte, Preservative Fre 07/03/2012  . Influenza,inj,Quad PF,6+ Mos 08/25/2015, 05/12/2017, 05/29/2018  . Influenza-Unspecified 07/24/2016, 04/23/2020, 05/05/2021, 08/20/2022, 04/15/2023  . PFIZER(Purple Top)SARS-COV-2 Vaccination 08/31/2019, 09/21/2019, 04/02/2020, 12/04/2020, 04/16/2021  . Pneumococcal Polysaccharide-23 07/03/2012  . Tdap 07/03/2012  . Unspecified SARS-COV-2 Vaccination 03/30/2023   No results found. Optho as above My Ey DR in 04/2023  Dental: every 6 months usually.   Alcohol: none  Tobacco: none.  Exercise: minimal. Hip and knee issues. Swimming in past and exercise bike helped.   Wt Readings from Last 3 Encounters:  08/22/23 177 lb 9.6 oz (80.6 kg)  05/04/23 184 lb 3.2 oz (83.6 kg)  07/15/22 185 lb 6.4 oz (84.1 kg)   History Patient Active Problem List   Diagnosis Date Noted  .  Syncope 02/15/2020  . Bradycardia 02/15/2020  . Hypertensive encephalopathy 10/19/2018  . Chronic kidney disease, stage 3 10/19/2018  . DM (diabetes mellitus), type 2 with renal complications (HCC) 10/19/2018  . Cough 03/17/2018  . Lower resp. tract infection 03/17/2018  . Sleep apnea with use of continuous positive airway pressure (CPAP) 01/04/2018  . Malnutrition of moderate degree 07/14/2017  . Thrush 07/11/2017  . FTT (failure to thrive) in adult 07/11/2017  . 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 CAD (coronary artery disease) 05/25/2017  . Unstable angina (HCC)   . Hypertensive heart disease 07/14/2015  . Lower extremity edema 07/14/2015  . Aortic stenosis 01/09/2015  . OSA on CPAP 12/12/2014  . Hypersomnia with sleep apnea 12/12/2014  . Obstructive  sleep apnea (adult) (pediatric) 02/04/2014  . 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 Obesity 02/04/2014  . Mitral regurgitation and aortic stenosis 01/02/2014  . DOE (dyspnea on exertion) 11/26/2013  . Gout 08/12/2011  . Hypertension 08/12/2011  . High cholesterol 08/12/2011  . OSA (obstructive sleep apnea) 08/12/2011  . Diabetes type 2, uncontrolled 08/12/2011   Past Medical History:  Diagnosis Date  . Arthritis    "knees" (07/11/2017)  . CKD (chronic kidney disease), stage III (HCC)    lov dr Alycia Rossetti [place] 04-23-2020 on chart  . Complication of anesthesia    post op afibe after cabg x 2 2018  . Coronary artery disease    a.  CABGx3 in 05/2017.  Marland Kitchen Gout   . Heart murmur    mild, saw dr Ladona Ridgel 03-26-2020 f/u prn  . High cholesterol   . Hypertension   . Mild aortic stenosis   . Mild mitral regurgitation   . OSA on CPAP    last used cpap 3 weeks ago  . Phimosis   . Postoperative atrial fibrillation (HCC) 06/2017  . Sleep apnea    uses CPAP  . Type II diabetes mellitus (HCC)    Past Surgical History:  Procedure Laterality Date  . CARDIAC CATHETERIZATION    . CIRCUMCISION N/A 06/27/2020   Procedure: CIRCUMCISION ADULT;  Surgeon:  Rene Paci, MD;  Location: [facility];  Service: Urology;  Laterality: N/A;  . COLONOSCOPY  2008  . CORONARY ARTERY BYPASS GRAFT N/A 06/24/2017   Procedure: CORONARY ARTERY BYPASS GRAFTING (CABG) x 3 ; Using Left Internal Mammary Artery and Right Great Saphenous Leg Vein Harvested Endoscopically;  Surgeon: Alleen Borne, MD;  Location: [facility] OR;  Service: Open Heart Surgery;  Laterality: N/A;  . LEFT HEART CATH AND CORONARY ANGIOGRAPHY N/A 05/25/2017   Procedure: LEFT HEART CATH AND CORONARY ANGIOGRAPHY;  Surgeon: Iran Ouch, MD;  Location: [facility] INVASIVE CV LAB;  Service: Cardiovascular;  Laterality: N/A;  . TEE WITHOUT CARDIOVERSION N/A 06/24/2017   Procedure: TRANSESOPHAGEAL ECHOCARDIOGRAM (TEE);  Surgeon: Alleen Borne, MD;  Location: [facility] OR;  Service: Open Heart Surgery;  Laterality: N/A;   Allergies  Allergen Reactions  . Penicillins Other (See Comments)    Has patient had a PCN reaction causing immediate rash, facial/tongue/throat swelling, SOB or lightheadedness with hypotension: No Has patient had a PCN reaction causing severe rash involving mucus membranes or skin necrosis: No Has patient had a PCN reaction that required hospitalization: No Has patient had a PCN reaction occurring within the last 10 years: No If all of the above answers are "NO", then may proceed with Cephalosporin use.   Passed out ? SYNCOPE ?  Marland Kitchen Lisinopril Cough  . Amiodarone Nausea And Vomiting  . Amlodipine Other (See Comments)    Causes dizzines  . Beta Adrenergic Blockers     Do not take beta blockers causes heart block with coreg  . Oxycodone Other (See Comments)    hallucinations  . Zofran [Ondansetron Hcl] Other (See Comments)    Has an interaction with another medication pt takes   . Cortisone Nausea And Vomiting and Other (See Comments)    HICCUPS  . Tramadol Nausea And Vomiting and Other (See Comments)    HICCUPS   Prior to Admission medications    Medication Sig Start Date End Date Taking? Authorizing Provider  acetaminophen (TYLENOL) 325 MG tablet Take 650 mg by mouth every 6 (six) hours as needed for mild pain.   Yes [provider]  aspirin EC 81 MG tablet Take 1 tablet (81 mg total) by mouth daily. 09/02/17  Yes Dunn, Dayna N, PA-C  atorvastatin (LIPITOR) 80 MG tablet TAKE 1 TABLET BY MOUTH DAILY AT  6 PM 06/01/23  Yes Shade Flood, MD  chlorthalidone (HYGROTON) 25 MG tablet TAKE ONE-HALF TABLET BY MOUTH  EVERY MORNING 06/01/23  Yes Shade Flood, MD  glucose blood test strip Use as instructed 01/21/20  Yes Shade Flood, MD  hydrALAZINE (APRESOLINE) 50 MG tablet TAKE 1 TABLET BY MOUTH IN THE  MORNING AND 2 TABLETS BY MOUTH  IN THE EVENING 08/02/23  Yes Shade Flood, MD  irbesartan (AVAPRO) 300 MG tablet Take 1 tablet (300 mg total) by mouth daily. TAKE 1 TABLET BY MOUTH DAILY 05/04/23  Yes Jake Bathe, MD  isosorbide mononitrate (IMDUR) 60 MG 24 hr tablet Take 1 tablet (60 mg total) by mouth at bedtime. Please call office to schedule an appt for further refills. Thank you 05/04/23  Yes Jake Bathe, MD  KERENDIA 10 MG TABS Take 1 tablet by mouth daily. 08/05/23  Yes [provider]  metFORMIN (GLUCOPHAGE) 500 MG tablet Take 1 tablet (500 mg total) by mouth 2 (two) times daily with a meal. TAKE 1 TABLET BY MOUTH TWICE  DAILY WITH MEALS 05/04/23  Yes Jake Bathe, MD  sitaGLIPtin (JANUVIA) 50 MG tablet Take 1 tablet (50 mg total) by mouth daily. 08/02/23  Yes Shade Flood, MD  benzonatate (TESSALON) 100 MG capsule Take 1 capsule (100 mg total) by mouth 3 (three) times daily as needed for cough. Patient not taking: Reported on 08/22/2023 07/15/22   Shade Flood, MD  colchicine 0.6 MG tablet TAKE 2 TABS ON ONSET OF GOUT FLARE THEN REPEAT IN 1 HOUR OF ACUTE GOUT ATTACK Patient not taking: Reported on 08/22/2023 07/06/19   Shade Flood, MD  guaiFENesin (MUCINEX) 600 MG 12 hr tablet Take 600 mg by  mouth 2 (two) times daily as needed (congestion, cough). Patient not taking: Reported on 08/22/2023    [provider]  guaiFENesin-codeine (CHERATUSSIN AC) 100-10 MG/5ML syrup Take 5 mLs by mouth at bedtime as needed for cough. Patient not taking: Reported on 08/22/2023 07/21/22   Shade Flood, MD  Lancets [facility] ULTRASOFT) lancets 1 each by Other route 1 day or 1 dose for 1 dose. Use as instructed 01/30/20 04/13/22  Shade Flood, MD   Social History   Socioeconomic History  . Marital status: Married    Spouse name: Bonita Quin  . Number of children: 3  . Years of education: 16  . Highest education level: Bachelor's degree (e.g., BA, AB, BS)  Occupational History  . Occupation: Musician  Tobacco Use  . Smoking status: Former    Current packs/day: 0.00    Average packs/day: 2.0 packs/day for 15.0 years (30.0 ttl pk-yrs)    Types: Cigarettes    Start date: 07/27/1983    Quit date: 07/26/1998    Years since quitting: 25.0  . Smokeless tobacco: Never  Vaping Use  . Vaping status: Never Used  Substance and Sexual Activity  . Alcohol use: Not Currently    Alcohol/week: 0.0 standard drinks of alcohol  . Drug use: No  . Sexual activity: Yes  Other Topics Concern  . Not on file  Social History Narrative         Social Drivers of Health   Financial Resource Strain: Low Risk  (08/18/2023)  Overall Physicist, medical Strain (CARDIA)   . Difficulty of Paying Living Expenses: Not hard at all  Food Insecurity: No Food Insecurity (08/18/2023)   Hunger Vital Sign   . Worried About Programme researcher, broadcasting/film/video in the Last Year: Never true   . Ran Out of Food in the Last Year: Never true  Transportation Needs: No Transportation Needs (08/18/2023)   PRAPARE - Transportation   . Lack of Transportation (Medical): No   . Lack of Transportation (Non-Medical): No  Physical Activity: Insufficiently Active (08/18/2023)   Exercise Vital Sign   . Days of Exercise per Week: 1 day   . Minutes of  Exercise per Session: 20 min  Stress: No Stress Concern Present (08/18/2023)   [employer] of Occupational Health - Occupational Stress Questionnaire   . Feeling of Stress : Not at all  Social Connections: Socially Integrated (08/18/2023)   Social Connection and Isolation Panel [NHANES]   . Frequency of Communication with Friends and Family: More than three times a week   . Frequency of Social Gatherings with Friends and Family: Three times a week   . Attends Religious Services: 1 to 4 times per year   . Active Member of Clubs or Organizations: Yes   . Attends Banker Meetings: More than 4 times per year   . Marital Status: Married  Catering manager Violence: Not At Risk (01/06/2023)   Humiliation, Afraid, Rape, and Kick questionnaire   . Fear of Current or Ex-Partner: No   . Emotionally Abused: No   . Physically Abused: No   . Sexually Abused: No    Review of Systems   Objective:  There were no vitals filed for this visit. {Vitals History (Optional):23777}  Physical Exam     Assessment & Plan:  NICHOLA WARREN is a 78 y.o. male . Hyperlipidemia, unspecified hyperlipidemia type  Essential hypertension  Type 2 diabetes mellitus with chronic kidney disease, without long-term current use of insulin, unspecified CKD stage (HCC)  Screening for prostate cancer   No orders of the defined types were placed in this encounter.  There are no Patient Instructions on file for this visit.    Signed,   Meredith Staggers, MD [facility], [facility] 08/22/23 2:58 PM

## 2023-08-23 ENCOUNTER — Encounter: Payer: Self-pay | Admitting: Family Medicine

## 2023-08-23 LAB — CBC WITH DIFFERENTIAL/PLATELET
Basophils Absolute: 0 10*3/uL (ref 0.0–0.1)
Basophils Relative: 0.9 % (ref 0.0–3.0)
Eosinophils Absolute: 0.1 10*3/uL (ref 0.0–0.7)
Eosinophils Relative: 1.3 % (ref 0.0–5.0)
HCT: 41.2 % (ref 39.0–52.0)
Hemoglobin: 13.4 g/dL (ref 13.0–17.0)
Lymphocytes Relative: 39.8 % (ref 12.0–46.0)
Lymphs Abs: 1.8 10*3/uL (ref 0.7–4.0)
MCHC: 32.5 g/dL (ref 30.0–36.0)
MCV: 96.5 fL (ref 78.0–100.0)
Monocytes Absolute: 0.5 10*3/uL (ref 0.1–1.0)
Monocytes Relative: 11 % (ref 3.0–12.0)
Neutro Abs: 2.1 10*3/uL (ref 1.4–7.7)
Neutrophils Relative %: 47 % (ref 43.0–77.0)
Platelets: 182 10*3/uL (ref 150.0–400.0)
RBC: 4.27 Mil/uL (ref 4.22–5.81)
RDW: 14.5 % (ref 11.5–15.5)
WBC: 4.6 10*3/uL (ref 4.0–10.5)

## 2023-08-23 LAB — COMPREHENSIVE METABOLIC PANEL
ALT: 16 U/L (ref 0–53)
AST: 20 U/L (ref 0–37)
Albumin: 4.8 g/dL (ref 3.5–5.2)
Alkaline Phosphatase: 52 U/L (ref 39–117)
BUN: 30 mg/dL — ABNORMAL HIGH (ref 6–23)
CO2: 29 meq/L (ref 19–32)
Calcium: 9.4 mg/dL (ref 8.4–10.5)
Chloride: 105 meq/L (ref 96–112)
Creatinine, Ser: 1.78 mg/dL — ABNORMAL HIGH (ref 0.40–1.50)
GFR: 36.57 mL/min — ABNORMAL LOW (ref >60.00)
Glucose, Bld: 99 mg/dL (ref 70–99)
Potassium: 4.8 meq/L (ref 3.5–5.1)
Sodium: 143 meq/L (ref 135–145)
Total Bilirubin: 0.5 mg/dL (ref 0.2–1.2)
Total Protein: 7.5 g/dL (ref 6.0–8.3)

## 2023-08-23 LAB — MICROALBUMIN / CREATININE URINE RATIO
Creatinine,U: 109.6 mg/dL
Microalb Creat Ratio: 20.5 mg/g (ref 0.0–30.0)
Microalb, Ur: 22.5 mg/dL — ABNORMAL HIGH (ref 0.0–1.9)

## 2023-08-23 LAB — LIPID PANEL
Cholesterol: 152 mg/dL (ref 0–200)
HDL: 57.3 mg/dL (ref >39.00)
LDL Cholesterol: 69 mg/dL (ref 0–99)
NonHDL: 94.39
Total CHOL/HDL Ratio: 3
Triglycerides: 127 mg/dL (ref 0.0–149.0)
VLDL: 25.4 mg/dL (ref 0.0–40.0)

## 2023-08-23 LAB — HEMOGLOBIN A1C: Hgb A1c MFr Bld: 7.1 % — ABNORMAL HIGH (ref 4.6–6.5)

## 2023-08-23 LAB — CK: Total CK: 209 U/L (ref 7–232)

## 2023-08-26 ENCOUNTER — Encounter: Payer: Self-pay | Admitting: Family Medicine

## 2023-10-26 ENCOUNTER — Other Ambulatory Visit: Payer: Self-pay | Admitting: Cardiology

## 2023-10-26 DIAGNOSIS — E1122 Type 2 diabetes mellitus with diabetic chronic kidney disease: Secondary | ICD-10-CM

## 2023-10-26 MED ORDER — ISOSORBIDE MONONITRATE ER 60 MG PO TB24: 60.0000 mg | ORAL_TABLET | Freq: Every day | ORAL | 1 refills | Status: DC

## 2023-10-26 NOTE — Telephone Encounter (Signed)
*  STAT* If patient is at the pharmacy, call can be transferred to refill team.   1. Which medications need to be refilled? (please list name of each medication and dose if known) isosorbide mononitrate (IMDUR) 60 MG 24 hr tablet metFORMIN (GLUCOPHAGE) 500 MG tablet  2. Which pharmacy/location (including street and city if local pharmacy) is medication to be sent to? [facility] - [place], [place] - [address]  3. Do they need a 30 day or 90 day supply?   90 day supply

## 2023-10-26 NOTE — Telephone Encounter (Signed)
 Pt is requesting a refill on medication metformin. Dr. Anne Fu does not manage pt's medication metformin, but it was refilled under Dr. Anne Fu name. Would Dr. Anne Fu like to refill this non cardiac medication? Please address

## 2023-10-31 ENCOUNTER — Telehealth: Payer: Self-pay

## 2023-10-31 ENCOUNTER — Other Ambulatory Visit: Payer: Self-pay

## 2023-10-31 DIAGNOSIS — I10 Essential (primary) hypertension: Secondary | ICD-10-CM

## 2023-10-31 DIAGNOSIS — I2510 Atherosclerotic heart disease of native coronary artery without angina pectoris: Secondary | ICD-10-CM

## 2023-10-31 MED ORDER — CHLORTHALIDONE 25 MG PO TABS: 12.5000 mg | ORAL_TABLET | Freq: Every morning | ORAL | 2 refills | Status: DC

## 2023-10-31 MED ORDER — ATORVASTATIN CALCIUM 80 MG PO TABS: ORAL_TABLET | ORAL | 1 refills | Status: DC

## 2023-10-31 NOTE — Telephone Encounter (Signed)
 Copied from CRM [id number]. Topic: Clinical - Prescription Issue >> Oct 31, 2023  9:46 AM Drema Balzarine wrote: Reason for CRM: Patient called says the office needs to call [facility] at [phone] regarding the Chlorthalidone, Irbesartan, isosorbide mononitrate (IMDUR) 60 MG 24 hr tablet & Atorvastatin

## 2023-10-31 NOTE — Telephone Encounter (Signed)
 Was able to get the Atorvastatin and Chlorthalidone taken care of will have to call Cards about Imdur and Irbesartan as he is prescribing physician   Called to advise patient of this no answer

## 2023-11-01 ENCOUNTER — Telehealth: Payer: Self-pay | Admitting: Family Medicine

## 2023-11-01 NOTE — Telephone Encounter (Signed)
 Copied from CRM [id number]. Topic: Clinical - Medication Question >> Nov 01, 2023  9:57 AM Alcus Dad wrote:   Reason for CRM: Patient is going out of town next week for a few weeks and needs enough medication before he leaves. Please give patient a call back regarding the note left and also this matter as well

## 2023-11-02 ENCOUNTER — Telehealth: Payer: Self-pay | Admitting: Cardiology

## 2023-11-02 NOTE — Telephone Encounter (Signed)
 Pt of Dr. Anne Fu. Requesting a refill of Metformin. Does Dr. Anne Fu want to refill? Please advise

## 2023-11-02 NOTE — Telephone Encounter (Signed)
*  STAT* If patient is at the pharmacy, call can be transferred to refill team.   1. Which medications need to be refilled? (please list name of each medication and dose if known) metFORMIN (GLUCOPHAGE) 500 MG tablet   2. Which pharmacy/location (including street and city if local pharmacy) is medication to be sent to? [id number] - [place], [place] - [address]   3. Do they need a 30 day or 90 day supply? 90

## 2023-11-02 NOTE — Telephone Encounter (Signed)
 Pt has been informed of refill status and advised to reach out to [employer] office for other requested refills

## 2023-11-07 ENCOUNTER — Telehealth: Payer: Self-pay

## 2023-11-07 DIAGNOSIS — E1122 Type 2 diabetes mellitus with diabetic chronic kidney disease: Secondary | ICD-10-CM

## 2023-11-07 MED ORDER — METFORMIN HCL 500 MG PO TABS: 500.0000 mg | ORAL_TABLET | Freq: Two times a day (BID) | ORAL | 3 refills | Status: DC

## 2023-11-07 NOTE — Telephone Encounter (Signed)
 Pt is calling to check the status of this refill. Pt is going out of town and just needs 2 [facility]. Then his regular script sent to [facility]. Please advise

## 2023-11-07 NOTE — Telephone Encounter (Signed)
 Absolutely, happy to refill metformin.  Diabetes discussed in January.

## 2023-11-07 NOTE — Addendum Note (Signed)
 Addended by: Dionisios Ricci R on: 11/07/2023 08:40 PM   Modules accepted: Orders

## 2023-11-07 NOTE — Telephone Encounter (Signed)
 Copied from CRM [id number]. Topic: Clinical - Medication Question >> Nov 01, 2023  9:57 AM Ovid Blow wrote: Reason for CRM: Patient is going out of town next week for a few weeks and needs enough medication before he leaves. Please give patient a call back regarding the note left and also this matter as well >> Nov 07, 2023  9:54 AM Chuck Crater wrote: Patient stated that he is going out of town Wednesday and has already called Dr. Renna Cary for a refill. Dr. Renna Cary will not refill because it is a diabetic medication. Please contact patient with an update.

## 2023-11-07 NOTE — Telephone Encounter (Signed)
 Per message on 11/02/2023 Dr Dorothye Gathers declined refilling metformin despite being prescriber on this medication are you willing to take over this medication?   Other meds filled last week during initial request for refill.

## 2023-11-08 ENCOUNTER — Other Ambulatory Visit: Payer: Self-pay | Admitting: Family Medicine

## 2023-11-08 NOTE — Telephone Encounter (Unsigned)
 Copied from CRM [id number]. Topic: Clinical - Medication Refill >> Nov 08, 2023 10:52 AM Danae Duncans wrote: Most Recent Primary Care Visit:  Provider: Caro Christmas R  Department: [facility]  Visit Type: PHYSICAL  Date: 08/22/2023  Medication: metFORMIN (GLUCOPHAGE) 500 MG tablet  Has the patient contacted their pharmacy? Yes (Agent: If no, request that the patient contact the pharmacy for the refill. If patient does not wish to contact the pharmacy document the reason why and proceed with request.) (Agent: If yes, when and what did the pharmacy advise?) Medication will not get here on time, Pt is leaving for vacation for 2 weeks tomorrow  Is this the correct pharmacy for this prescription? Yes If no, delete pharmacy and type the correct one.   [id number] [place], [place] - [phone] AT [facility] [address] [place] [address] Phone: [phone] Fax: [phone]   Has the prescription been filled recently? no  Is the patient out of the medication? Yes  Has the patient been seen for an appointment in the last year OR does the patient have an upcoming appointment? Yes  Can we respond through MyChart? Yes  Agent: Please be advised that Rx refills may take up to 3 business days. We ask that you follow-up with your pharmacy.

## 2023-11-10 ENCOUNTER — Telehealth: Payer: Self-pay | Admitting: Family Medicine

## 2023-11-10 DIAGNOSIS — E1122 Type 2 diabetes mellitus with diabetic chronic kidney disease: Secondary | ICD-10-CM

## 2023-11-10 MED ORDER — METFORMIN HCL 500 MG PO TABS: 500.0000 mg | ORAL_TABLET | Freq: Two times a day (BID) | ORAL | 0 refills | Status: DC

## 2023-11-10 NOTE — Telephone Encounter (Signed)
 Temporary refill sent to requested pharmacy in [place] , 30-day supply as per prior message he was going to be out of town for a few weeks.  Let me know if I can help further.

## 2023-11-10 NOTE — Telephone Encounter (Signed)
 Patient is out of town and currently ran out of his metformin,  Patient is wondering if we can sen in and" emergency" script to him in [place] ?

## 2023-11-10 NOTE — Telephone Encounter (Signed)
 Copied from CRM [id number]. Topic: Clinical - Prescription Issue >> Nov 10, 2023  2:44 PM Orien Bird wrote: Reason for CRM: patient needs an emergency prescription of the metformin patient is out of town and he doesn't have any while he is on vacation.   [facility] [address] [place], [place] [address]

## 2023-11-24 ENCOUNTER — Telehealth: Payer: Self-pay

## 2023-11-24 NOTE — Telephone Encounter (Signed)
 Copied from CRM [id number]. Topic: Clinical - Prescription Issue >> Nov 24, 2023 12:51 PM Concetta Dee E wrote: Reason for CRM: [facility] irbesartan  (AVAPRO ) 300 MG tablet [[id number]] requesting up to date prescription

## 2023-11-25 ENCOUNTER — Telehealth: Payer: Self-pay

## 2023-11-25 DIAGNOSIS — I10 Essential (primary) hypertension: Secondary | ICD-10-CM

## 2023-11-25 MED ORDER — IRBESARTAN 300 MG PO TABS: 300.0000 mg | ORAL_TABLET | Freq: Every day | ORAL | 3 refills | Status: DC

## 2023-11-25 NOTE — Telephone Encounter (Signed)
 I saw patient in January, medications were discussed at that time so I am fine filling it.  Looking at his meds the irbesartan  was prescribed in 04/2023 for 3 refills by cardiology, but has new insurance - needs sent to [facility], not [facility]. I sent refills.

## 2023-11-25 NOTE — Telephone Encounter (Signed)
 This is prescribed to the patient by Dr Dorothye Gathers he will need to discuss with that provider will call to advise patient

## 2023-11-25 NOTE — Telephone Encounter (Signed)
 Please see below message from the call back from patient regarding the refill.    Copied from CRM [id number]. Topic: Clinical - Prescription Issue >> Nov 25, 2023  1:56 PM [place] J wrote: Patient calling back to see if there was an update on this medication. I told him that Dr. Renna Cary was the ordering provider so he would have to refill that medication, not Dr. Ester Helms. Patient stated that he's been through this before and that Dr. Renna Cary office denied him and routed him back to primary for a refill to be completed.

## 2023-11-28 ENCOUNTER — Telehealth: Payer: Self-pay | Admitting: Family Medicine

## 2023-11-28 DIAGNOSIS — Z0279 Encounter for issue of other medical certificate: Secondary | ICD-10-CM

## 2023-11-28 NOTE — Telephone Encounter (Signed)
 Filled in patient info and placed in sign folder at station for provider

## 2023-11-28 NOTE — Telephone Encounter (Signed)
 Patient left Disability placard for to be filled out. Please call patient when ready for pick up.

## 2023-11-28 NOTE — Telephone Encounter (Signed)
 Paperwork completed and placed in fax bin at back nurse station

## 2023-11-30 NOTE — Telephone Encounter (Signed)
 Called patient and he asked that I drop this in the mail for him, this has now been sent out.

## 2023-12-01 DIAGNOSIS — E119 Type 2 diabetes mellitus without complications: Secondary | ICD-10-CM | POA: Diagnosis not present

## 2023-12-01 DIAGNOSIS — H5203 Hypermetropia, bilateral: Secondary | ICD-10-CM | POA: Diagnosis not present

## 2023-12-01 DIAGNOSIS — H52223 Regular astigmatism, bilateral: Secondary | ICD-10-CM | POA: Diagnosis not present

## 2023-12-01 DIAGNOSIS — Z135 Encounter for screening for eye and ear disorders: Secondary | ICD-10-CM | POA: Diagnosis not present

## 2023-12-01 DIAGNOSIS — H524 Presbyopia: Secondary | ICD-10-CM | POA: Diagnosis not present

## 2023-12-01 DIAGNOSIS — H2513 Age-related nuclear cataract, bilateral: Secondary | ICD-10-CM | POA: Diagnosis not present

## 2023-12-01 LAB — HM DIABETES EYE EXAM

## 2023-12-12 ENCOUNTER — Other Ambulatory Visit: Payer: Self-pay

## 2023-12-12 DIAGNOSIS — E1122 Type 2 diabetes mellitus with diabetic chronic kidney disease: Secondary | ICD-10-CM

## 2023-12-14 ENCOUNTER — Other Ambulatory Visit: Payer: Self-pay | Admitting: Family Medicine

## 2023-12-14 ENCOUNTER — Other Ambulatory Visit: Payer: Self-pay

## 2023-12-14 DIAGNOSIS — E1122 Type 2 diabetes mellitus with diabetic chronic kidney disease: Secondary | ICD-10-CM

## 2023-12-14 MED ORDER — METFORMIN HCL 500 MG PO TABS: 500.0000 mg | ORAL_TABLET | Freq: Two times a day (BID) | ORAL | 0 refills | Status: DC

## 2023-12-14 NOTE — Telephone Encounter (Signed)
 Copied from CRM [id number]. Topic: Clinical - Medication Refill >> Dec 14, 2023 12:46 PM Leah C wrote: Medication: metFORMIN  (GLUCOPHAGE ) 500 MG tablet  Has the patient contacted their pharmacy? [facility] contacted the line for the refill request.  (Agent: If no, request that the patient contact the pharmacy for the refill. If patient does not wish to contact the pharmacy document the reason why and proceed with request.) (Agent: If yes, when and what did the pharmacy advise?)  This is the patient's preferred pharmacy:  [facility] [address], [place] - [address] AT [facility] [address] [place] [address] Phone: [phone] Fax: [phone]  Is this the correct pharmacy for this prescription? Yes If no, delete pharmacy and type the correct one.   Has the prescription been filled recently? No  Is the patient out of the medication? Yes  Has the patient been seen for an appointment in the last year OR does the patient have an upcoming appointment? Yes  Can we respond through MyChart? Yes  Agent: Please be advised that Rx refills may take up to 3 business days. We ask that you follow-up with your pharmacy.

## 2024-01-03 ENCOUNTER — Encounter

## 2024-01-03 NOTE — Progress Notes (Unsigned)
 01/04/2024 NASON CONRADT [id number] [birth year]  Referring provider: Benjiman Bras, MD Primary GI doctor: Dr. Karene Oto  ASSESSMENT AND PLAN:  Personal history of tubular adenomatous polyps Family history of colon cancer in brother diagnosed early age 77 05/2018 colonoscopy 9 TA polyps 12/16/2020 colonoscopy-Cirigliano good prep 9 polyps 2 to 5 mm rectum sigmoid transverse ascending colon, diverticulosis, nonbleeding internal hemorrhoids normal TI Recall 3 years Denies changes in bowel habits, no blood in stool -Consider genetic testing, states had a year ago, unable to see results -Patient is healthy enough to under go colonoscopy however he has history of mild aortic stenosis close to 2 years ago since last echo, is inactive, has DOE and swelling, will get echo prior to colonoscopy to evaluate degree of stenosis.  We have discussed the risks of bleeding, infection, perforation, medication reactions, and remote risk of death associated with colonoscopy. All questions were answered and the patient acknowledges these risk and wishes to proceed.  Mild aortic valve stenosis Echocardiogram: Mild aortic valve stenosis with normal ejection fraction (04/22/2022)  Some leg swelling, mild DOE but unchanged from CABG in 2018 - repeat echo prior to colon with symptoms  Fatty liver Seen on US  2015    Latest Ref Rng & Units 08/22/2023    3:49 PM 05/05/2022   10:55 AM 08/02/2021    2:24 PM  Hepatic Function  Total Protein 6.0 - 8.3 g/dL 7.5  7.3  7.1   Albumin  3.5 - 5.2 g/dL 4.8  4.5  3.9   AST 0 - 37 U/L 20  18  29    ALT 0 - 53 U/L 16  15  23    Alk Phosphatase 39 - 117 U/L 52  51  51   Total Bilirubin 0.2 - 1.2 mg/dL 0.5  0.5  0.6    Platelets 182.0  FIB 4 = 2.09 further investigative - US  elastography - need LFTs and CBC monitored every 6 months, - consider US  -Continue to work on risk factor modification including diet exercise and control of risk factors including blood  sugars.  CAD status post bypass 2018 Follows with Dr. Renna Cary On bASA, some DOE/dependent edema, no CP  CKD  Patient Care Team: Benjiman Bras, MD as PCP - General (Family Medicine) Hugh Madura, MD as PCP - Cardiology (Cardiology) Liza Riggers, MD as Consulting Physician (Cardiology) Charletta Cons, MD as Attending Physician (Nephrology)  HISTORY OF PRESENT ILLNESS: 78 y.o. male with a past medical history listed below presents for evaluation of colonoscopy.   Discussed the use of AI scribe software for clinical note transcription with the patient, who gave verbal consent to proceed.  History of Present Illness   ADARRIUS GRAEFF is a 78 year old male with a history of multiple colon polyps who presents for a follow-up colonoscopy.  He has a history of multiple colon polyps, with nine polyps removed in both 2019 and 2022. He is due for a follow-up colonoscopy as part of routine surveillance. No changes in bowel habits, dark or bloody stools, and he has regular bowel movements, typically two to three times daily, which are formed. No abdominal pain.  He has a family history of colon cancer, with his brother recently diagnosed with stage four colon cancer and passing away. He recalls undergoing genetic testing in the past, though he is uncertain of the details or results.  He experiences occasional shortness of breath, particularly with exertion, which he attributes to his lifestyle and  post-heart surgery status. 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 He also reports swelling in his ankles, which improves with rest overnight but worsens with prolonged sitting or wearing certain socks.  He follows with a cardiologist and last saw him in October 2020. His last echocardiogram was in 2023. No heartburn, nausea, vomiting, or difficulty swallowing. He is not on any blood thinners other than aspirin .      He  reports that he quit smoking  about 25 years ago. His smoking use included cigarettes. He started smoking about 40 years ago. He has a 30 pack-year smoking history. He has never used smokeless tobacco. He reports that he does not currently use alcohol. He reports that he does not use drugs.  RELEVANT GI HISTORY, IMAGING AND LABS: Results   Echocardiogram: Mild aortic stenosis (2023)      CBC    Component Value Date/Time   WBC 4.6 08/22/2023 1549   RBC 4.27 08/22/2023 1549   HGB 13.4 08/22/2023 1549   HGB 14.4 07/31/2019 0736   HCT 41.2 08/22/2023 1549   HCT 42.1 07/31/2019 0736   PLT 182.0 08/22/2023 1549   PLT 186 07/31/2019 0736   MCV 96.5 08/22/2023 1549   MCV 93 07/31/2019 0736   MCH 30.4 08/02/2021 1424   MCHC 32.5 08/22/2023 1549   RDW 14.5 08/22/2023 1549   RDW 13.8 07/31/2019 0736   LYMPHSABS 1.8 08/22/2023 1549   LYMPHSABS 2.9 07/31/2019 0736   MONOABS 0.5 08/22/2023 1549   EOSABS 0.1 08/22/2023 1549   EOSABS 0.1 07/31/2019 0736   BASOSABS 0.0 08/22/2023 1549   BASOSABS 0.0 07/31/2019 0736   Recent Labs    08/22/23 1549  HGB 13.4    CMP     Component Value Date/Time   NA 143 08/22/2023 1549   NA 141 05/28/2020 1052   K 4.8 08/22/2023 1549   CL 105 08/22/2023 1549   CO2 29 08/22/2023 1549   GLUCOSE 99 08/22/2023 1549   BUN 30 (H) 08/22/2023 1549   BUN 19 05/28/2020 1052   CREATININE 1.78 (H) 08/22/2023 1549   CREATININE 1.34 (H) 04/22/2016 1141   CALCIUM  9.4 08/22/2023 1549   PROT 7.5 08/22/2023 1549   PROT 6.8 05/28/2020 1052   ALBUMIN  4.8 08/22/2023 1549   ALBUMIN  4.5 05/28/2020 1052   AST 20 08/22/2023 1549   ALT 16 08/22/2023 1549   ALKPHOS 52 08/22/2023 1549   BILITOT 0.5 08/22/2023 1549   BILITOT 0.5 05/28/2020 1052   GFRNONAA 51 (L) 08/02/2021 1424   GFRNONAA 53 (L) 10/15/2015 1638   GFRAA 46 (L) 05/28/2020 1052   GFRAA 61 10/15/2015 1638      Latest Ref Rng & Units 08/22/2023    3:49 PM 05/05/2022   10:55 AM 08/02/2021    2:24 PM  Hepatic Function  Total Protein  6.0 - 8.3 g/dL 7.5  7.3  7.1   Albumin  3.5 - 5.2 g/dL 4.8  4.5  3.9   AST 0 - 37 U/L 20  18  29    ALT 0 - 53 U/L 16  15  23    Alk Phosphatase 39 - 117 U/L 52  51  51   Total Bilirubin 0.2 - 1.2 mg/dL 0.5  0.5  0.6       Current Medications:   Current Outpatient Medications (Endocrine & Metabolic):    metFORMIN  (GLUCOPHAGE ) 500 MG tablet, Take 1 tablet (500 mg total) by mouth 2 (  two) times daily with a meal. TAKE 1 TABLET BY MOUTH TWICE  DAILY WITH MEALS   sitaGLIPtin  (JANUVIA ) 50 MG tablet, Take 1 tablet (50 mg total) by mouth daily.   KERENDIA 10 MG TABS, Take 1 tablet by mouth daily. (Patient not taking: Reported on 01/04/2024)   metFORMIN  (GLUCOPHAGE ) 500 MG tablet, Take 1 tablet (500 mg total) by mouth 2 (two) times daily with a meal. TAKE 1 TABLET BY MOUTH TWICE  DAILY WITH MEALS  Current Outpatient Medications (Cardiovascular):    atorvastatin  (LIPITOR ) 80 MG tablet, TAKE 1 TABLET BY MOUTH DAILY AT  6 PM   chlorthalidone  (HYGROTON ) 25 MG tablet, Take 0.5 tablets (12.5 mg total) by mouth every morning.   hydrALAZINE  (APRESOLINE ) 50 MG tablet, TAKE 1 TABLET BY MOUTH IN THE  MORNING AND 2 TABLETS BY MOUTH  IN THE EVENING   irbesartan  (AVAPRO ) 300 MG tablet, Take 1 tablet (300 mg total) by mouth daily. TAKE 1 TABLET BY MOUTH DAILY   isosorbide  mononitrate (IMDUR ) 60 MG 24 hr tablet, Take 1 tablet (60 mg total) by mouth at bedtime.   Current Outpatient Medications (Analgesics):    acetaminophen  (TYLENOL ) 325 MG tablet, Take 650 mg by mouth every 6 (six) hours as needed for mild pain.   aspirin  EC 81 MG tablet, Take 1 tablet (81 mg total) by mouth daily.   Current Outpatient Medications (Other):    glucose blood test strip, Use as instructed   Na Sulfate-K Sulfate-Mg Sulfate concentrate (SUPREP BOWEL PREP KIT) 17.5-3.13-1.6 GM/177ML SOLN, Take 1 kit (354 mLs total) by mouth as directed.   Lancets (ONETOUCH ULTRASOFT) lancets, 1 each by Other route 1 day or 1 dose for 1 dose. Use as  instructed  Medical History:  Past Medical History:  Diagnosis Date   Arthritis    knees (07/11/2017)   CKD (chronic kidney disease), stage III (HCC)    lov dr Verdie Gladden [facility] 04-23-2020 on chart   Colon polyp    Complication of anesthesia    post op afibe after cabg x 2 2018   Coronary artery disease    a.  CABGx3 in 05/2017.   Gout    Heart murmur    mild, saw dr Carolynne Citron 03-26-2020 f/u prn   High cholesterol    Hypertension    Mild aortic stenosis    Mild mitral regurgitation    OSA on CPAP    last used cpap 3 weeks ago   Phimosis    Postoperative atrial fibrillation (HCC) 06/2017   Sleep apnea    uses CPAP   Type II diabetes mellitus (HCC)    Allergies:  Allergies  Allergen Reactions   Penicillins Other (See Comments)    Has patient had a PCN reaction causing immediate rash, facial/tongue/throat swelling, SOB or lightheadedness with hypotension: No Has patient had a PCN reaction causing severe rash involving mucus membranes or skin necrosis: No Has patient had a PCN reaction that required hospitalization: No Has patient had a PCN reaction occurring within the last 10 years: No If all of the above answers are NO, then may proceed with Cephalosporin use.   Passed out ? SYNCOPE ?   Lisinopril Cough   Amiodarone  Nausea And Vomiting   Amlodipine  Other (See Comments)    Causes dizzines   Beta Adrenergic Blockers     Do not take beta blockers causes heart block with coreg    Oxycodone  Other (See Comments)    hallucinations   Zofran  [Ondansetron  Hcl] Other (See Comments)  Has an interaction with another medication pt takes    Cortisone Nausea And Vomiting and Other (See Comments)    HICCUPS   Tramadol  Nausea And Vomiting and Other (See Comments)    HICCUPS     Surgical History:  He  has a past surgical history that includes Colonoscopy (2008); LEFT HEART CATH AND CORONARY ANGIOGRAPHY (N/A, 05/25/2017); TEE without cardioversion (N/A, 06/24/2017); Cardiac  catheterization; Coronary artery bypass graft (N/A, 06/24/2017); and Circumcision (N/A, 06/27/2020). Family History:  His family history includes Cancer in his brother; Colon cancer (age of onset: 8) in his brother; Diabetes in his mother; Hypertension in his father and mother; Kidney disease in his mother; Stroke in his brother.  REVIEW OF SYSTEMS  : All other systems reviewed and negative except where noted in the History of Present Illness.  PHYSICAL EXAM: BP 118/68   Pulse 72   Ht 5' 2 (1.575 m)   Wt 181 lb 9.6 oz (82.4 kg)   BMI 33.22 kg/m  Physical Exam   GENERAL APPEARANCE: Well nourished, in no apparent distress. HEENT: No cervical lymphadenopathy, unremarkable thyroid , sclerae anicteric, conjunctiva pink. RESPIRATORY: Respiratory effort normal, breath sounds equal bilaterally without rales, rhonchi, or wheezing. CARDIO: Regular rate and rhythm with a very loud aortic systolic murmur, peripheral pulses intact. ABDOMEN: Soft, non-distended, active bowel sounds in all four quadrants, no tenderness to palpation, no rebound, no mass appreciated. RECTAL: Declines. MUSCULOSKELETAL: Full range of motion, normal gait. SKIN: Dry, intact without rashes or lesions. No jaundice. NEURO: Alert, oriented, no focal deficits. PSYCH: Cooperative, normal mood and affect. EXTREMITIES: 2+ pitting edema in lower extremities.      Edmonia Gottron, PA-C 11:36 AM

## 2024-01-04 ENCOUNTER — Encounter: Payer: Self-pay | Admitting: Physician Assistant

## 2024-01-04 ENCOUNTER — Ambulatory Visit: Payer: Medicare (Managed Care) | Admitting: Physician Assistant

## 2024-01-04 VITALS — BP 118/68 | HR 72 | Ht 62.0 in | Wt 181.6 lb

## 2024-01-04 DIAGNOSIS — G4733 Obstructive sleep apnea (adult) (pediatric): Secondary | ICD-10-CM | POA: Diagnosis not present

## 2024-01-04 DIAGNOSIS — K760 Fatty (change of) liver, not elsewhere classified: Secondary | ICD-10-CM | POA: Diagnosis not present

## 2024-01-04 DIAGNOSIS — R0609 Other forms of dyspnea: Secondary | ICD-10-CM | POA: Diagnosis not present

## 2024-01-04 DIAGNOSIS — Z800 Family history of malignant neoplasm of digestive organs: Secondary | ICD-10-CM | POA: Diagnosis not present

## 2024-01-04 DIAGNOSIS — I350 Nonrheumatic aortic (valve) stenosis: Secondary | ICD-10-CM | POA: Diagnosis not present

## 2024-01-04 DIAGNOSIS — I25810 Atherosclerosis of coronary artery bypass graft(s) without angina pectoris: Secondary | ICD-10-CM

## 2024-01-04 DIAGNOSIS — Z860101 Personal history of adenomatous and serrated colon polyps: Secondary | ICD-10-CM

## 2024-01-04 MED ORDER — NA SULFATE-K SULFATE-MG SULF 17.5-3.13-1.6 GM/177ML PO SOLN: 1.0000 | ORAL | 0 refills | Status: DC

## 2024-01-04 NOTE — Patient Instructions (Signed)
 _______________________________________________________  If your blood pressure at your visit was 140/90 or greater, please contact your primary care physician to follow up on this.  _______________________________________________________  If you are age 77 or older, your body mass index should be between 23-30. Your Body mass index is 33.22 kg/m. If this is out of the aforementioned range listed, please consider follow up with your Primary Care Provider.  If you are age 29 or younger, your body mass index should be between 19-25. Your Body mass index is 33.22 kg/m. If this is out of the aformentioned range listed, please consider follow up with your Primary Care Provider.   ________________________________________________________  The [facility] providers would like to encourage you to use MYCHART to communicate with providers for non-urgent requests or questions.  Due to long hold times on the telephone, sending your provider a message by [facility] may be a faster and more efficient way to get a response.  Please allow 48 business hours for a response.  Please remember that this is for non-urgent requests.  _______________________________________________________  Elene Griffes will need an echocardiogram. Someone will contact you to schedule this appointment.  Please call us  after you have this test completed so we can review.  You have been scheduled for an abdominal ultrasound with elastography at [facility] (1st floor of hospital) on 01-12-24 at 9:30am. Please arrive 15 minutes prior to your appointment for registration. Make certain not to have anything to eat or drink 6 hours prior to your appointment. Should you need to reschedule your appointment, please contact radiology at [phone]. This test typically takes about 30 minutes to perform.  You have been scheduled for a colonoscopy. Please follow written instructions given to you at your visit today.   If you use inhalers (even only  as needed), please bring them with you on the day of your procedure.  DO NOT TAKE 7 DAYS PRIOR TO TEST- Trulicity (dulaglutide) Ozempic, Wegovy (semaglutide) Mounjaro (tirzepatide) Bydureon Bcise (exanatide extended release)  DO NOT TAKE 1 DAY PRIOR TO YOUR TEST Rybelsus (semaglutide) Adlyxin (lixisenatide) Victoza (liraglutide) Byetta (exanatide) ___________________________________________________________________________  Due to recent changes in healthcare laws, you may see the results of your imaging and laboratory studies on MyChart before your provider has had a chance to review them.  We understand that in some cases there may be results that are confusing or concerning to you. Not all laboratory results come back in the same time frame and the provider may be waiting for multiple results in order to interpret others.  Please give us  48 hours in order for your provider to thoroughly review all the results before contacting the office for clarification of your results.   Thank you for entrusting me with your care and choosing [facility].  Santina Cull, PA-C

## 2024-01-10 ENCOUNTER — Ambulatory Visit (INDEPENDENT_AMBULATORY_CARE_PROVIDER_SITE_OTHER): Payer: Medicare (Managed Care) | Admitting: *Deleted

## 2024-01-10 ENCOUNTER — Ambulatory Visit: Payer: Medicare (Managed Care) | Admitting: Family

## 2024-01-10 VITALS — BP 138/72 | HR 83 | Temp 98.2°F | Resp 15 | Ht 62.0 in | Wt 178.6 lb

## 2024-01-10 DIAGNOSIS — E1121 Type 2 diabetes mellitus with diabetic nephropathy: Secondary | ICD-10-CM

## 2024-01-10 DIAGNOSIS — Z7984 Long term (current) use of oral hypoglycemic drugs: Secondary | ICD-10-CM | POA: Diagnosis not present

## 2024-01-10 DIAGNOSIS — L608 Other nail disorders: Secondary | ICD-10-CM

## 2024-01-10 DIAGNOSIS — Z0000 Encounter for general adult medical examination without abnormal findings: Secondary | ICD-10-CM | POA: Diagnosis not present

## 2024-01-10 NOTE — Progress Notes (Signed)
 Acute Office Visit  Subjective:     Patient ID: Antonio Wells, male    DOB: 05-Jan-1946, 78 y.o.   MRN: [id number]  Chief Complaint  Patient presents with  . Nail Problem    Pt notes an ingrown toenail he attempted to remove himself but it is still stuck, does not have a podiatrist and is looking for assistance, Rt Great toe     HPI Patient is in today with concerns that his right great toe is not healing as well as it normally does. Reports removing an ingrown toenail. The toe is better but has occasional pain with certain shoes or pressure on the nail. Has noticed over the past few years, his toenail curves inward more. No redness, drainage, discharge or swelling noted. He feels the toenail deformity is what has caused the discomfort at times. Has seen a podiatrist in the past that he was not impressed with. Would like a different provider.   Last A1c was 7.1. Has a recheck appt next month with PCP for chronic disease management.  Review of Systems  Constitutional: Negative.   Respiratory: Negative.    Cardiovascular: Negative.   Musculoskeletal: Negative.   Skin:        Curvature of the right great toe into the skin.   Psychiatric/Behavioral: Negative.    All other systems reviewed and are negative. Past Medical History:  Diagnosis Date  . Arthritis    knees (07/11/2017)  . CKD (chronic kidney disease), stage III (HCC)    lov dr Verdie Gladden [facility] 04-23-2020 on chart  . Colon polyp   . Complication of anesthesia    post op afibe after cabg x 2 2018  . Coronary artery disease    a.  CABGx3 in 05/2017.  Aaron Aas Gout   . Heart murmur    mild, saw dr Carolynne Citron 03-26-2020 f/u prn  . High cholesterol   . Hypertension   . Mild aortic stenosis   . Mild mitral regurgitation   . OSA on CPAP    last used cpap 3 weeks ago  . Phimosis   . Postoperative atrial fibrillation (HCC) 06/2017  . Sleep apnea    uses CPAP  . Type II diabetes mellitus (HCC)     Social History   Socioeconomic  History  . Marital status: Married    Spouse name: Stana Ear  . Number of children: 3  . Years of education: 16  . Highest education level: Bachelor's degree (e.g., BA, AB, BS)  Occupational History  . Occupation: Musician  Tobacco Use  . Smoking status: Former    Current packs/day: 0.00    Average packs/day: 2.0 packs/day for 15.0 years (30.0 ttl pk-yrs)    Types: Cigarettes    Start date: 07/27/1983    Quit date: 07/26/1998    Years since quitting: 25.4  . Smokeless tobacco: Never  Vaping Use  . Vaping status: Never Used  Substance and Sexual Activity  . Alcohol use: Not Currently    Alcohol/week: 0.0 standard drinks of alcohol  . Drug use: No  . Sexual activity: Yes  Other Topics Concern  . Not on file  Social History Narrative         Social Drivers of Health   Financial Resource Strain: Low Risk  (01/10/2024)   Overall Financial Resource Strain (CARDIA)   . Difficulty of Paying Living Expenses: Not hard at all  Food Insecurity: No Food Insecurity (01/10/2024)   Hunger Vital Sign   . Worried About  Running Out of Food in the Last Year: Never true   . Ran Out of Food in the Last Year: Never true  Transportation Needs: No Transportation Needs (01/10/2024)   PRAPARE - Transportation   . Lack of Transportation (Medical): No   . Lack of Transportation (Non-Medical): No  Physical Activity: Insufficiently Active (01/10/2024)   Exercise Vital Sign   . Days of Exercise per Week: 1 day   . Minutes of Exercise per Session: 10 min  Stress: No Stress Concern Present (01/10/2024)   [employer] of Occupational Health - Occupational Stress Questionnaire   . Feeling of Stress: Not at all  Social Connections: Moderately Integrated (01/10/2024)   Social Connection and Isolation Panel   . Frequency of Communication with Friends and Family: More than three times a week   . Frequency of Social Gatherings with Friends and Family: Twice a week   . Attends Religious Services: 1 to 4 times per  year   . Active Member of Clubs or Organizations: No   . Attends Banker Meetings: Not on file   . Marital Status: Married  Catering manager Violence: Not At Risk (01/10/2024)   Humiliation, Afraid, Rape, and Kick questionnaire   . Fear of Current or Ex-Partner: No   . Emotionally Abused: No   . Physically Abused: No   . Sexually Abused: No    Past Surgical History:  Procedure Laterality Date  . CARDIAC CATHETERIZATION    . CIRCUMCISION N/A 06/27/2020   Procedure: CIRCUMCISION ADULT;  Surgeon: Adelbert Homans, MD;  Location: [facility];  Service: Urology;  Laterality: N/A;  . COLONOSCOPY  2008  . CORONARY ARTERY BYPASS GRAFT N/A 06/24/2017   Procedure: CORONARY ARTERY BYPASS GRAFTING (CABG) x 3 ; Using Left Internal Mammary Artery and Right Great Saphenous Leg Vein Harvested Endoscopically;  Surgeon: Bartley Lightning, MD;  Location: [facility] OR;  Service: Open Heart Surgery;  Laterality: N/A;  . LEFT HEART CATH AND CORONARY ANGIOGRAPHY N/A 05/25/2017   Procedure: LEFT HEART CATH AND CORONARY ANGIOGRAPHY;  Surgeon: Wenona Hamilton, MD;  Location: [facility] INVASIVE CV LAB;  Service: Cardiovascular;  Laterality: N/A;  . TEE WITHOUT CARDIOVERSION N/A 06/24/2017   Procedure: TRANSESOPHAGEAL ECHOCARDIOGRAM (TEE);  Surgeon: Bartley Lightning, MD;  Location: [facility] OR;  Service: Open Heart Surgery;  Laterality: N/A;    Family History  Problem Relation Age of Onset  . Hypertension Mother   . Kidney disease Mother   . Diabetes Mother   . Hypertension Father   . Cancer Brother        lung  . Colon cancer Brother 4  . Stroke Brother   . Esophageal cancer Neg Hx   . Rectal cancer Neg Hx   . Stomach cancer Neg Hx   . Colon polyps Neg Hx     Allergies  Allergen Reactions  . Penicillins Other (See Comments)    Has patient had a PCN reaction causing immediate rash, facial/tongue/throat swelling, SOB or lightheadedness with hypotension: No Has patient had a PCN  reaction causing severe rash involving mucus membranes or skin necrosis: No Has patient had a PCN reaction that required hospitalization: No Has patient had a PCN reaction occurring within the last 10 years: No If all of the above answers are NO, then may proceed with Cephalosporin use.   Passed out ? SYNCOPE ?  Aaron Aas Lisinopril Cough  . Amiodarone  Nausea And Vomiting  . Amlodipine  Other (See Comments)    Causes dizzines  .  Beta Adrenergic Blockers     Do not take beta blockers causes heart block with coreg   . Oxycodone  Other (See Comments)    hallucinations  . Zofran  [Ondansetron  Hcl] Other (See Comments)    Has an interaction with another medication pt takes   . Cortisone Nausea And Vomiting and Other (See Comments)    HICCUPS  . Tramadol  Nausea And Vomiting and Other (See Comments)    HICCUPS    Current Outpatient Medications on File Prior to Visit  Medication Sig Dispense Refill  . acetaminophen  (TYLENOL ) 325 MG tablet Take 650 mg by mouth every 6 (six) hours as needed for mild pain.    . aspirin  EC 81 MG tablet Take 1 tablet (81 mg total) by mouth daily. 90 tablet 3  . atorvastatin  (LIPITOR ) 80 MG tablet TAKE 1 TABLET BY MOUTH DAILY AT  6 PM 90 tablet 1  . chlorthalidone  (HYGROTON ) 25 MG tablet Take 0.5 tablets (12.5 mg total) by mouth every morning. 50 tablet 2  . glucose blood test strip Use as instructed 100 each 12  . hydrALAZINE  (APRESOLINE ) 50 MG tablet TAKE 1 TABLET BY MOUTH IN THE  MORNING AND 2 TABLETS BY MOUTH  IN THE EVENING 90 tablet 11  . irbesartan  (AVAPRO ) 300 MG tablet Take 1 tablet (300 mg total) by mouth daily. TAKE 1 TABLET BY MOUTH DAILY 90 tablet 3  . isosorbide  mononitrate (IMDUR ) 60 MG 24 hr tablet Take 1 tablet (60 mg total) by mouth at bedtime. 90 tablet 1  . KERENDIA 10 MG TABS Take 1 tablet by mouth daily.    . Lancets (ONETOUCH ULTRASOFT) lancets 1 each by Other route 1 day or 1 dose for 1 dose. Use as instructed 100 each 12  . metFORMIN  (GLUCOPHAGE )  500 MG tablet Take 1 tablet (500 mg total) by mouth 2 (two) times daily with a meal. TAKE 1 TABLET BY MOUTH TWICE  DAILY WITH MEALS 60 tablet 0  . metFORMIN  (GLUCOPHAGE ) 500 MG tablet Take 1 tablet (500 mg total) by mouth 2 (two) times daily with a meal. TAKE 1 TABLET BY MOUTH TWICE  DAILY WITH MEALS 60 tablet 0  . Na Sulfate-K Sulfate-Mg Sulfate concentrate (SUPREP BOWEL PREP KIT) 17.5-3.13-1.6 GM/177ML SOLN Take 1 kit (354 mLs total) by mouth as directed. 324 mL 0  . sitaGLIPtin  (JANUVIA ) 50 MG tablet Take 1 tablet (50 mg total) by mouth daily. 100 tablet 2   No current facility-administered medications on file prior to visit.    BP 138/72   Pulse 83   Temp 98.2 F (36.8 C) (Temporal)   Resp 15   Ht 5' 2 (1.575 m)   Wt 178 lb 9.6 oz (81 kg)   SpO2 99%   BMI 32.67 kg/m chart      Objective:    BP 138/72   Pulse 83   Temp 98.2 F (36.8 C) (Temporal)   Resp 15   Ht 5' 2 (1.575 m)   Wt 178 lb 9.6 oz (81 kg)   SpO2 99%   BMI 32.67 kg/m    Physical Exam Vitals and nursing note reviewed.  Constitutional:      Appearance: Normal appearance.   Cardiovascular:     Rate and Rhythm: Normal rate and regular rhythm.  Pulmonary:     Effort: Pulmonary effort is normal.     Breath sounds: Normal breath sounds.   Skin:    General: Skin is warm and dry.     Comments: Right  great toe: Mild tenderness but no signs of infection. No drainage, discharge or swelling.    Neurological:     General: No focal deficit present.     Mental Status: He is alert and oriented to person, place, and time. Mental status is at baseline.   Psychiatric:        Mood and Affect: Mood normal.        Behavior: Behavior normal.   No results found for any visits on 01/10/24.      Assessment & Plan:   Problem List Items Addressed This Visit     DM (diabetes mellitus), type 2 with renal complications (HCC)   Relevant Orders   Ambulatory referral to Podiatry   Other Visit Diagnoses        Toenail deformity    -  Primary   Relevant Orders   Ambulatory referral to Podiatry      Discussed s/s of infection (redness, pain, drainage, swelling) and he will call if he has any issues. Referral placed for podiatry. Call the office if symptoms worsen or persist. Recheck as scheduled and sooner as needed.  No orders of the defined types were placed in this encounter.   No follow-ups on file.  Ravindra Baranek B Jalaysha Skilton, FNP

## 2024-01-10 NOTE — Patient Instructions (Signed)
 [name] , Thank you for taking time to come for your Medicare Wellness Visit. I appreciate your ongoing commitment to your health goals. Please review the following plan we discussed and let me know if I can assist you in the future.   Screening recommendations/referrals: Colonoscopy: scheduled Recommended yearly ophthalmology/optometry visit for glaucoma screening and checkup Recommended yearly dental visit for hygiene and checkup  Vaccinations: Influenza vaccine: up to date Pneumococcal vaccine:  Tdap vaccine: Education provided Shingles vaccine:        Preventive Care 65 Years and Older, Male Preventive care refers to lifestyle choices and visits with your health care provider that can promote health and wellness. What does preventive care include? A yearly physical exam. This is also called an annual well check. Dental exams once or twice a year. Routine eye exams. Ask your health care provider how often you should have your eyes checked. Personal lifestyle choices, including: Daily care of your teeth and gums. Regular physical activity. Eating a healthy diet. Avoiding tobacco and drug use. Limiting alcohol use. Practicing safe sex. Taking low doses of aspirin  every day. Taking vitamin and mineral supplements as recommended by your health care provider. What happens during an annual well check? The services and screenings done by your health care provider during your annual well check will depend on your age, overall health, lifestyle risk factors, and family history of disease. Counseling  Your health care provider may ask you questions about your: Alcohol use. Tobacco use. Drug use. Emotional well-being. Home and relationship well-being. Sexual activity. Eating habits. History of falls. Memory and ability to understand (cognition). Work and work Astronomer. Screening  You may have the following tests or measurements: Height, weight, and BMI. Blood  pressure. Lipid and cholesterol levels. These may be checked every 5 years, or more frequently if you are over 63 years old. Skin check. Lung cancer screening. You may have this screening every year starting at age 65 if you have a 30-pack-year history of smoking and currently smoke or have quit within the past 15 years. Fecal occult blood test (FOBT) of the stool. You may have this test every year starting at age 67. Flexible sigmoidoscopy or colonoscopy. You may have a sigmoidoscopy every 5 years or a colonoscopy every 10 years starting at age 98. Prostate cancer screening. Recommendations will vary depending on your family history and other risks. Hepatitis C blood test. Hepatitis B blood test. Sexually transmitted disease (STD) testing. Diabetes screening. This is done by checking your blood sugar (glucose) after you have not eaten for a while (fasting). You may have this done every 1-3 years. Abdominal aortic aneurysm (AAA) screening. You may need this if you are a current or former smoker. Osteoporosis. You may be screened starting at age 18 if you are at high risk. Talk with your health care provider about your test results, treatment options, and if necessary, the need for more tests. Vaccines  Your health care provider may recommend certain vaccines, such as: Influenza vaccine. This is recommended every year. Tetanus, diphtheria, and acellular pertussis (Tdap, Td) vaccine. You may need a Td booster every 10 years. Zoster vaccine. You may need this after age 88. Pneumococcal 13-valent conjugate (PCV13) vaccine. One dose is recommended after age 60. Pneumococcal polysaccharide (PPSV23) vaccine. One dose is recommended after age 86. Talk to your health care provider about which screenings and vaccines you need and how often you need them. This information is not intended to replace advice given to you  by your health care provider. Make sure you discuss any questions you have with your  health care provider. Document Released: 08/08/2015 Document Revised: 03/31/2016 Document Reviewed: 05/13/2015 Elsevier Interactive Patient Education  2017 [employer].  Fall Prevention in the Home Falls can cause injuries. They can happen to people of all ages. There are many things you can do to make your home safe and to help prevent falls. What can I do on the outside of my home? Regularly fix the edges of walkways and driveways and fix any cracks. Remove anything that might make you trip as you walk through a door, such as a raised step or threshold. Trim any bushes or trees on the path to your home. Use bright outdoor lighting. Clear any walking paths of anything that might make someone trip, such as rocks or tools. Regularly check to see if handrails are loose or broken. Make sure that both sides of any steps have handrails. Any raised decks and porches should have guardrails on the edges. Have any leaves, snow, or ice cleared regularly. Use sand or salt on walking paths during winter. Clean up any spills in your garage right away. This includes oil or grease spills. What can I do in the bathroom? Use night lights. Install grab bars by the toilet and in the tub and shower. Do not use towel bars as grab bars. Use non-skid mats or decals in the tub or shower. If you need to sit down in the shower, use a plastic, non-slip stool. Keep the floor dry. Clean up any water that spills on the floor as soon as it happens. Remove soap buildup in the tub or shower regularly. Attach bath mats securely with double-sided non-slip rug tape. Do not have throw rugs and other things on the floor that can make you trip. What can I do in the bedroom? Use night lights. Make sure that you have a light by your bed that is easy to reach. Do not use any sheets or blankets that are too big for your bed. They should not hang down onto the floor. Have a firm chair that has side arms. You can use this for  support while you get dressed. Do not have throw rugs and other things on the floor that can make you trip. What can I do in the kitchen? Clean up any spills right away. Avoid walking on wet floors. Keep items that you use a lot in easy-to-reach places. If you need to reach something above you, use a strong step stool that has a grab bar. Keep electrical cords out of the way. Do not use floor polish or wax that makes floors slippery. If you must use wax, use non-skid floor wax. Do not have throw rugs and other things on the floor that can make you trip. What can I do with my stairs? Do not leave any items on the stairs. Make sure that there are handrails on both sides of the stairs and use them. Fix handrails that are broken or loose. Make sure that handrails are as long as the stairways. Check any carpeting to make sure that it is firmly attached to the stairs. Fix any carpet that is loose or worn. Avoid having throw rugs at the top or bottom of the stairs. If you do have throw rugs, attach them to the floor with carpet tape. Make sure that you have a light switch at the top of the stairs and the bottom of the stairs. If  you do not have them, ask someone to add them for you. What else can I do to help prevent falls? Wear shoes that: Do not have high heels. Have rubber bottoms. Are comfortable and fit you well. Are closed at the toe. Do not wear sandals. If you use a stepladder: Make sure that it is fully opened. Do not climb a closed stepladder. Make sure that both sides of the stepladder are locked into place. Ask someone to hold it for you, if possible. Clearly mark and make sure that you can see: Any grab bars or handrails. First and last steps. Where the edge of each step is. Use tools that help you move around (mobility aids) if they are needed. These include: Canes. Walkers. Scooters. Crutches. Turn on the lights when you go into a dark area. Replace any light bulbs as soon  as they burn out. Set up your furniture so you have a clear path. Avoid moving your furniture around. If any of your floors are uneven, fix them. If there are any pets around you, be aware of where they are. Review your medicines with your doctor. Some medicines can make you feel dizzy. This can increase your chance of falling. Ask your doctor what other things that you can do to help prevent falls. This information is not intended to replace advice given to you by your health care provider. Make sure you discuss any questions you have with your health care provider. Document Released: 05/08/2009 Document Revised: 12/18/2015 Document Reviewed: 08/16/2014 Elsevier Interactive Patient Education  2017 [employer].

## 2024-01-10 NOTE — Progress Notes (Signed)
 Subjective:   Antonio Wells is a 78 y.o. male who presents for Medicare Annual/Subsequent preventive examination.  Visit Complete: Virtual I connected with  Antonio Wells on 01/10/24 by a audio enabled telemedicine application and verified that I am speaking with the correct person using two identifiers.  Patient Location: Home  Provider Location: Home Office  I discussed the limitations of evaluation and management by telemedicine. The patient expressed understanding and agreed to proceed.  Vital Signs: Because this visit was a virtual/telehealth visit, some criteria may be missing or patient reported. Any vitals not documented were not able to be obtained and vitals that have been documented are patient reported.  Patient Medicare AWV questionnaire was completed by the patient on 01-06-2024; I have confirmed that all information answered by patient is correct and no changes since this date.  Cardiac Risk Factors include: advanced age (>75men, >36 women);male gender;diabetes mellitus;obesity (BMI >30kg/m2)     Objective:    There were no vitals filed for this visit. There is no height or weight on file to calculate BMI.     01/10/2024    9:42 AM 01/06/2023    1:37 PM 06/27/2020   11:03 AM 08/27/2019   11:08 AM 10/20/2018    6:00 AM 10/19/2018    2:45 PM 07/11/2017    9:07 PM  Advanced Directives  Does Patient Have a Medical Advance Directive? No No No No No  No  No   Would patient like information on creating a medical advance directive? No - Patient declined No - Patient declined No - Patient declined Yes (Inpatient - patient defers creating a medical advance directive at this time - Information given) No - Patient declined   No - Patient declined      Data saved with a previous flowsheet row definition    Current Medications (verified) Outpatient Encounter Medications as of 01/10/2024  Medication Sig   acetaminophen  (TYLENOL ) 325 MG tablet Take 650 mg by mouth every 6  (six) hours as needed for mild pain.   aspirin  EC 81 MG tablet Take 1 tablet (81 mg total) by mouth daily.   atorvastatin  (LIPITOR ) 80 MG tablet TAKE 1 TABLET BY MOUTH DAILY AT  6 PM   chlorthalidone  (HYGROTON ) 25 MG tablet Take 0.5 tablets (12.5 mg total) by mouth every morning.   glucose blood test strip Use as instructed   hydrALAZINE  (APRESOLINE ) 50 MG tablet TAKE 1 TABLET BY MOUTH IN THE  MORNING AND 2 TABLETS BY MOUTH  IN THE EVENING   irbesartan  (AVAPRO ) 300 MG tablet Take 1 tablet (300 mg total) by mouth daily. TAKE 1 TABLET BY MOUTH DAILY   isosorbide  mononitrate (IMDUR ) 60 MG 24 hr tablet Take 1 tablet (60 mg total) by mouth at bedtime.   KERENDIA 10 MG TABS Take 1 tablet by mouth daily.   Lancets (ONETOUCH ULTRASOFT) lancets 1 each by Other route 1 day or 1 dose for 1 dose. Use as instructed   metFORMIN  (GLUCOPHAGE ) 500 MG tablet Take 1 tablet (500 mg total) by mouth 2 (two) times daily with a meal. TAKE 1 TABLET BY MOUTH TWICE  DAILY WITH MEALS   Na Sulfate-K Sulfate-Mg Sulfate concentrate (SUPREP BOWEL PREP KIT) 17.5-3.13-1.6 GM/177ML SOLN Take 1 kit (354 mLs total) by mouth as directed.   sitaGLIPtin  (JANUVIA ) 50 MG tablet Take 1 tablet (50 mg total) by mouth daily.   metFORMIN  (GLUCOPHAGE ) 500 MG tablet Take 1 tablet (500 mg total) by mouth 2 (two) times  daily with a meal. TAKE 1 TABLET BY MOUTH TWICE  DAILY WITH MEALS   No facility-administered encounter medications on file as of 01/10/2024.    Allergies (verified) Penicillins, Lisinopril, Amiodarone , Amlodipine , Beta adrenergic blockers, Oxycodone , Zofran  [ondansetron  hcl], Cortisone, and Tramadol    History: Past Medical History:  Diagnosis Date   Arthritis    knees (07/11/2017)   CKD (chronic kidney disease), stage III (HCC)    lov dr Verdie Gladden sanford 04-23-2020 on chart   Colon polyp    Complication of anesthesia    post op afibe after cabg x 2 2018   Coronary artery disease    a.  CABGx3 in 05/2017.   Gout    Heart  murmur    mild, saw dr Carolynne Citron 03-26-2020 f/u prn   High cholesterol    Hypertension    Mild aortic stenosis    Mild mitral regurgitation    OSA on CPAP    last used cpap 3 weeks ago   Phimosis    Postoperative atrial fibrillation (HCC) 06/2017   Sleep apnea    uses CPAP   Type II diabetes mellitus [facility])    Past Surgical History:  Procedure Laterality Date   CARDIAC CATHETERIZATION     CIRCUMCISION N/A 06/27/2020   Procedure: CIRCUMCISION ADULT;  Surgeon: Adelbert Homans, MD;  Location: [facility];  Service: Urology;  Laterality: N/A;   COLONOSCOPY  2008   CORONARY ARTERY BYPASS GRAFT N/A 06/24/2017   Procedure: CORONARY ARTERY BYPASS GRAFTING (CABG) x 3 ; Using Left Internal Mammary Artery and Right Great Saphenous Leg Vein Harvested Endoscopically;  Surgeon: Bartley Lightning, MD;  Location: [facility] OR;  Service: Open Heart Surgery;  Laterality: N/A;   LEFT HEART CATH AND CORONARY ANGIOGRAPHY N/A 05/25/2017   Procedure: LEFT HEART CATH AND CORONARY ANGIOGRAPHY;  Surgeon: Wenona Hamilton, MD;  Location: [facility] INVASIVE CV LAB;  Service: Cardiovascular;  Laterality: N/A;   TEE WITHOUT CARDIOVERSION N/A 06/24/2017   Procedure: TRANSESOPHAGEAL ECHOCARDIOGRAM (TEE);  Surgeon: Bartley Lightning, MD;  Location: [facility] OR;  Service: Open Heart Surgery;  Laterality: N/A;   Family History  Problem Relation Age of Onset   Hypertension Mother    Kidney disease Mother    Diabetes Mother    Hypertension Father    Cancer Brother        lung   Colon cancer Brother 71   Stroke Brother    Esophageal cancer Neg Hx    Rectal cancer Neg Hx    Stomach cancer Neg Hx    Colon polyps Neg Hx    Social History   Socioeconomic History   Marital status: Married    Spouse name: Antonio Wells   Number of children: 3   Years of education: 16   Highest education level: Bachelor's degree (e.g., BA, AB, BS)  Occupational History   Occupation: Musician  Tobacco Use   Smoking status: Former     Current packs/day: 0.00    Average packs/day: 2.0 packs/day for 15.0 years (30.0 ttl pk-yrs)    Types: Cigarettes    Start date: 07/27/1983    Quit date: 07/26/1998    Years since quitting: 25.4   Smokeless tobacco: Never  Vaping Use   Vaping status: Never Used  Substance and Sexual Activity   Alcohol use: Not Currently    Alcohol/week: 0.0 standard drinks of alcohol   Drug use: No   Sexual activity: Yes  Other Topics Concern   Not on file  Social  History Narrative         Social Drivers of Corporate investment banker Strain: Low Risk  (01/10/2024)   Overall Financial Resource Strain (CARDIA)    Difficulty of Paying Living Expenses: Not hard at all  Food Insecurity: No Food Insecurity (01/10/2024)   Hunger Vital Sign    Worried About Running Out of Food in the Last Year: Never true    Ran Out of Food in the Last Year: Never true  Transportation Needs: No Transportation Needs (01/10/2024)   PRAPARE - Administrator, Civil Service (Medical): No    Lack of Transportation (Non-Medical): No  Physical Activity: Insufficiently Active (01/10/2024)   Exercise Vital Sign    Days of Exercise per Week: 1 day    Minutes of Exercise per Session: 10 min  Stress: No Stress Concern Present (01/10/2024)   [employer] of Occupational Health - Occupational Stress Questionnaire    Feeling of Stress: Not at all  Social Connections: Moderately Integrated (01/10/2024)   Social Connection and Isolation Panel    Frequency of Communication with Friends and Family: More than three times a week    Frequency of Social Gatherings with Friends and Family: Twice a week    Attends Religious Services: 1 to 4 times per year    Active Member of [employer] or Organizations: No    Attends Engineer, structural: Not on file    Marital Status: Married    Tobacco Counseling Counseling given: Not Answered   Clinical Intake:  Pre-visit preparation completed: Yes  Pain : No/denies pain      Diabetes: Yes CBG done?: No Did pt. bring in CBG monitor from home?: No  How often do you need to have someone help you when you read instructions, pamphlets, or other written materials from your doctor or pharmacy?: 1 - Never  Interpreter Needed?: No  Information entered by :: Kieth Pelt LPN   Activities of Daily Living    01/10/2024    9:44 AM 01/06/2024   10:59 AM  In your present state of health, do you have any difficulty performing the following activities:  Hearing? 0 0  Vision? 0 0  Difficulty concentrating or making decisions? 0 0  Walking or climbing stairs? 0 0  Dressing or bathing? 0 0  Doing errands, shopping? 0 0  Preparing Food and eating ? N N  Using the Toilet? N N  In the past six months, have you accidently leaked urine? N N  Do you have problems with loss of bowel control? N N  Managing your Medications? N N  Managing your Finances? N N  Housekeeping or managing your Housekeeping? N N    Patient Care Team: Benjiman Bras, MD as PCP - General (Family Medicine) Hugh Madura, MD as PCP - Cardiology (Cardiology) Liza Riggers, MD as Consulting Physician (Cardiology) Charletta Cons, MD as Attending Physician (Nephrology)  Indicate any recent Medical Services you may have received from other than [facility] providers in the past year (date may be approximate).     Assessment:   This is a routine wellness examination for [place].  Hearing/Vision screen Hearing Screening - Comments:: No trouble hearing Vision Screening - Comments:: [facility] Up to date    Goals Addressed             This Visit's Progress    Increase physical activity         Depression Screen  01/10/2024    9:45 AM 08/22/2023    2:49 PM 01/06/2023    1:42 PM 07/15/2022   11:45 AM 05/05/2022    9:43 AM 02/18/2022   11:20 AM 02/01/2022   10:00 AM  PHQ 2/9 Scores  PHQ - 2 Score 0 0 0 0 0 0 0  PHQ- 9 Score 0 0 0 0 1      Fall Risk    01/10/2024    9:44 AM  01/06/2024   10:59 AM 08/22/2023    2:49 PM 01/06/2023    1:38 PM 01/03/2023    3:58 PM  Fall Risk   Falls in the past year? 1 1 0 0 0  Number falls in past yr: 0 0 0 0 0  Injury with Fall? 1 1 0 0 0  Risk for fall due to :   No Fall Risks    Follow up Falls evaluation completed;Education provided;Falls prevention discussed  Falls evaluation completed Falls evaluation completed;Education provided;Falls prevention discussed     MEDICARE RISK AT HOME: Medicare Risk at Home Any stairs in or around the home?: Yes If so, are there any without handrails?: No Home free of loose throw rugs in walkways, pet beds, electrical cords, etc?: Yes Adequate lighting in your home to reduce risk of falls?: Yes Life alert?: No Use of a cane, walker or w/c?: No Grab bars in the bathroom?: Yes Shower chair or bench in shower?: No Elevated toilet seat or a handicapped toilet?: No  TIMED UP AND GO:  Was the test performed?  No    Cognitive Function:        01/10/2024    9:43 AM 01/06/2023    1:39 PM 02/18/2022   11:17 AM 08/27/2019   11:05 AM 03/29/2017   10:49 AM  6CIT Screen  What Year? 0 points 0 points 0 points 0 points 0 points  What month? 0 points 0 points 0 points 0 points 0 points  What time? 0 points 0 points 0 points 0 points 0 points  Count back from 20 0 points 0 points 0 points 0 points 0 points  Months in reverse 0 points 0 points 0 points 0 points 0 points  Repeat phrase 0 points 0 points 0 points 0 points 0 points  Total Score 0 points 0 points 0 points 0 points 0 points    Immunizations Immunization History  Administered Date(s) Administered   Fluad Quad(high Dose 65+) 04/07/2022   Influenza, High Dose Seasonal PF 03/20/2019   Influenza, Seasonal, Injecte, Preservative Fre 07/03/2012   Influenza,inj,Quad PF,6+ Mos 08/25/2015, 05/12/2017, 05/29/2018   Influenza-Unspecified 07/24/2016, 04/23/2020, 05/05/2021, 08/20/2022, 04/15/2023   PFIZER(Purple Top)SARS-COV-2 Vaccination  08/31/2019, 09/21/2019, 04/02/2020, 12/04/2020, 04/16/2021   Pneumococcal Polysaccharide-23 07/03/2012   Tdap 07/03/2012   Unspecified SARS-COV-2 Vaccination 03/30/2023    TDAP status: Due, Education has been provided regarding the importance of this vaccine. Advised may receive this vaccine at local pharmacy or Health Dept. Aware to provide a copy of the vaccination record if obtained from local pharmacy or Health Dept. Verbalized acceptance and understanding.  Flu Vaccine status: Up to date  Pneumococcal vaccine status: Declined,  Education has been provided regarding the importance of this vaccine but patient still declined. Advised may receive this vaccine at local pharmacy or Health Dept. Aware to provide a copy of the vaccination record if obtained from local pharmacy or Health Dept. Verbalized acceptance and understanding.   Covid-19 vaccine status: Information provided on how to obtain vaccines.  Qualifies for Shingles Vaccine? Yes   Zostavax completed No   Shingrix Completed?: No.    Education has been provided regarding the importance of this vaccine. Patient has been advised to call insurance company to determine out of pocket expense if they have not yet received this vaccine. Advised may also receive vaccine at local pharmacy or Health Dept. Verbalized acceptance and understanding.  Screening Tests Health Maintenance  Topic Date Due   COVID-19 Vaccine (9 - 2024-25 season) 09/27/2023   Colonoscopy  12/17/2023   Zoster Vaccines- Shingrix (1 of 2) 04/11/2024 (Originally 01/12/1997)   Pneumococcal Vaccine: 50+ Years (2 of 2 - PCV) 01/09/2025 (Originally 07/03/2013)   HEMOGLOBIN A1C  02/19/2024   INFLUENZA VACCINE  02/24/2024   Diabetic kidney evaluation - eGFR measurement  08/21/2024   Diabetic kidney evaluation - Urine ACR  08/21/2024   FOOT EXAM  08/21/2024   OPHTHALMOLOGY EXAM  11/30/2024   Medicare Annual Wellness (AWV)  01/09/2025   Hepatitis C Screening  Completed   HPV  VACCINES  Aged Out   Meningococcal B Vaccine  Aged Out   DTaP/Tdap/Td  Discontinued    Health Maintenance  Health Maintenance Due  Topic Date Due   COVID-19 Vaccine (9 - 2024-25 season) 09/27/2023   Colonoscopy  12/17/2023    Colonoscopy  patient is schedule    Education provided  Lung Cancer Screening: (Low Dose CT Chest recommended if Age 15-80 years, 20 pack-year currently smoking OR have quit w/in 15years.) does not qualify.   Lung Cancer Screening Referral:   Additional Screening:  Hepatitis C Screening: Completed 2017  Vision Screening: Recommended annual ophthalmology exams for early detection of glaucoma and other disorders of the eye. Is the patient up to date with their annual eye exam?  Yes  Who is the provider or what is the name of the office in which the patient attends annual eye exams? [facility] If pt is not established with a provider, would they like to be referred to a provider to establish care? No .   Dental Screening: Recommended annual dental exams for proper oral hygiene  Nutrition Risk Assessment:  Has the patient had any N/V/D within the last 2 months?  No  Does the patient have any non-healing wounds?  No  Has the patient had any unintentional weight loss or weight gain?  Patient has ingrown toenail , scheduled an appointment to be seen two weeks.  Diabetes:  Is the patient diabetic?  Yes  If diabetic, was a CBG obtained today?  No  Did the patient bring in their glucometer from home?  No  How often do you monitor your CBG's? 1 x a day.   Financial Strains and Diabetes Management:  Are you having any financial strains with the device, your supplies or your medication? No .  Does the patient want to be seen by Chronic Care Management for management of their diabetes?  No  Would the patient like to be referred to a Nutritionist or for Diabetic Management?  No   Diabetic Exams:  Diabetic Eye Exam: Completed  Pt has been advised about the  importance in completing this exam.  Diabetic Foot Exam: Pt has been advised about the importance in completing this exam.   Community Resource Referral / Chronic Care Management: CRR required this visit?  No   CCM required this visit?  No     Plan:     I have personally reviewed and noted the following in the patient's  chart:   Medical and social history Use of alcohol, tobacco or illicit drugs  Current medications and supplements including opioid prescriptions. Patient is not currently taking opioid prescriptions. Functional ability and status Nutritional status Physical activity Advanced directives List of other physicians Hospitalizations, surgeries, and ER visits in previous 12 months Vitals Screenings to include cognitive, depression, and falls Referrals and appointments  In addition, I have reviewed and discussed with patient certain preventive protocols, quality metrics, and best practice recommendations. A written personalized care plan for preventive services as well as general preventive health recommendations were provided to patient.     Kieth Pelt, LPN   1/32/4401   After Visit Summary: (MyChart) Due to this being a telephonic visit, the after visit summary with patients personalized plan was offered to patient via MyChart   Nurse Notes:

## 2024-01-12 ENCOUNTER — Ambulatory Visit (HOSPITAL_COMMUNITY)
Admission: RE | Admit: 2024-01-12 | Discharge: 2024-01-12 | Disposition: A | Discharge status: Home / Self Care | Payer: Medicare (Managed Care) | Source: Ambulatory Visit | Attending: Physician Assistant | Admitting: Physician Assistant

## 2024-01-12 DIAGNOSIS — K760 Fatty (change of) liver, not elsewhere classified: Secondary | ICD-10-CM | POA: Insufficient documentation

## 2024-01-18 ENCOUNTER — Ambulatory Visit: Payer: Medicare (Managed Care) | Admitting: Podiatry

## 2024-01-18 ENCOUNTER — Encounter: Payer: Self-pay | Admitting: Podiatry

## 2024-01-18 DIAGNOSIS — L600 Ingrowing nail: Secondary | ICD-10-CM

## 2024-01-18 DIAGNOSIS — B351 Tinea unguium: Secondary | ICD-10-CM

## 2024-01-18 NOTE — Progress Notes (Signed)
 Subjective:   Patient ID: Antonio Wells, male   DOB: 78 y.o.   MRN: [id number]   HPI Patient presents with a very painful right big toenail that has been making it hard to wear shoe gear and is deformed.  Patient also has some thickness of other nailbeds does have diabetes under good control A1c under 7.  Patient does not smoke likes to be active   Review of Systems  All other systems reviewed and are negative.       Objective:  Physical Exam Vitals and nursing note reviewed.  Constitutional:      Appearance: He is well-developed.  Pulmonary:     Effort: Pulmonary effort is normal.   Musculoskeletal:        General: Normal range of motion.   Skin:    General: Skin is warm.   Neurological:     Mental Status: He is alert.     Neurovascular status found to be intact muscle strength found to be adequate range of motion within normal limits.  Patient is noted to have incurvated hallux nail right over left with damage to the nailbed and pain on the medial corner no redness no active drainage noted no erythema.  Patient is found to have good digital perfusion well-oriented x 3     Assessment:  Ingrown toenail deformity right hallux medial border with patient who also has probable nail fungus and probable trauma     Plan:  H&P reviewed and I have recommended removal of the nail border and I explained procedure risk and patient wants this fixed and read then signed consent form.  I infiltrated the right big toe 60 mg Xylocaine  Marcaine  mixture sterile prep done using sterile instrumentation remove the medial border exposed matrix applied phenol 3 applications 30 seconds followed by alcohol lavage sterile dressing gave instructions on soaks wear dressing 24 hours take it off earlier if throbbing were to occur and encouraged questions during the recovery process

## 2024-01-18 NOTE — Patient Instructions (Signed)

## 2024-01-23 ENCOUNTER — Ambulatory Visit: Payer: Self-pay | Admitting: Physician Assistant

## 2024-01-23 ENCOUNTER — Encounter: Payer: Self-pay | Admitting: Podiatry

## 2024-01-25 ENCOUNTER — Ambulatory Visit (HOSPITAL_COMMUNITY)
Admission: RE | Admit: 2024-01-25 | Discharge: 2024-01-25 | Disposition: A | Discharge status: Home / Self Care | Payer: Medicare (Managed Care) | Source: Ambulatory Visit | Attending: Physician Assistant | Admitting: Physician Assistant

## 2024-01-25 DIAGNOSIS — I10 Essential (primary) hypertension: Secondary | ICD-10-CM | POA: Diagnosis not present

## 2024-01-25 DIAGNOSIS — I350 Nonrheumatic aortic (valve) stenosis: Secondary | ICD-10-CM | POA: Insufficient documentation

## 2024-01-25 DIAGNOSIS — R0600 Dyspnea, unspecified: Secondary | ICD-10-CM | POA: Diagnosis not present

## 2024-01-25 DIAGNOSIS — I371 Nonrheumatic pulmonary valve insufficiency: Secondary | ICD-10-CM | POA: Insufficient documentation

## 2024-01-25 DIAGNOSIS — R0609 Other forms of dyspnea: Secondary | ICD-10-CM

## 2024-01-25 DIAGNOSIS — Z006 Encounter for examination for normal comparison and control in clinical research program: Secondary | ICD-10-CM

## 2024-01-25 LAB — ECHOCARDIOGRAM COMPLETE
AR max vel: 0.72 cm2
AV Area VTI: 0.66 cm2
AV Area mean vel: 0.71 cm2
AV Mean grad: 17 mmHg
AV Peak grad: 29.2 mmHg
Ao pk vel: 2.7 m/s
Area-P 1/2: 3.93 cm2
Calc EF: 50.9 %
S' Lateral: 2.8 cm
Single Plane A2C EF: 54.4 %
Single Plane A4C EF: 47.2 %

## 2024-01-25 NOTE — Research (Signed)
 SITE: [id number]     Subject # [id number]    Subprotocol: A  Inclusion Criteria  Patients who meet all of the following criteria are eligible for enrollment as study participants:  Yes No  Age > 77 years old X   Eligible to wear Holter Study X    Exclusion Criteria  Patients who meet any of these criteria are not eligible for enrollment as study participants: Yes No  1. Receiving any mechanical (respiratory or circulatory) or renal support therapy at Screening or during Visit #1.  X  2.  Any other conditions that in the opinion of the investigators are likely to prevent compliance with the study protocol or pose a safety concern if the subject participates in the study.  X  3. Poor tolerance, namely susceptible to severe skin allergies from ECG adhesive patch application.  X   Protocol: REV H    60 minute start window         Cor device must be applied, and the study initiated, no later than 60 minutes of completing the Echocardiogram                             HH:MM  Echo completion time  14:57  2.   Cor Study start time  15:08   30-Minute execution window  Once Cor Monitoring begins, 3 QT Med ECGs and the 15-minute rest period must be completed within a 30 minute window     HH:MM  3. QT Med ECG Completion time  15:38  4. Start of 15-Min sitting rest period  15:18  5. End of 15-Min rest period  15:33  6. Time of device removal  15:44   *Continue to use the Mobile App Event feature to log the Rest period windows and follow instructions on the EF-ACT Clinical Trial  Patient Instruction Card.  Describe any anomalies in Protocol execution in the Protocol Deviation Log    Residential Zip code [address] (First 3 digits ONLY)                                           [facility] Informed Consent   Subject Name: Antonio Wells  Subject met inclusion and exclusion criteria.  The informed consent form, study requirements and expectations were reviewed with the subject. Subject had opportunity  to read consent and questions and concerns were addressed prior to the signing of the consent form.  The subject verbalized understanding of the trial requirements.  The subject agreed to participate in the [facility] EF ACT trial and signed the informed consent at 15:07 on 25-Jan-2024.  The informed consent was obtained prior to performance of any protocol-specific procedures for the subject.  A copy of the signed informed consent was given to the subject and a copy was placed in the subject's medical record.   Dorthea LITTIE Louder          Current Outpatient Medications:    acetaminophen  (TYLENOL ) 325 MG tablet, Take 650 mg by mouth every 6 (six) hours as needed for mild pain., Disp: , Rfl:    aspirin  EC 81 MG tablet, Take 1 tablet (81 mg total) by mouth daily., Disp: 90 tablet, Rfl: 3   atorvastatin  (LIPITOR ) 80 MG tablet, TAKE 1 TABLET BY MOUTH DAILY AT  6 PM, Disp: 90 tablet, Rfl: 1  chlorthalidone  (HYGROTON ) 25 MG tablet, Take 0.5 tablets (12.5 mg total) by mouth every morning., Disp: 50 tablet, Rfl: 2   glucose blood test strip, Use as instructed, Disp: 100 each, Rfl: 12   hydrALAZINE  (APRESOLINE ) 50 MG tablet, TAKE 1 TABLET BY MOUTH IN THE  MORNING AND 2 TABLETS BY MOUTH  IN THE EVENING, Disp: 90 tablet, Rfl: 11   irbesartan  (AVAPRO ) 300 MG tablet, Take 1 tablet (300 mg total) by mouth daily. TAKE 1 TABLET BY MOUTH DAILY, Disp: 90 tablet, Rfl: 3   isosorbide  mononitrate (IMDUR ) 60 MG 24 hr tablet, Take 1 tablet (60 mg total) by mouth at bedtime., Disp: 90 tablet, Rfl: 1   KERENDIA 10 MG TABS, Take 1 tablet by mouth daily., Disp: , Rfl:    Lancets (ONETOUCH ULTRASOFT) lancets, 1 each by Other route 1 day or 1 dose for 1 dose. Use as instructed, Disp: 100 each, Rfl: 12   metFORMIN  (GLUCOPHAGE ) 500 MG tablet, Take 1 tablet (500 mg total) by mouth 2 (two) times daily with a meal. TAKE 1 TABLET BY MOUTH TWICE  DAILY WITH MEALS, Disp: 60 tablet, Rfl: 0   Na Sulfate-K Sulfate-Mg Sulfate concentrate  (SUPREP BOWEL PREP KIT) 17.5-3.13-1.6 GM/177ML SOLN, Take 1 kit (354 mLs total) by mouth as directed., Disp: 324 mL, Rfl: 0   sitaGLIPtin  (JANUVIA ) 50 MG tablet, Take 1 tablet (50 mg total) by mouth daily., Disp: 100 tablet, Rfl: 2

## 2024-01-26 ENCOUNTER — Telehealth: Payer: Self-pay

## 2024-01-26 NOTE — Telephone Encounter (Signed)
 [facility] Pre-operative Risk Assessment     Request for surgical clearance:     Endoscopy Procedure  What type of surgery is being performed?     Colonoscopy  When is this surgery scheduled?     03/20/2024  What type of clearance is required ?   Medical Clearance - see recent echocardiogram Are there any medications that need to be held prior to surgery and how long? N/A  Practice name and name of physician performing surgery?      [facility]  What is your office phone and fax number?      Phone- [phone]  Fax- [phone]  Anesthesia type (None, local, MAC, general) ?       MAC     Please route your response to [place] B, RN

## 2024-01-26 NOTE — Telephone Encounter (Signed)
-----   Message from Alan JONELLE Coombs sent at 01/26/2024 11:08 AM EDT ----- Regarding: FW: Please evaluate Echo Please get cardiac clearance for colonoscopy at [facility] Thanks, Alan  ----- Message ----- From: Dyan Rush, CRNA Sent: 01/26/2024  10:59 AM EDT To: Alan JONELLE Coombs, PA-C; Sandor San GAILS, DO Subject: RE: Please evaluate Echo                        Alan,   Due to his aortic stenosis please request a cardiac clearance.   Thanks much,   Rush Dyan ----- Message ----- From: Coombs Alan JONELLE DEVONNA Sent: 01/26/2024   8:03 AM EDT To: Rush Dyan, CRNA; Vito Cirigliano V, DO Subject: Please evaluate Echo                            Patient wants to proceed with colonoscopy but had mild to moderate aortic stenosis 2 years ago so I ordered echocardiogram. This reads as moderate aortic stenosis, the aortic ejection velocity is greater than 4 however the AVA is 0.66 cm. Has some mild dyspnea and swelling no chest pain overall well. Would we be able to proceed at the [facility] with cardiac clearance or should he be scheduled at the hospital? Thanks, Alan  ----- Message ----- From: Interface, [employer] Sent: 01/25/2024   5:06 PM EDT To: Alan JONELLE Coombs, PA-C

## 2024-01-26 NOTE — Telephone Encounter (Signed)
See 7/3 TE

## 2024-01-26 NOTE — Telephone Encounter (Signed)
   Name: Antonio Wells  DOB: 10-Apr-1946  MRN: [id number]  Primary Cardiologist: Oneil Parchment, MD  Chart reviewed as part of pre-operative protocol coverage. Because of Antonio Wells's past medical history and time since last visit, he will require a follow-up in-office visit in order to better assess preoperative cardiovascular risk.  Patient had reported mild dyspnea and swelling during office visit on 6/11 with gastroenterology.  Recent echocardiogram showed moderate aortic stenosis.  Needs OV for further assessment.  Pre-op covering staff: - Please schedule appointment and call patient to inform them. If patient already had an upcoming appointment within acceptable timeframe, please add pre-op clearance to the appointment notes so provider is aware. - Please contact requesting surgeon's office via preferred method (i.e, phone, fax) to inform them of need for appointment prior to surgery.   Antonio LITTIE Louis, NP  01/26/2024, 11:56 AM

## 2024-01-26 NOTE — Telephone Encounter (Signed)
 Tried calling patient to schedule OFFICE VISIT no answer left detailed vm to call back and schedule

## 2024-01-26 NOTE — Telephone Encounter (Signed)
 [facility] Pre-operative Risk Assessment     Request for surgical clearance:     Endoscopy Procedure  What type of surgery is being performed?     Colonoscopy  When is this surgery scheduled?     03/20/2024  What type of clearance is required ?   Medical Clearance - see recent echocardiogram Are there any medications that need to be held prior to surgery and how long? N/A  Practice name and name of physician performing surgery?      [facility]  What is your office phone and fax number?      Phone- [phone]  Fax- [phone]  Anesthesia type (None, local, MAC, general) ?       MAC   Please route your response to Philo B, RN

## 2024-01-26 NOTE — Telephone Encounter (Signed)
-----   Message from Alan JONELLE Coombs sent at 01/26/2024 11:08 AM EDT ----- Regarding: FW: Please evaluate Echo Please get cardiac clearance for colonoscopy at [facility] Thanks, Alan  ----- Message ----- From: Dyan Rush, CRNA Sent: 01/26/2024  10:59 AM EDT To: Alan JONELLE Coombs, PA-C; Sandor San GAILS, DO Subject: RE: Please evaluate Echo                       Alan,  Due to his aortic stenosis please request a cardiac clearance.  Thanks much,  Rush Dyan ----- Message ----- From: Coombs Alan JONELLE DEVONNA Sent: 01/26/2024   8:03 AM EDT To: Rush Dyan, CRNA; Vito Cirigliano V, DO Subject: Please evaluate Echo                           Patient wants to proceed with colonoscopy but had mild to moderate aortic stenosis 2 years ago so I ordered echocardiogram. This reads as moderate aortic stenosis, the aortic ejection velocity is greater than 4 however the AVA is 0.66 cm. Has some mild dyspnea and swelling no chest pain overall well. Would we be able to proceed at the [facility] with cardiac clearance or should he be scheduled at the hospital? Thanks, Alan  ----- Message ----- From: Interface, [employer] Sent: 01/25/2024   5:06 PM EDT To: Alan JONELLE Coombs, PA-C

## 2024-01-30 NOTE — Telephone Encounter (Signed)
 Pt has appt scheduled for 02/09/24. Preop clearance will be done at this appt.

## 2024-01-31 NOTE — Progress Notes (Signed)
 Agree with the assessment and plan as outlined by Quentin Mulling, PA-C. ? ?Keron Neenan, DO, FACG ? ?

## 2024-02-06 NOTE — Progress Notes (Unsigned)
 Cardiology Office Note   Date:  02/09/2024  ID:  Antonio Wells, DOB [birth year], MRN [id number] PCP: Antonio Reyes SAUNDERS, MD  [facility] Providers Cardiologist:  Oneil Parchment, MD {  History of Present Illness Antonio Wells is a 78 y.o. male with with a past medical history of coronary artery disease status post CABG in 2018 and hypertension here for follow-up appointment.  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  The patient had an echocardiogram on 02/12/2020 which showed a normal ejection fraction and mild aortic valve stenosis.  Repeat echo 04/22/2022 confirmed these findings.  The patient had prior episodes of high degree in second-degree heart block which resolved after stopping her beta-blocker.  Currently on hydralazine , Riva Sartain, and isosorbide  for hypertension.  Patient reported minor shortness of breath with activity of the last time he was here which resolves upon rest.  Unsure of the triggers for the symptoms.  Patient had history of more severe fatigue and breathlessness prior to bypass surgery.  He was currently well-managed without a beta-blocker and with heart rate of 83 bpm on the day of his last consultation.  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  Today, he presents with aortic stenosis and shortness of breath.  He experiences occasional shortness of breath, particularly when walking up stairs, with a quicker onset than before. An echocardiogram in 01/25/2024 showed moderate calcification and narrowing of the aortic valve with normal heart pump function and an ejection fraction of 60-65%. AVA now 0.66. This has progressed since his last echo in 2023. He has a history of coronary artery bypass grafting in 2018. He experiences slight lightheadedness or dizziness occasionally, with no episodes of syncope, palpitations, or heart rate irregularities.  He can walk one to two blocks but experiences discomfort due to orthopedic  issues with his knee and hip. He performs household tasks and yard work without significant issues. His current medications include aspirin , and he did not take his blood pressure medication on the morning of the visit.  Reports no chest pain, pressure, or tightness. No edema, orthopnea, PND. Reports no palpitations.   Discussed the use of AI scribe software for clinical note transcription with the patient, who gave verbal consent to proceed.   ROS: pertinent ROS in HPI  Studies Reviewed     Echo 01/25/24 IMPRESSIONS     1. Left ventricular ejection fraction, by estimation, is 60 to 65%. The  left ventricle has normal function. The left ventricle has no regional  wall motion abnormalities. There is moderate concentric left ventricular  hypertrophy. Left ventricular  diastolic parameters are consistent with Grade I diastolic dysfunction  (impaired relaxation). The average left ventricular global longitudinal  strain is -15.6 %. The global longitudinal strain is abnormal.   2. Right ventricular systolic function is normal. The right ventricular  size is normal.   3. The mitral valve is normal in structure. No evidence of mitral valve  regurgitation. No evidence of mitral stenosis.   4. The aortic valve is severely calcified with restricted excursion of  valve cusps. Aortic valve regurgitation is not visualized. Likely moderate  aortic valve stenosis. Aortic valve area, by VTI measures 0.66 cm. Aortic  valve mean gradient measures  17.0 mmHg. Aortic valve Vmax measures 2.70 m/s. SVi of 19cc/m2 (unreliable  LVOT tracing).   5. The inferior vena cava is normal in size with greater than 50%  respiratory variability, suggesting right atrial pressure of  3 mmHg.   FINDINGS   Physical Exam VS:  BP (!) 144/76   Pulse 84   Ht 5' 2 (1.575 m)   Wt 179 lb 6.4 oz (81.4 kg)   SpO2 96%   BMI 32.81 kg/m        Wt Readings from Last 3 Encounters:  02/09/24 179 lb 6.4 oz (81.4 kg)   01/10/24 178 lb 9.6 oz (81 kg)  01/04/24 181 lb 9.6 oz (82.4 kg)    GEN: Well nourished, well developed in no acute distress NECK: No JVD; No carotid bruits CARDIAC: RRR, no murmurs, rubs, gallops RESPIRATORY:  Clear to auscultation without rales, wheezing or rhonchi  ABDOMEN: Soft, non-tender, non-distended EXTREMITIES:  No edema; No deformity   ASSESSMENT AND PLAN  Preop clearance:  [name]'s perioperative risk of a major cardiac event is 0.9% according to the Revised Cardiac Risk Index (RCRI).  Therefore, he is at low risk for perioperative complications.   His functional capacity is good at 5.62 METs according to the Duke Activity Status Index (DASI). Recommendations: According to ACC/AHA guidelines, no further cardiovascular testing needed.  The patient may proceed to surgery at acceptable risk.   Antiplatelet and/or Anticoagulation Recommendations: The patient should remain on Aspirin  without interruption.    Aortic valve stenosis Moderate calcification and narrowing of the aortic valve with progression noted on echocardiogram. Symptoms include occasional shortness of breath and slight dizziness. No syncope reported. - Send echocardiogram to Dr. Jeffrie for review. - Repeat echocardiogram in six months to monitor aortic valve. - Advise to report worsening symptoms such as increased shortness of breath, dizziness, or syncope.  Hypertension Blood pressure at 144/76, slightly elevated due to missed morning medication dose.  Chronic kidney disease, unspecified Kidney function not normal with creatinine at 1.7.  CAD status post coronary bypass grafting in 2018 -no chest pain  Hyperlipidemia -due for updated lipid panel in Jan 2026      Dispo: He can follow-up in 6 months with Dr. Jeffrie post echocardiogram  Signed, Orren LOISE Fabry, PA-C

## 2024-02-09 ENCOUNTER — Encounter: Payer: Self-pay | Admitting: Physician Assistant

## 2024-02-09 ENCOUNTER — Ambulatory Visit: Payer: Medicare (Managed Care) | Attending: Physician Assistant | Admitting: Physician Assistant

## 2024-02-09 VITALS — BP 144/76 | HR 84 | Ht 62.0 in | Wt 179.4 lb

## 2024-02-09 DIAGNOSIS — I119 Hypertensive heart disease without heart failure: Secondary | ICD-10-CM | POA: Diagnosis not present

## 2024-02-09 DIAGNOSIS — Z951 Presence of aortocoronary bypass graft: Secondary | ICD-10-CM | POA: Diagnosis not present

## 2024-02-09 DIAGNOSIS — E785 Hyperlipidemia, unspecified: Secondary | ICD-10-CM | POA: Diagnosis not present

## 2024-02-09 DIAGNOSIS — R001 Bradycardia, unspecified: Secondary | ICD-10-CM

## 2024-02-09 NOTE — Patient Instructions (Signed)
 Medication Instructions:  Your physician recommends that you continue on your current medications as directed. Please refer to the Current Medication list given to you today.  *If you need a refill on your cardiac medications before your next appointment, please call your pharmacy*  Lab Work: None ordered  If you have labs (blood work) drawn today and your tests are completely normal, you will receive your results only by: MyChart Message (if you have MyChart) OR A paper copy in the mail If you have any lab test that is abnormal or we need to change your treatment, we will call you to review the results.  Testing/Procedures: Your physician has requested that you have an echocardiogram. Echocardiography is a painless test that uses sound waves to create images of your heart. It provides your doctor with information about the size and shape of your heart and how well your heart's chambers and valves are working. This procedure takes approximately one hour. There are no restrictions for this procedure. Please do NOT wear cologne, perfume, aftershave, or lotions (deodorant is allowed). Please arrive 15 minutes prior to your appointment time.  Please note: We ask at that you not bring children with you during ultrasound (echo/ vascular) testing. Due to room size and safety concerns, children are not allowed in the ultrasound rooms during exams. Our front office staff cannot provide observation of children in our lobby area while testing is being conducted. An adult accompanying a patient to their appointment will only be allowed in the ultrasound room at the discretion of the ultrasound technician under special circumstances. We apologize for any inconvenience.   Follow-Up: At [facility], you and your health needs are our priority.  As part of our continuing mission to provide you with exceptional heart care, our providers are all part of one team.  This team includes your primary  Cardiologist (physician) and Advanced Practice Providers or APPs (Physician Assistants and Nurse Practitioners) who all work together to provide you with the care you need, when you need it.  Your next appointment:   6 month(s)  Provider:   Dorothye Gathers, MD    We recommend signing up for the patient portal called "MyChart".  Sign up information is provided on this After Visit Summary.  MyChart is used to connect with patients for Virtual Visits (Telemedicine).  Patients are able to view lab/test results, encounter notes, upcoming appointments, etc.  Non-urgent messages can be sent to your provider as well.   To learn more about what you can do with MyChart, go to [email or web address].   Other Instructions

## 2024-02-22 ENCOUNTER — Ambulatory Visit (INDEPENDENT_AMBULATORY_CARE_PROVIDER_SITE_OTHER): Payer: Medicare (Managed Care) | Admitting: Family Medicine

## 2024-02-22 ENCOUNTER — Encounter: Payer: Self-pay | Admitting: Family Medicine

## 2024-02-22 VITALS — BP 130/58 | HR 84 | Temp 98.0°F | Resp 18 | Ht 62.0 in | Wt 181.4 lb

## 2024-02-22 DIAGNOSIS — K760 Fatty (change of) liver, not elsewhere classified: Secondary | ICD-10-CM | POA: Diagnosis not present

## 2024-02-22 DIAGNOSIS — E785 Hyperlipidemia, unspecified: Secondary | ICD-10-CM

## 2024-02-22 DIAGNOSIS — I10 Essential (primary) hypertension: Secondary | ICD-10-CM | POA: Diagnosis not present

## 2024-02-22 DIAGNOSIS — Z7984 Long term (current) use of oral hypoglycemic drugs: Secondary | ICD-10-CM

## 2024-02-22 DIAGNOSIS — I2510 Atherosclerotic heart disease of native coronary artery without angina pectoris: Secondary | ICD-10-CM | POA: Diagnosis not present

## 2024-02-22 DIAGNOSIS — E1122 Type 2 diabetes mellitus with diabetic chronic kidney disease: Secondary | ICD-10-CM

## 2024-02-22 LAB — MICROALBUMIN / CREATININE URINE RATIO
Creatinine,U: 115.3 mg/dL
Microalb Creat Ratio: 87.7 mg/g — ABNORMAL HIGH (ref 0.0–30.0)
Microalb, Ur: 10.1 mg/dL — ABNORMAL HIGH (ref 0.0–1.9)

## 2024-02-22 MED ORDER — ONETOUCH ULTRASOFT LANCETS MISC: 1.0000 | 12 refills | Status: AC

## 2024-02-22 MED ORDER — GLUCOSE BLOOD VI STRP: ORAL_STRIP | 12 refills | Status: AC

## 2024-02-22 MED ORDER — METFORMIN HCL 500 MG PO TABS
500.0000 mg | ORAL_TABLET | Freq: Two times a day (BID) | ORAL | 3 refills | Status: DC
Start: 2024-02-22 — End: 2024-05-21

## 2024-02-22 MED ORDER — CHLORTHALIDONE 25 MG PO TABS: 12.5000 mg | ORAL_TABLET | Freq: Every morning | ORAL | 2 refills | Status: DC

## 2024-02-22 MED ORDER — SITAGLIPTIN PHOSPHATE 50 MG PO TABS: 50.0000 mg | ORAL_TABLET | Freq: Every day | ORAL | 2 refills | Status: DC

## 2024-02-22 MED ORDER — ATORVASTATIN CALCIUM 80 MG PO TABS: ORAL_TABLET | ORAL | 1 refills | Status: DC

## 2024-02-22 NOTE — Patient Instructions (Addendum)
 Thank you for coming in today. No change in medications at this time. If there are any concerns on your bloodwork, I will let you know. Take care! If further questions on liver test, recommend discussing further with gastroenterology. Continue to watch diet - more vegetables, walking for exercise.

## 2024-02-22 NOTE — Progress Notes (Signed)
 Subjective:  Patient ID: Antonio Wells, male    DOB: 10/10/1945  Age: 78 y.o. MRN: [id number]  CC:  Chief Complaint  Patient presents with   Follow-up    6 month recheck on labs and medications. Had a US  on liver. Would like to discuss    HPI Antonio Wells presents for   Diabetes: This CKD, CAD, treated with Januvia , metformin .  Intolerant to SGLT2's.  He is on statin ARB and followed by cardiology.  Januvia  has been cost prohibitive in the past.  When we met in January he had been off the Januvia  since November at that time due to cost, but had restarted a few weeks prior.  A1c is only borderline elevated at 7.1. Followed by nephrology with his CKD, stage III.  Leonore  had been ordered previously but cost prohibitive, then was able to restart few weeks prior to his last visit. Still taking Januvia  daily. Metformin  500mg  BID.  Kerendia - out of it for awhile again with getting that approved,  back on yesterday.  On ARB, statin.   Recent home readings, fasting: low 100's.  Postprandial none.  No symptomatic lows.  Microalbumin: due, but followed by nephrology as well. On ARB, kerendia.  Optho, foot exam, pneumovax: Declines pneumococcal vaccine.  Lab Results  Component Value Date   HGBA1C 7.1 (H) 08/22/2023   HGBA1C 6.7 (H) 05/05/2022   HGBA1C 6.6 (A) 02/01/2022   Lab Results  Component Value Date   MICROALBUR 39.6 08/25/2015   LDLCALC 69 08/22/2023   CREATININE 1.78 (H) 08/22/2023   Hypertension: With CKD as above, treated by nephrology and cardiology.  Prior bradycardia, beta-blocker was stopped after evaluation with electrophysiology.  Treated with chlorthalidone  12.5mg  every day,  hydralazine  - 50mg  in am, 100mg  at night, irbesartan  300mg  every day,  isosorbide  60mg  qhs, and on Kerendia 10mg .  History of obstructive sleep apnea but had stopped use of CPAP due to concerns of cleanliness of his machine at his January visit. Still not using.  Home readings:  130/80 range.  BP Readings from Last 3 Encounters:  02/22/24 (!) 130/58  02/09/24 (!) 144/76  01/10/24 138/72   Lab Results  Component Value Date   CREATININE 1.78 (H) 08/22/2023   Hyperlipidemia: With history of CAD, CABG, followed by cardiology, treated with Lipitor  80 mg daily. No new myalgias.  No new CP/dyspnea.  Lab Results  Component Value Date   CHOL 152 08/22/2023   HDL 57.30 08/22/2023   LDLCALC 69 08/22/2023   TRIG 127.0 08/22/2023   CHOLHDL 3 08/22/2023   Lab Results  Component Value Date   ALT 16 08/22/2023   AST 20 08/22/2023   ALKPHOS 52 08/22/2023   BILITOT 0.5 08/22/2023    Hepatic steatosis Noted on prior imaging, ultrasound elastography ordered with LFTs and CBC monitored every 6 months per gastroenterology visit June 11.  Elastography on June 19.  Increased echogenicity with no focal abnormality.kPA 8 which is in medium category or likely fibrosis stage I or II.  Suggested avoiding all alcohol, repeating liver labs and CBC every 6 months with GI or PCP, work on weight loss, increase fruits veggies and exercise.  Also plan for colonoscopy after cardiac clearance. No changes in exercise, no diet changes other than a few more fruits.  No other questions at this time.    History Patient Active Problem List   Diagnosis Date Noted   Syncope 02/15/2020   Bradycardia 02/15/2020   Hypertensive encephalopathy 10/19/2018  Chronic kidney disease, stage 3 10/19/2018   DM (diabetes mellitus), type 2 with renal complications (HCC) 10/19/2018   Cough 03/17/2018   Lower resp. 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   CAD (coronary artery disease) 05/25/2017   Unstable angina (HCC)    Hypertensive heart disease 07/14/2015   Lower extremity edema 07/14/2015    Aortic stenosis 01/09/2015   OSA on CPAP 12/12/2014   Hypersomnia with sleep apnea 12/12/2014   Obstructive sleep apnea (adult) (pediatric) 02/04/2014   Hypersomnia, persistent 02/04/2014   Obesity 02/04/2014   Mitral regurgitation and aortic stenosis 01/02/2014   DOE (dyspnea on exertion) 11/26/2013   Gout 08/12/2011   Hypertension 08/12/2011   High cholesterol 08/12/2011   OSA (obstructive sleep apnea) 08/12/2011   Diabetes type 2, uncontrolled 08/12/2011   Past Medical History:  Diagnosis Date   Arthritis    knees (07/11/2017)   CKD (chronic kidney disease), stage III (HCC)    lov dr bernardino sanford 04-23-2020 on chart   Colon polyp    Complication of anesthesia    post op afibe after cabg x 2 2018   Coronary artery disease    a.  CABGx3 in 05/2017.   Gout    Heart murmur    mild, saw dr waddell 03-26-2020 f/u prn   High cholesterol    Hypertension    Mild aortic stenosis    Mild mitral regurgitation    OSA on CPAP    last used cpap 3 weeks ago   Phimosis    Postoperative atrial fibrillation (HCC) 06/2017   Sleep apnea    uses CPAP   Type II diabetes mellitus [facility])    Past Surgical History:  Procedure Laterality Date   CARDIAC CATHETERIZATION     CIRCUMCISION N/A 06/27/2020   Procedure: CIRCUMCISION ADULT;  Surgeon: Devere Lonni Righter, MD;  Location: [facility];  Service: Urology;  Laterality: N/A;   COLONOSCOPY  2008   CORONARY ARTERY BYPASS GRAFT N/A 06/24/2017   Procedure: CORONARY ARTERY BYPASS GRAFTING (CABG) x 3 ; Using Left Internal Mammary Artery and Right Great Saphenous Leg Vein Harvested Endoscopically;  Surgeon: Lucas Dorise POUR, MD;  Location: [facility] OR;  Service: Open Heart Surgery;  Laterality: N/A;   LEFT HEART CATH AND CORONARY ANGIOGRAPHY N/A 05/25/2017   Procedure: LEFT HEART CATH AND CORONARY ANGIOGRAPHY;  Surgeon: Darron Deatrice LABOR, MD;  Location: [facility] INVASIVE CV LAB;  Service: Cardiovascular;  Laterality: N/A;   TEE WITHOUT  CARDIOVERSION N/A 06/24/2017   Procedure: TRANSESOPHAGEAL ECHOCARDIOGRAM (TEE);  Surgeon: Lucas Dorise POUR, MD;  Location: [facility] OR;  Service: Open Heart Surgery;  Laterality: N/A;   Allergies  Allergen Reactions   Penicillins Other (See Comments)    Has patient had a PCN reaction causing immediate rash, facial/tongue/throat swelling, SOB or lightheadedness with hypotension: No Has patient had a PCN reaction causing severe rash involving mucus membranes or skin necrosis: No Has patient had a PCN reaction that required hospitalization: No Has patient had a PCN reaction occurring within the last 10 years: No If all of the above answers are NO, then may proceed with Cephalosporin use.   Passed out ? SYNCOPE ?   Lisinopril Cough   Amiodarone  Nausea And Vomiting   Amlodipine  Other (See Comments)  Causes dizzines   Beta Adrenergic Blockers     Do not take beta blockers causes heart block with coreg    Oxycodone  Other (See Comments)    hallucinations   Zofran  [Ondansetron  Hcl] Other (See Comments)    Has an interaction with another medication pt takes    Cortisone Nausea And Vomiting and Other (See Comments)    HICCUPS   Tramadol  Nausea And Vomiting and Other (See Comments)    HICCUPS   Prior to Admission medications   Medication Sig Start Date End Date Taking? Authorizing Provider  acetaminophen  (TYLENOL ) 325 MG tablet Take 650 mg by mouth every 6 (six) hours as needed for mild pain.   Yes [provider]  aspirin  EC 81 MG tablet Take 1 tablet (81 mg total) by mouth daily. 09/02/17  Yes Dunn, Dayna N, PA-C  atorvastatin  (LIPITOR ) 80 MG tablet TAKE 1 TABLET BY MOUTH DAILY AT  6 PM 10/31/23  Yes Levora Reyes SAUNDERS, MD  chlorthalidone  (HYGROTON ) 25 MG tablet Take 0.5 tablets (12.5 mg total) by mouth every morning. 10/31/23  Yes Levora Reyes SAUNDERS, MD  glucose blood test strip Use as instructed 01/21/20  Yes Levora Reyes SAUNDERS, MD  hydrALAZINE  (APRESOLINE ) 50 MG tablet TAKE 1 TABLET BY MOUTH  IN THE  MORNING AND 2 TABLETS BY MOUTH  IN THE EVENING 08/02/23  Yes Levora Reyes SAUNDERS, MD  irbesartan  (AVAPRO ) 300 MG tablet Take 1 tablet (300 mg total) by mouth daily. TAKE 1 TABLET BY MOUTH DAILY 11/25/23  Yes Levora Reyes SAUNDERS, MD  isosorbide  mononitrate (IMDUR ) 60 MG 24 hr tablet Take 1 tablet (60 mg total) by mouth at bedtime. 10/26/23  Yes Jeffrie Oneil BROCKS, MD  Lancets [facility] ULTRASOFT) lancets 1 each by Other route 1 day or 1 dose for 1 dose. Use as instructed 01/30/20 02/22/24 Yes Levora Reyes SAUNDERS, MD  metFORMIN  (GLUCOPHAGE ) 500 MG tablet Take 1 tablet (500 mg total) by mouth 2 (two) times daily with a meal. TAKE 1 TABLET BY MOUTH TWICE  DAILY WITH MEALS 12/14/23  Yes Levora Reyes SAUNDERS, MD  Na Sulfate-K Sulfate-Mg Sulfate concentrate (SUPREP BOWEL PREP KIT) 17.5-3.13-1.6 GM/177ML SOLN Take 1 kit (354 mLs total) by mouth as directed. 01/04/24  Yes Cirigliano, Vito V, DO  sitaGLIPtin  (JANUVIA ) 50 MG tablet Take 1 tablet (50 mg total) by mouth daily. 08/02/23  Yes Levora Reyes SAUNDERS, MD  KERENDIA 10 MG TABS Take 1 tablet by mouth daily. Patient not taking: Reported on 02/22/2024 08/05/23   [provider]   Social History   Socioeconomic History   Marital status: Married    Spouse name: Rock   Number of children: 3   Years of education: 16   Highest education level: Bachelor's degree (e.g., BA, AB, BS)  Occupational History   Occupation: Musician  Tobacco Use   Smoking status: Former    Current packs/day: 0.00    Average packs/day: 2.0 packs/day for 15.0 years (30.0 ttl pk-yrs)    Types: Cigarettes    Start date: 07/27/1983    Quit date: 07/26/1998    Years since quitting: 25.5   Smokeless tobacco: Never  Vaping Use   Vaping status: Never Used  Substance and Sexual Activity   Alcohol use: Not Currently    Alcohol/week: 0.0 standard drinks of alcohol   Drug use: No   Sexual activity: Yes  Other Topics Concern   Not on file  Social History Narrative         Social Drivers of  Health   Financial Resource Strain: Low Risk  (01/10/2024)   Overall Financial Resource Strain (CARDIA)    Difficulty of Paying Living Expenses: Not hard at all  Food Insecurity: No Food Insecurity (01/10/2024)   Hunger Vital Sign    Worried About Running Out of Food in the Last Year: Never true    Ran Out of Food in the Last Year: Never true  Transportation Needs: No Transportation Needs (01/10/2024)   PRAPARE - Administrator, Civil Service (Medical): No    Lack of Transportation (Non-Medical): No  Physical Activity: Insufficiently Active (01/10/2024)   Exercise Vital Sign    Days of Exercise per Week: 1 day    Minutes of Exercise per Session: 10 min  Stress: No Stress Concern Present (01/10/2024)   [employer] of Occupational Health - Occupational Stress Questionnaire    Feeling of Stress: Not at all  Social Connections: Moderately Integrated (01/10/2024)   Social Connection and Isolation Panel    Frequency of Communication with Friends and Family: More than three times a week    Frequency of Social Gatherings with Friends and Family: Twice a week    Attends Religious Services: 1 to 4 times per year    Active Member of [employer] or Organizations: No    Attends Banker Meetings: Not on file    Marital Status: Married  Catering manager Violence: Not At Risk (01/10/2024)   Humiliation, Afraid, Rape, and Kick questionnaire    Fear of Current or Ex-Partner: No    Emotionally Abused: No    Physically Abused: No    Sexually Abused: No    Review of Systems Per HPI  Objective:   Vitals:   02/22/24 1323  BP: (!) 130/58  Pulse: 84  Resp: 18  Temp: 98 F (36.7 C)  TempSrc: Temporal  SpO2: 98%  Weight: 181 lb 6.4 oz (82.3 kg)  Height: 5' 2 (1.575 m)     Physical Exam Vitals reviewed.  Constitutional:      Appearance: He is well-developed.  HENT:     Head: Normocephalic and atraumatic.  Neck:     Vascular: No carotid bruit or JVD.   Cardiovascular:     Rate and Rhythm: Normal rate and regular rhythm.     Heart sounds: Normal heart sounds. No murmur heard. Pulmonary:     Effort: Pulmonary effort is normal.     Breath sounds: Normal breath sounds. No rales.  Musculoskeletal:     Right lower leg: No edema.     Left lower leg: No edema.  Skin:    General: Skin is warm and dry.  Neurological:     Mental Status: He is alert and oriented to person, place, and time.  Psychiatric:        Mood and Affect: Mood normal.        Assessment & Plan:  Antonio Wells is a 78 y.o. male . Essential hypertension - Plan: chlorthalidone  (HYGROTON ) 25 MG tablet  - Stable with current medical regimen, check labs and adjust plan accordingly.  Continue follow-up with nephrology with underlying CKD.  Type 2 diabetes mellitus with chronic kidney disease, without long-term current use of insulin , unspecified CKD stage (HCC) - Plan: Lancets (ONETOUCH ULTRASOFT) lancets, glucose blood test strip, metFORMIN  (GLUCOPHAGE ) 500 MG tablet, sitaGLIPtin  (JANUVIA ) 50 MG tablet, Hemoglobin A1c, Microalbumin / creatinine urine ratio  - Check labs, continue Januvia , metformin  for now.  Adjust plan accordingly based on results.  Coronary artery  disease involving native heart without angina pectoris, unspecified vessel or lesion type - Plan: atorvastatin  (LIPITOR ) 80 MG tablet  - Asymptomatic, continue statin for lipid management, secondary prevention.  Hyperlipidemia, unspecified hyperlipidemia type - Plan: Lipid panel As above, continue Lipitor .  Continue follow-up with cardiology   Hepatic steatosis - Plan: Comprehensive metabolic panel with GFR  - Elastography as above.  Recommendations discussed, reinforced from gastroenterology, increased vegetables, attempts at weight loss.  He does not drink alcohol.  Recheck LFTs ordered.  Meds ordered this encounter  Medications   Lancets (ONETOUCH ULTRASOFT) lancets    Sig: 1 each by Other route 1  day or 1 dose for 1 dose. Use as instructed    Dispense:  100 each    Refill:  12   glucose blood test strip    Sig: Use as instructed    Dispense:  100 each    Refill:  12   atorvastatin  (LIPITOR ) 80 MG tablet    Sig: TAKE 1 TABLET BY MOUTH DAILY AT  6 PM    Dispense:  90 tablet    Refill:  1    Please send a replace/new response with 100-Day Supply if appropriate to maximize member benefit. Requesting 1 year supply.   chlorthalidone  (HYGROTON ) 25 MG tablet    Sig: Take 0.5 tablets (12.5 mg total) by mouth every morning.    Dispense:  50 tablet    Refill:  2    Please send a replace/new response with 100-Day Supply if appropriate to maximize member benefit. Requesting 1 year supply.   metFORMIN  (GLUCOPHAGE ) 500 MG tablet    Sig: Take 1 tablet (500 mg total) by mouth 2 (two) times daily with a meal. TAKE 1 TABLET BY MOUTH TWICE  DAILY WITH MEALS    Dispense:  180 tablet    Refill:  3    Temporary out of town fill.   sitaGLIPtin  (JANUVIA ) 50 MG tablet    Sig: Take 1 tablet (50 mg total) by mouth daily.    Dispense:  100 tablet    Refill:  2    Please send a replace/new response with 100-Day Supply if appropriate to maximize member benefit. Requesting 1 year supply.   Patient Instructions  Thank you for coming in today. No change in medications at this time. If there are any concerns on your bloodwork, I will let you know. Take care! If further questions on liver test, recommend discussing further with gastroenterology. Continue to watch diet - more vegetables, walking for exercise.     Signed,   Reyes Pines, MD [facility], [facility] 02/22/24 2:26 PM

## 2024-02-23 LAB — COMPREHENSIVE METABOLIC PANEL WITH GFR
ALT: 13 U/L (ref 0–53)
AST: 17 U/L (ref 0–37)
Albumin: 4.6 g/dL (ref 3.5–5.2)
Alkaline Phosphatase: 51 U/L (ref 39–117)
BUN: 23 mg/dL (ref 6–23)
CO2: 31 meq/L (ref 19–32)
Calcium: 9.3 mg/dL (ref 8.4–10.5)
Chloride: 106 meq/L (ref 96–112)
Creatinine, Ser: 1.7 mg/dL — ABNORMAL HIGH (ref 0.40–1.50)
GFR: 38.51 mL/min — ABNORMAL LOW (ref >60.00)
Glucose, Bld: 73 mg/dL (ref 70–99)
Potassium: 4.5 meq/L (ref 3.5–5.1)
Sodium: 142 meq/L (ref 135–145)
Total Bilirubin: 0.5 mg/dL (ref 0.2–1.2)
Total Protein: 7.3 g/dL (ref 6.0–8.3)

## 2024-02-23 LAB — LIPID PANEL
Cholesterol: 136 mg/dL (ref 0–200)
HDL: 62.4 mg/dL (ref >39.00)
LDL Cholesterol: 56 mg/dL (ref 0–99)
NonHDL: 73.59
Total CHOL/HDL Ratio: 2
Triglycerides: 86 mg/dL (ref 0.0–149.0)
VLDL: 17.2 mg/dL (ref 0.0–40.0)

## 2024-02-23 LAB — HEMOGLOBIN A1C: Hgb A1c MFr Bld: 6.6 % — ABNORMAL HIGH (ref 4.6–6.5)

## 2024-02-24 ENCOUNTER — Ambulatory Visit: Payer: Self-pay | Admitting: Family Medicine

## 2024-03-12 ENCOUNTER — Encounter: Payer: Self-pay | Admitting: Gastroenterology

## 2024-03-20 ENCOUNTER — Ambulatory Visit: Payer: Medicare (Managed Care) | Admitting: Gastroenterology

## 2024-03-20 ENCOUNTER — Encounter: Payer: Self-pay | Admitting: Gastroenterology

## 2024-03-20 ENCOUNTER — Other Ambulatory Visit: Payer: Self-pay | Admitting: Cardiology

## 2024-03-20 VITALS — BP 134/68 | HR 77 | Temp 97.3°F | Resp 15 | Ht 62.0 in | Wt 181.0 lb

## 2024-03-20 DIAGNOSIS — K641 Second degree hemorrhoids: Secondary | ICD-10-CM | POA: Diagnosis not present

## 2024-03-20 DIAGNOSIS — K5730 Diverticulosis of large intestine without perforation or abscess without bleeding: Secondary | ICD-10-CM

## 2024-03-20 DIAGNOSIS — Z1211 Encounter for screening for malignant neoplasm of colon: Secondary | ICD-10-CM | POA: Diagnosis not present

## 2024-03-20 DIAGNOSIS — D122 Benign neoplasm of ascending colon: Secondary | ICD-10-CM | POA: Diagnosis not present

## 2024-03-20 DIAGNOSIS — K6389 Other specified diseases of intestine: Secondary | ICD-10-CM | POA: Diagnosis not present

## 2024-03-20 DIAGNOSIS — I2510 Atherosclerotic heart disease of native coronary artery without angina pectoris: Secondary | ICD-10-CM | POA: Diagnosis not present

## 2024-03-20 DIAGNOSIS — K635 Polyp of colon: Secondary | ICD-10-CM

## 2024-03-20 DIAGNOSIS — K648 Other hemorrhoids: Secondary | ICD-10-CM

## 2024-03-20 DIAGNOSIS — Z800 Family history of malignant neoplasm of digestive organs: Secondary | ICD-10-CM | POA: Diagnosis not present

## 2024-03-20 DIAGNOSIS — Z860101 Personal history of adenomatous and serrated colon polyps: Secondary | ICD-10-CM

## 2024-03-20 DIAGNOSIS — G4733 Obstructive sleep apnea (adult) (pediatric): Secondary | ICD-10-CM | POA: Diagnosis not present

## 2024-03-20 MED ORDER — SODIUM CHLORIDE 0.9 % IV SOLN: 500.0000 mL | Freq: Once | INTRAVENOUS | Status: DC

## 2024-03-20 NOTE — Op Note (Signed)
 [facility] Patient Name: Antonio Wells Procedure Date: 03/20/2024 10:24 AM MRN: [id number] Endoscopist: Sandor Flatter , MD, [id number] Age: 78 Referring MD:  Date of Birth: 1946-06-14 Gender: Male Account #: [id number] Procedure:                Colonoscopy Indications:              Screening in patient at increased risk: Colorectal                            cancer in brother 87 or older, Surveillance:                            Personal history of adenomatous polyps on last                            colonoscopy 3 years ago                           05/2018 colonoscopy with 9 adenomatous polyps                           12/16/2020 colonoscopy with 9 polyps 2 to 5 mm                            rectum sigmoid transverse ascending colon,                            diverticulosis, nonbleeding internal hemorrhoids                            normal TI Medicines:                Monitored Anesthesia Care Procedure:                Pre-Anesthesia Assessment:                           - Prior to the procedure, a History and Physical                            was performed, and patient medications and                            allergies were reviewed. The patient's tolerance of                            previous anesthesia was also reviewed. The risks                            and benefits of the procedure and the sedation                            options and risks were discussed with the patient.  All questions were answered, and informed consent                            was obtained. Prior Anticoagulants: The patient has                            taken no anticoagulant or antiplatelet agents. ASA                            Grade Assessment: III - A patient with severe                            systemic disease. After reviewing the risks and                            benefits, the patient was deemed in satisfactory                             condition to undergo the procedure.                           After obtaining informed consent, the colonoscope                            was passed under direct vision. Throughout the                            procedure, the patient's blood pressure, pulse, and                            oxygen  saturations were monitored continuously. The                            CF [id number] #[id number] was introduced through the anus                            and advanced to the the cecum, identified by                            appendiceal orifice and ileocecal valve. The                            colonoscopy was performed without difficulty. The                            patient tolerated the procedure well. The quality                            of the bowel preparation was good. The ileocecal                            valve, appendiceal orifice, and rectum were  photographed. Scope In: 10:41:35 AM Scope Out: 10:58:39 AM Scope Withdrawal Time: 0 hours 13 minutes 41 seconds  Total Procedure Duration: 0 hours 17 minutes 4 seconds  Findings:                 The perianal and digital rectal examinations were                            normal.                           A 4 mm polyp was found in the ascending colon. The                            polyp was sessile. The polyp was removed with a                            cold snare. Resection and retrieval were complete.                            Estimated blood loss was minimal.                           Multiple large-mouthed and small-mouthed                            diverticula were found in the sigmoid colon,                            descending colon, ascending colon and cecum.                           Non-bleeding internal hemorrhoids were found during                            retroflexion. The hemorrhoids were small. Complications:            No immediate complications. Estimated Blood Loss:     Estimated blood  loss was minimal. Impression:               - One 4 mm polyp in the ascending colon, removed                            with a cold snare. Resected and retrieved.                           - Diverticulosis in the sigmoid colon, in the                            descending colon, in the ascending colon and in the                            cecum.                           - Non-bleeding internal hemorrhoids. Recommendation:           -  Patient has a contact number available for                            emergencies. The signs and symptoms of potential                            delayed complications were discussed with the                            patient. Return to normal activities tomorrow.                            Written discharge instructions were provided to the                            patient.                           - Resume previous diet.                           - Continue present medications.                           - Await pathology results.                           - Will review pathology results, but based on                            today's results, can likely forego any continued                            colonoscopies for screening/polyp surveillance                            owing to age (would be age 13 at repeat) and                            comorbidities.                           - Return to GI clinic PRN. Sandor Flatter, MD 03/20/2024 11:06:25 AM

## 2024-03-20 NOTE — Progress Notes (Signed)
 GASTROENTEROLOGY PROCEDURE H&P NOTE   Primary Care Physician: Levora Reyes SAUNDERS, MD    Reason for Procedure:  Colon polyp surveillance, family history of colon cancer  Plan:    Colonoscopy  Patient is appropriate for endoscopic procedure(s) in the ambulatory ([facility]) setting.  The nature of the procedure, as well as the risks, benefits, and alternatives were carefully and thoroughly reviewed with the patient. Ample time for discussion and questions allowed. The patient understood, was satisfied, and agreed to proceed.     HPI: Antonio Wells is a 78 y.o. male who presents for colonoscopy for ongoing colon polyp surveillance and colon cancer screening.  Patient is otherwise without complaints or active issues today.  Family history of brother recently diagnosed with advanced colon cancer and has since passed away.   06-Jun-2018 colonoscopy 9 TA polyps 12/16/2020 colonoscopy-Tranell Wojtkiewicz good prep 9 polyps 2 to 5 mm rectum sigmoid transverse ascending colon, diverticulosis, nonbleeding internal hemorrhoids normal TI  Past Medical History:  Diagnosis Date   Arthritis    knees (07/11/2017)   CKD (chronic kidney disease), stage III (HCC)    lov dr bernardino sanford 04-23-2020 on chart   Colon polyp    Complication of anesthesia    post op afibe after cabg x 2 2018   Coronary artery disease    a.  CABGx3 in 06-Jun-2017.   Gout    Heart murmur    mild, saw dr waddell 03-26-2020 f/u prn   High cholesterol    Hypertension    Mild aortic stenosis    Mild mitral regurgitation    OSA on CPAP    last used cpap 3 weeks ago   Phimosis    Postoperative atrial fibrillation (HCC) 06/2017   Sleep apnea    uses CPAP   Type II diabetes mellitus [facility])     Past Surgical History:  Procedure Laterality Date   CARDIAC CATHETERIZATION     CIRCUMCISION N/A 06/27/2020   Procedure: CIRCUMCISION ADULT;  Surgeon: Devere Lonni Righter, MD;  Location: [facility];  Service: Urology;   Laterality: N/A;   COLONOSCOPY  2008   CORONARY ARTERY BYPASS GRAFT N/A 06/24/2017   Procedure: CORONARY ARTERY BYPASS GRAFTING (CABG) x 3 ; Using Left Internal Mammary Artery and Right Great Saphenous Leg Vein Harvested Endoscopically;  Surgeon: Lucas Dorise POUR, MD;  [facility];  Service: Open Heart Surgery;  Laterality: N/A;   LEFT HEART CATH AND CORONARY ANGIOGRAPHY N/A 05/25/2017   Procedure: LEFT HEART CATH AND CORONARY ANGIOGRAPHY;  Surgeon: Darron Deatrice LABOR, MD;  [facility]: [facility] INV[facility];  Service: Cardiovascular;  Laterality: N/A;   TEE WITHOUT CARDIOVERSION N/A 06/24/2017   Procedure: TRANSESOPHAGEAL ECHOCARDIOGRAM (TEE);  Surgeon: Lucas Dorise POUR, MD;  Location: [facility] OR;  Service: Open Heart Surgery;  Laterality: N/A;    Prior to Admission medications   Medication Sig Start Date End Date Taking? Authorizing Provider  aspirin  EC 81 MG tablet Take 1 tablet (81 mg total) by mouth daily. 09/02/17  Yes Dunn, Dayna N, PA-C  atorvastatin  (LIPITOR ) 80 MG tablet TAKE 1 TABLET BY MOUTH DAILY AT  6 PM 02/22/24  Yes Levora Reyes SAUNDERS, MD  chlorthalidone  (HYGROTON ) 25 MG tablet Take 0.5 tablets (12.5 mg total) by mouth every morning. 02/22/24  Yes Levora Reyes SAUNDERS, MD  glucose blood test strip Use as instructed 02/22/24  Yes Levora Reyes SAUNDERS, MD  hydrALAZINE  (APRESOLINE ) 50 MG tablet TAKE 1 TABLET BY MOUTH IN THE  MORNING AND 2  TABLETS BY MOUTH  IN THE EVENING 08/02/23  Yes Levora Reyes SAUNDERS, MD  irbesartan  (AVAPRO ) 300 MG tablet Take 1 tablet (300 mg total) by mouth daily. TAKE 1 TABLET BY MOUTH DAILY 11/25/23  Yes Levora Reyes SAUNDERS, MD  isosorbide  mononitrate (IMDUR ) 60 MG 24 hr tablet Take 1 tablet (60 mg total) by mouth at bedtime. 10/26/23  Yes Jeffrie Oneil BROCKS, MD  KERENDIA 10 MG TABS Take 1 tablet by mouth daily. 08/05/23  Yes [provider]  Lancets (ONETOUCH ULTRASOFT) lancets 1 each by Other route 1 day or 1 dose for 1 dose. Use as instructed 02/22/24 03/20/24 Yes Levora Reyes SAUNDERS, MD   metFORMIN  (GLUCOPHAGE ) 500 MG tablet Take 1 tablet (500 mg total) by mouth 2 (two) times daily with a meal. TAKE 1 TABLET BY MOUTH TWICE  DAILY WITH MEALS 02/22/24  Yes Levora Reyes SAUNDERS, MD  sitaGLIPtin  (JANUVIA ) 50 MG tablet Take 1 tablet (50 mg total) by mouth daily. 02/22/24  Yes Levora Reyes SAUNDERS, MD  acetaminophen  (TYLENOL ) 325 MG tablet Take 650 mg by mouth every 6 (six) hours as needed for mild pain.    [provider]    Current Outpatient Medications  Medication Sig Dispense Refill   aspirin  EC 81 MG tablet Take 1 tablet (81 mg total) by mouth daily. 90 tablet 3   atorvastatin  (LIPITOR ) 80 MG tablet TAKE 1 TABLET BY MOUTH DAILY AT  6 PM 90 tablet 1   chlorthalidone  (HYGROTON ) 25 MG tablet Take 0.5 tablets (12.5 mg total) by mouth every morning. 50 tablet 2   glucose blood test strip Use as instructed 100 each 12   hydrALAZINE  (APRESOLINE ) 50 MG tablet TAKE 1 TABLET BY MOUTH IN THE  MORNING AND 2 TABLETS BY MOUTH  IN THE EVENING 90 tablet 11   irbesartan  (AVAPRO ) 300 MG tablet Take 1 tablet (300 mg total) by mouth daily. TAKE 1 TABLET BY MOUTH DAILY 90 tablet 3   isosorbide  mononitrate (IMDUR ) 60 MG 24 hr tablet Take 1 tablet (60 mg total) by mouth at bedtime. 90 tablet 1   KERENDIA 10 MG TABS Take 1 tablet by mouth daily.     Lancets (ONETOUCH ULTRASOFT) lancets 1 each by Other route 1 day or 1 dose for 1 dose. Use as instructed 100 each 12   metFORMIN  (GLUCOPHAGE ) 500 MG tablet Take 1 tablet (500 mg total) by mouth 2 (two) times daily with a meal. TAKE 1 TABLET BY MOUTH TWICE  DAILY WITH MEALS 180 tablet 3   sitaGLIPtin  (JANUVIA ) 50 MG tablet Take 1 tablet (50 mg total) by mouth daily. 100 tablet 2   acetaminophen  (TYLENOL ) 325 MG tablet Take 650 mg by mouth every 6 (six) hours as needed for mild pain.     Current Facility-Administered Medications  Medication Dose Route Frequency Provider Last Rate Last Admin   0.9 %  sodium chloride  infusion  500 mL Intravenous Once  Angeles Zehner V, DO        Allergies as of 03/20/2024 - Review Complete 03/20/2024  Allergen Reaction Noted   Penicillins Other (See Comments) 08/12/2011   Beta adrenergic blockers Other (See Comments) 06/17/2020   Lisinopril Cough 08/12/2011   Amiodarone  Nausea And Vomiting 07/11/2017   Amlodipine  Other (See Comments) 10/19/2018   Cortisone Nausea And Vomiting and Other (See Comments) 05/26/2015   Oxycodone  Other (See Comments) 07/01/2017   Tramadol  Nausea And Vomiting and Other (See Comments) 02/04/2014   Zofran  [ondansetron  hcl] Other (See Comments) 07/07/2017  Family History  Problem Relation Age of Onset   Hypertension Mother    Kidney disease Mother    Diabetes Mother    Hypertension Father    Cancer Brother        lung   Colon cancer Brother 47   Hypertension Brother    Stroke Brother    Cancer Brother    Esophageal cancer Neg Hx    Rectal cancer Neg Hx    Stomach cancer Neg Hx    Colon polyps Neg Hx     Social History   Socioeconomic History   Marital status: Married    Spouse name: Rock   Number of children: 3   Years of education: 16   Highest education level: Bachelor's degree (e.g., BA, AB, BS)  Occupational History   Occupation: Musician  Tobacco Use   Smoking status: Former    Current packs/day: 0.00    Average packs/day: 2.0 packs/day for 15.0 years (30.0 ttl pk-yrs)    Types: Cigarettes    Start date: 07/27/1983    Quit date: 07/26/1998    Years since quitting: 25.6   Smokeless tobacco: Never  Vaping Use   Vaping status: Never Used  Substance and Sexual Activity   Alcohol use: Not Currently    Alcohol/week: 0.0 standard drinks of alcohol   Drug use: No   Sexual activity: Yes  Other Topics Concern   Not on file  Social History Narrative         Social Drivers of Health   Financial Resource Strain: Low Risk  (01/10/2024)   Overall Financial Resource Strain (CARDIA)    Difficulty of Paying Living Expenses: Not hard at all  Food  Insecurity: No Food Insecurity (01/10/2024)   Hunger Vital Sign    Worried About Running Out of Food in the Last Year: Never true    Ran Out of Food in the Last Year: Never true  Transportation Needs: No Transportation Needs (01/10/2024)   PRAPARE - Administrator, Civil Service (Medical): No    Lack of Transportation (Non-Medical): No  Physical Activity: Insufficiently Active (01/10/2024)   Exercise Vital Sign    Days of Exercise per Week: 1 day    Minutes of Exercise per Session: 10 min  Stress: No Stress Concern Present (01/10/2024)   [employer] of Occupational Health - Occupational Stress Questionnaire    Feeling of Stress: Not at all  Social Connections: Moderately Integrated (01/10/2024)   Social Connection and Isolation Panel    Frequency of Communication with Friends and Family: More than three times a week    Frequency of Social Gatherings with Friends and Family: Twice a week    Attends Religious Services: 1 to 4 times per year    Active Member of [employer] or Organizations: No    Attends Banker Meetings: Not on file    Marital Status: Married  Catering manager Violence: Not At Risk (01/10/2024)   Humiliation, Afraid, Rape, and Kick questionnaire    Fear of Current or Ex-Partner: No    Emotionally Abused: No    Physically Abused: No    Sexually Abused: No    Physical Exam: Vital signs in last 24 hours: @BP  126/61   Pulse 92   Temp (!) 97.3 F (36.3 C) (Temporal)   Ht 5' 2 (1.575 m)   Wt 181 lb (82.1 kg)   SpO2 96%   BMI 33.11 kg/m  GEN: NAD EYE: Sclerae anicteric ENT: MMM CV: Non-tachycardic Pulm:  CTA b/l GI: Soft, NT/ND NEURO:  Alert & Oriented x 3   Sandor Flatter, DO [facility]   03/20/2024 9:48 AM

## 2024-03-20 NOTE — Patient Instructions (Signed)

## 2024-03-20 NOTE — Progress Notes (Signed)
 Vss nad trans to pacu

## 2024-03-21 ENCOUNTER — Telehealth: Payer: Self-pay

## 2024-03-21 NOTE — Telephone Encounter (Signed)
 Left message on follow up call.

## 2024-03-27 ENCOUNTER — Ambulatory Visit: Payer: Self-pay | Admitting: Gastroenterology

## 2024-03-27 LAB — SURGICAL PATHOLOGY

## 2024-04-24 ENCOUNTER — Other Ambulatory Visit: Payer: Self-pay | Admitting: Family Medicine

## 2024-04-24 DIAGNOSIS — E785 Hyperlipidemia, unspecified: Secondary | ICD-10-CM

## 2024-04-24 DIAGNOSIS — I5032 Chronic diastolic (congestive) heart failure: Secondary | ICD-10-CM

## 2024-04-24 DIAGNOSIS — Z951 Presence of aortocoronary bypass graft: Secondary | ICD-10-CM

## 2024-04-24 DIAGNOSIS — I10 Essential (primary) hypertension: Secondary | ICD-10-CM

## 2024-04-24 MED ORDER — HYDRALAZINE HCL 50 MG PO TABS: ORAL_TABLET | ORAL | 11 refills | Status: DC

## 2024-04-24 NOTE — Telephone Encounter (Signed)
 Copied from CRM #[id number]. Topic: Clinical - Medication Refill >> Apr 24, 2024 12:20 PM [place] wrote: Medication: hydrALAZINE  (APRESOLINE ) 50 MG tablet  Has the patient contacted their pharmacy? Yes (Agent: If no, request that the patient contact the pharmacy for the refill. If patient does not wish to contact the pharmacy document the reason why and proceed with request.) (Agent: If yes, when and what did the pharmacy advise?)  This is the patient's preferred pharmacy:  [facility] - [place], [place] - [address] [place] [address] Phone: [phone] Fax: [phone]   Is this the correct pharmacy for this prescription? Yes If no, delete pharmacy and type the correct one.   Has the prescription been filled recently? Yes  Is the patient out of the medication? Yes  Has the patient been seen for an appointment in the last year OR does the patient have an upcoming appointment? Yes  Can we respond through MyChart? Yes  Agent: Please be advised that Rx refills may take up to 3 business days. We ask that you follow-up with your pharmacy.

## 2024-05-21 ENCOUNTER — Other Ambulatory Visit: Payer: Self-pay | Admitting: Family Medicine

## 2024-05-21 DIAGNOSIS — E1122 Type 2 diabetes mellitus with diabetic chronic kidney disease: Secondary | ICD-10-CM

## 2024-05-21 NOTE — Telephone Encounter (Unsigned)
 Copied from CRM [id number]. Topic: Clinical - Medication Refill >> May 21, 2024  3:20 PM Nessti S wrote: Medication: metFORMIN  (GLUCOPHAGE ) 500 MG tablet  Has the patient contacted their pharmacy? Yes (Agent: If no, request that the patient contact the pharmacy for the refill. If patient does not wish to contact the pharmacy document the reason why and proceed with request.) (Agent: If yes, when and what did the pharmacy advise?)  This is the patient's preferred pharmacy:  [facility] [phone] [place], [place] - [address] [place] [address] Phone: [phone] Fax: [phone]  Is this the correct pharmacy for this prescription? Yes If no, delete pharmacy and type the correct one.   Has the prescription been filled recently? No  Is the patient out of the medication? Yes  Has the patient been seen for an appointment in the last year OR does the patient have an upcoming appointment? Yes  Can we respond through MyChart? Yes  Agent: Please be advised that Rx refills may take up to 3 business days. We ask that you follow-up with your pharmacy.

## 2024-05-22 MED ORDER — METFORMIN HCL 500 MG PO TABS: 500.0000 mg | ORAL_TABLET | Freq: Two times a day (BID) | ORAL | 3 refills | Status: DC

## 2024-06-07 ENCOUNTER — Telehealth: Payer: Self-pay | Admitting: Cardiology

## 2024-06-07 MED ORDER — ISOSORBIDE MONONITRATE ER 60 MG PO TB24: 60.0000 mg | ORAL_TABLET | Freq: Every day | ORAL | 2 refills | Status: AC

## 2024-06-07 NOTE — Telephone Encounter (Signed)
 Pt's medication was sent to pt's pharmacy as requested. Confirmation received.

## 2024-06-07 NOTE — Telephone Encounter (Signed)
*  STAT* If patient is at the pharmacy, call can be transferred to refill team.   1. Which medications need to be refilled? (please list name of each medication and dose if known)   isosorbide  mononitrate (IMDUR ) 60 MG 24 hr tablet     2. Would you like to learn more about the convenience, safety, & potential cost savings by using the [facility]? No   3. Are you open to using the [facility] (Type [facility] No   4. Which pharmacy/location (including street and city if local pharmacy) is medication to be sent to?  [facility] - [place], [place] - [address]   5. Do they need a 30 day or 90 day supply? 90 day

## 2024-07-06 ENCOUNTER — Encounter: Payer: Self-pay | Admitting: Cardiology

## 2024-07-23 ENCOUNTER — Ambulatory Visit
Admission: EM | Admit: 2024-07-23 | Discharge: 2024-07-23 | Disposition: A | Discharge status: Home / Self Care | Payer: Medicare (Managed Care) | Attending: Family Medicine | Admitting: Family Medicine

## 2024-07-23 ENCOUNTER — Ambulatory Visit: Payer: Self-pay

## 2024-07-23 DIAGNOSIS — J069 Acute upper respiratory infection, unspecified: Secondary | ICD-10-CM

## 2024-07-23 DIAGNOSIS — N183 Chronic kidney disease, stage 3 unspecified: Secondary | ICD-10-CM

## 2024-07-23 MED ORDER — DOXYCYCLINE HYCLATE 100 MG PO CAPS: 100.0000 mg | ORAL_CAPSULE | Freq: Two times a day (BID) | ORAL | 0 refills | Status: DC

## 2024-07-23 MED ORDER — PROMETHAZINE-DM 6.25-15 MG/5ML PO SYRP: 5.0000 mL | ORAL_SOLUTION | Freq: Three times a day (TID) | ORAL | 0 refills | Status: DC | PRN

## 2024-07-23 NOTE — ED Provider Notes (Signed)
 " [facility], Television/film/[facility] - [facility]  Note:  This document was prepared using Conservation officer, historic buildings and may include unintentional dictation errors.  MRN: [id number] DOB: 1946/07/06  Subjective:   Antonio Wells is a 78 y.o. male presenting for 10-day history of persistent congestion, drainage, productive cough.  No fever, chest pain, shortness of breath or wheezing.  No history of asthma.  He does have a history of CKD stage III.  No smoking of any kind including cigarettes, cigars, vaping, marijuana use.    Current Outpatient Medications  Medication Instructions   acetaminophen  (TYLENOL ) 650 mg, Every 6 hours PRN   aspirin  EC 81 mg, Oral, Daily   atorvastatin  (LIPITOR ) 80 MG tablet TAKE 1 TABLET BY MOUTH DAILY AT  6 PM   chlorthalidone  (HYGROTON ) 12.5 mg, Oral, Every morning   glucose blood test strip Use as instructed   hydrALAZINE  (APRESOLINE ) 50 MG tablet TAKE 1 TABLET BY MOUTH IN THE  MORNING AND 2 TABLETS BY MOUTH  IN THE EVENING   irbesartan  (AVAPRO ) 300 mg, Oral, Daily, TAKE 1 TABLET BY MOUTH DAILY   isosorbide  mononitrate (IMDUR ) 60 mg, Oral, Daily at bedtime   KERENDIA 10 MG TABS 1 tablet, Daily   Lancets (ONETOUCH ULTRASOFT) lancets 1 each, Other, 1 Day/Dose, Use as instructed   metFORMIN  (GLUCOPHAGE ) 500 mg, Oral, 2 times daily with meals, TAKE 1 TABLET BY MOUTH TWICE  DAILY WITH MEALS   sitaGLIPtin  (JANUVIA ) 50 mg, Oral, Daily    Allergies[1]  Past Medical History:  Diagnosis Date   Arthritis    knees (07/11/2017)   CKD (chronic kidney disease), stage III (HCC)    lov dr bernardino sanford 04-23-2020 on chart   Colon polyp    Complication of anesthesia    post op afibe after cabg x 2 2018   Coronary artery disease    a.  CABGx3 in 05/2017.   Gout    Heart murmur    mild, saw dr waddell 03-26-2020 f/u prn   High cholesterol    Hypertension    Mild aortic stenosis    Mild mitral regurgitation    OSA on CPAP    last used cpap 3 weeks ago   Phimosis     Postoperative atrial fibrillation (HCC) 06/2017   Sleep apnea    uses CPAP   Type II diabetes mellitus [facility])      Past Surgical History:  Procedure Laterality Date   CARDIAC CATHETERIZATION     CIRCUMCISION N/A 06/27/2020   Procedure: CIRCUMCISION ADULT;  Surgeon: Devere Lonni Righter, MD;  Location: [facility];  Service: Urology;  Laterality: N/A;   COLONOSCOPY  2008   CORONARY ARTERY BYPASS GRAFT N/A 06/24/2017   Procedure: CORONARY ARTERY BYPASS GRAFTING (CABG) x 3 ; Using Left Internal Mammary Artery and Right Great Saphenous Leg Vein Harvested Endoscopically;  Surgeon: Lucas Dorise POUR, MD;  Location: [facility];  Service: Open Heart Surgery;  Laterality: N/A;   LEFT HEART CATH AND CORONARY ANGIOGRAPHY N/A 05/25/2017   Procedure: LEFT HEART CATH AND CORONARY ANGIOGRAPHY;  Surgeon: Darron Deatrice LABOR, MD;  [facility];  Service: Cardiovascular;  Laterality: N/A;   TEE WITHOUT CARDIOVERSION N/A 06/24/2017   Procedure: TRANSESOPHAGEAL ECHOCARDIOGRAM (TEE);  Surgeon: Lucas Dorise POUR, MD;  Location: [facility] OR;  Service: Open Heart Surgery;  Laterality: N/A;    Family History  Problem Relation Age of Onset   Hypertension Mother    Kidney disease Mother  Diabetes Mother    Hypertension Father    Cancer Brother        lung   Colon cancer Brother 56   Hypertension Brother    Stroke Brother    Cancer Brother    Esophageal cancer Neg Hx    Rectal cancer Neg Hx    Stomach cancer Neg Hx    Colon polyps Neg Hx     Social History   Occupational History   Occupation: Musician  Tobacco Use   Smoking status: Former    Current packs/day: 0.00    Average packs/day: 2.0 packs/day for 15.0 years (30.0 ttl pk-yrs)    Types: Cigarettes    Start date: 07/27/1983    Quit date: 07/26/1998    Years since quitting: 26.0   Smokeless tobacco: Never  Vaping Use   Vaping status: Never Used  Substance and Sexual Activity   Alcohol use: Not Currently     Alcohol/week: 0.0 standard drinks of alcohol   Drug use: No   Sexual activity: Yes     ROS   Objective:   Vitals: BP (!) 153/65 (BP Location: Right Arm)   Pulse 97   Temp 98.8 F (37.1 C) (Oral)   Resp 18   SpO2 94%   Physical Exam Constitutional:      General: He is not in acute distress.    Appearance: Normal appearance. He is well-developed and normal weight. He is not ill-appearing, toxic-appearing or diaphoretic.  HENT:     Head: Normocephalic and atraumatic.     Right Ear: Tympanic membrane, ear canal and external ear normal. No drainage, swelling or tenderness. No middle ear effusion. There is no impacted cerumen. Tympanic membrane is not erythematous or bulging.     Left Ear: Tympanic membrane, ear canal and external ear normal. No drainage, swelling or tenderness.  No middle ear effusion. There is no impacted cerumen. Tympanic membrane is not erythematous or bulging.     Nose: Congestion present. No rhinorrhea.     Mouth/Throat:     Mouth: Mucous membranes are moist.     Pharynx: Posterior oropharyngeal erythema (with associated postnasal drainage overlying pharynx) present. No pharyngeal swelling, oropharyngeal exudate or uvula swelling.     Tonsils: No tonsillar exudate or tonsillar abscesses. 0 on the right. 0 on the left.  Eyes:     General: No scleral icterus.       Right eye: No discharge.        Left eye: No discharge.     Extraocular Movements: Extraocular movements intact.     Conjunctiva/sclera: Conjunctivae normal.  Cardiovascular:     Rate and Rhythm: Normal rate and regular rhythm.     Heart sounds: Normal heart sounds. No murmur heard.    No friction rub. No gallop.  Pulmonary:     Effort: Pulmonary effort is normal. No respiratory distress.     Breath sounds: Normal breath sounds. No stridor. No wheezing, rhonchi or rales.  Musculoskeletal:     Cervical back: Normal range of motion and neck supple. No rigidity. No muscular tenderness.   Neurological:     General: No focal deficit present.     Mental Status: He is alert and oriented to person, place, and time.  Psychiatric:        Mood and Affect: Mood normal.        Behavior: Behavior normal.        Thought Content: Thought content normal.  Judgment: Judgment normal.     Assessment and Plan :   PDMP not reviewed this encounter.  1. Acute upper respiratory infection   2. Stage 3 chronic kidney disease, unspecified whether stage 3a or 3b CKD (HCC)      Recommend managing for acute upper respiratory infection with doxycycline , supportive care.  Deferred imaging given clear cardiopulmonary exam, hemodynamically stable vital signs.  Counseled patient on potential for adverse effects with medications prescribed/recommended today, ER and return-to-clinic precautions discussed, patient verbalized understanding.     [1]  Allergies Allergen Reactions   Penicillins Other (See Comments)    Has patient had a PCN reaction causing immediate rash, facial/tongue/throat swelling, SOB or lightheadedness with hypotension: No Has patient had a PCN reaction causing severe rash involving mucus membranes or skin necrosis: No Has patient had a PCN reaction that required hospitalization: No Has patient had a PCN reaction occurring within the last 10 years: No If all of the above answers are NO, then may proceed with Cephalosporin use.   Passed out ? SYNCOPE ?   Beta Adrenergic Blockers Other (See Comments)    Do not take beta blockers causes heart block with coreg    Lisinopril Cough   Amiodarone  Nausea And Vomiting   Amlodipine  Other (See Comments)    Causes dizzines   Cortisone Nausea And Vomiting and Other (See Comments)    HICCUPS   Oxycodone  Other (See Comments)    hallucinations   Tramadol  Nausea And Vomiting and Other (See Comments)    HICCUPS   Zofran  [Ondansetron  Hcl] Other (See Comments)    Has an interaction with another medication pt takes      Christopher Savannah, PA-C 07/23/24 1425  "

## 2024-07-23 NOTE — Telephone Encounter (Signed)
 FYI Only or Action Required?: FYI only for provider: PT will go to UC.  Patient was last seen in primary care on 02/22/2024 by Levora Reyes SAUNDERS, MD.  Called Nurse Triage reporting Cough.  Symptoms began a week ago.  Interventions attempted: OTC medications: Many different OTC meds.  Symptoms are: unchanged.  Triage Disposition: See Physician Within 24 Hours  Patient/caregiver understands and will follow disposition?: Yes                          Copied from CRM #[id number]. Topic: Clinical - Red Word Triage >> Jul 23, 2024  9:50 AM Alfonso ORN wrote: Red Word that prompted transfer to Nurse Triage: pt has a bad cough that has lasted a week. worsening Reason for Disposition  [1] Continuous (nonstop) coughing interferes with work or school AND [2] no improvement using cough treatment per Care Advice  Answer Assessment - Initial Assessment Questions 1. ONSET: When did the cough begin?      Last Sunday 2. SEVERITY: How bad is the cough today?      Every few minutes 3. SPUTUM: Describe the color of your sputum (e.g., none, dry cough; clear, white, yellow, green)     Only 1-2 times in the past few days - yellowish greenish 4. HEMOPTYSIS: Are you coughing up any blood? If Yes, ask: How much? (e.g., flecks, streaks, tablespoons, etc.)     no 5. DIFFICULTY BREATHING: Are you having difficulty breathing? If Yes, ask: How bad is it? (e.g., mild, moderate, severe)      No - nose is stuffy 6. FEVER: Do you have a fever? If Yes, ask: What is your temperature, how was it measured, and when did it start?     no 7. CARDIAC HISTORY: Do you have any history of heart disease? (e.g., heart attack, congestive heart failure)      Open heart surgery - triple bypass 8. LUNG HISTORY: Do you have any history of lung disease?  (e.g., pulmonary embolus, asthma, emphysema)     no 10. OTHER SYMPTOMS: Do you have any other symptoms? (e.g., runny nose, wheezing, chest  pain)       Sinus  Protocols used: Cough - Acute Non-Productive-A-AH

## 2024-07-23 NOTE — Telephone Encounter (Signed)
 FYI - patient is going to UC to address concerns.

## 2024-07-23 NOTE — ED Triage Notes (Signed)
 Pt reports cough x 1 week. OTC meds gives no relief.

## 2024-07-23 NOTE — Discharge Instructions (Signed)
 Start doxycycline  for an upper respiratory infection. Use Tylenol  for aches and pains. Use nasal saline rinses and cough syrup as needed.

## 2024-07-31 ENCOUNTER — Ambulatory Visit (HOSPITAL_COMMUNITY)
Admission: RE | Admit: 2024-07-31 | Discharge: 2024-07-31 | Disposition: A | Discharge status: Home / Self Care | Payer: Medicare (Managed Care) | Source: Ambulatory Visit | Attending: Cardiology | Admitting: Cardiology

## 2024-07-31 DIAGNOSIS — E785 Hyperlipidemia, unspecified: Secondary | ICD-10-CM | POA: Diagnosis present

## 2024-07-31 DIAGNOSIS — I119 Hypertensive heart disease without heart failure: Secondary | ICD-10-CM | POA: Diagnosis present

## 2024-07-31 DIAGNOSIS — R001 Bradycardia, unspecified: Secondary | ICD-10-CM | POA: Insufficient documentation

## 2024-07-31 DIAGNOSIS — I350 Nonrheumatic aortic (valve) stenosis: Secondary | ICD-10-CM

## 2024-07-31 DIAGNOSIS — Z951 Presence of aortocoronary bypass graft: Secondary | ICD-10-CM | POA: Diagnosis present

## 2024-07-31 LAB — ECHOCARDIOGRAM COMPLETE
AR max vel: 0.95 cm2
AV Area VTI: 1.17 cm2
AV Area mean vel: 0.97 cm2
AV Mean grad: 19 mmHg
AV Peak grad: 36.2 mmHg
Ao pk vel: 3.01 m/s
Area-P 1/2: 3.07 cm2
S' Lateral: 2.6 cm

## 2024-08-03 ENCOUNTER — Encounter: Payer: Self-pay | Admitting: Physician Assistant

## 2024-08-03 ENCOUNTER — Ambulatory Visit: Payer: Self-pay | Admitting: Physician Assistant

## 2024-08-03 DIAGNOSIS — I350 Nonrheumatic aortic (valve) stenosis: Secondary | ICD-10-CM

## 2024-08-29 ENCOUNTER — Ambulatory Visit: Payer: Medicare (Managed Care) | Admitting: Family Medicine

## 2024-08-29 ENCOUNTER — Encounter: Payer: Self-pay | Admitting: Family Medicine

## 2024-08-29 VITALS — BP 130/80 | HR 80 | Temp 98.1°F | Resp 18 | Ht 62.0 in | Wt 183.6 lb

## 2024-08-29 DIAGNOSIS — K760 Fatty (change of) liver, not elsewhere classified: Secondary | ICD-10-CM

## 2024-08-29 DIAGNOSIS — Z951 Presence of aortocoronary bypass graft: Secondary | ICD-10-CM

## 2024-08-29 DIAGNOSIS — E785 Hyperlipidemia, unspecified: Secondary | ICD-10-CM

## 2024-08-29 DIAGNOSIS — I5032 Chronic diastolic (congestive) heart failure: Secondary | ICD-10-CM

## 2024-08-29 DIAGNOSIS — E1122 Type 2 diabetes mellitus with diabetic chronic kidney disease: Secondary | ICD-10-CM

## 2024-08-29 DIAGNOSIS — Z0000 Encounter for general adult medical examination without abnormal findings: Secondary | ICD-10-CM

## 2024-08-29 DIAGNOSIS — I10 Essential (primary) hypertension: Secondary | ICD-10-CM

## 2024-08-29 DIAGNOSIS — I2510 Atherosclerotic heart disease of native coronary artery without angina pectoris: Secondary | ICD-10-CM

## 2024-08-29 LAB — LIPID PANEL
Cholesterol: 132 mg/dL (ref 28–200)
HDL: 58.6 mg/dL
LDL Cholesterol: 52 mg/dL (ref 10–99)
NonHDL: 73.31
Total CHOL/HDL Ratio: 2
Triglycerides: 107 mg/dL (ref 10.0–149.0)
VLDL: 21.4 mg/dL (ref 0.0–40.0)

## 2024-08-29 LAB — COMPREHENSIVE METABOLIC PANEL WITH GFR
ALT: 13 U/L (ref 3–53)
AST: 17 U/L (ref 5–37)
Albumin: 4.7 g/dL (ref 3.5–5.2)
Alkaline Phosphatase: 59 U/L (ref 39–117)
BUN: 27 mg/dL — ABNORMAL HIGH (ref 6–23)
CO2: 27 meq/L (ref 19–32)
Calcium: 9 mg/dL (ref 8.4–10.5)
Chloride: 106 meq/L (ref 96–112)
Creatinine, Ser: 1.88 mg/dL — ABNORMAL HIGH (ref 0.40–1.50)
GFR: 34.01 mL/min — ABNORMAL LOW
Glucose, Bld: 76 mg/dL (ref 70–99)
Potassium: 4.5 meq/L (ref 3.5–5.1)
Sodium: 144 meq/L (ref 135–145)
Total Bilirubin: 0.6 mg/dL (ref 0.2–1.2)
Total Protein: 7.3 g/dL (ref 6.0–8.3)

## 2024-08-29 LAB — HEMOGLOBIN A1C: Hgb A1c MFr Bld: 6.4 % (ref 4.6–6.5)

## 2024-08-29 MED ORDER — ATORVASTATIN CALCIUM 80 MG PO TABS: ORAL_TABLET | ORAL | 1 refills | Status: AC

## 2024-08-29 MED ORDER — IRBESARTAN 300 MG PO TABS: 300.0000 mg | ORAL_TABLET | Freq: Every day | ORAL | 3 refills | Status: AC

## 2024-08-29 MED ORDER — HYDRALAZINE HCL 50 MG PO TABS: ORAL_TABLET | ORAL | 3 refills | Status: AC

## 2024-08-29 MED ORDER — METFORMIN HCL 500 MG PO TABS: 500.0000 mg | ORAL_TABLET | Freq: Two times a day (BID) | ORAL | 3 refills | Status: AC

## 2024-08-29 MED ORDER — CHLORTHALIDONE 25 MG PO TABS: 12.5000 mg | ORAL_TABLET | Freq: Every morning | ORAL | 2 refills | Status: AC

## 2024-08-29 MED ORDER — SITAGLIPTIN PHOSPHATE 50 MG PO TABS: 50.0000 mg | ORAL_TABLET | Freq: Every day | ORAL | 2 refills | Status: AC

## 2024-08-29 NOTE — Patient Instructions (Signed)
 Thank you for coming in today. No change in medications at this time, but I did provide some information below on Rybelsus if you would like to consider that medication in place of Januvia .  Let me know.  If there are any concerns on your bloodwork, I will let you know.  If shoulder discomfort does not improve with some range of motion/stretches or changing position with sleep, please see me within the next week or 2.  Take care!  Semaglutide Tablets What is this medication? SEMAGLUTIDE (SEM a GLOO tide) treats type 2 diabetes. It works by increasing insulin  levels in your body, which decreases your blood sugar (glucose). It also reduces the amount of sugar released into your blood and slows down your digestion. It may also be used to lower the risk of heart attack and stroke in people with type 2 diabetes. Changes to diet are often combined with this medication. This medicine may be used for other purposes; ask your health care provider or pharmacist if you have questions. COMMON BRAND NAME(S): Rybelsus, Rybelsus (Formulation R2) What should I tell my care team before I take this medication? They need to know if you have any of these conditions: Endocrine tumors (MEN 2) or if someone in your family had these tumors Eye disease History of pancreatitis Kidney disease Stomach or intestine problems Thyroid  cancer or if someone in your family had thyroid  cancer Vision problems An unusual or allergic reaction to semaglutide, other medications, foods, dyes, or preservatives Pregnant or trying to get pregnant Breast-feeding How should I use this medication? Take this medication by mouth with a sip of water (no more than 4 ounces). Take it in the morning right after waking up every day. Take it on an empty stomach, at least 30 minutes before food, drink, or other medications. Do not cut, crush, or chew this medication. Swallow the tablets whole. Keep taking it unless your care team tells you to stop. A  special MedGuide will be given to you by the pharmacist with each prescription and refill. Be sure to read this information carefully each time. Talk to your care team about the use of this medication in children. Special care may be needed. Overdosage: If you think you have taken too much of this medicine contact a poison control center or emergency room at once. NOTE: This medicine is only for you. Do not share this medicine with others. What if I miss a dose? If you miss a dose, skip it. Take your next dose at the normal time. Do not take extra or 2 doses at the same time to make up for the missed dose. What may interact with this medication? Digoxin Levothyroxine Warfarin Some medications may affect your blood sugar levels or hide the symptoms of low blood sugar (hypoglycemia). Talk with your care team about all the medications you take. They may suggest changes to your insulin  dose or checking your blood sugar levels more often. Medications that may affect your blood sugar levels include: Alcohol Certain antibiotics, such as ciprofloxacin, levofloxacin , sulfamethoxazole; trimethoprim Certain medications for blood pressure or heart disease, such as benazepril, enalapril, lisinopril, losartan , valsartan Certain medications for mental health conditions, such as fluoxetine or olanzapine Diuretics, such as hydrochlorothiazide  (HCTZ) Estrogen and progestin hormones Other medications for diabetes Steroid medications, such as prednisone  or cortisone Testosterone Thyroid  hormones Medications that may mask symptoms of low blood sugar include: Beta blockers, such as atenolol, metoprolol , propranolol Clonidine Guanethidine Reserpine This list may not describe all possible  interactions. Give your health care provider a list of all the medicines, herbs, non-prescription drugs, or dietary supplements you use. Also tell them if you smoke, drink alcohol, or use illegal drugs. Some items may interact  with your medicine. What should I watch for while using this medication? Visit your care team for regular checks on your progress. Tell your care team if your symptoms do not start to get better or if they get worse. You may need blood work done while you are taking this medication. Your care team will monitor your HbA1C (A1C). This test shows what your average blood sugar (glucose) level was over the past 2 to 3 months. Know the symptoms of low blood sugar and know how to treat it. Always carry a source of quick sugar with you. Examples include hard sugar candy or glucose tablets. Make sure others know that you can choke if you eat or drink if your blood sugar is too low and you are unable to care for yourself. Get medical help at once. Tell your care team if you have high blood sugar. Your medication dose may change if your body is under stress. Some types of stress that may affect your blood sugar include fever, infection, and surgery. Wear a medical ID bracelet or chain. Carry a card that describes your condition. List the medications and doses you take on the card. Talk to your care team about your risk of cancer. You may be more at risk for certain types of cancer if you take this medication. Talk to your care team right away if you have a lump or swelling in your neck, hoarseness that does not go away, trouble swallowing, shortness of breath, or trouble breathing. Make sure you stay hydrated while taking this medication. Drink water often. Eat fruits and veggies that have a high water content. Drink more water when it is hot or you are active. Talk to your care team right away if you have fever, infection, vomiting, diarrhea, or if you sweat a lot while taking this medication. The loss of too much body fluid may make it dangerous for you to take this medication. If you are going to need surgery or a procedure, tell your care team that you are taking this medication. Do not take this medication  without first talking to your care team if you may be or could become pregnant. Your care team can help you find the option that works for you. Weight loss is not recommended during pregnancy. Maintaining healthy blood sugar levels can help reduce the risk of pregnancy complications. Talk to your care team if you are breastfeeding. When recommended, this medication may be taken. Its use during breastfeeding has not been well studied. Lactation may help lower your blood sugar levels. Your care team may recommend changes to your treatment plan. What side effects may I notice from receiving this medication? Side effects that you should report to your care team as soon as possible: Allergic reactions--skin rash, itching, hives, swelling of the face, lips, tongue, or throat Change in vision Dehydration--increased thirst, dry mouth, feeling faint or lightheaded, headache, dark yellow or brown urine Gallbladder problems--severe stomach pain, nausea, vomiting, fever Kidney injury--decrease in the amount of urine, swelling of the ankles, hands, or feet Pancreatitis--severe stomach pain that spreads to your back or gets worse after eating or when touched, fever, nausea, vomiting Thyroid  cancer--new mass or lump in the neck, pain or trouble swallowing, trouble breathing, hoarseness Side effects that usually do  not require medical attention (report these to your care team if they continue or are bothersome): Constipation Diarrhea Loss of appetite Nausea Stomach pain Vomiting This list may not describe all possible side effects. Call your doctor for medical advice about side effects. You may report side effects to FDA at 1-800-FDA-1088. Where should I keep my medication? Keep out of the reach of children and pets. Store at room temperature between 20 and 25 degrees C (68 and 77 degrees F). Keep this medication in the original container. Protect from moisture. Keep the container tightly closed. Get rid of any  unused medication after the expiration date. To get rid of medications that are no longer needed or have expired: Take the medication to a medication take-back program. Check with your pharmacy or law enforcement to find a location. If you cannot return the medication, check the label or package insert to see if the medication should be thrown out in the garbage or flushed down the toilet. If you are not sure, ask your care team. If it is safe to put it in the trash, take the medication out of the container. Mix the medication with cat litter, dirt, coffee grounds, or other unwanted substance. Seal the mixture in a bag or container. Put it in the trash. NOTE: This sheet is a summary. It may not cover all possible information. If you have questions about this medicine, talk to your doctor, pharmacist, or health care provider.  2025 Elsevier/Gold Standard (2024-05-16 00:00:00)  Preventive Care 65 Years and Older, Male Preventive care refers to lifestyle choices and visits with your health care provider that can promote health and wellness. Preventive care visits are also called wellness exams. What can I expect for my preventive care visit? Counseling During your preventive care visit, your health care provider may ask about your: Medical history, including: Past medical problems. Family medical history. History of falls. Current health, including: Emotional well-being. Home life and relationship well-being. Sexual activity. Memory and ability to understand (cognition). Lifestyle, including: Alcohol, nicotine or tobacco, and drug use. Access to firearms. Diet, exercise, and sleep habits. Work and work astronomer. Sunscreen use. Safety issues such as seatbelt and bike helmet use. Physical exam Your health care provider will check your: Height and weight. These may be used to calculate your BMI (body mass index). BMI is a measurement that tells if you are at a healthy weight. Waist  circumference. This measures the distance around your waistline. This measurement also tells if you are at a healthy weight and may help predict your risk of certain diseases, such as type 2 diabetes and high blood pressure. Heart rate and blood pressure. Body temperature. Skin for abnormal spots. What immunizations do I need?  Vaccines are usually given at various ages, according to a schedule. Your health care provider will recommend vaccines for you based on your age, medical history, and lifestyle or other factors, such as travel or where you work. What tests do I need? Screening Your health care provider may recommend screening tests for certain conditions. This may include: Lipid and cholesterol levels. Diabetes screening. This is done by checking your blood sugar (glucose) after you have not eaten for a while (fasting). Hepatitis C test. Hepatitis B test. HIV (human immunodeficiency virus) test. STI (sexually transmitted infection) testing, if you are at risk. Lung cancer screening. Colorectal cancer screening. Prostate cancer screening. Abdominal aortic aneurysm (AAA) screening. You may need this if you are a current or former smoker. Talk with your  health care provider about your test results, treatment options, and if necessary, the need for more tests. Follow these instructions at home: Eating and drinking  Eat a diet that includes fresh fruits and vegetables, whole grains, lean protein, and low-fat dairy products. Limit your intake of foods with high amounts of sugar, saturated fats, and salt. Take vitamin and mineral supplements as recommended by your health care provider. Do not drink alcohol if your health care provider tells you not to drink. If you drink alcohol: Limit how much you have to 0-2 drinks a day. Know how much alcohol is in your drink. In the [place] one drink equals one 12 oz bottle of beer (355 mL), one 5 oz glass of wine (148 mL), or one 1 oz glass of hard  liquor (44 mL). Lifestyle Brush your teeth every morning and night with fluoride toothpaste. Floss one time each day. Exercise for at least 30 minutes 5 or more days each week. Do not use any products that contain nicotine or tobacco. These products include cigarettes, chewing tobacco, and vaping devices, such as e-cigarettes. If you need help quitting, ask your health care provider. Do not use drugs. If you are sexually active, practice safe sex. Use a condom or other form of protection to prevent STIs. Take aspirin  only as told by your health care provider. Make sure that you understand how much to take and what form to take. Work with your health care provider to find out whether it is safe and beneficial for you to take aspirin  daily. Ask your health care provider if you need to take a cholesterol-lowering medicine (statin). Find healthy ways to manage stress, such as: Meditation, yoga, or listening to music. Journaling. Talking to a trusted person. Spending time with friends and family. Safety Always wear your seat belt while driving or riding in a vehicle. Do not drive: If you have been drinking alcohol. Do not ride with someone who has been drinking. When you are tired or distracted. While texting. If you have been using any mind-altering substances or drugs. Wear a helmet and other protective equipment during sports activities. If you have firearms in your house, make sure you follow all gun safety procedures. Minimize exposure to UV radiation to reduce your risk of skin cancer. What's next? Visit your health care provider once a year for an annual wellness visit. Ask your health care provider how often you should have your eyes and teeth checked. Stay up to date on all vaccines. This information is not intended to replace advice given to you by your health care provider. Make sure you discuss any questions you have with your health care provider. Document Revised: 01/07/2021  Document Reviewed: 01/07/2021 Elsevier Patient Education  2024 [employer].

## 2024-08-29 NOTE — Progress Notes (Unsigned)
 "  Subjective:  Patient ID: Antonio Wells, male    DOB: 11/30/1945  Age: 79 y.o. MRN: [id number]  CC:  Chief Complaint  Patient presents with   Annual Exam    No questions or concerns. Doing well    HPI Antonio Wells presents for Annual Exam  PCP, me Cardiologist: Dr. Jeffrie, hypertensive heart disease, CAD status post CABG in 2018, chronic diastolic CHF, hyperlipidemia, hypertension.  Echo for nonrheumatic aortic valve stenosis completed on 07/31/2024.  Increase in aortic valve gradient but not in severe range, continue monitoring planned.  Repeat echo in 6 months.  EF was 65 to 70%. Electrophysiology, Dr. Waddell, Previously seen with bradycardia that resolved after stopping beta-blocker. Nephrology, Dr. Marlee with hypertensive chronic kidney disease stage III.  Chlorthalidone , isosorbide , hydralazine , irbesartan .  Intolerant of SGLT2's due to UTIs, also on Kerendia. Nephrology appointment 08/24/2024.  CKD stage IIIb.  Recommendation to consider oral semaglutide as a replacement to Januvia  given additional benefits of GLP-1 agonist.  Continued Kerendia 10 mg daily, chlorthalidone  12.5 mg daily, isosorbide  mononitrate 60 mg daily, hydralazine  100 mg 3 times daily, irbesartan  300 mg daily..  Follow-up in 1 year. Gastroenterology, Dr. San, colonoscopy 03/20/2024.  Benign hyperplastic polyp, no repeat colonoscopy recommended.  Diabetes: Complicated by CKD, CAD, treated with Januvia , metformin  and intolerant to SGLT2's as above.  He is on statin and ARB followed by cardiology, nephrology as above.  Last A1c was stable in July.  No FH of MEN syndrome or medullary thyroid  cancer, no personal hx pancreatitis. Not ready to start rybelsus - would like to think about it. Declines injectable meds. Metformin  BID, januvia  every day, no new side effects.  Home readings: 105 range.  No symptomatic lows. No 200's.  Microalbumin: 79 on 08/16/24 - nephrology.   Optho, foot exam, pneumovax:  Pneumococcal vaccine has been declined, as well as shingrix.  Declined foot exam today.   Lab Results  Component Value Date   HGBA1C 6.6 (H) 02/22/2024   HGBA1C 7.1 (H) 08/22/2023   HGBA1C 6.7 (H) 05/05/2022   Lab Results  Component Value Date   MICROALBUR 10.1 (H) 02/22/2024   LDLCALC 56 02/22/2024   CREATININE 1.70 (H) 02/22/2024   Hypertension: As above with history of CAD, CABG, chronic diastolic CHF.  History of sleep apnea, but had stopped use of CPAP when discussed last July.meds above - no new side effects.  Home readings: 130/170-80 range.  BP Readings from Last 3 Encounters:  08/29/24 130/80  07/23/24 (!) 153/65  03/20/24 134/68   Lab Results  Component Value Date   CREATININE 1.70 (H) 02/22/2024   Hyperlipidemia: Cardiac history as above.  Lipitor  80 mg daily.  Some muscle aches at times in legs - not exercising. Intermittent. Plans trial of increased exercise then follow up if persistent.  Lab Results  Component Value Date   CHOL 136 02/22/2024   HDL 62.40 02/22/2024   LDLCALC 56 02/22/2024   TRIG 86.0 02/22/2024   CHOLHDL 2 02/22/2024   Lab Results  Component Value Date   ALT 13 02/22/2024   AST 17 02/22/2024   ALKPHOS 51 02/22/2024   BILITOT 0.5 02/22/2024   Hepatic steatosis Followed by gastroenterology with ultrasound elastography, LFTs, CBC monitored every 6 months. elastography last June with increased echogenicity with no focal abnormality, K PA 8 and medium category, avoidance of alcohol and repeating labs and CBC every 6 months with work on weight loss, increased fruits veggies and exercise.  No recent diet changes,  other than eating more fruit. No regular exercise - waiting for spring. Riding bike.  Lab Results  Component Value Date   ALT 13 02/22/2024   AST 17 02/22/2024   ALKPHOS 51 02/22/2024   BILITOT 0.5 02/22/2024    Lab Results  Component Value Date   WBC 4.6 08/22/2023   HGB 13.4 08/22/2023   HCT 41.2 08/22/2023   MCV 96.5  08/22/2023   PLT 182.0 08/22/2023       08/29/2024    1:14 PM 02/22/2024    1:21 PM 01/10/2024    9:45 AM 08/22/2023    2:49 PM 01/06/2023    1:42 PM  Depression screen PHQ 2/9  Decreased Interest 0 0 0 0 0  Down, Depressed, Hopeless 0 0 0 0 0  PHQ - 2 Score 0 0 0 0 0  Altered sleeping 0 0 0 0 0  Tired, decreased energy 0 0 0 0 0  Change in appetite 0 0 0 0 0  Feeling bad or failure about yourself  0 0 0 0 0  Trouble concentrating 0 0 0 0 0  Moving slowly or fidgety/restless 0 0 0 0 0  Suicidal thoughts 0 0 0 0 0  PHQ-9 Score 0 0  0  0  0   Difficult doing work/chores Not difficult at all Not difficult at all Not difficult at all  Not difficult at all     Data saved with a previous flowsheet row definition    Health Maintenance  Topic Date Due   Diabetic kidney evaluation - Urine ACR  08/24/2024   HEMOGLOBIN A1C  08/24/2024   COVID-19 Vaccine (8 - 2025-26 season) 09/14/2024 (Originally 03/26/2024)   Zoster Vaccines- Shingrix (1 of 2) 11/26/2024 (Originally 01/12/1997)   Pneumococcal Vaccine: 50+ Years (2 of 2 - PCV) 01/09/2025 (Originally 07/03/2013)   FOOT EXAM  02/26/2025 (Originally 08/21/2024)   OPHTHALMOLOGY EXAM  11/30/2024   Medicare Annual Wellness (AWV)  01/09/2025   Diabetic kidney evaluation - eGFR measurement  02/21/2025   Influenza Vaccine  Completed   Hepatitis C Screening  Completed   Meningococcal B Vaccine  Aged Out   DTaP/Tdap/Td  Discontinued   Colonoscopy  Discontinued  Colonoscopy - recnt as above - no repeat needed.   Immunization History  Administered Date(s) Administered   Fluad Quad(high Dose 65+) 04/07/2022   INFLUENZA, HIGH DOSE SEASONAL PF 03/20/2019, 06/18/2024   Influenza, Seasonal, Injecte, Preservative Fre 07/03/2012   Influenza,inj,Quad PF,6+ Mos 08/25/2015, 05/12/2017, 05/29/2018   Influenza-Unspecified 07/24/2016, 04/23/2020, 05/05/2021, 08/20/2022, 04/15/2023   PFIZER Comirnaty(Gray Top)Covid-19 Tri-Sucrose Vaccine 12/04/2020    PFIZER(Purple Top)SARS-COV-2 Vaccination 08/31/2019, 09/21/2019, 04/02/2020, 12/04/2020, 04/16/2021   Pfizer Covid-19 Vaccine Bivalent Booster 1yrs & up 04/21/2021   Pfizer(Comirnaty)Fall Seasonal Vaccine 12 years and older 04/20/2022   Pneumococcal Polysaccharide-23 07/03/2012   Tdap 07/03/2012   Unspecified SARS-COV-2 Vaccination 03/30/2023  Declines vaccines as above.   No results found.wears glasses - appt last year with optho. L eye cataract.   Dental: every 6 months.   Alcohol: none  Tobacco: none  Exercise:less recently as above.  Some right shoulder discomfort recently but no known injury, plans to change sleeping position and follow-up in the next week or 2 if not improving with this change as well as range of motion.   History Patient Active Problem List   Diagnosis Date Noted   Syncope 02/15/2020   Bradycardia 02/15/2020   Hypertensive encephalopathy 10/19/2018   Chronic kidney disease, stage 3 10/19/2018   DM (  diabetes mellitus), type 2 with renal complications (HCC) 10/19/2018   Cough 03/17/2018   Lower resp. 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   CAD (coronary artery disease) 05/25/2017   Unstable angina (HCC)    Hypertensive heart disease 07/14/2015   Lower extremity edema 07/14/2015   Aortic stenosis 01/09/2015   OSA on CPAP 12/12/2014   Hypersomnia with sleep apnea 12/12/2014   Obstructive sleep apnea (adult) (pediatric) 02/04/2014   Hypersomnia, persistent 02/04/2014   Obesity 02/04/2014   Mitral regurgitation and aortic stenosis 01/02/2014   DOE (dyspnea on exertion) 11/26/2013   Gout 08/12/2011   Hypertension 08/12/2011   High cholesterol 08/12/2011   OSA (obstructive sleep apnea) 08/12/2011   Diabetes type 2, uncontrolled 08/12/2011   Past  Medical History:  Diagnosis Date   Arthritis    knees (07/11/2017)   CKD (chronic kidney disease), stage III (HCC)    lov dr bernardino sanford 04-23-2020 on chart   Colon polyp    Complication of anesthesia    post op afibe after cabg x 2 2018   Coronary artery disease    a.  CABGx3 in 05/2017.   Gout    Heart murmur    mild, saw dr waddell 03-26-2020 f/u prn   High cholesterol    Hypertension    Mild aortic stenosis    Mild mitral regurgitation    OSA on CPAP    last used cpap 3 weeks ago   Phimosis    Postoperative atrial fibrillation (HCC) 06/2017   Sleep apnea    uses CPAP   Type II diabetes mellitus [facility])    Past Surgical History:  Procedure Laterality Date   CARDIAC CATHETERIZATION     CIRCUMCISION N/A 06/27/2020   Procedure: CIRCUMCISION ADULT;  Surgeon: Devere Lonni Righter, MD;  Location: [facility];  Service: Urology;  Laterality: N/A;   COLONOSCOPY  2008   CORONARY ARTERY BYPASS GRAFT N/A 06/24/2017   Procedure: CORONARY ARTERY BYPASS GRAFTING (CABG) x 3 ; Using Left Internal Mammary Artery and Right Great Saphenous Leg Vein Harvested Endoscopically;  Surgeon: Lucas Dorise POUR, MD;  Location: [facility] OR;  Service: Open Heart Surgery;  Laterality: N/A;   LEFT HEART CATH AND CORONARY ANGIOGRAPHY N/A 05/25/2017   Procedure: LEFT HEART CATH AND CORONARY ANGIOGRAPHY;  Surgeon: Darron Deatrice LABOR, MD;  Location: [facility] INVASIVE CV LAB;  Service: Cardiovascular;  Laterality: N/A;   TEE WITHOUT CARDIOVERSION N/A 06/24/2017   Procedure: TRANSESOPHAGEAL ECHOCARDIOGRAM (TEE);  Surgeon: Lucas Dorise POUR, MD;  Location[facility] [place] [facility];  Service: Open Heart Surgery;  Laterality: N/A;   Allergies[1] Prior to Admission medications  Medication Sig Start Date End Date Taking? Authorizing Provider  acetaminophen  (TYLENOL ) 325 MG tablet Take 650 mg by mouth every 6 (six) hours as needed for mild pain.   Yes [provider]  aspirin  EC 81 MG tablet Take 1 tablet (81 mg total) by  mouth daily. 09/02/17  Yes Dunn, Dayna N, PA-C  atorvastatin  (LIPITOR ) 80 MG tablet TAKE 1 TABLET BY MOUTH DAILY AT  6 PM 02/22/24  Yes Levora Reyes SAUNDERS, MD  chlorthalidone  (HYGROTON ) 25 MG tablet Take 0.5 tablets (12.5 mg total) by mouth every morning. 02/22/24  Yes Levora Reyes SAUNDERS, MD  glucose blood test strip Use as instructed 02/22/24  Yes Levora Reyes SAUNDERS, MD  hydrALAZINE  (APRESOLINE ) 50 MG tablet TAKE 1 TABLET BY MOUTH IN THE  MORNING AND 2 TABLETS BY MOUTH  IN THE EVENING 04/24/24  Yes Levora Reyes SAUNDERS, MD  irbesartan  (AVAPRO ) 300 MG tablet Take 1 tablet (300 mg total) by mouth daily. TAKE 1 TABLET BY MOUTH DAILY 11/25/23  Yes Levora Reyes SAUNDERS, MD  isosorbide  mononitrate (IMDUR ) 60 MG 24 hr tablet Take 1 tablet (60 mg total) by mouth at bedtime. 06/07/24  Yes Conte, Tessa N, PA-C  KERENDIA 10 MG TABS Take 1 tablet by mouth daily. 08/05/23  Yes [provider]  Lancets (ONETOUCH ULTRASOFT) lancets 1 each by Other route 1 day or 1 dose for 1 dose. Use as instructed 02/22/24 08/29/24 Yes Levora Reyes SAUNDERS, MD  metFORMIN  (GLUCOPHAGE ) 500 MG tablet Take 1 tablet (500 mg total) by mouth 2 (two) times daily with a meal. TAKE 1 TABLET BY MOUTH TWICE  DAILY WITH MEALS 05/22/24  Yes Levora Reyes SAUNDERS, MD  sitaGLIPtin  (JANUVIA ) 50 MG tablet Take 1 tablet (50 mg total) by mouth daily. 02/22/24  Yes Levora Reyes SAUNDERS, MD  doxycycline  (VIBRAMYCIN ) 100 MG capsule Take 1 capsule (100 mg total) by mouth 2 (two) times daily. Patient not taking: Reported on 08/29/2024 07/23/24   Christopher Savannah, PA-C  promethazine -dextromethorphan  (PROMETHAZINE -DM) 6.25-15 MG/5ML syrup Take 5 mLs by mouth 3 (three) times daily as needed for cough. Patient not taking: Reported on 08/29/2024 07/23/24   Christopher Savannah, PA-C   Social History   Socioeconomic History   Marital status: Married    Spouse name: Rock   Number of children: 3   Years of education: 16   Highest education level: Bachelor's degree (e.g., BA, AB, BS)   Occupational History   Occupation: Musician  Tobacco Use   Smoking status: Former    Current packs/day: 0.00    Average packs/day: 2.0 packs/day for 15.0 years (30.0 ttl pk-yrs)    Types: Cigarettes    Start date: 07/27/1983    Quit date: 07/26/1998    Years since quitting: 26.1   Smokeless tobacco: Never  Vaping Use   Vaping status: Never Used  Substance and Sexual Activity   Alcohol use: Not Currently   Drug use: Never   Sexual activity: Not Currently  Other Topics Concern   Not on file  Social History Narrative         Social Drivers of Health   Tobacco Use: Medium Risk (08/29/2024)   Patient History    Smoking Tobacco Use: Former    Smokeless Tobacco Use: Never    Passive Exposure: Not on Actuary Strain: Low Risk (08/25/2024)   Overall Financial Resource Strain (CARDIA)    Difficulty of Paying Living Expenses: Not hard at all  Food Insecurity: No Food Insecurity (08/25/2024)   Epic    Worried About Programme Researcher, Broadcasting/film/video in the Last Year: Never true    Ran Out of Food in the Last Year: Never true  Transportation Needs: No Transportation Needs (08/25/2024)   Epic    Lack of Transportation (Medical): No    Lack of Transportation (Non-Medical): No  Physical Activity: Inactive (08/25/2024)   Exercise Vital Sign    Days of Exercise per Week: 0 days    Minutes of Exercise per Session: Not on file  Stress: No Stress Concern Present (08/25/2024)   [employer] of Occupational Health - Occupational Stress Questionnaire    Feeling of Stress: Not at all  Social Connections: Socially Integrated (08/25/2024)   Social Connection and Isolation Panel    Frequency of Communication with Friends and Family: More than three times a week    Frequency of Social Gatherings with Friends and Family: Twice a week    Attends Religious Services: 1 to 4 times per year    Active Member of Clubs or Organizations: Yes    Attends Banker Meetings: More than 4 times per  year    Marital Status: Married  Catering Manager Violence: Not At Risk (01/10/2024)   Epic    Fear of Current or Ex-Partner: No    Emotionally Abused: No    Physically Abused: No    Sexually Abused: No  Depression (PHQ2-9): Low Risk (08/29/2024)   Depression (PHQ2-9)    PHQ-2 Score: 0  Alcohol Screen: Low Risk (01/10/2024)   Alcohol Screen    Last Alcohol Screening Score (AUDIT): 0  Housing: Low Risk (08/25/2024)   Epic    Unable to Pay for Housing in the Last Year: No    Number of Times Moved in the Last Year: 0    Homeless in the Last Year: No  Utilities: Not At Risk (01/10/2024)   Epic    Threatened with loss of utilities: No  Health Literacy: Adequate Health Literacy (01/10/2024)   B1300 Health Literacy    Frequency of need for help with medical instructions: Never    Review of Systems 13 point review of systems per patient health survey noted.  Negative other than as indicated above or in HPI.    Objective:   Vitals:   08/29/24 1311  BP: 130/80  Pulse: 80  Resp: 18  Temp: 98.1 F (36.7 C)  TempSrc: Temporal  SpO2: 97%  Weight: 183 lb 9.6 oz (83.3 kg)  Height: 5' 2 (1.575 m)   {Vitals History (Optional):23777}  Physical Exam Vitals reviewed.  Constitutional:      Appearance: He is well-developed.  HENT:     Head: Normocephalic and atraumatic.     Right Ear: External ear normal.     Left Ear: External ear normal.  Eyes:     Conjunctiva/sclera: Conjunctivae normal.     Pupils: Pupils are equal, round, and reactive to light.  Neck:     Thyroid : No thyromegaly.     Vascular: No carotid bruit or JVD.  Cardiovascular:     Rate and Rhythm: Normal rate and regular rhythm.     Heart sounds: Normal heart sounds. No murmur heard. Pulmonary:     Effort: Pulmonary effort is normal. No respiratory distress.     Breath sounds: Normal breath sounds. No wheezing or rales.  Abdominal:     General: There is no distension.     Palpations: Abdomen is soft.      Tenderness: There is no abdominal tenderness.  Musculoskeletal:        General: No tenderness.     Cervical back: Normal range of motion and neck supple.     Right lower leg: No edema.     Left lower leg: No edema.     Comments: Slight discomfort in the right shoulder with terminal extension, abduction.  Lymphadenopathy:     Cervical: No cervical adenopathy.  Skin:    General: Skin is warm and dry.  Neurological:     Mental Status: He is alert and oriented to person, place, and time.     Deep Tendon Reflexes: Reflexes are normal and symmetric.  Psychiatric:  Mood and Affect: Mood normal.        Behavior: Behavior normal.        Assessment & Plan:  Antonio Wells is a 79 y.o. male . Annual physical exam  Type 2 diabetes mellitus with chronic kidney disease, without long-term current use of insulin , unspecified CKD stage (HCC) - Plan: Hemoglobin A1c, Comprehensive metabolic panel with GFR, metFORMIN  (GLUCOPHAGE ) 500 MG tablet, sitaGLIPtin  (JANUVIA ) 50 MG tablet  S/P CABG x 3 - Plan: hydrALAZINE  (APRESOLINE ) 50 MG tablet  Coronary artery disease involving native heart without angina pectoris, unspecified vessel or lesion type - Plan: Comprehensive metabolic panel with GFR, Lipid panel, atorvastatin  (LIPITOR ) 80 MG tablet  Chronic diastolic CHF (congestive heart failure), NYHA class 2 (HCC) - Plan: hydrALAZINE  (APRESOLINE ) 50 MG tablet  Hyperlipidemia, unspecified hyperlipidemia type - Plan: hydrALAZINE  (APRESOLINE ) 50 MG tablet, Comprehensive metabolic panel with GFR, Lipid panel  Essential hypertension - Plan: hydrALAZINE  (APRESOLINE ) 50 MG tablet, Comprehensive metabolic panel with GFR, Lipid panel, chlorthalidone  (HYGROTON ) 25 MG tablet, irbesartan  (AVAPRO ) 300 MG tablet  Hepatic steatosis - Plan: Comprehensive metabolic panel with GFR   Meds ordered this encounter  Medications   hydrALAZINE  (APRESOLINE ) 50 MG tablet    Sig: TAKE 1 TABLET BY MOUTH IN THE  MORNING  AND 2 TABLETS BY MOUTH  IN THE EVENING    Dispense:  270 tablet    Refill:  3    Requesting 1 year supply   atorvastatin  (LIPITOR ) 80 MG tablet    Sig: TAKE 1 TABLET BY MOUTH DAILY AT  6 PM    Dispense:  100 tablet    Refill:  1    Please send a replace/new response with 100-Day Supply if appropriate to maximize member benefit. Requesting 1 year supply.   chlorthalidone  (HYGROTON ) 25 MG tablet    Sig: Take 0.5 tablets (12.5 mg total) by mouth every morning.    Dispense:  50 tablet    Refill:  2    Please send a replace/new response with 100-Day Supply if appropriate to maximize member benefit. Requesting 1 year supply.   irbesartan  (AVAPRO ) 300 MG tablet    Sig: Take 1 tablet (300 mg total) by mouth daily. TAKE 1 TABLET BY MOUTH DAILY    Dispense:  100 tablet    Refill:  3    Please send a replace/new response with 100-Day Supply if appropriate to maximize member benefit. Requesting 1 year supply.   metFORMIN  (GLUCOPHAGE ) 500 MG tablet    Sig: Take 1 tablet (500 mg total) by mouth 2 (two) times daily with a meal. TAKE 1 TABLET BY MOUTH TWICE  DAILY WITH MEALS    Dispense:  180 tablet    Refill:  3   sitaGLIPtin  (JANUVIA ) 50 MG tablet    Sig: Take 1 tablet (50 mg total) by mouth daily.    Dispense:  100 tablet    Refill:  2    Please send a replace/new response with 100-Day Supply if appropriate to maximize member benefit. Requesting 1 year supply.   Patient Instructions  Thank you for coming in today. No change in medications at this time, but I did provide some information below on Rybelsus if you would like to consider that medication in place of Januvia .  Let me know.  If there are any concerns on your bloodwork, I will let you know.  If shoulder discomfort does not improve with some range of motion/stretches or changing position with sleep, please see me within  the next week or 2.  Take care!  Semaglutide Tablets What is this medication? SEMAGLUTIDE (SEM a GLOO tide) treats type  2 diabetes. It works by increasing insulin  levels in your body, which decreases your blood sugar (glucose). It also reduces the amount of sugar released into your blood and slows down your digestion. It may also be used to lower the risk of heart attack and stroke in people with type 2 diabetes. Changes to diet are often combined with this medication. This medicine may be used for other purposes; ask your health care provider or pharmacist if you have questions. COMMON BRAND NAME(S): Rybelsus, Rybelsus (Formulation R2) What should I tell my care team before I take this medication? They need to know if you have any of these conditions: Endocrine tumors (MEN 2) or if someone in your family had these tumors Eye disease History of pancreatitis Kidney disease Stomach or intestine problems Thyroid  cancer or if someone in your family had thyroid  cancer Vision problems An unusual or allergic reaction to semaglutide, other medications, foods, dyes, or preservatives Pregnant or trying to get pregnant Breast-feeding How should I use this medication? Take this medication by mouth with a sip of water (no more than 4 ounces). Take it in the morning right after waking up every day. Take it on an empty stomach, at least 30 minutes before food, drink, or other medications. Do not cut, crush, or chew this medication. Swallow the tablets whole. Keep taking it unless your care team tells you to stop. A special MedGuide will be given to you by the pharmacist with each prescription and refill. Be sure to read this information carefully each time. Talk to your care team about the use of this medication in children. Special care may be needed. Overdosage: If you think you have taken too much of this medicine contact a poison control center or emergency room at once. NOTE: This medicine is only for you. Do not share this medicine with others. What if I miss a dose? If you miss a dose, skip it. Take your next dose at  the normal time. Do not take extra or 2 doses at the same time to make up for the missed dose. What may interact with this medication? Digoxin Levothyroxine Warfarin Some medications may affect your blood sugar levels or hide the symptoms of low blood sugar (hypoglycemia). Talk with your care team about all the medications you take. They may suggest changes to your insulin  dose or checking your blood sugar levels more often. Medications that may affect your blood sugar levels include: Alcohol Certain antibiotics, such as ciprofloxacin, levofloxacin , sulfamethoxazole; trimethoprim Certain medications for blood pressure or heart disease, such as benazepril, enalapril, lisinopril, losartan , valsartan Certain medications for mental health conditions, such as fluoxetine or olanzapine Diuretics, such as hydrochlorothiazide  (HCTZ) Estrogen and progestin hormones Other medications for diabetes Steroid medications, such as prednisone  or cortisone Testosterone Thyroid  hormones Medications that may mask symptoms of low blood sugar include: Beta blockers, such as atenolol, metoprolol , propranolol Clonidine Guanethidine Reserpine This list may not describe all possible interactions. Give your health care provider a list of all the medicines, herbs, non-prescription drugs, or dietary supplements you use. Also tell them if you smoke, drink alcohol, or use illegal drugs. Some items may interact with your medicine. What should I watch for while using this medication? Visit your care team for regular checks on your progress. Tell your care team if your symptoms do not start to get better or  if they get worse. You may need blood work done while you are taking this medication. Your care team will monitor your HbA1C (A1C). This test shows what your average blood sugar (glucose) level was over the past 2 to 3 months. Know the symptoms of low blood sugar and know how to treat it. Always carry a source of quick  sugar with you. Examples include hard sugar candy or glucose tablets. Make sure others know that you can choke if you eat or drink if your blood sugar is too low and you are unable to care for yourself. Get medical help at once. Tell your care team if you have high blood sugar. Your medication dose may change if your body is under stress. Some types of stress that may affect your blood sugar include fever, infection, and surgery. Wear a medical ID bracelet or chain. Carry a card that describes your condition. List the medications and doses you take on the card. Talk to your care team about your risk of cancer. You may be more at risk for certain types of cancer if you take this medication. Talk to your care team right away if you have a lump or swelling in your neck, hoarseness that does not go away, trouble swallowing, shortness of breath, or trouble breathing. Make sure you stay hydrated while taking this medication. Drink water often. Eat fruits and veggies that have a high water content. Drink more water when it is hot or you are active. Talk to your care team right away if you have fever, infection, vomiting, diarrhea, or if you sweat a lot while taking this medication. The loss of too much body fluid may make it dangerous for you to take this medication. If you are going to need surgery or a procedure, tell your care team that you are taking this medication. Do not take this medication without first talking to your care team if you may be or could become pregnant. Your care team can help you find the option that works for you. Weight loss is not recommended during pregnancy. Maintaining healthy blood sugar levels can help reduce the risk of pregnancy complications. Talk to your care team if you are breastfeeding. When recommended, this medication may be taken. Its use during breastfeeding has not been well studied. Lactation may help lower your blood sugar levels. Your care team may recommend changes to  your treatment plan. What side effects may I notice from receiving this medication? Side effects that you should report to your care team as soon as possible: Allergic reactions--skin rash, itching, hives, swelling of the face, lips, tongue, or throat Change in vision Dehydration--increased thirst, dry mouth, feeling faint or lightheaded, headache, dark yellow or brown urine Gallbladder problems--severe stomach pain, nausea, vomiting, fever Kidney injury--decrease in the amount of urine, swelling of the ankles, hands, or feet Pancreatitis--severe stomach pain that spreads to your back or gets worse after eating or when touched, fever, nausea, vomiting Thyroid  cancer--new mass or lump in the neck, pain or trouble swallowing, trouble breathing, hoarseness Side effects that usually do not require medical attention (report these to your care team if they continue or are bothersome): Constipation Diarrhea Loss of appetite Nausea Stomach pain Vomiting This list may not describe all possible side effects. Call your doctor for medical advice about side effects. You may report side effects to FDA at 1-800-FDA-1088. Where should I keep my medication? Keep out of the reach of children and pets. Store at room temperature between  20 and 25 degrees C (68 and 77 degrees F). Keep this medication in the original container. Protect from moisture. Keep the container tightly closed. Get rid of any unused medication after the expiration date. To get rid of medications that are no longer needed or have expired: Take the medication to a medication take-back program. Check with your pharmacy or law enforcement to find a location. If you cannot return the medication, check the label or package insert to see if the medication should be thrown out in the garbage or flushed down the toilet. If you are not sure, ask your care team. If it is safe to put it in the trash, take the medication out of the container. Mix the  medication with cat litter, dirt, coffee grounds, or other unwanted substance. Seal the mixture in a bag or container. Put it in the trash. NOTE: This sheet is a summary. It may not cover all possible information. If you have questions about this medicine, talk to your doctor, pharmacist, or health care provider.  2025 Elsevier/Gold Standard (2024-05-16 00:00:00)  Preventive Care 65 Years and Older, Male Preventive care refers to lifestyle choices and visits with your health care provider that can promote health and wellness. Preventive care visits are also called wellness exams. What can I expect for my preventive care visit? Counseling During your preventive care visit, your health care provider may ask about your: Medical history, including: Past medical problems. Family medical history. History of falls. Current health, including: Emotional well-being. Home life and relationship well-being. Sexual activity. Memory and ability to understand (cognition). Lifestyle, including: Alcohol, nicotine or tobacco, and drug use. Access to firearms. Diet, exercise, and sleep habits. Work and work astronomer. Sunscreen use. Safety issues such as seatbelt and bike helmet use. Physical exam Your health care provider will check your: Height and weight. These may be used to calculate your BMI (body mass index). BMI is a measurement that tells if you are at a healthy weight. Waist circumference. This measures the distance around your waistline. This measurement also tells if you are at a healthy weight and may help predict your risk of certain diseases, such as type 2 diabetes and high blood pressure. Heart rate and blood pressure. Body temperature. Skin for abnormal spots. What immunizations do I need?  Vaccines are usually given at various ages, according to a schedule. Your health care provider will recommend vaccines for you based on your age, medical history, and lifestyle or other factors,  such as travel or where you work. What tests do I need? Screening Your health care provider may recommend screening tests for certain conditions. This may include: Lipid and cholesterol levels. Diabetes screening. This is done by checking your blood sugar (glucose) after you have not eaten for a while (fasting). Hepatitis C test. Hepatitis B test. HIV (human immunodeficiency virus) test. STI (sexually transmitted infection) testing, if you are at risk. Lung cancer screening. Colorectal cancer screening. Prostate cancer screening. Abdominal aortic aneurysm (AAA) screening. You may need this if you are a current or former smoker. Talk with your health care provider about your test results, treatment options, and if necessary, the need for more tests. Follow these instructions at home: Eating and drinking  Eat a diet that includes fresh fruits and vegetables, whole grains, lean protein, and low-fat dairy products. Limit your intake of foods with high amounts of sugar, saturated fats, and salt. Take vitamin and mineral supplements as recommended by your health care provider. Do not drink  alcohol if your health care provider tells you not to drink. If you drink alcohol: Limit how much you have to 0-2 drinks a day. Know how much alcohol is in your drink. In the [place] one drink equals one 12 oz bottle of beer (355 mL), one 5 oz glass of wine (148 mL), or one 1 oz glass of hard liquor (44 mL). Lifestyle Brush your teeth every morning and night with fluoride toothpaste. Floss one time each day. Exercise for at least 30 minutes 5 or more days each week. Do not use any products that contain nicotine or tobacco. These products include cigarettes, chewing tobacco, and vaping devices, such as e-cigarettes. If you need help quitting, ask your health care provider. Do not use drugs. If you are sexually active, practice safe sex. Use a condom or other form of protection to prevent STIs. Take aspirin   only as told by your health care provider. Make sure that you understand how much to take and what form to take. Work with your health care provider to find out whether it is safe and beneficial for you to take aspirin  daily. Ask your health care provider if you need to take a cholesterol-lowering medicine (statin). Find healthy ways to manage stress, such as: Meditation, yoga, or listening to music. Journaling. Talking to a trusted person. Spending time with friends and family. Safety Always wear your seat belt while driving or riding in a vehicle. Do not drive: If you have been drinking alcohol. Do not ride with someone who has been drinking. When you are tired or distracted. While texting. If you have been using any mind-altering substances or drugs. Wear a helmet and other protective equipment during sports activities. If you have firearms in your house, make sure you follow all gun safety procedures. Minimize exposure to UV radiation to reduce your risk of skin cancer. What's next? Visit your health care provider once a year for an annual wellness visit. Ask your health care provider how often you should have your eyes and teeth checked. Stay up to date on all vaccines. This information is not intended to replace advice given to you by your health care provider. Make sure you discuss any questions you have with your health care provider. Document Revised: 01/07/2021 Document Reviewed: 01/07/2021 Elsevier Patient Education  2024 Elsevier Inc.     Signed,   Reyes Pines, MD [facility] 08/29/24 2:09 PM       [1]  Allergies Allergen Reactions   Penicillins Other (See Comments)    Has patient had a PCN reaction causing immediate rash, facial/tongue/throat swelling, SOB or lightheadedness with hypotension: No Has patient had a PCN reaction causing severe rash involving mucus membranes or skin necrosis: No Has  patient had a PCN reaction that required hospitalization: No Has patient had a PCN reaction occurring within the last 10 years: No If all of the above answers are NO, then may proceed with Cephalosporin use.   Passed out ? SYNCOPE ?   Beta Adrenergic Blockers Other (See Comments)    Do not take beta blockers causes heart block with coreg    Lisinopril Cough   Amiodarone  Nausea And Vomiting   Amlodipine  Other (See Comments)    Causes dizzines   Cortisone Nausea And Vomiting and Other (See Comments)    HICCUPS   Oxycodone  Other (See Comments)    hallucinations   Tramadol  Nausea And Vomiting and Other (See Comments)    HICCUPS  Zofran  [Ondansetron  Hcl] Other (See Comments)    Has an interaction with another medication pt takes    "

## 2024-08-31 ENCOUNTER — Ambulatory Visit: Payer: Self-pay | Admitting: Family Medicine

## 2024-08-31 DIAGNOSIS — R7989 Other specified abnormal findings of blood chemistry: Secondary | ICD-10-CM

## 2024-08-31 NOTE — Telephone Encounter (Signed)
 Called patient back and relayed Dr. Valorie message  Scheduled lab visit  Ordered future lab

## 2024-08-31 NOTE — Telephone Encounter (Signed)
 Noted.  I did discuss his recent nephrology visit at his appointment.  Creatinine was 1.69 at nephrology on January 22 so this is a slight increase from that reading.  I would still recommend a repeat test to make sure it is not trending upwards.  If he would prefer to discuss this result with his nephrology office for a change in plan, that is fine as well.

## 2024-09-05 ENCOUNTER — Other Ambulatory Visit: Payer: Medicare (Managed Care)

## 2025-02-27 ENCOUNTER — Ambulatory Visit: Payer: Medicare (Managed Care) | Admitting: Family Medicine
